# Patient Record
Sex: Male | Born: 1965
Health system: Southern US, Community
[De-identification: ages and names within clinical notes are randomized; demographics above are authoritative.]

## PROBLEM LIST (undated history)

## (undated) DIAGNOSIS — I270 Primary pulmonary hypertension: Secondary | ICD-10-CM

## (undated) DIAGNOSIS — I369 Nonrheumatic tricuspid valve disorder, unspecified: Secondary | ICD-10-CM

## (undated) DIAGNOSIS — N179 Acute kidney failure, unspecified: Secondary | ICD-10-CM

## (undated) DIAGNOSIS — F419 Anxiety disorder, unspecified: Secondary | ICD-10-CM

## (undated) DIAGNOSIS — I059 Rheumatic mitral valve disease, unspecified: Secondary | ICD-10-CM

## (undated) DIAGNOSIS — E119 Type 2 diabetes mellitus without complications: Secondary | ICD-10-CM

## (undated) DIAGNOSIS — K219 Gastro-esophageal reflux disease without esophagitis: Secondary | ICD-10-CM

## (undated) DIAGNOSIS — Z8679 Personal history of other diseases of the circulatory system: Secondary | ICD-10-CM

## (undated) DIAGNOSIS — E039 Hypothyroidism, unspecified: Secondary | ICD-10-CM

## (undated) DIAGNOSIS — I10 Essential (primary) hypertension: Secondary | ICD-10-CM

## (undated) DIAGNOSIS — Z8619 Personal history of other infectious and parasitic diseases: Secondary | ICD-10-CM

## (undated) DIAGNOSIS — L210 Seborrhea capitis: Secondary | ICD-10-CM

## (undated) HISTORY — DX: Acute kidney failure, unspecified: N17.9

## (undated) HISTORY — PX: MECKEL DIVERTICULUM EXCISION: SHX314

## (undated) HISTORY — DX: Personal history of other infectious and parasitic diseases: Z86.19

## (undated) HISTORY — PX: EYE SURGERY: SHX253

## (undated) HISTORY — DX: Personal history of other diseases of the circulatory system: Z86.79

## (undated) HISTORY — DX: Primary pulmonary hypertension: I27.0

## (undated) HISTORY — PX: REFRACTIVE SURGERY: SHX103

## (undated) HISTORY — DX: Rheumatic mitral valve disease, unspecified: I05.9

## (undated) HISTORY — DX: Nonrheumatic tricuspid valve disorder, unspecified: I36.9

## (undated) HISTORY — PX: HEMORROIDECTOMY: SUR656

## (undated) HISTORY — DX: Seborrhea capitis: L21.0

## (undated) HISTORY — PX: APPENDECTOMY: SHX54

---

## 1998-03-26 DIAGNOSIS — F410 Panic disorder [episodic paroxysmal anxiety] without agoraphobia: Secondary | ICD-10-CM | POA: Insufficient documentation

## 2006-01-13 DIAGNOSIS — H409 Unspecified glaucoma: Secondary | ICD-10-CM | POA: Insufficient documentation

## 2006-06-13 LAB — TSH: TSH: 1.72 u[IU]/mL (ref <=5.90)

## 2006-07-16 DIAGNOSIS — Z85828 Personal history of other malignant neoplasm of skin: Secondary | ICD-10-CM | POA: Insufficient documentation

## 2009-01-17 HISTORY — PX: OTHER SURGICAL HISTORY: SHX169

## 2009-06-14 HISTORY — PX: OTHER SURGICAL HISTORY: SHX169

## 2009-11-24 ENCOUNTER — Inpatient Hospital Stay: Payer: Self-pay | Admitting: Internal Medicine

## 2010-01-05 ENCOUNTER — Ambulatory Visit: Payer: Self-pay | Admitting: Family Medicine

## 2010-04-04 ENCOUNTER — Emergency Department: Payer: Self-pay | Admitting: Emergency Medicine

## 2010-04-09 ENCOUNTER — Emergency Department: Payer: Self-pay | Admitting: Emergency Medicine

## 2010-04-21 LAB — HEMOGLOBIN A1C: Hgb A1c MFr Bld: 10.4 % — AB (ref 4.0–6.0)

## 2010-04-21 LAB — BASIC METABOLIC PANEL
BUN: 33 mg/dL — AB (ref 4–21)
Creatinine: 4.2 mg/dL — AB (ref 0.6–1.3)
Glucose: 333 mg/dL
Potassium: 3.8 mmol/L (ref 3.4–5.3)
Sodium: 133 mmol/L — AB (ref 137–147)

## 2010-04-21 LAB — LIPID PANEL
Cholesterol: 205 mg/dL — AB (ref 0–200)
HDL: 43 mg/dL (ref 35–70)
LDL Cholesterol: 111 mg/dL
Triglycerides: 255 mg/dL — AB (ref 40–160)

## 2010-04-21 LAB — HEPATIC FUNCTION PANEL: ALT: 8 U/L — AB (ref 10–40)

## 2010-09-19 ENCOUNTER — Ambulatory Visit: Payer: Self-pay | Admitting: Vascular Surgery

## 2010-09-22 ENCOUNTER — Ambulatory Visit: Payer: Self-pay | Admitting: Vascular Surgery

## 2010-10-31 ENCOUNTER — Ambulatory Visit: Payer: Self-pay | Admitting: Nephrology

## 2010-11-06 ENCOUNTER — Ambulatory Visit: Payer: Self-pay | Admitting: Nephrology

## 2011-01-09 ENCOUNTER — Ambulatory Visit: Payer: Self-pay | Admitting: Family Medicine

## 2011-01-09 DIAGNOSIS — G4733 Obstructive sleep apnea (adult) (pediatric): Secondary | ICD-10-CM | POA: Insufficient documentation

## 2011-01-09 HISTORY — PX: OTHER SURGICAL HISTORY: SHX169

## 2011-05-09 ENCOUNTER — Ambulatory Visit: Payer: Self-pay | Admitting: Otolaryngology

## 2011-05-09 LAB — POTASSIUM: Potassium: 4.1 mmol/L (ref 3.5–5.1)

## 2011-05-14 LAB — PATHOLOGY REPORT

## 2011-10-30 DIAGNOSIS — Z201 Contact with and (suspected) exposure to tuberculosis: Secondary | ICD-10-CM | POA: Insufficient documentation

## 2011-12-21 ENCOUNTER — Ambulatory Visit (INDEPENDENT_AMBULATORY_CARE_PROVIDER_SITE_OTHER): Payer: Commercial Indemnity | Admitting: Cardiovascular Disease

## 2011-12-21 ENCOUNTER — Ambulatory Visit: Payer: Self-pay | Admitting: Specialist

## 2011-12-21 ENCOUNTER — Encounter: Payer: Self-pay | Admitting: Cardiovascular Disease

## 2011-12-21 VITALS — BP 138/78 | HR 100 | Ht 71.0 in | Wt 253.0 lb

## 2011-12-21 DIAGNOSIS — N186 End stage renal disease: Secondary | ICD-10-CM

## 2011-12-21 DIAGNOSIS — I6529 Occlusion and stenosis of unspecified carotid artery: Secondary | ICD-10-CM

## 2011-12-21 DIAGNOSIS — E785 Hyperlipidemia, unspecified: Secondary | ICD-10-CM

## 2011-12-21 DIAGNOSIS — E119 Type 2 diabetes mellitus without complications: Secondary | ICD-10-CM

## 2011-12-21 DIAGNOSIS — I10 Essential (primary) hypertension: Secondary | ICD-10-CM | POA: Insufficient documentation

## 2011-12-21 DIAGNOSIS — R079 Chest pain, unspecified: Secondary | ICD-10-CM

## 2011-12-21 DIAGNOSIS — R000 Tachycardia, unspecified: Secondary | ICD-10-CM | POA: Insufficient documentation

## 2011-12-21 MED ORDER — CARVEDILOL 6.25 MG PO TABS: 6.2500 mg | ORAL_TABLET | Freq: Two times a day (BID) | ORAL | Status: DC

## 2011-12-21 MED ORDER — NITROGLYCERIN 0.4 MG SL SUBL: 0.4000 mg | SUBLINGUAL_TABLET | SUBLINGUAL | Status: DC | PRN

## 2011-12-21 NOTE — Assessment & Plan Note (Signed)
Mild plaquing seen bilaterally. Will offer repeat ultrasound every several years

## 2011-12-21 NOTE — Assessment & Plan Note (Signed)
We have suggested he increase his carvedilol to 6.25 mg twice a day

## 2011-12-21 NOTE — Assessment & Plan Note (Signed)
Blood pressure is well controlled on today's visit. No changes made to the medications. 

## 2011-12-21 NOTE — Assessment & Plan Note (Signed)
Currently undergoing evaluation for renal transplant at [facility].

## 2011-12-21 NOTE — Assessment & Plan Note (Signed)
We have encouraged continued exercise, careful diet management in an effort to lose weight. 

## 2011-12-21 NOTE — Patient Instructions (Addendum)
You are doing well. Please increase the coreg to 6 mg twice a day Monitor your heart rate  Please call us if you have new issues that need to be addressed before your next appt.  Your physician wants you to follow-up in: 6 months.  You will receive a reminder letter in the mail two months in advance. If you don't receive a letter, please call our office to schedule the follow-up appointment.

## 2011-12-21 NOTE — Progress Notes (Signed)
Patient ID: James Moreno, male    DOB: October 13, 1965, 46 y.o.   MRN: [id number]  HPI Comments: James Moreno is a pleasant 46 year old gentleman with a history of diabetes, recurrent chest pain , no prior cardiac catheterization secondary to renal dysfunction hypertension , previous smoking history , hyperlipidemia,end-stage renal disease who is being evaluated for renal transplant at [facility] with recent stress test at [facility] earlier this year which was reportedly normal, who does peritoneal hemodialysis daily, who presents for evaluation of chest pain and to establish care.  He reports that several weeks ago, he was awoken from sleep with chest discomfort. Symptoms seemed to resolve without significant intervention. He has not had any further episodes since that time. He reports that he would like to go to the gym and start working out in an effort to lose weight.  He recently had an iron infusion He reports a decrease in his hemoglobin A1c from 8.8 to 7.3 He reports having chronic discomfort in his legs  Carotid studies done at an outside facility shows bilateral disease, mild, less than 39%  outside echocardiogram (done in [place])shows ejection fraction greater than 55%, severe concentric LVH, diastolic relaxation abnormality, mildly elevated right ventricular systolic pressures. No mention of LVH in the final summary    stress test in September 2000 was suboptimal as he was unable to achieve target heart rate   EKG shows normal sinus rhythm with rate 101 beats per minute, no significant ST-T wave changes   Outpatient Encounter Prescriptions as of 12/21/2011  Medication Sig Dispense Refill  . aspirin 81 MG tablet Take 81 mg by mouth daily.      . Calcium Acetate 667 MG TABS Take 667 mg by mouth 3 (three) times daily.      . cloNIDine (CATAPRES) 0.1 MG tablet Take 0.1 mg by mouth 2 (two) times daily as needed.       . insulin aspart protamine-insulin aspart (NOVOLOG 70/30) (70-30) 100 UNIT/ML  injection Inject 40 Units into the skin daily with supper.      . insulin glargine (LANTUS) 100 UNIT/ML injection Inject 50 Units into the skin at bedtime.      . insulin NPH (HUMULIN N,NOVOLIN N) 100 UNIT/ML injection Inject 60 Units into the skin at bedtime.      James Moreno Oil (SM MEGAKRILL PO) Take by mouth.      Marland Kitchen lisinopril (PRINIVIL,ZESTRIL) 5 MG tablet Take 5 mg by mouth 2 (two) times daily.      Marland Kitchen lovastatin (MEVACOR) 40 MG tablet Take 40 mg by mouth at bedtime.      Marland Kitchen omeprazole (PRILOSEC) 40 MG capsule Take 40 mg by mouth daily.      Marland Kitchen pyridOXINE (VITAMIN B-6) 100 MG tablet Take 100 mg by mouth daily.      . sevelamer (RENVELA) 800 MG tablet Take 800 mg by mouth 3 (three) times daily with meals.      . tadalafil (CIALIS) 5 MG tablet Take 5 mg by mouth daily as needed.      .  carvedilol (COREG) 3.125 MG tablet Take 3.125 mg by mouth 2 (two) times daily with a meal.      . nitroGLYCERIN (NITROSTAT) 0.4 MG SL tablet Place 1 tablet (0.4 mg total) under the tongue every 5 (five) minutes as needed for chest pain.  25 tablet  6     Review of Systems  Constitutional: Negative.   HENT: Negative.   Eyes: Negative.   Respiratory: Negative.  Cardiovascular: Positive for chest pain.  Gastrointestinal: Negative.   Musculoskeletal: Positive for myalgias and arthralgias.  Skin: Negative.   Neurological: Negative.   Hematological: Negative.   Psychiatric/Behavioral: Negative.   All other systems reviewed and are negative.    BP 138/78  Pulse 100  Ht 5\' 11"  (1.803 m)  Wt 253 lb (114.76 kg)  BMI 35.29 kg/m2  Physical Exam  Nursing note and vitals reviewed. Constitutional: He is oriented to person, place, and time. He appears well-developed and well-nourished.  HENT:  Head: Normocephalic.  Nose: Nose normal.  Mouth/Throat: Oropharynx is clear and moist.  Eyes: Conjunctivae normal are normal. Pupils are equal, round, and reactive to light.  Neck: Normal range of motion. Neck supple.  No JVD present.  Cardiovascular: Normal rate, regular rhythm, S1 normal, S2 normal, normal heart sounds and intact distal pulses.  Exam reveals no gallop and no friction rub.   No murmur heard. Pulmonary/Chest: Effort normal and breath sounds normal. No respiratory distress. He has no wheezes. He has no rales. He exhibits no tenderness.  Abdominal: Soft. Bowel sounds are normal. He exhibits no distension. There is no tenderness.       Peritoneal dialysis catheter in place  Musculoskeletal: Normal range of motion. He exhibits no edema and no tenderness.  Lymphadenopathy:    He has no cervical adenopathy.  Neurological: He is alert and oriented to person, place, and time. Coordination normal.  Skin: Skin is warm and dry. No rash noted. No erythema.  Psychiatric: He has a normal mood and affect. His behavior is normal. Judgment and thought content normal.           Assessment and Plan

## 2011-12-21 NOTE — Assessment & Plan Note (Signed)
Chest pain is somewhat atypical. No recent symptoms. Several prior stress test dating back to 2010, 2011 and 7 months ago at [facility]. No further testing at this time.

## 2011-12-21 NOTE — Assessment & Plan Note (Signed)
We have suggested he stay on his statin

## 2012-03-29 ENCOUNTER — Other Ambulatory Visit: Payer: Self-pay | Admitting: Cardiovascular Disease

## 2012-03-31 ENCOUNTER — Other Ambulatory Visit: Payer: Self-pay | Admitting: *Deleted

## 2012-03-31 MED ORDER — CARVEDILOL 6.25 MG PO TABS: 6.2500 mg | ORAL_TABLET | Freq: Two times a day (BID) | ORAL | Status: DC

## 2012-03-31 NOTE — Telephone Encounter (Signed)
Refilled Coreg sent to [facility].

## 2012-05-10 ENCOUNTER — Other Ambulatory Visit: Payer: Self-pay

## 2012-07-21 ENCOUNTER — Encounter: Payer: Self-pay | Admitting: Cardiovascular Disease

## 2012-07-21 ENCOUNTER — Ambulatory Visit (INDEPENDENT_AMBULATORY_CARE_PROVIDER_SITE_OTHER): Payer: Commercial Indemnity | Admitting: Cardiovascular Disease

## 2012-07-21 VITALS — BP 160/80 | HR 101 | Ht 71.0 in | Wt 261.0 lb

## 2012-07-21 DIAGNOSIS — N186 End stage renal disease: Secondary | ICD-10-CM

## 2012-07-21 DIAGNOSIS — E785 Hyperlipidemia, unspecified: Secondary | ICD-10-CM

## 2012-07-21 DIAGNOSIS — I129 Hypertensive chronic kidney disease with stage 1 through stage 4 chronic kidney disease, or unspecified chronic kidney disease: Secondary | ICD-10-CM

## 2012-07-21 DIAGNOSIS — E119 Type 2 diabetes mellitus without complications: Secondary | ICD-10-CM

## 2012-07-21 DIAGNOSIS — R000 Tachycardia, unspecified: Secondary | ICD-10-CM

## 2012-07-21 DIAGNOSIS — R079 Chest pain, unspecified: Secondary | ICD-10-CM

## 2012-07-21 DIAGNOSIS — I10 Essential (primary) hypertension: Secondary | ICD-10-CM

## 2012-07-21 MED ORDER — CARVEDILOL 25 MG PO TABS: 25.0000 mg | ORAL_TABLET | Freq: Two times a day (BID) | ORAL | Status: DC

## 2012-07-21 NOTE — Assessment & Plan Note (Signed)
He is indicated he is interested in gastric bypass surgery. Given his ability to exert himself without any symptoms of chest pain, shortness of breath, he would be acceptable risk. No further testing needed.

## 2012-07-21 NOTE — Assessment & Plan Note (Signed)
Currently on peritoneal dialysis daily

## 2012-07-21 NOTE — Assessment & Plan Note (Signed)
He denies any recent episodes of chest discomfort with exertion

## 2012-07-21 NOTE — Patient Instructions (Addendum)
You are doing well. Please increase the coreg to 25 mg twice a day  Please call us if you have new issues that need to be addressed before your next appt.  Your physician wants you to follow-up in: 6 months.  You will receive a reminder letter in the mail two months in advance. If you don't receive a letter, please call our office to schedule the follow-up appointment.

## 2012-07-21 NOTE — Assessment & Plan Note (Signed)
We have encouraged him to stay on his statin

## 2012-07-21 NOTE — Assessment & Plan Note (Signed)
We have encouraged continued exercise, careful diet management in an effort to lose weight. Gastric bypass surgery would certainly help his diabetes

## 2012-07-21 NOTE — Assessment & Plan Note (Signed)
Blood pressure slightly high today. We'll increase his carvedilol up to 25 mg twice a day. He'll continue to monitor his heart rate and blood pressure for [place].Marland Kitchen

## 2012-07-21 NOTE — Progress Notes (Signed)
Patient ID: James Moreno, male    DOB: 20-Jul-1965, 47 y.o.   MRN: [id number]  HPI Comments: James Moreno is a pleasant 47 year old gentleman with a history of diabetes, recurrent chest pain , no prior cardiac catheterization secondary to renal dysfunction hypertension , previous smoking history , hyperlipidemia,end-stage renal disease who is being evaluated for renal transplant at [facility] with recent stress test at [facility] earlier this year which was reportedly normal, who does peritoneal hemodialysis daily, previously evaluated for chest pain, presenting for routine followup.  Since his last clinic visit, he denies any recurrent chest pain. No chest pain with exertion and overall has been feeling well. Continues to do peritoneal dialysis daily. Dry weight 255 pounds. Smokes three quarters of a pack a day. Blood pressure at home is been well-controlled. He is interested in gastric bypass surgery to help improve his diabetes and sleep apnea.  reports that his heart rate continues to be elevated on a chronic basis    previously required iron infusions  Previous hemoglobin A1c was greater than 7 He reports having chronic discomfort in his legs  Carotid studies done at an outside facility shows bilateral disease, mild, less than 39%  outside echocardiogram (done in [place])shows ejection fraction greater than 55%, severe concentric LVH, diastolic relaxation abnormality, mildly elevated right ventricular systolic pressures. No mention of LVH in the final summary    stress test in September 2010 was suboptimal as he was unable to achieve target heart rate  EKG shows normal sinus rhythm with rate 101 beats per minute, no significant ST-T wave changes   Outpatient Encounter Prescriptions as of 07/21/2012  Medication Sig Dispense Refill  . aspirin 81 MG tablet Take 81 mg by mouth daily.      . Calcium Acetate 667 MG TABS Take 667 mg by mouth 3 (three) times daily.      . carvedilol (COREG) 25 MG tablet Take 1  tablet (25 mg total) by mouth 2 (two) times daily with a meal.  60 tablet  6  . insulin aspart protamine-insulin aspart (NOVOLOG 70/30) (70-30) 100 UNIT/ML injection Inject 60 Units into the skin daily with supper.       . insulin glargine (LANTUS) 100 UNIT/ML injection Inject 50 Units into the skin at bedtime.      . insulin NPH (HUMULIN N,NOVOLIN N) 100 UNIT/ML injection Inject 60 Units into the skin at bedtime.      [name] (SM MEGAKRILL PO) Take by mouth.      Marland Kitchen lisinopril (PRINIVIL,ZESTRIL) 5 MG tablet Take 5 mg by mouth 2 (two) times daily.      Marland Kitchen lovastatin (MEVACOR) 40 MG tablet Take 40 mg by mouth at bedtime.      Marland Kitchen omeprazole (PRILOSEC) 40 MG capsule Take 40 mg by mouth daily.      . sevelamer (RENVELA) 800 MG tablet Take 800 mg by mouth 3 (three) times daily with meals.      . tadalafil (CIALIS) 5 MG tablet Take 5 mg by mouth daily as needed.      . [DISCONTINUED] carvedilol (COREG) 6.25 MG tablet Take 1 tablet (6.25 mg total) by mouth 2 (two) times daily with a meal.  60 tablet  3  . [DISCONTINUED] cloNIDine (CATAPRES) 0.1 MG tablet Take 0.1 mg by mouth 2 (two) times daily as needed.       . [DISCONTINUED] nitroGLYCERIN (NITROSTAT) 0.4 MG SL tablet Place 1 tablet (0.4 mg total) under the tongue every 5 (five) minutes  as needed for chest pain.  25 tablet  6  . [DISCONTINUED] pyridOXINE (VITAMIN B-6) 100 MG tablet Take 100 mg by mouth daily.       No facility-administered encounter medications on file as of 07/21/2012.     Review of Systems  Constitutional: Negative.   HENT: Negative.   Eyes: Negative.   Respiratory: Negative.   Gastrointestinal: Negative.   Musculoskeletal: Positive for myalgias and arthralgias.  Skin: Negative.   Neurological: Negative.   Psychiatric/Behavioral: Negative.   All other systems reviewed and are negative.    BP 160/80  Pulse 101  Ht 5\' 11"  (1.803 m)  Wt 261 lb (118.389 kg)  BMI 36.42 kg/m2 Blood pressure improved on recheck down to  150/75  Physical Exam  Nursing note and vitals reviewed. Constitutional: He is oriented to person, place, and time. He appears well-developed and well-nourished.  HENT:  Head: Normocephalic.  Nose: Nose normal.  Mouth/Throat: Oropharynx is clear and moist.  Eyes: Conjunctivae are normal. Pupils are equal, round, and reactive to light.  Neck: Normal range of motion. Neck supple. No JVD present.  Cardiovascular: Normal rate, regular rhythm, S1 normal, S2 normal, normal heart sounds and intact distal pulses.  Exam reveals no gallop and no friction rub.   No murmur heard. Pulmonary/Chest: Effort normal and breath sounds normal. No respiratory distress. He has no wheezes. He has no rales. He exhibits no tenderness.  Abdominal: Soft. Bowel sounds are normal. He exhibits no distension. There is no tenderness.  Peritoneal dialysis catheter in place  Musculoskeletal: Normal range of motion. He exhibits no edema and no tenderness.  Lymphadenopathy:    He has no cervical adenopathy.  Neurological: He is alert and oriented to person, place, and time. Coordination normal.  Skin: Skin is warm and dry. No rash noted. No erythema.  Psychiatric: He has a normal mood and affect. His behavior is normal. Judgment and thought content normal.      Assessment and Plan

## 2012-11-17 DIAGNOSIS — N186 End stage renal disease: Secondary | ICD-10-CM | POA: Insufficient documentation

## 2012-11-28 ENCOUNTER — Other Ambulatory Visit: Payer: Self-pay

## 2012-11-28 ENCOUNTER — Ambulatory Visit (INDEPENDENT_AMBULATORY_CARE_PROVIDER_SITE_OTHER): Payer: Medicare Other | Admitting: Physician Assistant

## 2012-11-28 ENCOUNTER — Encounter: Payer: Self-pay | Admitting: Physician Assistant

## 2012-11-28 ENCOUNTER — Other Ambulatory Visit (INDEPENDENT_AMBULATORY_CARE_PROVIDER_SITE_OTHER): Payer: BC Managed Care – PPO

## 2012-11-28 VITALS — BP 138/82 | HR 97 | Ht 71.0 in | Wt 243.5 lb

## 2012-11-28 DIAGNOSIS — I658 Occlusion and stenosis of other precerebral arteries: Secondary | ICD-10-CM

## 2012-11-28 DIAGNOSIS — E1129 Type 2 diabetes mellitus with other diabetic kidney complication: Secondary | ICD-10-CM

## 2012-11-28 DIAGNOSIS — N529 Male erectile dysfunction, unspecified: Secondary | ICD-10-CM

## 2012-11-28 DIAGNOSIS — R079 Chest pain, unspecified: Secondary | ICD-10-CM

## 2012-11-28 DIAGNOSIS — N186 End stage renal disease: Secondary | ICD-10-CM

## 2012-11-28 DIAGNOSIS — E669 Obesity, unspecified: Secondary | ICD-10-CM

## 2012-11-28 DIAGNOSIS — E1121 Type 2 diabetes mellitus with diabetic nephropathy: Secondary | ICD-10-CM | POA: Insufficient documentation

## 2012-11-28 DIAGNOSIS — I10 Essential (primary) hypertension: Secondary | ICD-10-CM

## 2012-11-28 DIAGNOSIS — R0989 Other specified symptoms and signs involving the circulatory and respiratory systems: Secondary | ICD-10-CM

## 2012-11-28 DIAGNOSIS — I6529 Occlusion and stenosis of unspecified carotid artery: Secondary | ICD-10-CM

## 2012-11-28 DIAGNOSIS — N058 Unspecified nephritic syndrome with other morphologic changes: Secondary | ICD-10-CM

## 2012-11-28 DIAGNOSIS — I6523 Occlusion and stenosis of bilateral carotid arteries: Secondary | ICD-10-CM

## 2012-11-28 MED ORDER — CARVEDILOL 25 MG PO TABS: 12.5000 mg | ORAL_TABLET | Freq: Two times a day (BID) | ORAL | Status: DC

## 2012-11-28 MED ORDER — TADALAFIL 5 MG PO TABS: 5.0000 mg | ORAL_TABLET | Freq: Every day | ORAL | Status: DC | PRN

## 2012-11-28 NOTE — Progress Notes (Addendum)
Patient ID: James Moreno, male   DOB: 08/30/1965, 47 y.o.   MRN: [id number]            Date:  11/28/2012   ID:  James Moreno, DOB [birth year], MRN [id number]  PCP:  Carlyn Reichert, MD  Primary Cardiologist:  Johnny Bridge, MD   History of Present Illness:  [name] is a 47 y.o. male w/ PMHx s/f ESRD (PD daily, undergoing eval for renal transplant at [facility]), DM2 with diabetic nephropathy, HTN, HLD, tobacco abuse, nonobstructive carotid artery disease and OSA on CPAP who presents today for follow-up.  He last followed up in 06/2012. BP was mildly elevated. Carvedilol was increased to 25mg  BID. He mentioned an interest in pursing gastric bypass surgery. He was deemed to be at an acceptable risk by Dr. Rockey Situ w/o further testing at that time.   Carotid dopplers 05/2009: 1-39% bilateral ICA stenoses, antegrade vertebral artery flow  ETT Myoview 11/25/09: EF 74%, no evidence of ischemia, target HR not achieved d/t fatigue and leg pain  He reports significant progress in lifestyle modifications and overall health. He has decided to take control of his wellbeing. He has lost 20-30 lbs since he last followed up by eating better and exercising regularly. He performs aerobic and resistance exercises 2x/week with his wife. He eats fruit and protein shakes for breakfast, salads for lunch and steamed vegetables and grilled chicken for dinner. BP has come down significantly so much so that he has had to scale back on ACEi and BB therapy. Insulin regimen has been reduced. Hgb A1C ~ 2 weeks ago < 7.0% per patient. No longer smoking. Sexual dysfunction has improved. No longer needing CPAP. Energy is significantly improved. Denies chest pain or shortness of breath. No PND, orthopnea, LE edema, palpitations or syncope.   He has actively undergoing the process for renal transplant consideration. An updated echocardiogram is needed to complete this.   EKG: NSR, 87 bpm, isolated TWI, low voltage QRS in aVL,  otherwise no ST/T changes   Wt Readings from Last 3 Encounters:  11/28/12 243 lb 8 oz (110.451 kg)  07/21/12 261 lb (118.389 kg)  12/21/11 253 lb (114.76 kg)     Past Medical History  Diagnosis Date  . Unspecified essential hypertension   . Other and unspecified hyperlipidemia   . Type II or unspecified type diabetes mellitus without mention of complication, not stated as uncontrolled   . Primary pulmonary HTN   . Nonspecific abnormal electrocardiogram (ECG) (EKG)   . Proteinuria   . Unspecified disorder of kidney and ureter   . Mitral valve disorders   . Tricuspid valve disorders, specified as nonrheumatic   . Acute kidney failure, unspecified   . Carotid artery occlusion   . Peritoneal dialysis catheter in place     Current Outpatient Prescriptions  Medication Sig Dispense Refill  . aspirin 81 MG tablet Take 81 mg by mouth daily.      . Calcium Acetate 667 MG TABS Take 667 mg by mouth 3 (three) times daily.      . carvedilol (COREG) 25 MG tablet Take 1 tablet (25 mg total) by mouth 2 (two) times daily with a meal.  60 tablet  6  . insulin aspart protamine-insulin aspart (NOVOLOG 70/30) (70-30) 100 UNIT/ML injection Inject 60 Units into the skin daily with supper.       . insulin glargine (LANTUS) 100 UNIT/ML injection Inject 50 Units into the skin at bedtime.      . insulin NPH (  HUMULIN N,NOVOLIN N) 100 UNIT/ML injection Inject 60 Units into the skin at bedtime.      [name] (SM MEGAKRILL PO) Take by mouth.      Marland Kitchen lisinopril (PRINIVIL,ZESTRIL) 5 MG tablet Take 5 mg by mouth 2 (two) times daily.      Marland Kitchen lovastatin (MEVACOR) 40 MG tablet Take 40 mg by mouth at bedtime.      Marland Kitchen omeprazole (PRILOSEC) 40 MG capsule Take 40 mg by mouth daily.      . sevelamer (RENVELA) 800 MG tablet Take 800 mg by mouth 3 (three) times daily with meals.      . tadalafil (CIALIS) 5 MG tablet Take 5 mg by mouth daily as needed.       No current facility-administered medications for this visit.     Allergies:   No Known Allergies  Social History:  The patient  reports that he has quit smoking. His smoking use included Cigarettes. He has a 46.5 pack-year smoking history. He quit smokeless tobacco use about a year ago. He reports that he does not drink alcohol or use illicit drugs.   Family History:  Denies premature family h/o CAD.  Review of Systems: General: negative for chills, fever, night sweats or weight changes.  Cardiovascular: negative for chest pain, dyspnea on exertion, edema, orthopnea, palpitations, paroxysmal nocturnal dyspnea or shortness of breath Dermatological: negative for rash Respiratory: negative for cough or wheezing Urologic: negative for hematuria Abdominal: negative for nausea, vomiting, diarrhea, bright red blood per rectum, melena, or hematemesis Neurologic: negative for visual changes, syncope, or dizziness All other systems reviewed and are otherwise negative except as noted above.  PHYSICAL EXAM: VS:  BP 138/82  Pulse 97  Ht 5\' 11"  (1.803 m)  Wt 243 lb 8 oz (110.451 kg)  BMI 33.98 kg/m2 Well nourished, well developed, in no acute distress HEENT: normal, PERRL Neck: + bilateral carotid bruits, no JVD  Cardiac:  normal S1, S2; RRR; no murmur or gallops Lungs:  clear to auscultation bilaterally, no wheezing, rhonchi or rales Abd: + PD catheter noted, non-erythemous, non-tender, no evidence of discharge, soft, nontender, no hepatomegaly, normoactive BS x 4 quads Ext: no edema, cyanosis or clubbing Skin: warm and dry, cap refill < 2 sec Neuro:  CNs 2-12 intact, no focal abnormalities noted Musculoskeletal: strength and tone appropriate for age  Psych: pressured speech, anxious appearing

## 2012-11-28 NOTE — Assessment & Plan Note (Signed)
138/82 in the office today. Goal < 140/90 per new guidelines. Currently taking carvedilol 25mg  daily. Will adjust to 12.5mg  BID for more consistent effect.

## 2012-11-28 NOTE — Assessment & Plan Note (Signed)
Carotid bruits auscultated bilaterally on exam. Will update carotid doppler u/s.

## 2012-11-28 NOTE — Assessment & Plan Note (Deleted)
Auscultated bilaterally on exam. Will update carotid doppler u/s.

## 2012-11-28 NOTE — Patient Instructions (Addendum)
We will arrange an echocardiogram and doppler carotid ultrasound to be performed either in the office or as an outpatient at [facility].   These findings will be faxed to the renal service at [facility] before next Friday.   We will see you back in 3 months.

## 2012-11-28 NOTE — Assessment & Plan Note (Addendum)
Actively undergoing the process for renal transplantation at [facility]. Updated echocardiogram required. Will update. Echo scheduled for 4:00 PM in the office today. Fax results to Dr. Ailene Ravel at [facility]. The patient is asymptomatic from a cardiac perspective denying chest pain, SOB/DOE, LE edema, PND, orthopnea, abdominal distention, weight gain, palpitations or syncope. Clearly able to complete > 4 METs. Cardiac RFs well-controlled. Would be at acceptable pre-op risk w/o further testing.   ADDENDUM: EKG reveals NSR w/o ischemic changes. Unchanged from prior 06/2012 tracing.

## 2012-11-28 NOTE — Assessment & Plan Note (Signed)
BMI down to 33 today. He has lost ~ 20 lbs. Praised the patient on his lifestyle modifications. Comorbidities are better controlled. Recommended continuing this lifestyle and not reverting back to prior habits. He is willing and proactive in doing so.

## 2012-11-28 NOTE — Assessment & Plan Note (Signed)
Improved glycemic control. Continue insulin regimen, adjustments as needed and lifestyle modifications.

## 2012-11-29 DIAGNOSIS — N529 Male erectile dysfunction, unspecified: Secondary | ICD-10-CM | POA: Insufficient documentation

## 2012-11-29 NOTE — Assessment & Plan Note (Signed)
Will refill Cialis.

## 2012-12-03 ENCOUNTER — Encounter (INDEPENDENT_AMBULATORY_CARE_PROVIDER_SITE_OTHER): Payer: Medicare Other

## 2012-12-03 DIAGNOSIS — R0989 Other specified symptoms and signs involving the circulatory and respiratory systems: Secondary | ICD-10-CM

## 2012-12-03 DIAGNOSIS — I6529 Occlusion and stenosis of unspecified carotid artery: Secondary | ICD-10-CM

## 2012-12-10 ENCOUNTER — Encounter: Payer: Self-pay | Admitting: *Deleted

## 2012-12-19 ENCOUNTER — Telehealth: Payer: Self-pay

## 2012-12-19 NOTE — Telephone Encounter (Signed)
Spoke w/ pt.  He is aware of results.  States that he is still trying to quit smoking.

## 2012-12-19 NOTE — Telephone Encounter (Signed)
Message copied by Stana Bunting on Fri Dec 19, 2012  9:58 AM ------      Message from: Minna Merritts      Created: Thu Dec 18, 2012 10:13 PM       60 to 70% b/l carotid disease      Will need to repeat in 6 months ------

## 2012-12-19 NOTE — Telephone Encounter (Signed)
Message copied by Stana Bunting on Fri Dec 19, 2012  9:57 AM ------      Message from: Minna Merritts      Created: Thu Dec 18, 2012 10:13 PM       60 to 70% b/l carotid disease      Will need to repeat in 6 months ------

## 2013-01-29 ENCOUNTER — Other Ambulatory Visit: Payer: Self-pay

## 2013-02-17 ENCOUNTER — Telehealth: Payer: Self-pay

## 2013-02-17 ENCOUNTER — Other Ambulatory Visit: Payer: Self-pay

## 2013-02-17 ENCOUNTER — Other Ambulatory Visit: Payer: Self-pay | Admitting: *Deleted

## 2013-02-17 MED ORDER — TADALAFIL 5 MG PO TABS: 5.0000 mg | ORAL_TABLET | Freq: Every day | ORAL | Status: DC | PRN

## 2013-02-17 NOTE — Telephone Encounter (Signed)
Cialis sent to wrong pharmacy, needs to be sent [facility].

## 2013-02-17 NOTE — Telephone Encounter (Signed)
Requested Prescriptions   Signed Prescriptions Disp Refills  . tadalafil (CIALIS) 5 MG tablet 30 tablet 1    Sig: Take 1 tablet (5 mg total) by mouth daily as needed.    Authorizing Provider: Meriel Pica    Ordering User: Britt Bottom

## 2013-03-13 ENCOUNTER — Ambulatory Visit: Payer: BC Managed Care – PPO | Admitting: Cardiovascular Disease

## 2013-03-30 ENCOUNTER — Other Ambulatory Visit: Payer: Self-pay

## 2013-03-30 MED ORDER — CARVEDILOL 25 MG PO TABS: 12.5000 mg | ORAL_TABLET | Freq: Two times a day (BID) | ORAL | Status: DC

## 2013-03-30 NOTE — Telephone Encounter (Signed)
Refill sent for Coreg 25 mg 1/2 tablet twice a day to [facility].

## 2013-04-06 ENCOUNTER — Ambulatory Visit (INDEPENDENT_AMBULATORY_CARE_PROVIDER_SITE_OTHER): Payer: Medicare Other | Admitting: Cardiovascular Disease

## 2013-04-06 ENCOUNTER — Encounter: Payer: Self-pay | Admitting: Cardiovascular Disease

## 2013-04-06 VITALS — BP 142/86 | HR 92 | Ht 72.0 in | Wt 253.2 lb

## 2013-04-06 DIAGNOSIS — E669 Obesity, unspecified: Secondary | ICD-10-CM

## 2013-04-06 DIAGNOSIS — N186 End stage renal disease: Secondary | ICD-10-CM

## 2013-04-06 DIAGNOSIS — R000 Tachycardia, unspecified: Secondary | ICD-10-CM

## 2013-04-06 DIAGNOSIS — E785 Hyperlipidemia, unspecified: Secondary | ICD-10-CM

## 2013-04-06 DIAGNOSIS — R9431 Abnormal electrocardiogram [ECG] [EKG]: Secondary | ICD-10-CM

## 2013-04-06 DIAGNOSIS — I658 Occlusion and stenosis of other precerebral arteries: Secondary | ICD-10-CM

## 2013-04-06 DIAGNOSIS — I6523 Occlusion and stenosis of bilateral carotid arteries: Secondary | ICD-10-CM

## 2013-04-06 DIAGNOSIS — E119 Type 2 diabetes mellitus without complications: Secondary | ICD-10-CM

## 2013-04-06 DIAGNOSIS — I10 Essential (primary) hypertension: Secondary | ICD-10-CM

## 2013-04-06 DIAGNOSIS — I6529 Occlusion and stenosis of unspecified carotid artery: Secondary | ICD-10-CM

## 2013-04-06 NOTE — Progress Notes (Signed)
Patient ID: James Moreno, male    DOB: [birth year], 48 y.o.   MRN: [id number]  HPI Comments: James Moreno is a pleasant 48 year old gentleman with a history of diabetes, end-stage renal disease on the transplant list at [facility], hypertension , long smoking history , who does peritoneal hemodialysis daily in the evening, previously evaluated for chest pain, presenting for routine followup. PD monitored by [facility]   In followup today, he reports that he is doing well. Denies any shortness of breath or chest pain. He is exercising at the gym periodically . He is doing peritoneal dialysis in the evenings without complication although he is working with Dr.  Francoise Schaumann to monitor his sugars more closely. He is concerned that his sugars go high at nighttime while on peritoneal dialysis. He continues to smoke occasionally. Unclear how much she is smoking Weight has been relatively stable.   recent carotid ultrasound showed progression of his  carotid disease now up to 60-70% bilaterally   previously required iron infusions  Previous hemoglobin A1c was greater than 7  history of  chronic discomfort in his legs. Reports having ABIs with Dr. Francoise Schaumann  Echocardiogram over the past 6 months was essentially normal, normal ejection fraction estimated at greater than 55% . This was done in preparation for kidney transplant listing .    stress test in 2013 showed no ischemia, done at [facility]  EKG shows normal sinus rhythm with rate 92 beats per minute,  nonspecific ST and T wave abnormality in leads 3, aVF , minimal nonspecific ST changes in V5, V6    Outpatient Encounter Prescriptions as of 04/06/2013  Medication Sig  . aspirin 81 MG tablet Take 81 mg by mouth daily.  . Calcium Acetate 667 MG TABS Take 667 mg by mouth 3 (three) times daily.  . carvedilol (COREG) 25 MG tablet Take 0.5 tablets (12.5 mg total) by mouth 2 (two) times daily with a meal.  . insulin aspart (NOVOLOG) 100 UNIT/ML injection Inject 10  Units into the skin 2 (two) times daily.  . insulin glargine (LANTUS) 100 UNIT/ML injection Inject 50 Units into the skin at bedtime.  [name] (SM MEGAKRILL PO) Take by mouth.  Marland Kitchen lisinopril (PRINIVIL,ZESTRIL) 5 MG tablet Take 5 mg by mouth daily.   Marland Kitchen lovastatin (MEVACOR) 40 MG tablet Take 40 mg by mouth at bedtime.  Marland Kitchen omeprazole (PRILOSEC) 40 MG capsule Take 40 mg by mouth daily.  . sevelamer (RENVELA) 800 MG tablet Take 800 mg by mouth 3 (three) times daily with meals.  . tadalafil (CIALIS) 5 MG tablet Take 1 tablet (5 mg total) by mouth daily as needed.    Review of Systems  Constitutional: Negative.   HENT: Negative.   Eyes: Negative.   Respiratory: Negative.   Cardiovascular: Negative.   Gastrointestinal: Negative.   Endocrine: Negative.   Musculoskeletal: Positive for arthralgias and myalgias.  Skin: Negative.   Allergic/Immunologic: Negative.   Neurological: Negative.   Hematological: Negative.   Psychiatric/Behavioral: Negative.   All other systems reviewed and are negative.    BP 142/86  Pulse 92  Ht 6' (1.829 m)  Wt 253 lb 4 oz (114.873 kg)  BMI 34.34 kg/m2  Physical Exam  Nursing note and vitals reviewed. Constitutional: He is oriented to person, place, and time. He appears well-developed and well-nourished.  HENT:  Head: Normocephalic.  Nose: Nose normal.  Mouth/Throat: Oropharynx is clear and moist.  Eyes: Conjunctivae are normal. Pupils are equal, round, and reactive to light.  Neck: Normal range of motion. Neck supple. No JVD present.  Cardiovascular: Normal rate, regular rhythm, S1 normal, S2 normal, normal heart sounds and intact distal pulses.  Exam reveals no gallop and no friction rub.   No murmur heard. Pulmonary/Chest: Effort normal and breath sounds normal. No respiratory distress. He has no wheezes. He has no rales. He exhibits no tenderness.  Abdominal: Soft. Bowel sounds are normal. He exhibits no distension. There is no tenderness.   Peritoneal dialysis catheter in place  Musculoskeletal: Normal range of motion. He exhibits no edema and no tenderness.  Lymphadenopathy:    He has no cervical adenopathy.  Neurological: He is alert and oriented to person, place, and time. Coordination normal.  Skin: Skin is warm and dry. No rash noted. No erythema.  Psychiatric: He has a normal mood and affect. His behavior is normal. Judgment and thought content normal.      Assessment and Plan

## 2013-04-06 NOTE — Assessment & Plan Note (Signed)
Nonspecific ST and T wave abnormality  noted compared to prior EKGs. He is asymptomatic. Had a stress test within the past year. Exercising at the gym with no angina. we suggested he call office immediately if he has any symptoms concerning for angina including shortness of breath, fatigue, chest discomfort. If he has symptoms, may need a catheterization.

## 2013-04-06 NOTE — Assessment & Plan Note (Signed)
I am concerned about the progression of his bilateral carotid disease. We'll try to obtain his most recent lipid panel for our records. Strongly encouraged him to stop smoking. He is working with endocrinology for his diabetes.

## 2013-04-06 NOTE — Assessment & Plan Note (Addendum)
Suggested he closely monitor his blood pressure at home. No medication changes made

## 2013-04-06 NOTE — Patient Instructions (Signed)
You are doing well. No medication changes were made.  There is a change in your EKG Please call the office for any symptoms such as shortness of breath, chest pains, fatigue  Please call us if you have new issues that need to be addressed before your next appt.  Your physician wants you to follow-up in: 6 months.  You will receive a reminder letter in the mail two months in advance. If you don't receive a letter, please call our office to schedule the follow-up appointment.

## 2013-04-06 NOTE — Assessment & Plan Note (Signed)
We'll continue current dose of carvedilol for now

## 2013-04-06 NOTE — Assessment & Plan Note (Signed)
We have encouraged continued exercise, careful diet management in an effort to lose weight. 

## 2013-04-06 NOTE — Assessment & Plan Note (Signed)
On a transplant list with [facility]. Currently he does peritoneal dialysis

## 2013-04-06 NOTE — Assessment & Plan Note (Signed)
Followed by endocrinology. Encouraged him to start a weight loss program

## 2013-04-07 ENCOUNTER — Other Ambulatory Visit: Payer: Self-pay | Admitting: *Deleted

## 2013-04-07 MED ORDER — CARVEDILOL 25 MG PO TABS: 12.5000 mg | ORAL_TABLET | Freq: Two times a day (BID) | ORAL | Status: DC

## 2013-04-07 NOTE — Telephone Encounter (Signed)
[place] that he would like carvedilol sent to .

## 2013-04-07 NOTE — Telephone Encounter (Signed)
Patient is going out of town tomorrow and needs this stat!!! He apologizes for the late notice.

## 2013-04-07 NOTE — Telephone Encounter (Signed)
Requested Prescriptions   Signed Prescriptions Disp Refills  . carvedilol (COREG) 25 MG tablet 60 tablet 3    Sig: Take 0.5 tablets (12.5 mg total) by mouth 2 (two) times daily with a meal.    Authorizing Provider: Minna Merritts    Ordering User: Britt Bottom

## 2013-06-09 ENCOUNTER — Other Ambulatory Visit (HOSPITAL_COMMUNITY): Payer: Self-pay | Admitting: *Deleted

## 2013-06-09 DIAGNOSIS — I6529 Occlusion and stenosis of unspecified carotid artery: Secondary | ICD-10-CM

## 2013-06-19 ENCOUNTER — Encounter (INDEPENDENT_AMBULATORY_CARE_PROVIDER_SITE_OTHER): Payer: Medicare Other

## 2013-06-19 ENCOUNTER — Ambulatory Visit (INDEPENDENT_AMBULATORY_CARE_PROVIDER_SITE_OTHER): Payer: Medicare Other | Admitting: Physician Assistant

## 2013-06-19 VITALS — BP 138/80 | HR 93 | Ht 72.0 in | Wt 253.0 lb

## 2013-06-19 DIAGNOSIS — I6529 Occlusion and stenosis of unspecified carotid artery: Secondary | ICD-10-CM

## 2013-06-19 DIAGNOSIS — R079 Chest pain, unspecified: Secondary | ICD-10-CM

## 2013-06-19 MED ORDER — NITROGLYCERIN 0.4 MG SL SUBL: 0.4000 mg | SUBLINGUAL_TABLET | SUBLINGUAL | Status: DC | PRN

## 2013-06-19 NOTE — Patient Instructions (Addendum)
Please take nitroglycerin as prescribed- as needed for chest pain. If you develop chest pain despite 2-3 nitroglycerins, please call our office or go to the nearest emergency department.  Do not take within 24 hours of taking Cialis. This can cause a drop in blood pressure.  We will schedule a stress test for next Thursday or Friday.   I will go over your information further with Dr. Rockey Situ, and if he has a strong preference to change the plan, he will be in contact with you.  [place]  Your caregiver has ordered a Stress Test with nuclear imaging. The purpose of this test is to evaluate the blood supply to your heart muscle. This procedure is referred to as a "Non-Invasive Stress Test." This is because other than having an IV started in your vein, nothing is inserted or "invades" your body. Cardiac stress tests are done to find areas of poor blood flow to the heart by determining the extent of coronary artery disease (CAD). Some patients exercise on a treadmill, which naturally increases the blood flow to your heart, while others who are  unable to walk on a treadmill due to physical limitations have a pharmacologic/chemical stress agent called Lexiscan . This medicine will mimic walking on a treadmill by temporarily increasing your coronary blood flow.   Please note: these test may take anywhere between 2-4 hours to complete  PLEASE REPORT TO [place] AT THE FIRST DESK WILL DIRECT YOU WHERE TO GO  Date of Procedure:________Thursday, April 2______________  Arrival Time for Procedure:________7:15am________________  Instructions regarding medication:     __X__:  Hold betablocker(s) night before procedure and morning of procedure: CARVEDILOL   PLEASE NOTIFY THE OFFICE AT LEAST 24 HOURS IN ADVANCE IF YOU ARE UNABLE TO KEEP YOUR APPOINTMENT.  [phone] AND  PLEASE NOTIFY NUCLEAR MEDICINE AT [facility] AT LEAST 24 HOURS IN ADVANCE IF YOU ARE UNABLE TO KEEP  YOUR APPOINTMENT. [phone]  How to prepare for your Myoview test:  1. Do not eat or drink after midnight 2. No caffeine for 24 hours prior to test 3. No smoking 24 hours prior to test. 4. Your medication may be taken with water.  If your doctor stopped a medication because of this test, do not take that medication. 5. Ladies, please do not wear dresses.  Skirts or pants are appropriate. Please wear a short sleeve shirt. 6. No perfume, cologne or lotion. 7. Wear comfortable walking shoes. No heels!

## 2013-06-19 NOTE — Progress Notes (Signed)
Patient ID: James Moreno, male   DOB: Feb 11, 1966, 48 y.o.   MRN: [id number]   Date:  06/19/2013   ID:  James Moreno, DOB 1966/03/13, MRN [id number]  PCP:  Carlyn Reichert, MD  Primary Cardiologist:  Johnny Bridge, MD   History of Present Illness:  James Moreno is a 48 y.o. male w/ PMHx s/f ESRD (undergoes PD daily; on transplant list at [facility]), DM2 with diabetic nephropathy, HTN, HLD, ongoing tobacco abuse, carotid artery disease (70-80% bilaterally), claudication/PAD (diagnosed by Dr. Sabino Niemann by ABI) and OSA (on CPAP) who presents today for c/o chest pain.  He presented initially for an EKG after endorsing intermittent chest pain. The tracing revealed downsloping/flat ST depressions III, aVF- actually improved from prior tracing.   He describes experiencing intermittent, substernal chest tightness aggravated by exertion over the past week. No radiation or associated symptoms. He does report occasional chest "twinges" at rest which are similar in quality. No NTG at home. Per last office note, he had a stress test last year which was normal. He continues to smoke. He continues to make urine despite being dialysis dependent. He is very cautious about his remaining renal functionality. He has been enrolled in the transplant list at [facility] and is undergoing the process at [facility].  EKG: NSR, 93 bpm, downsloping/flat ST depressions (2mm) III, aVF, improved from prior tracing  Wt Readings from Last 3 Encounters:  06/19/13 253 lb (114.76 kg)  04/06/13 253 lb 4 oz (114.873 kg)  11/28/12 243 lb 8 oz (110.451 kg)     Past Medical History  Diagnosis Date  . Unspecified essential hypertension   . Other and unspecified hyperlipidemia   . Type II or unspecified type diabetes mellitus without mention of complication, not stated as uncontrolled   . Primary pulmonary HTN   . Nonspecific abnormal electrocardiogram (ECG) (EKG)   . Proteinuria   . Unspecified disorder of kidney and ureter   . Mitral valve  disorders   . Tricuspid valve disorders, specified as nonrheumatic   . Acute kidney failure, unspecified   . Carotid artery occlusion   . Peritoneal dialysis catheter in place   . PAD (peripheral artery disease)     left leg     Current Outpatient Prescriptions  Medication Sig Dispense Refill  . aspirin 81 MG tablet Take 81 mg by mouth daily.      . Calcium Acetate 667 MG TABS Take 667 mg by mouth 3 (three) times daily.      . carvedilol (COREG) 25 MG tablet Take 0.5 tablets (12.5 mg total) by mouth 2 (two) times daily with a meal.  60 tablet  3  . insulin aspart (NOVOLOG) 100 UNIT/ML injection Inject 10 Units into the skin 2 (two) times daily.      . insulin glargine (LANTUS) 100 UNIT/ML injection Inject 50 Units into the skin at bedtime.      James Moreno Oil (SM MEGAKRILL PO) Take by mouth.      Marland Kitchen lisinopril (PRINIVIL,ZESTRIL) 5 MG tablet Take 5 mg by mouth 2 (two) times daily.       Marland Kitchen lovastatin (MEVACOR) 40 MG tablet Take 40 mg by mouth at bedtime.      Marland Kitchen omeprazole (PRILOSEC) 40 MG capsule Take 40 mg by mouth daily.      . sevelamer (RENVELA) 800 MG tablet Take 800 mg by mouth 3 (three) times daily with meals.      . nitroGLYCERIN (NITROSTAT) 0.4 MG SL tablet Place 1 tablet (0.4 mg  total) under the tongue every 5 (five) minutes as needed for chest pain.  25 tablet  3   No current facility-administered medications for this visit.    Allergies:   No Known Allergies  Social History:  The patient  reports that he has quit smoking. His smoking use included Cigarettes. He has a 46.5 pack-year smoking history. He quit smokeless tobacco use about 18 months ago. He reports that he does not drink alcohol or use illicit drugs.   Family History:  Family History  Problem Relation Age of Onset  . Family history unknown: Yes    Review of Systems: General: negative for chills, fever, night sweats or weight changes.  Cardiovascular: positive for chest pain, negative for dyspnea on exertion,  edema, orthopnea, palpitations, paroxysmal nocturnal dyspnea or shortness of breath Dermatological: negative for rash Respiratory: negative for cough or wheezing Urologic: negative for hematuria Abdominal: negative for nausea, vomiting, diarrhea, bright red blood per rectum, melena, or hematemesis Neurologic: negative for visual changes, syncope, or dizziness All other systems reviewed and are otherwise negative except as noted above.  PHYSICAL EXAM: VS:  BP 138/80  Pulse 93  Ht 6' (1.829 m)  Wt 253 lb (114.76 kg)  BMI 34.31 kg/m2 Well nourished, well developed, in no acute distress HEENT: normal, PERRL Neck: no JVD or bruits Cardiac:  normal S1, S2; RRR; no murmur or gallops Lungs:  clear to auscultation bilaterally, no wheezing, rhonchi or rales Abd: soft, mildly distending, PD catheter noted, no erythema, nontender, no discharge, no hepatomegaly, normoactive BS x 4 quads Ext: no edema, cyanosis or clubbing Skin: warm and dry, cap refill < 2 sec Neuro:  CNs 2-12 intact, no focal abnormalities noted Musculoskeletal: strength and tone appropriate for age  Psych: normal affect

## 2013-06-20 DIAGNOSIS — R0789 Other chest pain: Secondary | ICD-10-CM | POA: Insufficient documentation

## 2013-06-20 NOTE — Assessment & Plan Note (Addendum)
The patient describes exertional substernal chest tightness occurring over the last month. He does endorse intermittent "twinges" of similar discomfort at rest. Last Myoview > 12 months ago revealed no evidence of ischemia. Cardiac RFs include DM2 w/ sequelae, HTN, HLD, obesity, ongoing tobacco abuse. He has multiple vasculopathies in PVD and carotid artery disease. Symptoms are concerning for angina. Advanced kidney disease precludes cardiac catheterization. He is adamant on maintaining renal function. He is dialysis dependent, but does still produce urine. Will plan to proceed with Lexiscan Myoview next week. This will satisfy as an updated ischemic eval for renal transplant candidacy at [facility] as well. Add NTG SL PRN in the meantime. Advised to avoid strenuous exertion until then and if symptoms become more frequent, prolonged, severe or occurring mostly at rest, present to the nearest ED.

## 2013-06-21 ENCOUNTER — Emergency Department: Payer: Self-pay | Admitting: Emergency Medicine

## 2013-06-21 LAB — COMPREHENSIVE METABOLIC PANEL
Albumin: 3.1 g/dL — ABNORMAL LOW (ref 3.4–5.0)
Alkaline Phosphatase: 138 U/L — ABNORMAL HIGH
Anion Gap: 11 (ref 7–16)
BUN: 55 mg/dL — ABNORMAL HIGH (ref 7–18)
Bilirubin,Total: 0.4 mg/dL (ref 0.2–1.0)
Calcium, Total: 7.3 mg/dL — ABNORMAL LOW (ref 8.5–10.1)
Chloride: 100 mmol/L (ref 98–107)
Co2: 21 mmol/L (ref 21–32)
Creatinine: 9.88 mg/dL — ABNORMAL HIGH (ref 0.60–1.30)
EGFR (African American): 6 — ABNORMAL LOW
EGFR (Non-African Amer.): 6 — ABNORMAL LOW
Glucose: 196 mg/dL — ABNORMAL HIGH (ref 65–99)
Osmolality: 285 (ref 275–301)
Potassium: 4.1 mmol/L (ref 3.5–5.1)
SGOT(AST): 13 U/L — ABNORMAL LOW (ref 15–37)
SGPT (ALT): 20 U/L (ref 12–78)
Sodium: 132 mmol/L — ABNORMAL LOW (ref 136–145)
Total Protein: 6.8 g/dL (ref 6.4–8.2)

## 2013-06-21 LAB — CBC
HCT: 28.9 % — ABNORMAL LOW (ref 40.0–52.0)
HGB: 10.1 g/dL — ABNORMAL LOW (ref 13.0–18.0)
MCH: 32.8 pg (ref 26.0–34.0)
MCHC: 34.9 g/dL (ref 32.0–36.0)
MCV: 94 fL (ref 80–100)
Platelet: 175 10*3/uL (ref 150–440)
RBC: 3.07 10*6/uL — ABNORMAL LOW (ref 4.40–5.90)
RDW: 13 % (ref 11.5–14.5)
WBC: 9.7 10*3/uL (ref 3.8–10.6)

## 2013-06-21 LAB — URINALYSIS, COMPLETE
Bilirubin,UR: NEGATIVE
Glucose,UR: >=500 mg/dL (ref 0–75)
Ketone: NEGATIVE
Leukocyte Esterase: NEGATIVE
Nitrite: NEGATIVE
Ph: 6 (ref 4.5–8.0)
Protein: >=500
RBC,UR: 1 /HPF (ref 0–5)
Specific Gravity: 1.008 (ref 1.003–1.030)
Squamous Epithelial: NONE SEEN
WBC UR: 1 /HPF (ref 0–5)

## 2013-06-21 LAB — LIPASE, BLOOD: Lipase: 217 U/L (ref 73–393)

## 2013-06-23 LAB — URINE CULTURE

## 2013-06-25 ENCOUNTER — Other Ambulatory Visit: Payer: Self-pay

## 2013-06-25 ENCOUNTER — Ambulatory Visit: Payer: Self-pay | Admitting: Cardiovascular Disease

## 2013-06-25 DIAGNOSIS — R079 Chest pain, unspecified: Secondary | ICD-10-CM

## 2013-06-25 LAB — BODY FLUID CULTURE

## 2013-06-26 LAB — CULTURE, BLOOD (SINGLE)

## 2013-07-01 ENCOUNTER — Telehealth: Payer: Self-pay | Admitting: *Deleted

## 2013-07-01 NOTE — Telephone Encounter (Signed)
Stress test was normal Good cardiac function, no ischemia

## 2013-07-01 NOTE — Telephone Encounter (Signed)
Patient called wanting stress test results. 

## 2013-07-02 ENCOUNTER — Telehealth: Payer: Self-pay

## 2013-07-02 NOTE — Telephone Encounter (Signed)
Faxed results of pt's lexiscan myoview to  Dr. Ailene Rud, [facility] [place], [facility] [phone]

## 2013-07-02 NOTE — Telephone Encounter (Signed)
Reviewed results w/ pt.   He asks that we send results to Dr. Ailene Rud at [facility] at fax # [phone]. He also asks that we send to [facility] transplant clinic, but he does not contact info for them.

## 2013-07-17 ENCOUNTER — Telehealth: Payer: Self-pay

## 2013-07-17 NOTE — Telephone Encounter (Signed)
Pt would like stress test results.  

## 2013-07-17 NOTE — Telephone Encounter (Signed)
Spoke w/ pt.  Reviewed results again w/ pt and advised him that I had sent these results to the transplant clinics that he requested.  Asked pt to call w/ further questions or concerns.

## 2013-09-29 NOTE — Telephone Encounter (Signed)
This encounter was created in error - please disregard.

## 2013-11-05 ENCOUNTER — Emergency Department: Payer: Self-pay | Admitting: Emergency Medicine

## 2013-11-27 ENCOUNTER — Other Ambulatory Visit (HOSPITAL_COMMUNITY): Payer: Self-pay | Admitting: *Deleted

## 2013-11-27 DIAGNOSIS — I6529 Occlusion and stenosis of unspecified carotid artery: Secondary | ICD-10-CM

## 2013-12-09 ENCOUNTER — Ambulatory Visit: Payer: Medicare Other | Admitting: Cardiovascular Disease

## 2013-12-22 ENCOUNTER — Ambulatory Visit (INDEPENDENT_AMBULATORY_CARE_PROVIDER_SITE_OTHER): Payer: Medicare Other | Admitting: Cardiovascular Disease

## 2013-12-22 ENCOUNTER — Encounter (INDEPENDENT_AMBULATORY_CARE_PROVIDER_SITE_OTHER): Payer: Medicare Other

## 2013-12-22 ENCOUNTER — Encounter: Payer: Self-pay | Admitting: Cardiovascular Disease

## 2013-12-22 VITALS — BP 132/80 | HR 85 | Ht 72.0 in | Wt 226.5 lb

## 2013-12-22 DIAGNOSIS — I151 Hypertension secondary to other renal disorders: Secondary | ICD-10-CM

## 2013-12-22 DIAGNOSIS — N2889 Other specified disorders of kidney and ureter: Secondary | ICD-10-CM

## 2013-12-22 DIAGNOSIS — I6529 Occlusion and stenosis of unspecified carotid artery: Secondary | ICD-10-CM

## 2013-12-22 DIAGNOSIS — I658 Occlusion and stenosis of other precerebral arteries: Secondary | ICD-10-CM

## 2013-12-22 DIAGNOSIS — E785 Hyperlipidemia, unspecified: Secondary | ICD-10-CM

## 2013-12-22 DIAGNOSIS — R079 Chest pain, unspecified: Secondary | ICD-10-CM

## 2013-12-22 DIAGNOSIS — I150 Renovascular hypertension: Secondary | ICD-10-CM

## 2013-12-22 DIAGNOSIS — I6523 Occlusion and stenosis of bilateral carotid arteries: Secondary | ICD-10-CM

## 2013-12-22 DIAGNOSIS — E1159 Type 2 diabetes mellitus with other circulatory complications: Secondary | ICD-10-CM

## 2013-12-22 DIAGNOSIS — N186 End stage renal disease: Secondary | ICD-10-CM

## 2013-12-22 NOTE — Progress Notes (Signed)
Patient ID: James Moreno, male    DOB: 1965-12-15, 48 y.o.   MRN: [id number]  HPI Comments: James Moreno is a pleasant 48 year old gentleman with a history of diabetes, end-stage renal disease on the transplant list at [facility], hypertension , long smoking history , who does peritoneal hemodialysis daily in the evening, previously evaluated for chest pain, presenting for routine followup. PD monitored by [facility]  He presents for routine followup Bilateral carotid disease,  60-70% bilaterally Repeat carotid ultrasound done today in the office, result not available  He reports that he is being listed for renal transplant through the [place] system. He is already listed for renal transplant through [facility]. To be listed through [place] system, he had to repeat a stress test. He has an echocardiogram scheduled. He had EKG done through their office. He reports that stress test showed no ischemia. Overall he feels well, has lost significant weight over the past year. His weight is down 35 pounds, he is off insulin, hemoglobin A1c has significantly improved. Overall he feels well. He continues to do paracardial dialysis. Denies any fluid retention.   He is exercising at the gym periodically . He does have a history of smoking   previously required iron infusions   history of  chronic discomfort in his legs. Reports having ABIs with Dr. Francoise Schaumann  Echocardiogram over the past 6 months was essentially normal, normal ejection fraction estimated at greater than 55% . This was done in preparation for kidney transplant listing .    stress test in 2013 showed no ischemia, done at [facility], repeat stress test in the past month or so at [place] system  Previous EKG showed normal sinus rhythm with rate 92 beats per minute,  nonspecific ST and T wave abnormality in leads 3, aVF , minimal nonspecific ST changes in V5, V6  No EKG done today as he reports having EKG done through [facility] system. EKG has been requested in addition to his other records including a stress test   Outpatient Encounter Prescriptions as of 12/22/2013  Medication Sig  . aspirin 81 MG tablet Take 81 mg by mouth daily.  . Calcium Acetate 667 MG TABS Take 667 mg by mouth 3 (three) times daily.  . carvedilol (COREG) 25 MG tablet Take 0.5 tablets (12.5 mg total) by mouth 2 (two) times daily with a meal.  . insulin glargine (LANTUS) 100 UNIT/ML injection Inject 50 Units into the skin at bedtime.  James Moreno Oil (SM MEGAKRILL PO) Take by mouth.  . lovastatin (MEVACOR) 40 MG tablet Take 40 mg by mouth at bedtime.  . sevelamer (RENVELA) 800 MG tablet Take 800 mg by mouth 3 (three) times daily with meals.  . [DISCONTINUED] insulin aspart (NOVOLOG) 100 UNIT/ML injection Inject 10 Units into the skin 2 (two) times daily.  . [DISCONTINUED] lisinopril (PRINIVIL,ZESTRIL) 5 MG tablet Take 5 mg by mouth 2 (two) times daily.   . [DISCONTINUED] nitroGLYCERIN (NITROSTAT) 0.4 MG SL tablet Place 1 tablet (0.4 mg total) under the tongue every 5 (five) minutes as needed for chest pain.  . [DISCONTINUED] omeprazole (PRILOSEC) 40 MG capsule Take 40 mg by mouth daily.    Review of Systems  Constitutional: Negative.   HENT: Negative.   Eyes: Negative.   Respiratory: Negative.   Cardiovascular: Negative.   Gastrointestinal: Negative.   Endocrine: Negative.   Musculoskeletal: Positive for arthralgias and myalgias.  Skin: Negative.   Allergic/Immunologic: Negative.   Neurological: Negative.  Hematological: Negative.   Psychiatric/Behavioral: Negative.   All other systems reviewed and are negative.   BP 132/80  Ht 6' (1.829 m)  Wt 226 lb 8 oz (102.74 kg)  BMI 30.71 kg/m2  Physical Exam  Nursing note and vitals reviewed. Constitutional: He is oriented to person, place, and time. He appears well-developed and well-nourished.  HENT:  Head: Normocephalic.  Nose: Nose normal.  Mouth/Throat: Oropharynx is clear  and moist.  Eyes: Conjunctivae are normal. Pupils are equal, round, and reactive to light.  Neck: Normal range of motion. Neck supple. No JVD present.  Cardiovascular: Normal rate, regular rhythm, S1 normal, S2 normal, normal heart sounds and intact distal pulses.  Exam reveals no gallop and no friction rub.   No murmur heard. Pulmonary/Chest: Effort normal and breath sounds normal. No respiratory distress. He has no wheezes. He has no rales. He exhibits no tenderness.  Abdominal: Soft. Bowel sounds are normal. He exhibits no distension. There is no tenderness.  Peritoneal dialysis catheter in place  Musculoskeletal: Normal range of motion. He exhibits no edema and no tenderness.  Lymphadenopathy:    He has no cervical adenopathy.  Neurological: He is alert and oriented to person, place, and time. Coordination normal.  Skin: Skin is warm and dry. No rash noted. No erythema.  Psychiatric: He has a normal mood and affect. His behavior is normal. Judgment and thought content normal.      Assessment and Plan

## 2013-12-22 NOTE — Assessment & Plan Note (Signed)
He denies any recent episodes of chest pain.

## 2013-12-22 NOTE — Patient Instructions (Signed)
You are doing well. No medication changes were made.  Please call us if you have new issues that need to be addressed before your next appt.  Your physician wants you to follow-up in: 6 months.  You will receive a reminder letter in the mail two months in advance. If you don't receive a letter, please call our office to schedule the follow-up appointment.   

## 2013-12-22 NOTE — Assessment & Plan Note (Signed)
Currently listed for renal transplant at [facility]. Undergoing evaluation at [place] system for listing. We have requested that her records including stress testing done recently

## 2013-12-22 NOTE — Assessment & Plan Note (Signed)
Cholesterol is at goal on the current lipid regimen. No changes to the medications were made.  

## 2013-12-22 NOTE — Assessment & Plan Note (Signed)
Blood pressure is well controlled on today's visit. No changes made to the medications. 

## 2013-12-22 NOTE — Assessment & Plan Note (Signed)
Weight and diabetes has significantly improved over the past year. He reports hemoglobin A1c in the 6 range

## 2013-12-22 NOTE — Assessment & Plan Note (Signed)
Moderate to severe bilateral carotid arterial disease. Repeat carotid completed today, results not available

## 2014-01-21 ENCOUNTER — Other Ambulatory Visit: Payer: Self-pay | Admitting: Physician Assistant

## 2014-02-09 ENCOUNTER — Other Ambulatory Visit (HOSPITAL_COMMUNITY): Payer: Self-pay | Admitting: *Deleted

## 2014-02-09 DIAGNOSIS — R079 Chest pain, unspecified: Secondary | ICD-10-CM

## 2014-02-09 DIAGNOSIS — Z01818 Encounter for other preprocedural examination: Secondary | ICD-10-CM

## 2014-02-12 ENCOUNTER — Other Ambulatory Visit (INDEPENDENT_AMBULATORY_CARE_PROVIDER_SITE_OTHER): Payer: Medicare Other

## 2014-02-12 ENCOUNTER — Other Ambulatory Visit: Payer: Self-pay

## 2014-02-12 DIAGNOSIS — Z01818 Encounter for other preprocedural examination: Secondary | ICD-10-CM

## 2014-02-12 DIAGNOSIS — R079 Chest pain, unspecified: Secondary | ICD-10-CM

## 2014-03-04 ENCOUNTER — Other Ambulatory Visit: Payer: Self-pay | Admitting: Cardiovascular Disease

## 2014-03-26 HISTORY — PX: KIDNEY TRANSPLANT: SHX239

## 2014-04-09 ENCOUNTER — Other Ambulatory Visit: Payer: Self-pay | Admitting: Cardiovascular Disease

## 2014-04-12 ENCOUNTER — Other Ambulatory Visit: Payer: Self-pay

## 2014-04-12 MED ORDER — CARVEDILOL 25 MG PO TABS: ORAL_TABLET | ORAL | Status: DC

## 2014-05-27 ENCOUNTER — Telehealth: Payer: Self-pay | Admitting: *Deleted

## 2014-05-27 NOTE — Telephone Encounter (Signed)
Pt c/o medication issue:  1. Name of Medication: carvedilol   2. How are you currently taking this medication (dosage and times per day)?   3. Are you having a reaction (difficulty breathing--STAT)? No   4. What is your medication issue? amalodapine and lisinopril used to take these, would like to take this.  Three to four days been high.  157/74 yesterday 200/90 this morning

## 2014-05-27 NOTE — Telephone Encounter (Signed)
Spoke w/ pt.  He reports that he is on dialysis, followed by Dr. Holley Raring & Dr. Caryn Section. Pt was previously on carvedilol BID and amlodipine, but after a significant wt loss, his BP was dropping to 110/50-60 w/ feeling lightheaded.  Pt gained 20 lbs, is now at 230, BP went up to 130/80-90, for the past week 200/100. Pt would like to go back on lisinopril 5 mg and amlodipine, though he does not know the dose.  Advised him that our records do not that has taken amlodipine, he reports that [facility] have been adjusting his meds.  Pt is calling us, as Dr. Holley Raring is out of the office. He states that he will call Dr. Caryn Section, but would like to know Dr. Donivan Scull opinion, as he does not feel that he can wait until the next available appt on 06/10/14.

## 2014-05-28 NOTE — Telephone Encounter (Signed)
Spoke w/ pt.  He reports that he saw his PCP and was started on carvedilol, which seems to be working for him.  He has a f/u w/ Dr. Caryn Section in 3 weeks and will call us back if we can be of further assistance.

## 2014-07-18 NOTE — Op Note (Signed)
PATIENT NAME:  James Moreno, James Moreno MR#:  [id number] DATE OF BIRTH:  [birth year]  DATE OF PROCEDURE:  05/09/2011  PREOPERATIVE DIAGNOSIS: Lesion of left tonsillar pillar.   POSTOPERATIVE DIAGNOSIS: Lesion of left tonsillar pillar.   PROCEDURE PERFORMED: Excision of lesion left posterior tonsillar pillar.   SURGEON: Malon Kindle, MD  ANESTHESIA: General endotracheal.   INDICATIONS: This is 49 year old male with a lesion on the left side of the throat. On limited physical examination, due to his extreme gag reflex, it appeared to be possibly on the left anterior tonsillar pillar, however, under anesthesia it was more involving the posterior tonsillar pillar. This was an exophytic papillary lesion about 8 mm in greatest dimension involving the left posterior tonsillar pillar with no deep invasion. No other lesions were seen involving the oropharynx, tongue, floor of mouth, or palate.   COMPLICATIONS: None.   DESCRIPTION OF PROCEDURE: After obtaining informed consent, the patient was taken to the Operating Room and placed in the supine position. After induction of general endotracheal anesthesia, the patient was turned 90 degrees and the head draped with the eyes protected. A McIvor retractor was used to open the mouth. The oral cavity was carefully inspected. Prior to putting the McIvor retractor in, I also inspected the tongue carefully using a tongue blade. The only lesion seen was that involving the left posterior tonsillar pillar. This was an exophytic papillary appearing lesion consistent with a papilloma. It was grasped with forceps and excised with the margin at its base using cut current on the Bovie. Minor bleeding was controlled with the Bovie. The base of the lesion was injected with 0.25% Marcaine with epinephrine 1:200,000. The oropharynx was again carefully inspected looking at the lips, buccal mucosa, palate, tongue, floor of mouth, both tonsils, and up under the uvula, and no other lesions  were seen. The patient had already had a laryngoscopy in the office to rule out laryngeal papilloma or any lesions of the    hypopharynx or tongue base. He was returned to the anesthesiologist for awakening. He was awakened and taken to the Recovery Room in good condition postoperatively. Blood loss was minimal.  ____________________________ Sammuel Hines. Richardson Landry, MD psb:slb D: 05/09/2011 10:37:19 ET T: 05/09/2011 11:25:20 ET JOB#: [id number]  cc: Sammuel Hines. Richardson Landry, MD, <Dictator> Riley Nearing MD ELECTRONICALLY SIGNED 05/18/2011 17:59

## 2014-07-28 ENCOUNTER — Telehealth: Payer: Self-pay | Admitting: *Deleted

## 2014-07-28 NOTE — Telephone Encounter (Signed)
Pt had kidney transplant, and he was discharged from there Sunday, and they took all his medication and then gave it back but forgot to give back his BP meds  1. Which medications need to be refilled? Carvedilol  and Amilopine   2. Which pharmacy is medication to be sent to? [facility]   3. Do they need a 30 day or 90 day supply? 90  4. Would they like a call back once the medication has been sent to the pharmacy? No

## 2014-07-28 NOTE — Telephone Encounter (Signed)
Spoke w/ pt.  He reports that Dr. Caryn Section restarted his amlodipine & carvedilol.  He will contact their office for refills and call us back w/ any further questions or concerns.

## 2014-07-28 NOTE — Telephone Encounter (Signed)
Pt requesting refill on amlodipine 10 mg he takes 1 tablet by mouth daily. Pt is aware that amlodipine is not on medication list. Pt would also like to refill carvedilol 25 mg tablet which he mentioned he takes 1 tablet by mouth daily. Pt has no complaints or issues concerning BP at this time. Please verify correct instructions & if ok to refill?

## 2014-08-30 ENCOUNTER — Other Ambulatory Visit: Payer: Self-pay | Admitting: Family Medicine

## 2014-08-30 ENCOUNTER — Ambulatory Visit (INDEPENDENT_AMBULATORY_CARE_PROVIDER_SITE_OTHER): Payer: 59 | Admitting: Family Medicine

## 2014-08-30 ENCOUNTER — Encounter: Payer: Self-pay | Admitting: Family Medicine

## 2014-08-30 ENCOUNTER — Telehealth: Payer: Self-pay | Admitting: *Deleted

## 2014-08-30 VITALS — BP 110/60 | HR 92 | Temp 98.6°F | Resp 16 | Wt 227.0 lb

## 2014-08-30 DIAGNOSIS — R109 Unspecified abdominal pain: Secondary | ICD-10-CM

## 2014-08-30 DIAGNOSIS — Z1389 Encounter for screening for other disorder: Secondary | ICD-10-CM

## 2014-08-30 MED ORDER — OXYCODONE HCL 7.5 MG PO TABS: 1.0000 | ORAL_TABLET | ORAL | Status: DC

## 2014-08-30 MED ORDER — OXYCODONE HCL 5 MG PO TABS: 5.0000 mg | ORAL_TABLET | ORAL | Status: DC | PRN

## 2014-08-30 MED ORDER — OXYCODONE HCL 5 MG PO TABA: 1.0000 | ORAL_TABLET | ORAL | Status: DC

## 2014-08-30 NOTE — Addendum Note (Signed)
Addended by: Birdie Sons on: 08/30/2014 05:41 PM   Modules accepted: Orders

## 2014-08-30 NOTE — Progress Notes (Signed)
   Patient: James Moreno Male    DOB: 12-Jul-1965   49 y.o.   MRN: [id number] Visit Date: 08/30/2014  Today's Provider: Lelon Huh, MD   Chief Complaint  Patient presents with  . Medication Refill   Subjective:    HPI    He states he started having much more severe pain at sight of seroma underneath surgical excision about 2 days ago. He has had this pain off and on since having kidney transplant and has gone to [place] a few times for evaluation. He has been prescribed dilauded for this previously, but is now out. He also states oxycodone works well. He has been taking high dose acetaminophen over the weekend,but states it upsets his stomach. He has follow up in [place] at the end of the week.   Previous Medications   ASPIRIN 81 MG TABLET    Take 81 mg by mouth daily.   CALCIUM ACETATE 667 MG TABS    Take 667 mg by mouth 3 (three) times daily.   CARVEDILOL (COREG) 25 MG TABLET    TAKE 1/2 BY MOUTH TWO TIMESDAILY WITH A MEAL   CARVEDILOL (COREG) 25 MG TABLET    TAKE ONE-HALF TABLET BY MOUTH TWICE DAILY WITH MEALS   INSULIN GLARGINE (LANTUS) 100 UNIT/ML INJECTION    Inject 50 Units into the skin at bedtime.   KRILL OIL (SM MEGAKRILL PO)    Take by mouth.   LOVASTATIN (MEVACOR) 40 MG TABLET    Take 40 mg by mouth at bedtime.   SEVELAMER (RENVELA) 800 MG TABLET    Take 800 mg by mouth 3 (three) times daily with meals.    Review of Systems  Cardiovascular: Negative for palpitations and leg swelling.  Neurological: Negative for dizziness and light-headedness.    History  Substance Use Topics  . Smoking status: Former Smoker -- 1.50 packs/day for 31 years    Types: Cigarettes  . Smokeless tobacco: Former Systems developer    Quit date: 11/26/2011     Comment: Quit 3 months ago  . Alcohol Use: No   Objective:   BP 110/60 mmHg  Pulse 92  Temp(Src) 98.6 F (37 C) (Oral)  Resp 16  Wt 227 lb (102.967 kg)  SpO2 98%  Physical Exam   General Appearance:    Alert, cooperative, no  distress  Eyes:    PERRL, conjunctiva/corneas clear, EOM's intact       Lungs:     Clear to auscultation bilaterally, respirations unlabored  Heart:    Regular rate and rhythm  Neurologic:   Awake, alert, oriented x 3. No apparent focal neurological           defect.   Abdomen   Obese, tender and indurated under incision of RLQ. No drainage, no erythema.            Assessment & Plan:     1. Abdominal pain, unspecified abdominal location Secondary to post-surgical seroma. No sign of infection. He is to follow up with nephrology as scheduled in [place] at the end of the week and may take occasional oxycodone 7.5mg  as needed until then.

## 2014-08-30 NOTE — Telephone Encounter (Signed)
Amy from Transplant medical facility in [place] called to let us know that pt has been calling their office all weekend complaining of pain and requesting oxycontin. Patient was advised that he needed to be seen by their office it he was in that much pain because their could be underlining causes associated to pt's recent kidney transplant. Patient refused to be seen at the transplant office.

## 2014-09-02 ENCOUNTER — Telehealth: Payer: Self-pay | Admitting: *Deleted

## 2014-09-02 NOTE — Telephone Encounter (Signed)
Screening colonoscopy is not recommended until age 50.

## 2014-09-02 NOTE — Telephone Encounter (Signed)
Pt called stating he needs to be referred to have a colonoscopy done. Please advise

## 2014-09-07 ENCOUNTER — Other Ambulatory Visit: Payer: Self-pay | Admitting: Family Medicine

## 2014-09-07 ENCOUNTER — Telehealth: Payer: Self-pay | Admitting: *Deleted

## 2014-09-07 NOTE — Telephone Encounter (Signed)
Lmom for patient to call our office due for a carotid u/s (1 yr f/u).

## 2014-09-07 NOTE — Telephone Encounter (Signed)
We usually refer to Dr. Allen Norris after the age of 44 for screening colonoscopies. Insurance will not cover this until after  the age of 27.

## 2014-09-07 NOTE — Telephone Encounter (Signed)
Advised pt that screening colonoscopy is not recommended until age 50. Patient stated that he wanted to know who (GI Phyician) you would refer him to. Patient also stated that he wants to get checked out thoroughly since he had his kidney transplant. Patient stated that he wants to make sure there is nothing else wrong with him.

## 2014-09-08 ENCOUNTER — Ambulatory Visit: Payer: Medicare Other | Admitting: Cardiovascular Disease

## 2014-09-09 NOTE — Telephone Encounter (Signed)
Patient notified. Pt will wait until he turns 49.

## 2014-09-20 ENCOUNTER — Telehealth: Payer: Self-pay

## 2014-09-20 MED ORDER — CARVEDILOL 3.125 MG PO TABS: 3.1250 mg | ORAL_TABLET | Freq: Two times a day (BID) | ORAL | Status: DC

## 2014-09-20 NOTE — Telephone Encounter (Signed)
Pt coming in for EKG.

## 2014-09-20 NOTE — Telephone Encounter (Signed)
Pt called states his BP has been 110/70, and he is afraid to take carvedilol, states his HR was 135 this morning, he has a question whether he should take this. Please advise

## 2014-09-20 NOTE — Telephone Encounter (Signed)
Spoke w/ pt.  He reports that he is s/p kidney transplant. He has lost wt and is down from 240 to 210-215. Pt reports that after his transplant, he contracted the CMV virus and has been bedridden for the past 3 weeks due to diarrhea & flu like sx. Reports that his amlodipine was d/c'd due to low BP and was advised to hold his carvedilol as well. Since this change, reports his HR in the 120-140 range, 135 this am.  Advised pt that Christell Faith, PA is standing next to me reviewing his chart as I am recording his sx. He advises that pt resume carvedilol, but at lower dose of 3.125 mg BID, hold if SBP < 100 and to increase his fluids. He is appreciative and will call back if his HR does not return to normal.

## 2014-09-28 ENCOUNTER — Other Ambulatory Visit: Payer: Self-pay | Admitting: Family Medicine

## 2014-09-28 NOTE — Telephone Encounter (Signed)
Pt contacted office for refill request on the following medications:  OMEPRAZOLE  CAP 68M.  90 day supply.  [place].  Phone [phone].  [id number]

## 2014-09-28 NOTE — Telephone Encounter (Signed)
Looks like Dr. Caryn Section has never prescribed this medication. I checked [employer] EMR an saw that patient was on Omeprazole 40mg  daily but this was entered as an historical medication that was not prescribed by Dr. Caryn Section. Patient was advised of this and he states he would look on the bottle when he got home and contact the prescriber.

## 2014-10-11 ENCOUNTER — Other Ambulatory Visit: Payer: Self-pay | Admitting: Family Medicine

## 2014-10-12 ENCOUNTER — Telehealth: Payer: Self-pay | Admitting: Family Medicine

## 2014-10-12 NOTE — Telephone Encounter (Signed)
Having a problem getting his sildenafil (REVATIO) 20 MG tablet [facility] told him they had faxed an order over.  Can we either call him at [phone] or fill his his RX.    Thanks Con Memos

## 2014-10-12 NOTE — Telephone Encounter (Signed)
Rx sent 10/11/2014, pharmacy confirmed that have received rx.

## 2014-10-13 LAB — HM HIV SCREENING LAB: HM HIV Screening: NEGATIVE

## 2014-11-16 DIAGNOSIS — E11319 Type 2 diabetes mellitus with unspecified diabetic retinopathy without macular edema: Secondary | ICD-10-CM | POA: Diagnosis not present

## 2014-11-16 DIAGNOSIS — Z5329 Procedure and treatment not carried out because of patient's decision for other reasons: Secondary | ICD-10-CM | POA: Diagnosis not present

## 2014-11-16 DIAGNOSIS — Z940 Kidney transplant status: Secondary | ICD-10-CM | POA: Diagnosis not present

## 2014-11-16 DIAGNOSIS — I10 Essential (primary) hypertension: Secondary | ICD-10-CM | POA: Diagnosis not present

## 2014-11-16 NOTE — OR Nursing (Signed)
Cleared by Dr Parke Poisson transplant nephrologist

## 2014-11-17 ENCOUNTER — Encounter
Admission: RE | Disposition: A | Discharge status: Home / Self Care | Payer: Self-pay | Source: Ambulatory Visit | Attending: Ophthalmology

## 2014-11-17 ENCOUNTER — Encounter: Payer: Self-pay | Admitting: Anesthesiology

## 2014-11-17 ENCOUNTER — Encounter: Payer: Self-pay | Admitting: *Deleted

## 2014-11-17 ENCOUNTER — Ambulatory Visit
Admission: RE | Admit: 2014-11-17 | Discharge: 2014-11-17 | Disposition: A | Discharge status: Home / Self Care | Payer: Medicare Other | Source: Ambulatory Visit | Attending: Ophthalmology | Admitting: Ophthalmology

## 2014-11-17 DIAGNOSIS — E11319 Type 2 diabetes mellitus with unspecified diabetic retinopathy without macular edema: Secondary | ICD-10-CM | POA: Diagnosis not present

## 2014-11-17 DIAGNOSIS — Z5329 Procedure and treatment not carried out because of patient's decision for other reasons: Secondary | ICD-10-CM | POA: Insufficient documentation

## 2014-11-17 DIAGNOSIS — I10 Essential (primary) hypertension: Secondary | ICD-10-CM | POA: Insufficient documentation

## 2014-11-17 DIAGNOSIS — Z940 Kidney transplant status: Secondary | ICD-10-CM | POA: Insufficient documentation

## 2014-11-17 LAB — GLUCOSE, CAPILLARY: Glucose-Capillary: 417 mg/dL — ABNORMAL HIGH (ref 65–99)

## 2014-11-17 SURGERY — PARS PLANA VITRECTOMY WITH 25 GAUGE
Anesthesia: Choice | Laterality: Left

## 2014-11-17 MED ORDER — HYALURONIDASE HUMAN 150 UNIT/ML IJ SOLN
INTRAMUSCULAR | Status: AC
  Filled 2014-11-17: qty 1

## 2014-11-17 MED ORDER — CYCLOPENTOLATE HCL 2 % OP SOLN: 1.0000 [drp] | OPHTHALMIC | Status: DC | PRN

## 2014-11-17 MED ORDER — HYPROMELLOSE 0.3 % OP GEL
OPHTHALMIC | Status: AC
  Filled 2014-11-17: qty 3.5

## 2014-11-17 MED ORDER — PHENYLEPHRINE HCL 10 % OP SOLN: 1.0000 [drp] | OPHTHALMIC | Status: DC | PRN

## 2014-11-17 MED ORDER — BUPIVACAINE HCL (PF) 0.75 % IJ SOLN
INTRAMUSCULAR | Status: AC
  Filled 2014-11-17: qty 10

## 2014-11-17 MED ORDER — TRIAMCINOLONE ACETONIDE 40 MG/ML IJ SUSP
INTRAMUSCULAR | Status: AC
  Filled 2014-11-17: qty 1

## 2014-11-17 MED ORDER — DEXAMETHASONE SODIUM PHOSPHATE 10 MG/ML IJ SOLN
INTRAMUSCULAR | Status: AC
  Filled 2014-11-17: qty 1

## 2014-11-17 MED ORDER — INDOCYANINE GREEN 25 MG IV SOLR
25.0000 mg | Freq: Once | INTRAVENOUS | Status: DC
  Filled 2014-11-17: qty 25

## 2014-11-17 MED ORDER — ATROPINE SULFATE 1 % OP SOLN
OPHTHALMIC | Status: AC
  Filled 2014-11-17: qty 5

## 2014-11-17 MED ORDER — CYCLOPENTOLATE HCL 2 % OP SOLN
OPHTHALMIC | Status: AC
  Filled 2014-11-17: qty 2

## 2014-11-17 MED ORDER — PHENYLEPHRINE HCL 10 % OP SOLN
OPHTHALMIC | Status: AC
  Filled 2014-11-17: qty 5

## 2014-11-17 MED ORDER — NEOMYCIN-POLYMYXIN-DEXAMETH 3.5-10000-0.1 OP OINT
TOPICAL_OINTMENT | OPHTHALMIC | Status: AC
  Filled 2014-11-17: qty 3.5

## 2014-11-17 MED ORDER — BSS PLUS IO SOLN
Freq: Once | INTRAOCULAR | Status: DC
  Filled 2014-11-17: qty 500

## 2014-11-17 MED ORDER — SODIUM CHLORIDE 0.9 % IV SOLN: INTRAVENOUS | Status: DC

## 2014-11-17 MED ORDER — LIDOCAINE HCL (PF) 4 % IJ SOLN
INTRAMUSCULAR | Status: AC
  Filled 2014-11-17: qty 5

## 2014-11-17 SURGICAL SUPPLY — 26 items
APPLICATOR COTTON TIP 6IN STRL (MISCELLANEOUS) IMPLANT
CANNULA SOFT TIP 25G (CANNULA) IMPLANT
CORD BIP STRL DISP 12FT (MISCELLANEOUS) IMPLANT
CUP MEDICINE 2OZ PLAST GRAD ST (MISCELLANEOUS) IMPLANT
ERASER HMR WETFIELD 25G (MISCELLANEOUS) IMPLANT
FORCEPS GRIESH GRASP 25G (INSTRUMENTS) IMPLANT
FORCEPS GRIESH ILM PLUS 25G (INSTRUMENTS) IMPLANT
GLOVE BIO SURGEON STRL SZ8 (GLOVE) IMPLANT
GLOVE SURG LX 6.5 MICRO (GLOVE)
GLOVE SURG LX STRL 6.5 MICRO (GLOVE) IMPLANT
GOWN STRL REUS W/ TWL LRG LVL3 (GOWN DISPOSABLE) IMPLANT
GOWN STRL REUS W/TWL LRG LVL3 (GOWN DISPOSABLE)
LENS BIOM OPTIC SET 200MM DISP (MISCELLANEOUS) IMPLANT
LENS VITRECTOMY FLAT DISP (MISCELLANEOUS) IMPLANT
NDL RETROBULBAR .5 NSTRL (NEEDLE) IMPLANT
NEEDLE FILTER BLUNT 18X 1/2SAF (NEEDLE)
NEEDLE FILTER BLUNT 18X1 1/2 (NEEDLE) IMPLANT
PACK EYE AFTER SURG (MISCELLANEOUS) IMPLANT
PACK VITRECTOMY (MISCELLANEOUS) IMPLANT
PACK VITRECTOMY CASSETTE 25GA (MISCELLANEOUS) IMPLANT
PROBE DIRECTIONAL LASER (MISCELLANEOUS) IMPLANT
PROBE LASER ILLUM FLEX CVD 25G (OPHTHALMIC) IMPLANT
SOL PREP PVP 2OZ (MISCELLANEOUS)
SOLUTION PREP PVP 2OZ (MISCELLANEOUS) IMPLANT
STRAP SAFETY BODY (MISCELLANEOUS) IMPLANT
SYRINGE 10CC LL (SYRINGE) IMPLANT

## 2014-11-17 NOTE — Anesthesia Preprocedure Evaluation (Deleted)
Anesthesia Evaluation    Airway        Dental   Pulmonary former smoker,           Cardiovascular hypertension,      Neuro/Psych    GI/Hepatic   Endo/Other  diabetes  Renal/GU      Musculoskeletal   Abdominal   Peds  Hematology   Anesthesia Other Findings   Reproductive/Obstetrics                             Anesthesia Physical Anesthesia Plan Anesthesia Quick Evaluation  

## 2014-11-17 NOTE — Brief Op Note (Signed)
Per anesthesia and preop nurses patient with very high blood sugar (495) and very anxious.  Recently started on prednisone for low WBC's.  Patient cancelled and left the building prior to my arrival at the hospital.  See anesthesia note by Dr. Benjamine Mola for more details.

## 2014-11-17 NOTE — Progress Notes (Signed)
preop fsbs 417 - Dr Rosey Bath (on call) called and notified of same 0615 - advises pt will be seen by anesthesia doc for that OR room when they get in.  Dr Oval Linsey paged re same.

## 2014-11-17 NOTE — Progress Notes (Signed)
Dr Benjamine Mola in to see pt 0705 - discussed blood sugar with pt - pt advises he is leaving the hospital.

## 2014-11-17 NOTE — Progress Notes (Signed)
Call returned by Dr Oval Linsey at [phone] - aware of events, notified her that patient anxious and left room to "go to truck to get my meds".  Per Dr Oval Linsey, start eye drops when patient gets back in room.

## 2014-11-22 ENCOUNTER — Encounter: Payer: Self-pay | Admitting: Cardiovascular Disease

## 2014-11-22 ENCOUNTER — Ambulatory Visit (INDEPENDENT_AMBULATORY_CARE_PROVIDER_SITE_OTHER): Payer: Medicare Other | Admitting: Cardiovascular Disease

## 2014-11-22 VITALS — BP 118/60 | HR 102 | Ht 71.5 in | Wt 206.8 lb

## 2014-11-22 DIAGNOSIS — E1159 Type 2 diabetes mellitus with other circulatory complications: Secondary | ICD-10-CM

## 2014-11-22 DIAGNOSIS — I6529 Occlusion and stenosis of unspecified carotid artery: Secondary | ICD-10-CM

## 2014-11-22 DIAGNOSIS — E785 Hyperlipidemia, unspecified: Secondary | ICD-10-CM

## 2014-11-22 DIAGNOSIS — N2889 Other specified disorders of kidney and ureter: Secondary | ICD-10-CM | POA: Diagnosis not present

## 2014-11-22 DIAGNOSIS — E1121 Type 2 diabetes mellitus with diabetic nephropathy: Secondary | ICD-10-CM

## 2014-11-22 DIAGNOSIS — I151 Hypertension secondary to other renal disorders: Secondary | ICD-10-CM | POA: Diagnosis not present

## 2014-11-22 DIAGNOSIS — R000 Tachycardia, unspecified: Secondary | ICD-10-CM

## 2014-11-22 NOTE — Patient Instructions (Signed)
You are doing well. No medication changes were made.  We will schedule a carotid ultrasound for carotid stenosis  Please call us if you have new issues that need to be addressed before your next appt.  Your physician wants you to follow-up in: 12 months.  You will receive a reminder letter in the mail two months in advance. If you don't receive a letter, please call our office to schedule the follow-up appointment.

## 2014-11-22 NOTE — Assessment & Plan Note (Signed)
Significantly improved hemoglobin A1c after kidney transplant, now down to less than 6 per the patient

## 2014-11-22 NOTE — Assessment & Plan Note (Signed)
Cholesterol is at goal on the current lipid regimen. No changes to the medications were made.  

## 2014-11-22 NOTE — Progress Notes (Signed)
Patient ID: James Moreno, male    DOB: [birth year], 49 y.o.   MRN: [id number]  HPI Comments: Mr. Biter is a pleasant 49 year old gentleman with a history of diabetes, kidney transplant April 2016 in [place], hypertension , long smoking history who continues to smoke, previously on peritoneal hemodialysis, p evaluated for chest pain in the past, presenting for routine followup of his PAD.  Bilateral carotid disease,  60-70% bilaterally in 2015  In follow-up, he reports having a difficult time after his kidney transplant.. Period of infection, leg cramps, unable to tolerate prednisone secondary to leg cramping. Currently doing well and feels great. Going back-to-school, works in Investment banker, operational Hemoglobin A1c 5.9, creatinine 1.7 He continues to smoke one half pack per day Heart rate elevated today, typically runs 80-90, currently 100 Kidney issues followed by Dr. Holley Raring Weight is down significantly from before the transplant  EKG on today's visit shows sinus tachycardia with rate 102 bpm, no significant ST or T-wave changes  Other past medical history  previously required iron infusions   history of  chronic discomfort in his legs. Reports having ABIs with Dr. Francoise Schaumann  Echocardiogram over the past 6 months was essentially normal, normal ejection fraction estimated at greater than 55% . This was done in preparation for kidney transplant listing .    stress test in 2013 showed no ischemia, done at [facility], repeat stress test in the past month or so at [place] [facility]  No Known Allergies  Current Outpatient Prescriptions on File Prior to Visit  Medication Sig Dispense Refill  . aspirin 81 MG tablet Take 81 mg by mouth daily.    . carvedilol (COREG) 3.125 MG tablet Take 1 tablet (3.125 mg total) by mouth 2 (two) times daily. 60 tablet 6  . insulin aspart (NOVOLOG) 100 UNIT/ML injection Inject 25 Units into the skin 3 (three) times daily before meals.    . lovastatin (MEVACOR) 40 MG  tablet Take 40 mg by mouth at bedtime.    . mycophenolate (MYFORTIC) 180 MG EC tablet Take 180 mg by mouth 2 (two) times daily.    Marland Kitchen omeprazole (PRILOSEC) 20 MG capsule Take 20 mg by mouth daily.    Marland Kitchen oxyCODONE (OXY IR/ROXICODONE) 5 MG immediate release tablet Take 1 tablet (5 mg total) by mouth every 4 (four) hours as needed for severe pain. 30 tablet 0  . sulfamethoxazole-trimethoprim (BACTRIM,SEPTRA) 400-80 MG per tablet Take 1 tablet by mouth 2 (two) times daily.    . tacrolimus (PROGRAF) 5 MG capsule Take 5 mg by mouth 2 (two) times daily.     No current facility-administered medications on file prior to visit.    Past Medical History  Diagnosis Date  . Unspecified essential hypertension   . Other and unspecified hyperlipidemia   . Type II or unspecified type diabetes mellitus without mention of complication, not stated as uncontrolled   . Primary pulmonary HTN   . Nonspecific abnormal electrocardiogram (ECG) (EKG)   . Proteinuria   . Unspecified disorder of kidney and ureter   . Mitral valve disorders   . Tricuspid valve disorders, specified as nonrheumatic   . Acute kidney failure, unspecified   . Carotid artery occlusion   . Peritoneal dialysis catheter in place   . PAD (peripheral artery disease)     left leg     Past Surgical History  Procedure Laterality Date  . Eye surgery      right  . Hemorroidectomy    . Appendectomy    .  Refractive surgery Left   . Kidney transplant      Social History  reports that he has been smoking Cigarettes.  He has a 46.5 pack-year smoking history. He quit smokeless tobacco use about 2 years ago. He reports that he does not drink alcohol or use illicit drugs.  Family History family history includes Hyperlipidemia in his mother; Hypertension in his mother.  Review of Systems  Constitutional: Negative.   Respiratory: Negative.   Cardiovascular: Negative.   Gastrointestinal: Negative.   Musculoskeletal: Positive for myalgias and  arthralgias.  Skin: Negative.   Neurological: Negative.   Hematological: Negative.   Psychiatric/Behavioral: Negative.   All other systems reviewed and are negative.   BP 118/60 mmHg  Pulse 102  Ht 5' 11.5" (1.816 m)  Wt 206 lb 12 oz (93.781 kg)  BMI 28.44 kg/m2  Physical Exam  Constitutional: He is oriented to person, place, and time. He appears well-developed and well-nourished.  Healing surgical sites from his transplant  HENT:  Head: Normocephalic.  Nose: Nose normal.  Mouth/Throat: Oropharynx is clear and moist.  Eyes: Conjunctivae are normal. Pupils are equal, round, and reactive to light.  Neck: Normal range of motion. Neck supple. No JVD present.  Cardiovascular: Normal rate, regular rhythm, S1 normal, S2 normal, normal heart sounds and intact distal pulses.  Exam reveals no gallop and no friction rub.   No murmur heard. Pulmonary/Chest: Effort normal and breath sounds normal. No respiratory distress. He has no wheezes. He has no rales. He exhibits no tenderness.  Abdominal: Soft. Bowel sounds are normal. He exhibits no distension. There is no tenderness.  Peritoneal dialysis catheter in place  Musculoskeletal: Normal range of motion. He exhibits no edema or tenderness.  Lymphadenopathy:    He has no cervical adenopathy.  Neurological: He is alert and oriented to person, place, and time. Coordination normal.  Skin: Skin is warm and dry. No rash noted. No erythema.  Psychiatric: He has a normal mood and affect. His behavior is normal. Judgment and thought content normal.      Assessment and Plan   Nursing note and vitals reviewed.

## 2014-11-22 NOTE — Assessment & Plan Note (Signed)
Blood pressure is well controlled on today's visit. No changes made to the medications. 

## 2014-11-22 NOTE — Assessment & Plan Note (Signed)
Heart rate slightly elevated today. Suggested he monitor his heart rate at home. If this continues to run high, could change to metoprolol, higher dose Suspect secondary to effects from recent transplant surgery

## 2014-11-22 NOTE — Assessment & Plan Note (Signed)
Moderate to severe bilateral carotid arterial disease. Repeat carotid u/s ordered

## 2014-11-25 ENCOUNTER — Telehealth: Payer: Self-pay | Admitting: Family Medicine

## 2014-11-25 NOTE — Telephone Encounter (Signed)
Patient called requesting a change of medication from Sildenafil Citrate to Viagra. I contacted the pharmacy and Viagra will be covered thru his insurance with a $45 copay. Advised patient and he request that Viagra be submitted to [facility]. If you have any questions you can contact him at [phone].  KB

## 2014-11-26 ENCOUNTER — Telehealth: Payer: Self-pay | Admitting: Family Medicine

## 2014-11-26 ENCOUNTER — Other Ambulatory Visit: Payer: Self-pay | Admitting: Family Medicine

## 2014-11-26 MED ORDER — SILDENAFIL CITRATE 100 MG PO TABS: 50.0000 mg | ORAL_TABLET | Freq: Every day | ORAL | Status: DC | PRN

## 2014-11-26 NOTE — Telephone Encounter (Signed)
Patient called back regarding his Viagra. He states that you had given him Viagra 100mg  1/2 to 1 per day as needed in the past. He checked with [employer] and they informed him that it would be covered at the same copay so he is requesting you change the 50mg  to 100mg . Please call patient if you have any questions. [place]

## 2014-12-06 ENCOUNTER — Ambulatory Visit (INDEPENDENT_AMBULATORY_CARE_PROVIDER_SITE_OTHER): Payer: 59

## 2014-12-06 DIAGNOSIS — N2889 Other specified disorders of kidney and ureter: Principal | ICD-10-CM

## 2014-12-06 DIAGNOSIS — I6529 Occlusion and stenosis of unspecified carotid artery: Secondary | ICD-10-CM | POA: Diagnosis not present

## 2014-12-06 DIAGNOSIS — R000 Tachycardia, unspecified: Secondary | ICD-10-CM

## 2014-12-06 DIAGNOSIS — I151 Hypertension secondary to other renal disorders: Secondary | ICD-10-CM

## 2014-12-13 ENCOUNTER — Other Ambulatory Visit: Payer: Self-pay

## 2014-12-13 DIAGNOSIS — E785 Hyperlipidemia, unspecified: Secondary | ICD-10-CM

## 2014-12-27 ENCOUNTER — Ambulatory Visit: Payer: Medicare Other | Admitting: Family Medicine

## 2014-12-27 DIAGNOSIS — F17200 Nicotine dependence, unspecified, uncomplicated: Secondary | ICD-10-CM | POA: Insufficient documentation

## 2014-12-27 DIAGNOSIS — L210 Seborrhea capitis: Secondary | ICD-10-CM

## 2014-12-27 DIAGNOSIS — I779 Disorder of arteries and arterioles, unspecified: Secondary | ICD-10-CM | POA: Insufficient documentation

## 2014-12-27 DIAGNOSIS — IMO0001 Reserved for inherently not codable concepts without codable children: Secondary | ICD-10-CM | POA: Insufficient documentation

## 2014-12-27 DIAGNOSIS — N185 Chronic kidney disease, stage 5: Secondary | ICD-10-CM | POA: Insufficient documentation

## 2014-12-27 DIAGNOSIS — I739 Peripheral vascular disease, unspecified: Secondary | ICD-10-CM

## 2014-12-27 HISTORY — DX: Seborrhea capitis: L21.0

## 2015-01-24 ENCOUNTER — Other Ambulatory Visit: Payer: Self-pay

## 2015-01-24 ENCOUNTER — Encounter: Payer: Self-pay | Admitting: Family Medicine

## 2015-01-24 ENCOUNTER — Ambulatory Visit
Admission: RE | Admit: 2015-01-24 | Discharge: 2015-01-24 | Disposition: A | Discharge status: Home / Self Care | Payer: Medicare Other | Source: Ambulatory Visit | Attending: Family Medicine | Admitting: Family Medicine

## 2015-01-24 ENCOUNTER — Ambulatory Visit (INDEPENDENT_AMBULATORY_CARE_PROVIDER_SITE_OTHER): Payer: 59 | Admitting: Family Medicine

## 2015-01-24 VITALS — BP 128/78 | HR 78 | Temp 97.9°F | Resp 16 | Wt 219.2 lb

## 2015-01-24 DIAGNOSIS — H0520 Unspecified exophthalmos: Secondary | ICD-10-CM | POA: Insufficient documentation

## 2015-01-24 DIAGNOSIS — Z940 Kidney transplant status: Secondary | ICD-10-CM | POA: Insufficient documentation

## 2015-01-24 DIAGNOSIS — M79671 Pain in right foot: Secondary | ICD-10-CM | POA: Diagnosis not present

## 2015-01-24 DIAGNOSIS — I6529 Occlusion and stenosis of unspecified carotid artery: Secondary | ICD-10-CM

## 2015-01-24 DIAGNOSIS — R002 Palpitations: Secondary | ICD-10-CM | POA: Insufficient documentation

## 2015-01-24 DIAGNOSIS — R2240 Localized swelling, mass and lump, unspecified lower limb: Secondary | ICD-10-CM | POA: Insufficient documentation

## 2015-01-24 DIAGNOSIS — M79604 Pain in right leg: Secondary | ICD-10-CM | POA: Diagnosis not present

## 2015-01-24 MED ORDER — OXYCODONE HCL 5 MG PO TABS: 5.0000 mg | ORAL_TABLET | ORAL | Status: DC | PRN

## 2015-01-24 NOTE — Progress Notes (Signed)
Patient ID: MCGARRETT SONNER, male   DOB: Dec 18, 1965, 49 y.o.   MRN: [id number] Name: James Moreno   MRN: [id number]    DOB: [birth year]   Date:01/24/2015       Progress Note  Subjective  Chief Complaint  Chief Complaint  Patient presents with  . Foot Pain    right foot     Foot Pain This is a new problem. The current episode started in the past 7 days. The problem occurs constantly. The problem has been gradually worsening.  Initial injury to the right foot occurred when he stepped on a rock at home 3 weeks ago. Improving until he stepped on a stair step Saturday (01-22-15) and immediately had sharp pain. Continues to worsen and presents on crutches. Past Surgical History  Procedure Laterality Date  . Eye surgery      right  . Hemorroidectomy    . Appendectomy    . Refractive surgery Left   . Kidney transplant Right 2016  . Repair of meckel's diverticuluim       as an infant  . Sleep study  01/09/2011    Severe sleep apnea. AHI 72.9/hr. RDI=83.0/hr. Desaturation to 69.0% Emergency CPAP titaration to 14.0cm  . Carotid doppler ultrasound  06/14/2009    39% stenosis of bilateral internal carotid artery, bilateral anterograde vertebral flow  . Myocardial perfusion scan  01/17/2009    [facility], non- ischemic. LVEF= 55%    Past Medical History  Diagnosis Date  . Unspecified essential hypertension   . Other and unspecified hyperlipidemia   . Type II or unspecified type diabetes mellitus without mention of complication, not stated as uncontrolled   . Primary pulmonary HTN ([place])   . Nonspecific abnormal electrocardiogram (ECG) (EKG)   . Proteinuria   . Unspecified disorder of kidney and ureter   . Mitral valve disorders   . Tricuspid valve disorders, specified as nonrheumatic   . Acute kidney failure, unspecified ([place])   . Carotid artery occlusion   . Peritoneal dialysis catheter in place [facility])   . PAD (peripheral artery disease) ([place])     left leg     Social  History  Substance Use Topics  . Smoking status: Current Some Day Smoker -- 1.50 packs/day for 31 years    Types: Cigarettes  . Smokeless tobacco: Former Systems developer    Quit date: 11/26/2011     Comment: Quit 3 months ago  . Alcohol Use: No     Comment: Excessive alcohol consumption in the past     Current outpatient prescriptions:  .  aspirin 81 MG tablet, Take 81 mg by mouth daily., Disp: , Rfl:  .  carvedilol (COREG) 3.125 MG tablet, Take 1 tablet (3.125 mg total) by mouth 2 (two) times daily., Disp: 60 tablet, Rfl: 6 .  insulin aspart (NOVOLOG) 100 UNIT/ML injection, Inject 25 Units into the skin 3 (three) times daily before meals., Disp: , Rfl:  .  Insulin Glargine (TOUJEO SOLOSTAR [place]), Inject 70 Units into the skin daily., Disp: , Rfl:  .  lovastatin (MEVACOR) 40 MG tablet, Take 40 mg by mouth at bedtime., Disp: , Rfl:  .  mycophenolate (MYFORTIC) 180 MG EC tablet, Take 180 mg by mouth 2 (two) times daily., Disp: , Rfl:  .  omeprazole (PRILOSEC) 20 MG capsule, Take 20 mg by mouth daily., Disp: , Rfl:  .  sildenafil (VIAGRA) 100 MG tablet, Take 0.5-1 tablets (50-100 mg total) by mouth daily as needed for erectile dysfunction., Disp:  30 tablet, Rfl: 1 .  tacrolimus (PROGRAF) 1 MG capsule, , Disp: , Rfl: 11 .  tacrolimus (PROGRAF) 5 MG capsule, Take 5 mg by mouth 2 (two) times daily., Disp: , Rfl:  .  oxyCODONE (OXY IR/ROXICODONE) 5 MG immediate release tablet, Take 1 tablet (5 mg total) by mouth every 4 (four) hours as needed for severe pain. (Patient not taking: Reported on 01/24/2015), Disp: 30 tablet, Rfl: 0 .  valGANciclovir (VALCYTE) 450 MG tablet, , Disp: , Rfl: 2  No Known Allergies  Review of Systems  Constitutional: Negative.   HENT: Negative.   Eyes: Negative.   Respiratory: Negative.   Cardiovascular: Negative.   Gastrointestinal: Negative.   Genitourinary: Negative.   Musculoskeletal: Positive for joint pain.  Skin: Negative.   Neurological: Negative.    Endo/Heme/Allergies: Negative.   Psychiatric/Behavioral: Negative.    Objective  Filed Vitals:   01/24/15 0848  BP: 128/78  Pulse: 78  Temp: 97.9 F (36.6 C)  TempSrc: Oral  Resp: 16  Weight: 219 lb 3.2 oz (99.428 kg)   Physical Exam  Constitutional: He is well-developed, well-nourished, and in no distress.  HENT:  Head: Normocephalic.  Eyes: Conjunctivae are normal.  Neck: Normal range of motion. Neck supple.  Musculoskeletal: He exhibits tenderness.  Exquisite pain in the right lateral foot over the 5th tarsal-metatarsal joint to palpate or bear weight. Good pulses and no numbness.    Recent Results (from the past 2160 hour(s))  Glucose, capillary     Status: Abnormal   Collection Time: 11/17/14  6:14 AM  Result Value Ref Range   Glucose-Capillary 417 (H) 65 - 99 mg/dL   Assessment & Plan  1. Pain of right lower extremity Recent onset with secondary injury on 01-22-15. Will get x-ray to check for fracture. Refilled Roxicodone for acute pain. Continue crutches and may need cast boot or orthopedic referral if any significant displaced fracture. - DG Foot Complete Right - oxyCODONE (OXY IR/ROXICODONE) 5 MG immediate release tablet; Take 1 tablet (5 mg total) by mouth every 4 (four) hours as needed for severe pain.  Dispense: 30 tablet; Refill: 0  2. Renal transplant, status post Had renal transplant this year and on rejection medications (Myfortic and Prograf). May need referral to orthopedist due to side effect potential for delay of healing and increase of bone weakening.

## 2015-01-25 ENCOUNTER — Telehealth: Payer: Self-pay

## 2015-01-25 ENCOUNTER — Telehealth: Payer: Self-pay | Admitting: Family Medicine

## 2015-01-25 DIAGNOSIS — M79671 Pain in right foot: Secondary | ICD-10-CM

## 2015-01-25 DIAGNOSIS — M79604 Pain in right leg: Secondary | ICD-10-CM

## 2015-01-25 NOTE — Telephone Encounter (Signed)
-----   Message from Margo Common, [place] sent at 01/24/2015  6:12 PM EDT ----- No bone fracture or dislocation. There are vascular calcification throughout the foot and ankle. Recommend using the boot cast and recheck progress in 7-10 days. May need orthopedic referral and possible vascular referral if no better.

## 2015-01-25 NOTE — Telephone Encounter (Signed)
Patient advised as directed below. Patient verbalized understanding.   Patient states he is in extreme pain and is requesting to take a increased dose of Oxycodone that was prescribed on 01/24/2015.   Please advise.

## 2015-01-25 NOTE — Telephone Encounter (Signed)
Pt states that his oxycodone was increased to 2 tablets every 6 hrs.He will run out of his medication sometime this week and his appointment with orthopaedic doctor is not until next week 02-03-15.He would like another refill

## 2015-01-25 NOTE — Telephone Encounter (Signed)
If pain worsening despite use of Oxycodone, need orthopedic referral for foot pain.

## 2015-01-25 NOTE — Telephone Encounter (Signed)
Patient advised as directed below. Patient states that he could not wait to see a orthopedist with the extreme pain. Patient requested an appointment to see Dr. Caryn Section today. Advised patient that Dr. Caryn Section does not have any available appointments for today. Discussed situation with Dr. Caryn Section. Per Dr. Caryn Section patient can increase Oxycodone to 2 tablets every 6 hours and proceed with Ortho referral. Referral ordered. Patient verbalized understanding.

## 2015-01-27 MED ORDER — OXYCODONE HCL 5 MG PO TABS: 10.0000 mg | ORAL_TABLET | Freq: Four times a day (QID) | ORAL | Status: DC | PRN

## 2015-01-27 NOTE — Telephone Encounter (Signed)
Patient advised RX is ready for pick up.  

## 2015-01-27 NOTE — Telephone Encounter (Signed)
Will write for increase to 2 tablets every 6 hours as instructed by Dr. Caryn Section. This cannot be electronically prescribed. Will have to be picked up at the office.

## 2015-02-01 ENCOUNTER — Telehealth: Payer: Self-pay | Admitting: Family Medicine

## 2015-02-01 ENCOUNTER — Other Ambulatory Visit: Payer: Self-pay | Admitting: Family Medicine

## 2015-02-01 DIAGNOSIS — M79604 Pain in right leg: Secondary | ICD-10-CM

## 2015-02-01 MED ORDER — OXYCODONE HCL 5 MG PO TABS: 10.0000 mg | ORAL_TABLET | Freq: Four times a day (QID) | ORAL | Status: DC | PRN

## 2015-02-01 NOTE — Telephone Encounter (Signed)
Pt called back saying he is going to be out of the pain medication by the time he goes to the orthopedic doctor.  Can you please refill.  oxyCODONE (OXY IR/ROXICODONE) 5 MG immediate release tablet 01/27/15 --  Call back is [phone]  Thanks,  Con Memos

## 2015-02-01 NOTE — Telephone Encounter (Signed)
Pt called to see if RX was ready and after pt was advised it wasn't ready pt stated that he is coming to the office to wait on the RX b/c he can not drive back to town today. Thanks TNP

## 2015-02-06 ENCOUNTER — Encounter: Payer: Self-pay | Admitting: Family Medicine

## 2015-02-07 ENCOUNTER — Encounter: Payer: Self-pay | Admitting: *Deleted

## 2015-02-07 ENCOUNTER — Telehealth: Payer: Self-pay | Admitting: Family Medicine

## 2015-02-07 DIAGNOSIS — M79604 Pain in right leg: Secondary | ICD-10-CM

## 2015-02-07 MED ORDER — OXYCODONE HCL 5 MG PO TABS: 10.0000 mg | ORAL_TABLET | Freq: Four times a day (QID) | ORAL | Status: DC | PRN

## 2015-02-07 NOTE — Telephone Encounter (Signed)
Pt stated that he spoke with someone from our office about an hour ago and they were supposed to call him back about his refill for oxyCODONE (OXY IR/ROXICODONE) 5 MG immediate release tablet. Pt stated that he was coming up here to get the RX since we are closing at 5. Pt is aware that RX isn't ready and he wasn't told it would be ready. Pt stated that he would come here and wait for it. Thanks TNP

## 2015-02-07 NOTE — Telephone Encounter (Signed)
Patient reports that he needs another Rx for Oxycodone for pain. He reports that he is getting low on the pills that was prescribed last week. Patient reports that ortho recommended that he ask his PCP for a refill on medication. Please call when ready. Thanks!

## 2015-02-07 NOTE — Telephone Encounter (Signed)
Pt went to orthopedic doctor last Thursday for a broken foot.  He is out of pain meds.  The ortho doctor told him to follow up with his primary for pain meds.  Please advise.  [phone]  Thanks Con Memos

## 2015-02-07 NOTE — OR Nursing (Signed)
Cleared by Dr B Hippen urology 01/20/15

## 2015-02-09 ENCOUNTER — Ambulatory Visit: Payer: Medicare Other | Admitting: Anesthesiology

## 2015-02-09 ENCOUNTER — Encounter
Admission: RE | Disposition: A | Discharge status: Home / Self Care | Payer: Self-pay | Source: Ambulatory Visit | Attending: Ophthalmology

## 2015-02-09 ENCOUNTER — Encounter: Payer: Self-pay | Admitting: Ophthalmology

## 2015-02-09 ENCOUNTER — Ambulatory Visit
Admission: RE | Admit: 2015-02-09 | Discharge: 2015-02-09 | Disposition: A | Discharge status: Home / Self Care | Payer: Medicare Other | Source: Ambulatory Visit | Attending: Ophthalmology | Admitting: Ophthalmology

## 2015-02-09 DIAGNOSIS — Z794 Long term (current) use of insulin: Secondary | ICD-10-CM | POA: Insufficient documentation

## 2015-02-09 DIAGNOSIS — Z79899 Other long term (current) drug therapy: Secondary | ICD-10-CM | POA: Insufficient documentation

## 2015-02-09 DIAGNOSIS — Z7982 Long term (current) use of aspirin: Secondary | ICD-10-CM | POA: Diagnosis not present

## 2015-02-09 DIAGNOSIS — N189 Chronic kidney disease, unspecified: Secondary | ICD-10-CM | POA: Insufficient documentation

## 2015-02-09 DIAGNOSIS — I129 Hypertensive chronic kidney disease with stage 1 through stage 4 chronic kidney disease, or unspecified chronic kidney disease: Secondary | ICD-10-CM | POA: Insufficient documentation

## 2015-02-09 DIAGNOSIS — E113592 Type 2 diabetes mellitus with proliferative diabetic retinopathy without macular edema, left eye: Secondary | ICD-10-CM | POA: Insufficient documentation

## 2015-02-09 DIAGNOSIS — Z940 Kidney transplant status: Secondary | ICD-10-CM | POA: Insufficient documentation

## 2015-02-09 DIAGNOSIS — E113599 Type 2 diabetes mellitus with proliferative diabetic retinopathy without macular edema, unspecified eye: Secondary | ICD-10-CM | POA: Diagnosis present

## 2015-02-09 DIAGNOSIS — E079 Disorder of thyroid, unspecified: Secondary | ICD-10-CM | POA: Insufficient documentation

## 2015-02-09 DIAGNOSIS — K219 Gastro-esophageal reflux disease without esophagitis: Secondary | ICD-10-CM | POA: Insufficient documentation

## 2015-02-09 DIAGNOSIS — F17200 Nicotine dependence, unspecified, uncomplicated: Secondary | ICD-10-CM | POA: Insufficient documentation

## 2015-02-09 DIAGNOSIS — E1122 Type 2 diabetes mellitus with diabetic chronic kidney disease: Secondary | ICD-10-CM | POA: Insufficient documentation

## 2015-02-09 HISTORY — DX: Essential (primary) hypertension: I10

## 2015-02-09 HISTORY — DX: Hypothyroidism, unspecified: E03.9

## 2015-02-09 HISTORY — DX: Gastro-esophageal reflux disease without esophagitis: K21.9

## 2015-02-09 HISTORY — PX: PARS PLANA VITRECTOMY: SHX2166

## 2015-02-09 LAB — GLUCOSE, CAPILLARY
Glucose-Capillary: 132 mg/dL — ABNORMAL HIGH (ref 65–99)
Glucose-Capillary: 101 mg/dL — ABNORMAL HIGH (ref 65–99)

## 2015-02-09 SURGERY — PARS PLANA VITRECTOMY WITH 25 GAUGE
Anesthesia: General | Site: Eye | Laterality: Left | Wound class: Clean

## 2015-02-09 MED ORDER — FENTANYL CITRATE (PF) 100 MCG/2ML IJ SOLN
INTRAMUSCULAR | Status: DC | PRN
  Administered 2015-02-09 (×4): 25 ug via INTRAVENOUS

## 2015-02-09 MED ORDER — PROPOFOL 10 MG/ML IV BOLUS
INTRAVENOUS | Status: DC | PRN
  Administered 2015-02-09 (×2): 20 mg via INTRAVENOUS

## 2015-02-09 MED ORDER — PHENYLEPHRINE HCL 10 % OP SOLN
OPHTHALMIC | Status: AC
  Administered 2015-02-09: 1 [drp] via OPHTHALMIC
  Filled 2015-02-09: qty 5

## 2015-02-09 MED ORDER — CYCLOPENTOLATE HCL 2 % OP SOLN
OPHTHALMIC | Status: AC
  Administered 2015-02-09: 1 [drp] via OPHTHALMIC
  Filled 2015-02-09: qty 2

## 2015-02-09 MED ORDER — BSS PLUS IO SOLN
Freq: Once | INTRAOCULAR | Status: DC
  Filled 2015-02-09: qty 500

## 2015-02-09 MED ORDER — ONDANSETRON HCL 4 MG/2ML IJ SOLN: 4.0000 mg | Freq: Once | INTRAMUSCULAR | Status: DC | PRN

## 2015-02-09 MED ORDER — PHENYLEPHRINE HCL 10 % OP SOLN
1.0000 [drp] | OPHTHALMIC | Status: AC | PRN
  Administered 2015-02-09 (×3): 1 [drp] via OPHTHALMIC

## 2015-02-09 MED ORDER — ALFENTANIL 500 MCG/ML IJ INJ
INJECTION | INTRAMUSCULAR | Status: DC | PRN
  Administered 2015-02-09 (×2): 250 ug via INTRAVENOUS

## 2015-02-09 MED ORDER — SODIUM CHLORIDE 0.9 % IV SOLN
INTRAVENOUS | Status: DC
  Administered 2015-02-09: 50 mL/h via INTRAVENOUS

## 2015-02-09 MED ORDER — MIDAZOLAM HCL 2 MG/2ML IJ SOLN
INTRAMUSCULAR | Status: DC | PRN
  Administered 2015-02-09: 2 mg via INTRAVENOUS

## 2015-02-09 MED ORDER — MIDAZOLAM HCL 2 MG/2ML IJ SOLN
2.0000 mg | Freq: Once | INTRAMUSCULAR | Status: AC
  Administered 2015-02-09: 2 mg via INTRAVENOUS

## 2015-02-09 MED ORDER — MIDAZOLAM HCL 2 MG/2ML IJ SOLN
INTRAMUSCULAR | Status: AC
  Administered 2015-02-09: 2 mg via INTRAVENOUS
  Filled 2015-02-09: qty 2

## 2015-02-09 MED ORDER — MIDAZOLAM HCL 2 MG/2ML IJ SOLN: 1.0000 mg | Freq: Once | INTRAMUSCULAR | Status: DC

## 2015-02-09 MED ORDER — CYCLOPENTOLATE HCL 2 % OP SOLN
1.0000 [drp] | OPHTHALMIC | Status: AC | PRN
  Administered 2015-02-09 (×3): 1 [drp] via OPHTHALMIC

## 2015-02-09 SURGICAL SUPPLY — 27 items
APPLICATOR COTTON TIP 6IN STRL (MISCELLANEOUS) IMPLANT
CANNULA SOFT TIP 25G (CANNULA) IMPLANT
CORD BIP STRL DISP 12FT (MISCELLANEOUS) IMPLANT
CUP MEDICINE 2OZ PLAST GRAD ST (MISCELLANEOUS) IMPLANT
ERASER HMR WETFIELD 25G (MISCELLANEOUS) IMPLANT
FORCEPS GRIESH GRASP 25G (INSTRUMENTS) IMPLANT
FORCEPS GRIESH ILM PLUS 25G (INSTRUMENTS) IMPLANT
GLOVE BIO SURGEON STRL SZ8 (GLOVE) IMPLANT
GLOVE SURG LX 6.5 MICRO (GLOVE)
GLOVE SURG LX STRL 6.5 MICRO (GLOVE) IMPLANT
GLOVE SURG PR MICRO ENCORE 7 (GLOVE) ×2 IMPLANT
GOWN STRL REUS W/ TWL LRG LVL3 (GOWN DISPOSABLE) IMPLANT
GOWN STRL REUS W/TWL LRG LVL3 (GOWN DISPOSABLE)
LENS BIOM OPTIC SET 200MM DISP (MISCELLANEOUS) IMPLANT
LENS VITRECTOMY FLAT DISP (MISCELLANEOUS) IMPLANT
NDL RETROBULBAR .5 NSTRL (NEEDLE) IMPLANT
NEEDLE FILTER BLUNT 18X 1/2SAF (NEEDLE)
NEEDLE FILTER BLUNT 18X1 1/2 (NEEDLE) IMPLANT
PACK EYE AFTER SURG (MISCELLANEOUS) IMPLANT
PACK VITRECTOMY (MISCELLANEOUS) IMPLANT
PACK VITRECTOMY CASSETTE 25GA (MISCELLANEOUS) IMPLANT
PROBE DIRECTIONAL LASER (MISCELLANEOUS) IMPLANT
PROBE LASER ILLUM FLEX CVD 25G (OPHTHALMIC) IMPLANT
SOL PREP PVP 2OZ (MISCELLANEOUS)
SOLUTION PREP PVP 2OZ (MISCELLANEOUS) IMPLANT
STRAP SAFETY BODY (MISCELLANEOUS) IMPLANT
SYRINGE 10CC LL (SYRINGE) IMPLANT

## 2015-02-09 NOTE — Anesthesia Postprocedure Evaluation (Signed)
  Anesthesia Post-op Note  Patient: James Moreno  Procedure(s) Performed: Procedure(s): Pan retinal photocoagulation [phone] (Left)  Anesthesia type:General  Patient location: PACU  Post pain: Pain level controlled  Post assessment: Post-op Vital signs reviewed, Patient's Cardiovascular Status Stable, Respiratory Function Stable, Patent Airway and No signs of Nausea or vomiting  Post vital signs: Reviewed and stable  Last Vitals:  Filed Vitals:   02/09/15 0949  BP: 184/94  Pulse: 74  Temp:   Resp:     Level of consciousness: awake, alert  and patient cooperative  Complications: No apparent anesthesia complications

## 2015-02-09 NOTE — Discharge Instructions (Signed)
AMBULATORY SURGERY  °DISCHARGE INSTRUCTIONS ° ° °1) The drugs that you were given will stay in your system until tomorrow so for the next 24 hours you should not: ° °A) Drive an automobile °B) Make any legal decisions °C) Drink any alcoholic beverage ° ° °2) You may resume regular meals tomorrow.  Today it is better to start with liquids and gradually work up to solid foods. ° °You may eat anything you prefer, but it is better to start with liquids, then soup and crackers, and gradually work up to solid foods. ° ° °3) Please notify your doctor immediately if you have any unusual bleeding, trouble breathing, redness and pain at the surgery site, drainage, fever, or pain not relieved by medication. ° ° ° °4) Additional Instructions: ° ° ° ° ° ° ° °Please contact your physician with any problems or Same Day Surgery at [phone], Monday through Friday 6 am to 4 pm, or [place] [facility] [place] Main number at [phone]. °

## 2015-02-09 NOTE — H&P (Signed)
.  Previous H&P scanned in reviewed, patient examined, and no interval changes.  Please see scanned record for complete information.   

## 2015-02-09 NOTE — OR Nursing (Signed)
Patient stated he was very anxious regarding surgery upon arrival.  Dr. Ronelle Nigh had told patient that he would review his chart prior to ordering any medication.  Patient became angry and insisted he would leave if he was not taken seriously.  Discussed plan with Dr. Ronelle Nigh and agreed to Versed 2mg  ivp along with o2 via nasal prong and pulse ox monitoring.  Patient sleepy immediately but easily arousable.  o2 sats 98-99%.

## 2015-02-09 NOTE — Transfer of Care (Signed)
Immediate Anesthesia Transfer of Care Note  Patient: James Moreno  Procedure(s) Performed: Procedure(s): Pan retinal photocoagulation [phone] (Left)  Patient Location: PACU  Anesthesia Type:General  Level of Consciousness: awake  Airway & Oxygen Therapy: Patient Spontanous Breathing and Patient connected to nasal cannula oxygen  Post-op Assessment: Report given to RN and Post -op Vital signs reviewed and stable  Post vital signs: stable  Last Vitals:  Filed Vitals:   02/09/15 0902  BP: 155/87  Pulse: 81  Temp: 36.7 C  Resp: 15    Complications: No apparent anesthesia complications

## 2015-02-09 NOTE — Op Note (Signed)
  PREOPERATIVE DIAGNOSIS: Proliferative diabetic retinopathy, left eye POST OPERATIVE DIAGNOSIS: Proliferative diabetic retinopathy, left eye                     OPERATION PERFORMED: Panretinal photocoagulation, left eye.                     ANESTHESIA: MAC    COMPLICATIONS: None.     BLOOD LOSS: none  SPECIMEN: None.   DESCRIPTION OF PROCEDURE: On the day of surgery, the patient was greeted in the preoperative holding area.  All questions were answered and the left eye was marked.  The patient was then taken into the operating room in the supine position.    Monitored anesthesia care was administered and a panretinal photocoagulation laser was performed on the right eye.  Shots 1289, Power 300-400, Duration 100-200. Neo-Poly-dex ointment was applied to the ocular surface and patient was taken to the recovery area in stable condition.

## 2015-02-09 NOTE — Anesthesia Preprocedure Evaluation (Signed)
Anesthesia Evaluation  Patient identified by MRN, date of birth, ID band Patient awake    Reviewed: Allergy & Precautions, NPO status , Patient's Chart, lab work & pertinent test results, reviewed documented beta blocker date and time   History of Anesthesia Complications Negative for: history of anesthetic complications  Airway Mallampati: II       Dental  (+) Teeth Intact   Pulmonary neg pulmonary ROS, sleep apnea (lost 100 lbs, no problems now) , Current Smoker,           Cardiovascular hypertension, Pt. on medications and Pt. on home beta blockers + Peripheral Vascular Disease       Neuro/Psych Anxiety negative neurological ROS     GI/Hepatic Neg liver ROS, GERD  Medicated and Controlled,  Endo/Other  diabetes, Type 2, Oral Hypoglycemic Agents  Renal/GU Renal disease (s/p renal transplant)     Musculoskeletal   Abdominal   Peds  Hematology negative hematology ROS (+)   Anesthesia Other Findings   Reproductive/Obstetrics                             Anesthesia Physical Anesthesia Plan  ASA: III  Anesthesia Plan: MAC   Post-op Pain Management:    Induction: Intravenous  Airway Management Planned: Nasal Cannula  Additional Equipment:   Intra-op Plan:   Post-operative Plan:   Informed Consent: I have reviewed the patients History and Physical, chart, labs and discussed the procedure including the risks, benefits and alternatives for the proposed anesthesia with the patient or authorized representative who has indicated his/her understanding and acceptance.     Plan Discussed with:   Anesthesia Plan Comments:         Anesthesia Quick Evaluation

## 2015-02-14 ENCOUNTER — Other Ambulatory Visit: Payer: Self-pay | Admitting: Family Medicine

## 2015-02-14 DIAGNOSIS — M79604 Pain in right leg: Secondary | ICD-10-CM

## 2015-02-14 MED ORDER — OXYCODONE HCL 5 MG PO TABS: 10.0000 mg | ORAL_TABLET | Freq: Four times a day (QID) | ORAL | Status: DC | PRN

## 2015-02-14 NOTE — Telephone Encounter (Signed)
Pt contacted office for refill request on the following medications: oxyCODONE (OXY IR/ROXICODONE) 5 MG immediate release tablet. Thanks TNP

## 2015-02-21 ENCOUNTER — Other Ambulatory Visit: Payer: Self-pay | Admitting: Family Medicine

## 2015-02-21 DIAGNOSIS — M79604 Pain in right leg: Secondary | ICD-10-CM

## 2015-02-21 MED ORDER — OXYCODONE HCL 5 MG PO TABS: 10.0000 mg | ORAL_TABLET | Freq: Four times a day (QID) | ORAL | Status: DC | PRN

## 2015-02-21 NOTE — Telephone Encounter (Signed)
Pt needs refill oxyCODONE (OXY IR/ROXICODONE) 5 MG immediate release tablet   Thank sTeri

## 2015-02-22 ENCOUNTER — Other Ambulatory Visit: Payer: Self-pay | Admitting: Family Medicine

## 2015-02-22 MED ORDER — ALPRAZOLAM 2 MG PO TABS: ORAL_TABLET | ORAL | Status: DC

## 2015-02-22 NOTE — Telephone Encounter (Signed)
please call in alprazolam 2mg  1/2-1 tablet three times daily, as needed, #90, rf x 5

## 2015-02-22 NOTE — Telephone Encounter (Signed)
[facility] [phone], [phone]. Rx called into pharmacy.

## 2015-02-22 NOTE — Telephone Encounter (Signed)
Please advise 

## 2015-02-22 NOTE — Telephone Encounter (Signed)
Pt called saying [facility] needs you to write a prescription for generic xanax.  It is usually faxed to them.  He said we should have the fax number.  Pt's call back is [phone]  Thanks teri

## 2015-02-28 ENCOUNTER — Other Ambulatory Visit: Payer: Self-pay | Admitting: Family Medicine

## 2015-02-28 DIAGNOSIS — M79604 Pain in right leg: Secondary | ICD-10-CM

## 2015-02-28 MED ORDER — OXYCODONE HCL 5 MG PO TABS: 10.0000 mg | ORAL_TABLET | Freq: Four times a day (QID) | ORAL | Status: DC | PRN

## 2015-02-28 NOTE — Telephone Encounter (Signed)
Pt contacted office for refill request on the following medications: oxyCODONE (OXY IR/ROXICODONE) 5 MG immediate release tablet. Pt stated he is out of the medication and would like to get it today. Thanks TNP

## 2015-03-04 ENCOUNTER — Other Ambulatory Visit: Payer: Self-pay | Admitting: Family Medicine

## 2015-03-04 DIAGNOSIS — M79604 Pain in right leg: Secondary | ICD-10-CM

## 2015-03-04 NOTE — Telephone Encounter (Signed)
Pt contacted office for refill request on the following medications:  oxyCODONE (OXY IR/ROXICODONE) 5 MG immediate release tablet.  CB#[phone]/MW °

## 2015-03-07 MED ORDER — OXYCODONE HCL 5 MG PO TABS
10.0000 mg | ORAL_TABLET | Freq: Four times a day (QID) | ORAL | Status: DC | PRN
Start: 2015-03-07 — End: 2015-03-14

## 2015-03-14 ENCOUNTER — Telehealth: Payer: Self-pay | Admitting: Family Medicine

## 2015-03-14 ENCOUNTER — Other Ambulatory Visit: Payer: Self-pay | Admitting: Family Medicine

## 2015-03-14 DIAGNOSIS — M79604 Pain in right leg: Secondary | ICD-10-CM

## 2015-03-14 MED ORDER — OXYCODONE HCL 5 MG PO TABS: 10.0000 mg | ORAL_TABLET | Freq: Four times a day (QID) | ORAL | Status: DC | PRN

## 2015-03-14 NOTE — Telephone Encounter (Signed)
Pt contacted office for refill request on the following medications: oxyCODONE (OXY IR/ROXICODONE) 5 MG immediate release tablet. Pt would like to pick this up today if possible. Thanks TNP

## 2015-03-14 NOTE — Progress Notes (Signed)
Patient was notified.

## 2015-03-14 NOTE — Progress Notes (Signed)
Patient needs to schedule office visit in the next 1-2 weeks for follow up pain management.

## 2015-03-14 NOTE — Telephone Encounter (Signed)
Pt called to request a 30 day supply so he does not have to get this every week.  Pt states he is only getting 40 pills a week and this is only giving him enough medication for 5 days.  Pt also states he is still under doctors care with [facility].  [id number]

## 2015-03-14 NOTE — Telephone Encounter (Signed)
Pain medications were increased just to get by until treated by orthopedist. Was not meant to be permanent. We need records from his orthopedist. .

## 2015-03-18 ENCOUNTER — Encounter: Payer: Self-pay | Admitting: Family Medicine

## 2015-03-18 ENCOUNTER — Ambulatory Visit (INDEPENDENT_AMBULATORY_CARE_PROVIDER_SITE_OTHER): Payer: Medicare Other | Admitting: Family Medicine

## 2015-03-18 VITALS — BP 140/74 | HR 106 | Temp 98.0°F | Resp 16 | Ht 71.0 in | Wt 212.0 lb

## 2015-03-18 DIAGNOSIS — S92911S Unspecified fracture of right toe(s), sequela: Secondary | ICD-10-CM

## 2015-03-18 DIAGNOSIS — I6529 Occlusion and stenosis of unspecified carotid artery: Secondary | ICD-10-CM

## 2015-03-18 DIAGNOSIS — M545 Low back pain: Secondary | ICD-10-CM | POA: Diagnosis not present

## 2015-03-18 MED ORDER — LIDOCAINE 5 % EX PTCH: 1.0000 | MEDICATED_PATCH | CUTANEOUS | Status: DC

## 2015-03-18 NOTE — Progress Notes (Signed)
Patient: James Moreno Male    DOB: Oct 01, 1965   49 y.o.   MRN: [id number] Visit Date: 03/18/2015  Today's Provider: Lelon Huh, MD   Chief Complaint  Patient presents with  . Pain    pain management follow up   Subjective:    HPI  Pain Management:  Patient is here today for follow up on pain resulting from right foot injury. Patient is currently under the care of Dr. Lisette Grinder andd has a follow up with them scheduled 03/2015. He was initially seen by Vernie Murders on 01/24/2016 about 2 days after injury and prescribed oxycodone 5mg  which was not effective. We advised him he could 2x5mg  every 4 hours. He was referred to Dr. Lisette Grinder and subsequently diagnosed with right 5th metatarsal  Low back pain He has history of chronic LBP as well as chronic post surgical abdominal pain following renal transplant. He had been off of all opioids prior to his foot injury, but has tried some of his wife's Lidoderm patches which seem to help his back quit a bit.     No Known Allergies Previous Medications   ALPRAZOLAM (XANAX) 2 MG TABLET    Take 1/2 to 1 Tablet 3 Times Daily As Needed   ASPIRIN 81 MG TABLET    Take 81 mg by mouth daily.   CARVEDILOL (COREG) 6.25 MG TABLET    Take 6.25 mg by mouth 2 (two) times daily with a meal.   INSULIN ASPART (NOVOLOG) 100 UNIT/ML INJECTION    Inject 10 Units into the skin 3 (three) times daily before meals. Sliding scale   INSULIN GLARGINE (TOUJEO SOLOSTAR )    Inject 50 Units into the skin daily.    LOVASTATIN (MEVACOR) 40 MG TABLET    Take 40 mg by mouth at bedtime.   MYCOPHENOLATE (MYFORTIC) 180 MG EC TABLET    Take 180 mg by mouth 2 (two) times daily.   OMEPRAZOLE (PRILOSEC) 20 MG CAPSULE    Take 20 mg by mouth daily.   OXYCODONE (OXY IR/ROXICODONE) 5 MG IMMEDIATE RELEASE TABLET    Take 2 tablets (10 mg total) by mouth every 6 (six) hours as needed for severe pain.   SILDENAFIL (VIAGRA) 100 MG TABLET    Take 0.5-1 tablets (50-100 mg  total) by mouth daily as needed for erectile dysfunction.   TACROLIMUS (PROGRAF) 1 MG CAPSULE       TACROLIMUS (PROGRAF) 5 MG CAPSULE    Take 5 mg by mouth 2 (two) times daily.    Review of Systems  Constitutional: Negative for fever, chills and appetite change.  Respiratory: Negative for chest tightness, shortness of breath and wheezing.   Cardiovascular: Negative for chest pain and palpitations.  Gastrointestinal: Negative for nausea, vomiting and abdominal pain.  Musculoskeletal: Positive for arthralgias (right foot).    Social History  Substance Use Topics  . Smoking status: Current Some Day Smoker -- 0.50 packs/day for 31 years    Types: Cigarettes  . Smokeless tobacco: Former Systems developer    Quit date: 11/26/2011     Comment: Quit 3 months ago  . Alcohol Use: No     Comment: Excessive alcohol consumption in the past    Objective:   BP 140/74 mmHg  Pulse 106  Temp(Src) 98 F (36.7 C) (Oral)  Resp 16  Ht 5\' 11"  (1.803 m)  Wt 212 lb (96.163 kg)  BMI 29.58 kg/m2  SpO2 97%  Physical Exam  General appearance: alert, well  developed, well nourished, cooperative and in no distress Head: Normocephalic, without obvious abnormality, atraumatic Lungs: Respirations even and unlabored Extremities: Walking boot on right foot.  Skin: Skin color, texture, turgor normal. No rashes seen  Psych: Appropriate mood and affect. Neurologic: Mental status: Alert, oriented to person, place, and time, thought content appropriate. MS: Mild tenderness lumbar spine.     Assessment & Plan:     1. Low back pain, unspecified back pain laterality, with sciatica presence unspecified He would like to try lidocaine patch. Advised that insurance coverage tends to be very restrictive, but there is no an OTC lidocaine patch.  - lidocaine (LIDODERM) 5 %; Place 1 patch onto the skin daily. Remove & Discard patch within 12 hours or as directed by MD  Dispense: 30 patch; Refill: 1  2. Open toe fracture, right,  sequela Persistent pain with very slow healing by patient report. He is to follow up with Mack Guise in January as scheduled. He to work on weaning down oxycodone. If he is not able to control pain with 5mg  oxycodone consider change to 7.6mg  with next refill.         Lelon Huh, MD  [facility]

## 2015-03-23 ENCOUNTER — Encounter: Payer: Self-pay | Admitting: Family Medicine

## 2015-03-23 ENCOUNTER — Ambulatory Visit (INDEPENDENT_AMBULATORY_CARE_PROVIDER_SITE_OTHER): Payer: Medicare Other | Admitting: Family Medicine

## 2015-03-23 VITALS — BP 128/60 | HR 116 | Temp 98.4°F | Resp 18 | Wt 213.0 lb

## 2015-03-23 DIAGNOSIS — I6529 Occlusion and stenosis of unspecified carotid artery: Secondary | ICD-10-CM

## 2015-03-23 DIAGNOSIS — J029 Acute pharyngitis, unspecified: Secondary | ICD-10-CM | POA: Diagnosis not present

## 2015-03-23 MED ORDER — AMOXICILLIN 875 MG PO TABS
875.0000 mg | ORAL_TABLET | Freq: Two times a day (BID) | ORAL | Status: DC
Start: 2015-03-23 — End: 2015-05-30

## 2015-03-23 NOTE — Progress Notes (Signed)
Subjective:     Patient ID: James Moreno, male   DOB: 1965-05-08, 49 y.o.   MRN: [id number]  HPI  Chief Complaint  Patient presents with  . Cough    and congestion for a couple of weeks, now having sore throat and swollen neck glands  States cough has improved but he has been getting recurrent sore throat and anterior cervical tenderness. Reports exposure to children over Christmas. States his new kidney is functioning well and was told he could take otc medication for his symptoms by his renal M.D.'s. Remains on immune suppressive medication.   Review of Systems  Constitutional: Negative for fever and chills.       Objective:   Physical Exam  Constitutional: He appears well-developed and well-nourished. No distress.  Ears: T.M's intact without inflammation Throat: no tonsillar enlargement or exudate, mild posterior pharyngeal erythema. Neck: no cervical adenopathy Lungs: clear     Assessment:    1. Pharyngitis - amoxicillin (AMOXIL) 875 MG tablet; Take 1 tablet (875 mg total) by mouth 2 (two) times daily.  Dispense: 20 tablet; Refill: 0    Plan:    Discussed use of saline gargles and Tylenol.

## 2015-03-23 NOTE — Patient Instructions (Signed)
Discussed use of salt water gargles and tylenol for pain.

## 2015-03-29 ENCOUNTER — Other Ambulatory Visit: Payer: Self-pay | Admitting: Family Medicine

## 2015-03-29 DIAGNOSIS — M79604 Pain in right leg: Secondary | ICD-10-CM

## 2015-03-29 NOTE — Telephone Encounter (Signed)
Patient is requesting refill. Please call when ready. Thanks!

## 2015-03-29 NOTE — Telephone Encounter (Signed)
Pt contacted office for refill request on the following medications: oxyCODONE (OXY IR/ROXICODONE) 5 MG immediate release tablet Thanks TNP

## 2015-03-30 MED ORDER — OXYCODONE HCL 5 MG PO TABS
10.0000 mg | ORAL_TABLET | Freq: Four times a day (QID) | ORAL | Status: DC | PRN
Start: 2015-03-30 — End: 2015-04-18

## 2015-04-18 ENCOUNTER — Other Ambulatory Visit: Payer: Self-pay | Admitting: Family Medicine

## 2015-04-18 DIAGNOSIS — M79604 Pain in right leg: Secondary | ICD-10-CM

## 2015-04-18 NOTE — Telephone Encounter (Signed)
Pt contacted office for refill request on the following medications:  oxyCODONE (OXY IR/ROXICODONE) 5 MG immediate release tablet.  CB#[phone]/MW °

## 2015-04-19 MED ORDER — OXYCODONE HCL 5 MG PO TABS: 10.0000 mg | ORAL_TABLET | Freq: Four times a day (QID) | ORAL | Status: DC | PRN

## 2015-04-26 ENCOUNTER — Telehealth: Payer: Self-pay | Admitting: *Deleted

## 2015-04-26 NOTE — Telephone Encounter (Signed)
Patient was notified. Patient stated hat he will comply with new changes in quantity.

## 2015-04-26 NOTE — Telephone Encounter (Signed)
-----   Message from Birdie Sons, MD sent at 04/26/2015  1:36 PM EST ----- Regarding: oxycodone Please advise patient that his insurance will not cover more than 6 oxycodone per day, so he needs to be sure that he is not taking any more that that before he is due for another refill. Thanks.

## 2015-05-09 ENCOUNTER — Other Ambulatory Visit: Payer: Self-pay | Admitting: Family Medicine

## 2015-05-09 DIAGNOSIS — M79604 Pain in right leg: Secondary | ICD-10-CM

## 2015-05-09 MED ORDER — OXYCODONE HCL 5 MG PO TABS: 10.0000 mg | ORAL_TABLET | Freq: Four times a day (QID) | ORAL | Status: DC | PRN

## 2015-05-09 NOTE — Telephone Encounter (Signed)
Pt contacted office for refill request on the following medications: oxyCODONE (OXY IR/ROXICODONE) 5 MG immediate release tablet  Pt stated he is out of the medication b/c he wasn't advised that he needed to cut his dose down until he was almost finished with the last RX. Pt would like to pick up the RX today if possible. I advised that refill request can take 24 to 48 hours. Thanks TNP

## 2015-05-24 ENCOUNTER — Other Ambulatory Visit: Payer: Self-pay | Admitting: Family Medicine

## 2015-05-24 DIAGNOSIS — M79604 Pain in right leg: Secondary | ICD-10-CM

## 2015-05-24 MED ORDER — OXYCODONE HCL 5 MG PO TABS: 10.0000 mg | ORAL_TABLET | Freq: Four times a day (QID) | ORAL | Status: DC | PRN

## 2015-05-24 NOTE — Telephone Encounter (Signed)
Pt contacted office for refill request on the following medications:  oxyCODONE (OXY IR/ROXICODONE) 5 MG immediate release tablet.  CB#[phone]/MW °

## 2015-05-30 ENCOUNTER — Ambulatory Visit (INDEPENDENT_AMBULATORY_CARE_PROVIDER_SITE_OTHER): Payer: Medicare Other | Admitting: Family Medicine

## 2015-05-30 ENCOUNTER — Encounter: Payer: Self-pay | Admitting: Family Medicine

## 2015-05-30 VITALS — BP 166/86 | HR 87 | Temp 97.5°F | Resp 18 | Ht 71.0 in | Wt 224.0 lb

## 2015-05-30 DIAGNOSIS — H0011 Chalazion right upper eyelid: Secondary | ICD-10-CM | POA: Diagnosis not present

## 2015-05-30 MED ORDER — CEPHALEXIN 500 MG PO CAPS: 500.0000 mg | ORAL_CAPSULE | Freq: Four times a day (QID) | ORAL | Status: AC

## 2015-05-30 NOTE — Progress Notes (Signed)
Patient: James Moreno Male    DOB: [birth year]   50 y.o.   MRN: [id number] Visit Date: 05/30/2015  Today's Provider: Lelon Huh, MD   Chief Complaint  Patient presents with  . Stye    x 3 weeks   Subjective:    HPI  Stye of right upper eyelid:  Patient comes in today stating he has had a style on his right upper eye lid for 3 weeks. Patient reports the size is unchanges and is not painful. Patient has tried applying a warm compress which helped to bring it to a head. He has had some clear drainage from his right eye.      No Known Allergies Previous Medications   ALPRAZOLAM (XANAX) 2 MG TABLET    Take 1/2 to 1 Tablet 3 Times Daily As Needed   ASPIRIN 81 MG TABLET    Take 81 mg by mouth daily.   CARVEDILOL (COREG) 6.25 MG TABLET    Take 6.25 mg by mouth 2 (two) times daily with a meal.   INSULIN ASPART (NOVOLOG) 100 UNIT/ML INJECTION    Inject 10 Units into the skin 3 (three) times daily before meals. Sliding scale   INSULIN GLARGINE (TOUJEO SOLOSTAR [place])    Inject 50 Units into the skin daily.    LIDOCAINE (LIDODERM) 5 %    Place 1 patch onto the skin daily. Remove & Discard patch within 12 hours or as directed by MD   LOVASTATIN (MEVACOR) 40 MG TABLET    Take 40 mg by mouth at bedtime.   MYCOPHENOLATE (MYFORTIC) 180 MG EC TABLET    Take 180 mg by mouth 2 (two) times daily.   OMEPRAZOLE (PRILOSEC) 20 MG CAPSULE    Take 20 mg by mouth daily.   OXYCODONE (OXY IR/ROXICODONE) 5 MG IMMEDIATE RELEASE TABLET    Take 2 tablets (10 mg total) by mouth every 6 (six) hours as needed for severe pain.   SILDENAFIL (VIAGRA) 100 MG TABLET    Take 0.5-1 tablets (50-100 mg total) by mouth daily as needed for erectile dysfunction.   TACROLIMUS (PROGRAF) 1 MG CAPSULE    Takes 5 tablets every morning and 4 tablets every evening    Review of Systems  Constitutional: Negative for fever, chills, diaphoresis, appetite change and fatigue.  Eyes: Positive for discharge. Negative for pain,  redness, itching and visual disturbance.       Stye on right eyelid  Respiratory: Negative for chest tightness, shortness of breath and wheezing.   Cardiovascular: Negative for chest pain and palpitations.  Gastrointestinal: Negative for nausea, vomiting and abdominal pain.    Social History  Substance Use Topics  . Smoking status: Current Some Day Smoker -- 0.50 packs/day for 31 years    Types: Cigarettes  . Smokeless tobacco: Former Systems developer    Quit date: 11/26/2011     Comment: Quit 3 months ago  . Alcohol Use: No     Comment: Excessive alcohol consumption in the past    Objective:   BP 166/86 mmHg  Pulse 87  Temp(Src) 97.5 F (36.4 C) (Oral)  Resp 18  Ht 5\' 11"  (1.803 m)  Wt 224 lb (101.606 kg)  BMI 31.26 kg/m2  SpO2 98%  Physical Exam  Eye Exam: BB size chalazion right upper eyelid with moderate erythema of surrounding eyelid, but no conjunctival or periorbital erythema or swelling, and minimal tenderness.     Assessment & Plan:     1. Chalazion  of right upper eyelid  - cephALEXin (KEFLEX) 500 MG capsule; Take 1 capsule (500 mg total) by mouth 4 (four) times daily.  Dispense: 40 capsule; Refill: 0   Call if symptoms change or if not rapidly improving.          Lelon Huh, MD  [facility]

## 2015-06-10 ENCOUNTER — Other Ambulatory Visit: Payer: Self-pay | Admitting: Family Medicine

## 2015-06-10 DIAGNOSIS — M79604 Pain in right leg: Secondary | ICD-10-CM

## 2015-06-10 NOTE — Telephone Encounter (Signed)
Pt contacted office for refill request on the following medications: oxyCODONE (OXY IR/ROXICODONE) 5 MG immediate release tablet. Pt stated he would like to pick up Monday 06/13/15. Last written on 05/24/15 and last OV 05/30/15. Please advised. Thanks TNP

## 2015-06-13 MED ORDER — OXYCODONE HCL 5 MG PO TABS: 10.0000 mg | ORAL_TABLET | Freq: Four times a day (QID) | ORAL | Status: DC | PRN

## 2015-06-14 ENCOUNTER — Telehealth: Payer: Self-pay | Admitting: Cardiovascular Disease

## 2015-06-14 NOTE — Telephone Encounter (Signed)
3 attempts to schedule fu from recall Patient states he is on vacation and will call another day next week to discuss.  Deleting recall.

## 2015-06-21 ENCOUNTER — Telehealth: Payer: Self-pay | Admitting: Cardiovascular Disease

## 2015-06-21 ENCOUNTER — Other Ambulatory Visit: Payer: Self-pay | Admitting: *Deleted

## 2015-06-21 MED ORDER — CARVEDILOL 6.25 MG PO TABS: 6.2500 mg | ORAL_TABLET | Freq: Two times a day (BID) | ORAL | Status: DC

## 2015-06-21 NOTE — Telephone Encounter (Signed)
°*  STAT* If patient is at the pharmacy, call can be transferred to refill team.   1. Which medications need to be refilled? (please list name of each medication and dose if known) COREG 6.25 mg po twice daily   2. Which pharmacy/location (including street and city if local pharmacy) is medication to be sent to?   [facility] rx   [facility] [place]   Fax rx : [phone]   3. Do they need a 30 day or 90 day supply? James Moreno THIS IS A DOSE CHANGE FROM THE PREVIOUS RX AND WANT A NEW RX FAXED   Please send today as patient rx needs to be mailed

## 2015-06-22 ENCOUNTER — Telehealth: Payer: Self-pay | Admitting: Family Medicine

## 2015-06-22 NOTE — Telephone Encounter (Signed)
Not currently accepting new patients.  

## 2015-06-22 NOTE — Telephone Encounter (Signed)
Pt would like to see if Dr. Caryn Section could [facility]. Pt and James Moreno are not happy with the care she has been receiving at [facility]. DOB: [birth year]  Insurance : Medicare & Medicaid  Current Medication: Oxycodone 10 mg, Provastatin 40 mg, Otezla 30 mg, & Xanax 0.5 mg  Current Medical conditions: nerve damage & Degenerative Disc Disease.  Current PCP Dr. Brigitte Pulse  Can we Gate as a new pt? Please advise. Thanks TNP

## 2015-06-24 NOTE — Telephone Encounter (Signed)
Called pt and advised pt of below. Thanks TNP

## 2015-06-27 ENCOUNTER — Other Ambulatory Visit: Payer: Self-pay | Admitting: Family Medicine

## 2015-06-27 DIAGNOSIS — M79604 Pain in right leg: Secondary | ICD-10-CM

## 2015-06-27 MED ORDER — OXYCODONE HCL 5 MG PO TABS: 10.0000 mg | ORAL_TABLET | Freq: Four times a day (QID) | ORAL | Status: DC | PRN

## 2015-06-27 NOTE — Telephone Encounter (Signed)
Pt contacted office for refill request on the following medications: oxyCODONE (OXY IR/ROXICODONE) 5 MG immediate release tablet. Thanks TNP

## 2015-07-13 ENCOUNTER — Other Ambulatory Visit: Payer: Self-pay

## 2015-07-13 DIAGNOSIS — M79604 Pain in right leg: Secondary | ICD-10-CM

## 2015-07-13 MED ORDER — OXYCODONE HCL 5 MG PO TABS: 10.0000 mg | ORAL_TABLET | Freq: Four times a day (QID) | ORAL | Status: DC | PRN

## 2015-07-13 NOTE — Telephone Encounter (Signed)
Patient is requesting refill on medication. Please call when ready. Thanks!

## 2015-07-21 ENCOUNTER — Other Ambulatory Visit: Payer: Self-pay | Admitting: Cardiovascular Disease

## 2015-07-27 ENCOUNTER — Other Ambulatory Visit: Payer: Self-pay | Admitting: Family Medicine

## 2015-07-27 DIAGNOSIS — M79604 Pain in right leg: Secondary | ICD-10-CM

## 2015-07-27 NOTE — Telephone Encounter (Signed)
Pt needs refill oxyCODONE (OXY IR/ROXICODONE) 5 MG immediate release tablet 07/13/15 -- Birdie Sons, MD Take 2 tablets (10 mg total) by mouth every 6 (six) hours as needed for severe   Please call when ready to pick up.  Thanks, [employer]

## 2015-07-28 MED ORDER — OXYCODONE HCL 5 MG PO TABS: 10.0000 mg | ORAL_TABLET | Freq: Four times a day (QID) | ORAL | Status: DC | PRN

## 2015-08-12 ENCOUNTER — Telehealth: Payer: Self-pay | Admitting: Family Medicine

## 2015-08-12 DIAGNOSIS — M79604 Pain in right leg: Secondary | ICD-10-CM

## 2015-08-12 NOTE — Telephone Encounter (Signed)
Last fill 07/13/15 and LOV 05/31/15. Please review-aa

## 2015-08-12 NOTE — Telephone Encounter (Signed)
Pt contacted office for refill request on the following medications:  oxyCODONE (OXY IR/ROXICODONE) 5 MG immediate release tablet.  CB#[phone]/MW °

## 2015-08-15 MED ORDER — OXYCODONE HCL 5 MG PO TABS: 10.0000 mg | ORAL_TABLET | Freq: Four times a day (QID) | ORAL | Status: DC | PRN

## 2015-08-25 ENCOUNTER — Other Ambulatory Visit: Payer: Self-pay | Admitting: Family Medicine

## 2015-08-25 NOTE — Telephone Encounter (Signed)
Pt contacted office for refill request on the following medications: alprazolam (XANAX) 2 MG tablet.Marland Kitchen  [facility]/fax [id number]

## 2015-08-26 MED ORDER — ALPRAZOLAM 2 MG PO TABS: ORAL_TABLET | ORAL | Status: DC

## 2015-08-26 NOTE — Telephone Encounter (Signed)
Please call in alprazolam.  

## 2015-08-26 NOTE — Telephone Encounter (Signed)
Rx called into pharmacy. Patient was notified.   

## 2015-08-26 NOTE — Telephone Encounter (Signed)
Pt called to see if RX for alprazolam Duanne Moron) 2 MG tablet had been called into [facility]. Pt stated he was advised by pharmacy that they hadn't received an RX. Please advise. Thanks TNP

## 2015-08-29 ENCOUNTER — Other Ambulatory Visit: Payer: Self-pay | Admitting: Family Medicine

## 2015-08-29 DIAGNOSIS — M79604 Pain in right leg: Secondary | ICD-10-CM

## 2015-08-29 MED ORDER — OXYCODONE HCL 5 MG PO TABS: 10.0000 mg | ORAL_TABLET | Freq: Four times a day (QID) | ORAL | Status: DC | PRN

## 2015-08-29 NOTE — Telephone Encounter (Signed)
Pt contacted office for refill request on the following medications:  oxyCODONE (OXY IR/ROXICODONE) 5 MG immediate release tablet.  CB#[phone]/MW °

## 2015-09-12 ENCOUNTER — Other Ambulatory Visit: Payer: Self-pay | Admitting: Family Medicine

## 2015-09-12 DIAGNOSIS — M79604 Pain in right leg: Secondary | ICD-10-CM

## 2015-09-12 MED ORDER — OXYCODONE HCL 5 MG PO TABS: 10.0000 mg | ORAL_TABLET | Freq: Four times a day (QID) | ORAL | Status: DC | PRN

## 2015-09-12 NOTE — Telephone Encounter (Signed)
Pt needs refill oxyCODONE (OXY IR/ROXICODONE) 5 MG immediate release tablet  Please call when RX is ready [phone]  Thanks, Con Memos

## 2015-09-28 ENCOUNTER — Other Ambulatory Visit: Payer: Self-pay | Admitting: Family Medicine

## 2015-09-28 DIAGNOSIS — M79604 Pain in right leg: Secondary | ICD-10-CM

## 2015-09-28 NOTE — Telephone Encounter (Signed)
Pt contacted office for refill request on the following medications:  oxyCODONE (OXY IR/ROXICODONE) 5 MG immediate release tablet.  CB#[phone]/MW °

## 2015-09-29 MED ORDER — OXYCODONE HCL 5 MG PO TABS: 10.0000 mg | ORAL_TABLET | Freq: Four times a day (QID) | ORAL | Status: DC | PRN

## 2015-10-13 ENCOUNTER — Other Ambulatory Visit: Payer: Self-pay | Admitting: Family Medicine

## 2015-10-13 DIAGNOSIS — M79604 Pain in right leg: Secondary | ICD-10-CM

## 2015-10-13 MED ORDER — OXYCODONE HCL 5 MG PO TABS: 10.0000 mg | ORAL_TABLET | Freq: Four times a day (QID) | ORAL | Status: DC | PRN

## 2015-10-13 NOTE — Telephone Encounter (Signed)
Pt contacted office for refill request on the following medications:  oxyCODONE (OXY IR/ROXICODONE) 5 MG immediate release tablet.  CB#[phone]/MW °

## 2015-10-20 ENCOUNTER — Telehealth: Payer: Self-pay | Admitting: Cardiovascular Disease

## 2015-10-20 NOTE — Telephone Encounter (Signed)
See if he wants to come over for a nurse visit this afternoon.

## 2015-10-20 NOTE — Telephone Encounter (Signed)
Pt states he has felt "twitching" for the last couple days. Denies any CP. Would like to see if he can come in for and EKG. Please call and advise

## 2015-10-21 ENCOUNTER — Emergency Department
Admission: EM | Admit: 2015-10-21 | Discharge: 2015-10-21 | Disposition: A | Discharge status: Home / Self Care | Payer: Medicare Other | Attending: Emergency Medicine | Admitting: Emergency Medicine

## 2015-10-21 ENCOUNTER — Ambulatory Visit: Admission: RE | Admit: 2015-10-21 | Payer: Medicare Other | Source: Ambulatory Visit

## 2015-10-21 DIAGNOSIS — Z7982 Long term (current) use of aspirin: Secondary | ICD-10-CM | POA: Diagnosis not present

## 2015-10-21 DIAGNOSIS — Z5321 Procedure and treatment not carried out due to patient leaving prior to being seen by health care provider: Secondary | ICD-10-CM | POA: Diagnosis not present

## 2015-10-21 DIAGNOSIS — I10 Essential (primary) hypertension: Secondary | ICD-10-CM | POA: Insufficient documentation

## 2015-10-21 DIAGNOSIS — E119 Type 2 diabetes mellitus without complications: Secondary | ICD-10-CM | POA: Diagnosis not present

## 2015-10-21 DIAGNOSIS — E039 Hypothyroidism, unspecified: Secondary | ICD-10-CM | POA: Diagnosis not present

## 2015-10-21 DIAGNOSIS — Z794 Long term (current) use of insulin: Secondary | ICD-10-CM | POA: Insufficient documentation

## 2015-10-21 DIAGNOSIS — F17210 Nicotine dependence, cigarettes, uncomplicated: Secondary | ICD-10-CM | POA: Insufficient documentation

## 2015-10-21 DIAGNOSIS — R079 Chest pain, unspecified: Secondary | ICD-10-CM | POA: Diagnosis present

## 2015-10-21 LAB — COMPREHENSIVE METABOLIC PANEL
ALT: 32 U/L (ref 17–63)
AST: 21 U/L (ref 15–41)
Albumin: 4.7 g/dL (ref 3.5–5.0)
Alkaline Phosphatase: 188 U/L — ABNORMAL HIGH (ref 38–126)
Anion gap: 6 (ref 5–15)
BUN: 34 mg/dL — ABNORMAL HIGH (ref 6–20)
CO2: 24 mmol/L (ref 22–32)
Calcium: 10.6 mg/dL — ABNORMAL HIGH (ref 8.9–10.3)
Chloride: 106 mmol/L (ref 101–111)
Creatinine, Ser: 2.18 mg/dL — ABNORMAL HIGH (ref 0.61–1.24)
GFR calc Af Amer: 39 mL/min — ABNORMAL LOW (ref >60)
GFR calc non Af Amer: 33 mL/min — ABNORMAL LOW (ref >60)
Glucose, Bld: 205 mg/dL — ABNORMAL HIGH (ref 65–99)
Potassium: 5.3 mmol/L — ABNORMAL HIGH (ref 3.5–5.1)
Sodium: 136 mmol/L (ref 135–145)
Total Bilirubin: 0.8 mg/dL (ref 0.3–1.2)
Total Protein: 7.6 g/dL (ref 6.5–8.1)

## 2015-10-21 LAB — CBC
HCT: 46.7 % (ref 40.0–52.0)
Hemoglobin: 16.3 g/dL (ref 13.0–18.0)
MCH: 32.2 pg (ref 26.0–34.0)
MCHC: 34.8 g/dL (ref 32.0–36.0)
MCV: 92.6 fL (ref 80.0–100.0)
Platelets: 140 10*3/uL — ABNORMAL LOW (ref 150–440)
RBC: 5.04 MIL/uL (ref 4.40–5.90)
RDW: 14.1 % (ref 11.5–14.5)
WBC: 8.9 10*3/uL (ref 3.8–10.6)

## 2015-10-21 LAB — TROPONIN I: Troponin I: <0.03 ng/mL (ref <0.03)

## 2015-10-21 NOTE — Telephone Encounter (Signed)
Patient says he went to [facility] and his ekg and labs were fine.  He stated it could have been he had indigestion.    Patient says he will see [place] at the 8/15 fu with [facility]

## 2015-10-21 NOTE — ED Triage Notes (Signed)
Pt reports under stress yesterday and developed some "twinging in his chest" pt states that he is a kidney recipient and is diabetic and has htn.. Pt states that he had another twinge today and states that he decided to get checked

## 2015-10-26 ENCOUNTER — Telehealth: Payer: Self-pay | Admitting: Emergency Medicine

## 2015-10-26 ENCOUNTER — Other Ambulatory Visit: Payer: Self-pay | Admitting: Family Medicine

## 2015-10-26 DIAGNOSIS — M79604 Pain in right leg: Secondary | ICD-10-CM

## 2015-10-26 MED ORDER — OXYCODONE HCL 5 MG PO TABS: 10.0000 mg | ORAL_TABLET | Freq: Four times a day (QID) | ORAL | 0 refills | Status: DC | PRN

## 2015-10-26 NOTE — Telephone Encounter (Signed)
Pt needs refill on his oxycodone.  Please call when ready [phone].  Thanks Con Memos

## 2015-10-26 NOTE — Telephone Encounter (Signed)
Called patient due to lwot to inquire about condition and follow up plans. Pt says he has already planned folow up with dr Rockey Situ.  He will let dr Rockey Situ know we did labs and ekg here.

## 2015-11-02 ENCOUNTER — Other Ambulatory Visit: Payer: Self-pay | Admitting: Family Medicine

## 2015-11-02 NOTE — Telephone Encounter (Signed)
Pt called for refill on generic viagra 100mg .   [facility]. Pharmacy  Thanks Con Memos

## 2015-11-07 ENCOUNTER — Encounter: Payer: Self-pay | Admitting: Family Medicine

## 2015-11-07 ENCOUNTER — Ambulatory Visit (INDEPENDENT_AMBULATORY_CARE_PROVIDER_SITE_OTHER): Payer: Medicare Other | Admitting: Family Medicine

## 2015-11-07 VITALS — BP 140/78 | HR 87 | Temp 98.3°F | Resp 16 | Wt 217.0 lb

## 2015-11-07 DIAGNOSIS — I151 Hypertension secondary to other renal disorders: Secondary | ICD-10-CM | POA: Diagnosis not present

## 2015-11-07 DIAGNOSIS — N2889 Other specified disorders of kidney and ureter: Secondary | ICD-10-CM | POA: Diagnosis not present

## 2015-11-07 DIAGNOSIS — M79604 Pain in right leg: Secondary | ICD-10-CM | POA: Diagnosis not present

## 2015-11-07 DIAGNOSIS — Z7189 Other specified counseling: Secondary | ICD-10-CM

## 2015-11-07 DIAGNOSIS — E785 Hyperlipidemia, unspecified: Secondary | ICD-10-CM

## 2015-11-07 DIAGNOSIS — G8929 Other chronic pain: Secondary | ICD-10-CM

## 2015-11-07 DIAGNOSIS — L905 Scar conditions and fibrosis of skin: Secondary | ICD-10-CM

## 2015-11-07 DIAGNOSIS — R52 Pain, unspecified: Secondary | ICD-10-CM

## 2015-11-07 MED ORDER — OXYCODONE HCL 5 MG PO TABS: 10.0000 mg | ORAL_TABLET | Freq: Four times a day (QID) | ORAL | 0 refills | Status: DC | PRN

## 2015-11-07 NOTE — Progress Notes (Signed)
Patient: James Moreno Male    DOB: 06-09-1965   50 y.o.   MRN: [id number] Visit Date: 11/07/2015  Today's Provider: Lelon Huh, MD   Chief Complaint  Patient presents with  . Pain    follow up   . Follow-up   Subjective:    HPI Follow up of Chronic pain: Patient was last seen on 03/18/2015. Changes made during that visit includes prescribing Lidoderm patches. Patient was also advised to work on weaning down Oxycodone.  Patient reports fair compliance with treatment, good tolerance and fair symptom control. Patient states the Lidoderm patches did not help with pain management, so he stopped using them. Patient states he has been taking Oxycodone to help with pain. States his back has not been bothering him lately, but having constant right inguinal and RLQ around surgical sites.    Follow up ER visit  Patient was seen in ER for Chest pain on 10/21/2015. Patient was advised to follow up with Dr. Rockey Situ. Patient has an appointment to see Dr. Rockey Situ tomorrow at 11:20am.  He reports good compliance with treatment. He reports this condition is Resolved.  ------------------------------------------------------------------------------------   Follow up hyperlidemia  He continues lovastatin which he is tolerating well without adverse effect. Is due for lipid panel.     No Known Allergies Current Meds  Medication Sig  . alprazolam (XANAX) 2 MG tablet Take 1/2 to 1 Tablet 3 Times Daily As Needed  . aspirin 81 MG tablet Take 81 mg by mouth daily.  . carvedilol (COREG) 6.25 MG tablet TAKE ONE TABLET BY MOUTH 2 TIMES A DAY WITH A MEAL  . insulin aspart (NOVOLOG) 100 UNIT/ML injection Inject 10 Units into the skin 3 (three) times daily before meals. Sliding scale  . Insulin Glargine (TOUJEO SOLOSTAR [place]) Inject 50 Units into the skin daily.   Marland Kitchen lidocaine (LIDODERM) 5 % Place 1 patch onto the skin daily. Remove & Discard patch within 12 hours or as directed by MD  . lovastatin  (MEVACOR) 40 MG tablet Take 40 mg by mouth at bedtime.  . mycophenolate (MYFORTIC) 180 MG EC tablet Take 180 mg by mouth 2 (two) times daily.  Marland Kitchen omeprazole (PRILOSEC) 20 MG capsule Take 20 mg by mouth daily.  Marland Kitchen oxyCODONE (OXY IR/ROXICODONE) 5 MG immediate release tablet Take 2 tablets (10 mg total) by mouth every 6 (six) hours as needed for severe pain. PATIENT NEEDS OFFICE VISIT FOR FOLLOW UP  . sildenafil (REVATIO) 20 MG tablet TAKE TWO TABLETS AS NEEDED FOR E.D.  . sildenafil (VIAGRA) 100 MG tablet Take 0.5-1 tablets (50-100 mg total) by mouth daily as needed for erectile dysfunction.  . tacrolimus (PROGRAF) 1 MG capsule Takes 5 tablets every morning and 4 tablets every evening    Review of Systems  Constitutional: Negative for appetite change, chills, fatigue and fever.  HENT: Negative for congestion, ear pain, hearing loss, nosebleeds and trouble swallowing.   Eyes: Negative for pain and visual disturbance.  Respiratory: Negative for cough, chest tightness, shortness of breath and wheezing.   Cardiovascular: Negative for chest pain, palpitations and leg swelling.  Gastrointestinal: Negative for abdominal pain, blood in stool, constipation, diarrhea, nausea and vomiting.  Endocrine: Negative for polydipsia, polyphagia and polyuria.  Genitourinary: Negative for dysuria and flank pain.  Musculoskeletal: Positive for back pain. Negative for joint swelling, myalgias and neck stiffness.  Skin: Negative for color change, rash and wound.  Neurological: Negative for dizziness, tremors, seizures, speech difficulty, weakness,  light-headedness and headaches.  Psychiatric/Behavioral: Negative for behavioral problems, confusion, decreased concentration, dysphoric mood and sleep disturbance. The patient is not nervous/anxious.   All other systems reviewed and are negative.   Social History  Substance Use Topics  . Smoking status: Current Some Day Smoker    Packs/day: 0.50    Years: 31.00     Types: Cigarettes  . Smokeless tobacco: Former Systems developer    Quit date: 11/26/2011     Comment: Quit 3 months ago  . Alcohol use No     Comment: Excessive alcohol consumption in the past    Objective:   BP 140/78 (BP Location: Left Arm, Patient Position: Sitting, Cuff Size: Large)   Pulse 87   Temp 98.3 F (36.8 C) (Oral)   Resp 16   Wt 217 lb (98.4 kg)   SpO2 96% Comment: room air  BMI 30.27 kg/m   Physical Exam   General Appearance:    Alert, cooperative, no distress, obese  Eyes:    PERRL, conjunctiva/corneas clear, EOM's intact       Lungs:     Clear to auscultation bilaterally, respirations unlabored  Heart:    Regular rate and rhythm  Neurologic:   Awake, alert, oriented x 3. No apparent focal neurological           defect.           Assessment & Plan:     1. Hyperlipidemia He is tolerating lovastatin well with no adverse effects.   - Lipid panel  2. Hypertension secondary to other renal disorders Stable. Continue current medications.    3. Pain of right lower extremity Counseled on minimizing dose of opioids with goal of making pain tolerable.  - oxyCODONE (OXY IR/ROXICODONE) 5 MG immediate release tablet; Take 2 tablets (10 mg total) by mouth every 6 (six) hours as needed for severe pain. Dispense: 120 tablet; Refill: 0  4. Encounter for chronic pain management  - Pain [facility]gt [facility] ([facility]), Ur  5. Pain in surgical scar  - oxyCODONE (OXY IR/ROXICODONE) 5 MG immediate release tablet; Take 2 tablets (10 mg total) by mouth every 6 (six) hours as needed for severe pain. Dispense: 120 tablet; Refill: 0        Lelon Huh, MD  [facility]

## 2015-11-08 ENCOUNTER — Ambulatory Visit (INDEPENDENT_AMBULATORY_CARE_PROVIDER_SITE_OTHER): Payer: Medicare Other | Admitting: Cardiovascular Disease

## 2015-11-08 ENCOUNTER — Encounter: Payer: Self-pay | Admitting: Cardiovascular Disease

## 2015-11-08 VITALS — BP 130/72 | HR 95 | Ht 71.0 in | Wt 216.0 lb

## 2015-11-08 DIAGNOSIS — R079 Chest pain, unspecified: Secondary | ICD-10-CM

## 2015-11-08 DIAGNOSIS — R000 Tachycardia, unspecified: Secondary | ICD-10-CM

## 2015-11-08 DIAGNOSIS — I6529 Occlusion and stenosis of unspecified carotid artery: Secondary | ICD-10-CM

## 2015-11-08 DIAGNOSIS — E1121 Type 2 diabetes mellitus with diabetic nephropathy: Secondary | ICD-10-CM | POA: Diagnosis not present

## 2015-11-08 DIAGNOSIS — E785 Hyperlipidemia, unspecified: Secondary | ICD-10-CM

## 2015-11-08 DIAGNOSIS — Z720 Tobacco use: Secondary | ICD-10-CM

## 2015-11-08 DIAGNOSIS — F17200 Nicotine dependence, unspecified, uncomplicated: Secondary | ICD-10-CM

## 2015-11-08 MED ORDER — VARENICLINE TARTRATE 1 MG PO TABS: 1.0000 mg | ORAL_TABLET | Freq: Two times a day (BID) | ORAL | 3 refills | Status: DC

## 2015-11-08 NOTE — Patient Instructions (Addendum)
Medication Instructions:   Please start chantix Stay on it for 3 months  Labwork:  No new labs  Testing/Procedures:  Carotid ultrasound for known carotid stenosis in sept 2017  If you have more chest pains, Call the office    Follow-Up: It was a pleasure seeing you in the office today. Please call us if you have new issues that need to be addressed before your next appt.  [phone]  Your physician wants you to follow-up in: 12 months.  You will receive a reminder letter in the mail two months in advance. If you don't receive a letter, please call our office to schedule the follow-up appointment.  If you need a refill on your cardiac medications before your next appointment, please call your pharmacy.     Steps to Quit Smoking  Smoking tobacco can be harmful to your health and can affect almost every organ in your body. Smoking puts you, and those around you, at risk for developing many serious chronic diseases. Quitting smoking is difficult, but it is one of the best things that you can do for your health. It is never too late to quit. WHAT ARE THE BENEFITS OF QUITTING SMOKING? When you quit smoking, you lower your risk of developing serious diseases and conditions, such as:  Lung cancer or lung disease, such as COPD.  Heart disease.  Stroke.  Heart attack.  Infertility.  Osteoporosis and bone fractures. Additionally, symptoms such as coughing, wheezing, and shortness of breath may get better when you quit. You may also find that you get sick less often because your body is stronger at fighting off colds and infections. If you are pregnant, quitting smoking can help to reduce your chances of having a baby of low birth weight. HOW DO I GET READY TO QUIT? When you decide to quit smoking, create a plan to make sure that you are successful. Before you quit:  Pick a date to quit. Set a date within the next two weeks to give you time to prepare.  Write down the  reasons why you are quitting. Keep this list in places where you will see it often, such as on your bathroom mirror or in your car or wallet.  Identify the people, places, things, and activities that make you want to smoke (triggers) and avoid them. Make sure to take these actions:  Throw away all cigarettes at home, at work, and in your car.  Throw away smoking accessories, such as Scientist, research (medical).  Clean your car and make sure to empty the ashtray.  Clean your home, including curtains and carpets.  Tell your family, friends, and coworkers that you are quitting. Support from your loved ones can make quitting easier.  Talk with your health care provider about your options for quitting smoking.  Find out what treatment options are covered by your health insurance. WHAT STRATEGIES CAN I USE TO QUIT SMOKING?  Talk with your healthcare provider about different strategies to quit smoking. Some strategies include:  Quitting smoking altogether instead of gradually lessening how much you smoke over a period of time. Research shows that quitting "cold [place]" is more successful than gradually quitting.  Attending in-person counseling to help you build problem-solving skills. You are more likely to have success in quitting if you attend several counseling sessions. Even short sessions of 10 minutes can be effective.  Finding resources and support systems that can help you to quit smoking and remain smoke-free after you quit. These resources are  most helpful when you use them often. They can include:  Online chats with a Social worker.  Telephone quitlines.  Printed Furniture conservator/restorer.  Support groups or group counseling.  Text messaging programs.  Mobile phone applications.  Taking medicines to help you quit smoking. (If you are pregnant or breastfeeding, talk with your health care provider first.) Some medicines contain nicotine and some do not. Both types of medicines help with  cravings, but the medicines that include nicotine help to relieve withdrawal symptoms. Your health care provider may recommend:  Nicotine patches, gum, or lozenges.  Nicotine inhalers or sprays.  Non-nicotine medicine that is taken by mouth. Talk with your health care provider about combining strategies, such as taking medicines while you are also receiving in-person counseling. Using these two strategies together makes you more likely to succeed in quitting than if you used either strategy on its own. If you are pregnant or breastfeeding, talk with your health care provider about finding counseling or other support strategies to quit smoking. Do not take medicine to help you quit smoking unless told to do so by your health care provider. WHAT THINGS CAN I DO TO MAKE IT EASIER TO QUIT? Quitting smoking might feel overwhelming at first, but there is a lot that you can do to make it easier. Take these important actions:  Reach out to your family and friends and ask that they support and encourage you during this time. Call telephone quitlines, reach out to support groups, or work with a counselor for support.  Ask people who smoke to avoid smoking around you.  Avoid places that trigger you to smoke, such as bars, parties, or smoke-break areas at work.  Spend time around people who do not smoke.  Lessen stress in your life, because stress can be a smoking trigger for some people. To lessen stress, try:  Exercising regularly.  Deep-breathing exercises.  Yoga.  Meditating.  Performing a body scan. This involves closing your eyes, scanning your body from head to toe, and noticing which parts of your body are particularly tense. Purposefully relax the muscles in those areas.  Download or purchase mobile phone or tablet apps (applications) that can help you stick to your quit plan by providing reminders, tips, and encouragement. There are many free apps, such as QuitGuide from the [employer]  Office manager for Disease Control and Prevention). You can find other support for quitting smoking (smoking cessation) through smokefree.gov and other websites. HOW WILL I FEEL WHEN I QUIT SMOKING? Within the first 24 hours of quitting smoking, you may start to feel some withdrawal symptoms. These symptoms are usually most noticeable 2-3 days after quitting, but they usually do not last beyond 2-3 weeks. Changes or symptoms that you might experience include:  Mood swings.  Restlessness, anxiety, or irritation.  Difficulty concentrating.  Dizziness.  Strong cravings for sugary foods in addition to nicotine.  Mild weight gain.  Constipation.  Nausea.  Coughing or a sore throat.  Changes in how your medicines work in your body.  A depressed mood.  Difficulty sleeping (insomnia). After the first 2-3 weeks of quitting, you may start to notice more positive results, such as:  Improved sense of smell and taste.  Decreased coughing and sore throat.  Slower heart rate.  Lower blood pressure.  Clearer skin.  The ability to breathe more easily.  Fewer sick days. Quitting smoking is very challenging for most people. Do not get discouraged if you are not successful the first time. Some people  need to make many attempts to quit before they achieve long-term success. Do your best to stick to your quit plan, and talk with your health care provider if you have any questions or concerns.   This information is not intended to replace advice given to you by your health care provider. Make sure you discuss any questions you have with your health care provider.   Document Released: 03/06/2001 Document Revised: 07/27/2014 Document Reviewed: 07/27/2014 Elsevier Interactive Patient Education 2016 [employer]. Smoking Hazards Smoking cigarettes is extremely bad for your health. Tobacco smoke has over 200 known poisons in it. It contains the poisonous gases nitrogen oxide and carbon monoxide.  There are over 60 chemicals in tobacco smoke that cause cancer. Some of the chemicals found in cigarette smoke include:   Cyanide.   Benzene.   Formaldehyde.   Methanol (wood alcohol).   Acetylene (fuel used in welding torches).   Ammonia.  Even smoking lightly shortens your life expectancy by several years. You can greatly reduce the risk of medical problems for you and your family by stopping now. Smoking is the most preventable cause of death and disease in our society. Within days of quitting smoking, your circulation improves, you decrease the risk of having a heart attack, and your lung capacity improves. There may be some increased phlegm in the first few days after quitting, and it may take months for your lungs to clear up completely. Quitting for 10 years reduces your risk of developing lung cancer to almost that of a nonsmoker.  WHAT ARE THE RISKS OF SMOKING? Cigarette smokers have an increased risk of many serious medical problems, including:  Lung cancer.   Lung disease (such as pneumonia, bronchitis, and emphysema).   Heart attack and chest pain due to the heart not getting enough oxygen (angina).   Heart disease and peripheral blood vessel disease.   Hypertension.   Stroke.   Oral cancer (cancer of the lip, mouth, or voice box).   Bladder cancer.   Pancreatic cancer.   Cervical cancer.   Pregnancy complications, including premature birth.   Stillbirths and smaller newborn babies, birth defects, and genetic damage to sperm.   Early menopause.   Lower estrogen level for women.   Infertility.   Facial wrinkles.   Blindness.   Increased risk of broken bones (fractures).   Senile dementia.   Stomach ulcers and internal bleeding.   Delayed wound healing and increased risk of complications during surgery. Because of secondhand smoke exposure, children of smokers have an increased risk of the following:   Sudden infant death  syndrome (SIDS).   Respiratory infections.   Lung cancer.   Heart disease.   Ear infections.  WHY IS SMOKING ADDICTIVE? Nicotine is the chemical agent in tobacco that is capable of causing addiction or dependence. When you smoke and inhale, nicotine is absorbed rapidly into the bloodstream through your lungs. Both inhaled and noninhaled nicotine may be addictive.  WHAT ARE THE BENEFITS OF QUITTING?  There are many health benefits to quitting smoking. Some are:   The likelihood of developing cancer and heart disease decreases. Health improvements are seen almost immediately.   Blood pressure, pulse rate, and breathing patterns start returning to normal soon after quitting.   People who quit may see an improvement in their overall quality of life.  HOW DO YOU QUIT SMOKING? Smoking is an addiction with both physical and psychological effects, and longtime habits can be hard to change. Your health care provider  can recommend:  Programs and community resources, which may include group support, education, or therapy.  Replacement products, such as patches, gum, and nasal sprays. Use these products only as directed. Do not replace cigarette smoking with electronic cigarettes (commonly called e-cigarettes). The safety of e-cigarettes is unknown, and some may contain harmful chemicals. FOR MORE INFORMATION  American Lung Association: www.lung.org  American Cancer Society: www.cancer.org   This information is not intended to replace advice given to you by your health care provider. Make sure you discuss any questions you have with your health care provider.   Document Released: 04/19/2004 Document Revised: 12/31/2012 Document Reviewed: 09/01/2012 Elsevier Interactive Patient Education 2016 [employer] WHAT IS SECONDHAND SMOKE? Secondhand smoke is smoke that comes from burning tobacco. It could be the smoke from a cigarette, a pipe, or a cigar. Even if you are  not the one smoking, secondhand smoke exposes you to the dangers of smoking. This is called involuntary, or passive, smoking. There are two types of secondhand smoke:  Sidestream smoke is the smoke that comes off the lighted end of a cigarette, pipe, or cigar.  This type of smoke has the highest amount of cancer-causing agents (carcinogens).  The particles in sidestream smoke are smaller. They get into your lungs more easily.  Mainstream smoke is the smoke that is exhaled by a person who is smoking.  This type of smoke is also dangerous to your health. HOW CAN SECONDHAND SMOKE AFFECT MY HEALTH? Studies show that there is no safe level of secondhand smoke. This smoke contains thousands of chemicals. At least 7 of them are known to cause cancer. Secondhand smoke can also cause many other health problems. It has been linked to:  Lung cancer.  Cancer of the voice box (larynx) or throat.  Cancer of the sinuses.  Brain cancer.  Bladder cancer.  Stomach cancer.  Breast cancer.  White blood cell cancers (lymphoma and leukemia).  Brain and liver tumors in children.  Heart disease and stroke in adults.  Pregnancy loss (miscarriage).  Diseases in children, such as:  Asthma.  Lung infections.  Ear infections.  Sudden infant death syndrome (SIDS).  Slow growth. WHERE CAN I BE AT RISK FOR EXPOSURE TO SECONDHAND SMOKE?   For adults, the workplace is the main source of exposure to secondhand smoke.  Your workplace should have a policy separating smoking areas from nonsmoking areas.  Smoking areas should have a system for ventilating and cleaning the air.  For children, the home may be the most dangerous place for exposure to secondhand smoke.  Children who live in apartment buildings may be at risk from smoke drifting from hallways or other people's homes.  For everyone, many public places are possible sources of exposure to secondhand smoke.  These places include  restaurants, shopping centers, and parks. HOW CAN I REDUCE MY RISK FOR EXPOSURE TO SECONDHAND SMOKE? The most important thing you can do is not smoke. Discourage family members from smoking. Other ways to reduce exposure for you and your family include the following:  Keep your home smoke free.  Make sure your child care providers do not smoke.  Warn your child about the dangers of smoking and secondhand smoke.  Do not allow smoking in your car. When someone smokes in a car, all the damaging chemicals from the smoke are confined in a small area.  Avoid public places where smoking is allowed.   This information is not intended to replace advice given to  you by your health care provider. Make sure you discuss any questions you have with your health care provider.   Document Released: 04/19/2004 Document Revised: 04/02/2014 Document Reviewed: 06/26/2013 Elsevier Interactive Patient Education [employer].

## 2015-11-08 NOTE — Progress Notes (Signed)
Cardiology Office Note  Date:  11/08/2015   ID:  RENDON ATONDO, DOB [birth year], MRN [id number]  PCP:  Lelon Huh, MD   Chief Complaint  Patient presents with  . Other    12 month follow up. Meds reviewed by the patient verbally. Pt. went to  [facility] ER about two weeks ago with chest pain, denies any chest pain since the ER visit.     HPI:  Mr. Bobier is a pleasant 50 year old gentleman with a history of diabetes, kidney transplant April 2016 in [place], hypertension , long smoking history who continues to smoke, previously on peritoneal hemodialysis,  evaluated for chest pain in the past, presenting for routine followup of his PAD.  Bilateral carotid disease,  60-70% bilaterally in 2015  twinges in chest 2 weeks ago, Went to the ER, EKG no change, did not stay Was under stress, kids/family stuff If he eats a late meal, lays down, had chest tightness, Better in a recliner Next day was working in the garden, sharp pokes No sx since then  Did stress test in the past on treadmill, had trouble Also did lexiscan 2015, had severe SOB  Eating better, active  EKG showing normal sinus rhythm with rate 96 bpm, poor R-wave progression through the anterior precordial leads, no change from prior EKG in 2016  Other past medical history  previously required iron infusions   history of  chronic discomfort in his legs. Reports having ABIs with Dr. Francoise Schaumann  Echocardiogram over the past 6 months was essentially normal, normal ejection fraction estimated at greater than 55% . This was done in preparation for kidney transplant listing .    stress test in 2013 showed no ischemia, done at [facility], repeat stress test in the past month or so at [facility]  PMH:   has a past medical history of Acute kidney failure, unspecified ([place]); Carotid artery occlusion; Diabetes mellitus without complication (HCC); GERD (gastroesophageal reflux disease); History of hepatitis B; Hypertension;  Hypothyroidism; Mitral valve disorders; Primary pulmonary HTN ([place]); and Tricuspid valve disorders, specified as nonrheumatic.  PSH:    Past Surgical History:  Procedure Laterality Date  . APPENDECTOMY    . Carotid Doppler Ultrasound  06/14/2009   39% stenosis of bilateral internal carotid artery, bilateral anterograde vertebral flow  . EYE SURGERY     right  . HEMORROIDECTOMY    . KIDNEY TRANSPLANT Right 2016  . MECKEL DIVERTICULUM EXCISION     infancy  . Myocardial Perfusion scan  01/17/2009   [facility], non- ischemic. LVEF= 55%  . PARS PLANA VITRECTOMY Left 02/09/2015   Procedure: Pan retinal photocoagulation [id number];  Surgeon: Milus Height, MD;  Location: [facility] ORS;  Service: Ophthalmology;  Laterality: Left;  . REFRACTIVE SURGERY Left   . sleep study  01/09/2011   Severe sleep apnea. AHI 72.9/hr. RDI=83.0/hr. Desaturation to 69.0% Emergency CPAP titaration to 14.0cm    Current Outpatient Prescriptions  Medication Sig Dispense Refill  . alprazolam (XANAX) 2 MG tablet Take 1/2 to 1 Tablet 3 Times Daily As Needed 90 tablet 5  . carvedilol (COREG) 6.25 MG tablet TAKE ONE TABLET BY MOUTH 2 TIMES A DAY WITH A MEAL 180 tablet 3  . cinacalcet (SENSIPAR) 30 MG tablet Take 30 mg by mouth daily.    . insulin aspart (NOVOLOG) 100 UNIT/ML injection Inject 10 Units into the skin 3 (three) times daily before meals. Sliding scale    . Insulin Glargine (TOUJEO SOLOSTAR [place]) Inject 50 Units into the skin  daily.     . losartan (COZAAR) 25 MG tablet Take 25 mg by mouth daily.    Marland Kitchen lovastatin (MEVACOR) 40 MG tablet Take 40 mg by mouth at bedtime.    . mycophenolate (MYFORTIC) 180 MG EC tablet Take 180 mg by mouth 2 (two) times daily.    Marland Kitchen omeprazole (PRILOSEC) 20 MG capsule Take 20 mg by mouth daily.    Marland Kitchen oxyCODONE (OXY IR/ROXICODONE) 5 MG immediate release tablet Take 2 tablets (10 mg total) by mouth every 6 (six) hours as needed for severe pain. PATIENT NEEDS OFFICE VISIT FOR FOLLOW UP 120  tablet 0  . sildenafil (REVATIO) 20 MG tablet TAKE TWO TABLETS AS NEEDED FOR E.D. 50 tablet 3  . sildenafil (VIAGRA) 100 MG tablet Take 0.5-1 tablets (50-100 mg total) by mouth daily as needed for erectile dysfunction. 30 tablet 1  . tacrolimus (PROGRAF) 1 MG capsule Takes 5 tablets every morning and 4 tablets every evening  11  . varenicline (CHANTIX CONTINUING MONTH PAK) 1 MG tablet Take 1 tablet (1 mg total) by mouth 2 (two) times daily. 60 tablet 3   No current facility-administered medications for this visit.      Allergies:   Review of patient's allergies indicates no known allergies.   Social History:  The patient  reports that he has been smoking Cigarettes.  He has a 15.50 pack-year smoking history. He quit smokeless tobacco use about 3 years ago. He reports that he does not drink alcohol or use drugs.   Family History:   family history includes Hyperlipidemia in his mother; Hypertension in his mother; Melanoma in his father.    Review of Systems: Review of Systems  Constitutional: Negative.   Respiratory: Negative.   Cardiovascular: Positive for chest pain.  Gastrointestinal: Negative.   Musculoskeletal: Negative.   Neurological: Negative.   Psychiatric/Behavioral: Negative.   All other systems reviewed and are negative.    PHYSICAL EXAM: VS:  BP 130/72 (BP Location: Left Arm, Patient Position: Sitting, Cuff Size: Normal)   Pulse 95   Ht 5\' 11"  (1.803 m)   Wt 216 lb (98 kg)   BMI 30.13 kg/m  , BMI Body mass index is 30.13 kg/m. GEN: Well nourished, well developed, in no acute distress  HEENT: normal  Neck: no JVD, carotid bruits, or masses Cardiac: RRR; no murmurs, rubs, or gallops,no edema  Respiratory:  clear to auscultation bilaterally, normal work of breathing GI: soft, nontender, nondistended, + BS MS: no deformity or atrophy  Skin: warm and dry, no rash Neuro:  Strength and sensation are intact Psych: euthymic mood, full affect -   Recent  Labs: 10/21/2015: ALT 32; BUN 34; Creatinine, Ser 2.18; Hemoglobin 16.3; Platelets 140; Potassium 5.3; Sodium 136    Lipid Panel Lab Results  Component Value Date   CHOL 205 (A) 04/21/2010   HDL 43 04/21/2010   LDLCALC 111 04/21/2010   TRIG 255 (A) 04/21/2010      Wt Readings from Last 3 Encounters:  11/08/15 216 lb (98 kg)  11/07/15 217 lb (98.4 kg)  10/21/15 215 lb (97.5 kg)       ASSESSMENT AND PLAN:  Carotid stenosis, unspecified laterality - Plan: VAS US CAROTID Stable over the past several years, repeat ultrasound next month  Hyperlipidemia Cholesterol is pending Ate breakfast this morning, reports that this will be checked with primary care  Tachycardia Heart rate relatively well-controlled, tolerating carvedilol  Type 2 diabetes mellitus with diabetic nephropathy, without long-term current use of  insulin ([place]) Reports he has had dramatic weight loss, watching his diet No regular exercise program  Chest pain with high risk for cardiac etiology Long discussion concerning recent episode of chest pain Hospital records reviewed Atypical in nature Last episode one month ago, none since then even with exertion Long discussion concerning repeat stress testing He will prefer not to do a stress tests. If symptoms recur, he has indicated he prefers cardiac catheterization  Smoker Prescription provided for Chantix after long discussion   Total encounter time more than 25 minutes  Greater than 50% was spent in counseling and coordination of care with the patient   Disposition:   F/U  6 months  No orders of the defined types were placed in this encounter.    Signed, Esmond Plants, M.D., Ph.D. 11/08/2015  [place], [place]

## 2015-11-09 LAB — PAIN MGT SCRN (14 DRUGS), UR
Amphetamine Screen, Ur: NEGATIVE ng/mL
Barbiturate Screen, Ur: NEGATIVE ng/mL
Benzodiazepine Screen, Urine: NEGATIVE ng/mL
Buprenorphine, Urine: NEGATIVE ng/mL
Cannabinoids Ur Ql Scn: NEGATIVE ng/mL
Cocaine(Metab.)Screen, Urine: NEGATIVE ng/mL
Creatinine(Crt), U: 168.9 mg/dL (ref 20.0–300.0)
Fentanyl, Urine: NEGATIVE pg/mL
Meperidine Screen, Urine: NEGATIVE ng/mL
Methadone Scn, Ur: NEGATIVE ng/mL
Opiate Scrn, Ur: POSITIVE ng/mL
Oxycodone+Oxymorphone Ur Ql Scn: POSITIVE ng/mL
PCP Scrn, Ur: NEGATIVE ng/mL
Ph of Urine: 5.2 (ref 4.5–8.9)
Propoxyphene, Screen: NEGATIVE ng/mL
Tramadol Ur Ql Scn: NEGATIVE ng/mL

## 2015-11-21 ENCOUNTER — Other Ambulatory Visit: Payer: Self-pay | Admitting: Family Medicine

## 2015-11-21 DIAGNOSIS — L905 Scar conditions and fibrosis of skin: Secondary | ICD-10-CM

## 2015-11-21 DIAGNOSIS — M79604 Pain in right leg: Secondary | ICD-10-CM

## 2015-11-21 DIAGNOSIS — R52 Pain, unspecified: Secondary | ICD-10-CM

## 2015-11-21 MED ORDER — OXYCODONE HCL 5 MG PO TABS: 10.0000 mg | ORAL_TABLET | Freq: Four times a day (QID) | ORAL | 0 refills | Status: DC | PRN

## 2015-11-21 NOTE — Telephone Encounter (Signed)
Pt contacted office for refill request on the following medications: oxyCODONE (OXY IR/ROXICODONE) 5 MG immediate release tablet  Last written: 11/07/15 Last OV: 11/07/15 Please advise. Thanks TNP

## 2015-11-30 ENCOUNTER — Other Ambulatory Visit: Payer: Self-pay | Admitting: Family Medicine

## 2015-11-30 DIAGNOSIS — R52 Pain, unspecified: Secondary | ICD-10-CM

## 2015-11-30 DIAGNOSIS — M79604 Pain in right leg: Secondary | ICD-10-CM

## 2015-11-30 DIAGNOSIS — L905 Scar conditions and fibrosis of skin: Secondary | ICD-10-CM

## 2015-11-30 NOTE — Telephone Encounter (Signed)
Pt needs refill on his pain medication.    Oxycodone   Thanks Con Memos

## 2015-12-01 NOTE — Telephone Encounter (Signed)
Pt called to see if RX was ready for pick up. Please advise. Thanks TNP

## 2015-12-02 MED ORDER — OXYCODONE HCL 5 MG PO TABS: 10.0000 mg | ORAL_TABLET | Freq: Four times a day (QID) | ORAL | 0 refills | Status: DC | PRN

## 2015-12-12 ENCOUNTER — Other Ambulatory Visit: Payer: Self-pay | Admitting: Nephrology

## 2015-12-12 DIAGNOSIS — E0590 Thyrotoxicosis, unspecified without thyrotoxic crisis or storm: Secondary | ICD-10-CM

## 2015-12-13 ENCOUNTER — Other Ambulatory Visit: Payer: Self-pay | Admitting: Family Medicine

## 2015-12-13 DIAGNOSIS — R52 Pain, unspecified: Secondary | ICD-10-CM

## 2015-12-13 DIAGNOSIS — L905 Scar conditions and fibrosis of skin: Secondary | ICD-10-CM

## 2015-12-13 DIAGNOSIS — M79604 Pain in right leg: Secondary | ICD-10-CM

## 2015-12-13 NOTE — Telephone Encounter (Signed)
Pt contacted office for refill request on the following medications:  oxyCODONE (OXY IR/ROXICODONE) 5 MG immediate release tablet.  ES#[phone]/QJ

## 2015-12-14 MED ORDER — OXYCODONE HCL 5 MG PO TABS: 10.0000 mg | ORAL_TABLET | Freq: Four times a day (QID) | ORAL | 0 refills | Status: DC | PRN

## 2015-12-22 ENCOUNTER — Encounter
Admission: RE | Admit: 2015-12-22 | Discharge: 2015-12-22 | Disposition: A | Discharge status: Home / Self Care | Payer: Medicare Other | Source: Ambulatory Visit | Attending: Nephrology | Admitting: Nephrology

## 2015-12-22 DIAGNOSIS — E0590 Thyrotoxicosis, unspecified without thyrotoxic crisis or storm: Secondary | ICD-10-CM

## 2015-12-22 MED ORDER — TECHNETIUM TC 99M SESTAMIBI - CARDIOLITE
26.2700 | Freq: Once | INTRAVENOUS | Status: AC | PRN
  Administered 2015-12-22: 26.27 via INTRAVENOUS

## 2015-12-26 ENCOUNTER — Other Ambulatory Visit: Payer: Self-pay | Admitting: Family Medicine

## 2015-12-26 DIAGNOSIS — L905 Scar conditions and fibrosis of skin: Secondary | ICD-10-CM

## 2015-12-26 DIAGNOSIS — M79604 Pain in right leg: Secondary | ICD-10-CM

## 2015-12-26 DIAGNOSIS — R52 Pain, unspecified: Secondary | ICD-10-CM

## 2015-12-26 NOTE — Telephone Encounter (Signed)
Pt contacted office for refill request on the following medications: oxyCODONE (OXY IR/ROXICODONE) 5 MG immediate release tablet Last written: 12/14/15 Last OV: 11/07/15 No Follow ups scheduled. Please advise. Thanks TNP

## 2015-12-28 ENCOUNTER — Telehealth: Payer: Self-pay | Admitting: Family Medicine

## 2015-12-28 MED ORDER — OXYCODONE HCL 5 MG PO TABS: 10.0000 mg | ORAL_TABLET | Freq: Four times a day (QID) | ORAL | 0 refills | Status: DC | PRN

## 2015-12-29 ENCOUNTER — Ambulatory Visit: Payer: Medicare Other

## 2015-12-29 DIAGNOSIS — I6523 Occlusion and stenosis of bilateral carotid arteries: Secondary | ICD-10-CM | POA: Diagnosis not present

## 2015-12-29 DIAGNOSIS — I6529 Occlusion and stenosis of unspecified carotid artery: Secondary | ICD-10-CM

## 2016-01-09 ENCOUNTER — Other Ambulatory Visit: Payer: Self-pay | Admitting: Family Medicine

## 2016-01-09 DIAGNOSIS — L905 Scar conditions and fibrosis of skin: Secondary | ICD-10-CM

## 2016-01-09 DIAGNOSIS — R52 Pain, unspecified: Secondary | ICD-10-CM

## 2016-01-09 DIAGNOSIS — M79604 Pain in right leg: Secondary | ICD-10-CM

## 2016-01-09 NOTE — Telephone Encounter (Signed)
.  Pt contacted office for refill request on the following medications:  oxyCODONE (OXY IR/ROXICODONE) 5 MG immediate release tablet.  TG#[phone]/GS

## 2016-01-11 MED ORDER — OXYCODONE HCL 5 MG PO TABS: 10.0000 mg | ORAL_TABLET | Freq: Four times a day (QID) | ORAL | 0 refills | Status: DC | PRN

## 2016-01-11 NOTE — Telephone Encounter (Signed)
Pt states he needs to pick this Rx up today.  BP#[phone]/FQ

## 2016-01-20 ENCOUNTER — Other Ambulatory Visit: Payer: Self-pay | Admitting: Family Medicine

## 2016-01-20 DIAGNOSIS — R52 Pain, unspecified: Secondary | ICD-10-CM

## 2016-01-20 DIAGNOSIS — L905 Scar conditions and fibrosis of skin: Secondary | ICD-10-CM

## 2016-01-20 DIAGNOSIS — M79604 Pain in right leg: Secondary | ICD-10-CM

## 2016-01-20 NOTE — Telephone Encounter (Signed)
Pt contacted office for refill request on the following medications:  oxyCODONE (OXY IR/ROXICODONE) 5 MG immediate release tablet.  PJ#[phone]/QH

## 2016-01-20 NOTE — Telephone Encounter (Signed)
Please review. Thanks!  

## 2016-01-24 MED ORDER — OXYCODONE HCL 5 MG PO TABS: 10.0000 mg | ORAL_TABLET | Freq: Four times a day (QID) | ORAL | 0 refills | Status: DC | PRN

## 2016-02-02 ENCOUNTER — Other Ambulatory Visit: Payer: Self-pay | Admitting: Family Medicine

## 2016-02-02 DIAGNOSIS — L905 Scar conditions and fibrosis of skin: Secondary | ICD-10-CM

## 2016-02-02 DIAGNOSIS — M79604 Pain in right leg: Secondary | ICD-10-CM

## 2016-02-02 DIAGNOSIS — R52 Pain, unspecified: Secondary | ICD-10-CM

## 2016-02-02 NOTE — Telephone Encounter (Signed)
Pt contacted office for refill request on the following medications:  oxyCODONE (OXY IR/ROXICODONE) 5 MG immediate release tablet.  CZ#[id number]/IY

## 2016-02-02 NOTE — Telephone Encounter (Deleted)
Pt contacted office for refill request on the following medications:   oxyCODONE (OXY IR/ROXICODONE) 5 MG immediate release tablet   01/24/16 -- Birdie Sons, MD    Take 2 tablets (10 mg total) by mouth every 6 (six) hours as needed for

## 2016-02-03 MED ORDER — OXYCODONE HCL 5 MG PO TABS: 10.0000 mg | ORAL_TABLET | Freq: Four times a day (QID) | ORAL | 0 refills | Status: DC | PRN

## 2016-02-13 ENCOUNTER — Telehealth: Payer: Self-pay | Admitting: Family Medicine

## 2016-02-13 DIAGNOSIS — L905 Scar conditions and fibrosis of skin: Secondary | ICD-10-CM

## 2016-02-13 DIAGNOSIS — M79604 Pain in right leg: Secondary | ICD-10-CM

## 2016-02-13 DIAGNOSIS — R52 Pain, unspecified: Secondary | ICD-10-CM

## 2016-02-13 NOTE — Telephone Encounter (Signed)
Pt contacted office for refill request on the following medications: oxyCODONE (OXY IR/ROXICODONE) 5 MG immediate release tablet Last Written: 02/03/16 Pt stated that he would be out near the end of the week but because of the holiday and our office  closing early on Friday he would like to pick up the Rx Wednesday 02/15/16. Please advise. Thanks TNP

## 2016-02-13 NOTE — Telephone Encounter (Signed)
Please review. KW 

## 2016-02-15 ENCOUNTER — Telehealth: Payer: Self-pay

## 2016-02-15 ENCOUNTER — Other Ambulatory Visit: Payer: Self-pay | Admitting: Family Medicine

## 2016-02-15 ENCOUNTER — Other Ambulatory Visit: Payer: Self-pay

## 2016-02-15 MED ORDER — ALPRAZOLAM 2 MG PO TABS: ORAL_TABLET | ORAL | 5 refills | Status: DC

## 2016-02-15 MED ORDER — OXYCODONE HCL 5 MG PO TABS: 10.0000 mg | ORAL_TABLET | Freq: Four times a day (QID) | ORAL | 0 refills | Status: DC | PRN

## 2016-02-15 NOTE — Telephone Encounter (Signed)
Please review-aa 

## 2016-02-15 NOTE — Telephone Encounter (Signed)
James Moreno with pharmacy stated that she was calling to check the status of the refill request they had sent for alprazolam James Moreno) 2 MG tablet. I advised I didn't see that we had received the request but would send back a refill request.  Last Rx: 08/26/15 with 5 refills Please advise. Thanks TNP

## 2016-02-15 NOTE — Telephone Encounter (Signed)
Please call in alprazolam.  

## 2016-02-15 NOTE — Telephone Encounter (Signed)
Prescription has been called in.KW

## 2016-02-15 NOTE — Telephone Encounter (Signed)
Gastroenterology Pre-Procedure Review  Request Date: 03/08/2016 Requesting Physician: Dr. Caryn Section  PATIENT REVIEW QUESTIONS: The patient responded to the following health history questions as indicated:    1. Are you having any GI issues? no 2. Do you have a personal history of Polyps? no 3. Do you have a family history of Colon Cancer or Polyps? no 4. Diabetes Mellitus? yes (Type 2) 5. Joint replacements in the past 12 months?no 6. Major health problems in the past 3 months?no 7. Any artificial heart valves, MVP, or defibrillator?no    MEDICATIONS & ALLERGIES:    Patient reports the following regarding taking any anticoagulation/antiplatelet therapy:   Plavix, Coumadin, Eliquis, Xarelto, Lovenox, Pradaxa, Brilinta, or Effient? no Aspirin? yes (Heart Health )  Patient confirms/reports the following medications:  Current Outpatient Prescriptions  Medication Sig Dispense Refill  . alprazolam (XANAX) 2 MG tablet Take 1/2 to 1 Tablet 3 Times Daily As Needed 90 tablet 5  . Blood Glucose Monitoring Suppl (GLUCOCOM BLOOD GLUCOSE MONITOR) DEVI Frequency:ONCE   Dosage:0.0     Instructions:  Note:Dose: N/A    . carvedilol (COREG) 6.25 MG tablet TAKE ONE TABLET BY MOUTH 2 TIMES A DAY WITH A MEAL 180 tablet 3  . cinacalcet (SENSIPAR) 30 MG tablet Take 30 mg by mouth daily.    . insulin aspart (NOVOLOG) 100 UNIT/ML injection Inject 10 Units into the skin 3 (three) times daily before meals. Sliding scale    . Insulin Glargine (TOUJEO SOLOSTAR [place]) Inject 50 Units into the skin daily.     Marland Kitchen lovastatin (MEVACOR) 40 MG tablet Take 40 mg by mouth at bedtime.    . mycophenolate (MYFORTIC) 180 MG EC tablet Take 180 mg by mouth 2 (two) times daily.    Marland Kitchen omeprazole (PRILOSEC) 20 MG capsule Take 20 mg by mouth daily.    . sildenafil (VIAGRA) 100 MG tablet Take 0.5-1 tablets (50-100 mg total) by mouth daily as needed for erectile dysfunction. 30 tablet 1  . tacrolimus (PROGRAF) 1 MG capsule Takes 5 tablets  every morning and 4 tablets every evening  11  . losartan (COZAAR) 50 MG tablet   11  . oxyCODONE (OXY IR/ROXICODONE) 5 MG immediate release tablet Take 2 tablets (10 mg total) by mouth every 6 (six) hours as needed for severe pain. (Patient not taking: Reported on 02/15/2016) 120 tablet 0   No current facility-administered medications for this visit.     Patient confirms/reports the following allergies:  No Known Allergies  No orders of the defined types were placed in this encounter.   AUTHORIZATION INFORMATION Primary Insurance: 1D#: Group #:  Secondary Insurance: 1D#: Group #:  SCHEDULE INFORMATION: Date: 03/08/2016 Time: Location: [facility]

## 2016-02-15 NOTE — Telephone Encounter (Signed)
Screening Colonoscopy Z12.11 [facility] 03/08/2016 Dr. Vicente Males Medicare/U[employer] Pre cert

## 2016-02-21 ENCOUNTER — Telehealth: Payer: Self-pay | Admitting: Family Medicine

## 2016-02-21 NOTE — Telephone Encounter (Signed)
Needs insurance referral for screening colonoscopy.

## 2016-02-21 NOTE — Telephone Encounter (Signed)
Pt has colonoscopy schd. In Dec with Dr. Vicente Males at [place].    He will need a referrral.  He has Medicare and U[employer].  Call back is [phone]  [facility]

## 2016-02-21 NOTE — Telephone Encounter (Signed)
No referral needed to specialist per Daleen Bo at [facility].Referance # [id number].Pt advised that colonoscopy itself may need prior approval and Dr Georgeann Oppenheim office would need to get approval since they are preforming procedure

## 2016-02-21 NOTE — Telephone Encounter (Signed)
Please advise 

## 2016-02-27 ENCOUNTER — Other Ambulatory Visit: Payer: Self-pay | Admitting: Family Medicine

## 2016-02-27 DIAGNOSIS — R52 Pain, unspecified: Secondary | ICD-10-CM

## 2016-02-27 DIAGNOSIS — M79604 Pain in right leg: Secondary | ICD-10-CM

## 2016-02-27 DIAGNOSIS — L905 Scar conditions and fibrosis of skin: Secondary | ICD-10-CM

## 2016-02-27 NOTE — Telephone Encounter (Signed)
Pt needs refill on his pain med  oxyCODONE (OXY IR/ROXICODONE) 5 MG immediate release tablet  Not Taking 02/15/16 -- Birdie Sons, MD    Take 2 tablets (10 mg total) by mouth every 6 (six) hours as needed for      Thank sTeri

## 2016-02-28 NOTE — Telephone Encounter (Signed)
rf due 03-01-16

## 2016-02-28 NOTE — Telephone Encounter (Signed)
The notification/prior authorization case information was transmitted on 02/28/2016 at 2:28 PM CST. The notification/prior authorization reference number is [id number]. Please print this page for your records.

## 2016-03-01 MED ORDER — OXYCODONE HCL 5 MG PO TABS: 10.0000 mg | ORAL_TABLET | Freq: Four times a day (QID) | ORAL | 0 refills | Status: DC | PRN

## 2016-03-06 ENCOUNTER — Telehealth: Payer: Self-pay | Admitting: Family Medicine

## 2016-03-06 DIAGNOSIS — L905 Scar conditions and fibrosis of skin: Secondary | ICD-10-CM

## 2016-03-06 DIAGNOSIS — R52 Pain, unspecified: Secondary | ICD-10-CM

## 2016-03-06 DIAGNOSIS — M79604 Pain in right leg: Secondary | ICD-10-CM

## 2016-03-06 NOTE — Telephone Encounter (Signed)
Pt contacted office for refill request on the following medications:  oxyCODONE (OXY IR/ROXICODONE) 5 MG immediate release tablet.  VC#[phone]/BW  Pt is requesting to pick this up by this Friday due to going out of town on Saturday for 7 days/MW

## 2016-03-08 ENCOUNTER — Ambulatory Visit: Admission: RE | Admit: 2016-03-08 | Payer: Medicare Other | Source: Ambulatory Visit | Admitting: Gastroenterology

## 2016-03-08 ENCOUNTER — Encounter: Admission: RE | Payer: Self-pay | Source: Ambulatory Visit

## 2016-03-08 SURGERY — COLONOSCOPY WITH PROPOFOL
Anesthesia: General

## 2016-03-09 MED ORDER — OXYCODONE HCL 5 MG PO TABS: 5.0000 mg | ORAL_TABLET | Freq: Four times a day (QID) | ORAL | 0 refills | Status: DC | PRN

## 2016-03-09 NOTE — Telephone Encounter (Signed)
Patient called again requesting a refill on medication listed below. Patient reports that he will need this today before he leaves out of town. Please call when ready. Contact number is correct. Thanks!

## 2016-03-09 NOTE — Telephone Encounter (Signed)
done

## 2016-03-27 ENCOUNTER — Telehealth: Payer: Self-pay | Admitting: Family Medicine

## 2016-03-27 DIAGNOSIS — M79604 Pain in right leg: Secondary | ICD-10-CM

## 2016-03-27 DIAGNOSIS — L905 Scar conditions and fibrosis of skin: Secondary | ICD-10-CM

## 2016-03-27 DIAGNOSIS — R52 Pain, unspecified: Secondary | ICD-10-CM

## 2016-03-27 MED ORDER — OXYCODONE HCL 5 MG PO TABS: 5.0000 mg | ORAL_TABLET | Freq: Four times a day (QID) | ORAL | 0 refills | Status: DC | PRN

## 2016-03-27 NOTE — Telephone Encounter (Signed)
Pt contacted office for refill request on the following medications: oxyCODONE (OXY IR/ROXICODONE) 5 MG immediate release tablet  Last Rx: 03/09/16 Last OV: 11/07/15 Pt was advised that Dr. Caryn Section is out of the office today. Pt stated he has enough medication to last through tomorrow. Please advise. Thanks TNP

## 2016-03-27 NOTE — Telephone Encounter (Signed)
Patient advised as below. Patient verbalizes understanding and is in agreement with treatment plan.  

## 2016-03-27 NOTE — Telephone Encounter (Signed)
I will give him a week and let Dr. Caryn Section re-evaluate. Not sure if he needs to do other things for refills such as urine screen, etc.

## 2016-03-27 NOTE — Telephone Encounter (Signed)
Last ov 11/07/15 last filled 03/09/16. Please review. Thank you.

## 2016-03-28 ENCOUNTER — Ambulatory Visit (INDEPENDENT_AMBULATORY_CARE_PROVIDER_SITE_OTHER): Payer: Medicare Other | Admitting: Family Medicine

## 2016-03-28 ENCOUNTER — Encounter: Payer: Self-pay | Admitting: Family Medicine

## 2016-03-28 VITALS — BP 130/70 | HR 106 | Temp 98.3°F | Resp 16 | Wt 229.0 lb

## 2016-03-28 DIAGNOSIS — R52 Pain, unspecified: Secondary | ICD-10-CM

## 2016-03-28 DIAGNOSIS — F17200 Nicotine dependence, unspecified, uncomplicated: Secondary | ICD-10-CM

## 2016-03-28 DIAGNOSIS — I151 Hypertension secondary to other renal disorders: Secondary | ICD-10-CM

## 2016-03-28 DIAGNOSIS — Z940 Kidney transplant status: Secondary | ICD-10-CM | POA: Diagnosis not present

## 2016-03-28 DIAGNOSIS — N2889 Other specified disorders of kidney and ureter: Secondary | ICD-10-CM

## 2016-03-28 DIAGNOSIS — E785 Hyperlipidemia, unspecified: Secondary | ICD-10-CM

## 2016-03-28 DIAGNOSIS — I779 Disorder of arteries and arterioles, unspecified: Secondary | ICD-10-CM | POA: Diagnosis not present

## 2016-03-28 DIAGNOSIS — L905 Scar conditions and fibrosis of skin: Secondary | ICD-10-CM

## 2016-03-28 DIAGNOSIS — M79604 Pain in right leg: Secondary | ICD-10-CM | POA: Diagnosis not present

## 2016-03-28 DIAGNOSIS — Z0283 Encounter for blood-alcohol and blood-drug test: Secondary | ICD-10-CM | POA: Diagnosis not present

## 2016-03-28 DIAGNOSIS — F419 Anxiety disorder, unspecified: Secondary | ICD-10-CM

## 2016-03-28 DIAGNOSIS — F1190 Opioid use, unspecified, uncomplicated: Secondary | ICD-10-CM | POA: Diagnosis not present

## 2016-03-28 DIAGNOSIS — I739 Peripheral vascular disease, unspecified: Secondary | ICD-10-CM

## 2016-03-28 MED ORDER — OXYCODONE HCL 5 MG PO TABS: 5.0000 mg | ORAL_TABLET | Freq: Four times a day (QID) | ORAL | 0 refills | Status: DC | PRN

## 2016-03-28 NOTE — Patient Instructions (Signed)
Please stop smoking 

## 2016-03-28 NOTE — Progress Notes (Signed)
Patient: James Moreno Male    DOB: 05-Jun-1965   51 y.o.   MRN: [id number] Visit Date: 03/28/2016  Today's Provider: Lelon Huh, MD   Chief Complaint  Patient presents with  . Follow-up  . Hypertension  . Hyperlipidemia  . Pain   Subjective:    HPI   Chronic pain  Continues to have pain every day usually ingrown area since renal transplant surgery He takes 2 x 5mg  oxycodone every morning, then one take 1-2 about every four hours during the day. Stays he tries to only take one when he can, and usually averages eight a day. States he still has some manageable pain on this regiment, but maintaining good quality of life. Denies any adverse effects from medication.    Anxiety He states he takes a 2mg  alprazolam every morning which he finds effective. Then takes eight 1/2 tablet or a full tablet mid day and in the afternoon.     Hypertension, follow-up:  BP Readings from Last 3 Encounters:  03/28/16 130/70  11/08/15 130/72  11/07/15 140/78    He was last seen for hypertension 5 months ago.  BP at that visit was 140/78. Management since that visit includes; no changes.He reports good compliance with treatment. He is not having side effects. none He is exercising. He is adherent to low salt diet.   Outside blood pressures are 130-140/80/90. He is experiencing none.  Patient denies none.   Cardiovascular risk factors include diabetes mellitus.  Use of agents associated with hypertension: none.   ----------------------------------------------------------------    Lipid/Cholesterol, Follow-up:   Last seen for this 5 months ago.  Management since that visit includes; labs were ordered, but were not done.  Last Lipid Panel:    Component Value Date/Time   CHOL 205 (A) 04/21/2010   TRIG 255 (A) 04/21/2010   HDL 43 04/21/2010   LDLCALC 111 04/21/2010    He reports good compliance with treatment. He is not having side effects. none  Wt Readings from  Last 3 Encounters:  03/28/16 229 lb (103.9 kg)  11/08/15 216 lb (98 kg)  11/07/15 217 lb (98.4 kg)   Continue to see Dr.Gollan who is keeping check on carotid arteries.  ----------------------------------------------------------------    No Known Allergies   Current Outpatient Prescriptions:  .  alprazolam (XANAX) 2 MG tablet, Take 1/2 to 1 Tablet 3 Times Daily As Needed, Disp: 90 tablet, Rfl: 5 .  Blood Glucose Monitoring Suppl (GLUCOCOM BLOOD GLUCOSE MONITOR) DEVI, Frequency:ONCE   Dosage:0.0     Instructions:  Note:Dose: N/A, Disp: , Rfl:  .  carvedilol (COREG) 6.25 MG tablet, TAKE ONE TABLET BY MOUTH 2 TIMES A DAY WITH A MEAL, Disp: 180 tablet, Rfl: 3 .  cinacalcet (SENSIPAR) 30 MG tablet, Take 30 mg by mouth daily., Disp: , Rfl:  .  insulin aspart (NOVOLOG) 100 UNIT/ML injection, Inject 10 Units into the skin 3 (three) times daily before meals. Sliding scale, Disp: , Rfl:  .  Insulin Glargine (TOUJEO SOLOSTAR [place]), Inject 50 Units into the skin daily. , Disp: , Rfl:  .  losartan (COZAAR) 50 MG tablet, , Disp: , Rfl: 11 .  lovastatin (MEVACOR) 40 MG tablet, Take 40 mg by mouth at bedtime., Disp: , Rfl:  .  mycophenolate (MYFORTIC) 180 MG EC tablet, Take 180 mg by mouth 2 (two) times daily., Disp: , Rfl:  .  omeprazole (PRILOSEC) 20 MG capsule, Take 20 mg by mouth daily., Disp: , Rfl:  .  oxyCODONE (OXY IR/ROXICODONE) 5 MG immediate release tablet, Take 1-2 tablets (5-10 mg total) by mouth every 6 (six) hours as needed for severe pain., Disp: 56 tablet, Rfl: 0 .  sildenafil (VIAGRA) 100 MG tablet, Take 0.5-1 tablets (50-100 mg total) by mouth daily as needed for erectile dysfunction., Disp: 30 tablet, Rfl: 1 .  tacrolimus (PROGRAF) 1 MG capsule, Takes 5 tablets every morning and 4 tablets every evening, Disp: , Rfl: 11  Review of Systems  Constitutional: Negative for appetite change, chills and fever.  Respiratory: Negative for chest tightness, shortness of breath and wheezing.     Cardiovascular: Negative for chest pain and palpitations.  Gastrointestinal: Negative for abdominal pain, nausea and vomiting.    Social History  Substance Use Topics  . Smoking status: Current Some Day Smoker    Packs/day: 0.50    Years: 31.00    Types: Cigarettes  . Smokeless tobacco: Former Systems developer    Quit date: 11/26/2011     Comment: Quit 3 months ago  . Alcohol use No     Comment: Excessive alcohol consumption in the past    Objective:   BP 130/70 (BP Location: Right Arm, Patient Position: Sitting, Cuff Size: Normal)   Pulse (!) 106   Temp 98.3 F (36.8 C) (Oral)   Resp 16   Wt 229 lb (103.9 kg)   SpO2 97%   BMI 31.94 kg/m   Physical Exam   General Appearance:    Alert, cooperative, no distress  Eyes:    PERRL, conjunctiva/corneas clear, EOM's intact       Lungs:     Clear to auscultation bilaterally, respirations unlabored  Heart:    Regular rate and rhythm  Neurologic:   Awake, alert, oriented x 3. No apparent focal neurological           defect.           Assessment & Plan:     1. Pain in surgical scar Pain adequately controlled on current medication regiment.  - oxyCODONE (OXY IR/ROXICODONE) 5 MG immediate release tablet; Take 1-2 tablets (5-10 mg total) by mouth every 6 (six) hours as needed for severe pain.  Dispense: 240 tablet; Refill: 0  2. Hypertension secondary to other renal disorders Well controlled.  Continue current medications.    3. Bilateral carotid artery disease (HCC) Aggressive cholesterol management  4. Hyperlipidemia, unspecified hyperlipidemia type He is tolerating lovastatin well with no adverse effects.   - Lipid panel  5. Pain of right lower extremity  - oxyCODONE (OXY IR/ROXICODONE) 5 MG immediate release tablet; Take 1-2 tablets (5-10 mg total) by mouth every 6 (six) hours as needed for severe pain.  Dispense: 240 tablet; Refill: 0  6. Encounter for drug screening  - Pain [facility] (14 Drugs), Ur  7. Chronic, continuous use  of opioids   8. Smoker Still smoking 5-7 cigs a day and vaping. Advised to stop smoking completely.   9. Anxiety He feels this is improving, but he anticipates completing second associates degree at [employer] later this year and anticipates more stress if he is able to start looking for job. Advised to work on reducing dose of alprazolam for the time being.   Follow up 6 months.      The entirety of the information documented in the History of Present Illness, Review of Systems and Physical Exam were personally obtained by me. Portions of this information were initially documented by Theressa Millard, CMA and reviewed by me for thoroughness  and accuracy.    Lelon Huh, MD  [facility]

## 2016-03-29 LAB — PAIN MGT SCRN (14 DRUGS), UR
Amphetamine Screen, Ur: NEGATIVE ng/mL
Barbiturate Screen, Ur: NEGATIVE ng/mL
Benzodiazepine Screen, Urine: NEGATIVE ng/mL
Buprenorphine, Urine: NEGATIVE ng/mL
Cannabinoids Ur Ql Scn: NEGATIVE ng/mL
Cocaine(Metab.)Screen, Urine: NEGATIVE ng/mL
Creatinine(Crt), U: 230.1 mg/dL (ref 20.0–300.0)
Fentanyl, Urine: NEGATIVE pg/mL
Meperidine Screen, Urine: NEGATIVE ng/mL
Methadone Scn, Ur: NEGATIVE ng/mL
Opiate Scrn, Ur: POSITIVE ng/mL
Oxycodone+Oxymorphone Ur Ql Scn: POSITIVE ng/mL
PCP Scrn, Ur: NEGATIVE ng/mL
Ph of Urine: 4.9 (ref 4.5–8.9)
Propoxyphene, Screen: NEGATIVE ng/mL
Tramadol Ur Ql Scn: NEGATIVE ng/mL

## 2016-03-29 LAB — LIPID PANEL
Chol/HDL Ratio: 4.6 ratio units (ref 0.0–5.0)
Cholesterol, Total: 172 mg/dL (ref 100–199)
HDL: 37 mg/dL — ABNORMAL LOW (ref >39)
LDL Calculated: 64 mg/dL (ref 0–99)
Triglycerides: 357 mg/dL — ABNORMAL HIGH (ref 0–149)
VLDL Cholesterol Cal: 71 mg/dL — ABNORMAL HIGH (ref 5–40)

## 2016-04-17 DIAGNOSIS — E113592 Type 2 diabetes mellitus with proliferative diabetic retinopathy without macular edema, left eye: Secondary | ICD-10-CM | POA: Diagnosis not present

## 2016-04-19 ENCOUNTER — Ambulatory Visit: Payer: Medicare Other | Admitting: Family Medicine

## 2016-04-19 ENCOUNTER — Other Ambulatory Visit: Payer: Self-pay | Admitting: Family Medicine

## 2016-04-19 DIAGNOSIS — R52 Pain, unspecified: Secondary | ICD-10-CM

## 2016-04-19 DIAGNOSIS — L905 Scar conditions and fibrosis of skin: Secondary | ICD-10-CM

## 2016-04-19 DIAGNOSIS — M79604 Pain in right leg: Secondary | ICD-10-CM

## 2016-04-19 NOTE — Telephone Encounter (Signed)
Pt contacted office for refill request on the following medications: oxyCODONE (OXY IR/ROXICODONE) 5 MG immediate release tablet  Last Rx: 03/28/16 Please advise. Thanks TNP

## 2016-04-26 MED ORDER — OXYCODONE HCL 5 MG PO TABS: 5.0000 mg | ORAL_TABLET | Freq: Four times a day (QID) | ORAL | 0 refills | Status: DC | PRN

## 2016-05-07 DIAGNOSIS — E0859 Diabetes mellitus due to underlying condition with other circulatory complications: Secondary | ICD-10-CM | POA: Diagnosis not present

## 2016-05-07 DIAGNOSIS — E08311 Diabetes mellitus due to underlying condition with unspecified diabetic retinopathy with macular edema: Secondary | ICD-10-CM | POA: Diagnosis not present

## 2016-05-07 DIAGNOSIS — N189 Chronic kidney disease, unspecified: Secondary | ICD-10-CM | POA: Diagnosis not present

## 2016-05-07 DIAGNOSIS — I10 Essential (primary) hypertension: Secondary | ICD-10-CM | POA: Diagnosis not present

## 2016-05-07 DIAGNOSIS — E785 Hyperlipidemia, unspecified: Secondary | ICD-10-CM | POA: Diagnosis not present

## 2016-05-07 DIAGNOSIS — E118 Type 2 diabetes mellitus with unspecified complications: Secondary | ICD-10-CM | POA: Diagnosis not present

## 2016-05-07 DIAGNOSIS — E1121 Type 2 diabetes mellitus with diabetic nephropathy: Secondary | ICD-10-CM | POA: Diagnosis not present

## 2016-05-07 DIAGNOSIS — E1165 Type 2 diabetes mellitus with hyperglycemia: Secondary | ICD-10-CM | POA: Diagnosis not present

## 2016-05-07 DIAGNOSIS — N2581 Secondary hyperparathyroidism of renal origin: Secondary | ICD-10-CM | POA: Diagnosis not present

## 2016-05-07 DIAGNOSIS — I7389 Other specified peripheral vascular diseases: Secondary | ICD-10-CM | POA: Diagnosis not present

## 2016-05-22 ENCOUNTER — Other Ambulatory Visit: Payer: Self-pay | Admitting: Family Medicine

## 2016-05-22 DIAGNOSIS — M79604 Pain in right leg: Secondary | ICD-10-CM

## 2016-05-22 DIAGNOSIS — R52 Pain, unspecified: Secondary | ICD-10-CM

## 2016-05-22 DIAGNOSIS — L905 Scar conditions and fibrosis of skin: Secondary | ICD-10-CM

## 2016-05-22 NOTE — Telephone Encounter (Signed)
Needs refill on his   oxyCODONE (OXY IR/ROXICODONE) 5 MG immediate release tablet  Thank sTeri

## 2016-05-23 ENCOUNTER — Telehealth: Payer: Self-pay | Admitting: Family Medicine

## 2016-05-23 NOTE — Telephone Encounter (Signed)
Not taking new patients at this time.

## 2016-05-23 NOTE — Telephone Encounter (Signed)
Called patient back and before I was able to tell him the message below he advised me of the reasons he and his girlfriend want to switch. Discussed with Dr Caryn Section and he wanted to get name and DOB and will answer after that. Girlfriend's name is Burnett Harry DOB: 12/02/62 and [employer] and medicaid. Please review. Thank you-aa

## 2016-05-23 NOTE — Telephone Encounter (Signed)
Not accepting new patients at this time.

## 2016-05-23 NOTE — Telephone Encounter (Signed)
Johsua wants to know if you will take his girlfriend as a patient.  They are unhappy with the service Dr. Manuella Ghazi at [facility] gives them.

## 2016-05-24 MED ORDER — OXYCODONE HCL 5 MG PO TABS: 5.0000 mg | ORAL_TABLET | Freq: Four times a day (QID) | ORAL | 0 refills | Status: DC | PRN

## 2016-05-24 NOTE — Telephone Encounter (Signed)
Patient advised  ED 

## 2016-06-15 ENCOUNTER — Other Ambulatory Visit: Payer: Self-pay | Admitting: Family Medicine

## 2016-06-15 DIAGNOSIS — M79604 Pain in right leg: Secondary | ICD-10-CM

## 2016-06-15 DIAGNOSIS — R52 Pain, unspecified: Secondary | ICD-10-CM

## 2016-06-15 DIAGNOSIS — L905 Scar conditions and fibrosis of skin: Secondary | ICD-10-CM

## 2016-06-15 NOTE — Telephone Encounter (Signed)
Pt contacted office for refill request on the following medications:  oxyCODONE (OXY IR/ROXICODONE) 5 MG immediate release tablet.  [phone]/OH

## 2016-06-20 MED ORDER — OXYCODONE HCL 5 MG PO TABS: 5.0000 mg | ORAL_TABLET | Freq: Four times a day (QID) | ORAL | 0 refills | Status: DC | PRN

## 2016-07-13 ENCOUNTER — Other Ambulatory Visit: Payer: Self-pay | Admitting: Family Medicine

## 2016-07-13 DIAGNOSIS — M79604 Pain in right leg: Secondary | ICD-10-CM

## 2016-07-13 DIAGNOSIS — L905 Scar conditions and fibrosis of skin: Secondary | ICD-10-CM

## 2016-07-13 DIAGNOSIS — R52 Pain, unspecified: Secondary | ICD-10-CM

## 2016-07-13 NOTE — Telephone Encounter (Signed)
Pt contacted office for refill request on the following medications:  oxyCODONE (OXY IR/ROXICODONE) 5 MG immediate release tablet.  OY#[phone]/EH

## 2016-07-13 NOTE — Telephone Encounter (Signed)
LAST FILL WAS 06/20/16-aa

## 2016-07-19 NOTE — Telephone Encounter (Signed)
Pt called back for status on his refill.  He would like to pick up rx Friday.  He will run out Sunday,  Thanks [employer]

## 2016-07-19 NOTE — Telephone Encounter (Signed)
please review

## 2016-07-20 MED ORDER — OXYCODONE HCL 5 MG PO TABS: 5.0000 mg | ORAL_TABLET | Freq: Four times a day (QID) | ORAL | 0 refills | Status: DC | PRN

## 2016-08-01 ENCOUNTER — Other Ambulatory Visit: Payer: Self-pay | Admitting: Cardiovascular Disease

## 2016-08-06 DIAGNOSIS — N189 Chronic kidney disease, unspecified: Secondary | ICD-10-CM | POA: Diagnosis not present

## 2016-08-06 DIAGNOSIS — E1165 Type 2 diabetes mellitus with hyperglycemia: Secondary | ICD-10-CM | POA: Diagnosis not present

## 2016-08-06 DIAGNOSIS — I10 Essential (primary) hypertension: Secondary | ICD-10-CM | POA: Diagnosis not present

## 2016-08-06 DIAGNOSIS — E785 Hyperlipidemia, unspecified: Secondary | ICD-10-CM | POA: Diagnosis not present

## 2016-08-07 ENCOUNTER — Other Ambulatory Visit: Payer: Self-pay | Admitting: Cardiovascular Disease

## 2016-08-07 ENCOUNTER — Other Ambulatory Visit: Payer: Self-pay | Admitting: Family Medicine

## 2016-08-07 MED ORDER — CARVEDILOL 6.25 MG PO TABS: ORAL_TABLET | ORAL | 0 refills | Status: DC

## 2016-08-07 NOTE — Telephone Encounter (Signed)
[facility] faxed a request for a 90-days supply for the following medication.  Thanks CC  alprazolam (XANAX) 2 MG tablet

## 2016-08-07 NOTE — Telephone Encounter (Signed)
Last ov 03/28/16 Last filled 02/15/16

## 2016-08-08 NOTE — Telephone Encounter (Signed)
I rec'd a call from [place] at [place] requesting a Rx for alprazolam Duanne Moron) 2 MG tablet.  UX#[phone]/VF  You can ask for [facility] or [place].

## 2016-08-08 NOTE — Telephone Encounter (Signed)
Please call in alprazolam.  

## 2016-08-09 MED ORDER — ALPRAZOLAM 2 MG PO TABS: ORAL_TABLET | ORAL | 5 refills | Status: DC

## 2016-08-09 NOTE — Telephone Encounter (Signed)
Please call in alprazolam.  

## 2016-08-09 NOTE — Telephone Encounter (Signed)
Called in alprazolam via leaving message at [facility].

## 2016-08-13 ENCOUNTER — Ambulatory Visit: Payer: Medicare Other | Admitting: Cardiovascular Disease

## 2016-08-14 ENCOUNTER — Other Ambulatory Visit: Payer: Self-pay

## 2016-08-14 DIAGNOSIS — F419 Anxiety disorder, unspecified: Secondary | ICD-10-CM

## 2016-08-14 MED ORDER — ALPRAZOLAM 2 MG PO TABS: ORAL_TABLET | ORAL | 5 refills | Status: DC

## 2016-08-14 NOTE — Telephone Encounter (Signed)
[facility]ull [facility] called requesting refill for a 90 day supply on this medication. I advised her that this medication was approved  And phoned in on 08/09/2016. Sharyn Lull advised me that they cannot accept the prescription if it is called in. She request that it be faxed in or e scribed. I printed the prescription and faxed to the pharmacy at [phone].

## 2016-08-15 ENCOUNTER — Other Ambulatory Visit: Payer: Self-pay | Admitting: Family Medicine

## 2016-08-15 DIAGNOSIS — R52 Pain, unspecified: Secondary | ICD-10-CM

## 2016-08-15 DIAGNOSIS — M79604 Pain in right leg: Secondary | ICD-10-CM

## 2016-08-15 DIAGNOSIS — L905 Scar conditions and fibrosis of skin: Secondary | ICD-10-CM

## 2016-08-15 MED ORDER — OXYCODONE HCL 5 MG PO TABS: 5.0000 mg | ORAL_TABLET | Freq: Four times a day (QID) | ORAL | 0 refills | Status: DC | PRN

## 2016-08-15 NOTE — Telephone Encounter (Signed)
Pt contacted office for refill request on the following medications:  oxyCODONE (OXY IR/ROXICODONE) 5 MG immediate release tablet.  SB#[phone]/CO

## 2016-08-21 ENCOUNTER — Telehealth: Payer: Self-pay | Admitting: Cardiovascular Disease

## 2016-08-21 NOTE — Telephone Encounter (Signed)
Returned call to patient. Reviewed results and recommendations from past carotid dopplers. Last carotid doppler was 12/29/15: "Notes Recorded by Minna Merritts, MD on 01/01/2016 at 12:01 PM EDT Carotid disease <39% blockage b/l Recheck PRN"  Patient very thankful for the decrease in the blockages over the years since his kidney transplant. He is aware of upcoming appt with Dr Rockey Situ.

## 2016-08-21 NOTE — Telephone Encounter (Signed)
Pt asks if he needs to have a follow up carotid. Please call and advise.

## 2016-08-23 ENCOUNTER — Other Ambulatory Visit: Payer: Self-pay | Admitting: Family Medicine

## 2016-08-23 DIAGNOSIS — E1121 Type 2 diabetes mellitus with diabetic nephropathy: Secondary | ICD-10-CM | POA: Diagnosis not present

## 2016-08-23 DIAGNOSIS — N189 Chronic kidney disease, unspecified: Secondary | ICD-10-CM | POA: Diagnosis not present

## 2016-08-23 DIAGNOSIS — N2581 Secondary hyperparathyroidism of renal origin: Secondary | ICD-10-CM | POA: Diagnosis not present

## 2016-08-23 DIAGNOSIS — I10 Essential (primary) hypertension: Secondary | ICD-10-CM | POA: Diagnosis not present

## 2016-08-23 DIAGNOSIS — I7389 Other specified peripheral vascular diseases: Secondary | ICD-10-CM | POA: Diagnosis not present

## 2016-08-23 DIAGNOSIS — E785 Hyperlipidemia, unspecified: Secondary | ICD-10-CM | POA: Diagnosis not present

## 2016-08-23 DIAGNOSIS — E08311 Diabetes mellitus due to underlying condition with unspecified diabetic retinopathy with macular edema: Secondary | ICD-10-CM | POA: Diagnosis not present

## 2016-08-23 DIAGNOSIS — E1165 Type 2 diabetes mellitus with hyperglycemia: Secondary | ICD-10-CM | POA: Diagnosis not present

## 2016-08-23 DIAGNOSIS — E118 Type 2 diabetes mellitus with unspecified complications: Secondary | ICD-10-CM | POA: Diagnosis not present

## 2016-08-23 DIAGNOSIS — E0859 Diabetes mellitus due to underlying condition with other circulatory complications: Secondary | ICD-10-CM | POA: Diagnosis not present

## 2016-08-23 MED ORDER — SILDENAFIL CITRATE 100 MG PO TABS: 50.0000 mg | ORAL_TABLET | Freq: Every day | ORAL | 5 refills | Status: DC | PRN

## 2016-08-23 NOTE — Telephone Encounter (Addendum)
Pt contacted office for refill request on the following medications:  sildenafil (VIAGRA) 100 MG tablet.  [place] order.  XU#[phone]/EJ

## 2016-08-28 ENCOUNTER — Telehealth: Payer: Self-pay

## 2016-08-28 ENCOUNTER — Ambulatory Visit: Payer: Medicare Other | Admitting: Cardiovascular Disease

## 2016-08-28 DIAGNOSIS — X32XXXA Exposure to sunlight, initial encounter: Secondary | ICD-10-CM | POA: Diagnosis not present

## 2016-08-28 DIAGNOSIS — L570 Actinic keratosis: Secondary | ICD-10-CM | POA: Diagnosis not present

## 2016-08-28 NOTE — Telephone Encounter (Signed)
lmtcb-aa received a [facility] from [facility] after submitting PA for Sildenafil coverage and they state patient does not have active eligibility at [facility]. Need to check with patient if he is aware, double check his insurance and if he is aware of what mail order pharmacy his insurance is associated with-aa

## 2016-09-04 DIAGNOSIS — I10 Essential (primary) hypertension: Secondary | ICD-10-CM | POA: Diagnosis not present

## 2016-09-04 DIAGNOSIS — Z940 Kidney transplant status: Secondary | ICD-10-CM | POA: Diagnosis not present

## 2016-09-04 DIAGNOSIS — N2581 Secondary hyperparathyroidism of renal origin: Secondary | ICD-10-CM | POA: Diagnosis not present

## 2016-09-04 DIAGNOSIS — R809 Proteinuria, unspecified: Secondary | ICD-10-CM | POA: Diagnosis not present

## 2016-09-06 NOTE — Telephone Encounter (Signed)
Patient advised. Patient states that he uses [facility] and already has this medication.

## 2016-09-11 ENCOUNTER — Other Ambulatory Visit: Payer: Self-pay | Admitting: Family Medicine

## 2016-09-11 DIAGNOSIS — M79604 Pain in right leg: Secondary | ICD-10-CM

## 2016-09-11 DIAGNOSIS — L905 Scar conditions and fibrosis of skin: Secondary | ICD-10-CM

## 2016-09-11 DIAGNOSIS — R52 Pain, unspecified: Secondary | ICD-10-CM

## 2016-09-11 NOTE — Telephone Encounter (Signed)
Pt contacted office for refill request on the following medications:  oxyCODONE (OXY IR/ROXICODONE) 5 MG immediate release tablet.  [phone]/YQ

## 2016-09-12 ENCOUNTER — Other Ambulatory Visit: Payer: Self-pay | Admitting: Family Medicine

## 2016-09-12 DIAGNOSIS — L905 Scar conditions and fibrosis of skin: Secondary | ICD-10-CM

## 2016-09-12 DIAGNOSIS — M79604 Pain in right leg: Secondary | ICD-10-CM

## 2016-09-12 DIAGNOSIS — R52 Pain, unspecified: Secondary | ICD-10-CM

## 2016-09-12 MED ORDER — OXYCODONE HCL 5 MG PO TABS: 5.0000 mg | ORAL_TABLET | Freq: Four times a day (QID) | ORAL | 0 refills | Status: DC | PRN

## 2016-09-28 ENCOUNTER — Ambulatory Visit: Payer: Medicare Other | Admitting: Cardiovascular Disease

## 2016-10-08 ENCOUNTER — Other Ambulatory Visit: Payer: Self-pay | Admitting: Family Medicine

## 2016-10-08 DIAGNOSIS — L905 Scar conditions and fibrosis of skin: Secondary | ICD-10-CM

## 2016-10-08 DIAGNOSIS — M79604 Pain in right leg: Secondary | ICD-10-CM

## 2016-10-08 DIAGNOSIS — R52 Pain, unspecified: Secondary | ICD-10-CM

## 2016-10-08 NOTE — Telephone Encounter (Signed)
Pt contacted office for refill request on the following medications:   oxyCODONE (OXY IR/ROXICODONE) 5 MG immediate release tablet.  [phone]/CQ

## 2016-10-11 ENCOUNTER — Telehealth: Payer: Self-pay | Admitting: Family Medicine

## 2016-10-11 DIAGNOSIS — E785 Hyperlipidemia, unspecified: Secondary | ICD-10-CM

## 2016-10-11 MED ORDER — OXYCODONE HCL 5 MG PO TABS: 5.0000 mg | ORAL_TABLET | Freq: Four times a day (QID) | ORAL | 0 refills | Status: DC | PRN

## 2016-10-11 NOTE — Telephone Encounter (Signed)
Ok, please print and fax order for lipid panel for hyperlipidemia.

## 2016-10-11 NOTE — Telephone Encounter (Signed)
Patient states that he is having blood work done at his nephrologist tomorrow and would like to know if he could get a lab order for cholesterol to have done at the same time.

## 2016-10-11 NOTE — Telephone Encounter (Signed)
Please review. Thanks!  

## 2016-10-11 NOTE — Telephone Encounter (Signed)
Done. Lab slip printed and faxed to Dr. Elwyn Lade office.

## 2016-10-11 NOTE — Telephone Encounter (Signed)
Pt. Would like the order for his cholesterol testing to be faxed to Dr. Elwyn Lade office so when he is there tomorrow morning he can have this checked also.  Patient doesn't want to get stuck twice.

## 2016-10-12 DIAGNOSIS — E785 Hyperlipidemia, unspecified: Secondary | ICD-10-CM | POA: Diagnosis not present

## 2016-10-12 DIAGNOSIS — Z940 Kidney transplant status: Secondary | ICD-10-CM | POA: Diagnosis not present

## 2016-10-12 DIAGNOSIS — N186 End stage renal disease: Secondary | ICD-10-CM | POA: Diagnosis not present

## 2016-11-05 ENCOUNTER — Other Ambulatory Visit: Payer: Self-pay | Admitting: Family Medicine

## 2016-11-05 DIAGNOSIS — M79604 Pain in right leg: Secondary | ICD-10-CM

## 2016-11-05 DIAGNOSIS — L905 Scar conditions and fibrosis of skin: Secondary | ICD-10-CM

## 2016-11-05 DIAGNOSIS — R52 Pain, unspecified: Secondary | ICD-10-CM

## 2016-11-05 MED ORDER — OXYCODONE HCL 5 MG PO TABS: 5.0000 mg | ORAL_TABLET | Freq: Four times a day (QID) | ORAL | 0 refills | Status: DC | PRN

## 2016-11-05 NOTE — Telephone Encounter (Signed)
Pt contacted office for refill request on the following medications:  oxyCODONE (OXY IR/ROXICODONE) 5 MG immediate release tablet   CB#[phone]/MW

## 2016-11-18 ENCOUNTER — Other Ambulatory Visit: Payer: Self-pay | Admitting: Cardiovascular Disease

## 2016-11-27 ENCOUNTER — Other Ambulatory Visit: Payer: Self-pay | Admitting: Family Medicine

## 2016-11-27 DIAGNOSIS — R52 Pain, unspecified: Secondary | ICD-10-CM

## 2016-11-27 DIAGNOSIS — M79604 Pain in right leg: Secondary | ICD-10-CM

## 2016-11-27 DIAGNOSIS — L905 Scar conditions and fibrosis of skin: Secondary | ICD-10-CM

## 2016-11-27 NOTE — Telephone Encounter (Signed)
Pt contacted office for refill request on the following medications:  oxyCODONE (OXY IR/ROXICODONE) 5 MG immediate release tablet  Last Rx: 11/05/16 LOV: 03/28/16 No F/U scheduled at this time.   Pt stated that he needs to pick this Rx up on Friday 11/30/16 because he is working out of town in [place] and is only coming home on the weekends. Pt stated that he would run out of the medication the following week before he comes home that following weekend. Pt stated that he isn't trying to get his medication early just the Rx. Please advise. Thanks TNP

## 2016-11-30 MED ORDER — OXYCODONE HCL 5 MG PO TABS: 5.0000 mg | ORAL_TABLET | Freq: Four times a day (QID) | ORAL | 0 refills | Status: DC | PRN

## 2016-11-30 NOTE — Telephone Encounter (Signed)
appt scheduled

## 2016-11-30 NOTE — Telephone Encounter (Signed)
prescription was filled 2 days early in August, 2 days early in July, and 3 days early in June. He can pick up prescription today, but next rx will not be written before 01-07-2017. Also he is overdue for o.v. and needs to schedule visit in October.

## 2016-11-30 NOTE — Telephone Encounter (Signed)
LMTCB

## 2016-12-13 DIAGNOSIS — Z794 Long term (current) use of insulin: Secondary | ICD-10-CM | POA: Diagnosis not present

## 2016-12-13 DIAGNOSIS — I739 Peripheral vascular disease, unspecified: Secondary | ICD-10-CM | POA: Diagnosis not present

## 2016-12-13 DIAGNOSIS — I6529 Occlusion and stenosis of unspecified carotid artery: Secondary | ICD-10-CM | POA: Diagnosis not present

## 2016-12-13 DIAGNOSIS — N183 Chronic kidney disease, stage 3 (moderate): Secondary | ICD-10-CM | POA: Diagnosis not present

## 2016-12-13 DIAGNOSIS — E1121 Type 2 diabetes mellitus with diabetic nephropathy: Secondary | ICD-10-CM | POA: Diagnosis not present

## 2016-12-13 DIAGNOSIS — I129 Hypertensive chronic kidney disease with stage 1 through stage 4 chronic kidney disease, or unspecified chronic kidney disease: Secondary | ICD-10-CM | POA: Diagnosis not present

## 2016-12-13 DIAGNOSIS — Z9225 Personal history of immunosupression therapy: Secondary | ICD-10-CM | POA: Diagnosis not present

## 2016-12-13 DIAGNOSIS — Z940 Kidney transplant status: Secondary | ICD-10-CM | POA: Diagnosis not present

## 2016-12-13 DIAGNOSIS — N2581 Secondary hyperparathyroidism of renal origin: Secondary | ICD-10-CM | POA: Diagnosis not present

## 2016-12-13 DIAGNOSIS — F17210 Nicotine dependence, cigarettes, uncomplicated: Secondary | ICD-10-CM | POA: Diagnosis not present

## 2016-12-18 DIAGNOSIS — I6523 Occlusion and stenosis of bilateral carotid arteries: Secondary | ICD-10-CM | POA: Diagnosis not present

## 2016-12-18 DIAGNOSIS — I6529 Occlusion and stenosis of unspecified carotid artery: Secondary | ICD-10-CM | POA: Diagnosis not present

## 2016-12-25 DIAGNOSIS — I70213 Atherosclerosis of native arteries of extremities with intermittent claudication, bilateral legs: Secondary | ICD-10-CM | POA: Diagnosis not present

## 2016-12-25 DIAGNOSIS — N182 Chronic kidney disease, stage 2 (mild): Secondary | ICD-10-CM | POA: Diagnosis not present

## 2016-12-25 DIAGNOSIS — I70208 Unspecified atherosclerosis of native arteries of extremities, other extremity: Secondary | ICD-10-CM | POA: Diagnosis not present

## 2016-12-25 DIAGNOSIS — Z940 Kidney transplant status: Secondary | ICD-10-CM | POA: Diagnosis not present

## 2016-12-25 DIAGNOSIS — E1322 Other specified diabetes mellitus with diabetic chronic kidney disease: Secondary | ICD-10-CM | POA: Diagnosis not present

## 2016-12-31 ENCOUNTER — Ambulatory Visit (INDEPENDENT_AMBULATORY_CARE_PROVIDER_SITE_OTHER): Payer: Medicare Other | Admitting: Family Medicine

## 2016-12-31 ENCOUNTER — Encounter: Payer: Self-pay | Admitting: Family Medicine

## 2016-12-31 VITALS — BP 110/58 | HR 100 | Temp 97.9°F | Resp 16 | Ht 71.0 in | Wt 233.0 lb

## 2016-12-31 DIAGNOSIS — E1121 Type 2 diabetes mellitus with diabetic nephropathy: Secondary | ICD-10-CM | POA: Diagnosis not present

## 2016-12-31 DIAGNOSIS — F1190 Opioid use, unspecified, uncomplicated: Secondary | ICD-10-CM | POA: Diagnosis not present

## 2016-12-31 DIAGNOSIS — L905 Scar conditions and fibrosis of skin: Secondary | ICD-10-CM | POA: Diagnosis not present

## 2016-12-31 DIAGNOSIS — M79604 Pain in right leg: Secondary | ICD-10-CM

## 2016-12-31 DIAGNOSIS — Z23 Encounter for immunization: Secondary | ICD-10-CM

## 2016-12-31 DIAGNOSIS — R52 Pain, unspecified: Secondary | ICD-10-CM

## 2016-12-31 DIAGNOSIS — F419 Anxiety disorder, unspecified: Secondary | ICD-10-CM

## 2016-12-31 LAB — POCT UA - MICROALBUMIN: Microalbumin Ur, POC: 100 mg/L

## 2016-12-31 MED ORDER — OXYCODONE HCL 5 MG PO TABS: 5.0000 mg | ORAL_TABLET | Freq: Four times a day (QID) | ORAL | 0 refills | Status: DC | PRN

## 2016-12-31 NOTE — Progress Notes (Signed)
Patient: James Moreno Male    DOB: 02-19-1966   51 y.o.   MRN: [id number] Visit Date: 12/31/2016  Today's Provider: Lelon Huh, MD   Chief Complaint  Patient presents with  . Follow-up  . Hypertension  . Hyperlipidemia   Subjective:    HPI   Hypertension, follow-up:  BP Readings from Last 3 Encounters:  12/31/16 (!) 110/58  03/28/16 130/70  11/08/15 130/72    He was last seen for hypertension 8 months ago.  BP at that visit was 130/70. Management since that visit includes; no changes.He reports good compliance with treatment. none He is having side effects. none He is exercising. He is adherent to low salt diet.   Outside blood pressures are not checking. He is experiencing none.  Patient denies none.   Cardiovascular risk factors include diabetes mellitus.  Use of agents associated with hypertension: none.   ------------------------------------------------------------------------    Lipid/Cholesterol, Follow-up:   Last seen for this 8 months ago.  Management since that visit includes; labs checked, no changes.  Last Lipid Panel:    Component Value Date/Time   CHOL 172 03/28/2016 1048   TRIG 357 (H) 03/28/2016 1048   HDL 37 (L) 03/28/2016 1048   CHOLHDL 4.6 03/28/2016 1048   LDLCALC 64 03/28/2016 1048    He reports good compliance with treatment. He is not having side effects. none  Wt Readings from Last 3 Encounters:  12/31/16 233 lb (105.7 kg)  03/28/16 229 lb (103.9 kg)  11/08/15 216 lb (98 kg)    ------------------------------------------------------------------------  Anxiety From 03/28/2016- States alprazolam is still working well, is taking three times every day, usually a full tablets.   Chronic Pain of right lower extremity From 03/28/2016-refilled oxyCODONE (OXY IR/ROXICODONE) 5 MG immediate release tablet. UDS done.   He reports he has gone back to working, working as a Freight forwarder for [employer]. Has been working a lot in  [place] and states he is being by nephrologist Era Skeen when he is up there.    No Known Allergies   Current Outpatient Prescriptions:  .  alprazolam (XANAX) 2 MG tablet, Take 1/2 to 1 Tablet 3 Times Daily As Needed, Disp: 90 tablet, Rfl: 5 .  Blood Glucose Monitoring Suppl (GLUCOCOM BLOOD GLUCOSE MONITOR) DEVI, Frequency:ONCE   Dosage:0.0     Instructions:  Note:Dose: N/A, Disp: , Rfl:  .  carvedilol (COREG) 6.25 MG tablet, TAKE 1 TABLET TWICE A DAY WITH MEALS, Disp: 180 tablet, Rfl: 3 .  cinacalcet (SENSIPAR) 30 MG tablet, Take 30 mg by mouth daily., Disp: , Rfl:  .  insulin aspart (NOVOLOG) 100 UNIT/ML injection, Inject 10 Units into the skin 3 (three) times daily before meals. Sliding scale, Disp: , Rfl:  .  Insulin Degludec (TRESIBA FLEXTOUCH ), Inject 50 Units into the skin daily., Disp: , Rfl:  .  losartan (COZAAR) 50 MG tablet, , Disp: , Rfl: 11 .  lovastatin (MEVACOR) 40 MG tablet, Take 40 mg by mouth at bedtime., Disp: , Rfl:  .  mycophenolate (MYFORTIC) 180 MG EC tablet, Take 180 mg by mouth 2 (two) times daily., Disp: , Rfl:  .  omeprazole (PRILOSEC) 20 MG capsule, Take 20 mg by mouth daily., Disp: , Rfl:  .  oxyCODONE (OXY IR/ROXICODONE) 5 MG immediate release tablet, Take 1-2 tablets (5-10 mg total) by mouth every 6 (six) hours as needed for severe pain., Disp: 240 tablet, Rfl: 0 .  sildenafil (VIAGRA) 100 MG tablet, Take  0.5-1 tablets (50-100 mg total) by mouth daily as needed for erectile dysfunction., Disp: 30 tablet, Rfl: 5 .  tacrolimus (PROGRAF) 1 MG capsule, Takes 5 tablets every morning and 4 tablets every evening, Disp: , Rfl: 11  Review of Systems  Constitutional: Negative for appetite change, chills and fever.  Respiratory: Negative for chest tightness, shortness of breath and wheezing.   Cardiovascular: Negative for chest pain and palpitations.  Gastrointestinal: Negative for abdominal pain, nausea and vomiting.    Social History  Substance Use Topics    . Smoking status: Current Some Day Smoker    Packs/day: 0.50    Years: 31.00    Types: Cigarettes  . Smokeless tobacco: Former Systems developer    Quit date: 11/26/2011     Comment: Quit 3 months ago  . Alcohol use No     Comment: Excessive alcohol consumption in the past. Quit around 2016   Objective:   BP (!) 110/58 (BP Location: Right Arm, Patient Position: Sitting, Cuff Size: Large)   Pulse 100   Temp 97.9 F (36.6 C) (Oral)   Resp 16   Ht 5\' 11"  (1.803 m)   Wt 233 lb (105.7 kg)   SpO2 96%   BMI 32.50 kg/m  Vitals:   12/31/16 0903  BP: (!) 110/58  Pulse: 100  Resp: 16  Temp: 97.9 F (36.6 C)  TempSrc: Oral  SpO2: 96%  Weight: 233 lb (105.7 kg)  Height: 5\' 11"  (1.803 m)   Depression screen [facility] 2/9 12/31/2016  Decreased Interest 0  Down, Depressed, Hopeless 0  PHQ - 2 Score 0  Altered sleeping 0  Tired, decreased energy 0  Change in appetite 0  Feeling bad or failure about yourself  0  Trouble concentrating 0  Moving slowly or fidgety/restless 0  Suicidal thoughts 0  PHQ-9 Score 0  Difficult doing work/chores Not difficult at all     Physical Exam   General Appearance:    Alert, cooperative, no distress  Eyes:    PERRL, conjunctiva/corneas clear, EOM's intact       Lungs:     Clear to auscultation bilaterally, respirations unlabored  Heart:    Regular rate and rhythm  Neurologic:   Awake, alert, oriented x 3. No apparent focal neurological           defect.           Assessment & Plan:     1. Chronic, continuous use of opioids  - Drug Screen 10 W/Conf, Se  2. Pain of right lower extremity  - oxyCODONE (OXY IR/ROXICODONE) 5 MG immediate release tablet; Take 1-2 tablets (5-10 mg total) by mouth every 6 (six) hours as needed for severe pain.  Dispense: 240 tablet; Refill: 0 - Drug Screen 10 W/Conf, Se  3. Type 2 diabetes mellitus with diabetic nephropathy, without long-term current use of insulin (HCC) Continue regular follow up Dr. Carmela Rima.  - POCT UA -  Microalbumin  4. Pain in surgical scar  - oxyCODONE (OXY IR/ROXICODONE) 5 MG immediate release tablet; Take 1-2 tablets (5-10 mg total) by mouth every 6 (six) hours as needed for severe pain.  Dispense: 240 tablet; Refill: 0 - Drug Screen 10 W/Conf, Se  5. Need for influenza vaccination  - Flu Vaccine QUAD 36+ mos IM  6. Anxiety Stable on current dose of alprazolam.   Return in about 6 months (around 07/01/2017).       Lelon Huh, MD  [facility]

## 2017-01-02 ENCOUNTER — Telehealth: Payer: Self-pay | Admitting: *Deleted

## 2017-01-02 NOTE — Telephone Encounter (Signed)
Ok given

## 2017-01-02 NOTE — Telephone Encounter (Signed)
[place] called office with drug interaction with oxycodone and alprazolam. [facility] wanted to make sure provider is aware.  [phone]

## 2017-01-02 NOTE — Telephone Encounter (Signed)
Yes, I am aware. We are monitoring. Ok to fill.

## 2017-01-05 LAB — PAIN MGMT, PROFILE 8 W/CONF, U
6 Acetylmorphine: NEGATIVE ng/mL (ref <10)
Alcohol Metabolites: NEGATIVE ng/mL (ref <500)
Amphetamines: NEGATIVE ng/mL (ref <500)
Benzodiazepines: NEGATIVE ng/mL (ref <100)
Buprenorphine, Urine: NEGATIVE ng/mL (ref <5)
Cocaine Metabolite: NEGATIVE ng/mL (ref <150)
Codeine: NEGATIVE ng/mL (ref <50)
Creatinine: 133.4 mg/dL
Hydrocodone: NEGATIVE ng/mL (ref <50)
Hydromorphone: NEGATIVE ng/mL (ref <50)
MDMA: NEGATIVE ng/mL (ref <500)
Marijuana Metabolite: NEGATIVE ng/mL (ref <20)
Morphine: NEGATIVE ng/mL (ref <50)
Norhydrocodone: NEGATIVE ng/mL (ref <50)
Noroxycodone: 5063 ng/mL — ABNORMAL HIGH (ref <50)
Opiates: NEGATIVE ng/mL (ref <100)
Oxidant: NEGATIVE ug/mL (ref <200)
Oxycodone: 2543 ng/mL — ABNORMAL HIGH (ref <50)
Oxycodone: POSITIVE ng/mL — AB (ref <100)
Oxymorphone: 1660 ng/mL — ABNORMAL HIGH (ref <50)
pH: 5.83 (ref 4.5–9.0)

## 2017-01-30 ENCOUNTER — Other Ambulatory Visit: Payer: Self-pay

## 2017-01-30 DIAGNOSIS — M79604 Pain in right leg: Secondary | ICD-10-CM

## 2017-01-30 DIAGNOSIS — R52 Pain, unspecified: Secondary | ICD-10-CM

## 2017-01-30 DIAGNOSIS — L905 Scar conditions and fibrosis of skin: Secondary | ICD-10-CM

## 2017-01-30 NOTE — Telephone Encounter (Signed)
Patient is requesting a paper RX for all medications that Dr. Caryn Section prescribes to be picked up on Friday. Patient states his insurance has changed due to his wife being active  in the [employer]. He did have [employer] only and now he has Nurse, mental health and Medicare. He states he needs new RX for medications so he can get refills approved. CB# [phone]

## 2017-01-31 MED ORDER — SILDENAFIL CITRATE 100 MG PO TABS: 50.0000 mg | ORAL_TABLET | Freq: Every day | ORAL | 5 refills | Status: DC | PRN

## 2017-01-31 MED ORDER — OXYCODONE HCL 5 MG PO TABS: 5.0000 mg | ORAL_TABLET | Freq: Four times a day (QID) | ORAL | 0 refills | Status: DC | PRN

## 2017-01-31 MED ORDER — ALPRAZOLAM 2 MG PO TABS: ORAL_TABLET | ORAL | 5 refills | Status: DC

## 2017-02-11 DIAGNOSIS — I70218 Atherosclerosis of native arteries of extremities with intermittent claudication, other extremity: Secondary | ICD-10-CM | POA: Diagnosis not present

## 2017-02-11 DIAGNOSIS — I6529 Occlusion and stenosis of unspecified carotid artery: Secondary | ICD-10-CM | POA: Diagnosis not present

## 2017-02-11 DIAGNOSIS — I6502 Occlusion and stenosis of left vertebral artery: Secondary | ICD-10-CM | POA: Diagnosis not present

## 2017-02-11 DIAGNOSIS — I6523 Occlusion and stenosis of bilateral carotid arteries: Secondary | ICD-10-CM | POA: Diagnosis not present

## 2017-02-22 ENCOUNTER — Other Ambulatory Visit: Payer: Self-pay | Admitting: Family Medicine

## 2017-02-22 DIAGNOSIS — R52 Pain, unspecified: Secondary | ICD-10-CM

## 2017-02-22 DIAGNOSIS — L905 Scar conditions and fibrosis of skin: Secondary | ICD-10-CM

## 2017-02-22 DIAGNOSIS — M79604 Pain in right leg: Secondary | ICD-10-CM

## 2017-02-22 NOTE — Telephone Encounter (Signed)
Pt contacted office for refill request on the following medications:  oxyCODONE (OXY IR/ROXICODONE) 5 MG immediate release tablet   alprazolam (XANAX) 2 MG tablet   Pt states she is working out of town and is asking for these to be sent to [name] at [address], [place]  [id number]  [phone]

## 2017-02-25 NOTE — Telephone Encounter (Signed)
Last filled 01-31-2017 on both medications

## 2017-02-27 NOTE — Telephone Encounter (Signed)
Pt is asking if this will be sent in by Friday morning/MW

## 2017-02-27 NOTE — Telephone Encounter (Signed)
Yes, will send electronically on Friday

## 2017-02-28 NOTE — Telephone Encounter (Signed)
Pt stated that he had to come home unexpectedly due to pending death in the family. Pt is requesting that the Rx for oxyCODONE (OXY IR/ROXICODONE) 5 MG immediate release tablet   alprazolam (XANAX) 2 MG tablet  Be sent to [place] instead of the out of town pharmacy that he had previously requested. Please advise. Thanks TNP

## 2017-03-01 MED ORDER — OXYCODONE HCL 5 MG PO TABS: 5.0000 mg | ORAL_TABLET | Freq: Four times a day (QID) | ORAL | 0 refills | Status: DC | PRN

## 2017-03-01 MED ORDER — ALPRAZOLAM 2 MG PO TABS: ORAL_TABLET | ORAL | 5 refills | Status: DC

## 2017-03-01 NOTE — Telephone Encounter (Signed)
Patient advised.

## 2017-03-13 DIAGNOSIS — E1165 Type 2 diabetes mellitus with hyperglycemia: Secondary | ICD-10-CM | POA: Diagnosis not present

## 2017-03-13 DIAGNOSIS — E785 Hyperlipidemia, unspecified: Secondary | ICD-10-CM | POA: Diagnosis not present

## 2017-03-13 DIAGNOSIS — I10 Essential (primary) hypertension: Secondary | ICD-10-CM | POA: Diagnosis not present

## 2017-03-27 ENCOUNTER — Other Ambulatory Visit: Payer: Self-pay

## 2017-03-27 DIAGNOSIS — L905 Scar conditions and fibrosis of skin: Secondary | ICD-10-CM

## 2017-03-27 DIAGNOSIS — M79604 Pain in right leg: Secondary | ICD-10-CM

## 2017-03-27 DIAGNOSIS — R52 Pain, unspecified: Secondary | ICD-10-CM

## 2017-03-27 NOTE — Telephone Encounter (Signed)
Patient is requesting a refill on Alprazolam 2 mg and Oxycodone 5 mg. Patient states he is working out of town and requesting refills be sent to [facility] in [place], [place]  #[phone]

## 2017-03-27 NOTE — Telephone Encounter (Signed)
Please review. Thanks!  

## 2017-03-29 NOTE — Telephone Encounter (Signed)
Patient has called back to get refills on the Alprazolam and Oxycodone.  He needs this by tomorrow please [facility] in [place]

## 2017-04-01 MED ORDER — OXYCODONE HCL 5 MG PO TABS: 5.0000 mg | ORAL_TABLET | Freq: Four times a day (QID) | ORAL | 0 refills | Status: DC | PRN

## 2017-04-01 MED ORDER — ALPRAZOLAM 2 MG PO TABS: ORAL_TABLET | ORAL | 5 refills | Status: DC

## 2017-04-01 NOTE — Telephone Encounter (Addendum)
i'll resend to prescription in [place], please call [facility] and cancel prescriptions. Thanks.

## 2017-04-01 NOTE — Telephone Encounter (Signed)
Canceled Rx as below.

## 2017-04-01 NOTE — Addendum Note (Signed)
Addended by: Birdie Sons on: 04/01/2017 10:22 AM   Modules accepted: Orders

## 2017-04-01 NOTE — Telephone Encounter (Addendum)
Dr. Caryn Section, the patient's meds were sent into the wrong pharmacy. Do I cancel the Rx's that were sent into [address] before the new Rx can be sent? Or can it be transferred? Please advise. I have updated the pharmacy that he wants [place] to send them to.

## 2017-04-03 DIAGNOSIS — N183 Chronic kidney disease, stage 3 (moderate): Secondary | ICD-10-CM | POA: Diagnosis not present

## 2017-04-03 DIAGNOSIS — E1122 Type 2 diabetes mellitus with diabetic chronic kidney disease: Secondary | ICD-10-CM | POA: Diagnosis not present

## 2017-04-03 DIAGNOSIS — E1121 Type 2 diabetes mellitus with diabetic nephropathy: Secondary | ICD-10-CM | POA: Diagnosis not present

## 2017-04-03 DIAGNOSIS — Z940 Kidney transplant status: Secondary | ICD-10-CM | POA: Diagnosis not present

## 2017-04-29 ENCOUNTER — Other Ambulatory Visit: Payer: Self-pay | Admitting: Family Medicine

## 2017-04-29 DIAGNOSIS — R52 Pain, unspecified: Secondary | ICD-10-CM

## 2017-04-29 DIAGNOSIS — L905 Scar conditions and fibrosis of skin: Secondary | ICD-10-CM

## 2017-04-29 DIAGNOSIS — M79604 Pain in right leg: Secondary | ICD-10-CM

## 2017-04-29 NOTE — Telephone Encounter (Signed)
Pt contacted office for refill request on the following medications:  oxyCODONE (OXY IR/ROXICODONE) 5 MG immediate release tablet   [facility] [address] Pt stated not working out of town and requested Management consultant.  Last Rx: 04/01/17 Please advise. Thanks TNP

## 2017-04-29 NOTE — Telephone Encounter (Signed)
Please review. Thanks!  

## 2017-05-01 MED ORDER — OXYCODONE HCL 5 MG PO TABS: 5.0000 mg | ORAL_TABLET | Freq: Four times a day (QID) | ORAL | 0 refills | Status: DC | PRN

## 2017-05-16 DIAGNOSIS — N186 End stage renal disease: Secondary | ICD-10-CM | POA: Diagnosis not present

## 2017-05-16 DIAGNOSIS — I10 Essential (primary) hypertension: Secondary | ICD-10-CM | POA: Diagnosis not present

## 2017-05-16 DIAGNOSIS — N2581 Secondary hyperparathyroidism of renal origin: Secondary | ICD-10-CM | POA: Diagnosis not present

## 2017-05-16 DIAGNOSIS — R809 Proteinuria, unspecified: Secondary | ICD-10-CM | POA: Diagnosis not present

## 2017-05-16 DIAGNOSIS — Z940 Kidney transplant status: Secondary | ICD-10-CM | POA: Diagnosis not present

## 2017-05-21 DIAGNOSIS — N2581 Secondary hyperparathyroidism of renal origin: Secondary | ICD-10-CM | POA: Diagnosis not present

## 2017-05-21 DIAGNOSIS — E1165 Type 2 diabetes mellitus with hyperglycemia: Secondary | ICD-10-CM | POA: Diagnosis not present

## 2017-05-21 DIAGNOSIS — E785 Hyperlipidemia, unspecified: Secondary | ICD-10-CM | POA: Diagnosis not present

## 2017-05-21 DIAGNOSIS — I10 Essential (primary) hypertension: Secondary | ICD-10-CM | POA: Diagnosis not present

## 2017-05-27 ENCOUNTER — Other Ambulatory Visit: Payer: Self-pay | Admitting: Family Medicine

## 2017-05-27 DIAGNOSIS — R52 Pain, unspecified: Secondary | ICD-10-CM

## 2017-05-27 DIAGNOSIS — L905 Scar conditions and fibrosis of skin: Secondary | ICD-10-CM

## 2017-05-27 DIAGNOSIS — M79604 Pain in right leg: Secondary | ICD-10-CM

## 2017-05-27 MED ORDER — OXYCODONE HCL 5 MG PO TABS: 5.0000 mg | ORAL_TABLET | Freq: Four times a day (QID) | ORAL | 0 refills | Status: DC | PRN

## 2017-05-27 NOTE — Telephone Encounter (Signed)
Pt contacted office for refill request on the following medications:  oxyCODONE (OXY IR/ROXICODONE) 5 MG immediate release tablet   [facility] [address]  Last Rx: 05/01/17 LOV: 12/31/16 Please advise. Thanks TNP

## 2017-05-30 DIAGNOSIS — E1121 Type 2 diabetes mellitus with diabetic nephropathy: Secondary | ICD-10-CM | POA: Diagnosis not present

## 2017-05-30 DIAGNOSIS — N189 Chronic kidney disease, unspecified: Secondary | ICD-10-CM | POA: Diagnosis not present

## 2017-05-30 DIAGNOSIS — N2581 Secondary hyperparathyroidism of renal origin: Secondary | ICD-10-CM | POA: Diagnosis not present

## 2017-05-30 DIAGNOSIS — E785 Hyperlipidemia, unspecified: Secondary | ICD-10-CM | POA: Diagnosis not present

## 2017-05-30 DIAGNOSIS — I10 Essential (primary) hypertension: Secondary | ICD-10-CM | POA: Diagnosis not present

## 2017-05-30 DIAGNOSIS — E1165 Type 2 diabetes mellitus with hyperglycemia: Secondary | ICD-10-CM | POA: Diagnosis not present

## 2017-05-30 DIAGNOSIS — I7389 Other specified peripheral vascular diseases: Secondary | ICD-10-CM | POA: Diagnosis not present

## 2017-05-30 DIAGNOSIS — E08311 Diabetes mellitus due to underlying condition with unspecified diabetic retinopathy with macular edema: Secondary | ICD-10-CM | POA: Diagnosis not present

## 2017-05-30 DIAGNOSIS — E0859 Diabetes mellitus due to underlying condition with other circulatory complications: Secondary | ICD-10-CM | POA: Diagnosis not present

## 2017-05-30 DIAGNOSIS — E118 Type 2 diabetes mellitus with unspecified complications: Secondary | ICD-10-CM | POA: Diagnosis not present

## 2017-06-13 DIAGNOSIS — E1165 Type 2 diabetes mellitus with hyperglycemia: Secondary | ICD-10-CM | POA: Diagnosis not present

## 2017-06-13 DIAGNOSIS — N189 Chronic kidney disease, unspecified: Secondary | ICD-10-CM | POA: Diagnosis not present

## 2017-06-13 DIAGNOSIS — I7389 Other specified peripheral vascular diseases: Secondary | ICD-10-CM | POA: Diagnosis not present

## 2017-06-13 DIAGNOSIS — N2581 Secondary hyperparathyroidism of renal origin: Secondary | ICD-10-CM | POA: Diagnosis not present

## 2017-06-13 DIAGNOSIS — E0859 Diabetes mellitus due to underlying condition with other circulatory complications: Secondary | ICD-10-CM | POA: Diagnosis not present

## 2017-06-13 DIAGNOSIS — E1121 Type 2 diabetes mellitus with diabetic nephropathy: Secondary | ICD-10-CM | POA: Diagnosis not present

## 2017-06-13 DIAGNOSIS — E785 Hyperlipidemia, unspecified: Secondary | ICD-10-CM | POA: Diagnosis not present

## 2017-06-13 DIAGNOSIS — E118 Type 2 diabetes mellitus with unspecified complications: Secondary | ICD-10-CM | POA: Diagnosis not present

## 2017-06-13 DIAGNOSIS — I10 Essential (primary) hypertension: Secondary | ICD-10-CM | POA: Diagnosis not present

## 2017-06-13 DIAGNOSIS — E08311 Diabetes mellitus due to underlying condition with unspecified diabetic retinopathy with macular edema: Secondary | ICD-10-CM | POA: Diagnosis not present

## 2017-06-24 ENCOUNTER — Other Ambulatory Visit: Payer: Self-pay | Admitting: Family Medicine

## 2017-06-24 DIAGNOSIS — R52 Pain, unspecified: Secondary | ICD-10-CM

## 2017-06-24 DIAGNOSIS — L905 Scar conditions and fibrosis of skin: Secondary | ICD-10-CM

## 2017-06-24 DIAGNOSIS — M79604 Pain in right leg: Secondary | ICD-10-CM

## 2017-06-24 NOTE — Telephone Encounter (Signed)
Pt requesting refill of Oxycodone 5 mg sent to [place]

## 2017-06-25 MED ORDER — OXYCODONE HCL 5 MG PO TABS
5.0000 mg | ORAL_TABLET | Freq: Four times a day (QID) | ORAL | 0 refills | Status: DC | PRN
Start: 2017-06-25 — End: 2017-07-24

## 2017-07-01 ENCOUNTER — Telehealth: Payer: Self-pay | Admitting: Family Medicine

## 2017-07-01 NOTE — Telephone Encounter (Signed)
Patient advised and agrees to discuss at next office visit.

## 2017-07-01 NOTE — Telephone Encounter (Signed)
Patient was due today for 6 month follow-up. Called pt back and scheduled appt. Please advise message below?

## 2017-07-01 NOTE — Telephone Encounter (Signed)
Patient needs to know when he is due back for a visit here and also he is having issues sleeping at night and wants to get an order for a "hospital bed" so he can elevate it to help him sleep.

## 2017-07-01 NOTE — Telephone Encounter (Signed)
Need to discuss at office visit.

## 2017-07-03 ENCOUNTER — Ambulatory Visit (INDEPENDENT_AMBULATORY_CARE_PROVIDER_SITE_OTHER): Payer: Medicare Other | Admitting: Family Medicine

## 2017-07-03 ENCOUNTER — Telehealth: Payer: Self-pay

## 2017-07-03 ENCOUNTER — Encounter: Payer: Self-pay | Admitting: Family Medicine

## 2017-07-03 VITALS — BP 140/70 | HR 100 | Temp 98.0°F | Resp 16 | Wt 236.0 lb

## 2017-07-03 DIAGNOSIS — L905 Scar conditions and fibrosis of skin: Secondary | ICD-10-CM

## 2017-07-03 DIAGNOSIS — N2889 Other specified disorders of kidney and ureter: Secondary | ICD-10-CM

## 2017-07-03 DIAGNOSIS — R52 Pain, unspecified: Secondary | ICD-10-CM

## 2017-07-03 DIAGNOSIS — Z0283 Encounter for blood-alcohol and blood-drug test: Secondary | ICD-10-CM | POA: Diagnosis not present

## 2017-07-03 DIAGNOSIS — E669 Obesity, unspecified: Secondary | ICD-10-CM

## 2017-07-03 DIAGNOSIS — Z940 Kidney transplant status: Secondary | ICD-10-CM | POA: Diagnosis not present

## 2017-07-03 DIAGNOSIS — Z6832 Body mass index (BMI) 32.0-32.9, adult: Secondary | ICD-10-CM

## 2017-07-03 DIAGNOSIS — I151 Hypertension secondary to other renal disorders: Secondary | ICD-10-CM | POA: Diagnosis not present

## 2017-07-03 DIAGNOSIS — F1190 Opioid use, unspecified, uncomplicated: Secondary | ICD-10-CM

## 2017-07-03 DIAGNOSIS — F419 Anxiety disorder, unspecified: Secondary | ICD-10-CM

## 2017-07-03 DIAGNOSIS — Z1211 Encounter for screening for malignant neoplasm of colon: Secondary | ICD-10-CM | POA: Diagnosis not present

## 2017-07-03 DIAGNOSIS — E781 Pure hyperglyceridemia: Secondary | ICD-10-CM | POA: Insufficient documentation

## 2017-07-03 DIAGNOSIS — R768 Other specified abnormal immunological findings in serum: Secondary | ICD-10-CM | POA: Insufficient documentation

## 2017-07-03 NOTE — Progress Notes (Signed)
Patient: James Moreno Male    DOB: 01-Nov-1965   52 y.o.   MRN: [id number] Visit Date: 07/03/2017  Today's Provider: Lelon Huh, MD   No chief complaint on file.  Subjective:    HPI    Hypertension, follow-up:  BP Readings from Last 3 Encounters:  07/03/17 140/70  12/31/16 (!) 110/58  03/28/16 130/70    He was last seen for hypertension 1 year ago.  BP at that visit was 130/70. Management since that visit includes; no changes.He reports good compliance with treatment. He is not having side effects. none He is not exercising. He is adherent to low salt diet.   Outside blood pressures are normal. He is experiencing none.  Patient denies none.   Cardiovascular risk factors include diabetes mellitus.  Use of agents associated with hypertension: none.   ------------------------------------------------------------------------    Lipid/Cholesterol, Follow-up:   Last seen for this 1 years ago.  Management since that visit includes; labs checked, no changes.  Last Lipid Panel:    Component Value Date/Time   CHOL 172 03/28/2016 1048   TRIG 357 (H) 03/28/2016 1048   HDL 37 (L) 03/28/2016 1048   CHOLHDL 4.6 03/28/2016 1048   LDLCALC 64 03/28/2016 1048   He stats that since then Dr. Lenis Noon has added fenofibrate to his miedcations.  He reports good compliance with treatment. He is not having side effects. none  Wt Readings from Last 3 Encounters:  07/03/17 236 lb (107 kg)  12/31/16 233 lb (105.7 kg)  03/28/16 229 lb (103.9 kg)    ----------------------------------------------------------------  Diabetes From 12/31/2016-Continue regular follow up with Dr. Carmela Rima .  Chronic pain Has chronic abdominal pain since kidney transplant. He states that current regiment of oxycodone remains effective. He often only takes one at a time, but then has to take a little more frequently so he usually ends up taking 40mg  throughout the course of a day.    Anxiety From 12/31/2016. States he takes one tablet every morning, sometimes take 1/2 tablet once or twice through the day, and usually one in the evening to help sleep. States he feels it continues to work well and is not sleepy or tired when he takes it.     No Known Allergies   Current Outpatient Medications:  .  alprazolam (XANAX) 2 MG tablet, Take 1/2 to 1 Tablet 3 Times Daily As Needed, Disp: 90 tablet, Rfl: 5 .  Blood Glucose Monitoring Suppl (GLUCOCOM BLOOD GLUCOSE MONITOR) DEVI, Frequency:ONCE   Dosage:0.0     Instructions:  Note:Dose: N/A, Disp: , Rfl:  .  carvedilol (COREG) 6.25 MG tablet, TAKE 1 TABLET TWICE A DAY WITH MEALS, Disp: 180 tablet, Rfl: 3 .  cinacalcet (SENSIPAR) 30 MG tablet, Take 30 mg by mouth daily., Disp: , Rfl:  .  fenofibrate (TRICOR) 145 MG tablet, Take 145 mg by mouth daily., Disp: , Rfl:  .  insulin aspart (NOVOLOG) 100 UNIT/ML injection, Inject 10 Units into the skin 3 (three) times daily before meals. Sliding scale, Disp: , Rfl:  .  Insulin Degludec (TRESIBA FLEXTOUCH [place]), Inject 30 Units into the skin daily. , Disp: , Rfl:  .  losartan (COZAAR) 50 MG tablet, , Disp: , Rfl: 11 .  lovastatin (MEVACOR) 40 MG tablet, Take 40 mg by mouth at bedtime., Disp: , Rfl:  .  mycophenolate (MYFORTIC) 180 MG EC tablet, Take 180 mg by mouth 2 (two) times daily., Disp: , Rfl:  .  omeprazole (PRILOSEC)  20 MG capsule, Take 20 mg by mouth daily., Disp: , Rfl:  .  oxyCODONE (OXY IR/ROXICODONE) 5 MG immediate release tablet, Take 1-2 tablets (5-10 mg total) by mouth every 6 (six) hours as needed for severe pain., Disp: 240 tablet, Rfl: 0 .  sildenafil (VIAGRA) 100 MG tablet, Take 0.5-1 tablets (50-100 mg total) daily as needed by mouth for erectile dysfunction., Disp: 30 tablet, Rfl: 5 .  tacrolimus (PROGRAF) 1 MG capsule, Takes 5 tablets every morning and 4 tablets every evening, Disp: , Rfl: 11  Review of Systems  Constitutional: Negative for appetite change, chills and  fever.  Respiratory: Negative for chest tightness, shortness of breath and wheezing.   Cardiovascular: Negative for chest pain and palpitations.  Gastrointestinal: Negative for abdominal pain, nausea and vomiting.    Social History   Tobacco Use  . Smoking status: Current Some Day Smoker    Packs/day: 0.50    Years: 31.00    Pack years: 15.50    Types: Cigarettes  . Smokeless tobacco: Former Systems developer    Quit date: 11/26/2011  . Tobacco comment: Quit 3 months ago  Substance Use Topics  . Alcohol use: No    Alcohol/week: 0.0 oz    Comment: Excessive alcohol consumption in the past. Quit around 2016   Objective:   BP 140/70 (BP Location: Left Arm, Patient Position: Sitting, Cuff Size: Large)   Pulse 100   Temp 98 F (36.7 C) (Oral)   Resp 16   Wt 236 lb (107 kg)   SpO2 95%   BMI 32.92 kg/m     Physical Exam  General Appearance:    Alert, cooperative, no distress, obese  Eyes:    PERRL, conjunctiva/corneas clear, EOM's intact       Lungs:     Clear to auscultation bilaterally, respirations unlabored  Heart:    Regular rate and rhythm  Neurologic:   Awake, alert, oriented x 3. No apparent focal neurological           defect.           Assessment & Plan:     1. Anxiety Doing well with current regiment of alprazolam.   2. Hypertension secondary to other renal disorders Well controlled.  Continue current medications.    3. Renal transplant, status post Stable. Continue regular follow up Dr. Holley Raring.   4. Hypertriglyceridemia On fenofibrate managed by Dr. Carmela Rima.  Reminded to get yearly diabetic eye exam.  5. Pain in surgical scar Pain well controlled, continue current medication regiment.   6. Encounter for drug screening  - Pain Mgt Scrn (14 Drugs), Ur  7. Chronic, continuous use of opioids   8. Colon cancer screening  - Cologuard  9. Class 1 obesity with serious comorbidity and body mass index (BMI) of 32.0 to 32.9 in adult, unspecified obesity  type Counseled regarding prudent diet and regular exercise.   Return in about 6 months (around 01/02/2018).        Lelon Huh, MD  [facility]

## 2017-07-03 NOTE — Telephone Encounter (Signed)
Patient states he spoke with Dr. Caryn Section this morning at his OV about the tendons in his hand. He wanted to know if Dr. Caryn Section was going to prescribe a medication for that. CB# [phone]

## 2017-07-03 NOTE — Telephone Encounter (Signed)
Please advise 

## 2017-07-03 NOTE — Telephone Encounter (Signed)
There is not any medication for this, but orthopedist can injection cortisone which usually takes care of it.

## 2017-07-04 LAB — PAIN MGT SCRN (14 DRUGS), UR
Amphetamine Scrn, Ur: NEGATIVE ng/mL
BARBITURATE SCREEN URINE: NEGATIVE ng/mL
BENZODIAZEPINE SCREEN, URINE: NEGATIVE ng/mL
Buprenorphine, Urine: NEGATIVE ng/mL
CANNABINOIDS UR QL SCN: NEGATIVE ng/mL
Cocaine (Metab) Scrn, Ur: NEGATIVE ng/mL
Creatinine(Crt), U: 83 mg/dL (ref 20.0–300.0)
Fentanyl, Urine: NEGATIVE pg/mL
Meperidine Screen, Urine: NEGATIVE ng/mL
Methadone Screen, Urine: NEGATIVE ng/mL
OXYCODONE+OXYMORPHONE UR QL SCN: POSITIVE ng/mL — AB
Opiate Scrn, Ur: POSITIVE ng/mL — AB
Ph of Urine: 6.4 (ref 4.5–8.9)
Phencyclidine Qn, Ur: NEGATIVE ng/mL
Propoxyphene Scrn, Ur: NEGATIVE ng/mL
Tramadol Screen, Urine: NEGATIVE ng/mL

## 2017-07-04 NOTE — Telephone Encounter (Signed)
Patient was advised.  

## 2017-07-08 DIAGNOSIS — S92351D Displaced fracture of fifth metatarsal bone, right foot, subsequent encounter for fracture with routine healing: Secondary | ICD-10-CM | POA: Diagnosis not present

## 2017-07-08 DIAGNOSIS — M79642 Pain in left hand: Secondary | ICD-10-CM | POA: Diagnosis not present

## 2017-07-08 DIAGNOSIS — M79641 Pain in right hand: Secondary | ICD-10-CM | POA: Diagnosis not present

## 2017-07-11 DIAGNOSIS — E1165 Type 2 diabetes mellitus with hyperglycemia: Secondary | ICD-10-CM | POA: Diagnosis not present

## 2017-07-11 DIAGNOSIS — N189 Chronic kidney disease, unspecified: Secondary | ICD-10-CM | POA: Diagnosis not present

## 2017-07-11 DIAGNOSIS — E785 Hyperlipidemia, unspecified: Secondary | ICD-10-CM | POA: Diagnosis not present

## 2017-07-11 DIAGNOSIS — N2581 Secondary hyperparathyroidism of renal origin: Secondary | ICD-10-CM | POA: Diagnosis not present

## 2017-07-11 DIAGNOSIS — I10 Essential (primary) hypertension: Secondary | ICD-10-CM | POA: Diagnosis not present

## 2017-07-23 DIAGNOSIS — E669 Obesity, unspecified: Secondary | ICD-10-CM | POA: Diagnosis not present

## 2017-07-23 DIAGNOSIS — E08311 Diabetes mellitus due to underlying condition with unspecified diabetic retinopathy with macular edema: Secondary | ICD-10-CM | POA: Diagnosis not present

## 2017-07-23 DIAGNOSIS — N2581 Secondary hyperparathyroidism of renal origin: Secondary | ICD-10-CM | POA: Diagnosis not present

## 2017-07-23 DIAGNOSIS — Z6831 Body mass index (BMI) 31.0-31.9, adult: Secondary | ICD-10-CM | POA: Diagnosis not present

## 2017-07-23 DIAGNOSIS — I10 Essential (primary) hypertension: Secondary | ICD-10-CM | POA: Diagnosis not present

## 2017-07-23 DIAGNOSIS — N189 Chronic kidney disease, unspecified: Secondary | ICD-10-CM | POA: Diagnosis not present

## 2017-07-23 DIAGNOSIS — E785 Hyperlipidemia, unspecified: Secondary | ICD-10-CM | POA: Diagnosis not present

## 2017-07-23 DIAGNOSIS — I7389 Other specified peripheral vascular diseases: Secondary | ICD-10-CM | POA: Diagnosis not present

## 2017-07-23 DIAGNOSIS — E1165 Type 2 diabetes mellitus with hyperglycemia: Secondary | ICD-10-CM | POA: Diagnosis not present

## 2017-07-23 DIAGNOSIS — E118 Type 2 diabetes mellitus with unspecified complications: Secondary | ICD-10-CM | POA: Diagnosis not present

## 2017-07-23 DIAGNOSIS — E0859 Diabetes mellitus due to underlying condition with other circulatory complications: Secondary | ICD-10-CM | POA: Diagnosis not present

## 2017-07-23 DIAGNOSIS — E1121 Type 2 diabetes mellitus with diabetic nephropathy: Secondary | ICD-10-CM | POA: Diagnosis not present

## 2017-07-24 ENCOUNTER — Other Ambulatory Visit: Payer: Self-pay | Admitting: Family Medicine

## 2017-07-24 DIAGNOSIS — L905 Scar conditions and fibrosis of skin: Secondary | ICD-10-CM

## 2017-07-24 DIAGNOSIS — M79604 Pain in right leg: Secondary | ICD-10-CM

## 2017-07-24 DIAGNOSIS — R52 Pain, unspecified: Secondary | ICD-10-CM

## 2017-07-24 MED ORDER — OXYCODONE HCL 5 MG PO TABS: 5.0000 mg | ORAL_TABLET | Freq: Four times a day (QID) | ORAL | 0 refills | Status: DC | PRN

## 2017-07-24 NOTE — Telephone Encounter (Signed)
Pt need refill on his oxycodone 5mg   He uses [facility] [address]  Thanks, [employer]

## 2017-07-26 ENCOUNTER — Telehealth: Payer: Self-pay | Admitting: Family Medicine

## 2017-07-26 NOTE — Telephone Encounter (Signed)
Order for cologuard faxed to [facility] °

## 2017-08-06 DIAGNOSIS — Z1211 Encounter for screening for malignant neoplasm of colon: Secondary | ICD-10-CM | POA: Diagnosis not present

## 2017-08-18 LAB — COLOGUARD: Cologuard: NEGATIVE

## 2017-08-21 ENCOUNTER — Other Ambulatory Visit: Payer: Self-pay | Admitting: Family Medicine

## 2017-08-21 DIAGNOSIS — R52 Pain, unspecified: Secondary | ICD-10-CM

## 2017-08-21 DIAGNOSIS — L905 Scar conditions and fibrosis of skin: Secondary | ICD-10-CM

## 2017-08-21 DIAGNOSIS — M79604 Pain in right leg: Secondary | ICD-10-CM

## 2017-08-21 NOTE — Telephone Encounter (Signed)
Pt is requesting refill on oxyCODONE (OXY IR/ROXICODONE) 5 MG immediate release tablet.He would like this sent to [facility]

## 2017-08-22 MED ORDER — OXYCODONE HCL 5 MG PO TABS: 5.0000 mg | ORAL_TABLET | Freq: Four times a day (QID) | ORAL | 0 refills | Status: DC | PRN

## 2017-08-30 DIAGNOSIS — L03115 Cellulitis of right lower limb: Secondary | ICD-10-CM | POA: Diagnosis not present

## 2017-09-02 ENCOUNTER — Encounter: Payer: Self-pay | Admitting: Podiatry

## 2017-09-02 ENCOUNTER — Ambulatory Visit (INDEPENDENT_AMBULATORY_CARE_PROVIDER_SITE_OTHER): Payer: Medicare Other | Admitting: Podiatry

## 2017-09-02 ENCOUNTER — Ambulatory Visit: Payer: Medicare Other

## 2017-09-02 DIAGNOSIS — R52 Pain, unspecified: Secondary | ICD-10-CM

## 2017-09-02 DIAGNOSIS — R58 Hemorrhage, not elsewhere classified: Secondary | ICD-10-CM | POA: Diagnosis not present

## 2017-09-02 DIAGNOSIS — L97411 Non-pressure chronic ulcer of right heel and midfoot limited to breakdown of skin: Secondary | ICD-10-CM | POA: Diagnosis not present

## 2017-09-02 NOTE — Progress Notes (Signed)
This patient presents to this office for a second opinion of his painful right heel.James Moreno  He says he was treated at the [facility] and diagnosed with an infection right heel.  He says this right heel pain has been present for 2 weeks.  He says the heel started out with significant swelling on the outside of his right heel.  He says this has improved on the antibiotics.  He also says x-rays were taken which he did not bring to this office.   He says he has a localized area on the back of the right heel which causes him to walk in slippers.  He also has history of kidney transplant.  He presents for a second opinion and treatment of his painful right heel.  General Appearance  Alert, conversant and in no acute stress.  Vascular  Dorsalis pedis and posterior tibial  pulses are palpable  Right.  DP and PT are absent  B/L with absent digital hair noted.  .  Capillary return is within normal limits  bilaterally. Temperature is within normal limits  bilaterally.  Neurologic  Senn-Weinstein monofilament wire test within normal limits  Right.  LOPS diminished left foot.. Muscle power within normal limits bilaterally.  Nails Normal nails noted with no evidence of bacterial or fungal infection.  Orthopedic  No limitations of motion of motion feet .  No crepitus or effusions noted.  No bony pathology or digital deformities noted.  Skin  There is a localized black skin lesion on the planta aspect  Right heel.  THere is no evidence of redness or fluctuance or swelling right heel  There is evidence of ecchymosis right heel surrounding this blackened skin lesion.      Diabetic ulcer right heel   F/U infection right heel.     IE  X-rays were not taken at this visit.  He says that he will go to the [facility]  to pick up the x-rays and bring them back this afternoon.  There is a local area with a blackened skin lesion which is the focal point of his heel pain.  Debridement of the skin lesion reveals no evidence of  drainage or pus or fluctuance.  I am also concerned about the purplish discoloration noted on the lateral aspect of the right heel.  There is a similar skin lesion noted on the dorsal lateral aspect fifth MPJ right foot.  I am considering a possible foreign body anteriorly which lead to a cellulitis which was treated previously.  I also believe he may have had an insect bite  which has also led to a cellulitis.  Patient states that he was unable to acquire the x-rays.  Patient is to continue his antibiotics and soak his skin lesion and bandage the skin lesion.  He should return to the office in 3 days for further evaluation and treatment of this painful right heel   Gardiner Barefoot DPM

## 2017-09-07 DIAGNOSIS — N186 End stage renal disease: Secondary | ICD-10-CM | POA: Diagnosis not present

## 2017-09-07 DIAGNOSIS — E11621 Type 2 diabetes mellitus with foot ulcer: Secondary | ICD-10-CM | POA: Diagnosis not present

## 2017-09-07 DIAGNOSIS — M129 Arthropathy, unspecified: Secondary | ICD-10-CM | POA: Diagnosis not present

## 2017-09-07 DIAGNOSIS — Z79899 Other long term (current) drug therapy: Secondary | ICD-10-CM | POA: Diagnosis not present

## 2017-09-07 DIAGNOSIS — Z794 Long term (current) use of insulin: Secondary | ICD-10-CM | POA: Diagnosis not present

## 2017-09-07 DIAGNOSIS — E08621 Diabetes mellitus due to underlying condition with foot ulcer: Secondary | ICD-10-CM | POA: Diagnosis not present

## 2017-09-07 DIAGNOSIS — E1122 Type 2 diabetes mellitus with diabetic chronic kidney disease: Secondary | ICD-10-CM | POA: Diagnosis not present

## 2017-09-07 DIAGNOSIS — I120 Hypertensive chronic kidney disease with stage 5 chronic kidney disease or end stage renal disease: Secondary | ICD-10-CM | POA: Diagnosis not present

## 2017-09-07 DIAGNOSIS — L089 Local infection of the skin and subcutaneous tissue, unspecified: Secondary | ICD-10-CM | POA: Diagnosis not present

## 2017-09-07 DIAGNOSIS — F17210 Nicotine dependence, cigarettes, uncomplicated: Secondary | ICD-10-CM | POA: Diagnosis not present

## 2017-09-07 DIAGNOSIS — Z5181 Encounter for therapeutic drug level monitoring: Secondary | ICD-10-CM | POA: Diagnosis not present

## 2017-09-07 DIAGNOSIS — L97411 Non-pressure chronic ulcer of right heel and midfoot limited to breakdown of skin: Secondary | ICD-10-CM | POA: Diagnosis not present

## 2017-09-07 DIAGNOSIS — Z940 Kidney transplant status: Secondary | ICD-10-CM | POA: Diagnosis not present

## 2017-09-07 DIAGNOSIS — L97519 Non-pressure chronic ulcer of other part of right foot with unspecified severity: Secondary | ICD-10-CM | POA: Diagnosis not present

## 2017-09-07 DIAGNOSIS — L03115 Cellulitis of right lower limb: Secondary | ICD-10-CM | POA: Diagnosis not present

## 2017-09-07 DIAGNOSIS — E1165 Type 2 diabetes mellitus with hyperglycemia: Secondary | ICD-10-CM | POA: Diagnosis not present

## 2017-09-07 DIAGNOSIS — L97401 Non-pressure chronic ulcer of unspecified heel and midfoot limited to breakdown of skin: Secondary | ICD-10-CM | POA: Diagnosis not present

## 2017-09-09 ENCOUNTER — Telehealth: Payer: Self-pay

## 2017-09-09 NOTE — Telephone Encounter (Signed)
-----   Message from Roney Jaffe, RN sent at 09/06/2017  3:38 PM EDT ----- Patient called wanting to know if his xrays from [facility] have been received

## 2017-09-09 NOTE — Telephone Encounter (Signed)
I spoke with patient, he reported that he went to [facility] over the weekend and he was treated there.  He stated not to worry about getting xrays from [facility] now

## 2017-09-17 ENCOUNTER — Other Ambulatory Visit: Payer: Self-pay | Admitting: Family Medicine

## 2017-09-17 DIAGNOSIS — L905 Scar conditions and fibrosis of skin: Secondary | ICD-10-CM

## 2017-09-17 DIAGNOSIS — R52 Pain, unspecified: Secondary | ICD-10-CM

## 2017-09-17 DIAGNOSIS — M79604 Pain in right leg: Secondary | ICD-10-CM

## 2017-09-17 NOTE — Telephone Encounter (Signed)
Pt contacted office for refill request on the following medications:  oxyCODONE (OXY IR/ROXICODONE) 5 MG immediate release tablet  [facility] [address]  Last Rx: 08/22/17 LOV: 07/03/17 NOV: 01/02/18 Pt stated he isn't out of the medication but he knows he is supposed to call in the request a couple days before he is out of the medication. Please advise. Thanks TNP

## 2017-09-18 MED ORDER — OXYCODONE HCL 5 MG PO TABS: 5.0000 mg | ORAL_TABLET | Freq: Four times a day (QID) | ORAL | 0 refills | Status: DC | PRN

## 2017-09-19 DIAGNOSIS — N2581 Secondary hyperparathyroidism of renal origin: Secondary | ICD-10-CM | POA: Diagnosis not present

## 2017-09-19 DIAGNOSIS — Z940 Kidney transplant status: Secondary | ICD-10-CM | POA: Diagnosis not present

## 2017-09-19 DIAGNOSIS — N186 End stage renal disease: Secondary | ICD-10-CM | POA: Diagnosis not present

## 2017-09-19 DIAGNOSIS — R809 Proteinuria, unspecified: Secondary | ICD-10-CM | POA: Diagnosis not present

## 2017-09-19 DIAGNOSIS — I10 Essential (primary) hypertension: Secondary | ICD-10-CM | POA: Diagnosis not present

## 2017-10-09 ENCOUNTER — Other Ambulatory Visit: Payer: Self-pay | Admitting: Family Medicine

## 2017-10-14 ENCOUNTER — Other Ambulatory Visit: Payer: Self-pay | Admitting: Family Medicine

## 2017-10-14 MED ORDER — ALPRAZOLAM 2 MG PO TABS: ORAL_TABLET | ORAL | 3 refills | Status: DC

## 2017-10-14 NOTE — Telephone Encounter (Signed)
Pt contacted office for refill request on the following medications:  alprazolam (XANAX) 2 MG tablet   [facility] [address]  Last Rx: 04/01/17 with 5 refills LOV: 07/03/17 NOV: 01/02/18 Please change pharmacy to [facility]. Please advise. Thanks TNP

## 2017-10-16 ENCOUNTER — Ambulatory Visit: Payer: Medicare Other | Admitting: Infectious Disease

## 2017-10-16 ENCOUNTER — Ambulatory Visit (INDEPENDENT_AMBULATORY_CARE_PROVIDER_SITE_OTHER): Payer: Medicare Other | Admitting: Infectious Diseases

## 2017-10-16 ENCOUNTER — Encounter: Payer: Self-pay | Admitting: Infectious Diseases

## 2017-10-16 ENCOUNTER — Other Ambulatory Visit: Payer: Self-pay | Admitting: Family Medicine

## 2017-10-16 VITALS — BP 144/84 | HR 98 | Temp 97.7°F | Ht 71.0 in | Wt 236.0 lb

## 2017-10-16 DIAGNOSIS — I10 Essential (primary) hypertension: Secondary | ICD-10-CM | POA: Diagnosis not present

## 2017-10-16 DIAGNOSIS — L905 Scar conditions and fibrosis of skin: Secondary | ICD-10-CM

## 2017-10-16 DIAGNOSIS — L97419 Non-pressure chronic ulcer of right heel and midfoot with unspecified severity: Secondary | ICD-10-CM | POA: Diagnosis not present

## 2017-10-16 DIAGNOSIS — R52 Pain, unspecified: Secondary | ICD-10-CM

## 2017-10-16 DIAGNOSIS — M79604 Pain in right leg: Secondary | ICD-10-CM

## 2017-10-16 DIAGNOSIS — E1165 Type 2 diabetes mellitus with hyperglycemia: Secondary | ICD-10-CM | POA: Diagnosis not present

## 2017-10-16 DIAGNOSIS — E785 Hyperlipidemia, unspecified: Secondary | ICD-10-CM | POA: Diagnosis not present

## 2017-10-16 DIAGNOSIS — N2581 Secondary hyperparathyroidism of renal origin: Secondary | ICD-10-CM | POA: Diagnosis not present

## 2017-10-16 MED ORDER — OXYCODONE HCL 5 MG PO TABS: 5.0000 mg | ORAL_TABLET | Freq: Four times a day (QID) | ORAL | 0 refills | Status: DC | PRN

## 2017-10-16 NOTE — Progress Notes (Signed)
Patient: James Moreno  DOB: Mar 29, 1965 MRN: [id number] PCP: James Sons, MD  Referring Provider: Dr. Holley Moreno, [facility]    Patient Active Problem List   Diagnosis Date Noted  . Chronic ulcer of heel, right, with unspecified severity ([place]) 10/16/2017  . Hepatitis B core antibody positive 07/03/2017  . Hypertriglyceridemia 07/03/2017  . Chronic, continuous use of opioids 03/28/2016  . Anxiety 03/28/2016  . Pain in surgical scar 11/07/2015  . Bulging eyes 01/24/2015  . Leg mass 01/24/2015  . Renal transplant, status post 01/24/2015  . Carotid arterial disease ([place]) 12/27/2014  . Compulsive tobacco user syndrome 12/27/2014  . Abnormal EKG 04/06/2013  . Erectile dysfunction 11/29/2012  . Obesity 11/28/2012  . Type 2 diabetes mellitus with diabetic nephropathy ([place]) 11/28/2012  . Hypertension 12/21/2011  . Hyperlipidemia 12/21/2011  . Obstructive apnea 01/09/2011  . History of other malignant neoplasm of skin 07/16/2006  . Glaucoma 01/13/2006  . Episodic paroxysmal anxiety disorder 03/26/1998     Subjective:  James Moreno is a 52 y.o. male with past medical history of renal transplantation 07/24/14, HTN, DM, prior Hep B infection, hypertriglyceridemia, secondary hyperparathyroidism. He has a history of (+) PPD s/p tx with INH/RIF.   He was referred today for assistance with a small right foot ulcer by his nephrologist Dr. Holley Moreno. James Moreno tells me today that his has been ongoing for nearly 2 months now. Initially injury happened in the shower at the end of May when he was using a pedicure tool to get some dry skin off his heels. This started with a crack on the right outer aspect of the heel and progressed to a "black dot, patchy redness/purple, tenderness and eventually an ulceration with open skin on the heel." He was initially given Bactrim and Keflex (to which he tells me keflex does not do well with him). Was seen by podiatrist and apparently there was a  blood blister that he lanced which progressed to the ulcer from what he states. There was drainage after that and some streaking that concerned he and ortho team at 1 week evaluation; instructed to go to [facility] ED for evaluation and apparently ID was consulted and saw him there. They recommended clindamycin and ciprofloxacin x 14d. X-rays w/o concern for osteomyelitis. He has had his clindamycin refilled for nearly a month now and decided to stop taking it recently as he felt is was not doing anything and it should have by now. He felt he had the most improvement on the ciprofloxacin but also stopped using antibiotic ointment to the ulcer at that time and "let it get some air." He is very worried about flesh eating bacteria. He has had no fevers, chills or night sweats. No worsened heel pain however there is more tenderness that is extending up his lateral malleolus and now a small black dot is present there.   He is a smoker and has tried to quit several times. Recent A1C 7.4% or so. He has had 30# of weight gain since transplant and is concerned about this. Current immunosuppression includes mycophenolate and tacrolimus.   Review of Systems  All other systems reviewed and are negative.   Past Medical History:  Diagnosis Date  . Acute kidney failure, unspecified ([place])   . GERD (gastroesophageal reflux disease)   . History of hepatitis B   . Hypothyroidism   . Mitral valve disorders(424.0)   . Pityriasis 12/27/2014  . Primary pulmonary HTN ([place])   . Tricuspid  valve disorders, specified as nonrheumatic     Outpatient Medications Prior to Visit  Medication Sig Dispense Refill  . alprazolam (XANAX) 2 MG tablet Take 1/2 to 1 Tablet 3 Times Daily As Needed 90 tablet 3  . amLODipine (NORVASC) 10 MG tablet Take 5 mg by mouth daily.    . Blood Glucose Monitoring Suppl (GLUCOCOM BLOOD GLUCOSE MONITOR) DEVI Frequency:ONCE   Dosage:0.0     Instructions:  Note:Dose: N/A    . carvedilol (COREG) 6.25 MG  tablet TAKE 1 TABLET TWICE A DAY WITH MEALS 180 tablet 3  . cinacalcet (SENSIPAR) 30 MG tablet Take 30 mg by mouth daily.    . ciprofloxacin (CIPRO) 500 MG tablet Take 500 mg by mouth 2 (two) times daily.    . clindamycin (CLEOCIN) 300 MG capsule Take 300 mg by mouth 3 (three) times daily.  0  . fenofibrate (TRICOR) 145 MG tablet Take 145 mg by mouth daily.    . fenofibrate (TRICOR) 145 MG tablet Take 145 mg by mouth daily.    . insulin aspart (NOVOLOG) 100 UNIT/ML injection Inject 10 Units into the skin 3 (three) times daily before meals. Sliding scale    . Insulin Degludec (TRESIBA FLEXTOUCH [place]) Inject 30 Units into the skin daily.     Marland Kitchen losartan (COZAAR) 50 MG tablet   11  . lovastatin (MEVACOR) 40 MG tablet Take 40 mg by mouth at bedtime.    . mycophenolate (MYFORTIC) 180 MG EC tablet Take 180 mg by mouth 2 (two) times daily.    Marland Kitchen omeprazole (PRILOSEC) 20 MG capsule Take 20 mg by mouth daily.    . sildenafil (VIAGRA) 100 MG tablet Take 0.5-1 tablets (50-100 mg total) daily as needed by mouth for erectile dysfunction. 30 tablet 5  . tacrolimus (PROGRAF) 1 MG capsule Takes 5 tablets every morning and 4 tablets every evening  11  . VIAGRA 100 MG tablet TAKE ONE-HALF (1/2) TO ONE TABLET DAILY AS NEEDED FOR ERECTILE DYSFUNCTION 30 tablet 5  . oxyCODONE (OXY IR/ROXICODONE) 5 MG immediate release tablet Take 1-2 tablets (5-10 mg total) by mouth every 6 (six) hours as needed for severe pain. 240 tablet 0  . cephALEXin (KEFLEX) 500 MG capsule TAKE 1 CAPSULE BY MOUTH EVERY 6 HOURS FOR 10 DAYS  0   No facility-administered medications prior to visit.      No Known Allergies  Social History   Tobacco Use  . Smoking status: Current Some Day Smoker    Packs/day: 0.50    Years: 31.00    Pack years: 15.50    Types: Cigarettes  . Smokeless tobacco: Former Systems developer    Quit date: 11/26/2011  . Tobacco comment: Quit 3 months ago  Substance Use Topics  . Alcohol use: No    Alcohol/week: 0.0 oz     Comment: Excessive alcohol consumption in the past. Quit around 2016  . Drug use: No    Family History  Problem Relation Age of Onset  . Hypertension Mother   . Hyperlipidemia Mother   . Melanoma Father     Objective:   Vitals:   10/16/17 1019  BP: (!) 144/84  Pulse: 98  Temp: 97.7 F (36.5 C)  TempSrc: Oral  Weight: 236 lb (107 kg)  Height: 5\' 11"  (1.803 m)   Body mass index is 32.92 kg/m.  Physical Exam  Constitutional: He is oriented to person, place, and time. He appears well-developed and well-nourished.  Seated comfortably in chair during visit. Concerned and  anxious. Well-groomed and appropriately dressed.   HENT:  Mouth/Throat: Oropharynx is clear and moist and mucous membranes are normal. Normal dentition. No dental abscesses.  Cardiovascular: Normal rate, regular rhythm and normal heart sounds.  Pulmonary/Chest: Effort normal and breath sounds normal.  Abdominal: Soft. He exhibits no distension. There is no tenderness.  Musculoskeletal:  Right heel pictured below - small 0.5 x 1 x 0.1 cm ulcer present. Periwound with some small purple discoloration at 10 o'clock position. No drainage or pain over ulcer or to calcaneus.  Right lateral malleolus - irregular patches of purple/red areas that are tender to the touch. Small < 0.2 cm black dot noted. No crepitus appreciated. Non-blanching. Mottled-appearing.   Lymphadenopathy:    He has no cervical adenopathy.  Neurological: He is alert and oriented to person, place, and time.  Skin: Skin is warm and dry. No rash noted.  Psychiatric: He has a normal mood and affect. Judgment normal.  In good spirits today and engaged in care discussion.    Right Foot - 09/09/17 per his phone          Lab Results: Lab Results  Component Value Date   WBC 8.9 10/21/2015   HGB 16.3 10/21/2015   HCT 46.7 10/21/2015   MCV 92.6 10/21/2015   PLT 140 (L) 10/21/2015    Lab Results  Component Value Date   CREATININE 2.18 (H)  10/21/2015   BUN 34 (H) 10/21/2015   NA 136 10/21/2015   K 5.3 (H) 10/21/2015   CL 106 10/21/2015   CO2 24 10/21/2015    Lab Results  Component Value Date   ALT 32 10/21/2015   AST 21 10/21/2015   ALKPHOS 188 (H) 10/21/2015   BILITOT 0.8 10/21/2015     Assessment & Plan:   Problem List Items Addressed This Visit      Musculoskeletal and Integument   Chronic ulcer of heel, right, with unspecified severity ([place]) - Primary    Mr. Kilgore is a very nice gentleman whom has had trouble with small persistent ulceration to the right heel following injury in the shower from pedicure tool at home. He has had prolonged antibiotic therapy with clindamycin, cipro and keflex/bactrim and has had a fairly broad span of coverage. He does have some atypical features with pain overlying non-blanchable areas and under-whelming response to what I would consider appropriate therapy that would include most suspect organisms.   Seems that [facility] was trying to arrange for him to see transplant ID team however that visit did not happen and it has now been 1 month (not sure if something is scheduled or not). He is very panicked that it is flesh eating bacteria which I feel is less likely considering this has been ongoing for 2 months now and he is not systemically ill. Previous x-rays reviewed and no evidence of osteomyelitis or gas. I would feel more comfortably he be seen by ID transplant to ensure nothing is missed considering atypical organism suspicion; his kidney function has been unchanged from his report so hopefully not a vasculitis-picture as I am concerned about the non-blanchable red/purple skin discoloration with pain. Black spot foreign body?   We discussed getting further imaging of the heel considering pain and chronicity over bony prominence. He would like to proceed with MRI of the right heel/ankle but ultimately I feel that tissue biopsy with culture may be what is the next step.I asked him to please  stop all antibiotics for now and closely monitor for any  changes/progressions and asked to upload pictures to MyChart for review should something change while we get our plan together. I will also place referral to Wound Care per his request to see if they can help with recommendations. I asked him to please stop picking/digging at his skin and to please defer putting anything topically on this until seen by wound care team on Aug 1st.   Will follow up on imaging/referrals to determine how I can assist with next steps for him.       Relevant Orders   AMB referral to wound care center   MR Colleton, MSN, NP-C [facility] Pager: [phone] Office: [phone]  10/16/17  10:55 PM

## 2017-10-16 NOTE — Assessment & Plan Note (Addendum)
James Moreno is a very nice gentleman whom has had trouble with small persistent ulceration to the right heel following injury in the shower from pedicure tool at home. He has had prolonged antibiotic therapy with clindamycin, cipro and keflex/bactrim and has had a fairly broad span of coverage. He does have some atypical features with pain overlying non-blanchable areas and under-whelming response to what I would consider appropriate therapy that would include most suspect organisms.   Seems that [facility] was trying to arrange for him to see transplant ID team however that visit did not happen and it has now been 1 month (not sure if something is scheduled or not). He is very panicked that it is flesh eating bacteria which I feel is less likely considering this has been ongoing for 2 months now and he is not systemically ill. Previous x-rays reviewed and no evidence of osteomyelitis or gas. I would feel more comfortably he be seen by ID transplant to ensure nothing is missed considering atypical organism suspicion; his kidney function has been unchanged from his report so hopefully not a vasculitis-picture as I am concerned about the non-blanchable red/purple skin discoloration with pain. Black spot foreign body?   We discussed getting further imaging of the heel considering pain and chronicity over bony prominence. He would like to proceed with MRI of the right heel/ankle but ultimately I feel that tissue biopsy with culture may be what is the next step.I asked him to please stop all antibiotics for now and closely monitor for any changes/progressions and asked to upload pictures to MyChart for review should something change while we get our plan together. I will also place referral to Wound Care per his request to see if they can help with recommendations. I asked him to please stop picking/digging at his skin and to please defer putting anything topically on this until seen by wound care team on Aug 1st.   Will  follow up on imaging/referrals to determine how I can assist with next steps for him.

## 2017-10-16 NOTE — Patient Instructions (Addendum)
Stop all antibiotics. From an infection stand point we need more information so we can see if antibiotics are needed to help clear this up for you versus something else.   Will get you in to see wound care team first available preferably at [facility]   May need to consider a tissue biopsy of the redness that has spread to get an idea as to what is going on.   Please call back after wound care team sees you so we can discuss again and see what we need to do for you.

## 2017-10-16 NOTE — Telephone Encounter (Signed)
Pt contacted office for refill request on the following medications:  oxyCODONE (OXY IR/ROXICODONE) 5 MG immediate release tablet  [facility] [address]  Last Rx: 09/18/17 LOV: 07/03/17 Please advise. Thanks TNP

## 2017-10-17 DIAGNOSIS — B351 Tinea unguium: Secondary | ICD-10-CM | POA: Diagnosis not present

## 2017-10-17 DIAGNOSIS — L600 Ingrowing nail: Secondary | ICD-10-CM | POA: Diagnosis not present

## 2017-10-17 DIAGNOSIS — E1159 Type 2 diabetes mellitus with other circulatory complications: Secondary | ICD-10-CM | POA: Diagnosis not present

## 2017-10-17 DIAGNOSIS — L97411 Non-pressure chronic ulcer of right heel and midfoot limited to breakdown of skin: Secondary | ICD-10-CM | POA: Diagnosis not present

## 2017-10-18 ENCOUNTER — Telehealth: Payer: Self-pay | Admitting: *Deleted

## 2017-10-18 NOTE — Telephone Encounter (Signed)
-----   Message from Yarnell Callas, NP sent at 10/17/2017  9:03 AM EDT ----- Going to do MRI instead of CT scan to get a better view at the soft tissue specifically. I see where [facility] was trying to schedule with him. Can you please call him tomorrow to ask if he followed up on this appointment? I do think it would be good to get a transplant ID perspective while we continue to gather more data through MRI and possibly tissue biopsy. Thank you!

## 2017-10-18 NOTE — Telephone Encounter (Signed)
Per message from [place] called the patient to advise of change from CT to MRI and to ask if he had contacted [facility] about appointment and was not able to get on phone. Line just rang and no voicemail set up.

## 2017-10-22 ENCOUNTER — Telehealth: Payer: Self-pay | Admitting: Family Medicine

## 2017-10-22 DIAGNOSIS — E118 Type 2 diabetes mellitus with unspecified complications: Secondary | ICD-10-CM | POA: Diagnosis not present

## 2017-10-22 DIAGNOSIS — E1121 Type 2 diabetes mellitus with diabetic nephropathy: Secondary | ICD-10-CM | POA: Diagnosis not present

## 2017-10-22 DIAGNOSIS — E785 Hyperlipidemia, unspecified: Secondary | ICD-10-CM | POA: Diagnosis not present

## 2017-10-22 DIAGNOSIS — E1165 Type 2 diabetes mellitus with hyperglycemia: Secondary | ICD-10-CM | POA: Diagnosis not present

## 2017-10-22 DIAGNOSIS — N2581 Secondary hyperparathyroidism of renal origin: Secondary | ICD-10-CM | POA: Diagnosis not present

## 2017-10-22 DIAGNOSIS — E08311 Diabetes mellitus due to underlying condition with unspecified diabetic retinopathy with macular edema: Secondary | ICD-10-CM | POA: Diagnosis not present

## 2017-10-22 DIAGNOSIS — E0859 Diabetes mellitus due to underlying condition with other circulatory complications: Secondary | ICD-10-CM | POA: Diagnosis not present

## 2017-10-22 DIAGNOSIS — I10 Essential (primary) hypertension: Secondary | ICD-10-CM | POA: Diagnosis not present

## 2017-10-22 DIAGNOSIS — I7389 Other specified peripheral vascular diseases: Secondary | ICD-10-CM | POA: Diagnosis not present

## 2017-10-22 DIAGNOSIS — N189 Chronic kidney disease, unspecified: Secondary | ICD-10-CM | POA: Diagnosis not present

## 2017-10-22 NOTE — Telephone Encounter (Signed)
Please advise 

## 2017-10-22 NOTE — Telephone Encounter (Signed)
Pt stated that he was referred to [facility] years ago for low testosterone but hasn't been in their office in a couple years. Pt stated they are requesting a referral from PCP before they will schedule pt. Pt is requesting Dr. Caryn Section send them a referral. Please advise. Thanks TNP

## 2017-10-23 NOTE — Telephone Encounter (Signed)
LMTCB. Patient needs to schedule a appointment for documentation and labs before referral can be ordered.

## 2017-10-23 NOTE — Telephone Encounter (Signed)
Referral requires current documentation of medical necessity. Need to schedule o.v. And have labs done.

## 2017-10-24 ENCOUNTER — Encounter: Payer: Medicare Other | Attending: Nurse Practitioner | Admitting: Nurse Practitioner

## 2017-10-24 DIAGNOSIS — Z794 Long term (current) use of insulin: Secondary | ICD-10-CM | POA: Insufficient documentation

## 2017-10-24 DIAGNOSIS — F17210 Nicotine dependence, cigarettes, uncomplicated: Secondary | ICD-10-CM | POA: Insufficient documentation

## 2017-10-24 DIAGNOSIS — Z940 Kidney transplant status: Secondary | ICD-10-CM | POA: Diagnosis not present

## 2017-10-24 DIAGNOSIS — E11621 Type 2 diabetes mellitus with foot ulcer: Secondary | ICD-10-CM | POA: Diagnosis not present

## 2017-10-24 DIAGNOSIS — I10 Essential (primary) hypertension: Secondary | ICD-10-CM | POA: Diagnosis not present

## 2017-10-24 DIAGNOSIS — L97411 Non-pressure chronic ulcer of right heel and midfoot limited to breakdown of skin: Secondary | ICD-10-CM | POA: Insufficient documentation

## 2017-10-24 NOTE — Telephone Encounter (Signed)
LMOVM for pt to return call 

## 2017-10-25 ENCOUNTER — Telehealth: Payer: Self-pay | Admitting: Behavioral Health

## 2017-10-25 NOTE — Telephone Encounter (Signed)
Patient called and states he has gone to [facility] wound care and he has gone to see his Podiatrist Dr. Cleda Mccreedy.  He wanted to make Janene Madeira aware.  Per patient he states both thought it was a circulation problem and not and infection.  Patient states he will follow up with wound care weekly.  [facility] to fax over office visit notes.  Also patient was given his MRI appointment date and time.  Patient to arrive at [facility] 11/01/2017 at 7:45am for a 8:00am appointment.  Patient verbalized understanding. Pricilla Riffle RN

## 2017-10-29 ENCOUNTER — Other Ambulatory Visit (INDEPENDENT_AMBULATORY_CARE_PROVIDER_SITE_OTHER): Payer: Self-pay | Admitting: Vascular Surgery

## 2017-10-29 ENCOUNTER — Other Ambulatory Visit (INDEPENDENT_AMBULATORY_CARE_PROVIDER_SITE_OTHER): Payer: Self-pay | Admitting: Podiatry

## 2017-10-29 DIAGNOSIS — I739 Peripheral vascular disease, unspecified: Secondary | ICD-10-CM

## 2017-10-30 ENCOUNTER — Encounter (INDEPENDENT_AMBULATORY_CARE_PROVIDER_SITE_OTHER): Payer: Self-pay

## 2017-10-30 ENCOUNTER — Ambulatory Visit (INDEPENDENT_AMBULATORY_CARE_PROVIDER_SITE_OTHER): Payer: Medicare Other | Admitting: Nurse Practitioner

## 2017-10-30 ENCOUNTER — Ambulatory Visit (INDEPENDENT_AMBULATORY_CARE_PROVIDER_SITE_OTHER): Payer: Medicare Other

## 2017-10-30 ENCOUNTER — Encounter (INDEPENDENT_AMBULATORY_CARE_PROVIDER_SITE_OTHER): Payer: Self-pay | Admitting: Vascular Surgery

## 2017-10-30 VITALS — BP 132/78 | HR 86 | Resp 16 | Ht 71.0 in | Wt 237.8 lb

## 2017-10-30 DIAGNOSIS — I70299 Other atherosclerosis of native arteries of extremities, unspecified extremity: Secondary | ICD-10-CM

## 2017-10-30 DIAGNOSIS — L97909 Non-pressure chronic ulcer of unspecified part of unspecified lower leg with unspecified severity: Secondary | ICD-10-CM | POA: Diagnosis not present

## 2017-10-30 DIAGNOSIS — I739 Peripheral vascular disease, unspecified: Secondary | ICD-10-CM

## 2017-10-30 DIAGNOSIS — I70234 Atherosclerosis of native arteries of right leg with ulceration of heel and midfoot: Secondary | ICD-10-CM

## 2017-10-30 DIAGNOSIS — Z940 Kidney transplant status: Secondary | ICD-10-CM

## 2017-10-30 DIAGNOSIS — E1121 Type 2 diabetes mellitus with diabetic nephropathy: Secondary | ICD-10-CM | POA: Diagnosis not present

## 2017-10-30 DIAGNOSIS — L97419 Non-pressure chronic ulcer of right heel and midfoot with unspecified severity: Secondary | ICD-10-CM | POA: Diagnosis not present

## 2017-10-30 NOTE — Patient Instructions (Signed)
Endovascular Therapy for Peripheral Arterial Disease Peripheral arterial disease (PAD) is a condition in which the arteries that supply blood to the arms and legs (limbs) are too narrow. The narrowing is due to plaque buildup (atherosclerosis). Plaque is made up of fat, cholesterol, calcium, or other substances that can build up inside blood vessels. PAD can cause pain and numbness due to lack of blood flow, particularly in the legs. Endovascular therapy is a procedure to widen a narrowed blood vessel and improve blood flow. Endovascular means the procedure is done inside your artery, using a long, thin tube (catheter). The catheter is inserted into an incision in your leg and moved up your artery until it reaches the narrow part. A balloon or a small metal tube (stent) may be used to help widen the narrow artery and keep it open. Your health care provider may recommend endovascular therapy if lifestyle changes and medicines are not enough to improve your PAD. In some cases-such as when more than one artery is affected-you may need more than one procedure. Tell a health care provider about:  Any allergies you have.  All medicines you are taking, including vitamins, herbs, eye drops, creams, and over-the-counter medicines.  Any problems you or family members have had with anesthetic medicines.  Any blood disorders you have.  Any surgeries you have had.  Any medical conditions you have.  Whether you are pregnant or may be pregnant. What are the risks? Generally, this is a safe procedure. However, problems may occur, including:  Infection.  Bleeding.  Allergic reactions to medicines, materials, or dyes.  Damage to other structures or organs.  Heart attack.  Stroke.  Blood clots.  Kidney problems.  Nerve damage.  The stent moving out of place, becoming blocked, or not working.  Loss of your affected arm or leg.  What happens before the procedure? Medicines  Ask your health  care provider about: ? Changing or stopping your regular medicines. This is especially important if you are taking diabetes medicines or blood thinners. ? Taking medicines such as aspirin and ibuprofen. These medicines can thin your blood. Do not take these medicines unless your health care provider tells you to take them. ? Taking over-the-counter medicines, vitamins, herbs, and supplements.  You may be given antibiotic medicine to help prevent infection. General instructions  Do not use any products that contain nicotine or tobacco, such as cigarettes and e-cigarettes. If you need help quitting, ask your health care provider.  Follow instructions from your health care provider about eating or drinking restrictions.  You will have blood tests and a physical exam. You may have other tests, such as: ? Ankle-brachial index (ABI). This test compares blood pressure in your ankle and arm. This can indicate narrowing or blockage in your leg arteries. ? Doppler ultrasound. This test uses sound waves to check blood flow. ? CT scan. This test uses dye to check blood flow and blockages in your leg arteries. ? MRI. ? Electrocardiogram (ECG) to check the electrical patterns and rhythms of the heart.  You may be asked to shower with a germ-killing soap.  Ask your health care provider how your surgical site will be marked or identified.  Plan to have someone take you home from the hospital. What happens during the procedure?  To lower your risk of infection: ? Your health care team will wash or sanitize their hands. ? Hair may be removed from the surgical area. ? Your skin will be washed with soap.  An IV will be inserted into one of your veins.  You will be given one or more of the following: ? A medicine to help you relax (sedative). ? A medicine to numb the area for the procedure (local anesthetic).  A puncture will be made in your upper thigh area, in the femoral artery or the iliac  artery. Rarely, a puncture may be made in the ankle area. A small incision may be made instead of a puncture.  A small wire will be inserted into the artery and moved through the artery.  Dye will be injected into your artery, and X-rays will be used to help identify where the blockage is. The dye helps to make the blood flow visible on X-rays.  A catheter will be inserted into the same spot as the small wire. It will be moved up the artery to reach the blocked or narrow part.  A small, deflated balloon will be inserted over the wire and into the catheter. It will be moved up the artery to reach the blocked or narrow part.  The small balloon will be filled with air (inflated) to widen the narrow part of the artery.  The balloon will be deflated.  A stent may be placed in the widened part of the artery to keep the artery open.  The wire and catheter will be removed.  A small catheter (urinary catheter) may be placed to drain urine from your bladder. This catheter may be used temporarily to drain urine during or after the procedure.  Your puncture or incision may be closed with a stitch (suture) or skin glue.  Your puncture or incision may be covered with a bandage (dressing). The procedure may vary among health care providers and hospitals. What happens after the procedure?  Your blood pressure, heart rate, breathing rate, and blood oxygen level will be monitored until the medicines you were given have worn off.  You will need to stay in bed as directed.  You may continue to have a catheter draining your urine.  You will be encouraged to drink fluids to wash (flush) the dye out of your body.  You will be given pain medicine as needed.  If you were given a sedative, do not drive for 24 hours or until your health care provider says that driving is safe for you. Summary  Endovascular therapy is a procedure to widen a narrowed blood vessel and improve blood flow.  This procedure  may be recommended if lifestyle changes and medicines are not enough to improve your peripheral arterial disease (PAD).  After the procedure, you will need to stay in bed and you will be encouraged to drink fluids to wash (flush) dye out of your body. This information is not intended to replace advice given to you by your health care provider. Make sure you discuss any questions you have with your health care provider. Document Released: 07/05/2016 Document Revised: 07/05/2016 Document Reviewed: 07/05/2016 Elsevier Interactive Patient Education  [employer].

## 2017-10-30 NOTE — Telephone Encounter (Signed)
Patient stated he has already "taken care of this" and no longer needs [facility] to do a referral.

## 2017-10-30 NOTE — Progress Notes (Signed)
Subjective:    Patient ID: James Moreno, male    DOB: 1965/09/11, 52 y.o.   MRN: [id number] Chief Complaint  Patient presents with  . New Patient (Initial Visit)    ref cline for ABI    HPI  James Moreno, referred by Dr. Cleda Mccreedy, is seen for evaluation of painful lower extremities and diminished pulses associated with ulceration of the foot.  The patient notes the ulcer has been present for multiple weeks and has just started to heel somewhat in the past week per the patient .  It is very painful and has had some drainage.  No specific history of trauma noted by the patient.  The patient denies fever or chills.  the patient does have diabetes which has been difficult to control.  The patient also has a history of prior renal transplant.    Patient notes prior to the ulcer developing the extremities were painful particularly with ambulation or activity and the discomfort is very consistent. Typically, the pain occurs at less than one block, progress is as activity continues to the point that the patient must stop walking. Resting including standing still for several minutes allowed resumption of the activity and the ability to walk a similar distance before stopping again. Uneven terrain and inclined shorten the distance. The pain has been progressive over the past several years.   The patient denies rest pain or dangling of an extremity off the side of the bed during the night for relief. No prior interventions or surgeries for PAD.  No history of back problems or DJD of the lumbar sacral spine.   Patient had ABIs done in the office today the right ABI measures 1.37 with a TBI of 0.48 the left ABI is 1.06 with a TBI of 0.303 right has biphasic waveforms wheras the left displays monophasic waveforms.  However, the final interpretation notes that the ABIs may be unreliable due to medial calcification.  The patient denies amaurosis fugax or recent TIA symptoms. There are no recent neurological  changes noted. The patient denies history of DVT, PE or superficial thrombophlebitis. The patient denies recent episodes of angina or shortness of breath.   Constitutional: [] Weight loss  [] Fever  [] Chills Weight Gain  Cardiac: [] Chest pain   [] Chest pressure   [] Palpitations   [] Shortness of breath when laying flat   [] Shortness of breath with exertion. Vascular:  [x] Pain in legs with walking   [] Pain in legs with standing  [] History of DVT   [] Phlebitis   [] Swelling in legs   [] Varicose veins   [x] Non-healing ulcers Pulmonary:   [] Uses home oxygen   [] Productive cough   [] Hemoptysis   [] Wheeze  [] COPD   [] Asthma Neurologic:  [] Dizziness   [] Seizures   [] History of stroke   [] History of TIA  [] Aphasia   [] Vissual changes   [] Weakness or numbness in arm   [] Weakness or numbness in leg Musculoskeletal:   [] Joint swelling   [] Joint pain   [] Low back pain Hematologic:  [] Easy bruising  [] Easy bleeding   [] Hypercoagulable state   [] Anemic Gastrointestinal:  [] Diarrhea   [] Vomiting  [] Gastroesophageal reflux/heartburn   [] Difficulty swallowing. Genitourinary:  [x] Chronic kidney disease   [] Difficult urination  [] Frequent urination   [] Blood in urine Skin:  [] Rashes   [] Ulcers  Psychological:  [] History of anxiety   []  History of major depression.     Objective:   Physical Exam   BP 132/78 (BP Location: Right Arm)   Pulse 86  Resp 16   Ht 5\' 11"  (1.803 m)   Wt 237 lb 12.8 oz (107.9 kg)   BMI 33.17 kg/m      Past Medical History:  Diagnosis Date  . Acute kidney failure, unspecified ([place])   . GERD (gastroesophageal reflux disease)   . History of hepatitis B   . Hypothyroidism   . Mitral valve disorders(424.0)   . Pityriasis 12/27/2014  . Primary pulmonary HTN ([place])   . Tricuspid valve disorders, specified as nonrheumatic      Gen: WD/WN, NAD Head: [place]/AT, No temporalis wasting.  Ear/Nose/Throat: Hearing grossly intact, nares w/o erythema or drainage, poor dentition,  Eyes: PER,  EOMI, erythematous sclera Neck: Supple, no masses.  No bruit or JVD.  Pulmonary:  Good air movement, clear to auscultation bilaterally, no use of accessory muscles.  Cardiac: RRR, normal S1, S2, no Murmurs. Vascular: Vessel Right Left  PT absent absent  DP diminished Diminished   Carotid    Gastrointestinal: soft, non-distended. No guarding/no peritoneal signs.  Musculoskeletal: M/S 5/5 throughout.  No deformity or atrophy.  Neurologic: CN 2-12 intact. Pain and light touch intact in extremities.  Symmetrical.  Speech is fluent. Motor exam as listed above. Psychiatric: Judgment intact, Mood & affect appropriate for pt's clinical situation. Dermatologic: Venous rashes no ulcers noted.  No changes consistent with cellulitis. Lymph : No Cervical lymphadenopathy, no lichenification or skin changes of chronic lymphedema.   Social History   Socioeconomic History  . Marital status: Married    Spouse name: Not on file  . Number of children: 3  . Years of education: Not on file  . Highest education level: Not on file  Occupational History  . Occupation: Insurance account manager  Social Needs  . Financial resource strain: Not on file  . Food insecurity:    Worry: Not on file    Inability: Not on file  . Transportation needs:    Medical: Not on file    Non-medical: Not on file  Tobacco Use  . Smoking status: Current Some Day Smoker    Packs/day: 0.50    Years: 31.00    Pack years: 15.50    Types: Cigarettes  . Smokeless tobacco: Former Systems developer    Quit date: 11/26/2011  . Tobacco comment: Quit 3 months ago  Substance and Sexual Activity  . Alcohol use: No    Alcohol/week: 0.0 oz    Comment: Excessive alcohol consumption in the past. Quit around 2016  . Drug use: No  . Sexual activity: Not on file  Lifestyle  . Physical activity:    Days per week: Not on file    Minutes per session: Not on file  . Stress: Not on file  Relationships  . Social connections:    Talks on phone: Not on file     Gets together: Not on file    Attends religious service: Not on file    Active member of club or organization: Not on file    Attends meetings of clubs or organizations: Not on file    Relationship status: Not on file  . Intimate partner violence:    Fear of current or ex partner: Not on file    Emotionally abused: Not on file    Physically abused: Not on file    Forced sexual activity: Not on file  Other Topics Concern  . Not on file  Social History Narrative  . Not on file    Past Surgical History:  Procedure Laterality Date  .  APPENDECTOMY    . Carotid Doppler Ultrasound  06/14/2009   39% stenosis of bilateral internal carotid artery, bilateral anterograde vertebral flow  . EYE SURGERY     right  . HEMORROIDECTOMY    . KIDNEY TRANSPLANT Right 2016  . MECKEL DIVERTICULUM EXCISION     infancy  . Myocardial Perfusion scan  01/17/2009   [facility], non- ischemic. LVEF= 55%  . PARS PLANA VITRECTOMY Left 02/09/2015   Procedure: Pan retinal photocoagulation 34196;  Surgeon: Milus Height, MD;  Location: [facility] ORS;  Service: Ophthalmology;  Laterality: Left;  . REFRACTIVE SURGERY Left   . sleep study  01/09/2011   Severe sleep apnea. AHI 72.9/hr. RDI=83.0/hr. Desaturation to 69.0% Emergency CPAP titaration to 14.0cm    Family History  Problem Relation Age of Onset  . Hypertension Mother   . Hyperlipidemia Mother   . Melanoma Father     No Known Allergies     Assessment & Plan:   1. Atherosclerosis of native artery of right lower extremity with ulceration of heel (HCC)-New  Recommend:  The patient has evidence of severe atherosclerotic changes of both lower extremities associated with ulceration and tissue loss of the foot.   Patient should undergo angiography of the right lower extremity first in order to promote healing of the chronic ulcer on his heel.  Due to increasing claudication pain that the patient describes the, we will also plan to subsequently  intervene on the left.  The risks and benefits as well as the alternative therapies was discussed in detail with the patient.  All questions were answered.  Patient agrees to proceed with angiography.  The patient has also had a renal transplant.  The patient expressed concerns due to this, the patient was assured that this would be taken into consideration during the procedure.   The patient will follow up with me in the office after the procedure.    2. Chronic ulcer of heel, right, with unspecified severity ([place]) See above  3. Type 2 diabetes mellitus with diabetic nephropathy, without long-term current use of insulin (HCC) Continue hypoglycemic medications as already ordered, these medications have been reviewed and there are no changes at this time.  Hgb A1C to be monitored as already arranged by primary service Continue hypoglycemic medications as already ordered, these medications have been reviewed and there are no changes at this time.  Hgb A1C to be monitored as already arranged by primary service   4. Renal transplant, status post See above   Current Outpatient Medications on File Prior to Visit  Medication Sig Dispense Refill  . alprazolam (XANAX) 2 MG tablet Take 1/2 to 1 Tablet 3 Times Daily As Needed 90 tablet 3  . amLODipine (NORVASC) 10 MG tablet Take 5 mg by mouth daily.    . Blood Glucose Monitoring Suppl (GLUCOCOM BLOOD GLUCOSE MONITOR) DEVI Frequency:ONCE   Dosage:0.0     Instructions:  Note:Dose: N/A    . carvedilol (COREG) 6.25 MG tablet TAKE 1 TABLET TWICE A DAY WITH MEALS 180 tablet 3  . cinacalcet (SENSIPAR) 30 MG tablet Take 30 mg by mouth daily.    . fenofibrate (TRICOR) 145 MG tablet Take 145 mg by mouth daily.    . fenofibrate (TRICOR) 145 MG tablet Take 145 mg by mouth daily.    . insulin aspart (NOVOLOG) 100 UNIT/ML injection Inject 10 Units into the skin 3 (three) times daily before meals. Sliding scale    . Insulin Degludec (TRESIBA FLEXTOUCH [place])  Inject 30  Units into the skin daily.     Marland Kitchen losartan (COZAAR) 50 MG tablet   11  . lovastatin (MEVACOR) 40 MG tablet Take 40 mg by mouth at bedtime.    . mycophenolate (MYFORTIC) 180 MG EC tablet Take 180 mg by mouth 2 (two) times daily.    Marland Kitchen omeprazole (PRILOSEC) 20 MG capsule Take 20 mg by mouth daily.    Marland Kitchen oxyCODONE (OXY IR/ROXICODONE) 5 MG immediate release tablet Take 1-2 tablets (5-10 mg total) by mouth every 6 (six) hours as needed for severe pain. 240 tablet 0  . sildenafil (VIAGRA) 100 MG tablet Take 0.5-1 tablets (50-100 mg total) daily as needed by mouth for erectile dysfunction. 30 tablet 5  . tacrolimus (PROGRAF) 1 MG capsule Takes 5 tablets every morning and 4 tablets every evening  11  . VIAGRA 100 MG tablet TAKE ONE-HALF (1/2) TO ONE TABLET DAILY AS NEEDED FOR ERECTILE DYSFUNCTION 30 tablet 5  . cephALEXin (KEFLEX) 500 MG capsule TAKE 1 CAPSULE BY MOUTH EVERY 6 HOURS FOR 10 DAYS  0  . ciprofloxacin (CIPRO) 500 MG tablet Take 500 mg by mouth 2 (two) times daily.    . clindamycin (CLEOCIN) 300 MG capsule Take 300 mg by mouth 3 (three) times daily.  0   No current facility-administered medications on file prior to visit.     Patient Instructions  Endovascular Therapy for Peripheral Arterial Disease Peripheral arterial disease (PAD) is a condition in which the arteries that supply blood to the arms and legs (limbs) are too narrow. The narrowing is due to plaque buildup (atherosclerosis). Plaque is made up of fat, cholesterol, calcium, or other substances that can build up inside blood vessels. PAD can cause pain and numbness due to lack of blood flow, particularly in the legs. Endovascular therapy is a procedure to widen a narrowed blood vessel and improve blood flow. Endovascular means the procedure is done inside your artery, using a long, thin tube (catheter). The catheter is inserted into an incision in your leg and moved up your artery until it reaches the narrow part. A balloon or  a small metal tube (stent) may be used to help widen the narrow artery and keep it open. Your health care provider may recommend endovascular therapy if lifestyle changes and medicines are not enough to improve your PAD. In some cases-such as when more than one artery is affected-you may need more than one procedure. Tell a health care provider about:  Any allergies you have.  All medicines you are taking, including vitamins, herbs, eye drops, creams, and over-the-counter medicines.  Any problems you or family members have had with anesthetic medicines.  Any blood disorders you have.  Any surgeries you have had.  Any medical conditions you have.  Whether you are pregnant or may be pregnant. What are the risks? Generally, this is a safe procedure. However, problems may occur, including:  Infection.  Bleeding.  Allergic reactions to medicines, materials, or dyes.  Damage to other structures or organs.  Heart attack.  Stroke.  Blood clots.  Kidney problems.  Nerve damage.  The stent moving out of place, becoming blocked, or not working.  Loss of your affected arm or leg.  What happens before the procedure? Medicines  Ask your health care provider about: ? Changing or stopping your regular medicines. This is especially important if you are taking diabetes medicines or blood thinners. ? Taking medicines such as aspirin and ibuprofen. These medicines can thin your blood. Do not  take these medicines unless your health care provider tells you to take them. ? Taking over-the-counter medicines, vitamins, herbs, and supplements.  You may be given antibiotic medicine to help prevent infection. General instructions  Do not use any products that contain nicotine or tobacco, such as cigarettes and e-cigarettes. If you need help quitting, ask your health care provider.  Follow instructions from your health care provider about eating or drinking restrictions.  You will have  blood tests and a physical exam. You may have other tests, such as: ? Ankle-brachial index (ABI). This test compares blood pressure in your ankle and arm. This can indicate narrowing or blockage in your leg arteries. ? Doppler ultrasound. This test uses sound waves to check blood flow. ? CT scan. This test uses dye to check blood flow and blockages in your leg arteries. ? MRI. ? Electrocardiogram (ECG) to check the electrical patterns and rhythms of the heart.  You may be asked to shower with a germ-killing soap.  Ask your health care provider how your surgical site will be marked or identified.  Plan to have someone take you home from the hospital. What happens during the procedure?  To lower your risk of infection: ? Your health care team will wash or sanitize their hands. ? Hair may be removed from the surgical area. ? Your skin will be washed with soap.  An IV will be inserted into one of your veins.  You will be given one or more of the following: ? A medicine to help you relax (sedative). ? A medicine to numb the area for the procedure (local anesthetic).  A puncture will be made in your upper thigh area, in the femoral artery or the iliac artery. Rarely, a puncture may be made in the ankle area. A small incision may be made instead of a puncture.  A small wire will be inserted into the artery and moved through the artery.  Dye will be injected into your artery, and X-rays will be used to help identify where the blockage is. The dye helps to make the blood flow visible on X-rays.  A catheter will be inserted into the same spot as the small wire. It will be moved up the artery to reach the blocked or narrow part.  A small, deflated balloon will be inserted over the wire and into the catheter. It will be moved up the artery to reach the blocked or narrow part.  The small balloon will be filled with air (inflated) to widen the narrow part of the artery.  The balloon will be  deflated.  A stent may be placed in the widened part of the artery to keep the artery open.  The wire and catheter will be removed.  A small catheter (urinary catheter) may be placed to drain urine from your bladder. This catheter may be used temporarily to drain urine during or after the procedure.  Your puncture or incision may be closed with a stitch (suture) or skin glue.  Your puncture or incision may be covered with a bandage (dressing). The procedure may vary among health care providers and hospitals. What happens after the procedure?  Your blood pressure, heart rate, breathing rate, and blood oxygen level will be monitored until the medicines you were given have worn off.  You will need to stay in bed as directed.  You may continue to have a catheter draining your urine.  You will be encouraged to drink fluids to wash (flush) the dye  out of your body.  You will be given pain medicine as needed.  If you were given a sedative, do not drive for 24 hours or until your health care provider says that driving is safe for you. Summary  Endovascular therapy is a procedure to widen a narrowed blood vessel and improve blood flow.  This procedure may be recommended if lifestyle changes and medicines are not enough to improve your peripheral arterial disease (PAD).  After the procedure, you will need to stay in bed and you will be encouraged to drink fluids to wash (flush) dye out of your body. This information is not intended to replace advice given to you by your health care provider. Make sure you discuss any questions you have with your health care provider. Document Released: 07/05/2016 Document Revised: 07/05/2016 Document Reviewed: 07/05/2016 Elsevier Interactive Patient Education  2018 [employer].   No follow-ups on file.   Kris Hartmann, NP

## 2017-10-31 ENCOUNTER — Encounter: Payer: Medicare Other | Admitting: Nurse Practitioner

## 2017-10-31 DIAGNOSIS — Z794 Long term (current) use of insulin: Secondary | ICD-10-CM | POA: Diagnosis not present

## 2017-10-31 DIAGNOSIS — F17210 Nicotine dependence, cigarettes, uncomplicated: Secondary | ICD-10-CM | POA: Diagnosis not present

## 2017-10-31 DIAGNOSIS — E11621 Type 2 diabetes mellitus with foot ulcer: Secondary | ICD-10-CM | POA: Diagnosis not present

## 2017-10-31 DIAGNOSIS — I10 Essential (primary) hypertension: Secondary | ICD-10-CM | POA: Diagnosis not present

## 2017-10-31 DIAGNOSIS — L97411 Non-pressure chronic ulcer of right heel and midfoot limited to breakdown of skin: Secondary | ICD-10-CM | POA: Diagnosis not present

## 2017-10-31 DIAGNOSIS — Z940 Kidney transplant status: Secondary | ICD-10-CM | POA: Diagnosis not present

## 2017-11-01 ENCOUNTER — Ambulatory Visit: Admission: RE | Admit: 2017-11-01 | Payer: Medicare Other | Source: Ambulatory Visit

## 2017-11-03 NOTE — Progress Notes (Signed)
James Moreno, James Moreno ([id number]) Visit Report for 10/24/2017 Abuse/Suicide Risk Screen Details Patient Name: James Moreno, James Moreno. Date of Service: 10/24/2017 8:00 AM Medical Record Number: [id number] Patient Account Number: [id number] Date of Birth/Sex: 1965-08-06 (52 y.o. Male) Treating RN: Cornell Barman Primary Care Ryshawn Sanzone: Lelon Huh Other Clinician: Referring Ambry Dix: Janene Madeira Treating Tennelle Taflinger/Extender: Cathie Olden in Treatment: 0 Abuse/Suicide Risk Screen Items Answer ABUSE/SUICIDE RISK SCREEN: Has anyone close to you tried to hurt or harm you recentlyo No Do you feel uncomfortable with anyone in your familyo No Has anyone forced you do things that you didnot want to doo No Do you have any thoughts of harming yourselfo No Patient displays signs or symptoms of abuse and/or neglect. No Electronic Signature(s) Signed: 10/25/2017 6:17:48 PM By: Gretta Cool, BSN, RN, CWS, Kim RN, BSN Entered By: Gretta Cool, BSN, RN, CWS, Kim on 10/24/2017 08:27:43 James Moreno ([id number]) -------------------------------------------------------------------------------- Activities of Daily Living Details Patient Name: James Moreno, James Moreno. Date of Service: 10/24/2017 8:00 AM Medical Record Number: [id number] Patient Account Number: [id number] Date of Birth/Sex: [birth year] (52 y.o. Male) Treating RN: Cornell Barman Primary Care Jousha Schwandt: Lelon Huh Other Clinician: Referring Sharlynn Seckinger: Janene Madeira Treating Shiann Kam/Extender: Cathie Olden in Treatment: 0 Activities of Daily Living Items Answer Activities of Daily Living (Please select one for each item) Drive Automobile Completely Able Take Medications Completely Able Use Telephone Completely Able Care for Appearance Completely Able Use Toilet Completely Able Bath / Shower Completely Able Dress Self Completely Able Feed Self Completely Able Walk Completely Able Get In / Out Bed Completely Able Housework Completely Able Prepare Meals  Completely Able Handle Money Completely Able Shop for Self Completely Able Electronic Signature(s) Signed: 10/25/2017 6:17:48 PM By: Gretta Cool, BSN, RN, CWS, Kim RN, BSN Entered By: Gretta Cool, BSN, RN, CWS, Kim on 10/24/2017 08:27:52 James Moreno ([id number]) -------------------------------------------------------------------------------- Education Assessment Details Patient Name: James Moreno. Date of Service: 10/24/2017 8:00 AM Medical Record Number: [id number] Patient Account Number: [id number] Date of Birth/Sex: 06-22-1965 (52 y.o. Male) Treating RN: Cornell Barman Primary Care Philis Doke: Lelon Huh Other Clinician: Referring Maxi Carreras: Janene Madeira Treating Lockie Bothun/Extender: Cathie Olden in Treatment: 0 Learning Preferences/Education Level/Primary Language Highest Education Level: College or Above Preferred Language: English Cognitive Barrier Assessment/Beliefs Language Barrier: No Translator Needed: No Memory Deficit: No Emotional Barrier: No Cultural/Religious Beliefs Affecting Medical Care: No Physical Barrier Assessment Impaired Vision: No Impaired Hearing: No Decreased Hand dexterity: No Knowledge/Comprehension Assessment Knowledge Level: High Comprehension Level: High Ability to understand written High instructions: Ability to understand verbal High instructions: Motivation Assessment Anxiety Level: Calm Cooperation: Cooperative Education Importance: Acknowledges Need Interest in Health Problems: Asks Questions Perception: Coherent Willingness to Engage in Self- High Management Activities: Readiness to Engage in Self- High Management Activities: Electronic Signature(s) Signed: 10/25/2017 6:17:48 PM By: Gretta Cool, BSN, RN, CWS, Kim RN, BSN Entered By: Gretta Cool, BSN, RN, CWS, Kim on 10/24/2017 08:28:15 James Moreno, James Moreno ([id number]) -------------------------------------------------------------------------------- Fall Risk Assessment Details Patient Name:  James Moreno. Date of Service: 10/24/2017 8:00 AM Medical Record Number: [id number] Patient Account Number: [id number] Date of Birth/Sex: Oct 25, 1965 (52 y.o. Male) Treating RN: Cornell Barman Primary Care Kanani Mowbray: Lelon Huh Other Clinician: Referring Khaniya Tenaglia: Janene Madeira Treating Katie Faraone/Extender: Cathie Olden in Treatment: 0 Fall Risk Assessment Items Have you had 2 or more falls in the last 12 monthso 0 No Have you had any fall that resulted in injury in the last 12 monthso 0 No FALL RISK ASSESSMENT: History of falling - immediate or within 3 months 0 No Secondary diagnosis  0 No Ambulatory aid None/bed rest/wheelchair/nurse 0 Yes Crutches/cane/walker 0 No Furniture 0 No IV Access/Saline Lock 0 No Gait/Training Normal/bed rest/immobile 0 Yes Weak 0 No Impaired 0 No Mental Status Oriented to own ability 0 No Electronic Signature(s) Signed: 10/25/2017 6:17:48 PM By: Gretta Cool, BSN, RN, CWS, Kim RN, BSN Entered By: Gretta Cool, BSN, RN, CWS, Kim on 10/24/2017 73:41:93 James Moreno, James Moreno ([id number]) -------------------------------------------------------------------------------- Foot Assessment Details Patient Name: James Moreno, James Moreno. Date of Service: 10/24/2017 8:00 AM Medical Record Number: [id number] Patient Account Number: [id number] Date of Birth/Sex: [birth year] (52 y.o. Male) Treating RN: Cornell Barman Primary Care Kailly Richoux: Lelon Huh Other Clinician: Referring Kambree Krauss: Janene Madeira Treating Ysmael Hires/Extender: Cathie Olden in Treatment: 0 Foot Assessment Items Site Locations + = Sensation present, - = Sensation absent, C = Callus, U = Ulcer R = Redness, W = Warmth, M = Maceration, PU = Pre-ulcerative lesion F = Fissure, S = Swelling, D = Dryness Assessment Right: Left: Other Deformity: No No Prior Foot Ulcer: No No Prior Amputation: No No Charcot Joint: No No Ambulatory Status: Ambulatory Without Help Gait: Steady Electronic Signature(s) Signed:  10/25/2017 6:17:48 PM By: Gretta Cool, BSN, RN, CWS, Kim RN, BSN Entered By: Gretta Cool, BSN, RN, CWS, Kim on 10/24/2017 08:33:51 James Moreno ([id number]) -------------------------------------------------------------------------------- Nutrition Risk Assessment Details Patient Name: James Moreno. Date of Service: 10/24/2017 8:00 AM Medical Record Number: [id number] Patient Account Number: [id number] Date of Birth/Sex: 12/23/1965 (52 y.o. Male) Treating RN: Cornell Barman Primary Care Heba Ige: Lelon Huh Other Clinician: Referring Dariush Mcnellis: Janene Madeira Treating Niyla Marone/Extender: Cathie Olden in Treatment: 0 Height (in): 71 Weight (lbs): 235 Body Mass Index (BMI): 32.8 Nutrition Risk Assessment Items NUTRITION RISK SCREEN: I have an illness or condition that made me change the kind and/or amount of 0 No food I eat I eat fewer than two meals per day 0 No I eat few fruits and vegetables, or milk products 0 No I have three or more drinks of beer, liquor or wine almost every day 0 No I have tooth or mouth problems that make it hard for me to eat 0 No I don't always have enough money to buy the food I need 0 No I eat alone most of the time 0 No I take three or more different prescribed or over-the-counter drugs a day 0 No Without wanting to, I have lost or gained 10 pounds in the last six months 0 No I am not always physically able to shop, cook and/or feed myself 0 No Nutrition Protocols Good Risk Protocol 0 No interventions needed Moderate Risk Protocol Electronic Signature(s) Signed: 10/25/2017 6:17:48 PM By: Gretta Cool, BSN, RN, CWS, Kim RN, BSN Entered By: Gretta Cool, BSN, RN, CWS, Kim on 10/24/2017 41:74:08

## 2017-11-04 ENCOUNTER — Other Ambulatory Visit (INDEPENDENT_AMBULATORY_CARE_PROVIDER_SITE_OTHER): Payer: Self-pay | Admitting: Vascular Surgery

## 2017-11-04 ENCOUNTER — Telehealth (INDEPENDENT_AMBULATORY_CARE_PROVIDER_SITE_OTHER): Payer: Self-pay

## 2017-11-04 NOTE — Telephone Encounter (Signed)
Patient called wanting his leg angio with [facility] canceled because he wants his procedure done through [facility]. I had to explain that I cannot schedule him to have a leg angio through [facility] and that he would have to talk with his doctor to have this scheduled with [facility].

## 2017-11-05 ENCOUNTER — Inpatient Hospital Stay: Admission: RE | Admit: 2017-11-05 | Payer: Medicare Other | Source: Ambulatory Visit

## 2017-11-06 NOTE — Progress Notes (Signed)
James Moreno ([id number]) Visit Report for 10/24/2017 Allergy List Details Patient Name: James Moreno, James Moreno. Date of Service: 10/24/2017 8:00 AM Medical Record Number: [id number] Patient Account Number: [id number] Date of Birth/Sex: 09-12-1965 (51 y.o. M) Treating RN: Cornell Barman Primary Care Elnoria Livingston: Lelon Huh Other Clinician: Referring Sondi Desch: Lelon Huh Treating Verdia Bolt/Extender: Lawanda Cousins Weeks in Treatment: 0 Allergies Active Allergies No Known Allergies Allergy Notes Electronic Signature(s) Signed: 10/25/2017 6:17:48 PM By: Gretta Cool, BSN, RN, CWS, Kim RN, BSN Entered By: Gretta Cool, BSN, RN, CWS, Kim on 10/24/2017 08:20:57 James Moreno ([id number]) -------------------------------------------------------------------------------- Arrival Information Details Patient Name: James Moreno. Date of Service: 10/24/2017 8:00 AM Medical Record Number: [id number] Patient Account Number: [id number] Date of Birth/Sex: 1966-03-25 (51 y.o. M) Treating RN: Cornell Barman Primary Care Kie Calvin: Lelon Huh Other Clinician: Referring Kyian Obst: Lelon Huh Treating Charnette Younkin/Extender: Cathie Olden in Treatment: 0 Visit Information Patient Arrived: Ambulatory Arrival Time: 08:18 Accompanied By: self Transfer Assistance: None Patient Identification Verified: Yes Secondary Verification Process Completed: Yes Patient Has Alerts: Yes Patient Alerts: Type II Diabetic Hep B + Electronic Signature(s) Signed: 10/24/2017 5:12:19 PM By: Alric Quan Entered By: Alric Quan on 10/24/2017 17:12:18 James Moreno ([id number]) -------------------------------------------------------------------------------- Clinic Level of Care Assessment Details Patient Name: James Moreno. Date of Service: 10/24/2017 8:00 AM Medical Record Number: [id number] Patient Account Number: [id number] Date of Birth/Sex: [birth year] (51 y.o. M) Treating RN: Ahmed Prima Primary Care Brinlyn Cena:  Lelon Huh Other Clinician: Referring Shermika Balthaser: Lelon Huh Treating Braileigh Landenberger/Extender: Cathie Olden in Treatment: 0 Clinic Level of Care Assessment Items TOOL 1 Quantity Score X - Use when EandM and Procedure is performed on INITIAL visit 1 0 ASSESSMENTS - Nursing Assessment / Reassessment X - General Physical Exam (combine w/ comprehensive assessment (listed just below) when 1 20 performed on new pt. evals) X- 1 25 Comprehensive Assessment (HX, ROS, Risk Assessments, Wounds Hx, etc.) ASSESSMENTS - Wound and Skin Assessment / Reassessment []  - Dermatologic / Skin Assessment (not related to wound area) 0 ASSESSMENTS - Ostomy and/or Continence Assessment and Care []  - Incontinence Assessment and Management 0 []  - 0 Ostomy Care Assessment and Management (repouching, etc.) PROCESS - Coordination of Care X - Simple Patient / Family Education for ongoing care 1 15 []  - 0 Complex (extensive) Patient / Family Education for ongoing care []  - 0 Staff obtains Programmer, systems, Records, Test Results / Process Orders []  - 0 Staff telephones HHA, Nursing Homes / Clarify orders / etc []  - 0 Routine Transfer to another Facility (non-emergent condition) []  - 0 Routine Hospital Admission (non-emergent condition) X- 1 15 New Admissions / Biomedical engineer / Ordering NPWT, Apligraf, etc. []  - 0 Emergency Hospital Admission (emergent condition) PROCESS - Special Needs []  - Pediatric / Minor Patient Management 0 []  - 0 Isolation Patient Management []  - 0 Hearing / Language / Visual special needs []  - 0 Assessment of Community assistance (transportation, D/C planning, etc.) []  - 0 Additional assistance / Altered mentation []  - 0 Support Surface(s) Assessment (bed, cushion, seat, etc.) [name], [name] ([id number]) INTERVENTIONS - Miscellaneous []  - External ear exam 0 []  - 0 Patient Transfer (multiple staff / Civil Service fast streamer / Similar devices) []  - 0 Simple Staple / Suture  removal (25 or less) []  - 0 Complex Staple / Suture removal (26 or more) []  - 0 Hypo/Hyperglycemic Management (do not check if billed separately) X- 1 15 Ankle / Brachial Index (ABI) - do not check if billed separately Has the patient been seen at the  hospital within the last three years: Yes Total Score: 90 Level Of Care: New/Established - Level 3 Electronic Signature(s) Signed: 10/28/2017 4:59:33 PM By: Alric Quan Entered By: Alric Quan on 10/24/2017 09:17:29 James Moreno ([id number]) -------------------------------------------------------------------------------- Encounter Discharge Information Details Patient Name: James Moreno. Date of Service: 10/24/2017 8:00 AM Medical Record Number: [id number] Patient Account Number: [id number] Date of Birth/Sex: February 21, 1966 (51 y.o. M) Treating RN: Roger Shelter Primary Care Finbar Nippert: Lelon Huh Other Clinician: Referring Trayquan Kolakowski: Lelon Huh Treating Darrielle Pflieger/Extender: Cathie Olden in Treatment: 0 Encounter Discharge Information Items Discharge Condition: Stable Ambulatory Status: Ambulatory Discharge Destination: Home Transportation: Private Auto Schedule Follow-up Appointment: Yes Clinical Summary of Care: Electronic Signature(s) Signed: 10/25/2017 4:54:12 PM By: Roger Shelter Entered By: Roger Shelter on 10/24/2017 09:24:26 James Moreno ([id number]) -------------------------------------------------------------------------------- Lower Extremity Assessment Details Patient Name: James Moreno. Date of Service: 10/24/2017 8:00 AM Medical Record Number: [id number] Patient Account Number: [id number] Date of Birth/Sex: 03/25/1966 (51 y.o. M) Treating RN: Cornell Barman Primary Care Kashton Mcartor: Lelon Huh Other Clinician: Referring Zaelyn Noack: Lelon Huh Treating Calisha Tindel/Extender: Lawanda Cousins Weeks in Treatment: 0 Edema Assessment Assessed: [Left: No] [Right: No] Edema: [Left: No]  [Right: No] Vascular Assessment Claudication: Claudication Assessment [Left:Intermittent] [Right:Intermittent] Pulses: Dorsalis Pedis Palpable: [Left:No] [Right:Yes] Doppler Audible: [Left:Yes] [Right:Yes] Posterior Tibial Palpable: [Left:Yes] [Right:Yes] Doppler Audible: [Left:Yes] [Right:Yes] Extremity colors, hair growth, and conditions: Extremity Color: [Left:Normal] [Right:Normal] Hair Growth on Extremity: [Left:Yes] [Right:Yes] Temperature of Extremity: [Left:Warm] [Right:Warm] Capillary Refill: [Left:< 3 seconds] [Right:< 3 seconds] Dependent Rubor: [Left:No] [Right:No] Blanched when Elevated: [Left:No] [Right:No] Lipodermatosclerosis: [Left:No] [Right:No] Blood Pressure: Brachial: [Left:180] [Right:156] Dorsalis Pedis: 110 [Left:Dorsalis Pedis: 140] Ankle: Posterior Tibial: 110 [Left:Posterior Tibial: 160 0.61] [Right:0.89] Toe Nail Assessment Left: Right: Thick: Yes Yes Discolored: Yes Yes Deformed: Yes Yes Improper Length and Hygiene: Yes Yes Electronic Signature(s) Signed: 10/25/2017 6:17:48 PM By: Gretta Cool, BSN, RN, CWS, Kim RN, BSN Entered By: Gretta Cool, BSN, RN, CWS, Kim on 10/24/2017 08:46:31 James Moreno ([id number]) -------------------------------------------------------------------------------- Multi Wound Chart Details Patient Name: James Moreno. Date of Service: 10/24/2017 8:00 AM Medical Record Number: [id number] Patient Account Number: [id number] Date of Birth/Sex: 17-Apr-1965 (51 y.o. M) Treating RN: Ahmed Prima Primary Care Damaris Abeln: Lelon Huh Other Clinician: Referring Donell Sliwinski: Lelon Huh Treating Aeris Hersman/Extender: Cathie Olden in Treatment: 0 Vital Signs Height(in): 71 Pulse(bpm): 36 Weight(lbs): 235 Blood Pressure(mmHg): 159/79 Body Mass Index(BMI): 33 Temperature(F): 98.14 Respiratory Rate 16 (breaths/min): Photos: [1:No Photos] [N/A:N/A] Wound Location: [1:Right Calcaneus - Medial] [N/A:N/A] Wounding Event:  [1:Trauma] [N/A:N/A] Primary Etiology: [1:Diabetic Wound/Ulcer of the Lower Extremity] [N/A:N/A] Comorbid History: [1:Hypertension, Hepatitis B, Type II Diabetes] [N/A:N/A] Date Acquired: [1:08/26/2017] [N/A:N/A] Weeks of Treatment: [1:0] [N/A:N/A] Wound Status: [1:Open] [N/A:N/A] Measurements L x W x D [1:1x1.2x0.1] [N/A:N/A] (cm) Area (cm) : [1:0.942] [N/A:N/A] Volume (cm) : [1:0.094] [N/A:N/A] Classification: [1:Unable to visualize wound bed N/A] Exudate Amount: [1:None Present] [N/A:N/A] Wound Margin: [1:Indistinct, nonvisible] [N/A:N/A] Granulation Amount: [1:None Present (0%)] [N/A:N/A] Necrotic Amount: [1:Large (67-100%)] [N/A:N/A] Necrotic Tissue: [1:Eschar] [N/A:N/A] Exposed Structures: [1:Fascia: No Fat Layer (Subcutaneous Tissue) Exposed: No Tendon: No Muscle: No Joint: No Bone: No] [N/A:N/A] Epithelialization: [1:None] [N/A:N/A] Debridement: [1:Debridement - Selective/Open N/A Wound] Pre-procedure [1:09:01] [N/A:N/A] Verification/Time Out Taken: Pain Control: [1:Lidocaine 4% Topical Solution] [N/A:N/A] Tissue Debrided: [1:Callus] [N/A:N/A] Level: [1:Non-Viable Tissue] [N/A:N/A] Debridement Area (sq cm): [1:1.2] [N/A:N/A] Instrument: [1:Blade] [N/A:N/A] Bleeding: Minimum N/A N/A Hemostasis Achieved: Pressure N/A N/A Procedural Pain: 0 N/A N/A Post Procedural Pain: 0 N/A N/A Debridement Treatment Procedure was tolerated well N/A N/A Response: Post Debridement 0.5x0.4x0.1  N/A N/A Measurements L x W x D (cm) Post Debridement Volume: 0.016 N/A N/A (cm) Periwound Skin Texture: Excoriation: No N/A N/A Induration: No Callus: No Crepitus: No Rash: No Scarring: No Periwound Skin Moisture: Maceration: No N/A N/A Dry/Scaly: No Periwound Skin Color: Atrophie Blanche: No N/A N/A Cyanosis: No Ecchymosis: No Erythema: No Hemosiderin Staining: No Mottled: No Pallor: No Rubor: No Tenderness on Palpation: No N/A N/A Wound Preparation: Ulcer Cleansing: N/A  N/A Rinsed/Irrigated with Saline Topical Anesthetic Applied: Other: lidocaiine 4% Procedures Performed: Debridement N/A N/A Treatment Notes Wound #1 (Right, Medial Calcaneus) 1. Cleansed with: Clean wound with Normal Saline 2. Anesthetic Topical Lidocaine 4% cream to wound bed prior to debridement 4. Dressing Applied: Hydrafera Blue 5. Secondary Dressing Applied Dry Gauze Kerlix/Conform Electronic Signature(s) Signed: 10/24/2017 12:24:59 PM By: Lawanda Cousins Entered By: Lawanda Cousins on 10/24/2017 12:24:59 EMON, LANCE ([id number]) -------------------------------------------------------------------------------- [place] Details Patient Name: James Moreno, James Moreno. Date of Service: 10/24/2017 8:00 AM Medical Record Number: [id number] Patient Account Number: [id number] Date of Birth/Sex: Apr 22, 1965 (51 y.o. M) Treating RN: Ahmed Prima Primary Care Shi Blankenship: Lelon Huh Other Clinician: Referring Pilar Westergaard: Lelon Huh Treating Kelty Szafran/Extender: Cathie Olden in Treatment: 0 Active Inactive ` Nutrition Nursing Diagnoses: Imbalanced nutrition Impaired glucose control: actual or potential Potential for alteratiion in Nutrition/Potential for imbalanced nutrition Goals: Patient/caregiver agrees to and verbalizes understanding of need to use nutritional supplements and/or vitamins as prescribed Date Initiated: 10/24/2017 Target Resolution Date: 03/01/2018 Goal Status: Active Patient/caregiver will maintain therapeutic glucose control Date Initiated: 10/24/2017 Target Resolution Date: 03/01/2018 Goal Status: Active Interventions: Assess patient nutrition upon admission and as needed per policy Provide education on elevated blood sugars and impact on wound healing Provide education on nutrition Notes: ` Orientation to the Wound Care Program Nursing Diagnoses: Knowledge deficit related to the wound healing center program Goals: Patient/caregiver  will verbalize understanding of the [facility] [facility] Date Initiated: 10/24/2017 Target Resolution Date: 11/30/2017 Goal Status: Active Interventions: Provide education on orientation to the wound center Notes: ` Wound/Skin Impairment Nursing Diagnoses: HILLARY, SCHWEGLER ([id number]) Impaired tissue integrity Knowledge deficit related to smoking impact on wound healing Knowledge deficit related to ulceration/compromised skin integrity Goals: Ulcer/skin breakdown will have a volume reduction of 80% by week 12 Date Initiated: 10/24/2017 Target Resolution Date: 02/22/2018 Goal Status: Active Interventions: Assess patient/caregiver ability to perform ulcer/skin care regimen upon admission and as needed Assess ulceration(s) every visit Notes: Electronic Signature(s) Signed: 10/28/2017 4:59:33 PM By: Alric Quan Entered By: Alric Quan on 10/24/2017 08:58:11 James Moreno ([id number]) -------------------------------------------------------------------------------- Pain Assessment Details Patient Name: James Moreno. Date of Service: 10/24/2017 8:00 AM Medical Record Number: [id number] Patient Account Number: [id number] Date of Birth/Sex: 01-02-1966 (51 y.o. M) Treating RN: Cornell Barman Primary Care Jessalyn Hinojosa: Lelon Huh Other Clinician: Referring Burhan Barham: Lelon Huh Treating Marieme Mcmackin/Extender: Lawanda Cousins Weeks in Treatment: 0 Active Problems Location of Pain Severity and Description of Pain Patient Has Paino No Site Locations Rate the pain. Current Pain Level: 2 Pain Management and Medication Current Pain Management: Electronic Signature(s) Signed: 10/25/2017 6:17:48 PM By: Gretta Cool, BSN, RN, CWS, Kim RN, BSN Entered By: Gretta Cool, BSN, RN, CWS, Kim on 10/24/2017 08:19:11 James Moreno ([id number]) -------------------------------------------------------------------------------- Patient/Caregiver Education Details Patient Name: James Moreno. Date of  Service: 10/24/2017 8:00 AM Medical Record Number: [id number] Patient Account Number: [id number] Date of Birth/Gender: 1966/03/02 (51 y.o. M) Treating RN: Roger Shelter Primary Care Physician: Lelon Huh Other Clinician: Referring Physician: Lelon Huh Treating Physician/Extender: Cathie Olden in  Treatment: 0 Education Assessment Education Provided To: Patient Education Topics Provided Welcome To [facility]: Handouts: [facility] Methods: Explain/Verbal Responses: State content correctly Wound Debridement: Handouts: Wound Debridement Methods: Explain/Verbal Responses: State content correctly Wound/Skin Impairment: Handouts: Caring for Your Ulcer Methods: Explain/Verbal Responses: State content correctly Electronic Signature(s) Signed: 10/25/2017 4:54:12 PM By: Roger Shelter Entered By: Roger Shelter on 10/24/2017 09:25:00 James Moreno ([id number]) -------------------------------------------------------------------------------- Wound Assessment Details Patient Name: James Moreno. Date of Service: 10/24/2017 8:00 AM Medical Record Number: [id number] Patient Account Number: [id number] Date of Birth/Sex: 11/20/1965 (51 y.o. M) Treating RN: Cornell Barman Primary Care Harlin Mazzoni: Lelon Huh Other Clinician: Referring Araseli Sherry: Lelon Huh Treating Christin Moline/Extender: Lawanda Cousins Weeks in Treatment: 0 Wound Status Wound Number: 1 Primary Etiology: Diabetic Wound/Ulcer of the Lower Extremity Wound Location: Right Calcaneus - Medial Wound Status: Open Wounding Event: Trauma Comorbid Hypertension, Hepatitis B, Type II Diabetes Date Acquired: 08/26/2017 History: Weeks Of Treatment: 0 Clustered Wound: No Photos Photo Uploaded By: Gretta Cool, BSN, RN, CWS, Kim on 10/25/2017 18:05:21 Wound Measurements Length: (cm) 1 Width: (cm) 1.2 Depth: (cm) 0.1 Area: (cm) 0.942 Volume: (cm) 0.094 % Reduction in Area: % Reduction  in Volume: Epithelialization: None Tunneling: No Wound Description Classification: Unable to visualize wound bed Wound Margin: Indistinct, nonvisible Exudate Amount: None Present Foul Odor After Cleansing: No Slough/Fibrino Yes Wound Bed Granulation Amount: None Present (0%) Exposed Structure Necrotic Amount: Large (67-100%) Fascia Exposed: No Necrotic Quality: Eschar Fat Layer (Subcutaneous Tissue) Exposed: No Tendon Exposed: No Muscle Exposed: No Joint Exposed: No Bone Exposed: No Periwound Skin Texture Texture Color No Abnormalities Noted: Yes No Abnormalities Noted: Yes CARLY, APPLEGATE R. ([id number]) Moisture No Abnormalities Noted: No Dry / Scaly: No Maceration: No Wound Preparation Ulcer Cleansing: Rinsed/Irrigated with Saline Topical Anesthetic Applied: Other: lidocaiine 4%, Electronic Signature(s) Signed: 10/25/2017 6:17:48 PM By: Gretta Cool, BSN, RN, CWS, Kim RN, BSN Entered By: Gretta Cool, BSN, RN, CWS, Kim on 10/24/2017 08:35:40 James Moreno ([id number]) -------------------------------------------------------------------------------- [place] Details Patient Name: James Moreno. Date of Service: 10/24/2017 8:00 AM Medical Record Number: [id number] Patient Account Number: [id number] Date of Birth/Sex: 04-01-1965 (51 y.o. M) Treating RN: Cornell Barman Primary Care Jameel Quant: Lelon Huh Other Clinician: Referring Paisleigh Maroney: Lelon Huh Treating Alyric Parkin/Extender: Cathie Olden in Treatment: 0 Vital Signs Time Taken: 08:19 Temperature (F): 98.14 Height (in): 71 Pulse (bpm): 91 Weight (lbs): 235 Respiratory Rate (breaths/min): 16 Body Mass Index (BMI): 32.8 Blood Pressure (mmHg): 159/79 Reference Range: 80 - 120 mg / dl Electronic Signature(s) Signed: 10/25/2017 6:17:48 PM By: Gretta Cool, BSN, RN, CWS, Kim RN, BSN Entered By: Gretta Cool, BSN, RN, CWS, Kim on 10/24/2017 08:19:42

## 2017-11-06 NOTE — Progress Notes (Signed)
LAWTON, DOLLINGER ([id number]) Visit Report for 10/24/2017 Chief Complaint Document Details Patient Name: James Moreno, James Moreno. Date of Service: 10/24/2017 8:00 AM Medical Record Number: [id number] Patient Account Number: [id number] Date of Birth/Sex: Feb 19, 1966 (52 y.o. M) Treating RN: Ahmed Prima Primary Care Provider: Lelon Huh Other Clinician: Referring Provider: Lelon Huh Treating Provider/Extender: Lawanda Cousins Weeks in Treatment: 0 Information Obtained from: Patient Chief Complaint right heel wound Electronic Signature(s) Signed: 10/24/2017 12:25:37 PM By: Lawanda Cousins Entered By: Lawanda Cousins on 10/24/2017 12:25:37 James Moreno ([id number]) -------------------------------------------------------------------------------- Debridement Details Patient Name: James Moreno. Date of Service: 10/24/2017 8:00 AM Medical Record Number: [id number] Patient Account Number: [id number] Date of Birth/Sex: 01/26/1966 (52 y.o. M) Treating RN: Ahmed Prima Primary Care Provider: Lelon Huh Other Clinician: Referring Provider: Lelon Huh Treating Provider/Extender: Cathie Olden in Treatment: 0 Debridement Performed for Wound #1 Right,Medial Calcaneus Assessment: Performed By: Physician Lawanda Cousins, NP Debridement Type: Debridement Severity of Tissue Pre Limited to breakdown of skin Debridement: Pre-procedure Verification/Time Yes - 09:01 Out Taken: Start Time: 09:01 Pain Control: Lidocaine 4% Topical Solution Total Area Debrided (L x W): 1 (cm) x 1.2 (cm) = 1.2 (cm) Tissue and other material Non-Viable, Callus, Skin: Dermis , Skin: Epidermis, Fibrin/Exudate debrided: Level: Skin/Epidermis Debridement Description: Selective/Open Wound Instrument: Blade Bleeding: Minimum Hemostasis Achieved: Pressure End Time: 09:12 Procedural Pain: 0 Post Procedural Pain: 0 Response to Treatment: Procedure was tolerated well Level of Consciousness: Awake and  Alert Post Debridement Measurements of Total Wound Length: (cm) 0.5 Width: (cm) 0.4 Depth: (cm) 0.1 Volume: (cm) 0.016 Character of Wound/Ulcer Post Debridement: Requires Further Debridement Severity of Tissue Post Debridement: Limited to breakdown of skin Post Procedure Diagnosis Same as Pre-procedure Electronic Signature(s) Signed: 10/24/2017 12:25:24 PM By: Lawanda Cousins Signed: 10/28/2017 4:59:33 PM By: Alric Quan Entered By: Lawanda Cousins on 10/24/2017 12:25:24 James Moreno ([id number]) -------------------------------------------------------------------------------- HPI Details Patient Name: James Moreno. Date of Service: 10/24/2017 8:00 AM Medical Record Number: [id number] Patient Account Number: [id number] Date of Birth/Sex: May 05, 1965 (52 y.o. M) Treating RN: Ahmed Prima Primary Care Provider: Lelon Huh Other Clinician: Referring Provider: Lelon Huh Treating Provider/Extender: Cathie Olden in Treatment: 0 History of Present Illness HPI Description: 10/24/17-He is seen in initial evaluation for a right posterior heel wound. He states approximately 2 months ago he sustained an injury while using a pumice/callus grater on his heels. In the time since that injury he has seen [facility], [facility], Dr. Cleda Mccreedy at [facility], [facility], infectious disease, and urgent care. He is currently on no antibiotc therapy but has a history of being on keflex and bactrim (at time of injury) which was discontinued at presentation to [facility] ER (within a week after injury) and initiated on cipro and clindamycin. He did see infectious disease in [place] who recommended a CAT scan, he has not heard back about the CAT scan. He saw Dr. Cleda Mccreedy last week who referred him to [facility] for evaluation. he admits to not offloading, was never instructed to. He is on chronic immunosuppression secondary to a renal transplant 2016. His  diabetes is controlled with an A1c of 7.4 last week. He does admit to pain, relieved without need for medication. He is smoking, with a plan to quit smoking cigarettes by next Monday with individual goal to decrease the amount of nicotine and his vape to 0%. He has been encouraged to expedite the smoking cessation. Electronic Signature(s) Signed: 10/24/2017 12:30:42 PM By: Lawanda Cousins Entered By:  Chamille Werntz on 10/24/2017 12:30:42 DAVIEL, ALLEGRETTO ([id number]) -------------------------------------------------------------------------------- Physical Exam Details Patient Name: James Moreno. Date of Service: 10/24/2017 8:00 AM Medical Record Number: [id number] Patient Account Number: [id number] Date of Birth/Sex: [birth year] (52 y.o. M) Treating RN: Ahmed Prima Primary Care Provider: Lelon Huh Other Clinician: Referring Provider: Lelon Huh Treating Provider/Extender: Lawanda Cousins Weeks in Treatment: 0 Constitutional . Respiratory respirations are even and unlabored. clear throughout. Cardiovascular s1 s2 regular rate and rhythm. RLE_ palpable DP, non-palpable PT. RLE- warm to touch, hairlessness to distal half of leg, scattered pale purple discoloration to the lateral heel. Musculoskeletal ambulates with no assistive devices. Psychiatric appears to have appropriate insight and judgement to medical care. oriented x4. calm, cooperative. Electronic Signature(s) Signed: 10/24/2017 12:32:27 PM By: Lawanda Cousins Entered By: Lawanda Cousins on 10/24/2017 12:32:27 James Moreno ([id number]) -------------------------------------------------------------------------------- Physician Orders Details Patient Name: James Moreno. Date of Service: 10/24/2017 8:00 AM Medical Record Number: [id number] Patient Account Number: [id number] Date of Birth/Sex: [birth year] (52 y.o. M) Treating RN: Ahmed Prima Primary Care Provider: Lelon Huh Other Clinician: Referring Provider:  Lelon Huh Treating Provider/Extender: Cathie Olden in Treatment: 0 Verbal / Phone Orders: Yes Clinician: Carolyne Fiscal, Debi Read Back and Verified: Yes Diagnosis Coding ICD-10 Coding Code Description E11.621 Type 2 diabetes mellitus with foot ulcer L97.411 Non-pressure chronic ulcer of right heel and midfoot limited to breakdown of skin F17.219 Nicotine dependence, cigarettes, with unspecified nicotine-induced disorders Wound Cleansing Wound #1 Right,Medial Calcaneus o Clean wound with Normal Saline. Anesthetic (add to Medication List) Wound #1 Right,Medial Calcaneus o Topical Lidocaine 4% cream applied to wound bed prior to debridement (In Clinic Only). Primary Wound Dressing Wound #1 Right,Medial Calcaneus o Hydrafera Blue Ready Transfer Secondary Dressing Wound #1 Right,Medial Calcaneus o Dry Gauze o Conform/Kerlix Dressing Change Frequency Wound #1 Right,Medial Calcaneus o Change dressing every other day. Follow-up Appointments Wound #1 Right,Medial Calcaneus o Return Appointment in 1 week. Off-Loading Wound #1 Right,Medial Calcaneus o Other: - little or no pressure to affected area Patient Medications Allergies: No Known Allergies Notifications Medication Indication Start End lidocaine LEIAM, HOPWOOD ([id number]) Notifications Medication Indication Start End DOSE 1 - topical 4 % cream - 1 cream topical Electronic Signature(s) Signed: 10/24/2017 9:54:05 PM By: Lawanda Cousins Entered By: Lawanda Cousins on 10/24/2017 12:32:43 ZAVION, SLEIGHT ([id number]) -------------------------------------------------------------------------------- Prescription 10/24/2017 Patient Name: [name] R. Provider: Lawanda Cousins NP Date of Birth: 1965/09/01 NPI#: [id number] Sex: Jerilynn Mages DEA#: [id number] Phone #: [phone] License #: Patient Address: [facility] [place], [place] [address] [address] [place], [place] [address] [phone] Allergies No Known Allergies Medication Medication: Route: Strength: Form: lidocaine 4 % topical cream topical 4% cream Class: TOPICAL LOCAL ANESTHETICS Dose: Frequency / Time: Indication: 1 1 cream topical Number of Refills: Number of Units: 0 Generic Substitution: Start Date: End Date: One Time Use: Substitution Permitted No Note to Pharmacy: Signature(s): Date(s): Electronic Signature(s) Signed: 10/24/2017 9:54:05 PM By: Lawanda Cousins Entered By: Lawanda Cousins on 10/24/2017 12:32:44 James Moreno ([id number]) --------------------------------------------------------------------------------  Problem List Details Patient Name: James Moreno. Date of Service: 10/24/2017 8:00 AM Medical Record Number: [id number] Patient Account Number: [id number] Date of Birth/Sex: [birth year] (52 y.o. M) Treating RN: Ahmed Prima Primary Care Provider: Lelon Huh Other Clinician: Referring Provider: Lelon Huh Treating Provider/Extender: Cathie Olden in Treatment: 0 Active Problems ICD-10 Evaluated Encounter Code Description Active Date Today Diagnosis E11.621 Type 2 diabetes mellitus with  foot ulcer 10/24/2017 No Yes L97.411 Non-pressure chronic ulcer of right heel and midfoot limited 10/24/2017 No Yes to breakdown of skin F17.219 Nicotine dependence, cigarettes, with unspecified nicotine- 10/24/2017 No Yes induced disorders Inactive Problems Resolved Problems Electronic Signature(s) Signed: 10/24/2017 12:24:53 PM By: Lawanda Cousins Entered By: Lawanda Cousins on 10/24/2017 12:24:52 James Moreno ([id number]) -------------------------------------------------------------------------------- Progress Note Details Patient Name: James Moreno. Date of Service: 10/24/2017 8:00 AM Medical Record Number: [id number] Patient Account Number: [id number] Date of Birth/Sex:  02-24-1966 (52 y.o. M) Treating RN: Ahmed Prima Primary Care Provider: Lelon Huh Other Clinician: Referring Provider: Lelon Huh Treating Provider/Extender: Cathie Olden in Treatment: 0 Subjective Chief Complaint Information obtained from Patient right heel wound History of Present Illness (HPI) 10/24/17-He is seen in initial evaluation for a right posterior heel wound. He states approximately 2 months ago he sustained an injury while using a pumice/callus grater on his heels. In the time since that injury he has seen [facility], [facility], Dr. Cleda Mccreedy at [facility], [facility], infectious disease, and urgent care. He is currently on no antibiotc therapy but has a history of being on keflex and bactrim (at time of injury) which was discontinued at presentation to [facility] ER (within a week after injury) and initiated on cipro and clindamycin. He did see infectious disease in [place] who recommended a CAT scan, he has not heard back about the CAT scan. He saw Dr. Cleda Mccreedy last week who referred him to [facility] for evaluation. he admits to not offloading, was never instructed to. He is on chronic immunosuppression secondary to a renal transplant 2016. His diabetes is controlled with an A1c of 7.4 last week. He does admit to pain, relieved without need for medication. He is smoking, with a plan to quit smoking cigarettes by next Monday with individual goal to decrease the amount of nicotine and his vape to 0%. He has been encouraged to expedite the smoking cessation. Wound History Patient presents with 1 open wound that has been present for approximately 2 months. Patient has been treating wound in the following manner: no dressing. Laboratory tests have been performed in the last month. Patient reportedly has not tested positive for an antibiotic resistant organism. Patient reportedly has not tested positive for osteomyelitis. Patient  reportedly has not had testing performed to evaluate circulation in the legs. Patient History Information obtained from Patient. Allergies No Known Allergies Social History Current every day smoker - 1/2 pack daily, Marital Status - Married, Alcohol Use - Rarely, Drug Use - No History, Caffeine Use - Daily. Medical History Eyes Denies history of Cataracts, Glaucoma, Optic Neuritis Ear/Nose/Mouth/Throat Denies history of Chronic sinus problems/congestion, Middle ear problems Hematologic/Lymphatic Denies history of Anemia, Hemophilia, Human Immunodeficiency Virus, Lymphedema, Sickle Cell Disease Respiratory Denies history of Aspiration, Asthma, Chronic Obstructive Pulmonary Disease (COPD), Pneumothorax, Sleep Apnea, Tuberculosis Cardiovascular XENG, KUCHER ([id number]) Patient has history of Hypertension Denies history of Angina, Arrhythmia, Congestive Heart Failure, Coronary Artery Disease, Deep Vein Thrombosis, Hypotension, Myocardial Infarction, Peripheral Arterial Disease, Peripheral Venous Disease, Phlebitis, Vasculitis Gastrointestinal Patient has history of Hepatitis B Denies history of Cirrhosis , Colitis, Crohn s, Hepatitis A, Hepatitis C Endocrine Patient has history of Type II Diabetes - 7.4% Denies history of Type I Diabetes Genitourinary Denies history of End Stage Renal Disease Immunological Denies history of Lupus Erythematosus, Raynaud s, Scleroderma Integumentary (Skin) Denies history of History of Burn, History of pressure wounds Musculoskeletal Denies history of Gout, Rheumatoid Arthritis, Osteoarthritis, Osteomyelitis  Neurologic Denies history of Dementia, Neuropathy, Quadriplegia, Paraplegia, Seizure Disorder Oncologic Denies history of Received Chemotherapy, Received Radiation Psychiatric Denies history of Anorexia/bulimia, Confinement Anxiety Patient is treated with Insulin. Blood sugar is not tested. Blood sugar results noted at the following  times: Breakfast - 120. Hospitalization/Surgery History - 06/24/2016, [facility], Kidney transplant. Review of Systems (ROS) Constitutional Symptoms (General Health) The patient has no complaints or symptoms. Eyes The patient has no complaints or symptoms. Ear/Nose/Mouth/Throat The patient has no complaints or symptoms. Hematologic/Lymphatic The patient has no complaints or symptoms. Respiratory The patient has no complaints or symptoms. Cardiovascular The patient has no complaints or symptoms. Gastrointestinal The patient has no complaints or symptoms. Endocrine The patient has no complaints or symptoms. Genitourinary Complains or has symptoms of Kidney failure/ Dialysis - Transplant April 2016. Immunological The patient has no complaints or symptoms. Integumentary (Skin) Complains or has symptoms of Wounds. Denies complaints or symptoms of Bleeding or bruising tendency, Breakdown, Swelling. Musculoskeletal The patient has no complaints or symptoms. Neurologic The patient has no complaints or symptoms. Oncologic The patient has no complaints or symptoms. Psychiatric The patient has no complaints or symptoms. HONDO, NANDA ([id number]) Objective Constitutional Vitals Time Taken: 8:19 AM, Height: 71 in, Weight: 235 lbs, BMI: 32.8, Temperature: 98.14 F, Pulse: 91 bpm, Respiratory Rate: 16 breaths/min, Blood Pressure: 159/79 mmHg. Respiratory respirations are even and unlabored. clear throughout. Cardiovascular s1 s2 regular rate and rhythm. RLE_ palpable DP, non-palpable PT. RLE- warm to touch, hairlessness to distal half of leg, scattered pale purple discoloration to the lateral heel. Musculoskeletal ambulates with no assistive devices. Psychiatric appears to have appropriate insight and judgement to medical care. oriented x4. calm, cooperative. Integumentary (Hair, Skin) Wound #1 status is Open. Original cause of wound was Trauma. The wound is located on the  Right,Medial Calcaneus. The wound measures 1cm length x 1.2cm width x 0.1cm depth; 0.942cm^2 area and 0.094cm^3 volume. There is no tunneling noted. There is a none present amount of drainage noted. The wound margin is indistinct and nonvisible. There is no granulation within the wound bed. There is a large (67-100%) amount of necrotic tissue within the wound bed including Eschar. The periwound skin appearance had no abnormalities noted for texture. The periwound skin appearance had no abnormalities noted for color. The periwound skin appearance did not exhibit: Dry/Scaly, Maceration. Assessment Active Problems ICD-10 Type 2 diabetes mellitus with foot ulcer Non-pressure chronic ulcer of right heel and midfoot limited to breakdown of skin Nicotine dependence, cigarettes, with unspecified nicotine-induced disorders Procedures JAMARQUES, PINEDO ([id number]) Wound #1 Pre-procedure diagnosis of Wound #1 is a Diabetic Wound/Ulcer of the Lower Extremity located on the Right,Medial Calcaneus .Severity of Tissue Pre Debridement is: Limited to breakdown of skin. There was a Selective/Open Wound Skin/Epidermis Debridement with a total area of 1.2 sq cm performed by Lawanda Cousins, NP. With the following instrument(s): Blade to remove Non-Viable tissue/material. Material removed includes Callus, Skin: Dermis, Skin: Epidermis, and Fibrin/Exudate after achieving pain control using Lidocaine 4% Topical Solution. No specimens were taken. A time out was conducted at 09:01, prior to the start of the procedure. A Minimum amount of bleeding was controlled with Pressure. The procedure was tolerated well with a pain level of 0 throughout and a pain level of 0 following the procedure. Patient s Level of Consciousness post procedure was recorded as Awake and Alert. Post Debridement Measurements: 0.5cm length x 0.4cm width x 0.1cm depth; 0.016cm^3 volume. Character of Wound/Ulcer Post Debridement requires further  debridement.  Severity of Tissue Post Debridement is: Limited to breakdown of skin. Post procedure Diagnosis Wound #1: Same as Pre-Procedure Plan Wound Cleansing: Wound #1 Right,Medial Calcaneus: Clean wound with Normal Saline. Anesthetic (add to Medication List): Wound #1 Right,Medial Calcaneus: Topical Lidocaine 4% cream applied to wound bed prior to debridement (In Clinic Only). Primary Wound Dressing: Wound #1 Right,Medial Calcaneus: Hydrafera Blue Ready Transfer Secondary Dressing: Wound #1 Right,Medial Calcaneus: Dry Gauze Conform/Kerlix Dressing Change Frequency: Wound #1 Right,Medial Calcaneus: Change dressing every other day. Follow-up Appointments: Wound #1 Right,Medial Calcaneus: Return Appointment in 1 week. Off-Loading: Wound #1 Right,Medial Calcaneus: Other: - little or no pressure to affected area The following medication(s) was prescribed: lidocaine topical 4 % cream 1 1 cream topical was prescribed at facility Electronic Signature(s) Signed: 10/24/2017 12:32:51 PM By: Lawanda Cousins Entered By: Lawanda Cousins on 10/24/2017 12:32:51 James Moreno ([id number]) -------------------------------------------------------------------------------- ROS/PFSH Details Patient Name: James Moreno. Date of Service: 10/24/2017 8:00 AM Medical Record Number: [id number] Patient Account Number: [id number] Date of Birth/Sex: Jan 12, 1966 (52 y.o. M) Treating RN: Cornell Barman Primary Care Provider: Lelon Huh Other Clinician: Referring Provider: Lelon Huh Treating Provider/Extender: Cathie Olden in Treatment: 0 Information Obtained From Patient Wound History Do you currently have one or more open woundso Yes How many open wounds do you currently haveo 1 Approximately how long have you had your woundso 2 months How have you been treating your wound(s) until nowo no dressing Has your wound(s) ever healed and then re-openedo No Have you tested positive for an  antibiotic resistant organism (MRSA, VRE)o No Have you tested positive for osteomyelitis (bone infection)o No Have you had any tests for circulation on your legso No Genitourinary Complaints and Symptoms: Positive for: Kidney failure/ Dialysis - Transplant April 2016 Medical History: Negative for: End Stage Renal Disease Integumentary (Skin) Complaints and Symptoms: Positive for: Wounds Negative for: Bleeding or bruising tendency; Breakdown; Swelling Medical History: Negative for: History of Burn; History of pressure wounds Musculoskeletal Complaints and Symptoms: No Complaints or Symptoms Complaints and Symptoms: Negative for: Muscle Pain; Muscle Weakness Medical History: Negative for: Gout; Rheumatoid Arthritis; Osteoarthritis; Osteomyelitis Constitutional Symptoms (General Health) Complaints and Symptoms: No Complaints or Symptoms Eyes Complaints and Symptoms: No Complaints or Symptoms KIRBY, CORTESE R. ([id number]) Medical History: Negative for: Cataracts; Glaucoma; Optic Neuritis Ear/Nose/Mouth/Throat Complaints and Symptoms: No Complaints or Symptoms Medical History: Negative for: Chronic sinus problems/congestion; Middle ear problems Hematologic/Lymphatic Complaints and Symptoms: No Complaints or Symptoms Medical History: Negative for: Anemia; Hemophilia; Human Immunodeficiency Virus; Lymphedema; Sickle Cell Disease Respiratory Complaints and Symptoms: No Complaints or Symptoms Medical History: Negative for: Aspiration; Asthma; Chronic Obstructive Pulmonary Disease (COPD); Pneumothorax; Sleep Apnea; Tuberculosis Cardiovascular Complaints and Symptoms: No Complaints or Symptoms Medical History: Positive for: Hypertension Negative for: Angina; Arrhythmia; Congestive Heart Failure; Coronary Artery Disease; Deep Vein Thrombosis; Hypotension; Myocardial Infarction; Peripheral Arterial Disease; Peripheral Venous Disease; Phlebitis;  Vasculitis Gastrointestinal Complaints and Symptoms: No Complaints or Symptoms Medical History: Positive for: Hepatitis B Negative for: Cirrhosis ; Colitis; Crohnos; Hepatitis A; Hepatitis C Endocrine Complaints and Symptoms: No Complaints or Symptoms Medical History: Positive for: Type II Diabetes - 7.4% Negative for: Type I Diabetes Time with diabetes: 30 years Treated with: Insulin Blood sugar tested every day: No Blood sugar testing results: PETROS, AHART ([id number]) Breakfast: 120 Immunological Complaints and Symptoms: No Complaints or Symptoms Medical History: Negative for: Lupus Erythematosus; Raynaudos; Scleroderma Neurologic Complaints and Symptoms: No Complaints or Symptoms Medical History: Negative for: Dementia; Neuropathy; Quadriplegia; Paraplegia; Seizure Disorder Oncologic Complaints  and Symptoms: No Complaints or Symptoms Medical History: Negative for: Received Chemotherapy; Received Radiation Psychiatric Complaints and Symptoms: No Complaints or Symptoms Medical History: Negative for: Anorexia/bulimia; Confinement Anxiety Immunizations Pneumococcal Vaccine: Received Pneumococcal Vaccination: Yes Tetanus Vaccine: Last tetanus shot: 10/24/2016 Implantable Devices Hospitalization / Surgery History Name of Hospital Purpose of Hospitalization/Surgery Date [facility] Kidney transplant 06/24/2016 Family and Social History Current every day smoker - 1/2 pack daily; Marital Status - Married; Alcohol Use: Rarely; Drug Use: No History; Caffeine Use: Daily; Financial Concerns: No; Food, Clothing or Shelter Needs: No; Support System Lacking: No; Transportation Concerns: No; Advanced Directives: Yes (Not Provided); Do not resuscitate: No; Living Will: Yes (Not Provided); Medical Power of Attorney: Yes (Not Provided) Electronic Signature(s) Signed: 10/24/2017 9:54:05 PM By: Lawanda Cousins Signed: 10/25/2017 6:17:48 PM By: Gretta Cool, BSN, RN, CWS, Kim RN,  BSN Entered By: Gretta Cool, BSN, RN, CWS, Kim on 10/24/2017 08:27:30 CASEY, FYE ([id number]) KELLIN, BARTLING ([id number]) -------------------------------------------------------------------------------- [facility] Details Patient Name: CLEMONS, SALVUCCI. Date of Service: 10/24/2017 Medical Record Number: [id number] Patient Account Number: [id number] Date of Birth/Sex: [birth year] (52 y.o. M) Treating RN: Ahmed Prima Primary Care Provider: Lelon Huh Other Clinician: Referring Provider: Lelon Huh Treating Provider/Extender: Lawanda Cousins Weeks in Treatment: 0 Diagnosis Coding ICD-10 Codes Code Description E11.621 Type 2 diabetes mellitus with foot ulcer L97.411 Non-pressure chronic ulcer of right heel and midfoot limited to breakdown of skin F17.219 Nicotine dependence, cigarettes, with unspecified nicotine-induced disorders Facility Procedures CPT4 Code Description: [id number] - WOUND CARE VISIT-LEV 3 EST PT Modifier: Quantity: 1 CPT4 Code Description: [id number] - DEBRIDE WOUND 1ST 20 SQ CM OR < ICD-10 Diagnosis Description L97.411 Non-pressure chronic ulcer of right heel and midfoot limited to Modifier: breakdown of Quantity: 1 skin Physician Procedures CPT4 Code Description: [id number] WC PHYS LEVEL 3 o NEW PT ICD-10 Diagnosis Description L97.411 Non-pressure chronic ulcer of right heel and midfoot limited to E11.621 Type 2 diabetes mellitus with foot ulcer F17.219 Nicotine dependence, cigarettes, with  unspecified nicotine-indu Modifier: breakdown of ced disorders Quantity: 1 skin CPT4 Code Description: [id number] - WC PHYS DEBR WO ANESTH 20 SQ CM ICD-10 Diagnosis Description L97.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

## 2017-11-07 ENCOUNTER — Encounter: Admission: RE | Payer: Self-pay | Source: Ambulatory Visit

## 2017-11-07 ENCOUNTER — Ambulatory Visit: Admission: RE | Admit: 2017-11-07 | Payer: Medicare Other | Source: Ambulatory Visit | Admitting: Vascular Surgery

## 2017-11-07 SURGERY — LOWER EXTREMITY ANGIOGRAPHY
Anesthesia: Moderate Sedation | Laterality: Right

## 2017-11-11 NOTE — Progress Notes (Signed)
RANGEL, ECHEVERRI ([id number]) Visit Report for 10/31/2017 Chief Complaint Document Details Patient Name: James Moreno, James Moreno. Date of Service: 10/31/2017 9:30 AM Medical Record Number: [id number] Patient Account Number: [id number] Date of Birth/Sex: 1965-05-12 (52 y.o. M) Treating RN: Ahmed Prima Primary Care Provider: Lelon Huh Other Clinician: Referring Provider: Lelon Huh Treating Provider/Extender: Cathie Olden in Treatment: 1 Information Obtained from: Patient Chief Complaint right heel wound Electronic Signature(s) Signed: 10/31/2017 10:14:54 AM By: Lawanda Cousins Entered By: Lawanda Cousins on 10/31/2017 10:14:54 James Moreno ([id number]) -------------------------------------------------------------------------------- Debridement Details Patient Name: James Moreno. Date of Service: 10/31/2017 9:30 AM Medical Record Number: [id number] Patient Account Number: [id number] Date of Birth/Sex: 1965-12-17 (52 y.o. M) Treating RN: Ahmed Prima Primary Care Provider: Lelon Huh Other Clinician: Referring Provider: Lelon Huh Treating Provider/Extender: Cathie Olden in Treatment: 1 Debridement Performed for Wound #1 Right,Medial Calcaneus Assessment: Performed By: Physician Lawanda Cousins, NP Debridement Type: Debridement Severity of Tissue Pre Limited to breakdown of skin Debridement: Pre-procedure Verification/Time Yes - 09:52 Out Taken: Start Time: 09:52 Pain Control: Other : lidocaine 4% Total Area Debrided (L x W): 0.3 (cm) x 0.3 (cm) = 0.09 (cm) Tissue and other material Viable, Skin: Dermis , Skin: Epidermis, Fibrin/Exudate debrided: Level: Skin/Epidermis Debridement Description: Selective/Open Wound Instrument: Curette Bleeding: Minimum Hemostasis Achieved: Pressure End Time: 09:55 Procedural Pain: 0 Post Procedural Pain: 0 Response to Treatment: Procedure was tolerated well Level of Consciousness: Awake and Alert Post  Debridement Measurements of Total Wound Length: (cm) 0.3 Width: (cm) 0.3 Depth: (cm) 0.1 Volume: (cm) 0.007 Character of Wound/Ulcer Post Debridement: Requires Further Debridement Severity of Tissue Post Debridement: Limited to breakdown of skin Post Procedure Diagnosis Same as Pre-procedure Electronic Signature(s) Signed: 10/31/2017 10:14:40 AM By: Lawanda Cousins Signed: 10/31/2017 4:46:33 PM By: Alric Quan Entered By: Lawanda Cousins on 10/31/2017 10:14:40 James Moreno ([id number]) -------------------------------------------------------------------------------- HPI Details Patient Name: James Moreno. Date of Service: 10/31/2017 9:30 AM Medical Record Number: [id number] Patient Account Number: [id number] Date of Birth/Sex: Jun 02, 1965 (52 y.o. M) Treating RN: Ahmed Prima Primary Care Provider: Lelon Huh Other Clinician: Referring Provider: Lelon Huh Treating Provider/Extender: Cathie Olden in Treatment: 1 History of Present Illness HPI Description: 10/24/17-He is seen in initial evaluation for a right posterior heel wound. He states approximately 2 months ago he sustained an injury while using a pumice/callus grater on his heels. In the time since that injury he has seen [facility], [facility], Dr. Cleda Mccreedy at [place] [facility], emerge or so, infectious disease, and urgent care. He is currently on no antibiotc therapy but has a history of being on keflex and bactrim (at time of injury) which was discontinued at presentation to [facility] ER (within a week after injury) and initiated on cipro and clindamycin. He did see infectious disease in [place] who recommended a CAT scan, he has not heard back about the CAT scan. He saw Dr. Cleda Mccreedy last week who referred him to [facility] for evaluation. he admits to not offloading, was never instructed to. He is on chronic immunosuppression secondary to a renal transplant 2016. His diabetes is  controlled with an A1c of 7.4 last week. He does admit to pain, relieved without need for medication. He is smoking, with a plan to quit smoking cigarettes by next Monday with individual goal to decrease the amount of nicotine and his vape to 0%. He has been encouraged to expedite the smoking cessation. 10/31/17-He is seen in follow-up evaluation for right posterior heel wound. There  is no deterioration, he remains pinpoint with scant amount of serous drainage. He was evaluated by [facility] yesterday (RIGHT: ABI 1.37, TBI 0.48; LEFT: ABI 1.06, TBI 0.3) with plans for angioplasty next week. We will continue with same treatment plan and he will follow up in two weeks. He was strongly encouraged to have complete smoking cessation prior to or immediately after angioplasty to increase success. Electronic Signature(s) Signed: 10/31/2017 10:17:01 AM By: Lawanda Cousins Entered By: Lawanda Cousins on 10/31/2017 10:17:00 James Moreno ([id number]) -------------------------------------------------------------------------------- Physician Orders Details Patient Name: James Moreno, James Moreno. Date of Service: 10/31/2017 9:30 AM Medical Record Number: [id number] Patient Account Number: [id number] Date of Birth/Sex: 28-Jan-1966 (52 y.o. M) Treating RN: Ahmed Prima Primary Care Provider: Lelon Huh Other Clinician: Referring Provider: Lelon Huh Treating Provider/Extender: Cathie Olden in Treatment: 1 Verbal / Phone Orders: Yes Clinician: Pinkerton, Debi Read Back and Verified: Yes Diagnosis Coding Wound Cleansing Wound #1 Right,Medial Calcaneus o Clean wound with Normal Saline. o Cleanse wound with mild soap and water Anesthetic (add to Medication List) Wound #1 Right,Medial Calcaneus o Topical Lidocaine 4% cream applied to wound bed prior to debridement (In Clinic Only). Primary Wound Dressing Wound #1 Right,Medial Calcaneus o Hydrafera Blue Ready  Transfer Secondary Dressing Wound #1 Right,Medial Calcaneus o [place] Dressing Change Frequency Wound #1 Right,Medial Calcaneus o Change dressing every day. Follow-up Appointments Wound #1 Right,Medial Calcaneus o Return Appointment in 1 week. Off-Loading Wound #1 Right,Medial Calcaneus o Other: - little or no pressure to affected area Additional Orders / Instructions Wound #1 Right,Medial Calcaneus o Increase protein intake. Patient Medications Allergies: No Known Allergies Notifications Medication Indication Start End lidocaine DOSE 1 - topical 4 % cream - 1 cream topical James Moreno, James Moreno ([id number]) Electronic Signature(s) Signed: 10/31/2017 4:46:33 PM By: Alric Quan Signed: 10/31/2017 5:08:35 PM By: Lawanda Cousins Entered By: Alric Quan on 10/31/2017 10:00:01 James Moreno, James Moreno ([id number]) -------------------------------------------------------------------------------- Prescription 10/31/2017 Patient Name: James Moreno. Provider: Lawanda Cousins NP Date of Birth: 1965-10-25 NPI#: [id number] Sex: Jerilynn Mages DEA#: [id number] Phone #: [phone] License #: Patient Address: [facility] [place], [place] [address] [address] [place], [place] [address] [phone] Allergies No Known Allergies Medication Medication: Route: Strength: Form: lidocaine 4 % topical cream topical 4% cream Class: TOPICAL LOCAL ANESTHETICS Dose: Frequency / Time: Indication: 1 1 cream topical Number of Refills: Number of Units: 0 Generic Substitution: Start Date: End Date: One Time Use: Substitution Permitted No Note to Pharmacy: Signature(s): Date(s): Electronic Signature(s) Signed: 10/31/2017 4:46:33 PM By: Alric Quan Signed: 10/31/2017 5:08:35 PM By: Lawanda Cousins Entered By: Alric Quan on 10/31/2017 10:00:02 James Moreno  ([id number]) --------------------------------------------------------------------------------  Problem List Details Patient Name: James Moreno, James Moreno. Date of Service: 10/31/2017 9:30 AM Medical Record Number: [id number] Patient Account Number: [id number] Date of Birth/Sex: 1965-03-29 (52 y.o. M) Treating RN: Ahmed Prima Primary Care Provider: Lelon Huh Other Clinician: Referring Provider: Lelon Huh Treating Provider/Extender: Cathie Olden in Treatment: 1 Active Problems ICD-10 Evaluated Encounter Code Description Active Date Today Diagnosis L97.411 Non-pressure chronic ulcer of right heel and midfoot limited 10/24/2017 No Yes to breakdown of skin E11.621 Type 2 diabetes mellitus with foot ulcer 10/24/2017 No Yes F17.219 Nicotine dependence, cigarettes, with unspecified nicotine- 10/24/2017 No Yes induced disorders I70.234 Atherosclerosis of native arteries of right leg with ulceration of 10/31/2017 No Yes heel and midfoot Inactive Problems Resolved Problems Electronic Signature(s) Signed: 10/31/2017 10:13:05 AM  By: Lawanda Cousins Entered By: Lawanda Cousins on 10/31/2017 10:13:04 James Moreno ([id number]) -------------------------------------------------------------------------------- Progress Note Details Patient Name: James Moreno, James Moreno. Date of Service: 10/31/2017 9:30 AM Medical Record Number: [id number] Patient Account Number: [id number] Date of Birth/Sex: [birth year] (52 y.o. M) Treating RN: Ahmed Prima Primary Care Provider: Lelon Huh Other Clinician: Referring Provider: Lelon Huh Treating Provider/Extender: Cathie Olden in Treatment: 1 Subjective Chief Complaint Information obtained from Patient right heel wound History of Present Illness (HPI) 10/24/17-He is seen in initial evaluation for a right posterior heel wound. He states approximately 2 months ago he sustained an injury while using a pumice/callus grater on his heels. In the time  since that injury he has seen [facility], [facility], Dr. Cleda Mccreedy at [facility], [facility] or so, infectious disease, and urgent care. He is currently on no antibiotc therapy but has a history of being on keflex and bactrim (at time of injury) which was discontinued at presentation to [facility] ER (within a week after injury) and initiated on cipro and clindamycin. He did see infectious disease in [place] who recommended a CAT scan, he has not heard back about the CAT scan. He saw Dr. Cleda Mccreedy last week who referred him to [facility] for evaluation. he admits to not offloading, was never instructed to. He is on chronic immunosuppression secondary to a renal transplant 2016. His diabetes is controlled with an A1c of 7.4 last week. He does admit to pain, relieved without need for medication. He is smoking, with a plan to quit smoking cigarettes by next Monday with individual goal to decrease the amount of nicotine and his vape to 0%. He has been encouraged to expedite the smoking cessation. 10/31/17-He is seen in follow-up evaluation for right posterior heel wound. There is no deterioration, he remains pinpoint with scant amount of serous drainage. He was evaluated by [facility] yesterday (RIGHT: ABI 1.37, TBI 0.48; LEFT: ABI 1.06, TBI 0.3) with plans for angioplasty next week. We will continue with same treatment plan and he will follow up in two weeks. He was strongly encouraged to have complete smoking cessation prior to or immediately after angioplasty to increase success. Patient History Information obtained from Patient. Social History Current every day smoker - 1/2 pack daily, Marital Status - Married, Alcohol Use - Rarely, Drug Use - No History, Caffeine Use - Daily. Medical History Hospitalization/Surgery History - 06/24/2016, [place], Kidney transplant. Review of Systems (ROS) Constitutional Symptoms (General Health) Denies complaints or  symptoms of Fatigue, Fever, Chills. Respiratory Denies complaints or symptoms of Shortness of Breath. Cardiovascular Denies complaints or symptoms of Chest pain, LE edema. James Moreno, James Moreno ([id number]) Objective Constitutional Vitals Time Taken: 9:40 AM, Height: 71 in, Weight: 235 lbs, BMI: 32.8, Temperature: 98.0 F, Pulse: 96 bpm, Respiratory Rate: 16 breaths/min, Blood Pressure: 142/70 mmHg. Integumentary (Hair, Skin) Wound #1 status is Open. Original cause of wound was Trauma. The wound is located on the Right,Medial Calcaneus. The wound measures 0.3cm length x 0.3cm width x 0.1cm depth; 0.071cm^2 area and 0.007cm^3 volume. There is no tunneling or undermining noted. There is a none present amount of drainage noted. The wound margin is indistinct and nonvisible. There is no granulation within the wound bed. There is a large (67-100%) amount of necrotic tissue within the wound bed including Eschar. The periwound skin appearance did not exhibit: Callus, Crepitus, Excoriation, Induration, Rash, Scarring, Dry/Scaly, Maceration, Atrophie Blanche, Cyanosis, Ecchymosis, Hemosiderin Staining, Mottled, Pallor, Rubor, Erythema. Periwound temperature was  noted as No Abnormality. The periwound has tenderness on palpation. Assessment Active Problems ICD-10 Non-pressure chronic ulcer of right heel and midfoot limited to breakdown of skin Type 2 diabetes mellitus with foot ulcer Nicotine dependence, cigarettes, with unspecified nicotine-induced disorders Atherosclerosis of native arteries of right leg with ulceration of heel and midfoot Procedures Wound #1 Pre-procedure diagnosis of Wound #1 is a Diabetic Wound/Ulcer of the Lower Extremity located on the Right,Medial Calcaneus .Severity of Tissue Pre Debridement is: Limited to breakdown of skin. There was a Selective/Open Wound Skin/Epidermis Debridement with a total area of 0.09 sq cm performed by Lawanda Cousins, NP. With the following  instrument(s): Curette to remove Viable tissue/material. Material removed includes Skin: Dermis, Skin: Epidermis, and Fibrin/Exudate after achieving pain control using Other (lidocaine 4%). No specimens were taken. A time out was conducted at 09:52, prior to the start of the procedure. A Minimum amount of bleeding was controlled with Pressure. The procedure was tolerated well with a pain level of 0 throughout and a pain level of 0 following the procedure. Patient s Level of Consciousness post procedure was recorded as Awake and Alert. Post Debridement Measurements: 0.3cm length x 0.3cm width x 0.1cm depth; 0.007cm^3 volume. Character of Wound/Ulcer Post Debridement requires further debridement. Severity of Tissue Post Debridement is: Limited to breakdown of skin. Post procedure Diagnosis Wound #1: Same as Pre-Procedure James Moreno, James Moreno. ([id number]) Plan Wound Cleansing: Wound #1 Right,Medial Calcaneus: Clean wound with Normal Saline. Cleanse wound with mild soap and water Anesthetic (add to Medication List): Wound #1 Right,Medial Calcaneus: Topical Lidocaine 4% cream applied to wound bed prior to debridement (In Clinic Only). Primary Wound Dressing: Wound #1 Right,Medial Calcaneus: Hydrafera Blue Ready Transfer Secondary Dressing: Wound #1 Right,Medial Calcaneus: [place] Dressing Change Frequency: Wound #1 Right,Medial Calcaneus: Change dressing every day. Follow-up Appointments: Wound #1 Right,Medial Calcaneus: Return Appointment in 1 week. Off-Loading: Wound #1 Right,Medial Calcaneus: Other: - little or no pressure to affected area Additional Orders / Instructions: Wound #1 Right,Medial Calcaneus: Increase protein intake. The following medication(s) was prescribed: lidocaine topical 4 % cream 1 1 cream topical was prescribed at facility Electronic Signature(s) Signed: 10/31/2017 10:17:48 AM By: Lawanda Cousins Entered By: Lawanda Cousins on 10/31/2017 10:17:48 James Moreno ([id number]) -------------------------------------------------------------------------------- ROS/PFSH Details Patient Name: James Moreno. Date of Service: 10/31/2017 9:30 AM Medical Record Number: [id number] Patient Account Number: [id number] Date of Birth/Sex: 09/19/1965 (52 y.o. M) Treating RN: Ahmed Prima Primary Care Provider: Lelon Huh Other Clinician: Referring Provider: Lelon Huh Treating Provider/Extender: Cathie Olden in Treatment: 1 Information Obtained From Patient Wound History Do you currently have one or more open woundso Yes How many open wounds do you currently haveo 1 Approximately how long have you had your woundso 2 months How have you been treating your wound(s) until nowo no dressing Has your wound(s) ever healed and then re-openedo No Have you tested positive for an antibiotic resistant organism (MRSA, VRE)o No Have you tested positive for osteomyelitis (bone infection)o No Have you had any tests for circulation on your legso No Constitutional Symptoms (General Health) Complaints and Symptoms: Negative for: Fatigue; Fever; Chills Respiratory Complaints and Symptoms: Negative for: Shortness of Breath Medical History: Negative for: Aspiration; Asthma; Chronic Obstructive Pulmonary Disease (COPD); Pneumothorax; Sleep Apnea; Tuberculosis Cardiovascular Complaints and Symptoms: Negative for: Chest pain; LE edema Medical History: Positive for: Hypertension Negative for: Angina; Arrhythmia; Congestive Heart Failure; Coronary Artery Disease; Deep Vein Thrombosis; Hypotension; Myocardial Infarction; Peripheral Arterial Disease; Peripheral Venous Disease; Phlebitis; Vasculitis Eyes  Medical History: Negative for: Cataracts; Glaucoma; Optic Neuritis Ear/Nose/Mouth/Throat Medical History: Negative for: Chronic sinus problems/congestion; Middle ear problems Hematologic/Lymphatic James Moreno, James Moreno ([id number]) Medical History: Negative  for: Anemia; Hemophilia; Human Immunodeficiency Virus; Lymphedema; Sickle Cell Disease Gastrointestinal Medical History: Positive for: Hepatitis B Negative for: Cirrhosis ; Colitis; Crohnos; Hepatitis A; Hepatitis C Endocrine Medical History: Positive for: Type II Diabetes - 7.4% Negative for: Type I Diabetes Time with diabetes: 30 years Treated with: Insulin Blood sugar tested every day: No Blood sugar testing results: Breakfast: 120 Genitourinary Medical History: Negative for: End Stage Renal Disease Immunological Medical History: Negative for: Lupus Erythematosus; Raynaudos; Scleroderma Integumentary (Skin) Medical History: Negative for: History of Burn; History of pressure wounds Musculoskeletal Medical History: Negative for: Gout; Rheumatoid Arthritis; Osteoarthritis; Osteomyelitis Neurologic Medical History: Negative for: Dementia; Neuropathy; Quadriplegia; Paraplegia; Seizure Disorder Oncologic Medical History: Negative for: Received Chemotherapy; Received Radiation Psychiatric Medical History: Negative for: Anorexia/bulimia; Confinement Anxiety Immunizations Pneumococcal Vaccine: Received Pneumococcal Vaccination: Yes James Moreno, James Moreno ([id number]) Tetanus Vaccine: Last tetanus shot: 10/24/2016 Implantable Devices Hospitalization / Surgery History Name of Hospital Purpose of Hospitalization/Surgery Date [facility] Kidney transplant 06/24/2016 Family and Social History Current every day smoker - 1/2 pack daily; Marital Status - Married; Alcohol Use: Rarely; Drug Use: No History; Caffeine Use: Daily; Financial Concerns: No; Food, Clothing or Shelter Needs: No; Support System Lacking: No; Transportation Concerns: No; Advanced Directives: Yes (Not Provided); Patient does not want information on Advanced Directives; Do not resuscitate: No; Living Will: Yes (Not Provided); Medical Power of Attorney: Yes (Not Provided) Physician Affirmation I have reviewed and agree  with the above information. Electronic Signature(s) Signed: 10/31/2017 4:46:33 PM By: Alric Quan Signed: 10/31/2017 5:08:35 PM By: Lawanda Cousins Entered By: Lawanda Cousins on 10/31/2017 10:17:36 James Moreno ([id number]) -------------------------------------------------------------------------------- [place] Details Patient Name: James Moreno, James Moreno. Date of Service: 10/31/2017 Medical Record Number: [id number] Patient Account Number: [id number] Date of Birth/Sex: February 22, 1966 (52 y.o. M) Treating RN: Ahmed Prima Primary Care Provider: Lelon Huh Other Clinician: Referring Provider: Lelon Huh Treating Provider/Extender: Lawanda Cousins Weeks in Treatment: 1 Diagnosis Coding ICD-10 Codes Code Description L97.411 Non-pressure chronic ulcer of right heel and midfoot limited to breakdown of skin E11.621 Type 2 diabetes mellitus with foot ulcer F17.219 Nicotine dependence, cigarettes, with unspecified nicotine-induced disorders I70.234 Atherosclerosis of native arteries of right leg with ulceration of heel and midfoot Facility Procedures CPT4 Code Description: [id number] - DEBRIDE WOUND 1ST 20 SQ CM OR < ICD-10 Diagnosis Description L97.411 Non-pressure chronic ulcer of right heel and midfoot limited to br I70.234 Atherosclerosis of native arteries of right leg with ulceration of Modifier: eakdown of sk heel and mid Quantity: 1 in foot Physician Procedures CPT4 Code Description: [id number] - WC PHYS DEBR WO ANESTH 20 SQ CM ICD-10 Diagnosis Description L97.411 Non-pressure chronic ulcer of right heel and midfoot limited to br I70.234 Atherosclerosis of native arteries of right leg with ulceration of Modifier: eakdown of sk heel and mid Quantity: 1 in foot Electronic Signature(s) Signed: 10/31/2017 10:18:02 AM By: Lawanda Cousins Entered By: Lawanda Cousins on 10/31/2017 10:18:02

## 2017-11-11 NOTE — Progress Notes (Signed)
James, Moreno ([id number]) Visit Report for 10/31/2017 Arrival Information Details Patient Name: James Moreno, James Moreno. Date of Service: 10/31/2017 9:30 AM Medical Record Number: [id number] Patient Account Number: [id number] Date of Birth/Sex: 1966-03-19 (51 y.o. M) Treating RN: Montey Hora Primary Care Hoorain Kozakiewicz: Lelon Huh Other Clinician: Referring Mahima Hottle: Lelon Huh Treating Huan Pollok/Extender: Cathie Olden in Treatment: 1 Visit Information History Since Last Visit Added or deleted any medications: No Patient Arrived: Ambulatory Any new allergies or adverse reactions: No Arrival Time: 09:38 Had a fall or experienced change in No Accompanied By: self activities of daily living that may affect Transfer Assistance: None risk of falls: Patient Identification Verified: Yes Signs or symptoms of abuse/neglect since last visito No Secondary Verification Process Yes Hospitalized since last visit: No Completed: Implantable device outside of the clinic excluding No Patient Has Alerts: Yes cellular tissue based products placed in the center Patient Alerts: Type II Diabetic since last visit: Hep B + Has Dressing in Place as Prescribed: Yes 10/30/17 ABI R 1.37 L Pain Present Now: No 1.06 TBI R 0.48 L 0.30 Electronic Signature(s) Signed: 10/31/2017 9:50:56 AM By: Montey Hora Entered By: Montey Hora on 10/31/2017 09:50:56 James Moreno ([id number]) -------------------------------------------------------------------------------- Encounter Discharge Information Details Patient Name: James Moreno. Date of Service: 10/31/2017 9:30 AM Medical Record Number: [id number] Patient Account Number: [id number] Date of Birth/Sex: [birth year] (51 y.o. M) Treating RN: Roger Shelter Primary Care Catelyn Friel: Lelon Huh Other Clinician: Referring Dailon Sheeran: Lelon Huh Treating Albion Weatherholtz/Extender: Cathie Olden in Treatment: 1 Encounter Discharge Information  Items Discharge Condition: Stable Ambulatory Status: Ambulatory Discharge Destination: Home Transportation: Private Auto Schedule Follow-up Appointment: No Clinical Summary of Care: Electronic Signature(s) Signed: 10/31/2017 11:54:11 AM By: Roger Shelter Entered By: Roger Shelter on 10/31/2017 10:10:41 James Moreno ([id number]) -------------------------------------------------------------------------------- Lower Extremity Assessment Details Patient Name: James, Moreno. Date of Service: 10/31/2017 9:30 AM Medical Record Number: [id number] Patient Account Number: [id number] Date of Birth/Sex: 1965/11/24 (51 y.o. M) Treating RN: Montey Hora Primary Care Brance Dartt: Lelon Huh Other Clinician: Referring Shakaria Raphael: Lelon Huh Treating Shellye Zandi/Extender: Cathie Olden in Treatment: 1 Vascular Assessment Pulses: Dorsalis Pedis Palpable: [Right:Yes] Posterior Tibial Extremity colors, hair growth, and conditions: Extremity Color: [Right:Normal] Hair Growth on Extremity: [Right:Yes] Temperature of Extremity: [Right:Warm] Capillary Refill: [Right:< 3 seconds] Toe Nail Assessment Left: Right: Thick: Yes Discolored: Yes Deformed: No Improper Length and Hygiene: Yes Electronic Signature(s) Signed: 10/31/2017 4:27:39 PM By: Montey Hora Entered By: Montey Hora on 10/31/2017 09:43:37 James Moreno ([id number]) -------------------------------------------------------------------------------- Multi Wound Chart Details Patient Name: James Moreno. Date of Service: 10/31/2017 9:30 AM Medical Record Number: [id number] Patient Account Number: [id number] Date of Birth/Sex: 07/23/1965 (51 y.o. M) Treating RN: Ahmed Prima Primary Care Ivaan Liddy: Lelon Huh Other Clinician: Referring Carston Riedl: Lelon Huh Treating Katsumi Wisler/Extender: Cathie Olden in Treatment: 1 Vital Signs Height(in): 71 Pulse(bpm): 96 Weight(lbs): 235 Blood Pressure(mmHg):  142/70 Body Mass Index(BMI): 33 Temperature(F): 98.0 Respiratory Rate 16 (breaths/min): Photos: [1:No Photos] [N/A:N/A] Wound Location: [1:Right Calcaneus - Medial] [N/A:N/A] Wounding Event: [1:Trauma] [N/A:N/A] Primary Etiology: [1:Diabetic Wound/Ulcer of the Lower Extremity] [N/A:N/A] Comorbid History: [1:Hypertension, Hepatitis B, Type II Diabetes] [N/A:N/A] Date Acquired: [1:08/26/2017] [N/A:N/A] Weeks of Treatment: [1:1] [N/A:N/A] Wound Status: [1:Open] [N/A:N/A] Measurements L x W x D [1:0.3x0.3x0.1] [N/A:N/A] (cm) Area (cm) : [1:0.071] [N/A:N/A] Volume (cm) : [1:0.007] [N/A:N/A] % Reduction in Area: [1:92.50%] [N/A:N/A] % Reduction in Volume: [1:92.60%] [N/A:N/A] Classification: [1:Unable to visualize wound bed N/A] Exudate Amount: [1:None Present] [N/A:N/A] Wound Margin: [1:Indistinct, nonvisible] [N/A:N/A] Granulation Amount: [1:None Present (  0%)] [N/A:N/A] Necrotic Amount: [1:Large (67-100%)] [N/A:N/A] Necrotic Tissue: [1:Eschar] [N/A:N/A] Exposed Structures: [1:Fascia: No Fat Layer (Subcutaneous Tissue) Exposed: No Tendon: No Muscle: No Joint: No Bone: No] [N/A:N/A] Epithelialization: [1:None] [N/A:N/A] Debridement: [1:Debridement - Excisional] [N/A:N/A] Pre-procedure [1:09:52] [N/A:N/A] Verification/Time Out Taken: Pain Control: [1:Other] [N/A:N/A] Tissue Debrided: [1:Subcutaneous] [N/A:N/A] Level: [1:Skin/Subcutaneous Tissue] [N/A:N/A] Debridement Area (sq cm): [1:0.09] [N/A:N/A] Instrument: Curette N/A N/A Bleeding: Minimum N/A N/A Hemostasis Achieved: Pressure N/A N/A Procedural Pain: 0 N/A N/A Post Procedural Pain: 0 N/A N/A Debridement Treatment Procedure was tolerated well N/A N/A Response: Post Debridement 0.6x0.4x0.2 N/A N/A Measurements L x W x D (cm) Post Debridement Volume: 0.038 N/A N/A (cm) Periwound Skin Texture: Excoriation: No N/A N/A Induration: No Callus: No Crepitus: No Rash: No Scarring: No Periwound Skin  Moisture: Maceration: No N/A N/A Dry/Scaly: No Periwound Skin Color: Atrophie Blanche: No N/A N/A Cyanosis: No Ecchymosis: No Erythema: No Hemosiderin Staining: No Mottled: No Pallor: No Rubor: No Temperature: No Abnormality N/A N/A Tenderness on Palpation: Yes N/A N/A Wound Preparation: Ulcer Cleansing: N/A N/A Rinsed/Irrigated with Saline Topical Anesthetic Applied: Other: lidocaiine 4% Procedures Performed: Debridement N/A N/A Treatment Notes Wound #1 (Right, Medial Calcaneus) 1. Cleansed with: Clean wound with Normal Saline 2. Anesthetic Topical Lidocaine 4% cream to wound bed prior to debridement 3. Peri-wound Care: Skin Prep 4. Dressing Applied: Hydrafera Blue 5. Secondary [place] Signature(s) Signed: 10/31/2017 10:13:16 AM By: Lawanda Cousins Entered By: Lawanda Cousins on 10/31/2017 10:13:16 DEMARIS, LEAVELL ([id number]) MALEKE, FERIA ([id number]) -------------------------------------------------------------------------------- [place] Details Patient Name: BRAD, LIEURANCE. Date of Service: 10/31/2017 9:30 AM Medical Record Number: [id number] Patient Account Number: [id number] Date of Birth/Sex: [birth year] (51 y.o. M) Treating RN: Ahmed Prima Primary Care Kiandre Spagnolo: Lelon Huh Other Clinician: Referring Shantaya Bluestone: Lelon Huh Treating Jaran Sainz/Extender: Cathie Olden in Treatment: 1 Active Inactive ` Nutrition Nursing Diagnoses: Imbalanced nutrition Impaired glucose control: actual or potential Potential for alteratiion in Nutrition/Potential for imbalanced nutrition Goals: Patient/caregiver agrees to and verbalizes understanding of need to use nutritional supplements and/or vitamins as prescribed Date Initiated: 10/24/2017 Target Resolution Date: 03/01/2018 Goal Status: Active Patient/caregiver will maintain therapeutic glucose control Date Initiated: 10/24/2017 Target Resolution Date:  03/01/2018 Goal Status: Active Interventions: Assess patient nutrition upon admission and as needed per policy Provide education on elevated blood sugars and impact on wound healing Provide education on nutrition Notes: ` Orientation to the Wound Care Program Nursing Diagnoses: Knowledge deficit related to the wound healing center program Goals: Patient/caregiver will verbalize understanding of the [place] Program Date Initiated: 10/24/2017 Target Resolution Date: 11/30/2017 Goal Status: Active Interventions: Provide education on orientation to the wound center Notes: ` Wound/Skin Impairment Nursing Diagnoses: YAIDEN, YANG ([id number]) Impaired tissue integrity Knowledge deficit related to smoking impact on wound healing Knowledge deficit related to ulceration/compromised skin integrity Goals: Ulcer/skin breakdown will have a volume reduction of 80% by week 12 Date Initiated: 10/24/2017 Target Resolution Date: 02/22/2018 Goal Status: Active Interventions: Assess patient/caregiver ability to perform ulcer/skin care regimen upon admission and as needed Assess ulceration(s) every visit Notes: Electronic Signature(s) Signed: 10/31/2017 4:46:33 PM By: Alric Quan Entered By: Alric Quan on 10/31/2017 09:51:59 James Moreno ([id number]) -------------------------------------------------------------------------------- Pain Assessment Details Patient Name: James Moreno. Date of Service: 10/31/2017 9:30 AM Medical Record Number: [id number] Patient Account Number: [id number] Date of Birth/Sex: 08-07-1965 (51 y.o. M) Treating RN: Montey Hora Primary Care Sora Olivo: Lelon Huh Other Clinician: Referring Gunnard Dorrance: Lelon Huh Treating Josselyn Harkins/Extender: Lawanda Cousins Weeks in Treatment:  1 Active Problems Location of Pain Severity and Description of Pain Patient Has Paino Yes Site Locations Pain Location: Pain in Ulcers With Dressing Change:  Yes Duration of the Pain. Constant / Intermittento Intermittent Character of Pain Describe the Pain: Other: tender Pain Management and Medication Current Pain Management: Electronic Signature(s) Signed: 10/31/2017 4:27:39 PM By: Montey Hora Entered By: Montey Hora on 10/31/2017 09:40:08 James Moreno ([id number]) -------------------------------------------------------------------------------- Patient/Caregiver Education Details Patient Name: James Moreno. Date of Service: 10/31/2017 9:30 AM Medical Record Number: [id number] Patient Account Number: [id number] Date of Birth/Gender: November 28, 1965 (52 y.o. M) Treating RN: Roger Shelter Primary Care Physician: Lelon Huh Other Clinician: Referring Physician: Lelon Huh Treating Physician/Extender: Cathie Olden in Treatment: 1 Education Assessment Education Provided To: Patient Education Topics Provided Wound Debridement: Handouts: Wound Debridement Methods: Explain/Verbal Responses: State content correctly Wound/Skin Impairment: Handouts: Caring for Your Ulcer Methods: Explain/Verbal Responses: State content correctly Electronic Signature(s) Signed: 10/31/2017 11:54:11 AM By: Roger Shelter Entered By: Roger Shelter on 10/31/2017 10:11:01 James Moreno ([id number]) -------------------------------------------------------------------------------- Wound Assessment Details Patient Name: James Moreno. Date of Service: 10/31/2017 9:30 AM Medical Record Number: [id number] Patient Account Number: [id number] Date of Birth/Sex: 1965/10/30 (51 y.o. M) Treating RN: Montey Hora Primary Care Nkosi Cortright: Lelon Huh Other Clinician: Referring Ray Gervasi: Lelon Huh Treating Ashantee Deupree/Extender: Lawanda Cousins Weeks in Treatment: 1 Wound Status Wound Number: 1 Primary Etiology: Diabetic Wound/Ulcer of the Lower Extremity Wound Location: Right Calcaneus - Medial Wound Status: Open Wounding Event:  Trauma Comorbid Hypertension, Hepatitis B, Type II Diabetes Date Acquired: 08/26/2017 History: Weeks Of Treatment: 1 Clustered Wound: No Photos Photo Uploaded By: Montey Hora on 10/31/2017 10:49:03 Wound Measurements Length: (cm) 0.3 Width: (cm) 0.3 Depth: (cm) 0.1 Area: (cm) 0.071 Volume: (cm) 0.007 % Reduction in Area: 92.5% % Reduction in Volume: 92.6% Epithelialization: None Tunneling: No Undermining: No Wound Description Classification: Unable to visualize wound bed Wound Margin: Indistinct, nonvisible Exudate Amount: None Present Foul Odor After Cleansing: No Slough/Fibrino Yes Wound Bed Granulation Amount: None Present (0%) Exposed Structure Necrotic Amount: Large (67-100%) Fascia Exposed: No Necrotic Quality: Eschar Fat Layer (Subcutaneous Tissue) Exposed: No Tendon Exposed: No Muscle Exposed: No Joint Exposed: No Bone Exposed: No Periwound Skin Texture Texture Color No Abnormalities Noted: No No Abnormalities Noted: No KROSBY, RITCHIE. ([id number]) Callus: No Atrophie Blanche: No Crepitus: No Cyanosis: No Excoriation: No Ecchymosis: No Induration: No Erythema: No Rash: No Hemosiderin Staining: No Scarring: No Mottled: No Pallor: No Moisture Rubor: No No Abnormalities Noted: No Dry / Scaly: No Temperature / Pain Maceration: No Temperature: No Abnormality Tenderness on Palpation: Yes Wound Preparation Ulcer Cleansing: Rinsed/Irrigated with Saline Topical Anesthetic Applied: Other: lidocaiine 4%, Treatment Notes Wound #1 (Right, Medial Calcaneus) 1. Cleansed with: Clean wound with Normal Saline 2. Anesthetic Topical Lidocaine 4% cream to wound bed prior to debridement 3. Peri-wound Care: Skin Prep 4. Dressing Applied: Hydrafera Blue 5. Secondary [place] Signature(s) Signed: 10/31/2017 4:27:39 PM By: Montey Hora Entered By: Montey Hora on 10/31/2017 09:41:59 James Moreno  ([id number]) -------------------------------------------------------------------------------- [place] Details Patient Name: DAVIDMICHAEL, ZARAZUA. Date of Service: 10/31/2017 9:30 AM Medical Record Number: [id number] Patient Account Number: [id number] Date of Birth/Sex: July 09, 1965 (51 y.o. M) Treating RN: Montey Hora Primary Care Fortune Brannigan: Lelon Huh Other Clinician: Referring Eyob Godlewski: Lelon Huh Treating Markea Ruzich/Extender: Cathie Olden in Treatment: 1 Vital Signs Time Taken: 09:40 Temperature (F): 98.0 Height (in): 71 Pulse (bpm): 96 Weight (lbs): 235 Respiratory Rate (breaths/min): 16 Body Mass Index (BMI): 32.8 Blood Pressure (  mmHg): 142/70 Reference Range: 80 - 120 mg / dl Electronic Signature(s) Signed: 10/31/2017 4:27:39 PM By: Montey Hora Entered By: Montey Hora on 10/31/2017 09:40:41

## 2017-11-13 ENCOUNTER — Other Ambulatory Visit: Payer: Self-pay | Admitting: Family Medicine

## 2017-11-13 DIAGNOSIS — L905 Scar conditions and fibrosis of skin: Secondary | ICD-10-CM

## 2017-11-13 DIAGNOSIS — M79604 Pain in right leg: Secondary | ICD-10-CM

## 2017-11-13 DIAGNOSIS — R52 Pain, unspecified: Secondary | ICD-10-CM

## 2017-11-13 MED ORDER — OXYCODONE HCL 5 MG PO TABS: 5.0000 mg | ORAL_TABLET | Freq: Four times a day (QID) | ORAL | 0 refills | Status: DC | PRN

## 2017-11-13 NOTE — Telephone Encounter (Signed)
Pt contacted office for refill request on the following medications:  oxyCODONE (OXY IR/ROXICODONE) 5 MG immediate release tablet  [facility] [address]  Last Rx: 10/16/17 LOV: 07/03/17 Please advise. Thanks TNP

## 2017-11-14 ENCOUNTER — Ambulatory Visit: Payer: Medicare Other | Admitting: Nurse Practitioner

## 2017-11-14 DIAGNOSIS — E1159 Type 2 diabetes mellitus with other circulatory complications: Secondary | ICD-10-CM | POA: Diagnosis not present

## 2017-11-14 DIAGNOSIS — L97411 Non-pressure chronic ulcer of right heel and midfoot limited to breakdown of skin: Secondary | ICD-10-CM | POA: Diagnosis not present

## 2017-12-09 DIAGNOSIS — I739 Peripheral vascular disease, unspecified: Secondary | ICD-10-CM | POA: Diagnosis not present

## 2017-12-09 DIAGNOSIS — Z720 Tobacco use: Secondary | ICD-10-CM | POA: Diagnosis not present

## 2017-12-09 DIAGNOSIS — I6523 Occlusion and stenosis of bilateral carotid arteries: Secondary | ICD-10-CM | POA: Diagnosis not present

## 2017-12-10 ENCOUNTER — Other Ambulatory Visit: Payer: Self-pay | Admitting: Family Medicine

## 2017-12-10 DIAGNOSIS — M79604 Pain in right leg: Secondary | ICD-10-CM

## 2017-12-10 DIAGNOSIS — R52 Pain, unspecified: Secondary | ICD-10-CM

## 2017-12-10 DIAGNOSIS — L905 Scar conditions and fibrosis of skin: Secondary | ICD-10-CM

## 2017-12-10 NOTE — Telephone Encounter (Signed)
Pt needs a refill on his pain medication   Oxycodone  5 mg  [place]  [facility]

## 2017-12-11 DIAGNOSIS — A630 Anogenital (venereal) warts: Secondary | ICD-10-CM | POA: Diagnosis not present

## 2017-12-11 DIAGNOSIS — R208 Other disturbances of skin sensation: Secondary | ICD-10-CM | POA: Diagnosis not present

## 2017-12-11 DIAGNOSIS — R202 Paresthesia of skin: Secondary | ICD-10-CM | POA: Diagnosis not present

## 2017-12-11 DIAGNOSIS — D2261 Melanocytic nevi of right upper limb, including shoulder: Secondary | ICD-10-CM | POA: Diagnosis not present

## 2017-12-11 DIAGNOSIS — D2262 Melanocytic nevi of left upper limb, including shoulder: Secondary | ICD-10-CM | POA: Diagnosis not present

## 2017-12-11 DIAGNOSIS — D2272 Melanocytic nevi of left lower limb, including hip: Secondary | ICD-10-CM | POA: Diagnosis not present

## 2017-12-11 DIAGNOSIS — L0889 Other specified local infections of the skin and subcutaneous tissue: Secondary | ICD-10-CM | POA: Diagnosis not present

## 2017-12-11 DIAGNOSIS — Z85828 Personal history of other malignant neoplasm of skin: Secondary | ICD-10-CM | POA: Diagnosis not present

## 2017-12-11 DIAGNOSIS — L538 Other specified erythematous conditions: Secondary | ICD-10-CM | POA: Diagnosis not present

## 2017-12-11 DIAGNOSIS — L820 Inflamed seborrheic keratosis: Secondary | ICD-10-CM | POA: Diagnosis not present

## 2017-12-11 MED ORDER — OXYCODONE HCL 5 MG PO TABS: 5.0000 mg | ORAL_TABLET | Freq: Four times a day (QID) | ORAL | 0 refills | Status: DC | PRN

## 2017-12-13 ENCOUNTER — Telehealth: Payer: Self-pay | Admitting: Family Medicine

## 2017-12-13 NOTE — Telephone Encounter (Signed)
I left a message asking the pt to call me at [phone] to schedule AWV-S w/ [facility]. Last AWV 08/24/14. VDM (DD)

## 2017-12-17 DIAGNOSIS — E113592 Type 2 diabetes mellitus with proliferative diabetic retinopathy without macular edema, left eye: Secondary | ICD-10-CM | POA: Diagnosis not present

## 2017-12-31 ENCOUNTER — Other Ambulatory Visit: Payer: Self-pay | Admitting: Cardiovascular Disease

## 2018-01-02 ENCOUNTER — Ambulatory Visit (INDEPENDENT_AMBULATORY_CARE_PROVIDER_SITE_OTHER): Payer: Medicare Other

## 2018-01-02 ENCOUNTER — Ambulatory Visit (INDEPENDENT_AMBULATORY_CARE_PROVIDER_SITE_OTHER): Payer: Medicare Other | Admitting: Family Medicine

## 2018-01-02 VITALS — BP 130/70

## 2018-01-02 VITALS — BP 148/74 | HR 95 | Temp 97.9°F | Ht 71.0 in | Wt 238.6 lb

## 2018-01-02 DIAGNOSIS — I151 Hypertension secondary to other renal disorders: Secondary | ICD-10-CM

## 2018-01-02 DIAGNOSIS — I779 Disorder of arteries and arterioles, unspecified: Secondary | ICD-10-CM

## 2018-01-02 DIAGNOSIS — Z125 Encounter for screening for malignant neoplasm of prostate: Secondary | ICD-10-CM | POA: Diagnosis not present

## 2018-01-02 DIAGNOSIS — F1190 Opioid use, unspecified, uncomplicated: Secondary | ICD-10-CM

## 2018-01-02 DIAGNOSIS — I739 Peripheral vascular disease, unspecified: Secondary | ICD-10-CM

## 2018-01-02 DIAGNOSIS — Z940 Kidney transplant status: Secondary | ICD-10-CM

## 2018-01-02 DIAGNOSIS — N2889 Other specified disorders of kidney and ureter: Secondary | ICD-10-CM | POA: Diagnosis not present

## 2018-01-02 DIAGNOSIS — E781 Pure hyperglyceridemia: Secondary | ICD-10-CM | POA: Diagnosis not present

## 2018-01-02 DIAGNOSIS — I70234 Atherosclerosis of native arteries of right leg with ulceration of heel and midfoot: Secondary | ICD-10-CM

## 2018-01-02 DIAGNOSIS — Z0000 Encounter for general adult medical examination without abnormal findings: Secondary | ICD-10-CM | POA: Diagnosis not present

## 2018-01-02 DIAGNOSIS — L905 Scar conditions and fibrosis of skin: Secondary | ICD-10-CM

## 2018-01-02 DIAGNOSIS — E1121 Type 2 diabetes mellitus with diabetic nephropathy: Secondary | ICD-10-CM

## 2018-01-02 DIAGNOSIS — L97909 Non-pressure chronic ulcer of unspecified part of unspecified lower leg with unspecified severity: Secondary | ICD-10-CM

## 2018-01-02 DIAGNOSIS — I70299 Other atherosclerosis of native arteries of extremities, unspecified extremity: Secondary | ICD-10-CM

## 2018-01-02 DIAGNOSIS — R52 Pain, unspecified: Secondary | ICD-10-CM

## 2018-01-02 NOTE — Patient Instructions (Addendum)
James Moreno , Thank you for taking time to come for your Medicare Wellness Visit. I appreciate your ongoing commitment to your health goals. Please review the following plan we discussed and let me know if I can assist you in the future.   Screening recommendations/referrals: Colonoscopy: Up to date Recommended yearly ophthalmology/optometry visit for glaucoma screening and checkup Recommended yearly dental visit for hygiene and checkup  Vaccinations: Influenza vaccine: Pt declines today.  Pneumococcal vaccine: N/A Tdap vaccine: Up to date Shingles vaccine: N/A    Advanced directives: Please bring a copy of your POA (Power of Attorney) and/or Living Will to your next appointment.   Conditions/risks identified: Recommend to continue efforts to reduce smoking habits until no longer smoking (Smoking Cessation literature attached to AVS).  Next appointment: 11:00 AM with Dr Caryn Section.   Preventive Care 40-64 Years, Male Preventive care refers to lifestyle choices and visits with your health care provider that can promote health and wellness. What does preventive care include?  A yearly physical exam. This is also called an annual well check.  Dental exams once or twice a year.  Routine eye exams. Ask your health care provider how often you should have your eyes checked.  Personal lifestyle choices, including:  Daily care of your teeth and gums.  Regular physical activity.  Eating a healthy diet.  Avoiding tobacco and drug use.  Limiting alcohol use.  Practicing safe sex.  Taking low-dose aspirin every day starting at age 45. What happens during an annual well check? The services and screenings done by your health care provider during your annual well check will depend on your age, overall health, lifestyle risk factors, and family history of disease. Counseling  Your health care provider may ask you questions about your:  Alcohol use.  Tobacco use.  Drug  use.  Emotional well-being.  Home and relationship well-being.  Sexual activity.  Eating habits.  Work and work Statistician. Screening  You may have the following tests or measurements:  Height, weight, and BMI.  Blood pressure.  Lipid and cholesterol levels. These may be checked every 5 years, or more frequently if you are over 65 years old.  Skin check.  Lung cancer screening. You may have this screening every year starting at age 42 if you have a 30-pack-year history of smoking and currently smoke or have quit within the past 15 years.  Fecal occult blood test (FOBT) of the stool. You may have this test every year starting at age 70.  Flexible sigmoidoscopy or colonoscopy. You may have a sigmoidoscopy every 5 years or a colonoscopy every 10 years starting at age 70.  Prostate cancer screening. Recommendations will vary depending on your family history and other risks.  Hepatitis C blood test.  Hepatitis B blood test.  Sexually transmitted disease (STD) testing.  Diabetes screening. This is done by checking your blood sugar (glucose) after you have not eaten for a while (fasting). You may have this done every 1-3 years. Discuss your test results, treatment options, and if necessary, the need for more tests with your health care provider. Vaccines  Your health care provider may recommend certain vaccines, such as:  Influenza vaccine. This is recommended every year.  Tetanus, diphtheria, and acellular pertussis (Tdap, Td) vaccine. You may need a Td booster every 10 years.  Zoster vaccine. You may need this after age 83.  Pneumococcal 13-valent conjugate (PCV13) vaccine. You may need this if you have certain conditions and have not been vaccinated.  Pneumococcal polysaccharide (PPSV23) vaccine. You may need one or two doses if you smoke cigarettes or if you have certain conditions. Talk to your health care provider about which screenings and vaccines you need and how  often you need them. This information is not intended to replace advice given to you by your health care provider. Make sure you discuss any questions you have with your health care provider. Document Released: 04/08/2015 Document Revised: 11/30/2015 Document Reviewed: 01/11/2015 Elsevier Interactive Patient Education  2017 [place] Prevention in the Home Falls can cause injuries. They can happen to people of all ages. There are many things you can do to make your home safe and to help prevent falls. What can I do on the outside of my home?  Regularly fix the edges of walkways and driveways and fix any cracks.  Remove anything that might make you trip as you walk through a door, such as a raised step or threshold.  Trim any bushes or trees on the path to your home.  Use bright outdoor lighting.  Clear any walking paths of anything that might make someone trip, such as rocks or tools.  Regularly check to see if handrails are loose or broken. Make sure that both sides of any steps have handrails.  Any raised decks and porches should have guardrails on the edges.  Have any leaves, snow, or ice cleared regularly.  Use sand or salt on walking paths during winter.  Clean up any spills in your garage right away. This includes oil or grease spills. What can I do in the bathroom?  Use night lights.  Install grab bars by the toilet and in the tub and shower. Do not use towel bars as grab bars.  Use non-skid mats or decals in the tub or shower.  If you need to sit down in the shower, use a plastic, non-slip stool.  Keep the floor dry. Clean up any water that spills on the floor as soon as it happens.  Remove soap buildup in the tub or shower regularly.  Attach bath mats securely with double-sided non-slip rug tape.  Do not have throw rugs and other things on the floor that can make you trip. What can I do in the bedroom?  Use night lights.  Make sure that you have a  light by your bed that is easy to reach.  Do not use any sheets or blankets that are too big for your bed. They should not hang down onto the floor.  Have a firm chair that has side arms. You can use this for support while you get dressed.  Do not have throw rugs and other things on the floor that can make you trip. What can I do in the kitchen?  Clean up any spills right away.  Avoid walking on wet floors.  Keep items that you use a lot in easy-to-reach places.  If you need to reach something above you, use a strong step stool that has a grab bar.  Keep electrical cords out of the way.  Do not use floor polish or wax that makes floors slippery. If you must use wax, use non-skid floor wax.  Do not have throw rugs and other things on the floor that can make you trip. What can I do with my stairs?  Do not leave any items on the stairs.  Make sure that there are handrails on both sides of the stairs and use them. Fix handrails that  are broken or loose. Make sure that handrails are as long as the stairways.  Check any carpeting to make sure that it is firmly attached to the stairs. Fix any carpet that is loose or worn.  Avoid having throw rugs at the top or bottom of the stairs. If you do have throw rugs, attach them to the floor with carpet tape.  Make sure that you have a light switch at the top of the stairs and the bottom of the stairs. If you do not have them, ask someone to add them for you. What else can I do to help prevent falls?  Wear shoes that:  Do not have high heels.  Have rubber bottoms.  Are comfortable and fit you well.  Are closed at the toe. Do not wear sandals.  If you use a stepladder:  Make sure that it is fully opened. Do not climb a closed stepladder.  Make sure that both sides of the stepladder are locked into place.  Ask someone to hold it for you, if possible.  Clearly mark and make sure that you can see:  Any grab bars or  handrails.  First and last steps.  Where the edge of each step is.  Use tools that help you move around (mobility aids) if they are needed. These include:  Canes.  Walkers.  Scooters.  Crutches.  Turn on the lights when you go into a dark area. Replace any light bulbs as soon as they burn out.  Set up your furniture so you have a clear path. Avoid moving your furniture around.  If any of your floors are uneven, fix them.  If there are any pets around you, be aware of where they are.  Review your medicines with your doctor. Some medicines can make you feel dizzy. This can increase your chance of falling. Ask your doctor what other things that you can do to help prevent falls. This information is not intended to replace advice given to you by your health care provider. Make sure you discuss any questions you have with your health care provider. Document Released: 01/06/2009 Document Revised: 08/18/2015 Document Reviewed: 04/16/2014 Elsevier Interactive Patient Education  2017 [employer].

## 2018-01-02 NOTE — Progress Notes (Addendum)
Subjective:   James Moreno is a 52 y.o. male who presents for an Initial Medicare Annual Wellness Visit.  Review of Systems  N/A Cardiac Risk Factors include: advanced age (>103men, >30 women);diabetes mellitus;dyslipidemia;hypertension;male gender;smoking/ tobacco exposure    Objective:    Today's Vitals   01/02/18 0912 01/02/18 0942  BP: (!) 156/78 (!) 148/74  Pulse: 95   Temp: 97.9 F (36.6 C)   TempSrc: Oral   Weight: 238 lb 9.6 oz (108.2 kg)   Height: 5\' 11"  (1.803 m)   PainSc: 0-No pain    Body mass index is 33.28 kg/m.  Advanced Directives 01/02/2018 10/21/2015 02/09/2015 11/17/2014  Does Patient Have a Medical Advance Directive? Yes Yes No Yes  Type of Paramedic of [place];Living will - - Living will;Healthcare Power of Attorney  Does patient want to make changes to medical advance directive? - - - No - Patient declined  Copy of [place] in Chart? No - copy requested - - No - copy requested    Current Medications (verified) Outpatient Encounter Medications as of 01/02/2018  Medication Sig  . acetaminophen (TYLENOL) 500 MG tablet Take 500 mg by mouth every 6 (six) hours as needed (headaches.).  Marland Kitchen alprazolam (XANAX) 2 MG tablet Take 1/2 to 1 Tablet 3 Times Daily As Needed (Patient taking differently: Take 1-2 mg by mouth 3 (three) times daily as needed (panic attacks). )  . amLODipine (NORVASC) 5 MG tablet Take 5 mg by mouth daily.  . Blood Glucose Monitoring Suppl (GLUCOCOM BLOOD GLUCOSE MONITOR) DEVI Frequency:ONCE   Dosage:0.0     Instructions:  Note:Dose: N/A  . carvedilol (COREG) 6.25 MG tablet TAKE 1 TABLET TWICE A DAY WITH MEALS  . cinacalcet (SENSIPAR) 30 MG tablet Take 30 mg by mouth daily.  . fenofibrate (TRICOR) 145 MG tablet Take 145 mg by mouth daily.  Marland Kitchen ibuprofen (ADVIL,MOTRIN) 200 MG tablet Take 200-400 mg by mouth every 8 (eight) hours as needed (for headaches.).  Marland Kitchen insulin aspart (NOVOLOG) 100 UNIT/ML  injection Inject 10-16 Units into the skin 3 (three) times daily before meals. Sliding scale  . Insulin Degludec (TRESIBA FLEXTOUCH [place]) Inject 30 Units into the skin daily with lunch.   . losartan (COZAAR) 50 MG tablet Take 50 mg by mouth at bedtime.   . lovastatin (MEVACOR) 40 MG tablet Take 40 mg by mouth at bedtime.  . mycophenolate (MYFORTIC) 360 MG TBEC EC tablet Take 360 mg by mouth 2 (two) times daily.  Marland Kitchen omeprazole (PRILOSEC) 20 MG capsule Take 20 mg by mouth daily.  Marland Kitchen oxyCODONE (OXY IR/ROXICODONE) 5 MG immediate release tablet Take 1-2 tablets (5-10 mg total) by mouth every 6 (six) hours as needed for severe pain.  Marland Kitchen tacrolimus (PROGRAF) 1 MG capsule Take 3 mg by mouth 2 (two) times daily.   Marland Kitchen VIAGRA 100 MG tablet TAKE ONE-HALF (1/2) TO ONE TABLET DAILY AS NEEDED FOR ERECTILE DYSFUNCTION  . sildenafil (VIAGRA) 100 MG tablet Take 0.5-1 tablets (50-100 mg total) daily as needed by mouth for erectile dysfunction. (Patient not taking: Reported on 01/02/2018)   No facility-administered encounter medications on file as of 01/02/2018.     Allergies (verified) Patient has no known allergies.   History: Past Medical History:  Diagnosis Date  . Acute kidney failure, unspecified ([place])   . GERD (gastroesophageal reflux disease)   . History of hepatitis B   . Hypothyroidism   . Mitral valve disorders(424.0)   . Pityriasis 12/27/2014  .  Primary pulmonary HTN ([place])   . Tricuspid valve disorders, specified as nonrheumatic    Past Surgical History:  Procedure Laterality Date  . APPENDECTOMY    . Carotid Doppler Ultrasound  06/14/2009   39% stenosis of bilateral internal carotid artery, bilateral anterograde vertebral flow  . EYE SURGERY     right  . HEMORROIDECTOMY    . KIDNEY TRANSPLANT Right 2016  . MECKEL DIVERTICULUM EXCISION     infancy  . Myocardial Perfusion scan  01/17/2009   [facility], non- ischemic. LVEF= 55%  . PARS PLANA VITRECTOMY Left 02/09/2015   Procedure: Pan  retinal photocoagulation 81448;  Surgeon: Milus Height, MD;  Location: [facility] ORS;  Service: Ophthalmology;  Laterality: Left;  . REFRACTIVE SURGERY Left   . sleep study  01/09/2011   Severe sleep apnea. AHI 72.9/hr. RDI=83.0/hr. Desaturation to 69.0% Emergency CPAP titaration to 14.0cm   Family History  Problem Relation Age of Onset  . Hypertension Mother   . Hyperlipidemia Mother   . Melanoma Father    Social History   Socioeconomic History  . Marital status: Legally Separated    Spouse name: Not on file  . Number of children: 1  . Years of education: Not on file  . Highest education level: Associate degree: occupational, Hotel manager, or vocational program  Occupational History  . Occupation: Insurance account manager    Comment: on disability  Social Needs  . Financial resource strain: Not hard at all  . Food insecurity:    Worry: Never true    Inability: Never true  . Transportation needs:    Medical: No    Non-medical: No  Tobacco Use  . Smoking status: Current Some Day Smoker    Packs/day: 0.25    Years: 31.00    Pack years: 7.75    Types: Cigarettes  . Smokeless tobacco: Never Used  . Tobacco comment: 1 pack every 3-4 days  Substance and Sexual Activity  . Alcohol use: No    Alcohol/week: 0.0 standard drinks    Comment: Excessive alcohol consumption in the past. Quit around 2016  . Drug use: No  . Sexual activity: Not on file  Lifestyle  . Physical activity:    Days per week: Not on file    Minutes per session: Not on file  . Stress: Not at all  Relationships  . Social connections:    Talks on phone: Patient refused    Gets together: Patient refused    Attends religious service: Patient refused    Active member of club or organization: Patient refused    Attends meetings of clubs or organizations: Patient refused    Relationship status: Patient refused  Other Topics Concern  . Not on file  Social History Narrative  . Not on file   Tobacco Counseling Ready to  quit: Yes Counseling given: Yes Comment: 1 pack every 3-4 days   Clinical Intake:  Pre-visit preparation completed: Yes  Pain : No/denies pain(Has chronic right heel pain. Not bothersome at the moment. ) Pain Score: 0-No pain     Nutrition Risk Assessment:  Has the patient had any N/V/D within the last 2 months?  No  Does the patient have any non-healing wounds?  No  Has the patient had any unintentional weight loss or weight gain?  No   Diabetes:  Is the patient diabetic?  Yes , type 2 If diabetic, was a CBG obtained today?  No  Did the patient bring in their glucometer from home?  No  How often do you monitor your CBG's? 3 times a day.   Financial Strains and Diabetes Management:  Are you having any financial strains with the device, your supplies or your medication? No .  Does the patient want to be seen by Chronic Care Management for management of their diabetes?  No  Would the patient like to be referred to a Nutritionist or for Diabetic Management?  No   Diabetic Exams:  Diabetic Eye Exam: Completed 12/17/17. Diabetic Foot Exam: Completed 11/14/17.   How often do you need to have someone help you when you read instructions, pamphlets, or other written materials from your doctor or pharmacy?: 1 - Never  Interpreter Needed?: No  Information entered by :: [facility], LPN  Activities of Daily Living In your present state of health, do you have any difficulty performing the following activities: 01/02/2018  Hearing? N  Vision? N  Difficulty concentrating or making decisions? N  Walking or climbing stairs? N  Dressing or bathing? N  Doing errands, shopping? N  Preparing Food and eating ? N  Using the Toilet? N  In the past six months, have you accidently leaked urine? N  Do you have problems with loss of bowel control? N  Managing your Medications? N  Managing your Finances? N  Housekeeping or managing your Housekeeping? N  Some recent data might be hidden      Immunizations and Health Maintenance Immunization History  Administered Date(s) Administered  . Influenza Split 12/05/2010  . Influenza,inj,Quad PF,6+ Mos 12/31/2016  . Pneumococcal Polysaccharide-23 10/24/2010  . Tdap 12/05/2010   Health Maintenance Due  Topic Date Due  . HIV Screening  03/10/1980  . INFLUENZA VACCINE  10/24/2017    Patient Care Team: Birdie Sons, MD as PCP - General (Family Medicine) Ronnald Collum, Lourdes Sledge, MD as Attending Physician (Endocrinology) Pa, [place]) Rockey Situ, Kathlene November, MD as Consulting Physician (Cardiology) Anthonette Legato, MD (Nephrology) Sharlotte Alamo, DPM (Podiatry) Long, Thomes Cake, MD as Referring Physician (Vascular Surgery)  Indicate any recent Medical Services you may have received from other than [facility] providers in the past year (date may be approximate).    Assessment:   This is a routine wellness examination for Jonah.  Hearing/Vision screen No exam data present  Dietary issues and exercise activities discussed: Current Exercise Habits: Structured exercise class, Type of exercise: strength training/weights;stretching;walking;treadmill, Time (Minutes): 60, Frequency (Times/Week): 3, Weekly Exercise (Minutes/Week): 180, Intensity: Mild, Exercise limited by: neurologic condition(s)  Goals    . Quit Smoking     Recommend to continue efforts to reduce smoking habits until no longer smoking (Smoking Cessation literature attached to AVS).          Depression Screen PHQ 2/9 Scores 01/02/2018 12/31/2016  PHQ - 2 Score 0 0  PHQ- 9 Score - 0    Fall Risk Fall Risk  01/02/2018  Falls in the past year? No    FALL RISK PREVENTION PERTAINING TO THE HOME:  Any stairs in or around the home WITH handrails? No  Home free of loose throw rugs in walkways, pet beds, electrical cords, etc? Yes  Adequate lighting in your home to reduce risk of falls? Yes   ASSISTIVE DEVICES UTILIZED TO PREVENT FALLS:  Life  alert? No  Use of a cane, walker or w/c? No  Grab bars in the bathroom? No  Shower chair or bench in shower? No Elevated toilet seat or a handicapped toilet? No   DME ORDERS:  DME order needed?  No   TIMED UP AND GO:  Was the test performed? No .   Cognitive Function: Pt declined screening today.         Screening Tests Health Maintenance  Topic Date Due  . HIV Screening  03/10/1980  . INFLUENZA VACCINE  10/24/2017  . HEMOGLOBIN A1C  03/18/2018  . FOOT EXAM  11/15/2018  . OPHTHALMOLOGY EXAM  12/18/2018  . Fecal DNA (Cologuard)  08/06/2020  . TETANUS/TDAP  12/04/2020  . PNEUMOCOCCAL POLYSACCHARIDE VACCINE AGE 4-64 HIGH RISK  Completed    Qualifies for Shingles Vaccine? No    Tdap: Up to date  Flu Vaccine: Due for Flu vaccine. Does the patient want to receive this vaccine today?  No . Education has been provided regarding the importance of this vaccine but still declined. Advised may receive this vaccine at local pharmacy or Health Dept. Aware to provide a copy of the vaccination record if obtained from local pharmacy or Health Dept. Verbalized acceptance and understanding.  Pneumococcal Vaccine: N/A  Cancer Screenings:  Colorectal Screening: Cologuard completed 08/06/17. Repeat every 3 years.   Lung Cancer Screening: (Low Dose CT Chest recommended if Age 70-80 years, 30 pack-year currently smoking OR have quit w/in 15years.) does not qualify.    Additional Screening:  Hepatitis C Screening: does not qualify.  Vision Screening: Recommended annual ophthalmology exams for early detection of glaucoma and other disorders of the eye.  Dental Screening: Recommended annual dental exams for proper oral hygiene  Community Resource Referral:  CRR required this visit?  No      Plan:  I have personally reviewed and addressed the Medicare Annual Wellness questionnaire and have noted the following in the patient's chart:  A. Medical and social history B. Use of alcohol,  tobacco or illicit drugs  C. Current medications and supplements D. Functional ability and status E.  Nutritional status F.  Physical activity G. Advance directives H. List of other physicians I.  Hospitalizations, surgeries, and ER visits in previous 12 months J.  [place] such as hearing and vision if needed, cognitive and depression L. Referrals and appointments - none  In addition, I have reviewed and discussed with patient certain preventive protocols, quality metrics, and best practice recommendations. A written personalized care plan for preventive services as well as general preventive health recommendations were provided to patient.  See attached scanned questionnaire for additional information.   Signed,  Fabio Neighbors, LPN Nurse Health Advisor   Nurse Recommendations: Pt states he has had the HIV lab years ago but is unable to retrieve records. Ok to add order to BW today. Pt declined the influenza vaccine at this time and would like to discuss this further with PCP at 11 AM apt.

## 2018-01-02 NOTE — Progress Notes (Signed)
Patient: James Moreno Male    DOB: 08-23-1965   52 y.o.   MRN: [id number] Visit Date: 01/02/2018  Today's Provider: Lelon Huh, MD   Chief Complaint  Patient presents with  . Anxiety    follow up  . Hypertension  . Pain  . Hyperlipidemia   Subjective:    HPI  Hypertension, follow-up:  BP Readings from Last 3 Encounters:  01/02/18 130/70  01/02/18 (!) 148/74  10/30/17 132/78    He was last seen for hypertension 6 months ago.  BP at that visit was 140/70. Management since that visit includes no changes. He reports good compliance with treatment. He is not having side effects.  He is exercising. He is adherent to low salt diet.   Outside blood pressures are 130/78. He is experiencing none.  Patient denies chest pain, chest pressure/discomfort, claudication, dyspnea, exertional chest pressure/discomfort, fatigue, irregular heart beat, lower extremity edema, near-syncope, orthopnea, palpitations, paroxysmal nocturnal dyspnea, syncope and tachypnea.   Cardiovascular risk factors include dyslipidemia, hypertension and male gender.  Use of agents associated with hypertension: none.     Weight trend: stable Wt Readings from Last 3 Encounters:  01/02/18 238 lb 9.6 oz (108.2 kg)  10/30/17 237 lb 12.8 oz (107.9 kg)  10/16/17 236 lb (107 kg)    Current diet: well balanced  ------------------------------------------------------------------------  Lipid/Cholesterol, Follow-up:   Last seen for this 6 months ago.  Management changes since that visit include none (managed by Dr. Carmela Rima). . Last Lipid Panel:    Component Value Date/Time   CHOL 172 03/28/2016 1048   TRIG 357 (H) 03/28/2016 1048   HDL 37 (L) 03/28/2016 1048   CHOLHDL 4.6 03/28/2016 1048   [place] 64 03/28/2016 1048    Risk factors for vascular disease include hypercholesterolemia and hypertension  He reports good compliance with treatment. He is not having side effects.  Current  symptoms include none and have been stable. Weight trend: stable Prior visit with dietician: no Current diet: well balanced Current exercise: cardiovascular workout on exercise equipment  Wt Readings from Last 3 Encounters:  01/02/18 238 lb 9.6 oz (108.2 kg)  10/30/17 237 lb 12.8 oz (107.9 kg)  10/16/17 236 lb (107 kg)    ------------------------------------------------------------------- Follow up of Anxiety: Patient was last seen for this problem 6 months ago and no changes were made. Patient reports this problem is stable.    Follow up of Chronic pain:  Patient was last seen for this problem 6 months ago and no changes were made. Patient reports this problem is stable with current medication. He states he takes oxycodone/apap consistently   He continues to follow up with Dr. Carmela Rima for diabetes and reports that last a1c a few months ago was 7.1%.   He states he is working on quitting smoking and has seen a counselor who has given him some strategies to quit.     No Known Allergies   Current Outpatient Medications:  .  acetaminophen (TYLENOL) 500 MG tablet, Take 500 mg by mouth every 6 (six) hours as needed (headaches.)., Disp: , Rfl:  .  alprazolam (XANAX) 2 MG tablet, Take 1/2 to 1 Tablet 3 Times Daily As Needed (Patient taking differently: Take 1-2 mg by mouth 3 (three) times daily as needed (panic attacks). ), Disp: 90 tablet, Rfl: 3 .  amLODipine (NORVASC) 5 MG tablet, Take 5 mg by mouth daily., Disp: , Rfl:  .  Blood Glucose Monitoring Suppl (GLUCOCOM BLOOD GLUCOSE MONITOR)  DEVI, Frequency:ONCE   Dosage:0.0     Instructions:  Note:Dose: N/A, Disp: , Rfl:  .  carvedilol (COREG) 6.25 MG tablet, TAKE 1 TABLET TWICE A DAY WITH MEALS, Disp: 180 tablet, Rfl: 0 .  cinacalcet (SENSIPAR) 30 MG tablet, Take 30 mg by mouth daily., Disp: , Rfl:  .  fenofibrate (TRICOR) 145 MG tablet, Take 145 mg by mouth daily., Disp: , Rfl:  .  ibuprofen (ADVIL,MOTRIN) 200 MG tablet, Take 200-400 mg  by mouth every 8 (eight) hours as needed (for headaches.)., Disp: , Rfl:  .  insulin aspart (NOVOLOG) 100 UNIT/ML injection, Inject 10-16 Units into the skin 3 (three) times daily before meals. Sliding scale, Disp: , Rfl:  .  Insulin Degludec (TRESIBA FLEXTOUCH [place]), Inject 30 Units into the skin daily with lunch. , Disp: , Rfl:  .  losartan (COZAAR) 50 MG tablet, Take 50 mg by mouth at bedtime. , Disp: , Rfl: 11 .  lovastatin (MEVACOR) 40 MG tablet, Take 40 mg by mouth at bedtime., Disp: , Rfl:  .  mycophenolate (MYFORTIC) 360 MG TBEC EC tablet, Take 360 mg by mouth 2 (two) times daily., Disp: , Rfl:  .  omeprazole (PRILOSEC) 20 MG capsule, Take 20 mg by mouth daily., Disp: , Rfl:  .  oxyCODONE (OXY IR/ROXICODONE) 5 MG immediate release tablet, Take 1-2 tablets (5-10 mg total) by mouth every 6 (six) hours as needed for severe pain., Disp: 240 tablet, Rfl: 0 .  sildenafil (VIAGRA) 100 MG tablet, Take 0.5-1 tablets (50-100 mg total) daily as needed by mouth for erectile dysfunction. (Patient not taking: Reported on 01/02/2018), Disp: 30 tablet, Rfl: 5 .  tacrolimus (PROGRAF) 1 MG capsule, Take 3 mg by mouth 2 (two) times daily. , Disp: , Rfl: 11 .  VIAGRA 100 MG tablet, TAKE ONE-HALF (1/2) TO ONE TABLET DAILY AS NEEDED FOR ERECTILE DYSFUNCTION, Disp: 30 tablet, Rfl: 5  Review of Systems  Constitutional: Negative for appetite change, chills and fever.  Respiratory: Negative for chest tightness, shortness of breath and wheezing.   Cardiovascular: Negative for chest pain and palpitations.  Gastrointestinal: Negative for abdominal pain, nausea and vomiting.    Social History   Tobacco Use  . Smoking status: Current Some Day Smoker    Packs/day: 0.25    Years: 31.00    Pack years: 7.75    Types: Cigarettes  . Smokeless tobacco: Never Used  . Tobacco comment: 1 pack every 3-4 days  Substance Use Topics  . Alcohol use: No    Alcohol/week: 0.0 standard drinks    Comment: Excessive alcohol  consumption in the past. Quit around 2016   Objective:   BP 130/70 (BP Location: Left Arm, Patient Position: Sitting, Cuff Size: Large)  Vitals:   01/02/18 1117  BP: 130/70     BP 148/74 (BP Location: Right Arm)   Pulse 95   Temp 97.9 F (36.6 C) (Oral)   Ht 5\' 11"  (1.803 m)   Wt 238 lb 9.6 oz (108.2 kg)   BMI 33.28 kg/m   BSA 2.33 m   Pain [place] 0-No pain     Physical Exam   General Appearance:    Alert, cooperative, no distress, obese  Eyes:    PERRL, conjunctiva/corneas clear, EOM's intact       Lungs:     Clear to auscultation bilaterally, respirations unlabored  Heart:    Regular rate and rhythm  Neurologic:   Awake, alert, oriented x 3. No apparent focal neurological  defect.           Assessment & Plan:     1. Hypertension secondary to other renal disorders Fairly stable. Continue current medications.  Continue follow up nephrology.   2. Hypertriglyceridemia Doing well on lovastatin and fenofibrate.  - Lipid panel  3. Prostate cancer screening  - PSA  4. Bilateral carotid artery disease, unspecified type ([place]) Asymptomatic. Compliant with medication.  Continue aggressive risk factor modification.    5. Atherosclerosis of native artery of right lower extremity with ulceration of heel (HCC) Asymptomatic. Compliant with medication.  Continue aggressive risk factor modification.    6. Type 2 diabetes mellitus with diabetic nephropathy, without long-term current use of insulin (HCC) Well controlled per patient report, managed by Dr. Carmela Rima  7. Renal transplant, status post Doing well post transplant. Continue regular nephrology follow up.   8. Chronic post-surgical pain Well controlled on current regiment. He states he has narcan at home supplied by his pharmacist.       Lelon Huh, MD  [place]

## 2018-01-03 ENCOUNTER — Telehealth: Payer: Self-pay | Admitting: Family Medicine

## 2018-01-03 NOTE — Telephone Encounter (Signed)
Pt needing to speak with [place].  Please call pt back asap.  Thanks, [employer]

## 2018-01-03 NOTE — Telephone Encounter (Signed)
Pt wanted to verify that he is NOT legally separated and that he is still considered married. At [facility], pt said he was seperated. Updated info in chart.  -MM

## 2018-01-07 ENCOUNTER — Other Ambulatory Visit: Payer: Self-pay | Admitting: Family Medicine

## 2018-01-07 DIAGNOSIS — R52 Pain, unspecified: Secondary | ICD-10-CM

## 2018-01-07 DIAGNOSIS — L905 Scar conditions and fibrosis of skin: Secondary | ICD-10-CM

## 2018-01-07 DIAGNOSIS — M79604 Pain in right leg: Secondary | ICD-10-CM

## 2018-01-07 NOTE — Telephone Encounter (Signed)
Needs refill on   Oxycodone 5mg   [place]  [facility]

## 2018-01-08 MED ORDER — OXYCODONE HCL 5 MG PO TABS: 5.0000 mg | ORAL_TABLET | Freq: Four times a day (QID) | ORAL | 0 refills | Status: DC | PRN

## 2018-01-10 DIAGNOSIS — Z125 Encounter for screening for malignant neoplasm of prostate: Secondary | ICD-10-CM | POA: Diagnosis not present

## 2018-01-10 DIAGNOSIS — E781 Pure hyperglyceridemia: Secondary | ICD-10-CM | POA: Diagnosis not present

## 2018-01-11 LAB — LIPID PANEL
Chol/HDL Ratio: 5.5 ratio — ABNORMAL HIGH (ref 0.0–5.0)
Cholesterol, Total: 166 mg/dL (ref 100–199)
HDL: 30 mg/dL — ABNORMAL LOW (ref >39)
LDL Calculated: 77 mg/dL (ref 0–99)
Triglycerides: 296 mg/dL — ABNORMAL HIGH (ref 0–149)
VLDL Cholesterol Cal: 59 mg/dL — ABNORMAL HIGH (ref 5–40)

## 2018-01-11 LAB — PSA: Prostate Specific Ag, Serum: 0.1 ng/mL (ref 0.0–4.0)

## 2018-01-16 DIAGNOSIS — E1165 Type 2 diabetes mellitus with hyperglycemia: Secondary | ICD-10-CM | POA: Diagnosis not present

## 2018-01-20 DIAGNOSIS — I10 Essential (primary) hypertension: Secondary | ICD-10-CM | POA: Diagnosis not present

## 2018-01-20 DIAGNOSIS — N2581 Secondary hyperparathyroidism of renal origin: Secondary | ICD-10-CM | POA: Diagnosis not present

## 2018-01-20 DIAGNOSIS — N186 End stage renal disease: Secondary | ICD-10-CM | POA: Diagnosis not present

## 2018-01-20 DIAGNOSIS — Z940 Kidney transplant status: Secondary | ICD-10-CM | POA: Diagnosis not present

## 2018-01-28 DIAGNOSIS — E6609 Other obesity due to excess calories: Secondary | ICD-10-CM | POA: Diagnosis not present

## 2018-01-28 DIAGNOSIS — N189 Chronic kidney disease, unspecified: Secondary | ICD-10-CM | POA: Diagnosis not present

## 2018-01-28 DIAGNOSIS — E118 Type 2 diabetes mellitus with unspecified complications: Secondary | ICD-10-CM | POA: Diagnosis not present

## 2018-01-28 DIAGNOSIS — E08311 Diabetes mellitus due to underlying condition with unspecified diabetic retinopathy with macular edema: Secondary | ICD-10-CM | POA: Diagnosis not present

## 2018-01-28 DIAGNOSIS — E1165 Type 2 diabetes mellitus with hyperglycemia: Secondary | ICD-10-CM | POA: Diagnosis not present

## 2018-01-28 DIAGNOSIS — E0859 Diabetes mellitus due to underlying condition with other circulatory complications: Secondary | ICD-10-CM | POA: Diagnosis not present

## 2018-01-28 DIAGNOSIS — I10 Essential (primary) hypertension: Secondary | ICD-10-CM | POA: Diagnosis not present

## 2018-01-28 DIAGNOSIS — Z6832 Body mass index (BMI) 32.0-32.9, adult: Secondary | ICD-10-CM | POA: Diagnosis not present

## 2018-01-28 DIAGNOSIS — E1121 Type 2 diabetes mellitus with diabetic nephropathy: Secondary | ICD-10-CM | POA: Diagnosis not present

## 2018-01-28 DIAGNOSIS — N2581 Secondary hyperparathyroidism of renal origin: Secondary | ICD-10-CM | POA: Diagnosis not present

## 2018-01-28 DIAGNOSIS — E785 Hyperlipidemia, unspecified: Secondary | ICD-10-CM | POA: Diagnosis not present

## 2018-01-28 DIAGNOSIS — I7389 Other specified peripheral vascular diseases: Secondary | ICD-10-CM | POA: Diagnosis not present

## 2018-02-04 ENCOUNTER — Other Ambulatory Visit: Payer: Self-pay | Admitting: Family Medicine

## 2018-02-04 NOTE — Telephone Encounter (Signed)
Pt contacted office for refill request on the following medications:  alprazolam (XANAX) 2 MG tablet  [facility] [address]  Please advise. Thanks TNP

## 2018-02-05 ENCOUNTER — Other Ambulatory Visit: Payer: Self-pay | Admitting: Family Medicine

## 2018-02-05 DIAGNOSIS — M79604 Pain in right leg: Secondary | ICD-10-CM

## 2018-02-05 DIAGNOSIS — L905 Scar conditions and fibrosis of skin: Secondary | ICD-10-CM

## 2018-02-05 DIAGNOSIS — R52 Pain, unspecified: Secondary | ICD-10-CM

## 2018-02-05 MED ORDER — OXYCODONE HCL 5 MG PO TABS: 5.0000 mg | ORAL_TABLET | Freq: Four times a day (QID) | ORAL | 0 refills | Status: DC | PRN

## 2018-02-05 NOTE — Telephone Encounter (Signed)
Pt needing refill on: oxyCODONE (OXY IR/ROXICODONE) 5 MG immediate release tablet  Please fill at:  [facility] [address], [place] [phone] (Phone) [phone] (Fax)    Thanks, [facility]

## 2018-02-12 ENCOUNTER — Encounter: Payer: Medicare Other | Attending: Internal Medicine | Admitting: Internal Medicine

## 2018-02-12 ENCOUNTER — Other Ambulatory Visit
Admission: RE | Admit: 2018-02-12 | Discharge: 2018-02-12 | Disposition: A | Discharge status: Home / Self Care | Payer: Medicare Other | Source: Ambulatory Visit | Attending: Internal Medicine | Admitting: Internal Medicine

## 2018-02-12 DIAGNOSIS — N183 Chronic kidney disease, stage 3 (moderate): Secondary | ICD-10-CM | POA: Insufficient documentation

## 2018-02-12 DIAGNOSIS — E1122 Type 2 diabetes mellitus with diabetic chronic kidney disease: Secondary | ICD-10-CM | POA: Diagnosis not present

## 2018-02-12 DIAGNOSIS — B999 Unspecified infectious disease: Secondary | ICD-10-CM | POA: Diagnosis not present

## 2018-02-12 DIAGNOSIS — E11621 Type 2 diabetes mellitus with foot ulcer: Secondary | ICD-10-CM | POA: Diagnosis not present

## 2018-02-12 DIAGNOSIS — I130 Hypertensive heart and chronic kidney disease with heart failure and stage 1 through stage 4 chronic kidney disease, or unspecified chronic kidney disease: Secondary | ICD-10-CM | POA: Diagnosis not present

## 2018-02-12 DIAGNOSIS — L97411 Non-pressure chronic ulcer of right heel and midfoot limited to breakdown of skin: Secondary | ICD-10-CM | POA: Insufficient documentation

## 2018-02-12 DIAGNOSIS — F17210 Nicotine dependence, cigarettes, uncomplicated: Secondary | ICD-10-CM | POA: Diagnosis not present

## 2018-02-12 DIAGNOSIS — Z940 Kidney transplant status: Secondary | ICD-10-CM | POA: Diagnosis not present

## 2018-02-12 DIAGNOSIS — L97412 Non-pressure chronic ulcer of right heel and midfoot with fat layer exposed: Secondary | ICD-10-CM | POA: Diagnosis not present

## 2018-02-13 ENCOUNTER — Other Ambulatory Visit (HOSPITAL_BASED_OUTPATIENT_CLINIC_OR_DEPARTMENT_OTHER): Payer: Self-pay | Admitting: Internal Medicine

## 2018-02-13 ENCOUNTER — Ambulatory Visit
Admission: RE | Admit: 2018-02-13 | Discharge: 2018-02-13 | Disposition: A | Discharge status: Home / Self Care | Payer: Medicare Other | Source: Ambulatory Visit | Attending: Internal Medicine | Admitting: Internal Medicine

## 2018-02-13 DIAGNOSIS — S81801A Unspecified open wound, right lower leg, initial encounter: Secondary | ICD-10-CM | POA: Insufficient documentation

## 2018-02-13 DIAGNOSIS — B999 Unspecified infectious disease: Secondary | ICD-10-CM

## 2018-02-13 DIAGNOSIS — L97519 Non-pressure chronic ulcer of other part of right foot with unspecified severity: Secondary | ICD-10-CM | POA: Diagnosis not present

## 2018-02-14 NOTE — Progress Notes (Addendum)
James Moreno, James Moreno ([id number]) Visit Report for 02/12/2018 Chief Complaint Document Details Patient Name: James Moreno, James Moreno. Date of Service: 02/12/2018 1:00 PM Medical Record Number: [id number] Patient Account Number: [id number] Date of Birth/Sex: 09/18/1965 (52 y.o. M) Treating RN: Cornell Barman Primary Care Provider: Lelon Huh Other Clinician: Referring Provider: Lelon Huh Treating Provider/Extender: Tito Dine in Treatment: 0 Information Obtained from: Patient Chief Complaint right heel wound 02/12/18; patient returns with a new wound over the right lateral heel Electronic Signature(s) Signed: 02/12/2018 5:31:58 PM By: Linton Ham MD Entered By: Linton Ham on 02/12/2018 14:13:50 James Moreno ([id number]) -------------------------------------------------------------------------------- Debridement Details Patient Name: James Moreno. Date of Service: 02/12/2018 1:00 PM Medical Record Number: [id number] Patient Account Number: [id number] Date of Birth/Sex: [birth year] (52 y.o. M) Treating RN: Cornell Barman Primary Care Provider: Lelon Huh Other Clinician: Referring Provider: Lelon Huh Treating Provider/Extender: Tito Dine in Treatment: 0 Debridement Performed for Wound #2 Right Calcaneus Assessment: Performed By: Physician Ricard Dillon, MD Debridement Type: Debridement Severity of Tissue Pre Fat layer exposed Debridement: Level of Consciousness (Pre- Awake and Alert procedure): Pre-procedure Verification/Time Yes - 13:39 Out Taken: Start Time: 13:39 Pain Control: Lidocaine 4% Topical Solution Total Area Debrided (L x W): 0.5 (cm) x 0.7 (cm) = 0.35 (cm) Tissue and other material Subcutaneous debrided: Level: Skin/Subcutaneous Tissue Debridement Description: Excisional Instrument: Curette Specimen: Swab, Number of Specimens Taken: 1 Bleeding: None End Time: 13:42 Procedural Pain: 5 Post Procedural  Pain: 1 Response to Treatment: Procedure was tolerated well Level of Consciousness Awake and Alert (Post-procedure): Post Debridement Measurements of Total Wound Length: (cm) 0.5 Width: (cm) 0.7 Depth: (cm) 0.2 Volume: (cm) 0.055 Character of Wound/Ulcer Post Debridement: Improved Severity of Tissue Post Debridement: Fat layer exposed Post Procedure Diagnosis Same as Pre-procedure Electronic Signature(s) Signed: 02/12/2018 5:21:06 PM By: Gretta Cool, BSN, RN, CWS, Kim RN, BSN Signed: 02/12/2018 5:31:58 PM By: Linton Ham MD Entered By: Linton Ham on 02/12/2018 14:05:20 James Moreno ([id number]) -------------------------------------------------------------------------------- HPI Details Patient Name: James Moreno, James Moreno. Date of Service: 02/12/2018 1:00 PM Medical Record Number: [id number] Patient Account Number: [id number] Date of Birth/Sex: 15-Apr-1965 (52 y.o. M) Treating RN: Cornell Barman Primary Care Provider: Lelon Huh Other Clinician: Referring Provider: Lelon Huh Treating Provider/Extender: Tito Dine in Treatment: 0 History of Present Illness HPI Description: 10/24/17-He is seen in initial evaluation for a right posterior heel wound. He states approximately 2 months ago he sustained an injury while using a pumice/callus grater on his heels. In the time since that injury he has seen [facility], [facility], Dr. Cleda Mccreedy at [place] [facility], [facility] or so, infectious disease, and urgent care. He is currently on no antibiotc therapy but has a history of being on keflex and bactrim (at time of injury) which was discontinued at presentation to [facility] ER (within a week after injury) and initiated on cipro and clindamycin. He did see infectious disease in [place] who recommended a CAT scan, he has not heard back about the CAT scan. He saw Dr. Cleda Mccreedy last week who referred him to [facility] for evaluation. he admits to not  offloading, was never instructed to. He is on chronic immunosuppression secondary to a renal transplant 2016. His diabetes is controlled with an A1c of 7.4 last week. He does admit to pain, relieved without need for medication. He is smoking, with a plan to quit smoking cigarettes by next Monday with individual goal to decrease the amount of nicotine and his  vape to 0%. He has been encouraged to expedite the smoking cessation. 10/31/17-He is seen in follow-up evaluation for right posterior heel wound. There is no deterioration, he remains pinpoint with scant amount of serous drainage. He was evaluated by [facility] yesterday (RIGHT: ABI 1.37, TBI 0.48; LEFT: ABI 1.06, TBI 0.3) with plans for angioplasty next week. We will continue with same treatment plan and he will follow up in two weeks. He was strongly encouraged to have complete smoking cessation prior to or immediately after angioplasty to increase success. READMISSION 02/12/18 This is a 52 year old man who is a type II diabetic. He was here for 2 visits in the summer at which time he had an area on the right posterior tip of his heel. He states he was removing callus with some form of instrument caused this wound. He was also followed by Dr. Sharlotte Alamo podiatry at [facility]. He states this eventually healed over with Hydrofera Blue. The patient saw vascular surgery at [facility] on 9/16 at which time the wound was healed. He states 2 or 3 weeks ago he was removing callus from the side of his heel using topical antibiotics and he opened a new wound in this area. He states that this is very painful in fact he states the other heel wound was also painful and the pain was not completely relieved when this closed over. He has known PAD apparently saw [facility] in August and they were planning for an angioplasty although the patient went for a second opinion at [facility] by Dr. Tamera Punt long who did not feel he needed  an intervention at that time. Plans were being made to follow this with follow-up arterial studies. He has not had any more recent arterial studies then were quoted in his note of 10/31/17. At that point he had noncompressible vessels on the right with an ABI of 1.37 and a TBI of 0.48. He has been washing this wound with soap and water and covering with a Band-Aid Past medical history includes hypertension, type 2 diabetes with PAD, stage III chronic renal failure status post kidney transplant. Electronic Signature(s) Signed: 02/12/2018 5:31:58 PM By: Linton Ham MD Entered By: Linton Ham on 02/12/2018 14:21:54 James Moreno ([id number]) -------------------------------------------------------------------------------- Physical Exam Details Patient Name: James Moreno, James Moreno. Date of Service: 02/12/2018 1:00 PM Medical Record Number: [id number] Patient Account Number: [id number] Date of Birth/Sex: [birth year] (52 y.o. M) Treating RN: Cornell Barman Primary Care Provider: Lelon Huh Other Clinician: Referring Provider: Lelon Huh Treating Provider/Extender: Tito Dine in Treatment: 0 Constitutional Patient is hypertensive.. Pulse regular and within target range for patient.Marland Kitchen Respirations regular, non-labored and within target range.. Temperature is normal and within the target range for the patient.Marland Kitchen appears in no distress. Eyes Conjunctivae clear. No discharge. Respiratory Respiratory effort is easy and symmetric bilaterally. Rate is normal at rest and on room air.. Bilateral breath sounds are clear and equal in all lobes with no wheezes, rales or rhonchi.. Cardiovascular Heart rhythm and rate regular, without murmur or gallop.. Pedal pulses are palpable. Lymphatic None palpable in the popliteal area. Integumentary (Hair, Skin) No primary skin issue is seen. Psychiatric No evidence of depression, anxiety, or agitation. Calm, cooperative, and communicative.  Appropriate interactions and affect.. Notes Wound exam; the patient has a small punched out area on the right lateral heel not a weightbearing surface. This was covered in adherent eschar and surrounding this there is erythema with very significant tenderness. I am not  sure this represents cellulitis but it was enough to get me to culture the wound bed post debridement. Using a #3 curet I remove the eschar and some nonviable subcutaneous tissue and the wound bed looks fairly healthy. Post debridement a culture was done Electronic Signature(s) Signed: 02/12/2018 5:31:58 PM By: Linton Ham MD Entered By: Linton Ham on 02/12/2018 14:34:20 James Moreno ([id number]) -------------------------------------------------------------------------------- Physician Orders Details Patient Name: James Moreno, James Moreno. Date of Service: 02/12/2018 1:00 PM Medical Record Number: [id number] Patient Account Number: [id number] Date of Birth/Sex: [birth year] (52 y.o. M) Treating RN: Cornell Barman Primary Care Provider: Lelon Huh Other Clinician: Referring Provider: Lelon Huh Treating Provider/Extender: Tito Dine in Treatment: 0 Verbal / Phone Orders: No Diagnosis Coding Wound Cleansing Wound #2 Right Calcaneus o Clean wound with Normal Saline. Anesthetic (add to Medication List) Wound #2 Right Calcaneus o Topical Lidocaine 4% cream applied to wound bed prior to debridement (In Clinic Only). o Benzocaine Topical Anesthetic Spray applied to wound bed prior to debridement (In Clinic Only). Primary Wound Dressing Wound #2 Right Calcaneus o Santyl Ointment Secondary Dressing Wound #2 Right Calcaneus o Dry Gauze o Boardered Foam Dressing o Other - Heel cup Dressing Change Frequency Wound #2 Right Calcaneus o Change dressing every day. Follow-up Appointments Wound #2 Right Calcaneus o Return Appointment in 1 week. Off-Loading Wound #2 Right Calcaneus o  Open toe surgical shoe to: Laboratory o Bacteria identified in Wound by Culture (MICRO) - Right lateral foot oooo LOINC Code: 2876-8 oooo Convenience Name: Wound culture routine Radiology o X-ray, foot - Right lateral foot wound- foot 3 view James Moreno, James Moreno ([id number]) Patient Medications Allergies: No Known Allergies Notifications Medication Indication Start End doxycycline hyclate 02/18/2018 DOSE 1 - oral 100 mg capsule - 1 capsule oral taken 2 times a day for 10 days Electronic Signature(s) Signed: 02/18/2018 10:54:39 AM By: Worthy Keeler PA-C Signed: 02/26/2018 4:50:38 PM By: Linton Ham MD Previous Signature: 02/12/2018 5:21:06 PM Version By: Gretta Cool BSN, RN, CWS, Kim RN, BSN Previous Signature: 02/12/2018 5:31:58 PM Version By: Linton Ham MD Entered By: Worthy Keeler on 02/18/2018 10:54:38 James Moreno, James Moreno ([id number]) -------------------------------------------------------------------------------- Problem List Details Patient Name: James Moreno, James Moreno. Date of Service: 02/12/2018 1:00 PM Medical Record Number: [id number] Patient Account Number: [id number] Date of Birth/Sex: 06/07/1965 (52 y.o. M) Treating RN: Cornell Barman Primary Care Provider: Lelon Huh Other Clinician: Referring Provider: Lelon Huh Treating Provider/Extender: Ricard Dillon Weeks in Treatment: 0 Active Problems ICD-10 Evaluated Encounter Code Description Active Date Today Diagnosis E11.621 Type 2 diabetes mellitus with foot ulcer 02/12/2018 No Yes L97.411 Non-pressure chronic ulcer of right heel and midfoot limited 02/12/2018 No Yes to breakdown of skin Inactive Problems Resolved Problems Electronic Signature(s) Signed: 02/12/2018 5:31:58 PM By: Linton Ham MD Entered By: Linton Ham on 02/12/2018 14:03:41 James Moreno ([id number]) -------------------------------------------------------------------------------- Progress Note Details Patient Name: James Moreno. Date of Service: 02/12/2018 1:00 PM Medical Record Number: [id number] Patient Account Number: [id number] Date of Birth/Sex: January 11, 1966 (52 y.o. M) Treating RN: Cornell Barman Primary Care Provider: Lelon Huh Other Clinician: Referring Provider: Lelon Huh Treating Provider/Extender: Tito Dine in Treatment: 0 Subjective Chief Complaint Information obtained from Patient right heel wound 02/12/18; patient returns with a new wound over the right lateral heel History of Present Illness (HPI) 10/24/17-He is seen in initial evaluation for a right posterior heel wound. He states approximately 2 months ago he sustained an injury while using a pumice/callus grater on his heels. In the  time since that injury he has seen [facility], [facility], Dr. Cleda Mccreedy at [facility], [facility], infectious disease, and urgent care. He is currently on no antibiotc therapy but has a history of being on keflex and bactrim (at time of injury) which was discontinued at presentation to [facility] ER (within a week after injury) and initiated on cipro and clindamycin. He did see infectious disease in [place] who recommended a CAT scan, he has not heard back about the CAT scan. He saw Dr. Cleda Mccreedy last week who referred him to [facility] for evaluation. he admits to not offloading, was never instructed to. He is on chronic immunosuppression secondary to a renal transplant 2016. His diabetes is controlled with an A1c of 7.4 last week. He does admit to pain, relieved without need for medication. He is smoking, with a plan to quit smoking cigarettes by next Monday with individual goal to decrease the amount of nicotine and his vape to 0%. He has been encouraged to expedite the smoking cessation. 10/31/17-He is seen in follow-up evaluation for right posterior heel wound. There is no deterioration, he remains pinpoint with scant amount of serous drainage. He was evaluated by  [facility] yesterday (RIGHT: ABI 1.37, TBI 0.48; LEFT: ABI 1.06, TBI 0.3) with plans for angioplasty next week. We will continue with same treatment plan and he will follow up in two weeks. He was strongly encouraged to have complete smoking cessation prior to or immediately after angioplasty to increase success. READMISSION 02/12/18 This is a 52 year old man who is a type II diabetic. He was here for 2 visits in the summer at which time he had an area on the right posterior tip of his heel. He states he was removing callus with some form of instrument caused this wound. He was also followed by Dr. Sharlotte Alamo podiatry at [facility]. He states this eventually healed over with Hydrofera Blue. The patient saw vascular surgery at [facility] on 9/16 at which time the wound was healed. He states 2 or 3 weeks ago he was removing callus from the side of his heel using topical antibiotics and he opened a new wound in this area. He states that this is very painful in fact he states the other heel wound was also painful and the pain was not completely relieved when this closed over. He has known PAD apparently saw [facility] in August and they were planning for an angioplasty although the patient went for a second opinion at [facility] by Dr. Tamera Punt long who did not feel he needed an intervention at that time. Plans were being made to follow this with follow-up arterial studies. He has not had any more recent arterial studies then were quoted in his note of 10/31/17. At that point he had noncompressible vessels on the right with an ABI of 1.37 and a TBI of 0.48. He has been washing this wound with soap and water and covering with a Band-Aid Past medical history includes hypertension, type 2 diabetes with PAD, stage III chronic renal failure status post kidney transplant. Wound History Patient reportedly has not tested positive for osteomyelitis. Patient reportedly has not had  testing performed to evaluate circulation in the legs. James Moreno, James Moreno ([id number]) Patient History Information obtained from Patient. Allergies No Known Allergies Family History Cancer - Father, Diabetes - Father,Paternal Grandparents, Kidney Disease, Lung Disease, No family history of Heart Disease, Hereditary Spherocytosis, Hypertension, Seizures, Stroke, Thyroid Problems, Tuberculosis. Social  History Current every day smoker - 1/2 pack daily, Marital Status - Married, Alcohol Use - Rarely, Drug Use - No History, Caffeine Use - Daily. Medical History Hospitalization/Surgery History - 06/24/2016, [place], Kidney transplant. Medical And Surgical History Notes Genitourinary kidney transplant 07/24/2014 Review of Systems (ROS) Eyes Denies complaints or symptoms of Dry Eyes, Vision Changes, Glasses / Contacts. Ear/Nose/Mouth/Throat Denies complaints or symptoms of Difficult clearing ears, Sinusitis. Hematologic/Lymphatic Denies complaints or symptoms of Bleeding / Clotting Disorders, Human Immunodeficiency Virus. Respiratory Denies complaints or symptoms of Chronic or frequent coughs, Shortness of Breath. Cardiovascular Denies complaints or symptoms of Chest pain, LE edema. Gastrointestinal Denies complaints or symptoms of Frequent diarrhea, Nausea, Vomiting. Endocrine Complains or has symptoms of Hepatitis - 30 years ago hep B. Denies complaints or symptoms of Thyroid disease, Polydypsia (Excessive Thirst). Immunological Denies complaints or symptoms of Hives, Itching. Integumentary (Skin) Denies complaints or symptoms of Wounds, Bleeding or bruising tendency, Breakdown, Swelling. Musculoskeletal Denies complaints or symptoms of Muscle Pain, Muscle Weakness. Neurologic Denies complaints or symptoms of Numbness/parasthesias, Focal/Weakness. Objective BARY, LIMBACH ([id number]) Constitutional Patient is hypertensive.. Pulse regular and within target range for  patient.Marland Kitchen Respirations regular, non-labored and within target range.. Temperature is normal and within the target range for the patient.Marland Kitchen appears in no distress. Vitals Time Taken: 1:17 PM, Height: 71 in, Weight: 238 lbs, BMI: 33.2, Temperature: 98.2 F, Pulse: 88 bpm, Respiratory Rate: 16 breaths/min, Blood Pressure: 160/87 mmHg. Eyes Conjunctivae clear. No discharge. Respiratory Respiratory effort is easy and symmetric bilaterally. Rate is normal at rest and on room air.. Bilateral breath sounds are clear and equal in all lobes with no wheezes, rales or rhonchi.. Cardiovascular Heart rhythm and rate regular, without murmur or gallop.. Pedal pulses are palpable. Lymphatic None palpable in the popliteal area. Psychiatric No evidence of depression, anxiety, or agitation. Calm, cooperative, and communicative. Appropriate interactions and affect.. General Notes: Wound exam; the patient has a small punched out area on the right lateral heel not a weightbearing surface. This was covered in adherent eschar and surrounding this there is erythema with very significant tenderness. I am not sure this represents cellulitis but it was enough to get me to culture the wound bed post debridement. Using a #3 curet I remove the eschar and some nonviable subcutaneous tissue and the wound bed looks fairly healthy. Post debridement a culture was done Integumentary (Hair, Skin) No primary skin issue is seen. Wound #2 status is Open. Original cause of wound was Gradually Appeared. The wound is located on the Right Calcaneus. The wound measures 0.5cm length x 0.7cm width x 0.1cm depth; 0.275cm^2 area and 0.027cm^3 volume. There is Fat Layer (Subcutaneous Tissue) Exposed exposed. There is no tunneling or undermining noted. There is a medium amount of serosanguineous drainage noted. The wound margin is flat and intact. There is no granulation within the wound bed. There is a large (67-100%) amount of necrotic  tissue within the wound bed including Adherent Slough. The periwound skin appearance exhibited: Scarring, Erythema. The periwound skin appearance did not exhibit: Callus, Crepitus, Excoriation, Induration, Rash, Dry/Scaly, Maceration, Atrophie Blanche, Cyanosis, Ecchymosis, Hemosiderin Staining, Mottled, Pallor, Rubor. The surrounding wound skin color is noted with erythema which is circumferential. Periwound temperature was noted as No Abnormality. The periwound has tenderness on palpation. 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 ([id number]) Procedures Wound #2 Pre-procedure diagnosis of Wound #2 is  a Diabetic Wound/Ulcer of the Lower Extremity located on the Right Calcaneus .Severity of Tissue Pre Debridement is: Fat layer exposed. There was a Excisional Skin/Subcutaneous Tissue Debridement with a total area of 0.35 sq cm performed by Ricard Dillon, MD. With the following instrument(s): Curette Material removed includes Subcutaneous Tissue after achieving pain control using Lidocaine 4% Topical Solution. 1 specimen was taken by a Swab and sent to the lab per facility protocol. A time out was conducted at 13:39, prior to the start of the procedure. There was no bleeding. The procedure was tolerated well with a pain level of 5 throughout and a pain level of 1 following the procedure. Post Debridement Measurements: 0.5cm length x 0.7cm width x 0.2cm depth; 0.055cm^3 volume. Character of Wound/Ulcer Post Debridement is improved. Severity of Tissue Post Debridement is: Fat layer exposed. Post procedure Diagnosis Wound #2: Same as Pre-Procedure Plan Wound Cleansing: Wound #2 Right Calcaneus: Clean wound with Normal Saline. Anesthetic (add to Medication List): Wound #2 Right Calcaneus: Topical Lidocaine 4% cream applied to wound bed prior to debridement  (In Clinic Only). Benzocaine Topical Anesthetic Spray applied to wound bed prior to debridement (In Clinic Only). Primary Wound Dressing: Wound #2 Right Calcaneus: Santyl Ointment Secondary Dressing: Wound #2 Right Calcaneus: Dry Gauze Boardered Foam Dressing Other - Heel cup Dressing Change Frequency: Wound #2 Right Calcaneus: Change dressing every day. Follow-up Appointments: Wound #2 Right Calcaneus: Return Appointment in 1 week. Off-Loading: Wound #2 Right Calcaneus: Open toe surgical shoe to: Radiology ordered were: X-ray, foot - Right lateral foot wound- foot 3 view Laboratory ordered were: Wound culture routine - Right lateral foot #1 exceptionally painful small wound on the right lateral calcaneus which according the patient is very similar to what he had previously. More interestingly even this is the area of erythema around the wound that is very significantly tender to the James Moreno, James R. ([id number]) patient. I had some concerns about a periwound infection and was going to consider putting him on them. Antibiotics but I couldn't find anything oral that didn't list this having a significant interaction with either CellCept or Prograf. I know quinolones are contraindicated here but I'm not certain about how significant the rest of these interactions are #2 ordered an x-ray of the heel area. In addition to bone irregularities would wonder about soft tissue calcifications in a patient with class IV chronic renal failure #3. A culture but did not give him empiric antibiotics. If this comes back growing significant amounts of organisms I will need help with even oral antibiotics perhaps from his nephrologist who is apparently in [place] #4 the patient was reviewed by a Dr. long vascular surgery at [facility] and according to the patient he did not think he needed invasive angiography although at that point the wounds on his heels were closed. The patient describes  moderate claudication in his legs. I think if this continues he may need reconsideration of an angiogram Electronic Signature(s) Signed: 02/12/2018 5:31:58 PM By: Linton Ham MD Entered By: Linton Ham on 02/12/2018 14:49:39 James Moreno ([id number]) -------------------------------------------------------------------------------- ROS/PFSH Details Patient Name: James Moreno. Date of Service: 02/12/2018 1:00 PM Medical Record Number: [id number] Patient Account Number: [id number] Date of Birth/Sex: 1965-11-12 (52 y.o. M) Treating RN: Secundino Ginger Primary Care Provider: Lelon Huh Other Clinician: Referring Provider: Lelon Huh Treating Provider/Extender: Tito Dine in Treatment: 0 Information Obtained From Patient Wound History Do you currently have one or more open woundso No Have you tested positive for  osteomyelitis (bone infection)o No Have you had any tests for circulation on your legso No Eyes Complaints and Symptoms: Negative for: Dry Eyes; Vision Changes; Glasses / Contacts Medical History: Negative for: Cataracts; Glaucoma; Optic Neuritis Ear/Nose/Mouth/Throat Complaints and Symptoms: Negative for: Difficult clearing ears; Sinusitis Medical History: Negative for: Chronic sinus problems/congestion; Middle ear problems Hematologic/Lymphatic Complaints and Symptoms: Negative for: Bleeding / Clotting Disorders; Human Immunodeficiency Virus Medical History: Negative for: Anemia; Hemophilia; Human Immunodeficiency Virus; Lymphedema; Sickle Cell Disease Respiratory Complaints and Symptoms: Negative for: Chronic or frequent coughs; Shortness of Breath Medical History: Negative for: Aspiration; Asthma; Chronic Obstructive Pulmonary Disease (COPD); Pneumothorax; Sleep Apnea; Tuberculosis Cardiovascular Complaints and Symptoms: Negative for: Chest pain; LE edema Medical History: Positive for: Hypertension Negative for: Angina; Arrhythmia;  Congestive Heart Failure; Coronary Artery Disease; Deep Vein Thrombosis; Hypotension; Myocardial Infarction; Peripheral Arterial Disease; Peripheral Venous Disease; Phlebitis; Vasculitis James Moreno, PROSPERI. ([id number]) Gastrointestinal Complaints and Symptoms: Negative for: Frequent diarrhea; Nausea; Vomiting Medical History: Positive for: Hepatitis B Negative for: Cirrhosis ; Colitis; Crohnos; Hepatitis A; Hepatitis C Endocrine Complaints and Symptoms: Positive for: Hepatitis - 30 years ago hep B Negative for: Thyroid disease; Polydypsia (Excessive Thirst) Medical History: Positive for: Type II Diabetes - 7.4% Negative for: Type I Diabetes Time with diabetes: 30 years Treated with: Insulin Blood sugar tested every day: No Blood sugar testing results: Breakfast: 120 Immunological Complaints and Symptoms: Negative for: Hives; Itching Medical History: Negative for: Lupus Erythematosus; Raynaudos; Scleroderma Integumentary (Skin) Complaints and Symptoms: Negative for: Wounds; Bleeding or bruising tendency; Breakdown; Swelling Medical History: Negative for: History of Burn; History of pressure wounds Musculoskeletal Complaints and Symptoms: Negative for: Muscle Pain; Muscle Weakness Medical History: Negative for: Gout; Rheumatoid Arthritis; Osteoarthritis; Osteomyelitis Neurologic Complaints and Symptoms: Negative for: Numbness/parasthesias; Focal/Weakness Medical History: Negative for: Dementia; Neuropathy; Quadriplegia; Paraplegia; Seizure Disorder Genitourinary Medical History: Negative for: End Stage Renal Disease AALIJAH, LANPHERE ([id number]) Past Medical History Notes: kidney transplant 07/24/2014 Oncologic Medical History: Negative for: Received Chemotherapy; Received Radiation Psychiatric Medical History: Negative for: Anorexia/bulimia; Confinement Anxiety Immunizations Pneumococcal Vaccine: Received Pneumococcal Vaccination: Yes Tetanus Vaccine: Last  tetanus shot: 10/24/2016 Implantable Devices Hospitalization / Surgery History Name of Hospital Purpose of Hospitalization/Surgery Date [facility] transplant 06/24/2016 Family and Social History Cancer: Yes - Father; Diabetes: Yes - Father,Paternal Grandparents; Heart Disease: No; Hereditary Spherocytosis: No; Hypertension: No; Kidney Disease: Yes; Lung Disease: Yes; Seizures: No; Stroke: No; Thyroid Problems: No; Tuberculosis: No; Current every day smoker - 1/2 pack daily; Marital Status - Married; Alcohol Use: Rarely; Drug Use: No History; Caffeine Use: Daily; Financial Concerns: No; Food, Clothing or Shelter Needs: No; Support System Lacking: No; Transportation Concerns: No; Advanced Directives: Yes (Not Provided); Patient does not want information on Advanced Directives; Do not resuscitate: No; Living Will: Yes (Not Provided); Medical Power of Attorney: Yes (Not Provided) Electronic Signature(s) Signed: 02/12/2018 4:20:57 PM By: Secundino Ginger Signed: 02/12/2018 5:31:58 PM By: Linton Ham MD Entered By: Secundino Ginger on 02/12/2018 13:24:26 James Moreno ([id number]) -------------------------------------------------------------------------------- [place] Details Patient Name: PAMELA, MADDY. Date of Service: 02/12/2018 Medical Record Number: [id number] Patient Account Number: [id number] Date of Birth/Sex: March 01, 1966 (52 y.o. 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 limited Modifier: to breakdown of Quantity: 1 skin Physician Procedures CPT4 Code Description: 3014996 99213 - WC PHYS LEVEL 3 - EST PT ICD-10 Diagnosis Description E11.621 Type 2 diabetes mellitus with foot ulcer 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

## 2018-02-14 NOTE — Progress Notes (Signed)
KASHON, KRAYNAK ([id number]) Visit Report for 02/12/2018 Allergy List Details Patient Name: DURANTE, VIOLETT. Date of Service: 02/12/2018 1:00 PM Medical Record Number: [id number] Patient Account Number: [id number] Date of Birth/Sex: [birth year] (52 y.o. M) Treating RN: Secundino Ginger Primary Care Rankin Coolman: Lelon Huh Other Clinician: Referring Jamani Bearce: Referral, Self Treating Mysha Peeler/Extender: Ricard Dillon Weeks in Treatment: 0 Allergies Active Allergies No Known Allergies Allergy Notes Electronic Signature(s) Signed: 02/12/2018 4:20:57 PM By: Secundino Ginger Entered By: Secundino Ginger on 02/12/2018 13:18:41 [name] ([id number]) -------------------------------------------------------------------------------- Arrival Information Details Patient Name: HIROYUKI, OZANICH. Date of Service: 02/12/2018 1:00 PM Medical Record Number: [id number] Patient Account Number: [id number] Date of Birth/Sex: [birth year] (52 y.o. M) Treating RN: Secundino Ginger Primary Care Neilah Fulwider: Lelon Huh Other Clinician: Referring Sincere Liuzzi: Referral, Self Treating Byrant Valent/Extender: Tito Dine in Treatment: 0 Visit Information Patient Arrived: Ambulatory Arrival Time: 13:13 Accompanied By: self Transfer Assistance: None Patient Identification Verified: Yes Secondary Verification Process Yes Completed: Patient Has Alerts: Yes Patient Alerts: ABI 10/31/17 L 1.06 R 1.37 TBI L .3 R .48 History Since Last Visit Added or deleted any medications: No Any new allergies or adverse reactions: No Had a fall or experienced change in activities of daily living that may affect risk of falls: No Signs or symptoms of abuse/neglect since last visito No Hospitalized since last visit: No Implantable device outside of the clinic excluding cellular tissue based products placed in the center since last visit: No Has Dressing in Place as Prescribed: Yes Notes bandaid Electronic Signature(s) Signed:  02/12/2018 1:29:02 PM By: Montey Hora Entered By: Montey Hora on 02/12/2018 13:29:01 [name] ([id number]) -------------------------------------------------------------------------------- Clinic Level of Care Assessment Details Patient Name: [name]. Date of Service: 02/12/2018 1:00 PM Medical Record Number: [id number] Patient Account Number: [id number] Date of Birth/Sex: 1965/12/18 (52 y.o. M) Treating RN: Cornell Barman Primary Care Dessie Tatem: Lelon Huh Other Clinician: Referring Kaelon Weekes: Referral, Self Treating Keelon Zurn/Extender: Tito Dine in Treatment: 0 Clinic Level of Care Assessment Items TOOL 1 Quantity Score []  - Use when EandM and Procedure is performed on INITIAL visit 0 ASSESSMENTS - Nursing Assessment / Reassessment X - General Physical Exam (combine w/ comprehensive assessment (listed just below) when 1 20 performed on new pt. evals) X- 1 25 Comprehensive Assessment (HX, ROS, Risk Assessments, Wounds Hx, etc.) ASSESSMENTS - Wound and Skin Assessment / Reassessment []  - Dermatologic / Skin Assessment (not related to wound area) 0 ASSESSMENTS - Ostomy and/or Continence Assessment and Care []  - Incontinence Assessment and Management 0 []  - 0 Ostomy Care Assessment and Management (repouching, etc.) PROCESS - Coordination of Care X - Simple Patient / Family Education for ongoing care 1 15 []  - 0 Complex (extensive) Patient / Family Education for ongoing care X- 1 10 Staff obtains Programmer, systems, Records, Test Results / Process Orders []  - 0 Staff telephones HHA, Nursing Homes / Clarify orders / etc []  - 0 Routine Transfer to another Facility (non-emergent condition) []  - 0 Routine Hospital Admission (non-emergent condition) X- 1 15 New Admissions / Biomedical engineer / Ordering NPWT, Apligraf, etc. []  - 0 Emergency Hospital Admission (emergent condition) PROCESS - Special Needs []  - Pediatric / Minor Patient Management 0 []   - 0 Isolation Patient Management []  - 0 Hearing / Language / Visual special needs []  - 0 Assessment of Community assistance (transportation, D/C planning, etc.) []  - 0 Additional assistance / Altered mentation []  - 0 Support Surface(s) Assessment (bed, cushion, seat, etc.) CALLUM, WOLF R. ([id number]) INTERVENTIONS -  Miscellaneous []  - External ear exam 0 []  - 0 Patient Transfer (multiple staff / Civil Service fast streamer / Similar devices) []  - 0 Simple Staple / Suture removal (25 or less) []  - 0 Complex Staple / Suture removal (26 or more) []  - 0 Hypo/Hyperglycemic Management (do not check if billed separately) X- 1 15 Ankle / Brachial Index (ABI) - do not check if billed separately Has the patient been seen at the hospital within the last three years: Yes Total Score: 100 Level Of Care: New/Established - Level 3 Electronic Signature(s) Signed: 02/12/2018 5:21:06 PM By: Gretta Cool, BSN, RN, CWS, Kim RN, BSN Entered By: Gretta Cool, BSN, RN, CWS, Kim on 02/12/2018 13:48:09 [name] ([id number]) -------------------------------------------------------------------------------- Lower Extremity Assessment Details Patient Name: LEEVI, CULLARS. Date of Service: 02/12/2018 1:00 PM Medical Record Number: [id number] Patient Account Number: [id number] Date of Birth/Sex: 05-10-1965 (52 y.o. M) Treating RN: Montey Hora Primary Care Sybilla Malhotra: Lelon Huh Other Clinician: Referring Hudson Majkowski: Referral, Self Treating Betsabe Iglesia/Extender: Tito Dine in Treatment: 0 Edema Assessment Assessed: [Left: No] [Right: No] [Left: Edema] [Right: :] Calf Left: Right: Point of Measurement: 34 cm From Medial Instep cm 38.6 cm Ankle Left: Right: Point of Measurement: 12 cm From Medial Instep cm 22.6 cm Vascular Assessment Pulses: Dorsalis Pedis Palpable: [Right:Yes] Posterior Tibial Palpable: [Right:Yes] Extremity colors, hair growth, and conditions: Extremity Color: [Right:Normal] Hair  Growth on Extremity: [Right:Yes] Temperature of Extremity: [Right:Warm] Capillary Refill: [Right:< 3 seconds] Toe Nail Assessment Left: Right: Thick: Yes Discolored: Yes Deformed: No Improper Length and Hygiene: No Notes ABI 10/31/17 R 1.37 L 1.06 with TBI R .48 and L .3 Electronic Signature(s) Signed: 02/12/2018 1:28:15 PM By: Montey Hora Entered By: Montey Hora on 02/12/2018 13:28:15 [name] ([id number]) -------------------------------------------------------------------------------- Multi Wound Chart Details Patient Name: [name]. Date of Service: 02/12/2018 1:00 PM Medical Record Number: [id number] Patient Account Number: [id number] Date of Birth/Sex: 1966/01/05 (52 y.o. M) Treating RN: Secundino Ginger Primary Care Sontee Desena: Lelon Huh Other Clinician: Referring Harless Molinari: Referral, Self Treating Yusuke Beza/Extender: Tito Dine in Treatment: 0 Vital Signs Height(in): 71 Pulse(bpm): 88 Weight(lbs): 238 Blood Pressure(mmHg): 160/87 Body Mass Index(BMI): 33 Temperature(F): 98.2 Respiratory Rate 16 (breaths/min): Photos: [N/A:N/A] Wound Location: Right Calcaneus N/A N/A Wounding Event: Gradually Appeared N/A N/A Primary Etiology: Diabetic Wound/Ulcer of the N/A N/A Lower Extremity Comorbid History: Hypertension, Hepatitis B, N/A N/A Type II Diabetes Date Acquired: 01/22/2018 N/A N/A Weeks of Treatment: 0 N/A N/A Wound Status: Open N/A N/A Measurements L x W x D 0.5x0.7x0.1 N/A N/A (cm) Area (cm) : 0.275 N/A N/A Volume (cm) : 0.027 N/A N/A Classification: Grade 1 N/A N/A Exudate Amount: Medium N/A N/A Exudate Type: Serosanguineous N/A N/A Exudate Color: red, brown N/A N/A Wound Margin: Flat and Intact N/A N/A Granulation Amount: None Present (0%) N/A N/A Necrotic Amount: Large (67-100%) N/A N/A Exposed Structures: Fat Layer (Subcutaneous N/A N/A Tissue) Exposed: Yes Fascia: No Tendon: No Muscle: No Joint: No Bone:  No Epithelialization: None N/A N/A Debridement: Debridement - Excisional N/A N/A WALLICE, GRANVILLE ([id number]) Pre-procedure 13:39 N/A N/A Verification/Time Out Taken: Pain Control: Lidocaine 4% Topical Solution N/A N/A Tissue Debrided: Subcutaneous N/A N/A Level: Skin/Subcutaneous Tissue N/A N/A Debridement Area (sq cm): 0.35 N/A N/A Instrument: Curette N/A N/A Specimen: Swab N/A N/A Number of Specimens 1 N/A N/A Taken: Bleeding: None N/A N/A Procedural Pain: 5 N/A N/A Post Procedural Pain: 1 N/A N/A Debridement Treatment Procedure was tolerated well N/A N/A Response: Post Debridement 0.5x0.7x0.2 N/A N/A Measurements  L x W x D (cm) Post Debridement Volume: 0.055 N/A N/A (cm) Periwound Skin Texture: Scarring: Yes N/A N/A Excoriation: No Induration: No Callus: No Crepitus: No Rash: No Periwound Skin Moisture: Maceration: No N/A N/A Dry/Scaly: No Periwound Skin Color: Erythema: Yes N/A N/A Atrophie Blanche: No Cyanosis: No Ecchymosis: No Hemosiderin Staining: No Mottled: No Pallor: No Rubor: No Erythema Location: Circumferential N/A N/A Temperature: No Abnormality N/A N/A Tenderness on Palpation: Yes N/A N/A Wound Preparation: Ulcer Cleansing: N/A N/A Rinsed/Irrigated with Saline Topical Anesthetic Applied: Other: lidocaine 4% Procedures Performed: Debridement N/A N/A Treatment Notes Electronic Signature(s) Signed: 02/12/2018 5:31:58 PM By: Linton Ham MD Entered By: Linton Ham on 02/12/2018 14:04:06 [name] ([id number]) -------------------------------------------------------------------------------- [place] Details Patient Name: JOSEGUADALUPE, STAN. Date of Service: 02/12/2018 1:00 PM Medical Record Number: [id number] Patient Account Number: [id number] Date of Birth/Sex: 06-11-1965 (52 y.o. M) Treating RN: Secundino Ginger Primary Care Abdulwahab Demelo: Lelon Huh Other Clinician: Referring Sultana Tierney: Referral, Self Treating  Sendy Pluta/Extender: Tito Dine in Treatment: 0 Active Inactive ` Wound/Skin Impairment Nursing Diagnoses: Impaired tissue integrity Knowledge deficit related to smoking impact on wound healing Knowledge deficit related to ulceration/compromised skin integrity Goals: Patient/caregiver will verbalize understanding of skin care regimen Date Initiated: 02/12/2018 Target Resolution Date: 03/14/2018 Goal Status: Active Ulcer/skin breakdown will have a volume reduction of 30% by week 4 Date Initiated: 02/12/2018 Target Resolution Date: 03/14/2018 Goal Status: Active Interventions: Assess patient/caregiver ability to obtain necessary supplies Assess patient/caregiver ability to perform ulcer/skin care regimen upon admission and as needed Notes: Electronic Signature(s) Signed: 02/12/2018 4:20:57 PM By: Secundino Ginger Signed: 02/12/2018 5:21:06 PM By: Gretta Cool, BSN, RN, CWS, Kim RN, BSN Entered By: Gretta Cool, BSN, RN, CWS, Kim on 02/12/2018 13:37:08 [name] ([id number]) -------------------------------------------------------------------------------- Pain Assessment Details Patient Name: ANKITH, EDMONSTON. Date of Service: 02/12/2018 1:00 PM Medical Record Number: [id number] Patient Account Number: [id number] Date of Birth/Sex: [birth year] (52 y.o. M) Treating RN: Secundino Ginger Primary Care Alrick Cubbage: Lelon Huh Other Clinician: Referring Zorion Nims: Referral, Self Treating Kacey Dysert/Extender: Tito Dine in Treatment: 0 Active Problems Location of Pain Severity and Description of Pain Patient Has Paino Yes Site Locations Duration of the Pain. Constant / Intermittento Constant Rate the pain. Current Pain Level: 10 Worst Pain Level: 10 Least Pain Level: 10 Tolerable Pain Level: 0 Character of Pain Describe the Pain: Dull Pain Management and Medication Current Pain Management: Goals for Pain Management pt stated has generalized pain. enc to see primary  PRN. Electronic Signature(s) Signed: 02/12/2018 4:20:57 PM By: Secundino Ginger Entered By: Secundino Ginger on 02/12/2018 13:16:53 [name] ([id number]) -------------------------------------------------------------------------------- Patient/Caregiver Education Details Patient Name: HIMMAT, ENBERG. Date of Service: 02/12/2018 1:00 PM Medical Record Number: [id number] Patient Account Number: [id number] Date of Birth/Gender: 09/03/1965 (52 y.o. M) Treating RN: Cornell Barman Primary Care Physician: Lelon Huh Other Clinician: Referring Physician: Referral, Self Treating Physician/Extender: Tito Dine in Treatment: 0 Education Assessment Education Provided To: Patient Education Topics Provided Welcome To [facility]: Handouts: Welcome To [facility] Methods: Demonstration, Explain/Verbal Responses: State content correctly Wound/Skin Impairment: Handouts: Caring for Your Ulcer Methods: Demonstration, Explain/Verbal Responses: State content correctly Electronic Signature(s) Signed: 02/12/2018 5:21:06 PM By: Gretta Cool, BSN, RN, CWS, Kim RN, BSN Entered By: Gretta Cool, BSN, RN, CWS, Kim on 02/12/2018 13:48:45 [name] ([id number]) -------------------------------------------------------------------------------- Wound Assessment Details Patient Name: HURSHEL, BOUILLON. Date of Service: 02/12/2018 1:00 PM Medical Record Number: [id number] Patient Account Number: [id number] Date of Birth/Sex: 1965/12/13 (51  y.o. M) Treating RN: Montey Hora Primary Care Dawsyn Zurn: Lelon Huh Other Clinician: Referring Dorma Altman: Referral, Self Treating Ryder Chesmore/Extender: Tito Dine in Treatment: 0 Wound Status Wound Number: 2 Primary Etiology: Diabetic Wound/Ulcer of the Lower Extremity Wound Location: Right Calcaneus Wound Status: Open Wounding Event: Gradually Appeared Comorbid Hypertension, Hepatitis B, Type II Diabetes Date Acquired:  01/22/2018 History: Weeks Of Treatment: 0 Clustered Wound: No Photos Photo Uploaded By: Secundino Ginger on 02/12/2018 13:35:52 Wound Measurements Length: (cm) 0.5 % Reductio Width: (cm) 0.7 % Reductio Depth: (cm) 0.1 Epithelial Area: (cm) 0.275 Tunneling Volume: (cm) 0.027 Undermini n in Area: n in Volume: ization: None : No ng: No Wound Description Classification: Grade 1 Foul Odor Wound Margin: Flat and Intact Slough/Fi Exudate Amount: Medium Exudate Type: Serosanguineous Exudate Color: red, brown After Cleansing: No brino Yes Wound Bed Granulation Amount: None Present (0%) Exposed Structure Necrotic Amount: Large (67-100%) Fascia Exposed: No Necrotic Quality: Adherent Slough Fat Layer (Subcutaneous Tissue) Exposed: Yes Tendon Exposed: No Muscle Exposed: No Joint Exposed: No Bone Exposed: No Periwound Skin Texture KROSBY, RITCHIE R. ([id number]) Texture Color No Abnormalities Noted: No No Abnormalities Noted: No Callus: No Atrophie Blanche: No Crepitus: No Cyanosis: No Excoriation: No Ecchymosis: No Induration: No Erythema: Yes Rash: No Erythema Location: Circumferential Scarring: Yes Hemosiderin Staining: No Mottled: No Moisture Pallor: No No Abnormalities Noted: No Rubor: No Dry / Scaly: No Maceration: No Temperature / Pain Temperature: No Abnormality Tenderness on Palpation: Yes Wound Preparation Ulcer Cleansing: Rinsed/Irrigated with Saline Topical Anesthetic Applied: Other: lidocaine 4%, Electronic Signature(s) Signed: 02/12/2018 1:26:50 PM By: Montey Hora Entered By: Montey Hora on 02/12/2018 13:26:50 [name] ([id number]) -------------------------------------------------------------------------------- Vitals Details Patient Name: [name]. Date of Service: 02/12/2018 1:00 PM Medical Record Number: [id number] Patient Account Number: [id number] Date of Birth/Sex: 1966-01-31 (52 y.o. M) Treating RN: Secundino Ginger Primary  Care Katriona Schmierer: Lelon Huh Other Clinician: Referring Marcelus Dubberly: Referral, Self Treating Abhay Godbolt/Extender: Tito Dine in Treatment: 0 Vital Signs Time Taken: 13:17 Temperature (F): 98.2 Height (in): 71 Pulse (bpm): 88 Weight (lbs): 238 Respiratory Rate (breaths/min): 16 Body Mass Index (BMI): 33.2 Blood Pressure (mmHg): 160/87 Reference Range: 80 - 120 mg / dl Electronic Signature(s) Signed: 02/12/2018 4:20:57 PM By: Secundino Ginger Entered BySecundino Ginger on 02/12/2018 13:18:23

## 2018-02-14 NOTE — Progress Notes (Signed)
SEVERUS, BRODZINSKI ([id number]) Visit Report for 02/12/2018 Abuse/Suicide Risk Screen Details Patient Name: James Moreno, James Moreno. Date of Service: 02/12/2018 1:00 PM Medical Record Number: [id number] Patient Account Number: [id number] Date of Birth/Sex: [birth year] (52 y.o. M) Treating RN: Secundino Ginger Primary Care Earnstine Meinders: Lelon Huh Other Clinician: Referring Laraina Sulton: Referral, Self Treating Dowell Hoon/Extender: Tito Dine in Treatment: 0 Abuse/Suicide Risk Screen Items Answer ABUSE/SUICIDE RISK SCREEN: Has anyone close to you tried to hurt or harm you recentlyo No Do you feel uncomfortable with anyone in your familyo No Has anyone forced you do things that you didnot want to doo No Do you have any thoughts of harming yourselfo No Patient displays signs or symptoms of abuse and/or neglect. No Electronic Signature(s) Signed: 02/12/2018 4:20:57 PM By: Secundino Ginger Entered By: Secundino Ginger on 02/12/2018 13:25:09 [name] ([id number]) -------------------------------------------------------------------------------- Activities of Daily Living Details Patient Name: James Moreno. Date of Service: 02/12/2018 1:00 PM Medical Record Number: [id number] Patient Account Number: [id number] Date of Birth/Sex: [birth year] (52 y.o. M) Treating RN: Secundino Ginger Primary Care Linder Prajapati: Lelon Huh Other Clinician: Referring Dennice Tindol: Referral, Self Treating Aleasha Fregeau/Extender: Tito Dine in Treatment: 0 Activities of Daily Living Items Answer Activities of Daily Living (Please select one for each item) Drive Automobile Completely Able Take Medications Completely Able Use Telephone Completely Able Care for Appearance Completely Able Use Toilet Completely Able Bath / Shower Completely Able Dress Self Completely Able Feed Self Completely Able Walk Completely Able Get In / Out Bed Completely Able Housework Completely Able Prepare Meals Completely [place] for Self Completely Able Electronic Signature(s) Signed: 02/12/2018 4:20:57 PM By: Secundino Ginger Entered By: Secundino Ginger on 02/12/2018 13:25:47 [name] ([id number]) -------------------------------------------------------------------------------- Education Assessment Details Patient Name: [name]. Date of Service: 02/12/2018 1:00 PM Medical Record Number: [id number] Patient Account Number: [id number] Date of Birth/Sex: Aug 14, 1965 (52 y.o. M) Treating RN: Secundino Ginger Primary Care Mailey Landstrom: Lelon Huh Other Clinician: Referring Ichiro Chesnut: Referral, Self Treating Chapel Silverthorn/Extender: Tito Dine in Treatment: 0 Learning Preferences/Education Level/Primary Language Learning Preference: Explanation, Demonstration Highest Education Level: College or Above Preferred Language: English Cognitive Barrier Assessment/Beliefs Language Barrier: Yes Physical Barrier Assessment Impaired Vision: No Impaired Hearing: No Decreased Hand dexterity: No Knowledge/Comprehension Assessment Knowledge Level: High Comprehension Level: High Ability to understand written High instructions: Ability to understand verbal High instructions: Motivation Assessment Anxiety Level: Calm Cooperation: Cooperative Education Importance: Acknowledges Need Interest in Health Problems: Asks Questions Perception: Coherent Willingness to Engage in Self- High Management Activities: Readiness to Engage in Self- High Management Activities: Electronic Signature(s) Signed: 02/12/2018 4:20:57 PM By: Secundino Ginger Entered By: Secundino Ginger on 02/12/2018 13:27:09 [name] ([id number]) -------------------------------------------------------------------------------- Fall Risk Assessment Details Patient Name: [name]. Date of Service: 02/12/2018 1:00 PM Medical Record Number: [id number] Patient Account Number: [id number] Date of Birth/Sex: July 10, 1965 (51 y.o.  M) Treating RN: Secundino Ginger Primary Care Swara Donze: Lelon Huh Other Clinician: Referring Rosely Fernandez: Referral, Self Treating Insiya Oshea/Extender: Tito Dine in Treatment: 0 Fall Risk Assessment Items Have you had 2 or more falls in the last 12 monthso 0 No Have you had any fall that resulted in injury in the last 12 monthso 0 No FALL RISK ASSESSMENT: History of falling - immediate or within 3 months 0 No Secondary diagnosis 0 No Ambulatory aid None/bed rest/wheelchair/nurse 0 No Crutches/cane/walker 0 No Furniture 0 No IV Access/Saline Lock 0 No Gait/Training Normal/bed rest/immobile 0 No Weak 0 No Impaired 0 No Mental  Status Oriented to own ability 0 No Electronic Signature(s) Signed: 02/12/2018 4:20:57 PM By: Secundino Ginger Entered By: Secundino Ginger on 02/12/2018 13:27:24 [name] ([id number]) -------------------------------------------------------------------------------- Foot Assessment Details Patient Name: James, Moreno. Date of Service: 02/12/2018 1:00 PM Medical Record Number: [id number] Patient Account Number: [id number] Date of Birth/Sex: December 29, 1965 (52 y.o. M) Treating RN: Secundino Ginger Primary Care Tyrianna Lightle: Lelon Huh Other Clinician: Referring Bralin Garry: Referral, Self Treating Kylia Grajales/Extender: Tito Dine in Treatment: 0 Foot Assessment Items Site Locations + = Sensation present, - = Sensation absent, C = Callus, U = Ulcer R = Redness, W = Warmth, M = Maceration, PU = Pre-ulcerative lesion F = Fissure, S = Swelling, D = Dryness Assessment Right: Left: Other Deformity: No No Prior Foot Ulcer: No No Prior Amputation: No No Charcot Joint: No No Ambulatory Status: Gait: Electronic Signature(s) Signed: 02/12/2018 4:20:57 PM By: Secundino Ginger Entered By: Secundino Ginger on 02/12/2018 13:28:30 [name] ([id number]) -------------------------------------------------------------------------------- Nutrition Risk Assessment  Details Patient Name: [name]. Date of Service: 02/12/2018 1:00 PM Medical Record Number: [id number] Patient Account Number: [id number] Date of Birth/Sex: 06/12/1965 (52 y.o. M) Treating RN: Secundino Ginger Primary Care Marquet Faircloth: Lelon Huh Other Clinician: Referring Kasheena Sambrano: Referral, Self Treating Khizar Fiorella/Extender: Tito Dine in Treatment: 0 Height (in): 71 Weight (lbs): 238 Body Mass Index (BMI): 33.2 Nutrition Risk Assessment Items NUTRITION RISK SCREEN: I have an illness or condition that made me change the kind and/or amount of 0 No food I eat I eat fewer than two meals per day 0 No I eat few fruits and vegetables, or milk products 0 No I have three or more drinks of beer, liquor or wine almost every day 0 No I have tooth or mouth problems that make it hard for me to eat 0 No I don't always have enough money to buy the food I need 0 No I eat alone most of the time 0 No I take three or more different prescribed or over-the-counter drugs a day 0 No Without wanting to, I have lost or gained 10 pounds in the last six months 0 No I am not always physically able to shop, cook and/or feed myself 0 No Nutrition Protocols Good Risk Protocol Moderate Risk Protocol Electronic Signature(s) Signed: 02/12/2018 4:20:57 PM By: Secundino Ginger Entered By: Secundino Ginger on 02/12/2018 13:27:31

## 2018-02-18 LAB — AEROBIC/ANAEROBIC CULTURE W GRAM STAIN (SURGICAL/DEEP WOUND)

## 2018-02-18 LAB — AEROBIC/ANAEROBIC CULTURE (SURGICAL/DEEP WOUND)

## 2018-02-19 ENCOUNTER — Encounter: Payer: Medicare Other | Admitting: Family Medicine

## 2018-02-19 DIAGNOSIS — I130 Hypertensive heart and chronic kidney disease with heart failure and stage 1 through stage 4 chronic kidney disease, or unspecified chronic kidney disease: Secondary | ICD-10-CM | POA: Diagnosis not present

## 2018-02-19 DIAGNOSIS — E11621 Type 2 diabetes mellitus with foot ulcer: Secondary | ICD-10-CM | POA: Diagnosis not present

## 2018-02-19 DIAGNOSIS — N183 Chronic kidney disease, stage 3 (moderate): Secondary | ICD-10-CM | POA: Diagnosis not present

## 2018-02-19 DIAGNOSIS — L97411 Non-pressure chronic ulcer of right heel and midfoot limited to breakdown of skin: Secondary | ICD-10-CM | POA: Diagnosis not present

## 2018-02-19 DIAGNOSIS — F17210 Nicotine dependence, cigarettes, uncomplicated: Secondary | ICD-10-CM | POA: Diagnosis not present

## 2018-02-19 DIAGNOSIS — Z940 Kidney transplant status: Secondary | ICD-10-CM | POA: Diagnosis not present

## 2018-02-19 DIAGNOSIS — L97412 Non-pressure chronic ulcer of right heel and midfoot with fat layer exposed: Secondary | ICD-10-CM | POA: Diagnosis not present

## 2018-02-24 NOTE — Progress Notes (Signed)
James Moreno ([id number]) Visit Report for 02/19/2018 Arrival Information Details Patient Name: James Moreno, James Moreno. Date of Service: 02/19/2018 1:00 PM Medical Record Number: [id number] Patient Account Number: [id number] Date of Birth/Sex: Feb 11, 1966 (51 y.o. M) Treating RN: Montey Hora Primary Care Reyn Faivre: Lelon Huh Other Clinician: Referring Rinoa Garramone: Lelon Huh Treating Marckus Hanover/Extender: Oneida Arenas in Treatment: 1 Visit Information History Since Last Visit Added or deleted any medications: No Patient Arrived: Ambulatory Any new allergies or adverse reactions: No Arrival Time: 13:08 Had a fall or experienced change in No Accompanied By: self activities of daily living that may affect Transfer Assistance: None risk of falls: Patient Identification Verified: Yes Signs or symptoms of abuse/neglect since last visito No Secondary Verification Process Yes Hospitalized since last visit: No Completed: Implantable device outside of the clinic excluding No Patient Has Alerts: Yes cellular tissue based products placed in the center Patient Alerts: ABI 10/31/17 L 1.06 R since last visit: 1.37 Has Dressing in Place as Prescribed: Yes TBI L .3 R .48 Pain Present Now: Yes Electronic Signature(s) Signed: 02/19/2018 3:52:47 PM By: Montey Hora Entered By: Montey Hora on 02/19/2018 13:08:45 James Moreno ([id number]) -------------------------------------------------------------------------------- Encounter Discharge Information Details Patient Name: James Moreno. Date of Service: 02/19/2018 1:00 PM Medical Record Number: [id number] Patient Account Number: [id number] Date of Birth/Sex: 12-14-1965 (51 y.o. M) Treating RN: Cornell Barman Primary Care Garrie Woodin: Lelon Huh Other Clinician: Referring Jachin Coury: Lelon Huh Treating Akaash Vandewater/Extender: Oneida Arenas in Treatment: 1 Encounter Discharge Information Items Post Procedure  Vitals Discharge Condition: Stable Temperature (F): 98.0 Ambulatory Status: Ambulatory Pulse (bpm): 100 Discharge Destination: Home Respiratory Rate (breaths/min): 16 Transportation: Private Auto Blood Pressure (mmHg): 135/76 Accompanied By: self Schedule Follow-up Appointment: No Clinical Summary of Care: Electronic Signature(s) Signed: 02/19/2018 1:54:19 PM By: Gretta Cool, BSN, RN, CWS, Kim RN, BSN Entered By: Gretta Cool, BSN, RN, CWS, Kim on 02/19/2018 13:54:18 James Moreno ([id number]) -------------------------------------------------------------------------------- Lower Extremity Assessment Details Patient Name: James Moreno. Date of Service: 02/19/2018 1:00 PM Medical Record Number: [id number] Patient Account Number: [id number] Date of Birth/Sex: December 22, 1965 (51 y.o. M) Treating RN: Montey Hora Primary Care Leialoha Hanna: Lelon Huh Other Clinician: Referring Danecia Underdown: Lelon Huh Treating Kadeidra Coryell/Extender: Beather Arbour Weeks in Treatment: 1 Vascular Assessment Pulses: Dorsalis Pedis Palpable: [Right:Yes] Posterior Tibial Extremity colors, hair growth, and conditions: Extremity Color: [Right:Hyperpigmented] Hair Growth on Extremity: [Right:No] Temperature of Extremity: [Right:Warm] Capillary Refill: [Right:< 3 seconds] Toe Nail Assessment Left: Right: Thick: Yes Discolored: Yes Deformed: Yes Improper Length and Hygiene: No Electronic Signature(s) Signed: 02/19/2018 3:52:47 PM By: Montey Hora Entered By: Montey Hora on 02/19/2018 13:15:34 James Moreno ([id number]) -------------------------------------------------------------------------------- Multi Wound Chart Details Patient Name: James Moreno. Date of Service: 02/19/2018 1:00 PM Medical Record Number: [id number] Patient Account Number: [id number] Date of Birth/Sex: 1965-05-12 (51 y.o. M) Treating RN: Cornell Barman Primary Care Uvaldo Rybacki: Lelon Huh Other Clinician: Referring Xoie Kreuser:  Lelon Huh Treating Nabilah Davoli/Extender: Beather Arbour Weeks in Treatment: 1 Vital Signs Height(in): 71 Pulse(bpm): 100 Weight(lbs): 238 Blood Pressure(mmHg): 135/73 Body Mass Index(BMI): 33 Temperature(F): 98.0 Respiratory Rate 16 (breaths/min): Photos: [2:No Photos] [N/A:N/A] Wound Location: [2:Right Calcaneus] [N/A:N/A] Wounding Event: [2:Gradually Appeared] [N/A:N/A] Primary Etiology: [2:Diabetic Wound/Ulcer of the Lower Extremity] [N/A:N/A] Comorbid History: [2:Hypertension, Hepatitis B, Type II Diabetes] [N/A:N/A] Date Acquired: [2:01/22/2018] [N/A:N/A] Weeks of Treatment: [2:1] [N/A:N/A] Wound Status: [2:Open] [N/A:N/A] Measurements L x W x D [2:0.8x0.7x0.1] [N/A:N/A] (cm) Area (cm) : [2:0.44] [N/A:N/A] Volume (cm) : [2:0.044] [N/A:N/A] % Reduction in Area: [2:-60.00%] [N/A:N/A] % Reduction in Volume: [2:-63.00%] [N/A:N/A]  Classification: [2:Grade 1] [N/A:N/A] Exudate Amount: [2:Medium] [N/A:N/A] Exudate Type: [2:Serosanguineous] [N/A:N/A] Exudate Color: [2:red, brown] [N/A:N/A] Wound Margin: [2:Flat and Intact] [N/A:N/A] Granulation Amount: [2:None Present (0%)] [N/A:N/A] Necrotic Amount: [2:Large (67-100%)] [N/A:N/A] Exposed Structures: [2:Fat Layer (Subcutaneous Tissue) Exposed: Yes Fascia: No Tendon: No Muscle: No Joint: No Bone: No] [N/A:N/A] Epithelialization: [2:None] [N/A:N/A] Debridement: [2:Debridement - Excisional] [N/A:N/A] Pre-procedure [2:13:39] [N/A:N/A] Verification/Time Out Taken: Pain Control: [2:Lidocaine] [N/A:N/A] Tissue Debrided: [2:Subcutaneous, Slough] [N/A:N/A] Level: [2:Skin/Subcutaneous Tissue] [N/A:N/A] Debridement Area (sq cm): 0.56 N/A N/A Instrument: Curette N/A N/A Bleeding: Minimum N/A N/A Hemostasis Achieved: Pressure N/A N/A Debridement Treatment Procedure was tolerated well N/A N/A Response: Post Debridement 0.8x0.7x0.2 N/A N/A Measurements L x W x D (cm) Post Debridement Volume: 0.088 N/A N/A (cm) Periwound  Skin Texture: Scarring: Yes N/A N/A Excoriation: No Induration: No Callus: No Crepitus: No Rash: No Periwound Skin Moisture: Maceration: No N/A N/A Dry/Scaly: No Periwound Skin Color: Erythema: Yes N/A N/A Atrophie Blanche: No Cyanosis: No Ecchymosis: No Hemosiderin Staining: No Mottled: No Pallor: No Rubor: No Erythema Location: Circumferential N/A N/A Temperature: No Abnormality N/A N/A Tenderness on Palpation: Yes N/A N/A Wound Preparation: Ulcer Cleansing: N/A N/A Rinsed/Irrigated with Saline Topical Anesthetic Applied: Other: lidocaine 4% Procedures Performed: Debridement N/A N/A Treatment Notes Electronic Signature(s) Signed: 02/23/2018 5:29:42 PM By: Beather Arbour FNP-C Entered By: Beather Arbour on 02/19/2018 13:46:53 Zambrana, Elyn Aquas ([id number]) -------------------------------------------------------------------------------- [place] Details Patient Name: CAMERIN, LADOUCEUR. Date of Service: 02/19/2018 1:00 PM Medical Record Number: [id number] Patient Account Number: [id number] Date of Birth/Sex: 12/04/1965 (51 y.o. M) Treating RN: Cornell Barman Primary Care Victorya Hillman: Lelon Huh Other Clinician: Referring Aiyla Baucom: Lelon Huh Treating Raliegh Scobie/Extender: Beather Arbour Weeks in Treatment: 1 Active Inactive Soft Tissue Infection Nursing Diagnoses: Potential for infection: soft tissue Goals: Signs and symptoms of infection will be recognized early to allow for prompt treatment Date Initiated: 02/19/2018 Target Resolution Date: 03/26/2018 Goal Status: Active Interventions: Assess signs and symptoms of infection every visit Treatment Activities: Culture and sensitivity : 02/19/2018 Systemic antibiotics : 02/19/2018 Notes: Wound/Skin Impairment Nursing Diagnoses: Impaired tissue integrity Knowledge deficit related to smoking impact on wound healing Knowledge deficit related to ulceration/compromised skin  integrity Goals: Patient/caregiver will verbalize understanding of skin care regimen Date Initiated: 02/12/2018 Target Resolution Date: 03/14/2018 Goal Status: Active Ulcer/skin breakdown will have a volume reduction of 30% by week 4 Date Initiated: 02/12/2018 Target Resolution Date: 03/14/2018 Goal Status: Active Interventions: Assess patient/caregiver ability to obtain necessary supplies Assess patient/caregiver ability to perform ulcer/skin care regimen upon admission and as needed Notes: Electronic Signature(s) ALANN, AVEY ([id number]) Signed: 02/19/2018 4:25:08 PM By: Gretta Cool, BSN, RN, CWS, Kim RN, BSN Entered By: Gretta Cool, BSN, RN, CWS, Kim on 02/19/2018 13:39:15 James Moreno ([id number]) -------------------------------------------------------------------------------- Pain Assessment Details Patient Name: GEARY, RUFO. Date of Service: 02/19/2018 1:00 PM Medical Record Number: [id number] Patient Account Number: [id number] Date of Birth/Sex: 06-01-1965 (51 y.o. M) Treating RN: Montey Hora Primary Care Aryel Edelen: Lelon Huh Other Clinician: Referring Christiaan Strebeck: Lelon Huh Treating Justis Closser/Extender: Beather Arbour Weeks in Treatment: 1 Active Problems Location of Pain Severity and Description of Pain Patient Has Paino Yes Site Locations Pain Location: Pain in Ulcers With Dressing Change: Yes Duration of the Pain. Constant / Intermittento Constant Pain Management and Medication Current Pain Management: Electronic Signature(s) Signed: 02/19/2018 3:52:47 PM By: Montey Hora Entered By: Montey Hora on 02/19/2018 13:08:56 James Moreno ([id number]) -------------------------------------------------------------------------------- Patient/Caregiver Education Details Patient Name: James Moreno. Date of Service: 02/19/2018 1:00 PM Medical Record Number: [id number]  Patient Account Number: [id number] Date of Birth/Gender: June 09, 1965 (51 y.o.  M) Treating RN: Cornell Barman Primary Care Physician: Lelon Huh Other Clinician: Referring Physician: Lelon Huh Treating Physician/Extender: Oneida Arenas in Treatment: 1 Education Assessment Education Provided To: Patient Education Topics Provided Wound Debridement: Handouts: Wound Debridement Methods: Demonstration, Explain/Verbal Responses: State content correctly Wound/Skin Impairment: Handouts: Caring for Your Ulcer Methods: Demonstration Responses: State content correctly Electronic Signature(s) Signed: 02/19/2018 4:25:08 PM By: Gretta Cool, BSN, RN, CWS, Kim RN, BSN Entered By: Gretta Cool, BSN, RN, CWS, Kim on 02/19/2018 13:42:28 ROMUALDO, PROSISE ([id number]) -------------------------------------------------------------------------------- Wound Assessment Details Patient Name: RISHARD, DELANGE. Date of Service: 02/19/2018 1:00 PM Medical Record Number: [id number] Patient Account Number: [id number] Date of Birth/Sex: December 18, 1965 (51 y.o. M) Treating RN: Montey Hora Primary Care Darrelle Barrell: Lelon Huh Other Clinician: Referring Shaindy Reader: Lelon Huh Treating Schneider Warchol/Extender: Beather Arbour Weeks in Treatment: 1 Wound Status Wound Number: 2 Primary Etiology: Diabetic Wound/Ulcer of the Lower Extremity Wound Location: Right Calcaneus Wound Status: Open Wounding Event: Gradually Appeared Comorbid Hypertension, Hepatitis B, Type II Diabetes Date Acquired: 01/22/2018 History: Weeks Of Treatment: 1 Clustered Wound: No Photos Photo Uploaded By: Montey Hora on 02/19/2018 14:59:34 Wound Measurements Length: (cm) 0.8 Width: (cm) 0.7 Depth: (cm) 0.1 Area: (cm) 0.44 Volume: (cm) 0.044 % Reduction in Area: -60% % Reduction in Volume: -63% Epithelialization: None Tunneling: No Undermining: No Wound Description Classification: Grade 1 Wound Margin: Flat and Intact Exudate Amount: Medium Exudate Type: Serosanguineous Exudate Color: red,  brown Foul Odor After Cleansing: No Slough/Fibrino Yes Wound Bed Granulation Amount: None Present (0%) Exposed Structure Necrotic Amount: Large (67-100%) Fascia Exposed: No Necrotic Quality: Adherent Slough Fat Layer (Subcutaneous Tissue) Exposed: Yes Tendon Exposed: No Muscle Exposed: No Joint Exposed: No Bone Exposed: No Periwound Skin Texture YOUSSOUF, SHIPLEY R. ([id number]) Texture Color No Abnormalities Noted: No No Abnormalities Noted: No Callus: No Atrophie Blanche: No Crepitus: No Cyanosis: No Excoriation: No Ecchymosis: No Induration: No Erythema: Yes Rash: No Erythema Location: Circumferential Scarring: Yes Hemosiderin Staining: No Mottled: No Moisture Pallor: No No Abnormalities Noted: No Rubor: No Dry / Scaly: No Maceration: No Temperature / Pain Temperature: No Abnormality Tenderness on Palpation: Yes Wound Preparation Ulcer Cleansing: Rinsed/Irrigated with Saline Topical Anesthetic Applied: Other: lidocaine 4%, Treatment Notes Wound #2 (Right Calcaneus) Notes Santyl, coverlet Electronic Signature(s) Signed: 02/19/2018 3:52:47 PM By: Montey Hora Entered By: Montey Hora on 02/19/2018 13:14:51 James Moreno ([id number]) -------------------------------------------------------------------------------- Vitals Details Patient Name: James Moreno. Date of Service: 02/19/2018 1:00 PM Medical Record Number: [id number] Patient Account Number: [id number] Date of Birth/Sex: 10/03/1965 (51 y.o. M) Treating RN: Montey Hora Primary Care Jadiel Schmieder: Lelon Huh Other Clinician: Referring Cashe Gatt: Lelon Huh Treating Longino Trefz/Extender: Beather Arbour Weeks in Treatment: 1 Vital Signs Time Taken: 13:10 Temperature (F): 98.0 Height (in): 71 Pulse (bpm): 100 Weight (lbs): 238 Respiratory Rate (breaths/min): 16 Body Mass Index (BMI): 33.2 Blood Pressure (mmHg): 135/73 Reference Range: 80 - 120 mg / dl Electronic Signature(s) Signed:  02/19/2018 3:52:47 PM By: Montey Hora Entered By: Montey Hora on 02/19/2018 13:12:22

## 2018-02-24 NOTE — Progress Notes (Signed)
CICERO, NOY ([id number]) Visit Report for 02/19/2018 Chief Complaint Document Details Patient Name: James Moreno, James Moreno. Date of Service: 02/19/2018 1:00 PM Medical Record Number: [id number] Patient Account Number: [id number] Date of Birth/Sex: 01/26/1966 (52 y.o. M) Treating RN: Cornell Barman Primary Care Provider: Lelon Huh Other Clinician: Referring Provider: Lelon Huh Treating Provider/Extender: Beather Arbour Weeks in Treatment: 1 Information Obtained from: Patient Chief Complaint right heel wound 02/12/18; patient returns with a new wound over the right lateral heel Electronic Signature(s) Signed: 02/23/2018 5:29:42 PM By: Beather Arbour FNP-C Entered By: Beather Arbour on 02/19/2018 13:47:45 James Moreno ([id number]) -------------------------------------------------------------------------------- Debridement Details Patient Name: James Moreno. Date of Service: 02/19/2018 1:00 PM Medical Record Number: [id number] Patient Account Number: [id number] Date of Birth/Sex: 1965/04/18 (52 y.o. M) Treating RN: Cornell Barman Primary Care Provider: Lelon Huh Other Clinician: Referring Provider: Lelon Huh Treating Provider/Extender: Beather Arbour Weeks in Treatment: 1 Debridement Performed for Wound #2 Right Calcaneus Assessment: Performed By: Physician Beather Arbour, FNP Debridement Type: Debridement Severity of Tissue Pre Fat layer exposed Debridement: Level of Consciousness (Pre- Awake and Alert procedure): Pre-procedure Verification/Time Yes - 13:39 Out Taken: Start Time: 13:39 Pain Control: Lidocaine Total Area Debrided (L x W): 0.8 (cm) x 0.7 (cm) = 0.56 (cm) Tissue and other material Viable, Non-Viable, Slough, Subcutaneous, Slough debrided: Level: Skin/Subcutaneous Tissue Debridement Description: Excisional Instrument: Curette Bleeding: Minimum Hemostasis Achieved: Pressure End Time: 13:40 Response to Treatment: Procedure was  tolerated well Level of Consciousness Awake and Alert (Post-procedure): Post Debridement Measurements of Total Wound Length: (cm) 0.8 Width: (cm) 0.7 Depth: (cm) 0.2 Volume: (cm) 0.088 Character of Wound/Ulcer Post Debridement: Stable Severity of Tissue Post Debridement: Limited to breakdown of skin Post Procedure Diagnosis Same as Pre-procedure Electronic Signature(s) Signed: 02/19/2018 4:25:08 PM By: Gretta Cool, BSN, RN, CWS, Kim RN, BSN Signed: 02/23/2018 5:29:42 PM By: Beather Arbour FNP-C Entered By: Gretta Cool BSN, RN, CWS, Kim on 02/19/2018 13:41:14 James Moreno ([id number]) -------------------------------------------------------------------------------- HPI Details Patient Name: James Moreno, James Moreno. Date of Service: 02/19/2018 1:00 PM Medical Record Number: [id number] Patient Account Number: [id number] Date of Birth/Sex: [birth year] (52 y.o. M) Treating RN: Cornell Barman Primary Care Provider: Lelon Huh Other Clinician: Referring Provider: Lelon Huh Treating Provider/Extender: Beather Arbour Weeks in Treatment: 1 History of Present Illness HPI Description: 10/24/17-He is seen in initial evaluation for a right posterior heel wound. He states approximately 2 months ago he sustained an injury while using a pumice/callus grater on his heels. In the time since that injury he has seen [facility], [facility], Dr. Cleda Mccreedy at [facility], emerge or so, infectious disease, and urgent care. He is currently on no antibiotc therapy but has a history of being on keflex and bactrim (at time of injury) which was discontinued at presentation to [facility] ER (within a week after injury) and initiated on cipro and clindamycin. He did see infectious disease in [place] who recommended a CAT scan, he has not heard back about the CAT scan. He saw Dr. Cleda Mccreedy last week who referred him to [facility] for evaluation. he admits to not offloading, was never instructed to.  He is on chronic immunosuppression secondary to a renal transplant 2016. His diabetes is controlled with an A1c of 7.4 last week. He does admit to pain, relieved without need for medication. He is smoking, with a plan to quit smoking cigarettes by next Monday with individual goal to decrease the amount of nicotine and his vape to 0%. He has been encouraged to expedite  the smoking cessation. 10/31/17-He is seen in follow-up evaluation for right posterior heel wound. There is no deterioration, he remains pinpoint with scant amount of serous drainage. He was evaluated by [facility] yesterday (RIGHT: ABI 1.37, TBI 0.48; LEFT: ABI 1.06, TBI 0.3) with plans for angioplasty next week. We will continue with same treatment plan and he will follow up in two weeks. He was strongly encouraged to have complete smoking cessation prior to or immediately after angioplasty to increase success. READMISSION 02/12/18 This is a 52 year old man who is a type II diabetic. He was here for 2 visits in the summer at which time he had an area on the right posterior tip of his heel. He states he was removing callus with some form of instrument caused this wound. He was also followed by Dr. Sharlotte Alamo podiatry at [facility]. He states this eventually healed over with Hydrofera Blue. The patient saw vascular surgery at [facility] on 9/16 at which time the wound was healed. He states 2 or 3 weeks ago he was removing callus from the side of his heel using topical antibiotics and he opened a new wound in this area. He states that this is very painful in fact he states the other heel wound was also painful and the pain was not completely relieved when this closed over. He has known PAD apparently saw [facility] in August and they were planning for an angioplasty although the patient went for a second opinion at [facility] by Dr. Tamera Punt long who did not feel he needed an intervention at that time. Plans  were being made to follow this with follow-up arterial studies. He has not had any more recent arterial studies then were quoted in his note of 10/31/17. At that point he had noncompressible vessels on the right with an ABI of 1.37 and a TBI of 0.48. He has been washing this wound with soap and water and covering with a Band-Aid Past medical history includes hypertension, type 2 diabetes with PAD, stage III chronic renal failure status post kidney transplant. 02/19/2018 patient seen today for follow-up of right ankle wound. He is a type II diabetic. He is followed by the vascular Center at [facility]. Recent up x-ray obtained which was negative for any abnormalities. He is currently being treated with doxycycline status post a wound culture and support of sensitivity. This antibiotic was okay by his nephrology doctor as he is a kidney transplant in 2016. The wound does have surrounding erythema with mild edema and adherent slough at the wound bed of the site. He reports a lot of sensitivity to touch of the wound. No recent fever or chills. Electronic Signature(s) Signed: 02/23/2018 5:29:42 PM By: Beather Arbour FNP-C James Moreno, James Moreno ([id number]) Entered By: Beather Arbour on 02/19/2018 13:53:57 James Moreno ([id number]) -------------------------------------------------------------------------------- Physical Exam Details Patient Name: James Moreno, James Moreno. Date of Service: 02/19/2018 1:00 PM Medical Record Number: [id number] Patient Account Number: [id number] Date of Birth/Sex: 1965/10/31 (52 y.o. M) Treating RN: Cornell Barman Primary Care Provider: Lelon Huh Other Clinician: Referring Provider: Lelon Huh Treating Provider/Extender: Beather Arbour Weeks in Treatment: 1 Notes /27/2019: Wound exam: Mr. Souder has a small open wound to his right lateral heel. Noted to have some adherent slough of the wound.. He does have a significant amount of tenderness of the wound and the  surrounding areas. Does not appeared to have cellulitis. Was able to debride the wound with a #3 curette. He tolerated the procedure  well with minimal pain and bleeding. Homeostasis was obtained by applying pressure to wound Electronic Signature(s) Signed: 02/23/2018 5:29:42 PM By: Beather Arbour FNP-C Entered By: Beather Arbour on 02/19/2018 13:56:41 James Moreno ([id number]) -------------------------------------------------------------------------------- Physician Orders Details Patient Name: James Moreno. Date of Service: 02/19/2018 1:00 PM Medical Record Number: [id number] Patient Account Number: [id number] Date of Birth/Sex: 12/11/1965 (52 y.o. M) Treating RN: Cornell Barman Primary Care Provider: Lelon Huh Other Clinician: Referring Provider: Lelon Huh Treating Provider/Extender: Beather Arbour Weeks in Treatment: 1 Verbal / Phone Orders: No Diagnosis Coding Wound Cleansing Wound #2 Right Calcaneus o Clean wound with Normal Saline. Anesthetic (add to Medication List) Wound #2 Right Calcaneus o Topical Lidocaine 4% cream applied to wound bed prior to debridement (In Clinic Only). o Benzocaine Topical Anesthetic Spray applied to wound bed prior to debridement (In Clinic Only). Primary Wound Dressing Wound #2 Right Calcaneus o Santyl Ointment Secondary Dressing Wound #2 Right Calcaneus o Dry Gauze o Boardered Foam Dressing o Other - Heel cup Dressing Change Frequency Wound #2 Right Calcaneus o Change dressing every day. Follow-up Appointments Wound #2 Right Calcaneus o Return Appointment in 1 week. Off-Loading Wound #2 Right Calcaneus o Open toe surgical shoe to: Electronic Signature(s) Signed: 02/23/2018 5:29:42 PM By: Beather Arbour FNP-C Entered By: Beather Arbour on 02/19/2018 13:57:09 James Moreno ([id number]) -------------------------------------------------------------------------------- Problem List Details Patient  Name: LAVONTE, PALOS. Date of Service: 02/19/2018 1:00 PM Medical Record Number: [id number] Patient Account Number: [id number] Date of Birth/Sex: May 04, 1965 (52 y.o. M) Treating RN: Cornell Barman Primary Care Provider: Lelon Huh Other Clinician: Referring Provider: Lelon Huh Treating Provider/Extender: Beather Arbour Weeks in Treatment: 1 Active Problems ICD-10 Evaluated Encounter Code Description Active Date Today Diagnosis E11.621 Type 2 diabetes mellitus with foot ulcer 02/12/2018 No Yes L97.411 Non-pressure chronic ulcer of right heel and midfoot limited 02/12/2018 No Yes to breakdown of skin Inactive Problems Resolved Problems Electronic Signature(s) Signed: 02/23/2018 5:29:42 PM By: Beather Arbour FNP-C Entered By: Beather Arbour on 02/19/2018 13:46:36 James Moreno, James Moreno ([id number]) -------------------------------------------------------------------------------- Progress Note Details Patient Name: James Moreno. Date of Service: 02/19/2018 1:00 PM Medical Record Number: [id number] Patient Account Number: [id number] Date of Birth/Sex: [birth year] (52 y.o. M) Treating RN: Cornell Barman Primary Care Provider: Lelon Huh Other Clinician: Referring Provider: Lelon Huh Treating Provider/Extender: Beather Arbour Weeks in Treatment: 1 Subjective Chief Complaint Information obtained from Patient right heel wound 02/12/18; patient returns with a new wound over the right lateral heel History of Present Illness (HPI) 10/24/17-He is seen in initial evaluation for a right posterior heel wound. He states approximately 2 months ago he sustained an injury while using a pumice/callus grater on his heels. In the time since that injury he has seen [facility], [facility], Dr. Cleda Mccreedy at [facility], [facility] or so, infectious disease, and urgent care. He is currently on no antibiotc therapy but has a history of being on keflex and bactrim (at time of injury)  which was discontinued at presentation to [facility] (within a week after injury) and initiated on cipro and clindamycin. He did see infectious disease in [place] who recommended a CAT scan, he has not heard back about the CAT scan. He saw Dr. Cleda Mccreedy last week who referred him to [facility] for evaluation. he admits to not offloading, was never instructed to. He is on chronic immunosuppression secondary to a renal transplant 2016. His diabetes is controlled with an A1c of 7.4 last week. He does admit to pain, relieved  without need for medication. He is smoking, with a plan to quit smoking cigarettes by next Monday with individual goal to decrease the amount of nicotine and his vape to 0%. He has been encouraged to expedite the smoking cessation. 10/31/17-He is seen in follow-up evaluation for right posterior heel wound. There is no deterioration, he remains pinpoint with scant amount of serous drainage. He was evaluated by [facility] yesterday (RIGHT: ABI 1.37, TBI 0.48; LEFT: ABI 1.06, TBI 0.3) with plans for angioplasty next week. We will continue with same treatment plan and he will follow up in two weeks. He was strongly encouraged to have complete smoking cessation prior to or immediately after angioplasty to increase success. READMISSION 02/12/18 This is a 52 year old man who is a type II diabetic. He was here for 2 visits in the summer at which time he had an area on the right posterior tip of his heel. He states he was removing callus with some form of instrument caused this wound. He was also followed by Dr. Sharlotte Alamo podiatry at [facility]. He states this eventually healed over with Hydrofera Blue. The patient saw vascular surgery at [facility] on 9/16 at which time the wound was healed. He states 2 or 3 weeks ago he was removing callus from the side of his heel using topical antibiotics and he opened a new wound in this area. He states that this is very  painful in fact he states the other heel wound was also painful and the pain was not completely relieved when this closed over. He has known PAD apparently saw [facility] in August and they were planning for an angioplasty although the patient went for a second opinion at [facility] by Dr. Tamera Punt long who did not feel he needed an intervention at that time. Plans were being made to follow this with follow-up arterial studies. He has not had any more recent arterial studies then were quoted in his note of 10/31/17. At that point he had noncompressible vessels on the right with an ABI of 1.37 and a TBI of 0.48. He has been washing this wound with soap and water and covering with a Band-Aid Past medical history includes hypertension, type 2 diabetes with PAD, stage III chronic renal failure status post kidney transplant. 02/19/2018 patient seen today for follow-up of right ankle wound. He is a type II diabetic. He is followed by the vascular Center at [facility]. Recent up x-ray obtained which was negative for any abnormalities. He is currently being treated with doxycycline status post a wound culture and support of sensitivity. This antibiotic was okay by his nephrology doctor as he is a kidney transplant in 2016. The wound does have surrounding erythema with mild edema and adherent slough at the [place] ([id number]) wound bed of the site. He reports a lot of sensitivity to touch of the wound. No recent fever or chills. Patient History Information obtained from Patient. Family History Cancer - Father, Diabetes - Father,Paternal Grandparents, Kidney Disease, Lung Disease, No family history of Heart Disease, Hereditary Spherocytosis, Hypertension, Seizures, Stroke, Thyroid Problems, Tuberculosis. Social History Current every day smoker - 1/2 pack daily, Marital Status - Married, Alcohol Use - Rarely, Drug Use - No History, Caffeine Use - Daily. Medical  History Hospitalization/Surgery History - 06/24/2016, [place], Kidney transplant. Medical And Surgical History Notes Genitourinary kidney transplant 07/24/2014 Review of Systems (ROS) Constitutional Symptoms (General Health) The patient has no complaints or symptoms. Respiratory The patient  has no complaints or symptoms. Cardiovascular The patient has no complaints or symptoms. Integumentary (Skin) Complains or has symptoms of Wounds - right ankle. Objective Constitutional Vitals Time Taken: 1:10 PM, Height: 71 in, Weight: 238 lbs, BMI: 33.2, Temperature: 98.0 F, Pulse: 100 bpm, Respiratory Rate: 16 breaths/min, Blood Pressure: 135/73 mmHg. Integumentary (Hair, Skin) Wound #2 status is Open. Original cause of wound was Gradually Appeared. The wound is located on the Right Calcaneus. The wound measures 0.8cm length x 0.7cm width x 0.1cm depth; 0.44cm^2 area and 0.044cm^3 volume. There is Fat Layer (Subcutaneous Tissue) Exposed exposed. There is no tunneling or undermining noted. There is a medium amount of serosanguineous drainage noted. The wound margin is flat and intact. There is no granulation within the wound bed. There is a large (67-100%) amount of necrotic tissue within the wound bed including Adherent Slough. The periwound skin appearance exhibited: Scarring, Erythema. The periwound skin appearance did not exhibit: Callus, Crepitus, Excoriation, Induration, Rash, Dry/Scaly, Maceration, Atrophie Blanche, Cyanosis, Ecchymosis, Hemosiderin Staining, Mottled, Pallor, Rubor. The surrounding wound skin color is noted with erythema which is circumferential. Periwound temperature was noted as No Abnormality. The periwound has tenderness on palpation. James Moreno, James Moreno ([id number]) Assessment Active Problems ICD-10 Type 2 diabetes mellitus with foot ulcer Non-pressure chronic ulcer of right heel and midfoot limited to breakdown of skin Procedures Wound #2 Pre-procedure  diagnosis of Wound #2 is a Diabetic Wound/Ulcer of the Lower Extremity located on the Right Calcaneus .Severity of Tissue Pre Debridement is: Fat layer exposed. There was a Excisional Skin/Subcutaneous Tissue Debridement with a total area of 0.56 sq cm performed by Beather Arbour, FNP. With the following instrument(s): Curette to remove Viable and Non-Viable tissue/material. Material removed includes Subcutaneous Tissue and Slough and after achieving pain control using Lidocaine. No specimens were taken. A time out was conducted at 13:39, prior to the start of the procedure. A Minimum amount of bleeding was controlled with Pressure. The procedure was tolerated well. Post Debridement Measurements: 0.8cm length x 0.7cm width x 0.2cm depth; 0.088cm^3 volume. Character of Wound/Ulcer Post Debridement is stable. Severity of Tissue Post Debridement is: Limited to breakdown of skin. Post procedure Diagnosis Wound #2: Same as Pre-Procedure Plan Wound Cleansing: Wound #2 Right Calcaneus: Clean wound with Normal Saline. Anesthetic (add to Medication List): Wound #2 Right Calcaneus: Topical Lidocaine 4% cream applied to wound bed prior to debridement (In Clinic Only). Benzocaine Topical Anesthetic Spray applied to wound bed prior to debridement (In Clinic Only). Primary Wound Dressing: Wound #2 Right Calcaneus: Santyl Ointment Secondary Dressing: Wound #2 Right Calcaneus: Dry Gauze Boardered Foam Dressing Other - Heel cup Dressing Change Frequency: Wound #2 Right Calcaneus: Change dressing every day. Follow-up Appointments: Wound #2 Right Calcaneus: Return Appointment in 1 week. Off-Loading: James Moreno, James Moreno ([id number]) Wound #2 Right Calcaneus: Open toe surgical shoe to: Electronic Signature(s) Signed: 02/23/2018 5:29:42 PM By: Beather Arbour FNP-C Entered By: Beather Arbour on 02/19/2018 13:57:18 James Moreno  ([id number]) -------------------------------------------------------------------------------- ROS/PFSH Details Patient Name: James Moreno. Date of Service: 02/19/2018 1:00 PM Medical Record Number: [id number] Patient Account Number: [id number] Date of Birth/Sex: 1965-06-30 (52 y.o. M) Treating RN: Cornell Barman Primary Care Provider: Lelon Huh Other Clinician: Referring Provider: Lelon Huh Treating Provider/Extender: Beather Arbour Weeks in Treatment: 1 Information Obtained From Patient Wound History Do you currently have one or more open woundso No Have you tested positive for osteomyelitis (bone infection)o No Have you had any tests for circulation on your legso No Integumentary (Skin) Complaints and  Symptoms: Positive for: Wounds - right ankle Medical History: Negative for: History of Burn; History of pressure wounds Constitutional Symptoms (General Health) Complaints and Symptoms: No Complaints or Symptoms Eyes Medical History: Negative for: Cataracts; Glaucoma; Optic Neuritis Ear/Nose/Mouth/Throat Medical History: Negative for: Chronic sinus problems/congestion; Middle ear problems Hematologic/Lymphatic Medical History: Negative for: Anemia; Hemophilia; Human Immunodeficiency Virus; Lymphedema; Sickle Cell Disease Respiratory Complaints and Symptoms: No Complaints or Symptoms Medical History: Negative for: Aspiration; Asthma; Chronic Obstructive Pulmonary Disease (COPD); Pneumothorax; Sleep Apnea; Tuberculosis Cardiovascular Complaints and Symptoms: No Complaints or Symptoms Medical History: James Moreno, James Moreno ([id number]) Positive for: Hypertension Negative for: Angina; Arrhythmia; Congestive Heart Failure; Coronary Artery Disease; Deep Vein Thrombosis; Hypotension; Myocardial Infarction; Peripheral Arterial Disease; Peripheral Venous Disease; Phlebitis; Vasculitis Gastrointestinal Medical History: Positive for: Hepatitis B Negative for: Cirrhosis ;  Colitis; Crohnos; Hepatitis A; Hepatitis C Endocrine Medical History: Positive for: Type II Diabetes - 7.4% Negative for: Type I Diabetes Time with diabetes: 30 years Treated with: Insulin Blood sugar tested every day: No Blood sugar testing results: Breakfast: 120 Genitourinary Medical History: Negative for: End Stage Renal Disease Past Medical History Notes: kidney transplant 07/24/2014 Immunological Medical History: Negative for: Lupus Erythematosus; Raynaudos; Scleroderma Musculoskeletal Medical History: Negative for: Gout; Rheumatoid Arthritis; Osteoarthritis; Osteomyelitis Neurologic Medical History: Negative for: Dementia; Neuropathy; Quadriplegia; Paraplegia; Seizure Disorder Oncologic Medical History: Negative for: Received Chemotherapy; Received Radiation Psychiatric Medical History: Negative for: Anorexia/bulimia; Confinement Anxiety Immunizations Pneumococcal Vaccine: Received Pneumococcal Vaccination: Yes Tetanus Vaccine: Last tetanus shot: 10/24/2016 James Moreno, James Moreno ([id number]) Implantable Devices Hospitalization / Surgery History Name of Hospital Purpose of Hospitalization/Surgery Date [facility] Kidney transplant 06/24/2016 Family and Social History Cancer: Yes - Father; Diabetes: Yes - Father,Paternal Grandparents; Heart Disease: No; Hereditary Spherocytosis: No; Hypertension: No; Kidney Disease: Yes; Lung Disease: Yes; Seizures: No; Stroke: No; Thyroid Problems: No; Tuberculosis: No; Current every day smoker - 1/2 pack daily; Marital Status - Married; Alcohol Use: Rarely; Drug Use: No History; Caffeine Use: Daily; Financial Concerns: No; Food, Clothing or Shelter Needs: No; Support System Lacking: No; Transportation Concerns: No; Advanced Directives: Yes (Not Provided); Patient does not want information on Advanced Directives; Do not resuscitate: No; Living Will: Yes (Not Provided); Medical Power of Attorney: Yes (Not Provided) Physician  Affirmation I have reviewed and agree with the above information. Electronic Signature(s) Signed: 02/19/2018 4:25:08 PM By: Gretta Cool, BSN, RN, CWS, Kim RN, BSN Signed: 02/23/2018 5:29:42 PM By: Beather Arbour FNP-C Entered By: Beather Arbour on 02/19/2018 13:54:22 James Moreno, MIKLES ([id number]) -------------------------------------------------------------------------------- SuperBill Details Patient Name: JERETT, ODONOHUE. Date of Service: 02/19/2018 Medical Record Number: [id number] Patient Account Number: [id number] Date of Birth/Sex: Jan 22, 1966 (52 y.o. M) Treating RN: Cornell Barman Primary Care Provider: Lelon Huh Other Clinician: Referring Provider: Lelon Huh Treating Provider/Extender: Beather Arbour Weeks in Treatment: 1 Diagnosis Coding ICD-10 Codes Code Description E11.621 Type 2 diabetes mellitus with foot ulcer L97.411 Non-pressure chronic ulcer of right heel and midfoot limited to breakdown of skin Facility Procedures CPT4 Code Description: 35573220 11042 - DEB SUBQ TISSUE 20 SQ CM/< ICD-10 Diagnosis Description L97.411 Non-pressure chronic ulcer of right heel and midfoot limited Modifier: to breakdown of Quantity: 1 skin Physician Procedures CPT4 Code Description: 2542706 23762 - WC PHYS SUBQ TISS 20 SQ CM ICD-10 Diagnosis Description L97.411 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

## 2018-02-25 ENCOUNTER — Ambulatory Visit: Payer: Medicare Other | Admitting: Physician Assistant

## 2018-02-25 DIAGNOSIS — D0439 Carcinoma in situ of skin of other parts of face: Secondary | ICD-10-CM | POA: Diagnosis not present

## 2018-02-25 DIAGNOSIS — D485 Neoplasm of uncertain behavior of skin: Secondary | ICD-10-CM | POA: Diagnosis not present

## 2018-02-26 ENCOUNTER — Ambulatory Visit: Payer: Medicare Other | Admitting: Internal Medicine

## 2018-03-04 ENCOUNTER — Other Ambulatory Visit: Payer: Self-pay

## 2018-03-04 DIAGNOSIS — R52 Pain, unspecified: Secondary | ICD-10-CM

## 2018-03-04 DIAGNOSIS — L905 Scar conditions and fibrosis of skin: Secondary | ICD-10-CM

## 2018-03-04 DIAGNOSIS — M79604 Pain in right leg: Secondary | ICD-10-CM

## 2018-03-05 ENCOUNTER — Ambulatory Visit: Payer: Medicare Other | Admitting: Internal Medicine

## 2018-03-05 ENCOUNTER — Encounter: Payer: Medicare Other | Attending: Internal Medicine | Admitting: Internal Medicine

## 2018-03-05 DIAGNOSIS — Z841 Family history of disorders of kidney and ureter: Secondary | ICD-10-CM | POA: Insufficient documentation

## 2018-03-05 DIAGNOSIS — Z940 Kidney transplant status: Secondary | ICD-10-CM | POA: Diagnosis not present

## 2018-03-05 DIAGNOSIS — N183 Chronic kidney disease, stage 3 (moderate): Secondary | ICD-10-CM | POA: Insufficient documentation

## 2018-03-05 DIAGNOSIS — L97411 Non-pressure chronic ulcer of right heel and midfoot limited to breakdown of skin: Secondary | ICD-10-CM | POA: Diagnosis not present

## 2018-03-05 DIAGNOSIS — E1122 Type 2 diabetes mellitus with diabetic chronic kidney disease: Secondary | ICD-10-CM | POA: Diagnosis not present

## 2018-03-05 DIAGNOSIS — F17210 Nicotine dependence, cigarettes, uncomplicated: Secondary | ICD-10-CM | POA: Insufficient documentation

## 2018-03-05 DIAGNOSIS — I129 Hypertensive chronic kidney disease with stage 1 through stage 4 chronic kidney disease, or unspecified chronic kidney disease: Secondary | ICD-10-CM | POA: Insufficient documentation

## 2018-03-05 DIAGNOSIS — S91301A Unspecified open wound, right foot, initial encounter: Secondary | ICD-10-CM | POA: Diagnosis not present

## 2018-03-05 DIAGNOSIS — Z836 Family history of other diseases of the respiratory system: Secondary | ICD-10-CM | POA: Diagnosis not present

## 2018-03-05 DIAGNOSIS — E11621 Type 2 diabetes mellitus with foot ulcer: Secondary | ICD-10-CM | POA: Diagnosis not present

## 2018-03-05 MED ORDER — OXYCODONE HCL 5 MG PO TABS
5.0000 mg | ORAL_TABLET | Freq: Four times a day (QID) | ORAL | 0 refills | Status: DC | PRN
Start: 2018-03-05 — End: 2018-04-01

## 2018-03-06 DIAGNOSIS — I10 Essential (primary) hypertension: Secondary | ICD-10-CM | POA: Diagnosis not present

## 2018-03-06 DIAGNOSIS — Z940 Kidney transplant status: Secondary | ICD-10-CM | POA: Diagnosis not present

## 2018-03-06 DIAGNOSIS — N186 End stage renal disease: Secondary | ICD-10-CM | POA: Diagnosis not present

## 2018-03-06 DIAGNOSIS — N2581 Secondary hyperparathyroidism of renal origin: Secondary | ICD-10-CM | POA: Diagnosis not present

## 2018-03-07 NOTE — Progress Notes (Signed)
James Moreno, James Moreno ([id number]) Visit Report for 03/05/2018 HPI Details Patient Name: James Moreno, James Moreno. Date of Service: 03/05/2018 1:45 PM Medical Record Number: [id number] Patient Account Number: [id number] Date of Birth/Sex: 1965/04/02 (51 y.o. M) Treating RN: Cornell Barman Primary Care Provider: Lelon Huh Other Clinician: Referring Provider: Lelon Huh Treating Provider/Extender: Tito Dine in Treatment: 3 History of Present Illness HPI Description: 10/24/17-He is seen in initial evaluation for a right posterior heel wound. He states approximately 2 months ago he sustained an injury while using a pumice/callus grater on his heels. In the time since that injury he has seen [facility], [facility], Dr. Cleda Mccreedy at [place] [facility], emerge or so, infectious disease, and urgent care. He is currently on no antibiotc therapy but has a history of being on keflex and bactrim (at time of injury) which was discontinued at presentation to [facility] ER (within a week after injury) and initiated on cipro and clindamycin. He did see infectious disease in [place] who recommended a CAT scan, he has not heard back about the CAT scan. He saw Dr. Cleda Mccreedy last week who referred him to [facility] for evaluation. he admits to not offloading, was never instructed to. He is on chronic immunosuppression secondary to a renal transplant 2016. His diabetes is controlled with an A1c of 7.4 last week. He does admit to pain, relieved without need for medication. He is smoking, with a plan to quit smoking cigarettes by next Monday with individual goal to decrease the amount of nicotine and his vape to 0%. He has been encouraged to expedite the smoking cessation. 10/31/17-He is seen in follow-up evaluation for right posterior heel wound. There is no deterioration, he remains pinpoint with scant amount of serous drainage. He was evaluated by [facility] yesterday (RIGHT:  ABI 1.37, TBI 0.48; LEFT: ABI 1.06, TBI 0.3) with plans for angioplasty next week. We will continue with same treatment plan and he will follow up in two weeks. He was strongly encouraged to have complete smoking cessation prior to or immediately after angioplasty to increase success. READMISSION 02/12/18 This is a 52 year old man who is a type II diabetic. He was here for 2 visits in the summer at which time he had an area on the right posterior tip of his heel. He states he was removing callus with some form of instrument caused this wound. He was also followed by Dr. Sharlotte Alamo podiatry at [facility]. He states this eventually healed over with Hydrofera Blue. The patient saw vascular surgery at [facility] on 9/16 at which time the wound was healed. He states 2 or 3 weeks ago he was removing callus from the side of his heel using topical antibiotics and he opened a new wound in this area. He states that this is very painful in fact he states the other heel wound was also painful and the pain was not completely relieved when this closed over. He has known PAD apparently saw [facility] in August and they were planning for an angioplasty although the patient went for a second opinion at [facility] by Dr. Tamera Punt long who did not feel he needed an intervention at that time. Plans were being made to follow this with follow-up arterial studies. He has not had any more recent arterial studies then were quoted in his note of 10/31/17. At that point he had noncompressible vessels on the right with an ABI of 1.37 and a TBI of 0.48. He  has been washing this wound with soap and water and covering with a Band-Aid Past medical history includes hypertension, type 2 diabetes with PAD, stage III chronic renal failure status post kidney transplant. 02/19/2018 patient seen today for follow-up of right ankle wound. He is a type II diabetic. He is followed by the vascular Center at [facility]. Recent  up x-ray obtained which was negative for any abnormalities. He is currently being treated with doxycycline status post a wound culture and support of sensitivity. This antibiotic was okay by his nephrology doctor as he is a kidney transplant in 2016. The wound does have surrounding erythema with mild edema and adherent slough at the wound bed of the site. He reports a lot of sensitivity to touch of the wound. No recent fever or chills. 03/05/2018 patient original wound culture revealed coag negative staph nevertheless this was treated with doxycycline with James Moreno, James R. ([id number]) improvement. The patient states the pain is better he has been using Santyl. He came in today without his heel offloading sandal states he was never given a sandal. Electronic Signature(s) Signed: 03/05/2018 4:51:45 PM By: Linton Ham MD Entered By: Linton Ham on 03/05/2018 15:53:15 James Moreno ([id number]) -------------------------------------------------------------------------------- Physical Exam Details Patient Name: James Moreno, James Moreno. Date of Service: 03/05/2018 1:45 PM Medical Record Number: [id number] Patient Account Number: [id number] Date of Birth/Sex: 09-20-1965 (51 y.o. M) Treating RN: Cornell Barman Primary Care Provider: Lelon Huh Other Clinician: Referring Provider: Lelon Huh Treating Provider/Extender: Ricard Dillon Weeks in Treatment: 3 Constitutional Sitting or standing Blood Pressure is within target range for patient.. Pulse regular and within target range for patient.Marland Kitchen Respirations regular, non-labored and within target range.. Temperature is normal and within the target range for the patient.Marland Kitchen appears in no distress. Eyes Conjunctivae clear. No discharge. Respiratory Respiratory effort is easy and symmetric bilaterally. Rate is normal at rest and on room air.. Cardiovascular Pedal pulses absent on the right. Integumentary (Hair, Skin) No erythema surrounding  the wound which is quite a bit different from last time also no tenderness. Psychiatric No evidence of depression, anxiety, or agitation. Calm, cooperative, and communicative. Appropriate interactions and affect.. Notes Wound exam; small open wound on the right lateral heel. This is not a weightbearing surface. Minasian I could not be certain about the viability of the surface he had. This may be epithelializing it almost look like he had a small fissure in the middle of it however. No debridement was done. The surrounding erythema that he had when I saw this the last time a few weeks ago has resolved. Electronic Signature(s) Signed: 03/05/2018 4:51:45 PM By: Linton Ham MD Entered By: Linton Ham on 03/05/2018 15:55:19 James Moreno ([id number]) -------------------------------------------------------------------------------- Physician Orders Details Patient Name: James Moreno, James Moreno. Date of Service: 03/05/2018 1:45 PM Medical Record Number: [id number] Patient Account Number: [id number] Date of Birth/Sex: [birth year] (51 y.o. M) Treating RN: Cornell Barman Primary Care Provider: Lelon Huh Other Clinician: Referring Provider: Lelon Huh Treating Provider/Extender: Tito Dine in Treatment: 3 Verbal / Phone Orders: No Diagnosis Coding Wound Cleansing Wound #2 Right Calcaneus o Clean wound with Normal Saline. Anesthetic (add to Medication List) Wound #2 Right Calcaneus o Topical Lidocaine 4% cream applied to wound bed prior to debridement (In Clinic Only). Primary Wound Dressing Wound #2 Right Calcaneus o Silver Alginate Secondary Dressing Wound #2 Right Calcaneus o Boardered Foam Dressing Dressing Change Frequency Wound #2 Right Calcaneus o Change dressing every day. Follow-up Appointments Wound #2 Right Calcaneus o Return  Appointment in 1 week. Off-Loading Wound #2 Right Calcaneus o Open toe surgical shoe to: - right Electronic  Signature(s) Signed: 03/05/2018 4:51:45 PM By: Linton Ham MD Signed: 03/05/2018 5:09:03 PM By: Gretta Cool, BSN, RN, CWS, Kim RN, BSN Entered By: Gretta Cool, BSN, RN, CWS, Kim on 03/05/2018 14:28:05 James Moreno, James Moreno ([id number]) -------------------------------------------------------------------------------- Problem List Details Patient Name: James Moreno, James Moreno. Date of Service: 03/05/2018 1:45 PM Medical Record Number: [id number] Patient Account Number: [id number] Date of Birth/Sex: Feb 27, 1966 (51 y.o. 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 Date of Service: 03/05/2018 1:45 PM Medical Record Number: [id number] Patient Account Number: [id number] Date of Birth/Sex: [birth year] (51 y.o. M) Treating RN: Cornell Barman Primary Care Provider: Lelon Huh Other Clinician: Referring Provider: Lelon Huh Treating Provider/Extender: Tito Dine in Treatment: 3 Subjective History of Present Illness (HPI) 10/24/17-He is seen in initial evaluation for a right posterior heel wound. He states approximately 2 months ago he sustained an injury while using a pumice/callus grater on his heels. In the time since that injury he has seen [facility],  [facility] emergency room, Dr. Cleda Mccreedy at [place] [facility], [facility], infectious disease, and urgent care. He is currently on no antibiotc therapy but has a history of being on keflex and bactrim (at time of injury) which was discontinued at presentation to [facility] ER (within a week after injury) and initiated on cipro and clindamycin. He did see infectious disease in [place] who recommended a CAT scan, he has not heard back about the CAT scan. He saw Dr. Cleda Mccreedy last week who referred him to [facility] for evaluation. he admits to not offloading, was never instructed to. He is on chronic immunosuppression secondary to a renal transplant 2016. His diabetes is controlled with an A1c of 7.4 last week. He does admit to pain, relieved without need for medication. He is smoking, with a plan to quit smoking cigarettes by next Monday with individual goal to decrease the amount of nicotine and his vape to 0%. He has been encouraged to expedite the smoking cessation. 10/31/17-He is seen in follow-up evaluation for right posterior heel wound. There is no deterioration, he remains pinpoint with scant amount of serous drainage. He was evaluated by [facility] yesterday (RIGHT: ABI 1.37, TBI 0.48; LEFT: ABI 1.06, TBI 0.3) with plans for angioplasty next week. We will continue with same treatment plan and he will follow up in two weeks. He was strongly encouraged to have complete smoking cessation prior to or immediately after angioplasty to increase success. READMISSION 02/12/18 This is a 52 year old man who is a type II diabetic. He was here for 2 visits in the summer at which time he had an area on the right posterior tip of his heel. He states he was removing callus with some form of instrument caused this wound. He was also followed by Dr. Sharlotte Alamo podiatry at [facility]. He states this eventually healed over with Hydrofera Blue. The patient  saw vascular surgery at [facility] on  9/16 at which time the wound was healed. He states 2 or 3 weeks ago he was removing callus from the side of his heel using topical antibiotics and he opened a new wound in this area. He states that this is very painful in fact he states the other heel wound was also painful and the pain was not completely relieved when this closed over. He has known PAD apparently saw [facility] in August and they were planning for an angioplasty although the patient went for a second opinion at [facility] by Dr. Tamera Punt long who did not feel he needed an intervention at that time. Plans were being made to follow this with follow-up arterial studies. He has not had any more recent arterial studies then were quoted in his note of 10/31/17. At that point he had noncompressible vessels on the right with an ABI of 1.37 and a TBI of 0.48. He has been washing this wound with soap and water and covering with a Band-Aid Past medical history includes hypertension, type 2 diabetes with PAD, stage III chronic renal failure status post kidney transplant. 02/19/2018 patient seen today for follow-up of right ankle wound. He is a type II diabetic. He is followed by the vascular Center at [facility]. Recent up x-ray obtained which was negative for any abnormalities. He is currently being treated with doxycycline status post a wound culture and support of sensitivity. This antibiotic was okay by his nephrology doctor as he is a kidney transplant in 2016. The wound does have surrounding erythema with mild edema and adherent slough at the wound bed of the site. He reports a lot of sensitivity to touch of the wound. No recent fever or chills. 03/05/2018 patient original wound culture revealed coag negative staph nevertheless this was treated with doxycycline with improvement. The patient states the pain is better he has been using Santyl. He came in today without his heel offloading sandal states he was never given a  sandal. Fedora, James R. ([id number]) Objective Constitutional Sitting or standing Blood Pressure is within target range for patient.. Pulse regular and within target range for patient.Marland Kitchen Respirations regular, non-labored and within target range.. Temperature is normal and within the target range for the patient.Marland Kitchen appears in no distress. Vitals Time Taken: 2:00 PM, Height: 71 in, Weight: 238 lbs, BMI: 33.2, Temperature: 97.9 F, Pulse: 89 bpm, Respiratory Rate: 18 breaths/min, Blood Pressure: 140/75 mmHg. Eyes Conjunctivae clear. No discharge. Respiratory Respiratory effort is easy and symmetric bilaterally. Rate is normal at rest and on room air.. Cardiovascular Pedal pulses absent on the right. Psychiatric No evidence of depression, anxiety, or agitation. Calm, cooperative, and communicative. Appropriate interactions and affect.. General Notes: Wound exam; small open wound on the right lateral heel. This is not a weightbearing surface. Minasian I could not be certain about the viability of the surface he had. This may be epithelializing it almost look like he had a small fissure in the middle of it however. No debridement was done. The surrounding erythema that he had when I saw this the last time a few weeks ago has resolved. Integumentary (Hair, Skin) No erythema surrounding the wound which is quite a bit different from last time also no tenderness. Wound #2 status is Open. Original cause of wound was Gradually Appeared. The wound is located on the Right Calcaneus. The wound measures 0.6cm length x 0.6cm width x 0.1cm depth; 0.283cm^2 area and 0.028cm^3 volume. There is Fat  Layer (Subcutaneous Tissue) Exposed exposed. There is no tunneling or undermining noted. There is a medium amount of serosanguineous drainage noted. The wound margin is flat and intact. There is small (1-33%) pale granulation within the wound bed. There is a large (67-100%) amount of necrotic tissue within the wound  bed including Adherent Slough. The periwound skin appearance exhibited: Scarring. The periwound skin appearance did not exhibit: Callus, Crepitus, Excoriation, Induration, Rash, Dry/Scaly, Maceration, Atrophie Blanche, Cyanosis, Ecchymosis, Hemosiderin Staining, Mottled, Pallor, Rubor, Erythema. Periwound temperature was noted as No Abnormality. The periwound has tenderness on palpation. Assessment Active Problems ICD-10 Type 2 diabetes mellitus with foot ulcer MURLIN, SCHRIEBER R. ([id number]) Non-pressure chronic ulcer of right heel and midfoot limited to breakdown of skin Plan Wound Cleansing: Wound #2 Right Calcaneus: Clean wound with Normal Saline. Anesthetic (add to Medication List): Wound #2 Right Calcaneus: Topical Lidocaine 4% cream applied to wound bed prior to debridement (In Clinic Only). Primary Wound Dressing: Wound #2 Right Calcaneus: Silver Alginate Secondary Dressing: Wound #2 Right Calcaneus: Boardered Foam Dressing Dressing Change Frequency: Wound #2 Right Calcaneus: Change dressing every day. Follow-up Appointments: Wound #2 Right Calcaneus: Return Appointment in 1 week. Off-Loading: Wound #2 Right Calcaneus: Open toe surgical shoe to: - right 1. X-ray I did of the heel was negative. Culture on his initial presentation grew coag negative staph he was treated with doxycycline and things seem to have cleared up. He is asking me for more antibiotics today but I see no evidence of infection the erythema that he had in the tenderness around the wound appear to be resolved. 2. At this point I think Santyl is done is much to this wound as possible. I put silver alginate on it 3. This is 1 of those occasions that I was not certain about the viability of the wound bed. Even with a #3 curette that did not sort out the issue. This may be epithelialized with some fissuring in the middle. I am hopeful that is the case and I am hopeful the alginate will help close this. 4.  The patient does have PAD. At the time he saw the vascular surgeon at [facility] he did not have an open wound, his wound on the tip of the heel had closed over. I suspect at some point this man is going to need an angiogram. 5. We gave him another healing sandal. I asked him to wear this to offload the wound area Electronic Signature(s) Signed: 03/05/2018 4:51:45 PM By: Linton Ham MD Entered By: Linton Ham on 03/05/2018 15:57:34 James Moreno ([id number]) -------------------------------------------------------------------------------- [place] Details Patient Name: James Moreno. Date of Service: 03/05/2018 Medical Record Number: [id number] Patient Account Number: [id number] Date of Birth/Sex: [birth year] (52 y.o. M) Treating RN: Cornell Barman Primary Care Provider: Lelon Huh Other Clinician: Referring Provider: Lelon Huh Treating Provider/Extender: Tito Dine in Treatment: 3 Diagnosis Coding ICD-10 Codes Code Description E11.621 Type 2 diabetes mellitus with foot ulcer L97.411 Non-pressure chronic ulcer of right heel and midfoot limited to breakdown of skin Facility Procedures CPT4 Code: [id number] Description: 99213 - WOUND CARE VISIT-LEV 3 EST PT Modifier: Quantity: 1 Physician Procedures CPT4 Code Description: [id number] 25852 - WC PHYS LEVEL 3 - EST PT ICD-10 Diagnosis Description E11.621 Type 2 diabetes mellitus with foot ulcer L97.411 Non-pressure chronic ulcer of right heel and midfoot limited Modifier: to breakdown of Quantity: 1 skin Electronic Signature(s) Signed: 03/05/2018 4:51:45 PM By: Linton Ham MD Entered By: Linton Ham on 03/05/2018 15:57:53

## 2018-03-07 NOTE — Progress Notes (Signed)
ANASTASIO, WOGAN ([id number]) Visit Report for 03/05/2018 Arrival Information Details Patient Name: James Moreno, James Moreno. Date of Service: 03/05/2018 1:45 PM Medical Record Number: [id number] Patient Account Number: [id number] Date of Birth/Sex: 01-Sep-1965 (52 y.o. M) Treating RN: Montey Hora Primary Care Khylin Gutridge: Lelon Huh Other Clinician: Referring Generoso Cropper: Lelon Huh Treating Jc Veron/Extender: Tito Dine in Treatment: 3 Visit Information History Since Last Visit Added or deleted any medications: No Patient Arrived: Ambulatory Any new allergies or adverse reactions: No Arrival Time: 13:58 Had a fall or experienced change in No Accompanied By: self activities of daily living that may affect Transfer Assistance: None risk of falls: Patient Identification Verified: Yes Signs or symptoms of abuse/neglect since last visito No Secondary Verification Process Yes Hospitalized since last visit: No Completed: Implantable device outside of the clinic excluding No Patient Has Alerts: Yes cellular tissue based products placed in the center Patient Alerts: ABI 10/31/17 L 1.06 R since last visit: 1.37 Has Dressing in Place as Prescribed: Yes TBI L .3 R .48 Pain Present Now: Yes Electronic Signature(s) Signed: 03/05/2018 4:14:25 PM By: Montey Hora Entered By: Montey Hora on 03/05/2018 13:58:44 James Moreno ([id number]) -------------------------------------------------------------------------------- Clinic Level of Care Assessment Details Patient Name: James Moreno. Date of Service: 03/05/2018 1:45 PM Medical Record Number: [id number] Patient Account Number: [id number] Date of Birth/Sex: 1966-02-12 (52 y.o. M) Treating RN: Cornell Barman Primary Care Deacon Gadbois: Lelon Huh Other Clinician: Referring Echo Propp: Lelon Huh Treating Wilbur [place]/Extender: Tito Dine in Treatment: 3 Clinic Level of Care Assessment Items TOOL 4 Quantity  Score []  - Use when only an EandM is performed on FOLLOW-UP visit 0 ASSESSMENTS - Nursing Assessment / Reassessment []  - Reassessment of Co-morbidities (includes updates in patient status) 0 X- 1 5 Reassessment of Adherence to Treatment Plan ASSESSMENTS - Wound and Skin Assessment / Reassessment X - Simple Wound Assessment / Reassessment - one wound 1 5 []  - 0 Complex Wound Assessment / Reassessment - multiple wounds []  - 0 Dermatologic / Skin Assessment (not related to wound area) ASSESSMENTS - Focused Assessment []  - Circumferential Edema Measurements - multi extremities 0 []  - 0 Nutritional Assessment / Counseling / Intervention []  - 0 Lower Extremity Assessment (monofilament, tuning fork, pulses) []  - 0 Peripheral Arterial Disease Assessment (using hand held doppler) ASSESSMENTS - Ostomy and/or Continence Assessment and Care []  - Incontinence Assessment and Management 0 []  - 0 Ostomy Care Assessment and Management (repouching, etc.) PROCESS - Coordination of Care X - Simple Patient / Family Education for ongoing care 1 15 []  - 0 Complex (extensive) Patient / Family Education for ongoing care X- 1 10 Staff obtains Programmer, systems, Records, Test Results / Process Orders []  - 0 Staff telephones HHA, Nursing Homes / Clarify orders / etc []  - 0 Routine Transfer to another Facility (non-emergent condition) []  - 0 Routine Hospital Admission (non-emergent condition) []  - 0 New Admissions / Biomedical engineer / Ordering NPWT, Apligraf, etc. []  - 0 Emergency Hospital Admission (emergent condition) X- 1 10 Simple Discharge Coordination VIVEK, GREALISH. ([id number]) []  - 0 Complex (extensive) Discharge Coordination PROCESS - Special Needs []  - Pediatric / Minor Patient Management 0 []  - 0 Isolation Patient Management []  - 0 Hearing / Language / Visual special needs []  - 0 Assessment of Community assistance (transportation, D/C planning, etc.) []  - 0 Additional assistance  / Altered mentation []  - 0 Support Surface(s) Assessment (bed, cushion, seat, etc.) INTERVENTIONS - Wound Cleansing / Measurement X - Simple Wound Cleansing - one wound  1 5 []  - 0 Complex Wound Cleansing - multiple wounds X- 1 5 Wound Imaging (photographs - any number of wounds) []  - 0 Wound Tracing (instead of photographs) X- 1 5 Simple Wound Measurement - one wound []  - 0 Complex Wound Measurement - multiple wounds INTERVENTIONS - Wound Dressings []  - Small Wound Dressing one or multiple wounds 0 X- 1 15 Medium Wound Dressing one or multiple wounds []  - 0 Large Wound Dressing one or multiple wounds []  - 0 Application of Medications - topical []  - 0 Application of Medications - injection INTERVENTIONS - Miscellaneous []  - External ear exam 0 []  - 0 Specimen Collection (cultures, biopsies, blood, body fluids, etc.) []  - 0 Specimen(s) / Culture(s) sent or taken to Lab for analysis []  - 0 Patient Transfer (multiple staff / Civil Service fast streamer / Similar devices) []  - 0 Simple Staple / Suture removal (25 or less) []  - 0 Complex Staple / Suture removal (26 or more) []  - 0 Hypo / Hyperglycemic Management (close monitor of Blood Glucose) []  - 0 Ankle / Brachial Index (ABI) - do not check if billed separately X- 1 5 Vital Signs JIE, STICKELS. ([id number]) Has the patient been seen at the hospital within the last three years: Yes Total Score: 80 Level Of Care: New/Established - Level 3 Electronic Signature(s) Signed: 03/05/2018 5:09:03 PM By: Gretta Cool, BSN, RN, CWS, Kim RN, BSN Entered By: Gretta Cool, BSN, RN, CWS, Kim on 03/05/2018 14:28:33 James Moreno ([id number]) -------------------------------------------------------------------------------- Encounter Discharge Information Details Patient Name: James Moreno. Date of Service: 03/05/2018 1:45 PM Medical Record Number: [id number] Patient Account Number: [id number] Date of Birth/Sex: 12/31/1965 (52 y.o. M) Treating RN: Cornell Barman Primary Care Michiah Mudry: Lelon Huh Other Clinician: Referring Janequa Kipnis: Lelon Huh Treating Soren Lazarz/Extender: Tito Dine in Treatment: 3 Encounter Discharge Information Items Discharge Condition: Stable Ambulatory Status: Ambulatory Discharge Destination: Home Transportation: Private Auto Accompanied By: self Schedule Follow-up Appointment: Yes Clinical Summary of Care: Electronic Signature(s) Signed: 03/05/2018 3:46:13 PM By: Gretta Cool, BSN, RN, CWS, Kim RN, BSN Entered By: Gretta Cool, BSN, RN, CWS, Kim on 03/05/2018 15:46:13 James Moreno ([id number]) -------------------------------------------------------------------------------- Lower Extremity Assessment Details Patient Name: KANNAN, PROIA. Date of Service: 03/05/2018 1:45 PM Medical Record Number: [id number] Patient Account Number: [id number] Date of Birth/Sex: 01-06-1966 (52 y.o. M) Treating RN: Montey Hora Primary Care Madissen Wyse: Lelon Huh Other Clinician: Referring Hoang Pettingill: Lelon Huh Treating Odis Wickey/Extender: Tito Dine in Treatment: 3 Vascular Assessment Pulses: Dorsalis Pedis Palpable: [Right:Yes] Posterior Tibial Extremity colors, hair growth, and conditions: Extremity Color: [Right:Normal] Hair Growth on Extremity: [Right:No] Temperature of Extremity: [Right:Warm] Capillary Refill: [Right:< 3 seconds] Toe Nail Assessment Left: Right: Thick: Yes Discolored: Yes Deformed: No Improper Length and Hygiene: Yes Electronic Signature(s) Signed: 03/05/2018 4:14:25 PM By: Montey Hora Entered By: Montey Hora on 03/05/2018 14:05:19 James Moreno ([id number]) -------------------------------------------------------------------------------- Multi Wound Chart Details Patient Name: James Moreno. Date of Service: 03/05/2018 1:45 PM Medical Record Number: [id number] Patient Account Number: [id number] Date of Birth/Sex: April 15, 1965 (52 y.o. M) Treating RN:  Cornell Barman Primary Care Daeshon Grammatico: Lelon Huh Other Clinician: Referring Thinh Cuccaro: Lelon Huh Treating Dellis Voght/Extender: Tito Dine in Treatment: 3 Vital Signs Height(in): 71 Pulse(bpm): 76 Weight(lbs): 238 Blood Pressure(mmHg): 140/75 Body Mass Index(BMI): 33 Temperature(F): 97.9 Respiratory Rate 18 (breaths/min): Photos: [2:No Photos] [N/A:N/A] Wound Location: [2:Right Calcaneus] [N/A:N/A] Wounding Event: [2:Gradually Appeared] [N/A:N/A] Primary Etiology: [2:Diabetic Wound/Ulcer of the Lower Extremity] [N/A:N/A] Comorbid History: [2:Hypertension, Hepatitis B, Type II Diabetes] [N/A:N/A] Date Acquired: [2:01/22/2018] [  N/A:N/A] Weeks of Treatment: [2:3] [N/A:N/A] Wound Status: [2:Open] [N/A:N/A] Measurements L x W x D [2:0.6x0.6x0.1] [N/A:N/A] (cm) Area (cm) : [2:0.283] [N/A:N/A] Volume (cm) : [2:0.028] [N/A:N/A] % Reduction in Area: [2:-2.90%] [N/A:N/A] % Reduction in Volume: [2:-3.70%] [N/A:N/A] Classification: [2:Grade 1] [N/A:N/A] Exudate Amount: [2:Medium] [N/A:N/A] Exudate Type: [2:Serosanguineous] [N/A:N/A] Exudate Color: [2:red, brown] [N/A:N/A] Wound Margin: [2:Flat and Intact] [N/A:N/A] Granulation Amount: [2:Small (1-33%)] [N/A:N/A] Granulation Quality: [2:Pale] [N/A:N/A] Necrotic Amount: [2:Large (67-100%)] [N/A:N/A] Exposed Structures: [2:Fat Layer (Subcutaneous Tissue) Exposed: Yes Fascia: No Tendon: No Muscle: No Joint: No Bone: No] [N/A:N/A] Epithelialization: [2:None] [N/A:N/A] Periwound Skin Texture: [2:Scarring: Yes Excoriation: No Induration: No Callus: No] [N/A:N/A] Crepitus: No Rash: No Periwound Skin Moisture: Maceration: No N/A N/A Dry/Scaly: No Periwound Skin Color: Atrophie Blanche: No N/A N/A Cyanosis: No Ecchymosis: No Erythema: No Hemosiderin Staining: No Mottled: No Pallor: No Rubor: No Temperature: No Abnormality N/A N/A Tenderness on Palpation: Yes N/A N/A Wound Preparation: Ulcer Cleansing: N/A  N/A Rinsed/Irrigated with Saline Topical Anesthetic Applied: Other: lidocaine 4% Treatment Notes Wound #2 (Right Calcaneus) Notes Prisma AG coverlet Electronic Signature(s) Signed: 03/05/2018 4:51:45 PM By: Linton Ham MD Entered By: Linton Ham on 03/05/2018 15:51:13 James Moreno ([id number]) -------------------------------------------------------------------------------- [place] Details Patient Name: NERI, SAMEK. Date of Service: 03/05/2018 1:45 PM Medical Record Number: [id number] Patient Account Number: [id number] Date of Birth/Sex: Feb 04, 1966 (52 y.o. M) Treating RN: Cornell Barman Primary Care Rosha Cocker: Lelon Huh Other Clinician: Referring Caileigh Canche: Lelon Huh Treating Sharlon Pfohl/Extender: Tito Dine in Treatment: 3 Active Inactive Soft Tissue Infection Nursing Diagnoses: Potential for infection: soft tissue Goals: Signs and symptoms of infection will be recognized early to allow for prompt treatment Date Initiated: 02/19/2018 Target Resolution Date: 03/26/2018 Goal Status: Active Interventions: Assess signs and symptoms of infection every visit Treatment Activities: Culture and sensitivity : 02/19/2018 Systemic antibiotics : 02/19/2018 Notes: Wound/Skin Impairment Nursing Diagnoses: Impaired tissue integrity Knowledge deficit related to smoking impact on wound healing Knowledge deficit related to ulceration/compromised skin integrity Goals: Patient/caregiver will verbalize understanding of skin care regimen Date Initiated: 02/12/2018 Target Resolution Date: 03/14/2018 Goal Status: Active Ulcer/skin breakdown will have a volume reduction of 30% by week 4 Date Initiated: 02/12/2018 Target Resolution Date: 03/14/2018 Goal Status: Active Interventions: Assess patient/caregiver ability to obtain necessary supplies Assess patient/caregiver ability to perform ulcer/skin care regimen upon admission and as  needed Notes: Electronic Signature(s) JOHNAVON, MCCLAFFERTY ([id number]) Signed: 03/05/2018 5:09:03 PM By: Gretta Cool, BSN, RN, CWS, Kim RN, BSN Entered By: Gretta Cool, BSN, RN, CWS, Kim on 03/05/2018 14:21:38 James Moreno ([id number]) -------------------------------------------------------------------------------- Pain Assessment Details Patient Name: JOSHAUA, EPPLE. Date of Service: 03/05/2018 1:45 PM Medical Record Number: [id number] Patient Account Number: [id number] Date of Birth/Sex: [birth year] (52 y.o. M) Treating RN: Montey Hora Primary Care Amaka Gluth: Lelon Huh Other Clinician: Referring Bridgette Wolden: Lelon Huh Treating Docie Abramovich/Extender: Tito Dine in Treatment: 3 Active Problems Location of Pain Severity and Description of Pain Patient Has Paino Yes Site Locations Pain Location: Pain in Ulcers With Dressing Change: Yes Duration of the Pain. Constant / Intermittento Constant Pain Management and Medication Current Pain Management: Electronic Signature(s) Signed: 03/05/2018 4:14:25 PM By: Montey Hora Entered By: Montey Hora on 03/05/2018 13:58:54 James Moreno ([id number]) -------------------------------------------------------------------------------- Patient/Caregiver Education Details Patient Name: James Moreno. Date of Service: 03/05/2018 1:45 PM Medical Record Number: [id number] Patient Account Number: [id number] Date of Birth/Gender: 12-Feb-1966 (52 y.o. M) Treating RN: Cornell Barman Primary Care Physician: Lelon Huh Other Clinician: Referring Physician: Lelon Huh Treating Physician/Extender: Dellia Nims  MICHAEL G Weeks in Treatment: 3 Education Assessment Education Provided To: Patient Education Topics Provided Wound/Skin Impairment: Handouts: Caring for Your Ulcer Methods: Demonstration, Explain/Verbal Responses: State content correctly Electronic Signature(s) Signed: 03/05/2018 5:09:03 PM By: Gretta Cool, BSN, RN, CWS, Kim RN,  BSN Entered By: Gretta Cool, BSN, RN, CWS, Kim on 03/05/2018 14:28:46 James Moreno ([id number]) -------------------------------------------------------------------------------- Wound Assessment Details Patient Name: KARDELL, VIRGIL. Date of Service: 03/05/2018 1:45 PM Medical Record Number: [id number] Patient Account Number: [id number] Date of Birth/Sex: [birth year] (52 y.o. M) Treating RN: Montey Hora Primary Care [name]: Lelon Huh Other Clinician: Referring Yaxiel Minnie: Lelon Huh Treating Shaquaya Wuellner/Extender: Tito Dine in Treatment: 3 Wound Status Wound Number: 2 Primary Etiology: Diabetic Wound/Ulcer of the Lower Extremity Wound Location: Right Calcaneus Wound Status: Open Wounding Event: Gradually Appeared Comorbid Hypertension, Hepatitis B, Type II Diabetes Date Acquired: 01/22/2018 History: Weeks Of Treatment: 3 Clustered Wound: No Photos Photo Uploaded By: Montey Hora on 03/05/2018 16:05:27 Wound Measurements Length: (cm) 0.6 Width: (cm) 0.6 Depth: (cm) 0.1 Area: (cm) 0.283 Volume: (cm) 0.028 % Reduction in Area: -2.9% % Reduction in Volume: -3.7% Epithelialization: None Tunneling: No Undermining: No Wound Description Classification: Grade 1 Wound Margin: Flat and Intact Exudate Amount: Medium Exudate Type: Serosanguineous Exudate Color: red, brown Foul Odor After Cleansing: No Slough/Fibrino Yes Wound Bed Granulation Amount: Small (1-33%) Exposed Structure Granulation Quality: Pale Fascia Exposed: No Necrotic Amount: Large (67-100%) Fat Layer (Subcutaneous Tissue) Exposed: Yes Necrotic Quality: Adherent Slough Tendon Exposed: No Muscle Exposed: No Joint Exposed: No Bone Exposed: No Periwound Skin Texture EILAM, [place] R. ([id number]) Texture Color No Abnormalities Noted: No No Abnormalities Noted: No Callus: No Atrophie Blanche: No Crepitus: No Cyanosis: No Excoriation: No Ecchymosis: No Induration: No Erythema:  No Rash: No Hemosiderin Staining: No Scarring: Yes Mottled: No Pallor: No Moisture Rubor: No No Abnormalities Noted: No Dry / Scaly: No Temperature / Pain Maceration: No Temperature: No Abnormality Tenderness on Palpation: Yes Wound Preparation Ulcer Cleansing: Rinsed/Irrigated with Saline Topical Anesthetic Applied: Other: lidocaine 4%, Treatment Notes Wound #2 (Right Calcaneus) Notes Prisma AG coverlet Electronic Signature(s) Signed: 03/05/2018 4:14:25 PM By: Montey Hora Entered By: Montey Hora on 03/05/2018 14:04:59 James Moreno ([id number]) -------------------------------------------------------------------------------- [place] Details Patient Name: James Moreno. Date of Service: 03/05/2018 1:45 PM Medical Record Number: [id number] Patient Account Number: [id number] Date of Birth/Sex: 1966/03/12 (52 y.o. M) Treating RN: Montey Hora Primary Care Kelechi Orgeron: Lelon Huh Other Clinician: Referring Augusta Hilbert: Lelon Huh Treating Brynn Reznik/Extender: Tito Dine in Treatment: 3 Vital Signs Time Taken: 14:00 Temperature (F): 97.9 Height (in): 71 Pulse (bpm): 89 Weight (lbs): 238 Respiratory Rate (breaths/min): 18 Body Mass Index (BMI): 33.2 Blood Pressure (mmHg): 140/75 Reference Range: 80 - 120 mg / dl Electronic Signature(s) Signed: 03/05/2018 4:14:25 PM By: Montey Hora Entered By: Montey Hora on 03/05/2018 14:04:31

## 2018-03-12 ENCOUNTER — Encounter: Payer: Medicare Other | Admitting: Internal Medicine

## 2018-03-12 DIAGNOSIS — E11621 Type 2 diabetes mellitus with foot ulcer: Secondary | ICD-10-CM | POA: Diagnosis not present

## 2018-03-12 DIAGNOSIS — L97412 Non-pressure chronic ulcer of right heel and midfoot with fat layer exposed: Secondary | ICD-10-CM | POA: Diagnosis not present

## 2018-03-12 DIAGNOSIS — E1122 Type 2 diabetes mellitus with diabetic chronic kidney disease: Secondary | ICD-10-CM | POA: Diagnosis not present

## 2018-03-12 DIAGNOSIS — L089 Local infection of the skin and subcutaneous tissue, unspecified: Secondary | ICD-10-CM | POA: Diagnosis not present

## 2018-03-12 DIAGNOSIS — Z940 Kidney transplant status: Secondary | ICD-10-CM | POA: Diagnosis not present

## 2018-03-12 DIAGNOSIS — L97411 Non-pressure chronic ulcer of right heel and midfoot limited to breakdown of skin: Secondary | ICD-10-CM | POA: Diagnosis not present

## 2018-03-12 DIAGNOSIS — N183 Chronic kidney disease, stage 3 (moderate): Secondary | ICD-10-CM | POA: Diagnosis not present

## 2018-03-12 DIAGNOSIS — I129 Hypertensive chronic kidney disease with stage 1 through stage 4 chronic kidney disease, or unspecified chronic kidney disease: Secondary | ICD-10-CM | POA: Diagnosis not present

## 2018-03-13 NOTE — Progress Notes (Signed)
JADIAN, KARMAN ([id number]) Visit Report for 03/12/2018 Debridement Details Patient Name: James Moreno, James Moreno. Date of Service: 03/12/2018 10:00 AM Medical Record Number: [id number] Patient Account Number: [id number] Date of Birth/Sex: 1965/05/09 (52 y.o. M) Treating RN: Cornell Barman Primary Care Provider: Lelon Huh Other Clinician: Referring Provider: Lelon Huh Treating Provider/Extender: Tito Dine in Treatment: 4 Debridement Performed for Wound #2 Right Calcaneus Assessment: Performed By: Physician Ricard Dillon, MD Debridement Type: Debridement Severity of Tissue Pre Fat layer exposed Debridement: Level of Consciousness (Pre- Awake and Alert procedure): Pre-procedure Verification/Time Yes - 10:15 Out Taken: Start Time: 10:15 Pain Control: Lidocaine Total Area Debrided (L x W): 0.5 (cm) x 0.4 (cm) = 0.2 (cm) Tissue and other material Viable, Non-Viable, Subcutaneous, Skin: Dermis , Skin: Epidermis debrided: Level: Skin/Subcutaneous Tissue Debridement Description: Excisional Instrument: Curette Bleeding: Minimum Hemostasis Achieved: Pressure End Time: 10:19 Response to Treatment: Procedure was tolerated well Level of Consciousness Awake and Alert (Post-procedure): Post Debridement Measurements of Total Wound Length: (cm) 0.5 Width: (cm) 0.4 Depth: (cm) 0.2 Volume: (cm) 0.031 Character of Wound/Ulcer Post Debridement: Stable Severity of Tissue Post Debridement: Fat layer exposed Post Procedure Diagnosis Same as Pre-procedure Electronic Signature(s) Signed: 03/12/2018 5:32:05 PM By: Gretta Cool, BSN, RN, CWS, Kim RN, BSN Signed: 03/12/2018 5:55:34 PM By: Linton Ham MD Entered By: Linton Ham on 03/12/2018 10:26:31 [name] ([id number]) -------------------------------------------------------------------------------- HPI Details Patient Name: James Moreno. Date of Service: 03/12/2018 10:00 AM Medical Record Number:  [id number] Patient Account Number: [id number] Date of Birth/Sex: 16-Jul-1965 (52 y.o. M) Treating RN: Cornell Barman Primary Care Provider: Lelon Huh Other Clinician: Referring Provider: Lelon Huh Treating Provider/Extender: Tito Dine in Treatment: 4 History of Present Illness HPI Description: 10/24/17-He is seen in initial evaluation for a right posterior heel wound. He states approximately 2 months ago he sustained an injury while using a pumice/callus grater on his heels. In the time since that injury he has seen [facility], [facility], Dr. Cleda Mccreedy at [facility], [facility], infectious disease, and urgent care. He is currently on no antibiotc therapy but has a history of being on keflex and bactrim (at time of injury) which was discontinued at presentation to [facility] ER (within a week after injury) and initiated on cipro and clindamycin. He did see infectious disease in [place] who recommended a CAT scan, he has not heard back about the CAT scan. He saw Dr. Cleda Mccreedy last week who referred him to [facility] for evaluation. he admits to not offloading, was never instructed to. He is on chronic immunosuppression secondary to a renal transplant 2016. His diabetes is controlled with an A1c of 7.4 last week. He does admit to pain, relieved without need for medication. He is smoking, with a plan to quit smoking cigarettes by next Monday with individual goal to decrease the amount of nicotine and his vape to 0%. He has been encouraged to expedite the smoking cessation. 10/31/17-He is seen in follow-up evaluation for right posterior heel wound. There is no deterioration, he remains pinpoint with scant amount of serous drainage. He was evaluated by [facility] yesterday (RIGHT: ABI 1.37, TBI 0.48; LEFT: ABI 1.06, TBI 0.3) with plans for angioplasty next week. We will continue with same treatment plan and he will follow up in two weeks. He  was strongly encouraged to have complete smoking cessation prior to or immediately after angioplasty to increase success. READMISSION 02/12/18 This is a 52 year old man who is a  type II diabetic. He was here for 2 visits in the summer at which time he had an area on the right posterior tip of his heel. He states he was removing callus with some form of instrument caused this wound. He was also followed by Dr. Sharlotte Alamo podiatry at [facility]. He states this eventually healed over with Hydrofera Blue. The patient saw vascular surgery at [facility] on 9/16 at which time the wound was healed. He states 2 or 3 weeks ago he was removing callus from the side of his heel using topical antibiotics and he opened a new wound in this area. He states that this is very painful in fact he states the other heel wound was also painful and the pain was not completely relieved when this closed over. He has known PAD apparently saw [facility] in August and they were planning for an angioplasty although the patient went for a second opinion at [facility] by Dr. Tamera Punt long who did not feel he needed an intervention at that time. Plans were being made to follow this with follow-up arterial studies. He has not had any more recent arterial studies then were quoted in his note of 10/31/17. At that point he had noncompressible vessels on the right with an ABI of 1.37 and a TBI of 0.48. He has been washing this wound with soap and water and covering with a Band-Aid Past medical history includes hypertension, type 2 diabetes with PAD, stage III chronic renal failure status post kidney transplant. 02/19/2018 patient seen today for follow-up of right ankle wound. He is a type II diabetic. He is followed by the vascular Center at [facility]. Recent up x-ray obtained which was negative for any abnormalities. He is currently being treated with doxycycline status post a wound culture and support of sensitivity.  This antibiotic was okay by his nephrology doctor as he is a kidney transplant in 2016. The wound does have surrounding erythema with mild edema and adherent slough at the wound bed of the site. He reports a lot of sensitivity to touch of the wound. No recent fever or chills. 03/05/2018 patient original wound culture revealed coag negative staph nevertheless this was treated with doxycycline with improvement. The patient states the pain is better he has been using Santyl. He came in today without his heel offloading sandal states he was never given a sandal. 03/12/2018; the patient arrives with more pain around the area on the right ankle. There is again surrounding erythema with some tenderness. As opposed to last week I was more convinced that the surface on this wound was not viable and therefore JAKEOB, TULLIS R. ([id number]) debridement. I do not see anything to culture here. In talking to the patient he probably has 40 yard claudication in both legs. He emphasizes to me today that this wound that we are treating currently occurred spontaneously i.e. not due to trauma like the last time he was here Electronic Signature(s) Signed: 03/12/2018 5:55:34 PM By: Linton Ham MD Entered By: Linton Ham on 03/12/2018 10:33:06 [name] ([id number]) -------------------------------------------------------------------------------- Physical Exam Details Patient Name: JOHNE, BUCKLE. Date of Service: 03/12/2018 10:00 AM Medical Record Number: [id number] Patient Account Number: [id number] Date of Birth/Sex: 1965/12/15 (52 y.o. M) Treating RN: Cornell Barman Primary Care Provider: Lelon Huh Other Clinician: Referring Provider: Lelon Huh Treating Provider/Extender: Tito Dine in Treatment: 4 Constitutional Patient is hypertensive.. Pulse regular and within target range for patient.Marland Kitchen Respirations regular, non-labored and within target  range.. Temperature is normal and  within the target range for the patient.. Cardiovascular Popliteal pulses not palpable. Pedal pulses absent bilaterally.Marland Kitchen Psychiatric No evidence of depression, anxiety, or agitation. Calm, cooperative, and communicative. Appropriate interactions and affect.. Notes Wound exam; small opening on the right lateral heel. This is actually smaller by her measurements. This is not a weightbearing surface. Once again there is a dusky erythema that is tender here. I am not certain this represents cellulitis but may be ongoing ischemia in this area. Using a #3 curette I remove the surface that I was increasingly convinced this week was not viable. There was minimal bleeding hemostasis with direct pressure Electronic Signature(s) Signed: 03/12/2018 5:55:34 PM By: Linton Ham MD Entered By: Linton Ham on 03/12/2018 10:35:46 [name] ([id number]) -------------------------------------------------------------------------------- Physician Orders Details Patient Name: ALEXANDRA, POSADAS. Date of Service: 03/12/2018 10:00 AM Medical Record Number: [id number] Patient Account Number: [id number] Date of Birth/Sex: 1965-06-10 (52 y.o. M) Treating RN: Cornell Barman Primary Care Provider: Lelon Huh Other Clinician: Referring Provider: Lelon Huh Treating Provider/Extender: Tito Dine in Treatment: 4 Verbal / Phone Orders: No Diagnosis Coding Wound Cleansing Wound #2 Right Calcaneus o Clean wound with Normal Saline. Anesthetic (add to Medication List) Wound #2 Right Calcaneus o Topical Lidocaine 4% cream applied to wound bed prior to debridement (In Clinic Only). Primary Wound Dressing Wound #2 Right Calcaneus o Silver Alginate Secondary Dressing Wound #2 Right Calcaneus o Boardered Foam Dressing Dressing Change Frequency Wound #2 Right Calcaneus o Change dressing every other day. Follow-up Appointments Wound #2 Right Calcaneus o Return Appointment in 1  week. Edema Control o Elevate legs to the level of the heart and pump ankles as often as possible Off-Loading Wound #2 Right Calcaneus o Open toe surgical shoe to: - right Medications-please add to medication list. Wound #2 Right Calcaneus o P.O. Antibiotics - Continue Antibiotics Consults o Vascular - Follow-up with [facility] Patient Medications Allergies: No Known Allergies FREDRICO, BEEDLE. ([id number]) Notifications Medication Indication Start End doxycycline monohydrate wound infection 03/12/2018 DOSE oral 100 mg capsule - 1 capsule oral bid for 7 days Electronic Signature(s) Signed: 03/12/2018 10:42:03 AM By: Linton Ham MD Entered By: Linton Ham on 03/12/2018 10:42:02 [name] ([id number]) -------------------------------------------------------------------------------- Problem List Details Patient Name: ASIM, GERSTEN. Date of Service: 03/12/2018 10:00 AM Medical Record Number: [id number] Patient Account Number: [id number] Date of Birth/Sex: [birth year] (52 y.o. M) Treating RN: Cornell Barman Primary Care Provider: Lelon Huh Other Clinician: Referring Provider: Lelon Huh Treating Provider/Extender: Tito Dine in Treatment: 4 Active Problems ICD-10 Evaluated Encounter Code Description Active Date Today Diagnosis E11.621 Type 2 diabetes mellitus with foot ulcer 02/12/2018 No Yes L97.411 Non-pressure chronic ulcer of right heel and midfoot limited 02/12/2018 No Yes to breakdown of skin Inactive Problems Resolved Problems Electronic Signature(s) Signed: 03/12/2018 5:55:34 PM By: Linton Ham MD Entered By: Linton Ham on 03/12/2018 10:25:14 [name] ([id number]) -------------------------------------------------------------------------------- Progress Note Details Patient Name: [name]. Date of Service: 03/12/2018 10:00 AM Medical Record Number: [id number] Patient Account Number: [id number] Date  of Birth/Sex: 1966/01/23 (52 y.o. M) Treating RN: Cornell Barman Primary Care Provider: Lelon Huh Other Clinician: Referring Provider: Lelon Huh Treating Provider/Extender: Tito Dine in Treatment: 4 Subjective History of Present Illness (HPI) 10/24/17-He is seen in initial evaluation for a right posterior heel wound. He states approximately 2 months ago he sustained an injury while using a pumice/callus grater on his heels. In the time since that injury  he has seen [facility], [facility], Dr. Cleda Mccreedy at [facility], [facility], infectious disease, and urgent care. He is currently on no antibiotc therapy but has a history of being on keflex and bactrim (at time of injury) which was discontinued at presentation to [facility] ER (within a week after injury) and initiated on cipro and clindamycin. He did see infectious disease in [place] who recommended a CAT scan, he has not heard back about the CAT scan. He saw Dr. Cleda Mccreedy last week who referred him to [facility] for evaluation. he admits to not offloading, was never instructed to. He is on chronic immunosuppression secondary to a renal transplant 2016. His diabetes is controlled with an A1c of 7.4 last week. He does admit to pain, relieved without need for medication. He is smoking, with a plan to quit smoking cigarettes by next Monday with individual goal to decrease the amount of nicotine and his vape to 0%. He has been encouraged to expedite the smoking cessation. 10/31/17-He is seen in follow-up evaluation for right posterior heel wound. There is no deterioration, he remains pinpoint with scant amount of serous drainage. He was evaluated by [facility] yesterday (RIGHT: ABI 1.37, TBI 0.48; LEFT: ABI 1.06, TBI 0.3) with plans for angioplasty next week. We will continue with same treatment plan and he will follow up in two weeks. He was strongly encouraged to have complete smoking  cessation prior to or immediately after angioplasty to increase success. READMISSION 02/12/18 This is a 52 year old man who is a type II diabetic. He was here for 2 visits in the summer at which time he had an area on the right posterior tip of his heel. He states he was removing callus with some form of instrument caused this wound. He was also followed by Dr. Sharlotte Alamo podiatry at [facility]. He states this eventually healed over with Hydrofera Blue. The patient saw vascular surgery at [facility] on 9/16 at which time the wound was healed. He states 2 or 3 weeks ago he was removing callus from the side of his heel using topical antibiotics and he opened a new wound in this area. He states that this is very painful in fact he states the other heel wound was also painful and the pain was not completely relieved when this closed over. He has known PAD apparently saw  [facility] in August and they were planning for an angioplasty although the patient went for a second opinion at [facility] by Dr. Tamera Punt long who did not feel he needed an intervention at that time. Plans were being made to follow this with follow-up arterial studies. He has not had any more recent arterial studies then were quoted in his note of 10/31/17. At that point he had noncompressible vessels on the right with an ABI of 1.37 and a TBI of 0.48. He has been washing this wound with soap and water and covering with a Band-Aid Past medical history includes hypertension, type 2 diabetes with PAD, stage III chronic renal failure status post kidney transplant. 02/19/2018 patient seen today for follow-up of right ankle wound. He is a type II diabetic. He is followed by the vascular Center at [facility]. Recent up x-ray obtained which was negative for any abnormalities. He is currently being treated with doxycycline status post a wound culture and support of sensitivity. This antibiotic was okay by his nephrology doctor  as he is a kidney transplant in 2016. The  wound does have surrounding erythema with mild edema and adherent slough at the wound bed of the site. He reports a lot of sensitivity to touch of the wound. No recent fever or chills. 03/05/2018 patient original wound culture revealed coag negative staph nevertheless this was treated with doxycycline with improvement. The patient states the pain is better he has been using Santyl. He came in today without his heel offloading sandal states he was never given a sandal. 03/12/2018; the patient arrives with more pain around the area on the right ankle. There is again surrounding erythema with MISAEL, MCGAHA R. ([id number]) some tenderness. As opposed to last week I was more convinced that the surface on this wound was not viable and therefore debridement. I do not see anything to culture here. In talking to the patient he probably has 40 yard claudication in both legs. He emphasizes to me today that this wound that we are treating currently occurred spontaneously i.e. not due to trauma like the last time he was here Objective Constitutional Patient is hypertensive.. Pulse regular and within target range for patient.Marland Kitchen Respirations regular, non-labored and within target range.. Temperature is normal and within the target range for the patient.. Vitals Time Taken: 10:02 AM, Height: 71 in, Weight: 238 lbs, BMI: 33.2, Temperature: 98.0 F, Pulse: 93 bpm, Respiratory Rate: 16 breaths/min, Blood Pressure: 170/84 mmHg. Cardiovascular Popliteal pulses not palpable. Pedal pulses absent bilaterally.Marland Kitchen Psychiatric No evidence of depression, anxiety, or agitation. Calm, cooperative, and communicative. Appropriate interactions and affect.. General Notes: Wound exam; small opening on the right lateral heel. This is actually smaller by her measurements. This is not a weightbearing surface. Once again there is a dusky erythema that is tender here. I am not certain this  represents cellulitis but may be ongoing ischemia in this area. Using a #3 curette I remove the surface that I was increasingly convinced this week was not viable. There was minimal bleeding hemostasis with direct pressure Integumentary (Hair, Skin) Wound #2 status is Open. Original cause of wound was Gradually Appeared. The wound is located on the Right Calcaneus. The wound measures 0.5cm length x 0.4cm width x 0.1cm depth; 0.157cm^2 area and 0.016cm^3 volume. There is Fat Layer (Subcutaneous Tissue) Exposed exposed. There is no tunneling or undermining noted. There is a medium amount of serosanguineous drainage noted. The wound margin is flat and intact. There is small (1-33%) pale granulation within the wound bed. There is a large (67-100%) amount of necrotic tissue within the wound bed including Adherent Slough. The periwound skin appearance exhibited: Scarring, Dry/Scaly. The periwound skin appearance did not exhibit: Callus, Crepitus, Excoriation, Induration, Rash, Maceration, Atrophie Blanche, Cyanosis, Ecchymosis, Hemosiderin Staining, Mottled, Pallor, Rubor, Erythema. Periwound temperature was noted as No Abnormality. The periwound has tenderness on palpation. Assessment Active Problems ICD-10 Type 2 diabetes mellitus with foot ulcer Non-pressure chronic ulcer of right heel and midfoot limited to breakdown of skin JONHATAN, HEARTY. ([id number]) Procedures Wound #2 Pre-procedure diagnosis of Wound #2 is a Diabetic Wound/Ulcer of the Lower Extremity located on the Right Calcaneus .Severity of Tissue Pre Debridement is: Fat layer exposed. There was a Excisional Skin/Subcutaneous Tissue Debridement with a total area of 0.2 sq cm performed by Ricard Dillon, MD. With the following instrument(s): Curette to remove Viable and Non-Viable tissue/material. Material removed includes Subcutaneous Tissue, Skin: Dermis, and Skin: Epidermis after achieving pain control using Lidocaine. No  specimens were taken. A time out was conducted at 10:15, prior to the start of the procedure. A  Minimum amount of bleeding was controlled with Pressure. The procedure was tolerated well. Post Debridement Measurements: 0.5cm length x 0.4cm width x 0.2cm depth; 0.031cm^3 volume. Character of Wound/Ulcer Post Debridement is stable. Severity of Tissue Post Debridement is: Fat layer exposed. Post procedure Diagnosis Wound #2: Same as Pre-Procedure Plan Wound Cleansing: Wound #2 Right Calcaneus: Clean wound with Normal Saline. Anesthetic (add to Medication List): Wound #2 Right Calcaneus: Topical Lidocaine 4% cream applied to wound bed prior to debridement (In Clinic Only). Primary Wound Dressing: Wound #2 Right Calcaneus: Silver Alginate Secondary Dressing: Wound #2 Right Calcaneus: Boardered Foam Dressing Dressing Change Frequency: Wound #2 Right Calcaneus: Change dressing every other day. Follow-up Appointments: Wound #2 Right Calcaneus: Return Appointment in 1 week. Edema Control: Elevate legs to the level of the heart and pump ankles as often as possible Off-Loading: Wound #2 Right Calcaneus: Open toe surgical shoe to: - right Medications-please add to medication list.: Wound #2 Right Calcaneus: P.O. Antibiotics - Continue Antibiotics Consults ordered were: Vascular - Follow-up with [facility] The following medication(s) was prescribed: doxycycline monohydrate oral 100 mg capsule 1 capsule oral bid for 7 days for wound infection starting 03/12/2018 CARLYN, MULLENBACH R. ([id number]) 1. I am continuing with silver alginate based dressings 2. He asked me for an empiric course of doxycycline which appeared to help with the pain in the area. Given the dusky erythema here I agreed to do this although I am not at all convinced that this is an infection. We may need to x-ray the heel. 3 I have asked him to make a follow-up appointment with the vascular surgery people at [facility]. This was  supposed to be 6 months which would be sometime in February but have asked him to do this earlier. As mentioned he has 40 to 45 yard claudication bilaterally and I wonder if it is time for an angiogram 4. Doxycycline 100 twice daily for 7 days Electronic Signature(s) Signed: 03/12/2018 10:42:32 AM By: Linton Ham MD Entered By: Linton Ham on 03/12/2018 10:42:32 [name] ([id number]) -------------------------------------------------------------------------------- SuperBill Details Patient Name: [name]. Date of Service: 03/12/2018 Medical Record Number: [id number] Patient Account Number: [id number] Date of Birth/Sex: Mar 22, 1966 (52 y.o. M) Treating RN: Cornell Barman Primary Care Provider: Lelon Huh Other Clinician: Referring Provider: Lelon Huh Treating Provider/Extender: Tito Dine in Treatment: 4 Diagnosis Coding ICD-10 Codes Code Description E11.621 Type 2 diabetes mellitus with foot ulcer L97.411 Non-pressure chronic ulcer of right heel and midfoot limited to breakdown of skin Facility Procedures CPT4 Code Description: [id number] - DEB SUBQ TISSUE 20 SQ CM/< ICD-10 Diagnosis Description E11.621 Type 2 diabetes mellitus with foot ulcer L97.411 Non-pressure chronic ulcer of right heel and midfoot limited Modifier: to breakdown of Quantity: 1 skin Physician Procedures CPT4 Code Description: [id number] - WC PHYS SUBQ TISS 20 SQ CM ICD-10 Diagnosis Description E11.621 Type 2 diabetes mellitus with foot ulcer L97.411 Non-pressure chronic ulcer of right heel and midfoot limited Modifier: to breakdown of Quantity: 1 skin Electronic Signature(s) Signed: 03/12/2018 5:55:34 PM By: Linton Ham MD Entered By: Linton Ham on 03/12/2018 10:43:39

## 2018-03-14 DIAGNOSIS — S91301A Unspecified open wound, right foot, initial encounter: Secondary | ICD-10-CM | POA: Diagnosis not present

## 2018-03-14 DIAGNOSIS — E1151 Type 2 diabetes mellitus with diabetic peripheral angiopathy without gangrene: Secondary | ICD-10-CM | POA: Diagnosis not present

## 2018-03-19 NOTE — Progress Notes (Signed)
JARIS, KOHLES ([id number]) Visit Report for 03/12/2018 Arrival Information Details Patient Name: James Moreno, James Moreno. Date of Service: 03/12/2018 10:00 AM Medical Record Number: [id number] Patient Account Number: [id number] Date of Birth/Sex: 1966-01-21 (52 y.o. M) Treating RN: Harold Barban Primary Care Arsenio Schnorr: Lelon Huh Other Clinician: Referring Taquan Bralley: Lelon Huh Treating Delano Frate/Extender: Tito Dine in Treatment: 4 Visit Information History Since Last Visit Added or deleted any medications: No Patient Arrived: Ambulatory Any new allergies or adverse reactions: No Arrival Time: 10:00 Had a fall or experienced change in No Accompanied By: self activities of daily living that may affect Transfer Assistance: None risk of falls: Patient Identification Verified: Yes Signs or symptoms of abuse/neglect since last visito No Secondary Verification Process Yes Hospitalized since last visit: No Completed: Has Dressing in Place as Prescribed: Yes Patient Has Alerts: Yes Pain Present Now: Yes Patient Alerts: ABI 10/31/17 L 1.06 R 1.37 TBI L .3 R .48 Electronic Signature(s) Signed: 03/18/2018 1:37:43 PM By: Harold Barban Entered By: Harold Barban on 03/12/2018 10:01:27 James Moreno ([id number]) -------------------------------------------------------------------------------- Encounter Discharge Information Details Patient Name: James Moreno. Date of Service: 03/12/2018 10:00 AM Medical Record Number: [id number] Patient Account Number: [id number] Date of Birth/Sex: [birth year] (51 y.o. M) Treating RN: Montey Hora Primary Care Kaja Jackowski: Lelon Huh Other Clinician: Referring Chris Cripps: Lelon Huh Treating Ariannah Arenson/Extender: Tito Dine in Treatment: 4 Encounter Discharge Information Items Post Procedure Vitals Discharge Condition: Stable Temperature (F): 98.0 Ambulatory Status: Ambulatory Pulse (bpm): 93 Discharge  Destination: Home Respiratory Rate (breaths/min): 18 Transportation: Private Auto Blood Pressure (mmHg): 170/84 Accompanied By: self Schedule Follow-up Appointment: Yes Clinical Summary of Care: Electronic Signature(s) Signed: 03/12/2018 1:57:54 PM By: Montey Hora Entered By: Montey Hora on 03/12/2018 13:57:53 James Moreno ([id number]) -------------------------------------------------------------------------------- Lower Extremity Assessment Details Patient Name: James Moreno. Date of Service: 03/12/2018 10:00 AM Medical Record Number: [id number] Patient Account Number: [id number] Date of Birth/Sex: 04/28/1965 (52 y.o. M) Treating RN: Harold Barban Primary Care Delron Comer: Lelon Huh Other Clinician: Referring Lachlyn Vanderstelt: Lelon Huh Treating Warrick Llera/Extender: Tito Dine in Treatment: 4 Vascular Assessment Pulses: Dorsalis Pedis Palpable: [Right:Yes] Posterior Tibial Palpable: [Right:Yes] Extremity colors, hair growth, and conditions: Hair Growth on Extremity: [Right:Yes] Temperature of Extremity: [Right:Warm] Capillary Refill: [Right:< 3 seconds] Toe Nail Assessment Left: Right: Thick: No Discolored: No Deformed: No Improper Length and Hygiene: No Electronic Signature(s) Signed: 03/18/2018 1:37:43 PM By: Harold Barban Entered By: Harold Barban on 03/12/2018 10:09:13 James Moreno ([id number]) -------------------------------------------------------------------------------- Multi Wound Chart Details Patient Name: James Moreno. Date of Service: 03/12/2018 10:00 AM Medical Record Number: [id number] Patient Account Number: [id number] Date of Birth/Sex: Aug 25, 1965 (52 y.o. M) Treating RN: Cornell Barman Primary Care Duglas Heier: Lelon Huh Other Clinician: Referring Laker Thompson: Lelon Huh Treating Tameka Hoiland/Extender: Tito Dine in Treatment: 4 Vital Signs Height(in): 71 Pulse(bpm): 93 Weight(lbs): 238 Blood  Pressure(mmHg): 170/84 Body Mass Index(BMI): 33 Temperature(F): 98.0 Respiratory Rate 16 (breaths/min): Photos: [2:No Photos] [N/A:N/A] Wound Location: [2:Right Calcaneus] [N/A:N/A] Wounding Event: [2:Gradually Appeared] [N/A:N/A] Primary Etiology: [2:Diabetic Wound/Ulcer of the Lower Extremity] [N/A:N/A] Comorbid History: [2:Hypertension, Hepatitis B, Type II Diabetes] [N/A:N/A] Date Acquired: [2:01/22/2018] [N/A:N/A] Weeks of Treatment: [2:4] [N/A:N/A] Wound Status: [2:Open] [N/A:N/A] Measurements L x W x D [2:0.5x0.4x0.1] [N/A:N/A] (cm) Area (cm) : [2:0.157] [N/A:N/A] Volume (cm) : [2:0.016] [N/A:N/A] % Reduction in Area: [2:42.90%] [N/A:N/A] % Reduction in Volume: [2:40.70%] [N/A:N/A] Classification: [2:Grade 1] [N/A:N/A] Exudate Amount: [2:Medium] [N/A:N/A] Exudate Type: [2:Serosanguineous] [N/A:N/A] Exudate Color: [2:red, brown] [N/A:N/A] Wound Margin: [2:Flat and Intact] [N/A:N/A] Granulation  Amount: [2:Small (1-33%)] [N/A:N/A] Granulation Quality: [2:Pale] [N/A:N/A] Necrotic Amount: [2:Large (67-100%)] [N/A:N/A] Exposed Structures: [2:Fat Layer (Subcutaneous Tissue) Exposed: Yes Fascia: No Tendon: No Muscle: No Joint: No Bone: No] [N/A:N/A] Epithelialization: [2:None] [N/A:N/A] Debridement: [2:Debridement - Excisional] [N/A:N/A] Pre-procedure [2:10:15] [N/A:N/A] Verification/Time Out Taken: Pain Control: [2:Lidocaine] [N/A:N/A] Tissue Debrided: [2:Subcutaneous] [N/A:N/A] Level: Skin/Subcutaneous Tissue N/A N/A Debridement Area (sq cm): 0.2 N/A N/A Instrument: Curette N/A N/A Bleeding: Minimum N/A N/A Hemostasis Achieved: Pressure N/A N/A Debridement Treatment Procedure was tolerated well N/A N/A Response: Post Debridement 0.5x0.4x0.2 N/A N/A Measurements L x W x D (cm) Post Debridement Volume: 0.031 N/A N/A (cm) Periwound Skin Texture: Scarring: Yes N/A N/A Excoriation: No Induration: No Callus: No Crepitus: No Rash: No Periwound Skin  Moisture: Dry/Scaly: Yes N/A N/A Maceration: No Periwound Skin Color: Atrophie Blanche: No N/A N/A Cyanosis: No Ecchymosis: No Erythema: No Hemosiderin Staining: No Mottled: No Pallor: No Rubor: No Temperature: No Abnormality N/A N/A Tenderness on Palpation: Yes N/A N/A Wound Preparation: Ulcer Cleansing: N/A N/A Rinsed/Irrigated with Saline Topical Anesthetic Applied: Other: lidocaine 4% Procedures Performed: Debridement N/A N/A Treatment Notes Electronic Signature(s) Signed: 03/12/2018 5:55:34 PM By: Linton Ham MD Entered By: Linton Ham on 03/12/2018 10:25:25 James Moreno ([id number]) -------------------------------------------------------------------------------- [place] Details Patient Name: James Moreno, James Moreno. Date of Service: 03/12/2018 10:00 AM Medical Record Number: [id number] Patient Account Number: [id number] Date of Birth/Sex: April 11, 1965 (52 y.o. M) Treating RN: Cornell Barman Primary Care Annia Gomm: Lelon Huh Other Clinician: Referring Carlia Bomkamp: Lelon Huh Treating Barnett Elzey/Extender: Tito Dine in Treatment: 4 Active Inactive Soft Tissue Infection Nursing Diagnoses: Potential for infection: soft tissue Goals: Signs and symptoms of infection will be recognized early to allow for prompt treatment Date Initiated: 02/19/2018 Target Resolution Date: 03/26/2018 Goal Status: Active Interventions: Assess signs and symptoms of infection every visit Treatment Activities: Culture and sensitivity : 02/19/2018 Systemic antibiotics : 02/19/2018 Notes: Wound/Skin Impairment Nursing Diagnoses: Impaired tissue integrity Knowledge deficit related to smoking impact on wound healing Knowledge deficit related to ulceration/compromised skin integrity Goals: Patient/caregiver will verbalize understanding of skin care regimen Date Initiated: 02/12/2018 Target Resolution Date: 03/14/2018 Goal Status: Active Ulcer/skin  breakdown will have a volume reduction of 30% by week 4 Date Initiated: 02/12/2018 Target Resolution Date: 03/14/2018 Goal Status: Active Interventions: Assess patient/caregiver ability to obtain necessary supplies Assess patient/caregiver ability to perform ulcer/skin care regimen upon admission and as needed Notes: Electronic Signature(s) James Moreno, James Moreno ([id number]) Signed: 03/12/2018 5:32:05 PM By: Gretta Cool, BSN, RN, CWS, Kim RN, BSN Entered By: Gretta Cool, BSN, RN, CWS, Kim on 03/12/2018 10:12:15 James Moreno ([id number]) -------------------------------------------------------------------------------- Pain Assessment Details Patient Name: James Moreno, James Moreno. Date of Service: 03/12/2018 10:00 AM Medical Record Number: [id number] Patient Account Number: [id number] Date of Birth/Sex: Mar 14, 1966 (52 y.o. M) Treating RN: Harold Barban Primary Care Mela Perham: Lelon Huh Other Clinician: Referring Jeramyah Goodpasture: Lelon Huh Treating Echo Propp/Extender: Tito Dine in Treatment: 4 Active Problems Location of Pain Severity and Description of Pain Patient Has Paino Yes Site Locations Rate the pain. Current Pain Level: 3 Pain Management and Medication Current Pain Management: Electronic Signature(s) Signed: 03/18/2018 1:37:43 PM By: Harold Barban Entered By: Harold Barban on 03/12/2018 10:02:22 James Moreno ([id number]) -------------------------------------------------------------------------------- Patient/Caregiver Education Details Patient Name: James Moreno. Date of Service: 03/12/2018 10:00 AM Medical Record Number: [id number] Patient Account Number: [id number] Date of Birth/Gender: 1965/08/31 (52 y.o. M) Treating RN: Cornell Barman Primary Care Physician: Lelon Huh Other Clinician: Referring Physician: Lelon Huh Treating Physician/Extender: Ricard Dillon Weeks in Treatment:  4 Education Assessment Education Provided To: Patient Education  Topics Provided Wound Debridement: Handouts: Wound Debridement Methods: Demonstration, Explain/Verbal Responses: State content correctly Wound/Skin Impairment: Handouts: Caring for Your Ulcer Methods: Demonstration, Explain/Verbal Responses: State content correctly Electronic Signature(s) Signed: 03/12/2018 5:32:05 PM By: Gretta Cool, BSN, RN, CWS, Kim RN, BSN Entered By: Gretta Cool, BSN, RN, CWS, Kim on 03/12/2018 10:19:14 James Moreno ([id number]) -------------------------------------------------------------------------------- Wound Assessment Details Patient Name: James Moreno, James Moreno. Date of Service: 03/12/2018 10:00 AM Medical Record Number: [id number] Patient Account Number: [id number] Date of Birth/Sex: 06-May-1965 (52 y.o. M) Treating RN: Harold Barban Primary Care Vernell Townley: Lelon Huh Other Clinician: Referring Tunis Gentle: Lelon Huh Treating Prospero Mahnke/Extender: Tito Dine in Treatment: 4 Wound Status Wound Number: 2 Primary Etiology: Diabetic Wound/Ulcer of the Lower Extremity Wound Location: Right Calcaneus Wound Status: Open Wounding Event: Gradually Appeared Comorbid Hypertension, Hepatitis B, Type II Diabetes Date Acquired: 01/22/2018 History: Weeks Of Treatment: 4 Clustered Wound: No Photos Photo Uploaded By: Harold Barban on 03/12/2018 11:56:52 Wound Measurements Length: (cm) 0.5 Width: (cm) 0.4 Depth: (cm) 0.1 Area: (cm) 0.157 Volume: (cm) 0.016 % Reduction in Area: 42.9% % Reduction in Volume: 40.7% Epithelialization: None Tunneling: No Undermining: No Wound Description Classification: Grade 1 Wound Margin: Flat and Intact Exudate Amount: Medium Exudate Type: Serosanguineous Exudate Color: red, brown Foul Odor After Cleansing: No Slough/Fibrino Yes Wound Bed Granulation Amount: Small (1-33%) Exposed Structure Granulation Quality: Pale Fascia Exposed: No Necrotic Amount: Large (67-100%) Fat Layer (Subcutaneous Tissue)  Exposed: Yes Necrotic Quality: Adherent Slough Tendon Exposed: No Muscle Exposed: No Joint Exposed: No Bone Exposed: No Periwound Skin Texture James Moreno, James R. ([id number]) Texture Color No Abnormalities Noted: No No Abnormalities Noted: No Callus: No Atrophie Blanche: No Crepitus: No Cyanosis: No Excoriation: No Ecchymosis: No Induration: No Erythema: No Rash: No Hemosiderin Staining: No Scarring: Yes Mottled: No Pallor: No Moisture Rubor: No No Abnormalities Noted: No Dry / Scaly: Yes Temperature / Pain Maceration: No Temperature: No Abnormality Tenderness on Palpation: Yes Wound Preparation Ulcer Cleansing: Rinsed/Irrigated with Saline Topical Anesthetic Applied: Other: lidocaine 4%, Treatment Notes Wound #2 (Right Calcaneus) Notes silvercel and bordered foam dressing Electronic Signature(s) Signed: 03/18/2018 1:37:43 PM By: Harold Barban Entered By: Harold Barban on 03/12/2018 10:08:31 James Moreno ([id number]) -------------------------------------------------------------------------------- [place] Details Patient Name: James Moreno. Date of Service: 03/12/2018 10:00 AM Medical Record Number: [id number] Patient Account Number: [id number] Date of Birth/Sex: [birth year] (52 y.o. M) Treating RN: Harold Barban Primary Care Ismar Yabut: Lelon Huh Other Clinician: Referring Telesa Jeancharles: Lelon Huh Treating Jonluke Cobbins/Extender: Tito Dine in Treatment: 4 Vital Signs Time Taken: 10:02 Temperature (F): 98.0 Height (in): 71 Pulse (bpm): 93 Weight (lbs): 238 Respiratory Rate (breaths/min): 16 Body Mass Index (BMI): 33.2 Blood Pressure (mmHg): 170/84 Reference Range: 80 - 120 mg / dl Electronic Signature(s) Signed: 03/18/2018 1:37:43 PM By: Harold Barban Entered By: Harold Barban on 03/12/2018 10:04:44

## 2018-03-25 ENCOUNTER — Encounter: Payer: Medicare Other | Admitting: Family Medicine

## 2018-03-25 DIAGNOSIS — Z940 Kidney transplant status: Secondary | ICD-10-CM | POA: Diagnosis not present

## 2018-03-25 DIAGNOSIS — N183 Chronic kidney disease, stage 3 (moderate): Secondary | ICD-10-CM | POA: Diagnosis not present

## 2018-03-25 DIAGNOSIS — L97411 Non-pressure chronic ulcer of right heel and midfoot limited to breakdown of skin: Secondary | ICD-10-CM | POA: Diagnosis not present

## 2018-03-25 DIAGNOSIS — I129 Hypertensive chronic kidney disease with stage 1 through stage 4 chronic kidney disease, or unspecified chronic kidney disease: Secondary | ICD-10-CM | POA: Diagnosis not present

## 2018-03-25 DIAGNOSIS — E11621 Type 2 diabetes mellitus with foot ulcer: Secondary | ICD-10-CM | POA: Diagnosis not present

## 2018-03-25 DIAGNOSIS — E1122 Type 2 diabetes mellitus with diabetic chronic kidney disease: Secondary | ICD-10-CM | POA: Diagnosis not present

## 2018-03-27 NOTE — Progress Notes (Signed)
PRINSTON, KYNARD ([id number]) Visit Report for 03/25/2018 Arrival Information Details Patient Name: James Moreno, James Moreno. Date of Service: 03/25/2018 11:30 AM Medical Record Number: [id number] Patient Account Number: [id number] Date of Birth/Sex: 1965-06-25 (53 y.o. M) Treating RN: Cornell Barman Primary Care Captain Blucher: Lelon Huh Other Clinician: Referring Latoia Eyster: Lelon Huh Treating Delois Silvester/Extender: Beather Arbour Weeks in Treatment: 5 Visit Information History Since Last Visit Added or deleted any medications: No Patient Arrived: Ambulatory Any new allergies or adverse reactions: No Arrival Time: 11:36 Had a fall or experienced change in No Accompanied By: self activities of daily living that may affect Transfer Assistance: None risk of falls: Patient Identification Verified: Yes Signs or symptoms of abuse/neglect since last visito No Secondary Verification Process Yes Hospitalized since last visit: No Completed: Implantable device outside of the clinic excluding No Patient Has Alerts: Yes cellular tissue based products placed in the center Patient Alerts: ABI 10/31/17 L 1.06 R since last visit: 1.37 Has Dressing in Place as Prescribed: Yes TBI L .3 R .48 Pain Present Now: No Electronic Signature(s) Signed: 03/25/2018 4:45:10 PM By: Gretta Cool, BSN, RN, CWS, Kim RN, BSN Entered By: Gretta Cool, BSN, RN, CWS, Kim on 03/25/2018 11:36:30 James Moreno ([id number]) -------------------------------------------------------------------------------- Encounter Discharge Information Details Patient Name: James Moreno, James Moreno. Date of Service: 03/25/2018 11:30 AM Medical Record Number: [id number] Patient Account Number: [id number] Date of Birth/Sex: 24-Jul-1965 (53 y.o. M) Treating RN: Cornell Barman Primary Care Asaph Serena: Lelon Huh Other Clinician: Referring Katye Valek: Lelon Huh Treating Salwa Bai/Extender: Oneida Arenas in Treatment: 5 Encounter Discharge Information Items  Post Procedure Vitals Discharge Condition: Stable Unable to obtain vitals Reason: Patient refused Ambulatory Status: Ambulatory Discharge Destination: Home Transportation: Private Auto Accompanied By: self Schedule Follow-up Appointment: Yes Clinical Summary of Care: Electronic Signature(s) Signed: 03/25/2018 4:45:10 PM By: Gretta Cool, BSN, RN, CWS, Kim RN, BSN Entered By: Gretta Cool, BSN, RN, CWS, Kim on 03/25/2018 12:07:19 James Moreno ([id number]) -------------------------------------------------------------------------------- Lower Extremity Assessment Details Patient Name: James Moreno, James Moreno. Date of Service: 03/25/2018 11:30 AM Medical Record Number: [id number] Patient Account Number: [id number] Date of Birth/Sex: 12-24-1965 (53 y.o. M) Treating RN: Cornell Barman Primary Care Crystalann Korf: Lelon Huh Other Clinician: Referring Ondrea Dow: Lelon Huh Treating Osten Janek/Extender: Beather Arbour Weeks in Treatment: 5 Vascular Assessment Pulses: Dorsalis Pedis Palpable: [Right:Yes] Posterior Tibial Extremity colors, hair growth, and conditions: Extremity Color: [Right:Normal] Hair Growth on Extremity: [Right:Yes] Temperature of Extremity: [Right:Warm] Capillary Refill: [Right:< 3 seconds] Toe Nail Assessment Left: Right: Thick: No Discolored: No Deformed: No Improper Length and Hygiene: No Electronic Signature(s) Signed: 03/25/2018 4:45:10 PM By: Gretta Cool, BSN, RN, CWS, Kim RN, BSN Entered By: Gretta Cool, BSN, RN, CWS, Kim on 03/25/2018 11:40:56 James Moreno ([id number]) -------------------------------------------------------------------------------- Multi Wound Chart Details Patient Name: James Moreno. Date of Service: 03/25/2018 11:30 AM Medical Record Number: [id number] Patient Account Number: [id number] Date of Birth/Sex: 02-05-1966 (53 y.o. M) Treating RN: Cornell Barman Primary Care Lorilee Cafarella: Lelon Huh Other Clinician: Referring Dafney Farler: Lelon Huh Treating  Tobias Avitabile/Extender: Beather Arbour Weeks in Treatment: 5 Photos: [N/A:N/A] Wound Location: Right Calcaneus N/A N/A Wounding Event: Gradually Appeared N/A N/A Primary Etiology: Diabetic Wound/Ulcer of the N/A N/A Lower Extremity Comorbid History: Hypertension, Hepatitis B, N/A N/A Type II Diabetes Date Acquired: 01/22/2018 N/A N/A Weeks of Treatment: 5 N/A N/A Wound Status: Open N/A N/A Measurements L x W x D 0.5x0.5x0.2 N/A N/A (cm) Area (cm) : 0.196 N/A N/A Volume (cm) : 0.039 N/A N/A % Reduction in Area: 28.70% N/A N/A % Reduction in Volume: -44.40% N/A N/A Classification: Grade  1 N/A N/A Exudate Amount: Medium N/A N/A Exudate Type: Serosanguineous N/A N/A Exudate Color: red, brown N/A N/A Wound Margin: Flat and Intact N/A N/A Granulation Amount: Small (1-33%) N/A N/A Granulation Quality: Pale N/A N/A Necrotic Amount: Large (67-100%) N/A N/A Exposed Structures: Fat Layer (Subcutaneous N/A N/A Tissue) Exposed: Yes Fascia: No Tendon: No Muscle: No Joint: No Bone: No Epithelialization: None N/A N/A Debridement: Debridement - Selective/Open N/A N/A Wound Pre-procedure 12:02 N/A N/A Verification/Time Out Taken: Pain Control: Lidocaine N/A N/A Tissue Debrided: Slough N/A N/A TERION, HEDMAN ([id number]) Level: Non-Viable Tissue N/A N/A Debridement Area (sq cm): 0.25 N/A N/A Instrument: Curette N/A N/A Bleeding: Minimum N/A N/A Hemostasis Achieved: Pressure N/A N/A Debridement Treatment Procedure was tolerated well N/A N/A Response: Post Debridement 0.5x0.5x0.2 N/A N/A Measurements L x W x D (cm) Post Debridement Volume: 0.039 N/A N/A (cm) Periwound Skin Texture: Scarring: Yes N/A N/A Excoriation: No Induration: No Callus: No Crepitus: No Rash: No Periwound Skin Moisture: Dry/Scaly: Yes N/A N/A Maceration: No Periwound Skin Color: Atrophie Blanche: No N/A N/A Cyanosis: No Ecchymosis: No Erythema: No Hemosiderin Staining: No Mottled: No Pallor:  No Rubor: No Temperature: No Abnormality N/A N/A Tenderness on Palpation: Yes N/A N/A Wound Preparation: Ulcer Cleansing: N/A N/A Rinsed/Irrigated with Saline Topical Anesthetic Applied: Other: lidocaine 4% Procedures Performed: Debridement N/A N/A Treatment Notes Wound #2 (Right Calcaneus) Notes silvercel and bordered foam dressing Electronic Signature(s) Signed: 03/27/2018 12:47:52 AM By: Beather Arbour FNP-C Entered By: Beather Arbour on 03/25/2018 13:03:07 James Moreno ([id number]) -------------------------------------------------------------------------------- [place] Details Patient Name: JOE, James Moreno. Date of Service: 03/25/2018 11:30 AM Medical Record Number: [id number] Patient Account Number: [id number] Date of Birth/Sex: 07/25/1965 (53 y.o. M) Treating RN: Cornell Barman Primary Care Calisa Luckenbaugh: Lelon Huh Other Clinician: Referring Lekita Kerekes: Lelon Huh Treating Keiara Sneeringer/Extender: Beather Arbour Weeks in Treatment: 5 Active Inactive Soft Tissue Infection Nursing Diagnoses: Potential for infection: soft tissue Goals: Signs and symptoms of infection will be recognized early to allow for prompt treatment Date Initiated: 02/19/2018 Target Resolution Date: 03/26/2018 Goal Status: Active Interventions: Assess signs and symptoms of infection every visit Treatment Activities: Culture and sensitivity : 02/19/2018 Systemic antibiotics : 02/19/2018 Notes: Wound/Skin Impairment Nursing Diagnoses: Impaired tissue integrity Knowledge deficit related to smoking impact on wound healing Knowledge deficit related to ulceration/compromised skin integrity Goals: Patient/caregiver will verbalize understanding of skin care regimen Date Initiated: 02/12/2018 Target Resolution Date: 03/14/2018 Goal Status: Active Ulcer/skin breakdown will have a volume reduction of 30% by week 4 Date Initiated: 02/12/2018 Target Resolution Date: 03/14/2018 Goal  Status: Active Interventions: Assess patient/caregiver ability to obtain necessary supplies Assess patient/caregiver ability to perform ulcer/skin care regimen upon admission and as needed Notes: Electronic Signature(s) THEODOR, MUSTIN ([id number]) Signed: 03/25/2018 4:45:10 PM By: Gretta Cool, BSN, RN, CWS, Kim RN, BSN Entered By: Gretta Cool, BSN, RN, CWS, Kim on 03/25/2018 12:00:39 James Moreno ([id number]) -------------------------------------------------------------------------------- Pain Assessment Details Patient Name: MAL, ASHER. Date of Service: 03/25/2018 11:30 AM Medical Record Number: [id number] Patient Account Number: [id number] Date of Birth/Sex: 02-May-1965 (53 y.o. M) Treating RN: Cornell Barman Primary Care Amrit Cress: Lelon Huh Other Clinician: Referring Raffaella Edison: Lelon Huh Treating Marayah Higdon/Extender: Beather Arbour Weeks in Treatment: 5 Active Problems Location of Pain Severity and Description of Pain Patient Has Paino No Site Locations With Dressing Change: No Pain Management and Medication Current Pain Management: Electronic Signature(s) Signed: 03/25/2018 4:45:10 PM By: Gretta Cool, BSN, RN, CWS, Kim RN, BSN Entered By: Gretta Cool, BSN, RN, CWS, Kim on 03/25/2018 11:36:44 Hautala, Elyn Aquas. (  [id number]) -------------------------------------------------------------------------------- Patient/Caregiver Education Details Patient Name: SAAMIR, ARMSTRONG. Date of Service: 03/25/2018 11:30 AM Medical Record Number: [id number] Patient Account Number: [id number] Date of Birth/Gender: [birth year] (53 y.o. M) Treating RN: Cornell Barman Primary Care Physician: Lelon Huh Other Clinician: Referring Physician: Lelon Huh Treating Physician/Extender: Oneida Arenas in Treatment: 5 Education Assessment Education Provided To: Patient Education Topics Provided Wound/Skin Impairment: Handouts: Caring for Your Ulcer Methods: Demonstration, Explain/Verbal Responses:  State content correctly Electronic Signature(s) Signed: 03/25/2018 4:45:10 PM By: Gretta Cool, BSN, RN, CWS, Kim RN, BSN Entered By: Gretta Cool, BSN, RN, CWS, Kim on 03/25/2018 12:06:36 James Moreno ([id number]) -------------------------------------------------------------------------------- Wound Assessment Details Patient Name: James Moreno, James Moreno. Date of Service: 03/25/2018 11:30 AM Medical Record Number: [id number] Patient Account Number: [id number] Date of Birth/Sex: 02/01/1966 (53 y.o. M) Treating RN: Cornell Barman Primary Care Breckin Zafar: Lelon Huh Other Clinician: Referring Yilia Sacca: Lelon Huh Treating Aubery Douthat/Extender: Beather Arbour Weeks in Treatment: 5 Wound Status Wound Number: 2 Primary Etiology: Diabetic Wound/Ulcer of the Lower Extremity Wound Location: Right Calcaneus Wound Status: Open Wounding Event: Gradually Appeared Comorbid Hypertension, Hepatitis B, Type II Diabetes Date Acquired: 01/22/2018 History: Weeks Of Treatment: 5 Clustered Wound: No Photos Photo Uploaded By: Gretta Cool, BSN, RN, CWS, Kim on 03/25/2018 11:55:48 Wound Measurements Length: (cm) 0.5 Width: (cm) 0.5 Depth: (cm) 0.2 Area: (cm) 0.196 Volume: (cm) 0.039 % Reduction in Area: 28.7% % Reduction in Volume: -44.4% Epithelialization: None Tunneling: No Undermining: No Wound Description Classification: Grade 1 Foul Odor Wound Margin: Flat and Intact Slough/Fi Exudate Amount: Medium Exudate Type: Serosanguineous Exudate Color: red, brown After Cleansing: No brino Yes Wound Bed Granulation Amount: Small (1-33%) Exposed Structure Granulation Quality: Pale Fascia Exposed: No Necrotic Amount: Large (67-100%) Fat Layer (Subcutaneous Tissue) Exposed: Yes Necrotic Quality: Adherent Slough Tendon Exposed: No Muscle Exposed: No Joint Exposed: No Bone Exposed: No Periwound Skin Texture James Moreno, James R. ([id number]) Texture Color No Abnormalities Noted: No No Abnormalities Noted:  No Callus: No Atrophie Blanche: No Crepitus: No Cyanosis: No Excoriation: No Ecchymosis: No Induration: No Erythema: No Rash: No Hemosiderin Staining: No Scarring: Yes Mottled: No Pallor: No Moisture Rubor: No No Abnormalities Noted: No Dry / Scaly: Yes Temperature / Pain Maceration: No Temperature: No Abnormality Tenderness on Palpation: Yes Wound Preparation Ulcer Cleansing: Rinsed/Irrigated with Saline Topical Anesthetic Applied: Other: lidocaine 4%, Treatment Notes Wound #2 (Right Calcaneus) Notes silvercel and bordered foam dressing Electronic Signature(s) Signed: 03/25/2018 4:45:10 PM By: Gretta Cool, BSN, RN, CWS, Kim RN, BSN Entered By: Gretta Cool, BSN, RN, CWS, Kim on 03/25/2018 11:40:15 James Moreno ([id number]) -------------------------------------------------------------------------------- James Moreno Details Patient Name: James Moreno. Date of Service: 03/25/2018 11:30 AM Medical Record Number: [id number] Patient Account Number: [id number] Date of Birth/Sex: 07-May-1965 (53 y.o. M) Treating RN: Cornell Barman Primary Care Bayley Hurn: Lelon Huh Other Clinician: Referring Sabrina Keough: Lelon Huh Treating Treysen Sudbeck/Extender: Beather Arbour Weeks in Treatment: 5 Vital Signs Time Taken: 11:36 Reference Range: 80 - 120 mg / dl Height (in): 71 Weight (lbs): 238 Body Mass Index (BMI): 33.2 Notes Patient refused vital being taken. States he has not had his medication today and does NOT want to know what his BP is. Electronic Signature(s) Signed: 03/25/2018 4:45:10 PM By: Gretta Cool, BSN, RN, CWS, Kim RN, BSN Entered By: Gretta Cool, BSN, RN, CWS, Kim on 03/25/2018 11:37:44

## 2018-03-27 NOTE — Progress Notes (Signed)
THIERNO, HUN ([id number]) Visit Report for 03/25/2018 Chief Complaint Document Details Patient Name: James Moreno, James Moreno. Date of Service: 03/25/2018 11:30 AM Medical Record Number: [id number] Patient Account Number: [id number] Date of Birth/Sex: [birth year] (53 y.o. M) Treating RN: Cornell Barman Primary Care Provider: Lelon Huh Other Clinician: Referring Provider: Lelon Huh Treating Provider/Extender: Beather Arbour Weeks in Treatment: 5 Information Obtained from: Patient Chief Complaint right heel wound 02/12/18; patient returns with a new wound over the right lateral heel Electronic Signature(s) Signed: 03/27/2018 12:47:52 AM By: Beather Arbour FNP-C Entered By: Beather Arbour on 03/25/2018 13:05:37 James Moreno ([id number]) -------------------------------------------------------------------------------- Debridement Details Patient Name: James Moreno. Date of Service: 03/25/2018 11:30 AM Medical Record Number: [id number] Patient Account Number: [id number] Date of Birth/Sex: 30-Sep-1965 (53 y.o. M) Treating RN: Cornell Barman Primary Care Provider: Lelon Huh Other Clinician: Referring Provider: Lelon Huh Treating Provider/Extender: Beather Arbour Weeks in Treatment: 5 Debridement Performed for Wound #2 Right Calcaneus Assessment: Performed By: Physician Beather Arbour, FNP Debridement Type: Debridement Severity of Tissue Pre Fat layer exposed Debridement: Level of Consciousness (Pre- Awake and Alert procedure): Pre-procedure Verification/Time Yes - 12:02 Out Taken: Start Time: 12:02 Pain Control: Lidocaine Total Area Debrided (L x W): 0.5 (cm) x 0.5 (cm) = 0.25 (cm) Tissue and other material Viable, Slough, Slough debrided: Level: Non-Viable Tissue Debridement Description: Selective/Open Wound Instrument: Curette Bleeding: Minimum Hemostasis Achieved: Pressure End Time: 12:04 Response to Treatment: Procedure was tolerated well Level  of Consciousness Awake and Alert (Post-procedure): Post Debridement Measurements of Total Wound Length: (cm) 0.5 Width: (cm) 0.5 Depth: (cm) 0.2 Volume: (cm) 0.039 Character of Wound/Ulcer Post Debridement: Stable Severity of Tissue Post Debridement: Limited to breakdown of skin Post Procedure Diagnosis Same as Pre-procedure Electronic Signature(s) Signed: 03/25/2018 4:45:10 PM By: Gretta Cool, BSN, RN, CWS, Kim RN, BSN Signed: 03/27/2018 12:47:52 AM By: Beather Arbour FNP-C Entered By: Gretta Cool BSN, RN, CWS, Kim on 03/25/2018 12:03:19 James Moreno, James Moreno ([id number]) -------------------------------------------------------------------------------- HPI Details Patient Name: James Moreno, James Moreno. Date of Service: 03/25/2018 11:30 AM Medical Record Number: [id number] Patient Account Number: [id number] Date of Birth/Sex: [birth year] (53 y.o. M) Treating RN: Cornell Barman Primary Care Provider: Lelon Huh Other Clinician: Referring Provider: Lelon Huh Treating Provider/Extender: Beather Arbour Weeks in Treatment: 5 History of Present Illness HPI Description: 10/24/17-He is seen in initial evaluation for a right posterior heel wound. He states approximately 2 months ago he sustained James Moreno injury while using a pumice/callus grater on his heels. In the time since that injury he has seen [facility], [facility], Dr. Cleda Mccreedy at [facility], [facility] or so, infectious disease, and urgent care. He is currently on no antibiotc therapy but has a history of being on keflex and bactrim (at time of injury) which was discontinued at presentation to [facility] ER (within a week after injury) and initiated on cipro and clindamycin. He did see infectious disease in [place] who recommended a CAT scan, he has not heard back about the CAT scan. He saw Dr. Cleda Mccreedy last week who referred him to [facility] for evaluation. he admits to not offloading, was never instructed to. He is on chronic  immunosuppression secondary to a renal transplant 2016. His diabetes is controlled with James Moreno A1c of 7.4 last week. He does admit to pain, relieved without need for medication. He is smoking, with a plan to quit smoking cigarettes by next Monday with individual goal to decrease the amount of nicotine and his vape to 0%. He has been encouraged to expedite the  smoking cessation. 10/31/17-He is seen in follow-up evaluation for right posterior heel wound. There is no deterioration, he remains pinpoint with scant amount of serous drainage. He was evaluated by [facility] yesterday (RIGHT: ABI 1.37, TBI 0.48; LEFT: ABI 1.06, TBI 0.3) with plans for angioplasty next week. We will continue with same treatment plan and he will follow up in two weeks. He was strongly encouraged to have complete smoking cessation prior to or immediately after angioplasty to increase success. READMISSION 02/12/18 This is a 53 year old man who is a type II diabetic. He was here for 2 visits in the summer at which time he had James Moreno area on the right posterior tip of his heel. He states he was removing callus with some form of instrument caused this wound. He was also followed by Dr. Sharlotte Alamo podiatry at [facility]. He states this eventually healed over with Hydrofera Blue. The patient saw vascular surgery at [facility] on 9/16 at which time the wound was healed. He states 2 or 3 weeks ago he was removing callus from the side of his heel using topical antibiotics and he opened a new wound in this area. He states that this is very painful in fact he states the other heel wound was also painful and the pain was not completely relieved when this closed over. He has known PAD apparently saw [facility] in August and they were planning for James Moreno angioplasty although the patient went for a second opinion at [facility] by Dr. Tamera Punt long who did not feel he needed James Moreno intervention at that time. Plans were being made to  follow this with follow-up arterial studies. He has not had any more recent arterial studies then were quoted in his note of 10/31/17. At that point he had noncompressible vessels on the right with James Moreno ABI of 1.37 and a TBI of 0.48. He has been washing this wound with soap and water and covering with a Band-Aid Past medical history includes hypertension, type 2 diabetes with PAD, stage III chronic renal failure status post kidney transplant. 02/19/2018 patient seen today for follow-up of right ankle wound. He is a type II diabetic. He is followed by the vascular Center at [facility]. Recent up x-ray obtained which was negative for any abnormalities. He is currently being treated with doxycycline status post a wound culture and support of sensitivity. This antibiotic was okay by his nephrology doctor as he is a kidney transplant in 2016. The wound does have surrounding erythema with mild edema and adherent slough at the wound bed of the site. He reports a lot of sensitivity to touch of the wound. No recent fever or chills. 03/05/2018 patient original wound culture revealed coag negative staph nevertheless this was treated with doxycycline with improvement. The patient states the pain is better he has been using Santyl. He came in today without his heel offloading sandal states he was never given a sandal. 03/12/2018; the patient arrives with more pain around the area on the right ankle. There is again surrounding erythema with some tenderness. As opposed to last week I was more convinced that the surface on this wound was not viable and therefore James Moreno, James R. ([id number]) debridement. I do not see anything to culture here. In talking to the patient he probably has 40 yard claudication in both legs. He emphasizes to me today that this wound that we are treating currently occurred spontaneously i.e. not due to trauma like the last time he was  here 03/25/18 Seen today for follow up and  management of right heel wound. He reports ongoing tenderness of wound with touch. Smal layer of slough present. He reports compliance with drsg changes. He has not been wearing offloading shoe as recommended. No s/s of wound infection today. Electronic Signature(s) Signed: 03/27/2018 12:47:52 AM By: Beather Arbour FNP-C Entered By: Beather Arbour on 03/25/2018 13:10:27 James Moreno ([id number]) -------------------------------------------------------------------------------- Physical Exam Details Patient Name: James Moreno, James Moreno. Date of Service: 03/25/2018 11:30 AM Medical Record Number: [id number] Patient Account Number: [id number] Date of Birth/Sex: 29-Oct-1965 (53 y.o. M) Treating RN: Cornell Barman Primary Care Provider: Lelon Huh Other Clinician: Referring Provider: Lelon Huh Treating Provider/Extender: Beather Arbour Weeks in Treatment: 5 Constitutional appears in no distress. Respiratory Bilateral breath sounds are clear and equal in all lobes with no wheezes, rales or rhonchi.. Cardiovascular Pedal pulses palpable and strong bilaterally.. Integumentary (Hair, Skin) open right heel wound. Notes Wound exam: small open wound right lateral heel. Small surrounding erythema. Area is painful to touch. Debrided surounding callous and slough from wound surface. Post debridement, wound is clean without bleeding. No bleeding during procedure. Electronic Signature(s) Signed: 03/27/2018 12:47:52 AM By: Beather Arbour FNP-C Entered By: Beather Arbour on 03/25/2018 13:35:54 James Moreno ([id number]) -------------------------------------------------------------------------------- Physician Orders Details Patient Name: ZYAD, BOOMER. Date of Service: 03/25/2018 11:30 AM Medical Record Number: [id number] Patient Account Number: [id number] Date of Birth/Sex: 06-26-1965 (53 y.o. M) Treating RN: Cornell Barman Primary Care Provider: Lelon Huh Other Clinician: Referring  Provider: Lelon Huh Treating Provider/Extender: Beather Arbour Weeks in Treatment: 5 Verbal / Phone Orders: No Diagnosis Coding Wound Cleansing Wound #2 Right Calcaneus o Clean wound with Normal Saline. Anesthetic (add to Medication List) Wound #2 Right Calcaneus o Topical Lidocaine 4% cream applied to wound bed prior to debridement (In Clinic Only). Primary Wound Dressing Wound #2 Right Calcaneus o Silver Alginate Secondary Dressing Wound #2 Right Calcaneus o Boardered Foam Dressing Dressing Change Frequency Wound #2 Right Calcaneus o Change dressing every other day. Follow-up Appointments Wound #2 Right Calcaneus o Return Appointment in 1 week. Edema Control o Elevate legs to the level of the heart and pump ankles as often as possible Off-Loading Wound #2 Right Calcaneus o Open toe surgical shoe to: - right Electronic Signature(s) Signed: 03/25/2018 4:45:10 PM By: Gretta Cool, BSN, RN, CWS, Kim RN, BSN Signed: 03/27/2018 12:47:52 AM By: Beather Arbour FNP-C Entered By: Gretta Cool BSN, RN, CWS, Kim on 03/25/2018 12:03:42 James Moreno, James Moreno ([id number]) -------------------------------------------------------------------------------- Problem List Details Patient Name: James Moreno, James Moreno. Date of Service: 03/25/2018 11:30 AM Medical Record Number: [id number] Patient Account Number: [id number] Date of Birth/Sex: Sep 05, 1965 (53 y.o. M) Treating RN: Cornell Barman Primary Care Provider: Lelon Huh Other Clinician: Referring Provider: Lelon Huh Treating Provider/Extender: Beather Arbour Weeks in Treatment: 5 Active Problems ICD-10 Evaluated Encounter Code Description Active Date Today Diagnosis E11.621 Type 2 diabetes mellitus with foot ulcer 02/12/2018 No Yes L97.411 Non-pressure chronic ulcer of right heel and midfoot limited 02/12/2018 No Yes to breakdown of skin Inactive Problems Resolved Problems Electronic Signature(s) Signed: 03/27/2018 12:47:52 AM  By: Beather Arbour FNP-C Entered By: Beather Arbour on 03/25/2018 13:02:47 James Moreno ([id number]) -------------------------------------------------------------------------------- Progress Note Details Patient Name: James Moreno. Date of Service: 03/25/2018 11:30 AM Medical Record Number: [id number] Patient Account Number: [id number] Date of Birth/Sex: September 08, 1965 (53 y.o. M) Treating RN: Cornell Barman Primary Care Provider: Lelon Huh Other Clinician: Referring Provider: Lelon Huh Treating Provider/Extender: Beather Arbour Weeks in Treatment: 5 Subjective Chief Complaint Information obtained  from Patient right heel wound 02/12/18; patient returns with a new wound over the right lateral heel History of Present Illness (HPI) 10/24/17-He is seen in initial evaluation for a right posterior heel wound. He states approximately 2 months ago he sustained James Moreno injury while using a pumice/callus grater on his heels. In the time since that injury he has seen [facility], [facility], Dr. Cleda Mccreedy at [facility], [facility], infectious disease, and urgent care. He is currently on no antibiotc therapy but has a history of being on keflex and bactrim (at time of injury) which was discontinued at presentation to [facility] ER (within a week after injury) and initiated on cipro and clindamycin. He did see infectious disease in [place] who recommended a CAT scan, he has not heard back about the CAT scan. He saw Dr. Cleda Mccreedy last week who referred him to [facility] for evaluation. he admits to not offloading, was never instructed to. He is on chronic immunosuppression secondary to a renal transplant 2016. His diabetes is controlled with James Moreno A1c of 7.4 last week. He does admit to pain, relieved without need for medication. He is smoking, with a plan to quit smoking cigarettes by next Monday with individual goal to decrease the amount of nicotine and his vape to 0%. He  has been encouraged to expedite the smoking cessation. 10/31/17-He is seen in follow-up evaluation for right posterior heel wound. There is no deterioration, he remains pinpoint with scant amount of serous drainage. He was evaluated by [facility] yesterday (RIGHT: ABI 1.37, TBI 0.48; LEFT: ABI 1.06, TBI 0.3) with plans for angioplasty next week. We will continue with same treatment plan and he will follow up in two weeks. He was strongly encouraged to have complete smoking cessation prior to or immediately after angioplasty to increase success. READMISSION 02/12/18 This is a 53 year old man who is a type II diabetic. He was here for 2 visits in the summer at which time he had James Moreno area on the right posterior tip of his heel. He states he was removing callus with some form of instrument caused this wound. He was also followed by Dr. Sharlotte Alamo podiatry at [facility]. He states this eventually healed over with Hydrofera Blue. The patient saw vascular surgery at [facility] on 9/16 at which time the wound was healed. He states 2 or 3 weeks ago he was removing callus from the side of his heel using topical antibiotics and he opened a new wound in this area. He states that this is very painful in fact he states the other heel wound was also painful and the pain was not completely relieved when this closed over. He has known PAD apparently saw [facility] in August and they were planning for James Moreno angioplasty although the patient went for a second opinion at [facility] by Dr. Tamera Punt long who did not feel he needed James Moreno intervention at that time. Plans were being made to follow this with follow-up arterial studies. He has not had any more recent arterial studies then were quoted in his note of 10/31/17. At that point he had noncompressible vessels on the right with James Moreno ABI of 1.37 and a TBI of 0.48. He has been washing this wound with soap and water and covering with a Band-Aid Past  medical history includes hypertension, type 2 diabetes with PAD, stage III chronic renal failure status post kidney transplant. 02/19/2018 patient seen today for follow-up of right ankle wound. He  is a type II diabetic. He is followed by the vascular Center at [facility]. Recent up x-ray obtained which was negative for any abnormalities. He is currently being treated with doxycycline status post a wound culture and support of sensitivity. This antibiotic was okay by his nephrology doctor as he is a kidney transplant in 2016. The wound does have surrounding erythema with mild edema and adherent slough at the [place] ([id number]) wound bed of the site. He reports a lot of sensitivity to touch of the wound. No recent fever or chills. 03/05/2018 patient original wound culture revealed coag negative staph nevertheless this was treated with doxycycline with improvement. The patient states the pain is better he has been using Santyl. He came in today without his heel offloading sandal states he was never given a sandal. 03/12/2018; the patient arrives with more pain around the area on the right ankle. There is again surrounding erythema with some tenderness. As opposed to last week I was more convinced that the surface on this wound was not viable and therefore debridement. I do not see anything to culture here. In talking to the patient he probably has 40 yard claudication in both legs. He emphasizes to me today that this wound that we are treating currently occurred spontaneously i.e. not due to trauma like the last time he was here 03/25/18 Seen today for follow up and management of right heel wound. He reports ongoing tenderness of wound with touch. Smal layer of slough present. He reports compliance with drsg changes. He has not been wearing offloading shoe as recommended. No s/s of wound infection today. Patient History Information obtained from Patient. Family History Cancer -  Father, Diabetes - Father,Paternal Grandparents, Kidney Disease, Lung Disease, No family history of Heart Disease, Hereditary Spherocytosis, Hypertension, Seizures, Stroke, Thyroid Problems, Tuberculosis. Social History Current every day smoker - 1/2 pack daily, Marital Status - Married, Alcohol Use - Rarely, Drug Use - No History, Caffeine Use - Daily. Medical History Hospitalization/Surgery History - 06/24/2016, [place], Kidney transplant. Medical And Surgical History Notes Genitourinary kidney transplant 07/24/2014 Review of Systems (ROS) Constitutional Symptoms (General Health) The patient has no complaints or symptoms. Respiratory The patient has no complaints or symptoms. Cardiovascular The patient has no complaints or symptoms. Integumentary (Skin) Complains or has symptoms of Wounds - right heel wound. Objective Constitutional appears in no distress. James Moreno, James Moreno ([id number]) Vitals Time Taken: 11:36 AM, Height: 71 in, Weight: 238 lbs, BMI: 33.2. General Notes: Patient refused vital being taken. States he has not had his medication today and does NOT want to know what his BP is. Respiratory Bilateral breath sounds are clear and equal in all lobes with no wheezes, rales or rhonchi.. Cardiovascular Pedal pulses palpable and strong bilaterally.. General Notes: Wound exam: small open wound right lateral heel. Small surrounding erythema. Area is painful to touch. Debrided surounding callous and slough from wound surface. Post debridement, wound is clean without bleeding. No bleeding during procedure. Integumentary (Hair, Skin) open right heel wound. Wound #2 status is Open. Original cause of wound was Gradually Appeared. The wound is located on the Right Calcaneus. The wound measures 0.5cm length x 0.5cm width x 0.2cm depth; 0.196cm^2 area and 0.039cm^3 volume. There is Fat Layer (Subcutaneous Tissue) Exposed exposed. There is no tunneling or undermining noted. There  is a medium amount of serosanguineous drainage noted. The wound margin is flat and intact. There is small (1-33%) pale granulation within the wound bed. There is a large (  67-100%) amount of necrotic tissue within the wound bed including Adherent Slough. The periwound skin appearance exhibited: Scarring, Dry/Scaly. The periwound skin appearance did not exhibit: Callus, Crepitus, Excoriation, Induration, Rash, Maceration, Atrophie Blanche, Cyanosis, Ecchymosis, Hemosiderin Staining, Mottled, Pallor, Rubor, Erythema. Periwound temperature was noted as No Abnormality. The periwound has tenderness on palpation. Assessment Active Problems ICD-10 Type 2 diabetes mellitus with foot ulcer Non-pressure chronic ulcer of right heel and midfoot limited to breakdown of skin Procedures Wound #2 Pre-procedure diagnosis of Wound #2 is a Diabetic Wound/Ulcer of the Lower Extremity located on the Right Calcaneus .Severity of Tissue Pre Debridement is: Fat layer exposed. There was a Selective/Open Wound Non-Viable Tissue Debridement with a total area of 0.25 sq cm performed by Beather Arbour, FNP. With the following instrument(s): Curette to remove Viable tissue/material. Material removed includes Morton after achieving pain control using Lidocaine. No specimens were taken. A time out was conducted at 12:02, prior to the start of the procedure. A Minimum amount of bleeding was controlled with Pressure. The procedure was tolerated well. Post Debridement Measurements: 0.5cm length x 0.5cm width x 0.2cm depth; 0.039cm^3 volume. Character of Wound/Ulcer Post Debridement is stable. Severity of Tissue Post Debridement is: Limited to breakdown of skin. Post procedure Diagnosis Wound #2: Same as Pre-Procedure James Moreno, James Moreno. ([id number]) Plan Wound Cleansing: Wound #2 Right Calcaneus: Clean wound with Normal Saline. Anesthetic (add to Medication List): Wound #2 Right Calcaneus: Topical Lidocaine 4% cream applied  to wound bed prior to debridement (In Clinic Only). Primary Wound Dressing: Wound #2 Right Calcaneus: Silver Alginate Secondary Dressing: Wound #2 Right Calcaneus: Boardered Foam Dressing Dressing Change Frequency: Wound #2 Right Calcaneus: Change dressing every other day. Follow-up Appointments: Wound #2 Right Calcaneus: Return Appointment in 1 week. Edema Control: Elevate legs to the level of the heart and pump ankles as often as possible Off-Loading: Wound #2 Right Calcaneus: Open toe surgical shoe to: - right Electronic Signature(s) Signed: 03/27/2018 12:47:52 AM By: Beather Arbour FNP-C Entered By: Beather Arbour on 03/25/2018 14:09:02 James Moreno ([id number]) -------------------------------------------------------------------------------- ROS/PFSH Details Patient Name: James Moreno. Date of Service: 03/25/2018 11:30 AM Medical Record Number: [id number] Patient Account Number: [id number] Date of Birth/Sex: [birth year] (53 y.o. M) Treating RN: Cornell Barman Primary Care Provider: Lelon Huh Other Clinician: Referring Provider: Lelon Huh Treating Provider/Extender: Beather Arbour Weeks in Treatment: 5 Information Obtained From Patient Wound History Do you currently have one or more open woundso No Have you tested positive for osteomyelitis (bone infection)o No Have you had any tests for circulation on your legso No Integumentary (Skin) Complaints and Symptoms: Positive for: Wounds - right heel wound Medical History: Negative for: History of Burn; History of pressure wounds Constitutional Symptoms (General Health) Complaints and Symptoms: No Complaints or Symptoms Eyes Medical History: Negative for: Cataracts; Glaucoma; Optic Neuritis Ear/Nose/Mouth/Throat Medical History: Negative for: Chronic sinus problems/congestion; Middle ear problems Hematologic/Lymphatic Medical History: Negative for: Anemia; Hemophilia; Human Immunodeficiency Virus;  Lymphedema; Sickle Cell Disease Respiratory Complaints and Symptoms: No Complaints or Symptoms Medical History: Negative for: Aspiration; Asthma; Chronic Obstructive Pulmonary Disease (COPD); Pneumothorax; Sleep Apnea; Tuberculosis Cardiovascular Complaints and Symptoms: No Complaints or Symptoms Medical History: James Moreno, James Moreno ([id number]) Positive for: Hypertension Negative for: Angina; Arrhythmia; Congestive Heart Failure; Coronary Artery Disease; Deep Vein Thrombosis; Hypotension; Myocardial Infarction; Peripheral Arterial Disease; Peripheral Venous Disease; Phlebitis; Vasculitis Gastrointestinal Medical History: Positive for: Hepatitis B Negative for: Cirrhosis ; Colitis; Crohnos; Hepatitis A; Hepatitis C Endocrine Medical History: Positive for: Type II Diabetes - 7.4% Negative  for: Type I Diabetes Time with diabetes: 30 years Treated with: Insulin Blood sugar tested every day: No Blood sugar testing results: Breakfast: 120 Genitourinary Medical History: Negative for: End Stage Renal Disease Past Medical History Notes: kidney transplant 07/24/2014 Immunological Medical History: Negative for: Lupus Erythematosus; Raynaudos; Scleroderma Musculoskeletal Medical History: Negative for: Gout; Rheumatoid Arthritis; Osteoarthritis; Osteomyelitis Neurologic Medical History: Negative for: Dementia; Neuropathy; Quadriplegia; Paraplegia; Seizure Disorder Oncologic Medical History: Negative for: Received Chemotherapy; Received Radiation Psychiatric Medical History: Negative for: Anorexia/bulimia; Confinement Anxiety Immunizations Pneumococcal Vaccine: Received Pneumococcal Vaccination: Yes Tetanus Vaccine: Last tetanus shot: 10/24/2016 James Moreno, James Moreno ([id number]) Implantable Devices Hospitalization / Surgery History Name of Hospital Purpose of Hospitalization/Surgery Date [facility] Kidney transplant 06/24/2016 Family and Social History Cancer: Yes - Father;  Diabetes: Yes - Father,Paternal Grandparents; Heart Disease: No; Hereditary Spherocytosis: No; Hypertension: No; Kidney Disease: Yes; Lung Disease: Yes; Seizures: No; Stroke: No; Thyroid Problems: No; Tuberculosis: No; Current every day smoker - 1/2 pack daily; Marital Status - Married; Alcohol Use: Rarely; Drug Use: No History; Caffeine Use: Daily; Financial Concerns: No; Food, Clothing or Shelter Needs: No; Support System Lacking: No; Transportation Concerns: No; Advanced Directives: Yes (Not Provided); Patient does not want information on Advanced Directives; Do not resuscitate: No; Living Will: Yes (Not Provided); Medical Power of Attorney: Yes (Not Provided) Physician Affirmation I have reviewed and agree with the above information. Electronic Signature(s) Signed: 03/25/2018 4:45:10 PM By: Gretta Cool, BSN, RN, CWS, Kim RN, BSN Signed: 03/27/2018 12:47:52 AM By: Beather Arbour FNP-C Entered By: Beather Arbour on 03/25/2018 13:11:51 James Moreno ([id number]) -------------------------------------------------------------------------------- Diamond Details Patient Name: ANTONIN, MEININGER. Date of Service: 03/25/2018 Medical Record Number: [id number] Patient Account Number: [id number] Date of Birth/Sex: 1965-11-10 (53 y.o. M) Treating RN: Cornell Barman Primary Care Provider: Lelon Huh Other Clinician: Referring Provider: Lelon Huh Treating Provider/Extender: Beather Arbour Weeks in Treatment: 5 Diagnosis Coding ICD-10 Codes Code Description E11.621 Type 2 diabetes mellitus with foot ulcer L97.411 Non-pressure chronic ulcer of right heel and midfoot limited to breakdown of skin Facility Procedures CPT4 Code Description: [id number] - [facility]BR[facility] WOUND 1ST 20 SQ CM OR < ICD-10 Diagnosis Description L97.411 Non-pressure chronic ulcer of right heel and midfoot limited t Modifier: o breakdown of Quantity: 1 skin Physician Procedures CPT4 Code Description: [id number] - WC PHYS  DEBR WO ANESTH 20 SQ CM ICD-10 Diagnosis Description L97.411 Non-pressure chronic ulcer of right heel and midfoot limited t Modifier: o breakdown of Quantity: 1 skin Electronic Signature(s) Signed: 03/27/2018 12:47:52 AM By: Beather Arbour FNP-C Entered By: Beather Arbour on 03/25/2018 14:09:16

## 2018-04-01 ENCOUNTER — Other Ambulatory Visit: Payer: Self-pay

## 2018-04-01 DIAGNOSIS — L905 Scar conditions and fibrosis of skin: Secondary | ICD-10-CM

## 2018-04-01 DIAGNOSIS — M79604 Pain in right leg: Secondary | ICD-10-CM

## 2018-04-01 DIAGNOSIS — R52 Pain, unspecified: Secondary | ICD-10-CM

## 2018-04-01 NOTE — Telephone Encounter (Signed)
Patient called office requesting refill on oxycodone sent to [facility] on [address]. KW

## 2018-04-02 ENCOUNTER — Ambulatory Visit: Payer: Medicare Other | Admitting: Internal Medicine

## 2018-04-02 NOTE — Telephone Encounter (Signed)
Last filled 03-05-18 which was two days early

## 2018-04-05 MED ORDER — OXYCODONE HCL 5 MG PO TABS: 5.0000 mg | ORAL_TABLET | Freq: Four times a day (QID) | ORAL | 0 refills | Status: DC | PRN

## 2018-04-09 ENCOUNTER — Encounter: Payer: Medicare Other | Attending: Internal Medicine | Admitting: Internal Medicine

## 2018-04-09 DIAGNOSIS — I129 Hypertensive chronic kidney disease with stage 1 through stage 4 chronic kidney disease, or unspecified chronic kidney disease: Secondary | ICD-10-CM | POA: Diagnosis not present

## 2018-04-09 DIAGNOSIS — E1122 Type 2 diabetes mellitus with diabetic chronic kidney disease: Secondary | ICD-10-CM | POA: Insufficient documentation

## 2018-04-09 DIAGNOSIS — N183 Chronic kidney disease, stage 3 (moderate): Secondary | ICD-10-CM | POA: Diagnosis not present

## 2018-04-09 DIAGNOSIS — Z940 Kidney transplant status: Secondary | ICD-10-CM | POA: Insufficient documentation

## 2018-04-09 DIAGNOSIS — F17210 Nicotine dependence, cigarettes, uncomplicated: Secondary | ICD-10-CM | POA: Insufficient documentation

## 2018-04-09 DIAGNOSIS — E11621 Type 2 diabetes mellitus with foot ulcer: Secondary | ICD-10-CM | POA: Diagnosis not present

## 2018-04-09 DIAGNOSIS — S91301A Unspecified open wound, right foot, initial encounter: Secondary | ICD-10-CM | POA: Diagnosis not present

## 2018-04-09 DIAGNOSIS — L97411 Non-pressure chronic ulcer of right heel and midfoot limited to breakdown of skin: Secondary | ICD-10-CM | POA: Insufficient documentation

## 2018-04-10 NOTE — Progress Notes (Signed)
James Moreno, James Moreno ([id number]) Visit Report for 04/09/2018 HPI Details Patient Name: James Moreno, James Moreno. Date of Service: 04/09/2018 1:30 PM Medical Record Number: [id number] Patient Account Number: [id number] Date of Birth/Sex: Nov 28, 1965 (53 y.o. M) Treating RN: James Moreno Primary Care Provider: Lelon Moreno Other Clinician: Referring Provider: Lelon Moreno Treating Provider/Extender: James Moreno in Treatment: 8 History of Present Illness HPI Description: 10/24/17-He is seen in initial evaluation for a right posterior heel wound. He states approximately 2 months ago he sustained an injury while using a pumice/callus grater on his heels. In the time since that injury he has seen [facility], [facility], [facility] at [facility], emerge or so, infectious disease, and urgent care. He is currently on no antibiotc therapy but has a history of being on keflex and bactrim (at time of injury) which was discontinued at presentation to [facility] ER (within a week after injury) and initiated on cipro and clindamycin. He did see infectious disease in [place] who recommended a CAT scan, he has not heard back about the CAT scan. He saw James Moreno last week who referred him to [place] [facility] for evaluation. he admits to not offloading, was never instructed to. He is on chronic immunosuppression secondary to a renal transplant 2016. His diabetes is controlled with an A1c of 7.4 last week. He does admit to pain, relieved without need for medication. He is smoking, with a plan to quit smoking cigarettes by next Monday with individual goal to decrease the amount of nicotine and his vape to 0%. He has been encouraged to expedite the smoking cessation. 10/31/17-He is seen in follow-up evaluation for right posterior heel wound. There is no deterioration, he remains pinpoint with scant amount of serous drainage. He was evaluated by [facility] yesterday (RIGHT:  ABI 1.37, TBI 0.48; LEFT: ABI 1.06, TBI 0.3) with plans for angioplasty next week. We will continue with same treatment plan and he will follow up in two weeks. He was strongly encouraged to have complete smoking cessation prior to or immediately after angioplasty to increase success. READMISSION 02/12/18 This is a 53 year old man who is a type II diabetic. He was here for 2 visits in the summer at which time he had an area on the right posterior tip of his heel. He states he was removing callus with some form of instrument caused this wound. He was also followed by [facility] podiatry at [facility]. He states this eventually healed over with Hydrofera Blue. The patient saw vascular surgery at [facility] on 9/16 at which time the wound was healed. He states 2 or 3 weeks ago he was removing callus from the side of his heel using topical antibiotics and he opened a new wound in this area. He states that this is very painful in fact he states the other heel wound was also painful and the pain was not completely relieved when this closed over. He has known PAD apparently saw [facility] in August and they were planning for an angioplasty although the patient went for a second opinion at [facility] by James Moreno who did not feel he needed an intervention at that time. Plans were being made to follow this with follow-up arterial studies. He has not had any more recent arterial studies then were quoted in his note of 10/31/17. At that point he had noncompressible vessels on the right with an ABI of 1.37 and a TBI of 0.48. He  has been washing this wound with soap and water and covering with a Band-Aid Past medical history includes hypertension, type 2 diabetes with PAD, stage III chronic renal failure status post kidney transplant. 02/19/2018 patient seen today for follow-up of right ankle wound. He is a type II diabetic. He is followed by the vascular Center at [facility]. Recent  up x-ray obtained which was negative for any abnormalities. He is currently being treated with doxycycline status post a wound culture and support of sensitivity. This antibiotic was okay by his nephrology doctor as he is a kidney transplant in 2016. The wound does have surrounding erythema with mild edema and adherent slough at the wound bed of the site. He reports a lot of sensitivity to touch of the wound. No recent fever or chills. 03/05/2018 patient original wound culture revealed coag negative staph nevertheless this was treated with doxycycline with [name], [name] ([id number]) improvement. The patient states the pain is better he has been using Santyl. He came in today without his heel offloading sandal states he was never given a sandal. 03/12/2018; the patient arrives with more pain around the area on the right ankle. There is again surrounding erythema with some tenderness. As opposed to last week I was more convinced that the surface on this wound was not viable and therefore debridement. I do not see anything to culture here. In talking to the patient he probably has 40 yard claudication in both legs. He emphasizes to me today that this wound that we are treating currently occurred spontaneously i.e. not due to trauma like the last time he was here 03/25/18 Seen today for follow up and management of right heel wound. He reports ongoing tenderness of wound with touch. Smal layer of slough present. He reports compliance with drsg changes. He has not been wearing offloading shoe as recommended. No s/s of wound infection today. 1/15; the patient's appointment with vascular at [facility] was put off till early February. He saw orthopedic surgery who is put him on Medihoney and something called Blast X. I am not sure what the latter is at this point. I told him to continue with the Medihoney. I think the patient has recurrent wounds on his feet he is a diabetic and he has claudication. I suspect  he needs to see vascular surgery which would undoubtedly lead to a invasive study. Nevertheless I told him I think that is what needs to be done. He was offered this at the local [facility] and he went to [facility] that is where we are right now. Electronic Signature(s) Signed: 04/09/2018 4:38:41 PM By: Linton Ham MD Entered By: Linton Ham on 04/09/2018 14:36:02 James Moreno ([id number]) -------------------------------------------------------------------------------- Physical Exam Details Patient Name: James Moreno, James Moreno. Date of Service: 04/09/2018 1:30 PM Medical Record Number: [id number] Patient Account Number: [id number] Date of Birth/Sex: [birth year] (53 y.o. M) Treating RN: James Moreno Primary Care Provider: Lelon Moreno Other Clinician: Referring Provider: Lelon Moreno Treating Provider/Extender: James Moreno in Treatment: 8 Constitutional Patient is hypertensive.. Supine Blood Pressure is within target range for patient.. Pulse regular and within target range for patient.Marland Kitchen Respirations regular, non-labored and within target range.. Temperature is normal and within the target range for the patient.Marland Kitchen appears in no distress. Notes Wound exam; very small now open area on the right lateral heel. I actually think this is smaller but what I can see of the wound surface does not look very viable. It probably needs debridement Medihoney certainly can  do this and that is what I advised him to have since he already has it. I am not sure what the other ointment is that he has been using Electronic Signature(s) Signed: 04/09/2018 4:38:41 PM By: Linton Ham MD Entered By: Linton Ham on 04/09/2018 14:37:30 James Moreno ([id number]) -------------------------------------------------------------------------------- Physician Orders Details Patient Name: James Moreno. Date of Service: 04/09/2018 1:30 PM Medical Record Number: [id number] Patient Account  Number: [id number] Date of Birth/Sex: 1965-09-19 (53 y.o. M) Treating RN: James Moreno Primary Care Provider: Lelon Moreno Other Clinician: Referring Provider: Lelon Moreno Treating Provider/Extender: James Moreno in Treatment: 8 Verbal / Phone Orders: No Diagnosis Coding Wound Cleansing Wound #2 Right Calcaneus o Clean wound with Normal Saline. Anesthetic (add to Medication List) Wound #2 Right Calcaneus o Topical Lidocaine 4% cream applied to wound bed prior to debridement (In Clinic Only). Primary Wound Dressing Wound #2 Right Calcaneus o Medihoney gel Secondary Dressing Wound #2 Right Calcaneus o Boardered Foam Dressing Dressing Change Frequency Wound #2 Right Calcaneus o Change dressing every day. Follow-up Appointments Wound #2 Right Calcaneus o Return Appointment in 2 weeks. Edema Control o Elevate legs to the level of the heart and pump ankles as often as possible Consults o Vascular - [facility] 05/05/2018 Electronic Signature(s) Signed: 04/09/2018 4:38:41 PM By: Linton Ham MD Signed: 04/09/2018 5:16:46 PM By: Gretta Cool, BSN, RN, CWS, Kim RN, BSN Entered By: Gretta Cool, BSN, RN, CWS, Kim on 04/09/2018 14:15:38 James Moreno, James Moreno ([id number]) -------------------------------------------------------------------------------- Problem List Details Patient Name: COSTANTINO, KOHLBECK. Date of Service: 04/09/2018 1:30 PM Medical Record Number: [id number] Patient Account Number: [id number] Date of Birth/Sex: 05/06/1965 (53 y.o. M) Treating RN: James Moreno Primary Care Provider: Lelon Moreno Other Clinician: Referring Provider: Lelon Moreno Treating Provider/Extender: James Moreno in Treatment: 8 Active Problems ICD-10 Evaluated Encounter Code Description Active Date Today Diagnosis E11.621 Type 2 diabetes mellitus with foot ulcer 02/12/2018 No Yes L97.411 Non-pressure chronic ulcer of right heel and midfoot limited 02/12/2018 No Yes to  breakdown of skin Inactive Problems Resolved Problems Electronic Signature(s) Signed: 04/09/2018 4:38:41 PM By: Linton Ham MD Entered By: Linton Ham on 04/09/2018 14:33:56 James Moreno ([id number]) -------------------------------------------------------------------------------- Progress Note Details Patient Name: James Moreno. Date of Service: 04/09/2018 1:30 PM Medical Record Number: [id number] Patient Account Number: [id number] Date of Birth/Sex: January 17, 1966 (53 y.o. M) Treating RN: James Moreno Primary Care Provider: Lelon Moreno Other Clinician: Referring Provider: Lelon Moreno Treating Provider/Extender: James Moreno in Treatment: 8 Subjective History of Present Illness (HPI) 10/24/17-He is seen in initial evaluation for a right posterior heel wound. He states approximately 2 months ago he sustained an injury while using a pumice/callus grater on his heels. In the time since that injury he has seen [facility], [facility], James [facility]no at [place] [facility], emerge or so, infectious disease, and urgent care. He is currently on no antibiotc therapy but has a history of being on keflex and bactrim (at time of injury) which was discontinued at presentation to [facility] ER (within a week after injury) and initiated on cipro and clindamycin. He did see infectious disease in [place] who recommended a CAT scan, he has not heard back about the CAT scan. He saw James Moreno last week who referred him to  [facility] for evaluation. he admits to not offloading, was never instructed to. He is on chronic immunosuppression secondary to a renal transplant 2016. His diabetes is controlled with an A1c of 7.4 last week. He does  admit to pain, relieved without need for medication. He is smoking, with a plan to quit smoking cigarettes by next Monday with individual goal to decrease the amount of nicotine and his vape to 0%. He has been encouraged to expedite  the smoking cessation. 10/31/17-He is seen in follow-up evaluation for right posterior heel wound. There is no deterioration, he remains pinpoint with scant amount of serous drainage. He was evaluated by [facility] yesterday (RIGHT: ABI 1.37, TBI 0.48; LEFT: ABI 1.06, TBI 0.3) with plans for angioplasty next week. We will continue with same treatment plan and he will follow up in two weeks. He was strongly encouraged to have complete smoking cessation prior to or immediately after angioplasty to increase success. READMISSION 02/12/18 This is a 53 year old man who is a type II diabetic. He was here for 2 visits in the summer at which time he had an area on the right posterior tip of his heel. He states he was removing callus with some form of instrument caused this wound. He was also followed by James Moreno podiatry at [facility]. He states this eventually healed over with Hydrofera Blue. The patient saw vascular surgery at [facility] on 9/16 at which time the wound was healed. He states 2 or 3 weeks ago he was removing callus from the side of his heel using topical antibiotics and he opened a new wound in this area. He states that this is very painful in fact he states the other heel wound was also painful and the pain was not completely relieved when this closed over. He has known PAD apparently saw [place] [facility] in August and they were planning for an angioplasty although the patient went for a second opinion at [facility] by James Moreno who did not feel he needed an intervention at that time. Plans were being made to follow this with follow-up arterial studies. He has not had any more recent arterial studies then were quoted in his note of 10/31/17. At that point he had noncompressible vessels on the right with an ABI of 1.37 and a TBI of 0.48. He has been washing this wound with soap and water and covering with a Band-Aid Past medical history includes hypertension,  type 2 diabetes with PAD, stage III chronic renal failure status post kidney transplant. 02/19/2018 patient seen today for follow-up of right ankle wound. He is a type II diabetic. He is followed by the vascular Center at [facility]. Recent up x-ray obtained which was negative for any abnormalities. He is currently being treated with doxycycline status post a wound culture and support of sensitivity. This antibiotic was okay by his nephrology doctor as he is a kidney transplant in 2016. The wound does have surrounding erythema with mild edema and adherent slough at the wound bed of the site. He reports a lot of sensitivity to touch of the wound. No recent fever or chills. 03/05/2018 patient original wound culture revealed coag negative staph nevertheless this was treated with doxycycline with improvement. The patient states the pain is better he has been using Santyl. He came in today without his heel offloading sandal states he was never given a sandal. 03/12/2018; the patient arrives with more pain around the area on the right ankle. There is again surrounding erythema with James Moreno, James R. ([id number]) some tenderness. As opposed to last week I was more convinced that the surface on this wound was not viable and therefore debridement. I do not see anything  to culture here. In talking to the patient he probably has 40 yard claudication in both legs. He emphasizes to me today that this wound that we are treating currently occurred spontaneously i.e. not due to trauma like the last time he was here 03/25/18 Seen today for follow up and management of right heel wound. He reports ongoing tenderness of wound with touch. Smal layer of slough present. He reports compliance with drsg changes. He has not been wearing offloading shoe as recommended. No s/s of wound infection today. 1/15; the patient's appointment with vascular at [facility] was put off till early February. He saw orthopedic surgery who is  put him on Medihoney and something called Blast X. I am not sure what the latter is at this point. I told him to continue with the Medihoney. I think the patient has recurrent wounds on his feet he is a diabetic and he has claudication. I suspect he needs to see vascular surgery which would undoubtedly lead to a invasive study. Nevertheless I told him I think that is what needs to be done. He was offered this at the local [facility] and he went to [facility] that is where we are right now. Objective Constitutional Patient is hypertensive.. Supine Blood Pressure is within target range for patient.. Pulse regular and within target range for patient.Marland Kitchen Respirations regular, non-labored and within target range.. Temperature is normal and within the target range for the patient.Marland Kitchen appears in no distress. Vitals Time Taken: 1:52 PM, Height: 71 in, Weight: 238 lbs, BMI: 33.2, Temperature: 98.3 F, Pulse: 101 bpm, Respiratory Rate: 16 breaths/min, Blood Pressure: 152/75 mmHg. General Notes: Wound exam; very small now open area on the right lateral heel. I actually think this is smaller but what I can see of the wound surface does not look very viable. It probably needs debridement Medihoney certainly can do this and that is what I advised him to have since he already has it. I am not sure what the other ointment is that he has been using Integumentary (Hair, Skin) Wound #2 status is Open. Original cause of wound was Gradually Appeared. The wound is located on the Right Calcaneus. The wound measures 0.4cm length x 0.4cm width x 0.2cm depth; 0.126cm^2 area and 0.025cm^3 volume. There is Fat Layer (Subcutaneous Tissue) Exposed exposed. There is no tunneling or undermining noted. There is a small amount of serous drainage noted. The wound margin is flat and intact. There is no granulation within the wound bed. There is a large (67-100%) amount of necrotic tissue within the wound bed including Adherent  Slough. The periwound skin appearance exhibited: Scarring, Dry/Scaly. The periwound skin appearance did not exhibit: Callus, Crepitus, Excoriation, Induration, Rash, Maceration, Atrophie Blanche, Cyanosis, Ecchymosis, Hemosiderin Staining, Mottled, Pallor, Rubor, Erythema. Periwound temperature was noted as No Abnormality. The periwound has tenderness on palpation. Assessment Active Problems ICD-10 Type 2 diabetes mellitus with foot ulcer James Moreno, James R. ([id number]) Non-pressure chronic ulcer of right heel and midfoot limited to breakdown of skin Plan Wound Cleansing: Wound #2 Right Calcaneus: Clean wound with Normal Saline. Anesthetic (add to Medication List): Wound #2 Right Calcaneus: Topical Lidocaine 4% cream applied to wound bed prior to debridement (In Clinic Only). Primary Wound Dressing: Wound #2 Right Calcaneus: Medihoney gel Secondary Dressing: Wound #2 Right Calcaneus: Boardered Foam Dressing Dressing Change Frequency: Wound #2 Right Calcaneus: Change dressing every day. Follow-up Appointments: Wound #2 Right Calcaneus: Return Appointment in 2 weeks. Edema Control: Elevate legs to the level of the  heart and pump ankles as often as possible Consults ordered were: Vascular - [facility] 05/05/2018 1. I recommended the could to continue the Medihoney and see if this will result in some debridement of the small open area that remains. Certainly could consider Santyl ointment as well. 2. I told the patient frankly I think he needs angiography. I am not sure what he thought the benefit was going to orthopedics at [facility] 3. Is follow-up vascular is now with [facility] at the beginning of February Electronic Signature(s) Signed: 04/09/2018 4:38:41 PM By: Linton Ham MD Entered By: Linton Ham on 04/09/2018 14:38:32 James Moreno ([id number]) -------------------------------------------------------------------------------- [place] Details Patient Name: James Moreno. Date of Service: 04/09/2018 Medical Record Number: [id number] Patient Account Number: [id number] Date of Birth/Sex: 04-09-1965 (53 y.o. M) Treating RN: James Moreno Primary Care Provider: Lelon Moreno Other Clinician: Referring Provider: Lelon Moreno Treating Provider/Extender: James [facility]no in Treatment: 8 Diagnosis Coding ICD-10 Codes Code Description E11.621 Type 2 diabetes mellitus with foot ulcer L97.411 Non-pressure chronic ulcer of right heel and midfoot limited to breakdown of skin Facility Procedures CPT4 Code: [id number] Description: [phone] - WOUND CARE VISIT-LEV 2 EST PT Modifier: Quantity: 1 Physician Procedures CPT4 Code Description: [id number] - WC PHYS LEVEL 2 - EST PT ICD-10 Diagnosis Description E11.621 Type 2 diabetes mellitus with foot ulcer L97.411 Non-pressure chronic ulcer of right heel and midfoot limited Modifier: to breakdown of Quantity: 1 skin Electronic Signature(s) Signed: 04/09/2018 4:38:41 PM By: Linton Ham MD Entered By: Linton Ham on 04/09/2018 14:38:53

## 2018-04-10 NOTE — Progress Notes (Signed)
James Moreno, James Moreno ([id number]) Visit Report for 04/09/2018 Arrival Information Details Patient Name: James Moreno, James Moreno. Date of Service: 04/09/2018 1:30 PM Medical Record Number: [id number] Patient Account Number: [id number] Date of Birth/Sex: 1965/07/17 (53 y.o. M) Treating Moreno: James Moreno Primary Care James Moreno: Lelon Huh Other Clinician: Referring James Moreno: Lelon Huh Treating James Moreno/Extender: James Moreno in Treatment: 8 Visit Information History Since Last Visit Added or deleted any medications: No Patient Arrived: Ambulatory Any new allergies or adverse reactions: No Arrival Time: 13:50 Had a fall or experienced change in No Accompanied By: self activities of daily living that may affect Transfer Assistance: None risk of falls: Patient Identification Verified: Yes Signs or symptoms of abuse/neglect since last visito No Secondary Verification Process Yes Hospitalized since last visit: No Completed: Implantable device outside of the clinic excluding No Patient Has Alerts: Yes cellular tissue based products placed in the center Patient Alerts: ABI 10/31/17 L 1.06 R since last visit: 1.37 Has Dressing in Place as Prescribed: Yes TBI L .3 R .48 Pain Present Now: No Electronic Signature(s) Signed: 04/09/2018 3:32:14 PM By: James Moreno Entered By: James Moreno on 04/09/2018 13:51:53 James Moreno ([id number]) -------------------------------------------------------------------------------- Clinic Level of Care Assessment Details Patient Name: James Moreno. Date of Service: 04/09/2018 1:30 PM Medical Record Number: [id number] Patient Account Number: [id number] Date of Birth/Sex: [birth year] (53 y.o. M) Treating Moreno: James Moreno Primary Care Aymen Widrig: Lelon Huh Other Clinician: Referring Zaven Klemens: Lelon Huh Treating James Moreno/Extender: James Moreno in Treatment: 8 Clinic Level of Care Assessment Items TOOL 4 Quantity Score []  - Use when  only an EandM is performed on FOLLOW-UP visit 0 ASSESSMENTS - Nursing Assessment / Reassessment []  - Reassessment of Co-morbidities (includes updates in patient status) 0 X- 1 5 Reassessment of Adherence to Treatment Plan ASSESSMENTS - Wound and Skin Assessment / Reassessment X - Simple Wound Assessment / Reassessment - one wound 1 5 []  - 0 Complex Wound Assessment / Reassessment - multiple wounds []  - 0 Dermatologic / Skin Assessment (not related to wound area) ASSESSMENTS - Focused Assessment []  - Circumferential Edema Measurements - multi extremities 0 []  - 0 Nutritional Assessment / Counseling / Intervention []  - 0 Lower Extremity Assessment (monofilament, tuning fork, pulses) []  - 0 Peripheral Arterial Disease Assessment (using hand held doppler) ASSESSMENTS - Ostomy and/or Continence Assessment and Care []  - Incontinence Assessment and Management 0 []  - 0 Ostomy Care Assessment and Management (repouching, etc.) PROCESS - Coordination of Care X - Simple Patient / Family Education for ongoing care 1 15 []  - 0 Complex (extensive) Patient / Family Education for ongoing care []  - 0 Staff obtains Programmer, systems, Records, Test Results / Process Orders []  - 0 Staff telephones HHA, Nursing Homes / Clarify orders / etc []  - 0 Routine Transfer to another Facility (non-emergent condition) []  - 0 Routine Hospital Admission (non-emergent condition) []  - 0 New Admissions / Biomedical engineer / Ordering NPWT, Apligraf, etc. []  - 0 Emergency Hospital Admission (emergent condition) X- 1 10 Simple Discharge Coordination James Moreno, James Moreno. ([id number]) []  - 0 Complex (extensive) Discharge Coordination PROCESS - Special Needs []  - Pediatric / Minor Patient Management 0 []  - 0 Isolation Patient Management []  - 0 Hearing / Language / Visual special needs []  - 0 Assessment of Community assistance (transportation, D/C planning, etc.) []  - 0 Additional assistance / Altered  mentation []  - 0 Support Surface(s) Assessment (bed, cushion, seat, etc.) INTERVENTIONS - Wound Cleansing / Measurement X - Simple Wound Cleansing - one wound  1 5 []  - 0 Complex Wound Cleansing - multiple wounds X- 1 5 Wound Imaging (photographs - any number of wounds) []  - 0 Wound Tracing (instead of photographs) X- 1 5 Simple Wound Measurement - one wound []  - 0 Complex Wound Measurement - multiple wounds INTERVENTIONS - Wound Dressings []  - Small Wound Dressing one or multiple wounds 0 X- 1 15 Medium Wound Dressing one or multiple wounds []  - 0 Large Wound Dressing one or multiple wounds []  - 0 Application of Medications - topical []  - 0 Application of Medications - injection INTERVENTIONS - Miscellaneous []  - External ear exam 0 []  - 0 Specimen Collection (cultures, biopsies, blood, body fluids, etc.) []  - 0 Specimen(s) / Culture(s) sent or taken to Lab for analysis []  - 0 Patient Transfer (multiple staff / Civil Service fast streamer / Similar devices) []  - 0 Simple Staple / Suture removal (25 or less) []  - 0 Complex Staple / Suture removal (26 or more) []  - 0 Hypo / Hyperglycemic Management (close monitor of Blood Glucose) []  - 0 Ankle / Brachial Index (ABI) - do not check if billed separately X- 1 5 Vital Signs James Moreno, James Moreno. ([id number]) Has the patient been seen at the hospital within the last three years: Yes Total Score: 70 Level Of Care: New/Established - Level 2 Electronic Signature(s) Signed: 04/09/2018 5:16:46 PM By: James Moreno, BSN, Moreno, CWS, James Moreno, BSN Entered By: James Moreno, BSN, Moreno, CWS, James on 04/09/2018 14:16:11 James Moreno ([id number]) -------------------------------------------------------------------------------- Encounter Discharge Information Details Patient Name: [name], Moreno. Date of Service: 04/09/2018 1:30 PM Medical Record Number: [id number] Patient Account Number: [id number] Date of Birth/Sex: [birth year] (53 y.o. M) Treating Moreno: Montey Hora Primary Care Natayla Cadenhead: Lelon Huh Other Clinician: Referring Angles Trevizo: Lelon Huh Treating Griffey Nicasio/Extender: James Moreno in Treatment: 8 Encounter Discharge Information Items Discharge Condition: Stable Ambulatory Status: Ambulatory Discharge Destination: Home Transportation: Private Auto Accompanied By: self Schedule Follow-up Appointment: Yes Clinical Summary of Care: Electronic Signature(s) Signed: 04/09/2018 2:59:46 PM By: Montey Hora Entered By: Montey Hora on 04/09/2018 14:59:45 James Moreno ([id number]) -------------------------------------------------------------------------------- Lower Extremity Assessment Details Patient Name: James Moreno, James Moreno. Date of Service: 04/09/2018 1:30 PM Medical Record Number: [id number] Patient Account Number: [id number] Date of Birth/Sex: 12/14/1965 (53 y.o. M) Treating Moreno: James Moreno Primary Care Lorell Thibodaux: Lelon Huh Other Clinician: Referring Arshdeep Bolger: Lelon Huh Treating Latima Hamza/Extender: James Moreno in Treatment: 8 Edema Assessment Assessed: [Left: No] [Right: No] Edema: [Left: N] [Right: o] Calf Left: Right: Point of Measurement: 34 cm From Medial Instep cm 38.5 cm Ankle Left: Right: Point of Measurement: 12 cm From Medial Instep cm 22 cm Vascular Assessment Claudication: Claudication Assessment [Right:None] Pulses: Dorsalis Pedis Palpable: [Right:Yes] Posterior Tibial Extremity colors, hair growth, and conditions: Extremity Color: [Right:Normal] Hair Growth on Extremity: [Right:No] Capillary Refill: [Right:< 3 seconds] Toe Nail Assessment Left: Right: Thick: No Discolored: No Deformed: No Improper Length and Hygiene: No Electronic Signature(s) Signed: 04/09/2018 3:32:14 PM By: James Moreno Entered By: James Moreno on 04/09/2018 14:02:22 James Moreno ([id number]) -------------------------------------------------------------------------------- Multi Wound Chart  Details Patient Name: James Moreno. Date of Service: 04/09/2018 1:30 PM Medical Record Number: [id number] Patient Account Number: [id number] Date of Birth/Sex: 08/25/1965 (53 y.o. M) Treating Moreno: James Moreno Primary Care Ceirra Belli: Lelon Huh Other Clinician: Referring Susanna Benge: Lelon Huh Treating Arlyn Bumpus/Extender: James Moreno in Treatment: 8 Vital Signs Height(in): 71 Pulse(bpm): 101 Weight(lbs): 238 Blood Pressure(mmHg): 152/75 Body Mass Index(BMI): 33 Temperature(F): 98.3 Respiratory Rate 16 (breaths/min): Photos: [N/A:N/A] Wound  Location: Right Calcaneus N/A N/A Wounding Event: Gradually Appeared N/A N/A Primary Etiology: Diabetic Wound/Ulcer of the N/A N/A Lower Extremity Comorbid History: Hypertension, Hepatitis B, N/A N/A Type II Diabetes Date Acquired: 01/22/2018 N/A N/A Weeks of Treatment: 8 N/A N/A Wound Status: Open N/A N/A Measurements L x W x D 0.4x0.4x0.2 N/A N/A (cm) Area (cm) : 0.126 N/A N/A Volume (cm) : 0.025 N/A N/A % Reduction in Area: 54.20% N/A N/A % Reduction in Volume: 7.40% N/A N/A Classification: Grade 1 N/A N/A Exudate Amount: Small N/A N/A Exudate Type: Serous N/A N/A Exudate Color: amber N/A N/A Wound Margin: Flat and Intact N/A N/A Granulation Amount: None Present (0%) N/A N/A Necrotic Amount: Large (67-100%) N/A N/A Exposed Structures: Fat Layer (Subcutaneous N/A N/A Tissue) Exposed: Yes Fascia: No Tendon: No Muscle: No Joint: No Bone: No James Moreno, James Moreno. ([id number]) Epithelialization: None N/A N/A Periwound Skin Texture: Scarring: Yes N/A N/A Excoriation: No Induration: No Callus: No Crepitus: No Rash: No Periwound Skin Moisture: Dry/Scaly: Yes N/A N/A Maceration: No Periwound Skin Color: Atrophie Blanche: No N/A N/A Cyanosis: No Ecchymosis: No Erythema: No Hemosiderin Staining: No Mottled: No Pallor: No Rubor: No Temperature: No Abnormality N/A N/A Tenderness on Palpation: Yes N/A  N/A Wound Preparation: Ulcer Cleansing: N/A N/A Rinsed/Irrigated with Saline Topical Anesthetic Applied: Other: lidocaine 4% Treatment Notes Electronic Signature(s) Signed: 04/09/2018 4:38:41 PM By: Linton Ham MD Entered By: Linton Ham on 04/09/2018 14:34:06 James Moreno ([id number]) -------------------------------------------------------------------------------- [facility] Wheeler Details Patient Name: James Moreno, James Moreno. Date of Service: 04/09/2018 1:30 PM Medical Record Number: [id number] Patient Account Number: [id number] Date of Birth/Sex: Mar 18, 1966 (53 y.o. M) Treating Moreno: James Moreno Primary Care Marcella Dunnaway: Lelon Huh Other Clinician: Referring Yousef Huge: Lelon Huh Treating Meribeth Vitug/Extender: James Moreno in Treatment: 8 Active Inactive Soft Tissue Infection Nursing Diagnoses: Potential for infection: soft tissue Goals: Signs and symptoms of infection will be recognized early to allow for prompt treatment Date Initiated: 02/19/2018 Target Resolution Date: 03/26/2018 Goal Status: Active Interventions: Assess signs and symptoms of infection every visit Treatment Activities: Culture and sensitivity : 02/19/2018 Systemic antibiotics : 02/19/2018 Notes: Wound/Skin Impairment Nursing Diagnoses: Impaired tissue integrity Knowledge deficit related to smoking impact on wound healing Knowledge deficit related to ulceration/compromised skin integrity Goals: Patient/caregiver will verbalize understanding of skin care regimen Date Initiated: 02/12/2018 Target Resolution Date: 03/14/2018 Goal Status: Active Ulcer/skin breakdown will have a volume reduction of 30% by week 4 Date Initiated: 02/12/2018 Target Resolution Date: 03/14/2018 Goal Status: Active Interventions: Assess patient/caregiver ability to obtain necessary supplies Assess patient/caregiver ability to perform ulcer/skin care regimen upon admission and as  needed Notes: Electronic Signature(s) James Moreno, James Moreno ([id number]) Signed: 04/09/2018 5:16:46 PM By: James Moreno, BSN, Moreno, CWS, James Moreno, BSN Entered By: James Moreno, BSN, Moreno, CWS, James on 04/09/2018 14:09:47 James Moreno ([id number]) -------------------------------------------------------------------------------- Pain Assessment Details Patient Name: James Moreno, James Moreno. Date of Service: 04/09/2018 1:30 PM Medical Record Number: [id number] Patient Account Number: [id number] Date of Birth/Sex: December 15, 1965 (53 y.o. M) Treating Moreno: James Moreno Primary Care Adekunle Rohrbach: Lelon Huh Other Clinician: Referring Ivelisse Culverhouse: Lelon Huh Treating Savannah Erbe/Extender: James Moreno in Treatment: 8 Active Problems Location of Pain Severity and Description of Pain Patient Has Paino No Site Locations Pain Management and Medication Current Pain Management: Notes pt denies any pain at this time. Electronic Signature(s) Signed: 04/09/2018 3:32:14 PM By: James Moreno Entered By: James Moreno on 04/09/2018 13:52:11 James Moreno ([id number]) -------------------------------------------------------------------------------- Patient/Caregiver Education Details Patient Name: James Moreno, James Moreno. Date of Service: 04/09/2018  1:30 PM Medical Record Number: [id number] Patient Account Number: [id number] Date of Birth/Gender: 1965-06-01 (53 y.o. M) Treating Moreno: James Moreno Primary Care Physician: Lelon Huh Other Clinician: Referring Physician: Lelon Huh Treating Physician/Extender: James Moreno in Treatment: 8 Education Assessment Education Provided To: Patient Education Topics Provided Pressure: Handouts: Pressure Ulcers: Care and Offloading, Preventing Pressure Ulcers Methods: Demonstration, Explain/Verbal Responses: State content correctly Electronic Signature(s) Signed: 04/09/2018 5:16:46 PM By: James Moreno, BSN, Moreno, CWS, James Moreno, BSN Entered By: James Moreno, BSN, Moreno, CWS, James on 04/09/2018  14:16:33 James Moreno ([id number]) -------------------------------------------------------------------------------- Wound Assessment Details Patient Name: James Moreno, James Moreno. Date of Service: 04/09/2018 1:30 PM Medical Record Number: [id number] Patient Account Number: [id number] Date of Birth/Sex: 08-Aug-1965 (53 y.o. M) Treating Moreno: James Moreno Primary Care Schneur Crowson: Lelon Huh Other Clinician: Referring Keatin Benham: Lelon Huh Treating Joann Kulpa/Extender: James Moreno in Treatment: 8 Wound Status Wound Number: 2 Primary Etiology: Diabetic Wound/Ulcer of the Lower Extremity Wound Location: Right Calcaneus Wound Status: Open Wounding Event: Gradually Appeared Comorbid Hypertension, Hepatitis B, Type II Diabetes Date Acquired: 01/22/2018 History: Weeks Of Treatment: 8 Clustered Wound: No Photos Photo Uploaded By: James Moreno on 04/09/2018 14:17:25 Wound Measurements Length: (cm) 0.4 % Reductio Width: (cm) 0.4 % Reductio Depth: (cm) 0.2 Epithelial Area: (cm) 0.126 Tunneling Volume: (cm) 0.025 Undermini n in Area: 54.2% n in Volume: 7.4% ization: None : No ng: No Wound Description Classification: Grade 1 Foul Odor Wound Margin: Flat and Intact Slough/Fi Exudate Amount: Small Exudate Type: Serous Exudate Color: amber After Cleansing: No brino Yes Wound Bed Granulation Amount: None Present (0%) Exposed Structure Necrotic Amount: Large (67-100%) Fascia Exposed: No Necrotic Quality: Adherent Slough Fat Layer (Subcutaneous Tissue) Exposed: Yes Tendon Exposed: No Muscle Exposed: No Joint Exposed: No Bone Exposed: No Periwound Skin Texture BRAYLYNN, GHAN R. ([id number]) Texture Color No Abnormalities Noted: No No Abnormalities Noted: No Callus: No Atrophie Blanche: No Crepitus: No Cyanosis: No Excoriation: No Ecchymosis: No Induration: No Erythema: No Rash: No Hemosiderin Staining: No Scarring: Yes Mottled: No Pallor: No Moisture Rubor:  No No Abnormalities Noted: No Dry / Scaly: Yes Temperature / Pain Maceration: No Temperature: No Abnormality Tenderness on Palpation: Yes Wound Preparation Ulcer Cleansing: Rinsed/Irrigated with Saline Topical Anesthetic Applied: Other: lidocaine 4%, Treatment Notes Wound #2 (Right Calcaneus) Notes medihoney and bordered foam dressing Electronic Signature(s) Signed: 04/09/2018 3:32:14 PM By: James Moreno Entered By: James Moreno on 04/09/2018 14:03:12 James Moreno ([id number]) -------------------------------------------------------------------------------- [place] Details Patient Name: James Moreno. Date of Service: 04/09/2018 1:30 PM Medical Record Number: [id number] Patient Account Number: [id number] Date of Birth/Sex: 06-Feb-1966 (53 y.o. M) Treating Moreno: James Moreno Primary Care Illene Sweeting: Lelon Huh Other Clinician: Referring Mai Longnecker: Lelon Huh Treating Akiva Josey/Extender: James Moreno in Treatment: 8 Vital Signs Time Taken: 13:52 Temperature (F): 98.3 Height (in): 71 Pulse (bpm): 101 Weight (lbs): 238 Respiratory Rate (breaths/min): 16 Body Mass Index (BMI): 33.2 Blood Pressure (mmHg): 152/75 Reference Range: 80 - 120 mg / dl Electronic Signature(s) Signed: 04/09/2018 3:32:14 PM By: James Moreno Entered BySecundino Moreno on 04/09/2018 13:54:08

## 2018-04-16 DIAGNOSIS — D2239 Melanocytic nevi of other parts of face: Secondary | ICD-10-CM | POA: Diagnosis not present

## 2018-04-16 DIAGNOSIS — D0439 Carcinoma in situ of skin of other parts of face: Secondary | ICD-10-CM | POA: Diagnosis not present

## 2018-04-16 DIAGNOSIS — A630 Anogenital (venereal) warts: Secondary | ICD-10-CM | POA: Diagnosis not present

## 2018-04-23 ENCOUNTER — Encounter: Payer: Medicare Other | Admitting: Internal Medicine

## 2018-04-23 DIAGNOSIS — E1122 Type 2 diabetes mellitus with diabetic chronic kidney disease: Secondary | ICD-10-CM | POA: Diagnosis not present

## 2018-04-23 DIAGNOSIS — N183 Chronic kidney disease, stage 3 (moderate): Secondary | ICD-10-CM | POA: Diagnosis not present

## 2018-04-23 DIAGNOSIS — Z940 Kidney transplant status: Secondary | ICD-10-CM | POA: Diagnosis not present

## 2018-04-23 DIAGNOSIS — I129 Hypertensive chronic kidney disease with stage 1 through stage 4 chronic kidney disease, or unspecified chronic kidney disease: Secondary | ICD-10-CM | POA: Diagnosis not present

## 2018-04-23 DIAGNOSIS — S91301A Unspecified open wound, right foot, initial encounter: Secondary | ICD-10-CM | POA: Diagnosis not present

## 2018-04-23 DIAGNOSIS — E11621 Type 2 diabetes mellitus with foot ulcer: Secondary | ICD-10-CM | POA: Diagnosis not present

## 2018-04-23 DIAGNOSIS — L97411 Non-pressure chronic ulcer of right heel and midfoot limited to breakdown of skin: Secondary | ICD-10-CM | POA: Diagnosis not present

## 2018-04-25 DIAGNOSIS — N2581 Secondary hyperparathyroidism of renal origin: Secondary | ICD-10-CM | POA: Diagnosis not present

## 2018-04-25 DIAGNOSIS — E1165 Type 2 diabetes mellitus with hyperglycemia: Secondary | ICD-10-CM | POA: Diagnosis not present

## 2018-04-25 NOTE — Progress Notes (Signed)
ELMOR, KOST ([id number]) Visit Report for 04/23/2018 HPI Details Patient Name: James Moreno, James Moreno. Date of Service: 04/23/2018 10:30 AM Medical Record Number: [id number] Patient Account Number: [id number] Date of Birth/Sex: 03/19/1966 (53 y.o. M) Treating RN: Cornell Barman Primary Care Provider: Lelon Huh Other Clinician: Referring Provider: Lelon Huh Treating Provider/Extender: Tito Dine in Treatment: 10 History of Present Illness HPI Description: 10/24/17-He is seen in initial evaluation for a right posterior heel wound. He states approximately 2 months ago he sustained an injury while using a pumice/callus grater on his heels. In the time since that injury he has seen [facility], [facility], Dr. Cleda Mccreedy at [place] [facility], emerge or so, infectious disease, and urgent care. He is currently on no antibiotc therapy but has a history of being on keflex and bactrim (at time of injury) which was discontinued at presentation to [facility] ER (within a week after injury) and initiated on cipro and clindamycin. He did see infectious disease in [place] who recommended a CAT scan, he has not heard back about the CAT scan. He saw Dr. Cleda Mccreedy last week who referred him to [facility] for evaluation. he admits to not offloading, was never instructed to. He is on chronic immunosuppression secondary to a renal transplant 2016. His diabetes is controlled with an A1c of 7.4 last week. He does admit to pain, relieved without need for medication. He is smoking, with a plan to quit smoking cigarettes by next Monday with individual goal to decrease the amount of nicotine and his vape to 0%. He has been encouraged to expedite the smoking cessation. 10/31/17-He is seen in follow-up evaluation for right posterior heel wound. There is no deterioration, he remains pinpoint with scant amount of serous drainage. He was evaluated by [facility] yesterday (RIGHT:  ABI 1.37, TBI 0.48; LEFT: ABI 1.06, TBI 0.3) with plans for angioplasty next week. We will continue with same treatment plan and he will follow up in two weeks. He was strongly encouraged to have complete smoking cessation prior to or immediately after angioplasty to increase success. READMISSION 02/12/18 This is a 53 year old man who is a type II diabetic. He was here for 2 visits in the summer at which time he had an area on the right posterior tip of his heel. He states he was removing callus with some form of instrument caused this wound. He was also followed by Dr. Sharlotte Alamo podiatry at [facility]. He states this eventually healed over with Hydrofera Blue. The patient saw vascular surgery at [facility] on 9/16 at which time the wound was healed. He states 2 or 3 weeks ago he was removing callus from the side of his heel using topical antibiotics and he opened a new wound in this area. He states that this is very painful in fact he states the other heel wound was also painful and the pain was not completely relieved when this closed over. He has known PAD apparently saw [facility] in August and they were planning for an angioplasty although the patient went for a second opinion at [facility] by Dr. Tamera Punt long who did not feel he needed an intervention at that time. Plans were being made to follow this with follow-up arterial studies. He has not had any more recent arterial studies then were quoted in his note of 10/31/17. At that point he had noncompressible vessels on the right with an ABI of 1.37 and a TBI of 0.48. He  has been washing this wound with soap and water and covering with a Band-Aid Past medical history includes hypertension, type 2 diabetes with PAD, stage III chronic renal failure status post kidney transplant. 02/19/2018 patient seen today for follow-up of right ankle wound. He is a type II diabetic. He is followed by the vascular Center at [facility]. Recent  up x-ray obtained which was negative for any abnormalities. He is currently being treated with doxycycline status post a wound culture and support of sensitivity. This antibiotic was okay by his nephrology doctor as he is a kidney transplant in 2016. The wound does have surrounding erythema with mild edema and adherent slough at the wound bed of the site. He reports a lot of sensitivity to touch of the wound. No recent fever or chills. 03/05/2018 patient original wound culture revealed coag negative staph nevertheless this was treated with doxycycline with ESTEVON, FLUKE R. ([id number]) improvement. The patient states the pain is better he has been using Santyl. He came in today without his heel offloading sandal states he was never given a sandal. 03/12/2018; the patient arrives with more pain around the area on the right ankle. There is again surrounding erythema with some tenderness. As opposed to last week I was more convinced that the surface on this wound was not viable and therefore debridement. I do not see anything to culture here. In talking to the patient he probably has 40 yard claudication in both legs. He emphasizes to me today that this wound that we are treating currently occurred spontaneously i.e. not due to trauma like the last time he was here 03/25/18 Seen today for follow up and management of right heel wound. He reports ongoing tenderness of wound with touch. Smal layer of slough present. He reports compliance with drsg changes. He has not been wearing offloading shoe as recommended. No s/s of wound infection today. 1/15; the patient's appointment with vascular at [facility] was put off till early February. He saw orthopedic surgery who is put him on Medihoney and something called Blast X. I am not sure what the latter is at this point. I told him to continue with the Medihoney. I think the patient has recurrent wounds on his feet he is a diabetic and he has claudication. I suspect  he needs to see vascular surgery which would undoubtedly lead to a invasive study. Nevertheless I told him I think that is what needs to be done. He was offered this at the local vein and vascular and he went to [facility] that is where we are right now. 1/29; patient's appointment at [facility] is February 10. He is using a combination of blast X which is some form of ointment that is advertised is reducing bioburden and meta honey perhaps on an alternating day basis although it is hard to tell. Wound is on the heel on the right lateral aspect. Tightly adherent debris. Certainly less tender Electronic Signature(s) Signed: 04/24/2018 9:49:40 AM By: Linton Ham MD Entered By: Linton Ham on 04/23/2018 12:04:03 [name] ([id number]) -------------------------------------------------------------------------------- Physical Exam Details Patient Name: AZEEZ, DUNKER. Date of Service: 04/23/2018 10:30 AM Medical Record Number: [id number] Patient Account Number: [id number] Date of Birth/Sex: 1965/08/10 (53 y.o. M) Treating RN: Cornell Barman Primary Care Provider: Lelon Huh Other Clinician: Referring Provider: Lelon Huh Treating Provider/Extender: Tito Dine in Treatment: 10 Constitutional Patient is hypertensive.. Pulse regular and within target range for patient.Marland Kitchen Respirations regular, non-labored and within target range.. Temperature is normal and within  the target range for the patient.Marland Kitchen appears in no distress. Cardiovascular Pedal pulses absent bilaterally.. Notes Wound exam; very small area again covered and tightly adherent nonviable material. There is much less tenderness around this which I think is an improvement. Electronic Signature(s) Signed: 04/24/2018 9:49:40 AM By: Linton Ham MD Entered By: Linton Ham on 04/23/2018 12:04:54 [name] ([id number]) -------------------------------------------------------------------------------- Physician  Orders Details Patient Name: ERICA, OSUNA. Date of Service: 04/23/2018 10:30 AM Medical Record Number: [id number] Patient Account Number: [id number] Date of Birth/Sex: [birth year] (53 y.o. M) Treating RN: Cornell Barman Primary Care Provider: Lelon Huh Other Clinician: Referring Provider: Lelon Huh Treating Provider/Extender: Tito Dine in Treatment: 10 Verbal / Phone Orders: No Diagnosis Coding Wound Cleansing Wound #2 Right Calcaneus o Clean wound with Normal Saline. Anesthetic (add to Medication List) Wound #2 Right Calcaneus o Topical Lidocaine 4% cream applied to wound bed prior to debridement (In Clinic Only). Primary Wound Dressing Wound #2 Right Calcaneus o Medihoney gel Secondary Dressing Wound #2 Right Calcaneus o Boardered Foam Dressing Dressing Change Frequency Wound #2 Right Calcaneus o Change dressing every day. Follow-up Appointments Wound #2 Right Calcaneus o Return Appointment in 2 weeks. Edema Control o Elevate legs to the level of the heart and pump ankles as often as possible Electronic Signature(s) Signed: 04/23/2018 6:00:27 PM By: Gretta Cool, BSN, RN, CWS, Kim RN, BSN Signed: 04/24/2018 9:49:40 AM By: Linton Ham MD Entered By: Gretta Cool, BSN, RN, CWS, Kim on 04/23/2018 11:06:16 ISMEAL, HEIDER ([id number]) -------------------------------------------------------------------------------- Problem List Details Patient Name: SWAYZE, PRIES. Date of Service: 04/23/2018 10:30 AM Medical Record Number: [id number] Patient Account Number: [id number] Date of Birth/Sex: Nov 19, 1965 (53 y.o. M) Treating RN: Cornell Barman Primary Care Provider: Lelon Huh Other Clinician: Referring Provider: Lelon Huh Treating Provider/Extender: Tito Dine in Treatment: 10 Active Problems ICD-10 Evaluated Encounter Code Description Active Date Today Diagnosis E11.621 Type 2 diabetes mellitus with foot ulcer 02/12/2018 No  Yes L97.411 Non-pressure chronic ulcer of right heel and midfoot limited 02/12/2018 No Yes to breakdown of skin Inactive Problems Resolved Problems Electronic Signature(s) Signed: 04/24/2018 9:49:40 AM By: Linton Ham MD Entered By: Linton Ham on 04/23/2018 12:02:52 [name] ([id number]) -------------------------------------------------------------------------------- Progress Note Details Patient Name: [name]. Date of Service: 04/23/2018 10:30 AM Medical Record Number: [id number] Patient Account Number: [id number] Date of Birth/Sex: [birth year] (53 y.o. M) Treating RN: Cornell Barman Primary Care Provider: Lelon Huh Other Clinician: Referring Provider: Lelon Huh Treating Provider/Extender: Tito Dine in Treatment: 10 Subjective History of Present Illness (HPI) 10/24/17-He is seen in initial evaluation for a right posterior heel wound. He states approximately 2 months ago he sustained an injury while using a pumice/callus grater on his heels. In the time since that injury he has seen [facility], [facility], Dr. Cleda Mccreedy at [place] [facility], [facility], infectious disease, and urgent care. He is currently on no antibiotc therapy but has a history of being on keflex and bactrim (at time of injury) which was discontinued at presentation to [facility] ER (within a week after injury) and initiated on cipro and clindamycin. He did see infectious disease in [place] who recommended a CAT scan, he has not heard back about the CAT scan. He saw Dr. Cleda Mccreedy last week who referred him to [facility] for evaluation. he admits to not offloading, was never instructed to. He is on chronic immunosuppression secondary to a renal transplant 2016. His diabetes is controlled with an A1c of 7.4  last week. He does admit to pain, relieved without need for medication. He is smoking, with a plan to quit smoking cigarettes by next Monday  with individual goal to decrease the amount of nicotine and his vape to 0%. He has been encouraged to expedite the smoking cessation. 10/31/17-He is seen in follow-up evaluation for right posterior heel wound. There is no deterioration, he remains pinpoint with scant amount of serous drainage. He was evaluated by [facility] yesterday (RIGHT: ABI 1.37, TBI 0.48; LEFT: ABI 1.06, TBI 0.3) with plans for angioplasty next week. We will continue with same treatment plan and he will follow up in two weeks. He was strongly encouraged to have complete smoking cessation prior to or immediately after angioplasty to increase success. READMISSION 02/12/18 This is a 53 year old man who is a type II diabetic. He was here for 2 visits in the summer at which time he had an area on the right posterior tip of his heel. He states he was removing callus with some form of instrument caused this wound. He was also followed by Dr. Sharlotte Alamo podiatry at [facility]. He states this eventually healed over with Hydrofera Blue. The patient saw vascular surgery at [facility] on 9/16 at which time the wound was healed. He states 2 or 3 weeks ago he was removing callus from the side of his heel using topical antibiotics and he opened a new wound in this area. He states that this is very painful in fact he states the other heel wound was also painful and the pain was not completely relieved when this closed over. He has known PAD apparently saw [facility] in August and they were planning for an angioplasty although the patient went for a second opinion at [facility] by Dr. Tamera Punt long who did not feel he needed an intervention at that time. Plans were being made to follow this with follow-up arterial studies. He has not had any more recent arterial studies then were quoted in his note of 10/31/17. At that point he had noncompressible vessels on the right with an ABI of 1.37 and a TBI of 0.48. He has been  washing this wound with soap and water and covering with a Band-Aid Past medical history includes hypertension, type 2 diabetes with PAD, stage III chronic renal failure status post kidney transplant. 02/19/2018 patient seen today for follow-up of right ankle wound. He is a type II diabetic. He is followed by the vascular Center at [facility]. Recent up x-ray obtained which was negative for any abnormalities. He is currently being treated with doxycycline status post a wound culture and support of sensitivity. This antibiotic was okay by his nephrology doctor as he is a kidney transplant in 2016. The wound does have surrounding erythema with mild edema and adherent slough at the wound bed of the site. He reports a lot of sensitivity to touch of the wound. No recent fever or chills. 03/05/2018 patient original wound culture revealed coag negative staph nevertheless this was treated with doxycycline with improvement. The patient states the pain is better he has been using Santyl. He came in today without his heel offloading sandal states he was never given a sandal. 03/12/2018; the patient arrives with more pain around the area on the right ankle. There is again surrounding erythema with AIJALON, KIRTZ R. ([id number]) some tenderness. As opposed to last week I was more convinced that the surface on this wound was not viable and therefore debridement. I  do not see anything to culture here. In talking to the patient he probably has 40 yard claudication in both legs. He emphasizes to me today that this wound that we are treating currently occurred spontaneously i.e. not due to trauma like the last time he was here 03/25/18 Seen today for follow up and management of right heel wound. He reports ongoing tenderness of wound with touch. Smal layer of slough present. He reports compliance with drsg changes. He has not been wearing offloading shoe as recommended. No s/s of wound infection today. 1/15;  the patient's appointment with vascular at [facility] was put off till early February. He saw orthopedic surgery who is put him on Medihoney and something called Blast X. I am not sure what the latter is at this point. I told him to continue with the Medihoney. I think the patient has recurrent wounds on his feet he is a diabetic and he has claudication. I suspect he needs to see vascular surgery which would undoubtedly lead to a invasive study. Nevertheless I told him I think that is what needs to be done. He was offered this at the local vein and vascular and he went to [facility] that is where we are right now. 1/29; patient's appointment at [facility] is February 10. He is using a combination of blast X which is some form of ointment that is advertised is reducing bioburden and meta honey perhaps on an alternating day basis although it is hard to tell. Wound is on the heel on the right lateral aspect. Tightly adherent debris. Certainly less tender Objective Constitutional Patient is hypertensive.. Pulse regular and within target range for patient.Marland Kitchen Respirations regular, non-labored and within target range.. Temperature is normal and within the target range for the patient.Marland Kitchen appears in no distress. Vitals Time Taken: 10:45 AM, Height: 71 in, Weight: 238 lbs, BMI: 33.2, Temperature: 97.6 F, Pulse: 91 bpm, Respiratory Rate: 16 breaths/min, Blood Pressure: 163/81 mmHg. Cardiovascular Pedal pulses absent bilaterally.. General Notes: Wound exam; very small area again covered and tightly adherent nonviable material. There is much less tenderness around this which I think is an improvement. Integumentary (Hair, Skin) Wound #2 status is Open. Original cause of wound was Gradually Appeared. The wound is located on the Right Calcaneus. The wound measures 0.5cm length x 0.5cm width x 0.1cm depth; 0.196cm^2 area and 0.02cm^3 volume. There is Fat Layer (Subcutaneous Tissue) Exposed exposed. There is no tunneling or  undermining noted. There is a none present amount of drainage noted. The wound margin is flat and intact. There is no granulation within the wound bed. There is a large (67-100%) amount of necrotic tissue within the wound bed including Adherent Slough. The periwound skin appearance exhibited: Scarring, Dry/Scaly. The periwound skin appearance did not exhibit: Callus, Crepitus, Excoriation, Induration, Rash, Maceration, Atrophie Blanche, Cyanosis, Ecchymosis, Hemosiderin Staining, Mottled, Pallor, Rubor, Erythema. Periwound temperature was noted as No Abnormality. The periwound has tenderness on palpation. Assessment MAIKA, KACZMAREK ([id number]) Active Problems ICD-10 Type 2 diabetes mellitus with foot ulcer Non-pressure chronic ulcer of right heel and midfoot limited to breakdown of skin Plan Wound Cleansing: Wound #2 Right Calcaneus: Clean wound with Normal Saline. Anesthetic (add to Medication List): Wound #2 Right Calcaneus: Topical Lidocaine 4% cream applied to wound bed prior to debridement (In Clinic Only). Primary Wound Dressing: Wound #2 Right Calcaneus: Medihoney gel Secondary Dressing: Wound #2 Right Calcaneus: Boardered Foam Dressing Dressing Change Frequency: Wound #2 Right Calcaneus: Change dressing every day. Follow-up Appointments: Wound #2 Right  Calcaneus: Return Appointment in 2 weeks. Edema Control: Elevate legs to the level of the heart and pump ankles as often as possible 1. I would like him to use medi honey on this to see if it would loosen the surface of this wound 2. He sees vascular on February 10 at [facility]. The question is does he need an angiogram. In my mind he does he has limiting claudication and a nonhealing wound. 3. He does not generally comply with the types of dressings we recommend for now I have recommended Medihoney Electronic Signature(s) Signed: 04/24/2018 9:49:40 AM By: Linton Ham MD Entered By: Linton Ham on 04/23/2018  12:07:08 [name] ([id number]) -------------------------------------------------------------------------------- [place] Details Patient Name: [name]. Date of Service: 04/23/2018 Medical Record Number: [id number] Patient Account Number: [id number] Date of Birth/Sex: 01/19/1966 (53 y.o. M) Treating RN: Cornell Barman Primary Care Provider: Lelon Huh Other Clinician: Referring Provider: Lelon Huh Treating Provider/Extender: Tito Dine in Treatment: 10 Diagnosis Coding ICD-10 Codes Code Description E11.621 Type 2 diabetes mellitus with foot ulcer L97.411 Non-pressure chronic ulcer of right heel and midfoot limited to breakdown of skin Facility Procedures CPT4 Code: [id number] Description: 99213 - WOUND CARE VISIT-LEV 3 EST PT Modifier: Quantity: 1 Physician Procedures CPT4 Code Description: [id number] - WC PHYS LEVEL 2 - EST PT ICD-10 Diagnosis Description E11.621 Type 2 diabetes mellitus with foot ulcer L97.411 Non-pressure chronic ulcer of right heel and midfoot limited Modifier: to breakdown of Quantity: 1 skin Electronic Signature(s) Signed: 04/24/2018 9:49:40 AM By: Linton Ham MD Entered By: Linton Ham on 04/23/2018 12:07:24

## 2018-04-28 ENCOUNTER — Telehealth: Payer: Self-pay | Admitting: Family Medicine

## 2018-04-28 NOTE — Telephone Encounter (Signed)
Pt will need a refill on his alprazolam (XANAX) 2 MG tablet Next month.  He is asking if this can be filled or will he need to come in to have it filled?  Please advise.  Thanks, [employer]

## 2018-04-29 MED ORDER — ALPRAZOLAM 2 MG PO TABS: ORAL_TABLET | ORAL | 3 refills | Status: DC

## 2018-04-30 ENCOUNTER — Other Ambulatory Visit: Payer: Self-pay | Admitting: Family Medicine

## 2018-04-30 DIAGNOSIS — R52 Pain, unspecified: Secondary | ICD-10-CM

## 2018-04-30 DIAGNOSIS — M79604 Pain in right leg: Secondary | ICD-10-CM

## 2018-04-30 DIAGNOSIS — L905 Scar conditions and fibrosis of skin: Secondary | ICD-10-CM

## 2018-04-30 MED ORDER — OXYCODONE HCL 5 MG PO TABS: 5.0000 mg | ORAL_TABLET | Freq: Four times a day (QID) | ORAL | 0 refills | Status: DC | PRN

## 2018-04-30 NOTE — Telephone Encounter (Signed)
Patient needs refill on Oxycodone 5 mg. Sent to [facility] on [address].

## 2018-05-05 DIAGNOSIS — I739 Peripheral vascular disease, unspecified: Secondary | ICD-10-CM | POA: Diagnosis not present

## 2018-05-05 DIAGNOSIS — I7389 Other specified peripheral vascular diseases: Secondary | ICD-10-CM | POA: Diagnosis not present

## 2018-05-05 DIAGNOSIS — E1165 Type 2 diabetes mellitus with hyperglycemia: Secondary | ICD-10-CM | POA: Diagnosis not present

## 2018-05-05 DIAGNOSIS — E08311 Diabetes mellitus due to underlying condition with unspecified diabetic retinopathy with macular edema: Secondary | ICD-10-CM | POA: Diagnosis not present

## 2018-05-05 DIAGNOSIS — E118 Type 2 diabetes mellitus with unspecified complications: Secondary | ICD-10-CM | POA: Diagnosis not present

## 2018-05-05 DIAGNOSIS — I10 Essential (primary) hypertension: Secondary | ICD-10-CM | POA: Diagnosis not present

## 2018-05-05 DIAGNOSIS — E0859 Diabetes mellitus due to underlying condition with other circulatory complications: Secondary | ICD-10-CM | POA: Diagnosis not present

## 2018-05-05 DIAGNOSIS — N189 Chronic kidney disease, unspecified: Secondary | ICD-10-CM | POA: Diagnosis not present

## 2018-05-05 DIAGNOSIS — N2581 Secondary hyperparathyroidism of renal origin: Secondary | ICD-10-CM | POA: Diagnosis not present

## 2018-05-05 DIAGNOSIS — E1140 Type 2 diabetes mellitus with diabetic neuropathy, unspecified: Secondary | ICD-10-CM | POA: Diagnosis not present

## 2018-05-05 DIAGNOSIS — E785 Hyperlipidemia, unspecified: Secondary | ICD-10-CM | POA: Diagnosis not present

## 2018-05-05 DIAGNOSIS — E1121 Type 2 diabetes mellitus with diabetic nephropathy: Secondary | ICD-10-CM | POA: Diagnosis not present

## 2018-05-05 DIAGNOSIS — L97419 Non-pressure chronic ulcer of right heel and midfoot with unspecified severity: Secondary | ICD-10-CM | POA: Diagnosis not present

## 2018-05-07 ENCOUNTER — Encounter: Payer: Medicare Other | Attending: Internal Medicine | Admitting: Internal Medicine

## 2018-05-07 DIAGNOSIS — E11621 Type 2 diabetes mellitus with foot ulcer: Secondary | ICD-10-CM | POA: Insufficient documentation

## 2018-05-07 DIAGNOSIS — I10 Essential (primary) hypertension: Secondary | ICD-10-CM | POA: Insufficient documentation

## 2018-05-07 DIAGNOSIS — N183 Chronic kidney disease, stage 3 (moderate): Secondary | ICD-10-CM | POA: Insufficient documentation

## 2018-05-07 DIAGNOSIS — E1122 Type 2 diabetes mellitus with diabetic chronic kidney disease: Secondary | ICD-10-CM | POA: Insufficient documentation

## 2018-05-07 DIAGNOSIS — F17210 Nicotine dependence, cigarettes, uncomplicated: Secondary | ICD-10-CM | POA: Diagnosis not present

## 2018-05-07 DIAGNOSIS — E1151 Type 2 diabetes mellitus with diabetic peripheral angiopathy without gangrene: Secondary | ICD-10-CM | POA: Insufficient documentation

## 2018-05-07 DIAGNOSIS — S91301A Unspecified open wound, right foot, initial encounter: Secondary | ICD-10-CM | POA: Diagnosis not present

## 2018-05-07 DIAGNOSIS — Z940 Kidney transplant status: Secondary | ICD-10-CM | POA: Insufficient documentation

## 2018-05-07 DIAGNOSIS — L97411 Non-pressure chronic ulcer of right heel and midfoot limited to breakdown of skin: Secondary | ICD-10-CM | POA: Diagnosis not present

## 2018-05-09 NOTE — Progress Notes (Signed)
James Moreno, James Moreno ([id number]) Visit Report for 05/07/2018 HPI Details Patient Name: James Moreno, James Moreno. Date of Service: 05/07/2018 10:30 AM Medical Record Number: [id number] Patient Account Number: [id number] Date of Birth/Sex: Mar 18, 1966 (53 y.o. M) Treating RN: Cornell Barman Primary Care Provider: Lelon Huh Other Clinician: Referring Provider: Lelon Huh Treating Provider/Extender: Tito Dine in Treatment: 12 History of Present Illness HPI Description: 10/24/17-He is seen in initial evaluation for a right posterior heel wound. He states approximately 2 months ago he sustained an injury while using a pumice/callus grater on his heels. In the time since that injury he has seen [facility], [facility], Dr. Cleda Mccreedy at [place] [facility], [facility], infectious disease, and urgent care. He is currently on no antibiotc therapy but has a history of being on keflex and bactrim (at time of injury) which was discontinued at presentation to [facility] ER (within a week after injury) and initiated on cipro and clindamycin. He did see infectious disease in [place] who recommended a CAT scan, he has not heard back about the CAT scan. He saw Dr. Cleda Mccreedy last week who referred him to [facility] for evaluation. he admits to not offloading, was never instructed to. He is on chronic immunosuppression secondary to a renal transplant 2016. His diabetes is controlled with an A1c of 7.4 last week. He does admit to pain, relieved without need for medication. He is smoking, with a plan to quit smoking cigarettes by next Monday with individual goal to decrease the amount of nicotine and his vape to 0%. He has been encouraged to expedite the smoking cessation. 10/31/17-He is seen in follow-up evaluation for right posterior heel wound. There is no deterioration, he remains pinpoint with scant amount of serous drainage. He was evaluated by [facility] yesterday (RIGHT:  ABI 1.37, TBI 0.48; LEFT: ABI 1.06, TBI 0.3) with plans for angioplasty next week. We will continue with same treatment plan and he will follow up in two weeks. He was strongly encouraged to have complete smoking cessation prior to or immediately after angioplasty to increase success. READMISSION 02/12/18 This is a 53 year old man who is a type II diabetic. He was here for 2 visits in the summer at which time he had an area on the right posterior tip of his heel. He states he was removing callus with some form of instrument caused this wound. He was also followed by Dr. Sharlotte Alamo podiatry at [facility]. He states this eventually healed over with Hydrofera Blue. The patient saw vascular surgery at [facility] on 9/16 at which time the wound was healed. He states 2 or 3 weeks ago he was removing callus from the side of his heel using topical antibiotics and he opened a new wound in this area. He states that this is very painful in fact he states the other heel wound was also painful and the pain was not completely relieved when this closed over. He has known PAD apparently saw [facility] in August and they were planning for an angioplasty although the patient went for a second opinion at [facility] by Dr. Tamera Punt long who did not feel he needed an intervention at that time. Plans were being made to follow this with follow-up arterial studies. He has not had any more recent arterial studies then were quoted in his note of 10/31/17. At that point he had noncompressible vessels on the right with an ABI of 1.37 and a TBI of 0.48. He  has been washing this wound with soap and water and covering with a Band-Aid Past medical history includes hypertension, type 2 diabetes with PAD, stage III chronic renal failure status post kidney transplant. 02/19/2018 patient seen today for follow-up of right ankle wound. He is a type II diabetic. He is followed by the vascular Center at [facility]. Recent  up x-ray obtained which was negative for any abnormalities. He is currently being treated with doxycycline status post a wound culture and support of sensitivity. This antibiotic was okay by his nephrology doctor as he is a kidney transplant in 2016. The wound does have surrounding erythema with mild edema and adherent slough at the wound bed of the site. He reports a lot of sensitivity to touch of the wound. No recent fever or chills. 03/05/2018 patient original wound culture revealed coag negative staph nevertheless this was treated with doxycycline with MERCY, MALENA R. ([id number]) improvement. The patient states the pain is better he has been using Santyl. He came in today without his heel offloading sandal states he was never given a sandal. 03/12/2018; the patient arrives with more pain around the area on the right ankle. There is again surrounding erythema with some tenderness. As opposed to last week I was more convinced that the surface on this wound was not viable and therefore debridement. I do not see anything to culture here. In talking to the patient he probably has 40 yard claudication in both legs. He emphasizes to me today that this wound that we are treating currently occurred spontaneously i.e. not due to trauma like the last time he was here 03/25/18 Seen today for follow up and management of right heel wound. He reports ongoing tenderness of wound with touch. Smal layer of slough present. He reports compliance with drsg changes. He has not been wearing offloading shoe as recommended. No s/s of wound infection today. 1/15; the patient's appointment with vascular at [facility] was put off till early February. He saw orthopedic surgery who is put him on Medihoney and something called Blast X. I am not sure what the latter is at this point. I told him to continue with the Medihoney. I think the patient has recurrent wounds on his feet he is a diabetic and he has claudication. I suspect  he needs to see vascular surgery which would undoubtedly lead to a invasive study. Nevertheless I told him I think that is what needs to be done. He was offered this at the local vein and vascular and he went to [facility] that is where we are right now. 1/29; patient's appointment at [facility] is February 10. He is using a combination of blast X which is some form of ointment that is advertised is reducing bioburden and meta honey perhaps on an alternating day basis although it is hard to tell. Wound is on the heel on the right lateral aspect. Tightly adherent debris. Certainly less tender 2/12; patient appointment with the Center surgeon was 2 days ago although I cannot actually see his note. Apparently the plan is for him to come back to do that preparation for angiography at the end of March. He states his wound is a lot better using meta honey less pain Electronic Signature(s) Signed: 05/07/2018 6:19:03 PM By: Linton Ham MD Entered By: Linton Ham on 05/07/2018 12:08:00 James Moreno ([id number]) -------------------------------------------------------------------------------- Physical Exam Details Patient Name: James Moreno, James Moreno. Date of Service: 05/07/2018 10:30 AM Medical Record Number: [id number] Patient Account Number: [id number] Date of Birth/Sex: 20-Aug-1965 (  53 y.o. M) Treating RN: Cornell Barman Primary Care Provider: Lelon Huh Other Clinician: Referring Provider: Lelon Huh Treating Provider/Extender: Tito Dine in Treatment: 12 Constitutional Sitting or standing Blood Pressure is within target range for patient.. Pulse regular and within target range for patient.Marland Kitchen Respirations regular, non-labored and within target range.. Temperature is normal and within the target range for the patient.Marland Kitchen appears in no distress. Respiratory Respiratory effort is easy and symmetric bilaterally. Rate is normal at rest and on room air.. Cardiovascular I cannot feel his  popliteal. Pedal pulses are absent at the dorsalis pedis and posterior tibial.. Integumentary (Hair, Skin) No primary skin issues are seen. Psychiatric No evidence of depression, anxiety, or agitation. Calm, cooperative, and communicative. Appropriate interactions and affect.. Notes Wound exam; very small area. The wound was washed off with gauze and saline. The base of this looks pale. There is no tenderness around the wound area which is quite a big improvement. No evidence of infection is seen Electronic Signature(s) Signed: 05/07/2018 6:19:03 PM By: Linton Ham MD Entered By: Linton Ham on 05/07/2018 12:10:11 James Moreno ([id number]) -------------------------------------------------------------------------------- Physician Orders Details Patient Name: James Moreno, James Moreno. Date of Service: 05/07/2018 10:30 AM Medical Record Number: [id number] Patient Account Number: [id number] Date of Birth/Sex: 05/01/1965 (53 y.o. M) Treating RN: Cornell Barman Primary Care Provider: Lelon Huh Other Clinician: Referring Provider: Lelon Huh Treating Provider/Extender: Tito Dine in Treatment: 24 Verbal / Phone Orders: No Diagnosis Coding Wound Cleansing Wound #2 Right Calcaneus o Clean wound with Normal Saline. Anesthetic (add to Medication List) Wound #2 Right Calcaneus o Topical Lidocaine 4% cream applied to wound bed prior to debridement (In Clinic Only). Primary Wound Dressing Wound #2 Right Calcaneus o Medihoney gel Secondary Dressing Wound #2 Right Calcaneus o Boardered Foam Dressing Dressing Change Frequency Wound #2 Right Calcaneus o Change dressing every day. Follow-up Appointments Wound #2 Right Calcaneus o Return Appointment in 2 weeks. Edema Control o Elevate legs to the level of the heart and pump ankles as often as possible Electronic Signature(s) Signed: 05/07/2018 6:19:03 PM By: Linton Ham MD Signed: 05/08/2018 8:43:41 AM  By: Gretta Cool, BSN, RN, CWS, Kim RN, BSN Entered By: Gretta Cool, BSN, RN, CWS, Kim on 05/07/2018 11:36:58 James Moreno, James Moreno ([id number]) -------------------------------------------------------------------------------- Problem List Details Patient Name: James Moreno, James Moreno. Date of Service: 05/07/2018 10:30 AM Medical Record Number: [id number] Patient Account Number: [id number] Date of Birth/Sex: 04-23-1965 (53 y.o. M) Treating RN: Cornell Barman Primary Care Provider: Lelon Huh Other Clinician: Referring Provider: Lelon Huh Treating Provider/Extender: Tito Dine in Treatment: 12 Active Problems ICD-10 Evaluated Encounter Code Description Active Date Today Diagnosis E11.621 Type 2 diabetes mellitus with foot ulcer 02/12/2018 No Yes L97.411 Non-pressure chronic ulcer of right heel and midfoot limited 02/12/2018 No Yes to breakdown of skin E11.51 Type 2 diabetes mellitus with diabetic peripheral angiopathy 05/07/2018 No Yes without gangrene Inactive Problems Resolved Problems Electronic Signature(s) Signed: 05/07/2018 6:19:03 PM By: Linton Ham MD Entered By: Linton Ham on 05/07/2018 12:03:10 James Moreno ([id number]) -------------------------------------------------------------------------------- Progress Note Details Patient Name: James Moreno. Date of Service: 05/07/2018 10:30 AM Medical Record Number: [id number] Patient Account Number: [id number] Date of Birth/Sex: [birth year] (53 y.o. M) Treating RN: Cornell Barman Primary Care Provider: Lelon Huh Other Clinician: Referring Provider: Lelon Huh Treating Provider/Extender: Tito Dine in Treatment: 12 Subjective History of Present Illness (HPI) 10/24/17-He is seen in initial evaluation for a right posterior heel wound. He states approximately 2 months ago he sustained an  injury while using a pumice/callus grater on his heels. In the time since that injury he has seen [facility],  [facility] room, Dr. Cleda Mccreedy at [place] [facility], [facility], infectious disease, and urgent care. He is currently on no antibiotc therapy but has a history of being on keflex and bactrim (at time of injury) which was discontinued at presentation to [facility] ER (within a week after injury) and initiated on cipro and clindamycin. He did see infectious disease in [place] who recommended a CAT scan, he has not heard back about the CAT scan. He saw Dr. Cleda Mccreedy last week who referred him to [facility] for evaluation. he admits to not offloading, was never instructed to. He is on chronic immunosuppression secondary to a renal transplant 2016. His diabetes is controlled with an A1c of 7.4 last week. He does admit to pain, relieved without need for medication. He is smoking, with a plan to quit smoking cigarettes by next Monday with individual goal to decrease the amount of nicotine and his vape to 0%. He has been encouraged to expedite the smoking cessation. 10/31/17-He is seen in follow-up evaluation for right posterior heel wound. There is no deterioration, he remains pinpoint with scant amount of serous drainage. He was evaluated by [facility] yesterday (RIGHT: ABI 1.37, TBI 0.48; LEFT: ABI 1.06, TBI 0.3) with plans for angioplasty next week. We will continue with same treatment plan and he will follow up in two weeks. He was strongly encouraged to have complete smoking cessation prior to or immediately after angioplasty to increase success. READMISSION 02/12/18 This is a 53 year old man who is a type II diabetic. He was here for 2 visits in the summer at which time he had an area on the right posterior tip of his heel. He states he was removing callus with some form of instrument caused this wound. He was also followed by Dr. Sharlotte Alamo podiatry at [facility]. He states this eventually healed over with Hydrofera Blue. The patient saw vascular surgery at [facility] on  9/16 at which time the wound was healed. He states 2 or 3 weeks ago he was removing callus from the side of his heel using topical antibiotics and he opened a new wound in this area. He states that this is very painful in fact he states the other heel wound was also painful and the pain was not completely relieved when this closed over. He has known PAD apparently saw [facility] in August and they were planning for an angioplasty although the patient went for a second opinion at [facility] by Dr. Tamera Punt long who did not feel he needed an intervention at that time. Plans were being made to follow this with follow-up arterial studies. He has not had any more recent arterial studies then were quoted in his note of 10/31/17. At that point he had noncompressible vessels on the right with an ABI of 1.37 and a TBI of 0.48. He has been washing this wound with soap and water and covering with a Band-Aid Past medical history includes hypertension, type 2 diabetes with PAD, stage III chronic renal failure status post kidney transplant. 02/19/2018 patient seen today for follow-up of right ankle wound. He is a type II diabetic. He is followed by the vascular Center at [facility]. Recent up x-ray obtained which was negative for any abnormalities. He is currently being treated with doxycycline status post a wound culture and support of sensitivity. This antibiotic  was okay by his nephrology doctor as he is a kidney transplant in 2016. The wound does have surrounding erythema with mild edema and adherent slough at the wound bed of the site. He reports a lot of sensitivity to touch of the wound. No recent fever or chills. 03/05/2018 patient original wound culture revealed coag negative staph nevertheless this was treated with doxycycline with improvement. The patient states the pain is better he has been using Santyl. He came in today without his heel offloading sandal states he was never given a  sandal. 03/12/2018; the patient arrives with more pain around the area on the right ankle. There is again surrounding erythema with James Moreno, James R. ([id number]) some tenderness. As opposed to last week I was more convinced that the surface on this wound was not viable and therefore debridement. I do not see anything to culture here. In talking to the patient he probably has 40 yard claudication in both legs. He emphasizes to me today that this wound that we are treating currently occurred spontaneously i.e. not due to trauma like the last time he was here 03/25/18 Seen today for follow up and management of right heel wound. He reports ongoing tenderness of wound with touch. Smal layer of slough present. He reports compliance with drsg changes. He has not been wearing offloading shoe as recommended. No s/s of wound infection today. 1/15; the patient's appointment with vascular at [facility] was put off till early February. He saw orthopedic surgery who is put him on Medihoney and something called Blast X. I am not sure what the latter is at this point. I told him to continue with the Medihoney. I think the patient has recurrent wounds on his feet he is a diabetic and he has claudication. I suspect he needs to see vascular surgery which would undoubtedly lead to a invasive study. Nevertheless I told him I think that is what needs to be done. He was offered this at the local vein and vascular and he went to [facility] that is where we are right now. 1/29; patient's appointment at [facility] is February 10. He is using a combination of blast X which is some form of ointment that is advertised is reducing bioburden and meta honey perhaps on an alternating day basis although it is hard to tell. Wound is on the heel on the right lateral aspect. Tightly adherent debris. Certainly less tender 2/12; patient appointment with the [place] surgeon was 2 days ago although I cannot actually see his note. Apparently the plan is for  him to come back to do that preparation for angiography at the end of March. He states his wound is a lot better using meta honey less pain Objective Constitutional Sitting or standing Blood Pressure is within target range for patient.. Pulse regular and within target range for patient.Marland Kitchen Respirations regular, non-labored and within target range.. Temperature is normal and within the target range for the patient.Marland Kitchen appears in no distress. Vitals Time Taken: 10:34 AM, Height: 71 in, Weight: 238 lbs, BMI: 33.2, Temperature: 98.2 F, Pulse: 85 bpm, Respiratory Rate: 16 breaths/min, Blood Pressure: 152/70 mmHg. Respiratory Respiratory effort is easy and symmetric bilaterally. Rate is normal at rest and on room air.. Cardiovascular I cannot feel his popliteal. Pedal pulses are absent at the dorsalis pedis and posterior tibial.. Psychiatric No evidence of depression, anxiety, or agitation. Calm, cooperative, and communicative. Appropriate interactions and affect.. General Notes: Wound exam; very small area. The wound was washed off with gauze and  saline. The base of this looks pale. There is no tenderness around the wound area which is quite a big improvement. No evidence of infection is seen Integumentary (Hair, Skin) No primary skin issues are seen. Wound #2 status is Open. Original cause of wound was Gradually Appeared. The wound is located on the Right Calcaneus. The wound measures 0.5cm length x 0.6cm width x 0.1cm depth; 0.236cm^2 area and 0.024cm^3 volume. There is Fat Layer (Subcutaneous Tissue) Exposed exposed. There is no tunneling or undermining noted. There is a none present amount of James Moreno, James R. ([id number]) drainage noted. The wound margin is flat and intact. There is no granulation within the wound bed. There is a large (67-100%) amount of necrotic tissue within the wound bed including Adherent Slough. The periwound skin appearance exhibited: Scarring, Dry/Scaly. The periwound  skin appearance did not exhibit: Callus, Crepitus, Excoriation, Induration, Rash, Maceration, Atrophie Blanche, Cyanosis, Ecchymosis, Hemosiderin Staining, Mottled, Pallor, Rubor, Erythema. Periwound temperature was noted as No Abnormality. The periwound has tenderness on palpation. Assessment Active Problems ICD-10 Type 2 diabetes mellitus with foot ulcer Non-pressure chronic ulcer of right heel and midfoot limited to breakdown of skin Type 2 diabetes mellitus with diabetic peripheral angiopathy without gangrene Plan Wound Cleansing: Wound #2 Right Calcaneus: Clean wound with Normal Saline. Anesthetic (add to Medication List): Wound #2 Right Calcaneus: Topical Lidocaine 4% cream applied to wound bed prior to debridement (In Clinic Only). Primary Wound Dressing: Wound #2 Right Calcaneus: Medihoney gel Secondary Dressing: Wound #2 Right Calcaneus: Boardered Foam Dressing Dressing Change Frequency: Wound #2 Right Calcaneus: Change dressing every day. Follow-up Appointments: Wound #2 Right Calcaneus: Return Appointment in 2 weeks. Edema Control: Elevate legs to the level of the heart and pump ankles as often as possible 1. The patient is using Medihoney. I will see how this does. He has been resistant to most changes I made to this. 2. Plans for revascularization are underway Electronic Signature(s) Signed: 05/07/2018 6:19:03 PM By: Linton Ham MD Entered By: Linton Ham on 05/07/2018 12:11:43 James Moreno, James Moreno ([id number]) James Moreno, James Moreno ([id number]) -------------------------------------------------------------------------------- [place] Details Patient Name: James Moreno, James Moreno. Date of Service: 05/07/2018 Medical Record Number: [id number] Patient Account Number: [id number] Date of Birth/Sex: 12/09/1965 (53 y.o. M) Treating RN: Cornell Barman Primary Care Provider: Lelon Huh Other Clinician: Referring Provider: Lelon Huh Treating Provider/Extender: Tito Dine in Treatment: 12 Diagnosis Coding ICD-10 Codes Code Description E11.621 Type 2 diabetes mellitus with foot ulcer L97.411 Non-pressure chronic ulcer of right heel and midfoot limited to breakdown of skin E11.51 Type 2 diabetes mellitus with diabetic peripheral angiopathy without gangrene Facility Procedures CPT4 Code: [id number] Description: [phone] - WOUND CARE VISIT-LEV 2 EST PT Modifier: Quantity: 1 Physician Procedures CPT4 Code Description: [id number] - WC PHYS LEVEL 3 - EST PT ICD-10 Diagnosis Description L97.411 Non-pressure chronic ulcer of right heel and midfoot limited E11.621 Type 2 diabetes mellitus with foot ulcer Modifier: to breakdown of Quantity: 1 skin Electronic Signature(s) Signed: 05/07/2018 12:49:45 PM By: Gretta Cool, BSN, RN, CWS, Kim RN, BSN Signed: 05/07/2018 6:19:03 PM By: Linton Ham MD Entered By: Gretta Cool, BSN, RN, CWS, Kim on 05/07/2018 12:49:45

## 2018-05-12 NOTE — Progress Notes (Signed)
AXIL, COPEMAN ([id number]) Visit Report for 05/07/2018 Arrival Information Details Patient Name: James Moreno, James Moreno. Date of Service: 05/07/2018 10:30 AM Medical Record Number: [id number] Patient Account Number: [id number] Date of Birth/Sex: October 15, 1965 (53 y.o. M) Treating RN: Cornell Barman Primary Care Naryiah Schley: Lelon Huh Other Clinician: Referring Dale Strausser: Lelon Huh Treating Alexx Giambra/Extender: Tito Dine in Treatment: 12 Visit Information History Since Last Visit Added or deleted any medications: No Patient Arrived: Ambulatory Any new allergies or adverse reactions: No Arrival Time: 10:33 Had a fall or experienced change in No Accompanied By: self activities of daily living that may affect Transfer Assistance: None risk of falls: Patient Identification Verified: Yes Signs or symptoms of abuse/neglect since last visito No Secondary Verification Process Yes Hospitalized since last visit: No Completed: Implantable device outside of the clinic excluding No Patient Has Alerts: Yes cellular tissue based products placed in the center Patient Alerts: ABI 10/31/17 L 1.06 R since last visit: 1.37 Has Dressing in Place as Prescribed: Yes TBI L .3 R .48 Pain Present Now: No Electronic Signature(s) Signed: 05/07/2018 3:41:08 PM By: Lorine Bears RCP, RRT, CHT Entered By: Lorine Bears on 05/07/2018 10:34:11 James Moreno ([id number]) -------------------------------------------------------------------------------- Clinic Level of Care Assessment Details Patient Name: James Moreno, James Moreno. Date of Service: 05/07/2018 10:30 AM Medical Record Number: [id number] Patient Account Number: [id number] Date of Birth/Sex: 02-06-1966 (53 y.o. M) Treating RN: Cornell Barman Primary Care Renna Kilmer: Lelon Huh Other Clinician: Referring Jericka Kadar: Lelon Huh Treating Shadee Montoya/Extender: Tito Dine in Treatment: 12 Clinic Level of Care  Assessment Items TOOL 4 Quantity Score []  - Use when only an EandM is performed on FOLLOW-UP visit 0 ASSESSMENTS - Nursing Assessment / Reassessment []  - Reassessment of Co-morbidities (includes updates in patient status) 0 X- 1 5 Reassessment of Adherence to Treatment Plan ASSESSMENTS - Wound and Skin Assessment / Reassessment X - Simple Wound Assessment / Reassessment - one wound 1 5 []  - 0 Complex Wound Assessment / Reassessment - multiple wounds []  - 0 Dermatologic / Skin Assessment (not related to wound area) ASSESSMENTS - Focused Assessment []  - Circumferential Edema Measurements - multi extremities 0 []  - 0 Nutritional Assessment / Counseling / Intervention []  - 0 Lower Extremity Assessment (monofilament, tuning fork, pulses) []  - 0 Peripheral Arterial Disease Assessment (using hand held doppler) ASSESSMENTS - Ostomy and/or Continence Assessment and Care []  - Incontinence Assessment and Management 0 []  - 0 Ostomy Care Assessment and Management (repouching, etc.) PROCESS - Coordination of Care []  - Simple Patient / Family Education for ongoing care 0 []  - 0 Complex (extensive) Patient / Family Education for ongoing care []  - 0 Staff obtains Programmer, systems, Records, Test Results / Process Orders []  - 0 Staff telephones HHA, Nursing Homes / Clarify orders / etc []  - 0 Routine Transfer to another Facility (non-emergent condition) []  - 0 Routine Hospital Admission (non-emergent condition) []  - 0 New Admissions / Biomedical engineer / Ordering NPWT, Apligraf, etc. []  - 0 Emergency Hospital Admission (emergent condition) X- 1 10 Simple Discharge Coordination RENAN, James Moreno. ([id number]) []  - 0 Complex (extensive) Discharge Coordination PROCESS - Special Needs []  - Pediatric / Minor Patient Management 0 []  - 0 Isolation Patient Management []  - 0 Hearing / Language / Visual special needs []  - 0 Assessment of Community assistance (transportation, D/C planning,  etc.) []  - 0 Additional assistance / Altered mentation []  - 0 Support Surface(s) Assessment (bed, cushion, seat, etc.) INTERVENTIONS - Wound Cleansing / Measurement X - Simple Wound  Cleansing - one wound 1 5 []  - 0 Complex Wound Cleansing - multiple wounds X- 1 5 Wound Imaging (photographs - any number of wounds) []  - 0 Wound Tracing (instead of photographs) X- 1 5 Simple Wound Measurement - one wound []  - 0 Complex Wound Measurement - multiple wounds INTERVENTIONS - Wound Dressings []  - Small Wound Dressing one or multiple wounds 0 X- 1 15 Medium Wound Dressing one or multiple wounds []  - 0 Large Wound Dressing one or multiple wounds []  - 0 Application of Medications - topical []  - 0 Application of Medications - injection INTERVENTIONS - Miscellaneous []  - External ear exam 0 []  - 0 Specimen Collection (cultures, biopsies, blood, body fluids, etc.) []  - 0 Specimen(s) / Culture(s) sent or taken to Lab for analysis []  - 0 Patient Transfer (multiple staff / Civil Service fast streamer / Similar devices) []  - 0 Simple Staple / Suture removal (25 or less) []  - 0 Complex Staple / Suture removal (26 or more) []  - 0 Hypo / Hyperglycemic Management (close monitor of Blood Glucose) []  - 0 Ankle / Brachial Index (ABI) - do not check if billed separately X- 1 5 Vital Signs James Moreno, James Moreno. ([id number]) Has the patient been seen at the hospital within the last three years: Yes Total Score: 55 Level Of Care: New/Established - Level 2 Electronic Signature(s) Signed: 05/08/2018 8:43:41 AM By: Gretta Cool, BSN, RN, CWS, Kim RN, BSN Entered By: Gretta Cool, BSN, RN, CWS, Kim on 05/07/2018 12:48:59 James Moreno ([id number]) -------------------------------------------------------------------------------- Encounter Discharge Information Details Patient Name: James Moreno, James Moreno. Date of Service: 05/07/2018 10:30 AM Medical Record Number: [id number] Patient Account Number: [id number] Date of Birth/Sex:  1965-09-14 (53 y.o. M) Treating RN: Cornell Barman Primary Care Emagene Merfeld: Lelon Huh Other Clinician: Referring Tonantzin Mimnaugh: Lelon Huh Treating Emon Lance/Extender: Tito Dine in Treatment: 12 Encounter Discharge Information Items Discharge Condition: Stable Ambulatory Status: Ambulatory Discharge Destination: Home Transportation: Private Auto Accompanied By: self Schedule Follow-up Appointment: Yes Clinical Summary of Care: Electronic Signature(s) Signed: 05/08/2018 8:43:41 AM By: Gretta Cool, BSN, RN, CWS, Kim RN, BSN Entered By: Gretta Cool, BSN, RN, CWS, Kim on 05/07/2018 11:42:39 James Moreno ([id number]) -------------------------------------------------------------------------------- Lower Extremity Assessment Details Patient Name: James Moreno, James Moreno. Date of Service: 05/07/2018 10:30 AM Medical Record Number: [id number] Patient Account Number: [id number] Date of Birth/Sex: 1965-06-05 (53 y.o. M) Treating RN: Army Melia Primary Care Tiwan Schnitker: Lelon Huh Other Clinician: Referring Larin Weissberg: Lelon Huh Treating Carr Shartzer/Extender: Tito Dine in Treatment: 12 Edema Assessment Assessed: [Left: No] [Right: No] Edema: [Left: N] [Right: o] Vascular Assessment Pulses: Dorsalis Pedis Palpable: [Right:Yes] Posterior Tibial Extremity colors, hair growth, and conditions: Extremity Color: [Right:Normal] Hair Growth on Extremity: [Right:Yes] Temperature of Extremity: [Right:Warm] Capillary Refill: [Right:< 3 seconds] Toe Nail Assessment Left: Right: Thick: No Discolored: No Deformed: No Improper Length and Hygiene: No Electronic Signature(s) Signed: 05/12/2018 8:46:05 AM By: Army Melia Entered By: Army Melia on 05/07/2018 10:41:14 James Moreno ([id number]) -------------------------------------------------------------------------------- Multi Wound Chart Details Patient Name: James Moreno. Date of Service: 05/07/2018 10:30 AM Medical  Record Number: [id number] Patient Account Number: [id number] Date of Birth/Sex: April 02, 1965 (53 y.o. M) Treating RN: Cornell Barman Primary Care Ebony Rickel: Lelon Huh Other Clinician: Referring Odarius Dines: Lelon Huh Treating Fremon Zacharia/Extender: Tito Dine in Treatment: 12 Vital Signs Height(in): 71 Pulse(bpm): 85 Weight(lbs): 238 Blood Pressure(mmHg): 152/70 Body Mass Index(BMI): 33 Temperature(F): 98.2 Respiratory Rate 16 (breaths/min): Photos: [2:No Photos] [N/A:N/A] Wound Location: [2:Right Calcaneus] [N/A:N/A] Wounding Event: [2:Gradually Appeared] [N/A:N/A] Primary Etiology: [2:Diabetic Wound/Ulcer of  the Lower Extremity] [N/A:N/A] Comorbid History: [2:Hypertension, Hepatitis B, Type II Diabetes] [N/A:N/A] Date Acquired: [2:01/22/2018] [N/A:N/A] Weeks of Treatment: [2:12] [N/A:N/A] Wound Status: [2:Open] [N/A:N/A] Measurements L x W x D [2:0.5x0.6x0.1] [N/A:N/A] (cm) Area (cm) : [2:0.236] [N/A:N/A] Volume (cm) : [2:0.024] [N/A:N/A] % Reduction in Area: [2:14.20%] [N/A:N/A] % Reduction in Volume: [2:11.10%] [N/A:N/A] Classification: [2:Grade 1] [N/A:N/A] Exudate Amount: [2:None Present] [N/A:N/A] Wound Margin: [2:Flat and Intact] [N/A:N/A] Granulation Amount: [2:None Present (0%)] [N/A:N/A] Necrotic Amount: [2:Large (67-100%)] [N/A:N/A] Exposed Structures: [2:Fat Layer (Subcutaneous Tissue) Exposed: Yes Fascia: No Tendon: No Muscle: No Joint: No Bone: No] [N/A:N/A] Epithelialization: [2:None] [N/A:N/A] Periwound Skin Texture: [2:Scarring: Yes Excoriation: No Induration: No Callus: No Crepitus: No Rash: No] [N/A:N/A] Periwound Skin Moisture: [2:Dry/Scaly: Yes Maceration: No] [N/A:N/A] Periwound Skin Color: Atrophie Blanche: No N/A N/A Cyanosis: No Ecchymosis: No Erythema: No Hemosiderin Staining: No Mottled: No Pallor: No Rubor: No Temperature: No Abnormality N/A N/A Tenderness on Palpation: Yes N/A N/A Wound Preparation: Ulcer Cleansing:  N/A N/A Rinsed/Irrigated with Saline Topical Anesthetic Applied: Other: lidocaine 4% Treatment Notes Wound #2 (Right Calcaneus) Notes medihoney and large coverlet Electronic Signature(s) Signed: 05/07/2018 6:19:03 PM By: Linton Ham MD Entered By: Linton Ham on 05/07/2018 12:03:24 James Moreno ([id number]) -------------------------------------------------------------------------------- [place] Details Patient Name: James Moreno, James Moreno. Date of Service: 05/07/2018 10:30 AM Medical Record Number: [id number] Patient Account Number: [id number] Date of Birth/Sex: 09-Mar-1966 (53 y.o. M) Treating RN: Cornell Barman Primary Care Kyrah Schiro: Lelon Huh Other Clinician: Referring Jamyla Ard: Lelon Huh Treating James Moreno/Extender: Tito Dine in Treatment: 12 Active Inactive Soft Tissue Infection Nursing Diagnoses: Potential for infection: soft tissue Goals: Signs and symptoms of infection will be recognized early to allow for prompt treatment Date Initiated: 02/19/2018 Target Resolution Date: 03/26/2018 Goal Status: Active Interventions: Assess signs and symptoms of infection every visit Treatment Activities: Culture and sensitivity : 02/19/2018 Systemic antibiotics : 02/19/2018 Notes: Wound/Skin Impairment Nursing Diagnoses: Impaired tissue integrity Knowledge deficit related to smoking impact on wound healing Knowledge deficit related to ulceration/compromised skin integrity Goals: Patient/caregiver will verbalize understanding of skin care regimen Date Initiated: 02/12/2018 Target Resolution Date: 03/14/2018 Goal Status: Active Ulcer/skin breakdown will have a volume reduction of 30% by week 4 Date Initiated: 02/12/2018 Target Resolution Date: 03/14/2018 Goal Status: Active Interventions: Assess patient/caregiver ability to obtain necessary supplies Assess patient/caregiver ability to perform ulcer/skin care regimen upon admission  and as needed Notes: Electronic Signature(s) James Moreno, James Moreno ([id number]) Signed: 05/08/2018 8:43:41 AM By: Gretta Cool, BSN, RN, CWS, Kim RN, BSN Entered By: Gretta Cool, BSN, RN, CWS, Kim on 05/07/2018 11:35:48 James Moreno ([id number]) -------------------------------------------------------------------------------- Pain Assessment Details Patient Name: James Moreno, James Moreno. Date of Service: 05/07/2018 10:30 AM Medical Record Number: [id number] Patient Account Number: [id number] Date of Birth/Sex: April 13, 1965 (53 y.o. M) Treating RN: Cornell Barman Primary Care Halla Chopp: Lelon Huh Other Clinician: Referring Lynette Noah: Lelon Huh Treating Danetra Glock/Extender: Tito Dine in Treatment: 12 Active Problems Location of Pain Severity and Description of Pain Patient Has Paino No Site Locations Pain Management and Medication Current Pain Management: Electronic Signature(s) Signed: 05/07/2018 3:41:08 PM By: Lorine Bears RCP, RRT, CHT Signed: 05/08/2018 8:43:41 AM By: Gretta Cool, BSN, RN, CWS, Kim RN, BSN Entered By: Lorine Bears on 05/07/2018 10:34:17 James Moreno ([id number]) -------------------------------------------------------------------------------- Patient/Caregiver Education Details Patient Name: James Moreno, James Moreno. Date of Service: 05/07/2018 10:30 AM Medical Record Number: [id number] Patient Account Number: [id number] Date of Birth/Gender: 1965/11/28 (53 y.o. M) Treating RN: Cornell Barman Primary Care Physician: Lelon Huh Other Clinician: Referring Physician:  Lelon Huh Treating Physician/Extender: Tito Dine in Treatment: 12 Education Assessment Education Provided To: Patient Education Topics Provided Wound/Skin Impairment: Handouts: Caring for Your Ulcer Methods: Demonstration, Explain/Verbal Responses: State content correctly Electronic Signature(s) Signed: 05/08/2018 8:43:41 AM By: Gretta Cool, BSN, RN, CWS, Kim RN,  BSN Entered By: Gretta Cool, BSN, RN, CWS, Kim on 05/07/2018 11:43:03 James Moreno ([id number]) -------------------------------------------------------------------------------- Wound Assessment Details Patient Name: James Moreno, James Moreno. Date of Service: 05/07/2018 10:30 AM Medical Record Number: [id number] Patient Account Number: [id number] Date of Birth/Sex: [birth year] (53 y.o. M) Treating RN: Army Melia Primary Care Hassel Uphoff: Lelon Huh Other Clinician: Referring Callaway Hailes: Lelon Huh Treating Jasean Ambrosia/Extender: Tito Dine in Treatment: 12 Wound Status Wound Number: 2 Primary Etiology: Diabetic Wound/Ulcer of the Lower Extremity Wound Location: Right Calcaneus Wound Status: Open Wounding Event: Gradually Appeared Comorbid Hypertension, Hepatitis B, Type II Diabetes Date Acquired: 01/22/2018 History: Weeks Of Treatment: 12 Clustered Wound: No Photos Photo Uploaded By: Army Melia on 05/08/2018 08:45:15 Wound Measurements Length: (cm) 0.5 Width: (cm) 0.6 Depth: (cm) 0.1 Area: (cm) 0.236 Volume: (cm) 0.024 % Reduction in Area: 14.2% % Reduction in Volume: 11.1% Epithelialization: None Tunneling: No Undermining: No Wound Description Classification: Grade 1 Wound Margin: Flat and Intact Exudate Amount: None Present Foul Odor After Cleansing: No Slough/Fibrino Yes Wound Bed Granulation Amount: None Present (0%) Exposed Structure Necrotic Amount: Large (67-100%) Fascia Exposed: No Necrotic Quality: Adherent Slough Fat Layer (Subcutaneous Tissue) Exposed: Yes Tendon Exposed: No Muscle Exposed: No Joint Exposed: No Bone Exposed: No Periwound Skin Texture Texture Color No Abnormalities Noted: No No Abnormalities Noted: No AIDDEN, MARKOVIC. ([id number]) Callus: No Atrophie Blanche: No Crepitus: No Cyanosis: No Excoriation: No Ecchymosis: No Induration: No Erythema: No Rash: No Hemosiderin Staining: No Scarring: Yes Mottled: No Pallor:  No Moisture Rubor: No No Abnormalities Noted: No Dry / Scaly: Yes Temperature / Pain Maceration: No Temperature: No Abnormality Tenderness on Palpation: Yes Wound Preparation Ulcer Cleansing: Rinsed/Irrigated with Saline Topical Anesthetic Applied: Other: lidocaine 4%, Treatment Notes Wound #2 (Right Calcaneus) Notes medihoney and large coverlet Electronic Signature(s) Signed: 05/12/2018 8:46:05 AM By: Army Melia Entered By: Army Melia on 05/07/2018 10:40:19 James Moreno ([id number]) -------------------------------------------------------------------------------- Vitals Details Patient Name: James Moreno. Date of Service: 05/07/2018 10:30 AM Medical Record Number: [id number] Patient Account Number: [id number] Date of Birth/Sex: 11/02/1965 (53 y.o. M) Treating RN: Cornell Barman Primary Care Kristell Wooding: Lelon Huh Other Clinician: Referring Deniya Craigo: Lelon Huh Treating Micheil Klaus/Extender: Tito Dine in Treatment: 12 Vital Signs Time Taken: 10:34 Temperature (F): 98.2 Height (in): 71 Pulse (bpm): 85 Weight (lbs): 238 Respiratory Rate (breaths/min): 16 Body Mass Index (BMI): 33.2 Blood Pressure (mmHg): 152/70 Reference Range: 80 - 120 mg / dl Airway Electronic Signature(s) Signed: 05/07/2018 3:41:08 PM By: Lorine Bears RCP, RRT, CHT Entered By: Lorine Bears on 05/07/2018 10:37:32

## 2018-05-21 DIAGNOSIS — Z940 Kidney transplant status: Secondary | ICD-10-CM | POA: Diagnosis not present

## 2018-05-21 DIAGNOSIS — Z6833 Body mass index (BMI) 33.0-33.9, adult: Secondary | ICD-10-CM | POA: Diagnosis not present

## 2018-05-21 DIAGNOSIS — R768 Other specified abnormal immunological findings in serum: Secondary | ICD-10-CM | POA: Diagnosis not present

## 2018-05-21 DIAGNOSIS — I10 Essential (primary) hypertension: Secondary | ICD-10-CM | POA: Diagnosis not present

## 2018-05-21 DIAGNOSIS — Z720 Tobacco use: Secondary | ICD-10-CM | POA: Diagnosis not present

## 2018-05-21 DIAGNOSIS — N183 Chronic kidney disease, stage 3 (moderate): Secondary | ICD-10-CM | POA: Diagnosis not present

## 2018-05-21 DIAGNOSIS — K219 Gastro-esophageal reflux disease without esophagitis: Secondary | ICD-10-CM | POA: Diagnosis not present

## 2018-05-21 DIAGNOSIS — E119 Type 2 diabetes mellitus without complications: Secondary | ICD-10-CM | POA: Diagnosis not present

## 2018-05-26 ENCOUNTER — Other Ambulatory Visit: Payer: Self-pay | Admitting: Family Medicine

## 2018-05-26 DIAGNOSIS — R52 Pain, unspecified: Secondary | ICD-10-CM

## 2018-05-26 DIAGNOSIS — L905 Scar conditions and fibrosis of skin: Secondary | ICD-10-CM

## 2018-05-26 DIAGNOSIS — R768 Other specified abnormal immunological findings in serum: Secondary | ICD-10-CM | POA: Insufficient documentation

## 2018-05-26 DIAGNOSIS — M79604 Pain in right leg: Secondary | ICD-10-CM

## 2018-05-26 DIAGNOSIS — K219 Gastro-esophageal reflux disease without esophagitis: Secondary | ICD-10-CM | POA: Insufficient documentation

## 2018-05-26 NOTE — Telephone Encounter (Signed)
Needs refill on his oxycodone 5 mg  [facility] [address]  CB#  [phone]  Thanks teri

## 2018-05-28 ENCOUNTER — Ambulatory Visit: Payer: Medicare Other | Admitting: Internal Medicine

## 2018-05-29 NOTE — Telephone Encounter (Signed)
Patient called back. I advised him as below. Patient verified that her picked up prescription on 05/27/2018.

## 2018-05-29 NOTE — Telephone Encounter (Signed)
Please check with [facility] [address] regarding dispense history of oxycodone 5mg  tablets. PDMP reports that 240 tablets were  Dispensed on 05-27-2018.

## 2018-05-29 NOTE — Telephone Encounter (Signed)
Called [facility] the pharmacist states that the prescription was picked up 05/27/2018.  Left message for pt to call back.   Thanks,   -Mickel Baas

## 2018-06-02 NOTE — Progress Notes (Signed)
Moreno, James ([id number]) Visit Report for 04/23/2018 Arrival Information Details Patient Name: James Moreno, James Moreno. Date of Service: 04/23/2018 10:30 AM Medical Record Number: [id number] Patient Account Number: [id number] Date of Birth/Sex: 02/24/1966 (53 y.o. M) Treating RN: Cornell Barman Primary Care Kenniel Bergsma: Lelon Huh Other Clinician: Referring Deyonte Cadden: Lelon Huh Treating Retina Bernardy/Extender: Tito Dine in Treatment: 10 Visit Information History Since Last Visit Added or deleted any medications: No Patient Arrived: Ambulatory Any new allergies or adverse reactions: No Arrival Time: 10:45 Had a fall or experienced change in No Accompanied By: self activities of daily living that may affect Transfer Assistance: None risk of falls: Patient Identification Verified: Yes Signs or symptoms of abuse/neglect since last visito No Secondary Verification Process Yes Hospitalized since last visit: No Completed: Implantable device outside of the clinic excluding No Patient Has Alerts: Yes cellular tissue based products placed in the center Patient Alerts: ABI 10/31/17 L 1.06 R since last visit: 1.37 Has Dressing in Place as Prescribed: Yes TBI L .3 R .48 Pain Present Now: No Electronic Signature(s) Signed: 04/23/2018 11:46:38 AM By: Lorine Bears RCP, RRT, CHT Entered By: Lorine Bears on 04/23/2018 10:45:43 James Moreno ([id number]) -------------------------------------------------------------------------------- Clinic Level of Care Assessment Details Patient Name: James Moreno. Date of Service: 04/23/2018 10:30 AM Medical Record Number: [id number] Patient Account Number: [id number] Date of Birth/Sex: 1966/02/17 (53 y.o. M) Treating RN: Cornell Barman Primary Care Anndrea Mihelich: Lelon Huh Other Clinician: Referring Gedalia Mcmillon: Lelon Huh Treating Kaijah Abts/Extender: Tito Dine in Treatment: 10 Clinic Level of Care  Assessment Items TOOL 4 Quantity Score []  - Use when only an EandM is performed on FOLLOW-UP visit 0 ASSESSMENTS - Nursing Assessment / Reassessment X - Reassessment of Co-morbidities (includes updates in patient status) 1 10 X- 1 5 Reassessment of Adherence to Treatment Plan ASSESSMENTS - Wound and Skin Assessment / Reassessment X - Simple Wound Assessment / Reassessment - one wound 1 5 []  - 0 Complex Wound Assessment / Reassessment - multiple wounds []  - 0 Dermatologic / Skin Assessment (not related to wound area) ASSESSMENTS - Focused Assessment []  - Circumferential Edema Measurements - multi extremities 0 []  - 0 Nutritional Assessment / Counseling / Intervention []  - 0 Lower Extremity Assessment (monofilament, tuning fork, pulses) []  - 0 Peripheral Arterial Disease Assessment (using hand held doppler) ASSESSMENTS - Ostomy and/or Continence Assessment and Care []  - Incontinence Assessment and Management 0 []  - 0 Ostomy Care Assessment and Management (repouching, etc.) PROCESS - Coordination of Care X - Simple Patient / Family Education for ongoing care 1 15 []  - 0 Complex (extensive) Patient / Family Education for ongoing care []  - 0 Staff obtains Programmer, systems, Records, Test Results / Process Orders []  - 0 Staff telephones HHA, Nursing Homes / Clarify orders / etc []  - 0 Routine Transfer to another Facility (non-emergent condition) []  - 0 Routine Hospital Admission (non-emergent condition) []  - 0 New Admissions / Biomedical engineer / Ordering NPWT, Apligraf, etc. []  - 0 Emergency Hospital Admission (emergent condition) X- 1 10 Simple Discharge Coordination TAYTON, DECAIRE. ([id number]) []  - 0 Complex (extensive) Discharge Coordination PROCESS - Special Needs []  - Pediatric / Minor Patient Management 0 []  - 0 Isolation Patient Management []  - 0 Hearing / Language / Visual special needs []  - 0 Assessment of Community assistance (transportation, D/C planning,  etc.) []  - 0 Additional assistance / Altered mentation []  - 0 Support Surface(s) Assessment (bed, cushion, seat, etc.) INTERVENTIONS - Wound Cleansing / Measurement X -  Simple Wound Cleansing - one wound 1 5 []  - 0 Complex Wound Cleansing - multiple wounds X- 1 5 Wound Imaging (photographs - any number of wounds) []  - 0 Wound Tracing (instead of photographs) X- 1 5 Simple Wound Measurement - one wound []  - 0 Complex Wound Measurement - multiple wounds INTERVENTIONS - Wound Dressings []  - Small Wound Dressing one or multiple wounds 0 X- 1 15 Medium Wound Dressing one or multiple wounds []  - 0 Large Wound Dressing one or multiple wounds []  - 0 Application of Medications - topical []  - 0 Application of Medications - injection INTERVENTIONS - Miscellaneous []  - External ear exam 0 []  - 0 Specimen Collection (cultures, biopsies, blood, body fluids, etc.) []  - 0 Specimen(s) / Culture(s) sent or taken to Lab for analysis []  - 0 Patient Transfer (multiple staff / Civil Service fast streamer / Similar devices) []  - 0 Simple Staple / Suture removal (25 or less) []  - 0 Complex Staple / Suture removal (26 or more) []  - 0 Hypo / Hyperglycemic Management (close monitor of Blood Glucose) []  - 0 Ankle / Brachial Index (ABI) - do not check if billed separately X- 1 5 Vital Signs SADIEL, MOTA. ([id number]) Has the patient been seen at the hospital within the last three years: Yes Total Score: 80 Level Of Care: New/Established - Level 3 Electronic Signature(s) Signed: 04/23/2018 6:00:27 PM By: Gretta Cool, BSN, RN, CWS, Kim RN, BSN Entered By: Gretta Cool, BSN, RN, CWS, Kim on 04/23/2018 11:06:45 James Moreno ([id number]) -------------------------------------------------------------------------------- Encounter Discharge Information Details Patient Name: James, Moreno. Date of Service: 04/23/2018 10:30 AM Medical Record Number: [id number] Patient Account Number: [id number] Date of Birth/Sex:  October 10, 1965 (53 y.o. M) Treating RN: Cornell Barman Primary Care Nickola Lenig: Lelon Huh Other Clinician: Referring Arieana Somoza: Lelon Huh Treating Obediah Welles/Extender: Tito Dine in Treatment: 10 Encounter Discharge Information Items Discharge Condition: Stable Ambulatory Status: Ambulatory Discharge Destination: Home Transportation: Private Auto Accompanied By: self Schedule Follow-up Appointment: Yes Clinical Summary of Care: Electronic Signature(s) Signed: 04/23/2018 11:11:59 AM By: Gretta Cool, BSN, RN, CWS, Kim RN, BSN Entered By: Gretta Cool, BSN, RN, CWS, Kim on 04/23/2018 11:11:59 James Moreno ([id number]) -------------------------------------------------------------------------------- Lower Extremity Assessment Details Patient Name: ANDER, WAMSER. Date of Service: 04/23/2018 10:30 AM Medical Record Number: [id number] Patient Account Number: [id number] Date of Birth/Sex: Aug 18, 1965 (53 y.o. M) Treating RN: Harold Barban Primary Care Francies Inch: Lelon Huh Other Clinician: Referring Dannon Nguyenthi: Lelon Huh Treating Raeley Gilmore/Extender: Tito Dine in Treatment: 10 Edema Assessment Assessed: [Left: No] [Right: No] [Left: Edema] [Right: :] Calf Left: Right: Point of Measurement: 34 cm From Medial Instep cm 38 cm Ankle Left: Right: Point of Measurement: 12 cm From Medial Instep cm 22 cm Vascular Assessment Claudication: Claudication Assessment [Right:None] Pulses: Dorsalis Pedis Palpable: [Right:Yes] Posterior Tibial Palpable: [Right:Yes] Extremity colors, hair growth, and conditions: Hair Growth on Extremity: [Right:Yes] Temperature of Extremity: [Right:Warm] Capillary Refill: [Right:< 3 seconds] Toe Nail Assessment Left: Right: Thick: No Discolored: No Deformed: No Improper Length and Hygiene: No Electronic Signature(s) Signed: 06/02/2018 10:53:34 AM By: Harold Barban Entered By: Harold Barban on 04/23/2018 10:53:49 James Moreno  ([id number]) -------------------------------------------------------------------------------- Multi Wound Chart Details Patient Name: James Moreno. Date of Service: 04/23/2018 10:30 AM Medical Record Number: [id number] Patient Account Number: [id number] Date of Birth/Sex: May 24, 1965 (53 y.o. M) Treating RN: Cornell Barman Primary Care Yousra Ivens: Lelon Huh Other Clinician: Referring Malayzia Laforte: Lelon Huh Treating Emmanuela Ghazi/Extender: Ricard Dillon Weeks in Treatment: 10 Vital Signs Height(in): 71 Pulse(bpm): 91 Weight(lbs): 354  Blood Pressure(mmHg): 163/81 Body Mass Index(BMI): 33 Temperature(F): 97.6 Respiratory Rate 16 (breaths/min): Photos: [2:No Photos] [N/A:N/A] Wound Location: [2:Right Calcaneus] [N/A:N/A] Wounding Event: [2:Gradually Appeared] [N/A:N/A] Primary Etiology: [2:Diabetic Wound/Ulcer of the Lower Extremity] [N/A:N/A] Comorbid History: [2:Hypertension, Hepatitis B, Type II Diabetes] [N/A:N/A] Date Acquired: [2:01/22/2018] [N/A:N/A] Weeks of Treatment: [2:10] [N/A:N/A] Wound Status: [2:Open] [N/A:N/A] Measurements L x W x D [2:0.5x0.5x0.1] [N/A:N/A] (cm) Area (cm) : [2:0.196] [N/A:N/A] Volume (cm) : [2:0.02] [N/A:N/A] % Reduction in Area: [2:28.70%] [N/A:N/A] % Reduction in Volume: [2:25.90%] [N/A:N/A] Classification: [2:Grade 1] [N/A:N/A] Exudate Amount: [2:None Present] [N/A:N/A] Wound Margin: [2:Flat and Intact] [N/A:N/A] Granulation Amount: [2:None Present (0%)] [N/A:N/A] Necrotic Amount: [2:Large (67-100%)] [N/A:N/A] Exposed Structures: [2:Fat Layer (Subcutaneous Tissue) Exposed: Yes Fascia: No Tendon: No Muscle: No Joint: No Bone: No] [N/A:N/A] Epithelialization: [2:None] [N/A:N/A] Periwound Skin Texture: [2:Scarring: Yes Excoriation: No Induration: No Callus: No Crepitus: No Rash: No] [N/A:N/A] Periwound Skin Moisture: [2:Dry/Scaly: Yes Maceration: No] [N/A:N/A] Periwound Skin Color: Atrophie Blanche: No N/A N/A Cyanosis:  No Ecchymosis: No Erythema: No Hemosiderin Staining: No Mottled: No Pallor: No Rubor: No Temperature: No Abnormality N/A N/A Tenderness on Palpation: Yes N/A N/A Wound Preparation: Ulcer Cleansing: N/A N/A Rinsed/Irrigated with Saline Topical Anesthetic Applied: Other: lidocaine 4% Treatment Notes Wound #2 (Right Calcaneus) Notes medihoney and large coverlet Electronic Signature(s) Signed: 04/24/2018 9:49:40 AM By: Linton Ham MD Entered By: Linton Ham on 04/23/2018 12:03:02 James Moreno ([id number]) -------------------------------------------------------------------------------- [place] Details Patient Name: RONEY, YOUTZ. Date of Service: 04/23/2018 10:30 AM Medical Record Number: [id number] Patient Account Number: [id number] Date of Birth/Sex: 10-Jul-1965 (53 y.o. M) Treating RN: Cornell Barman Primary Care Claritza July: Lelon Huh Other Clinician: Referring Rosezella Kronick: Lelon Huh Treating Travone Georg/Extender: Tito Dine in Treatment: 10 Active Inactive Soft Tissue Infection Nursing Diagnoses: Potential for infection: soft tissue Goals: Signs and symptoms of infection will be recognized early to allow for prompt treatment Date Initiated: 02/19/2018 Target Resolution Date: 03/26/2018 Goal Status: Active Interventions: Assess signs and symptoms of infection every visit Treatment Activities: Culture and sensitivity : 02/19/2018 Systemic antibiotics : 02/19/2018 Notes: Wound/Skin Impairment Nursing Diagnoses: Impaired tissue integrity Knowledge deficit related to smoking impact on wound healing Knowledge deficit related to ulceration/compromised skin integrity Goals: Patient/caregiver will verbalize understanding of skin care regimen Date Initiated: 02/12/2018 Target Resolution Date: 03/14/2018 Goal Status: Active Ulcer/skin breakdown will have a volume reduction of 30% by week 4 Date Initiated: 02/12/2018 Target  Resolution Date: 03/14/2018 Goal Status: Active Interventions: Assess patient/caregiver ability to obtain necessary supplies Assess patient/caregiver ability to perform ulcer/skin care regimen upon admission and as needed Notes: Electronic Signature(s) TABER, SWEETSER ([id number]) Signed: 04/23/2018 6:00:27 PM By: Gretta Cool, BSN, RN, CWS, Kim RN, BSN Entered By: Gretta Cool, BSN, RN, CWS, Kim on 04/23/2018 11:03:48 James Moreno ([id number]) -------------------------------------------------------------------------------- Pain Assessment Details Patient Name: LEAMAN, ABE. Date of Service: 04/23/2018 10:30 AM Medical Record Number: [id number] Patient Account Number: [id number] Date of Birth/Sex: Feb 09, 1966 (53 y.o. M) Treating RN: Cornell Barman Primary Care Elsie Sakuma: Lelon Huh Other Clinician: Referring Shelia Magallon: Lelon Huh Treating Yvon Mccord/Extender: Tito Dine in Treatment: 10 Active Problems Location of Pain Severity and Description of Pain Patient Has Paino No Site Locations Pain Management and Medication Current Pain Management: Electronic Signature(s) Signed: 04/23/2018 11:46:38 AM By: Lorine Bears RCP, RRT, CHT Signed: 04/23/2018 6:00:27 PM By: Gretta Cool, BSN, RN, CWS, Kim RN, BSN Entered By: Lorine Bears on 04/23/2018 10:45:50 James Moreno ([id number]) -------------------------------------------------------------------------------- Patient/Caregiver Education Details Patient Name: JAISE, MOSER. Date of  Service: 04/23/2018 10:30 AM Medical Record Number: [id number] Patient Account Number: [id number] Date of Birth/Gender: [birth year] (54 y.o. M) Treating RN: Cornell Barman Primary Care Physician: Lelon Huh Other Clinician: Referring Physician: Lelon Huh Treating Physician/Extender: Tito Dine in Treatment: 10 Education Assessment Education Provided To: Patient Education Topics Provided Wound/Skin  Impairment: Handouts: Caring for Your Ulcer, Other: continue wound care as prescribed Methods: Demonstration, Explain/Verbal Responses: State content correctly Electronic Signature(s) Signed: 04/23/2018 6:00:27 PM By: Gretta Cool, BSN, RN, CWS, Kim RN, BSN Entered By: Gretta Cool, BSN, RN, CWS, Kim on 04/23/2018 11:07:09 James Moreno ([id number]) -------------------------------------------------------------------------------- Wound Assessment Details Patient Name: REYNOLDO, MAINER. Date of Service: 04/23/2018 10:30 AM Medical Record Number: [id number] Patient Account Number: [id number] Date of Birth/Sex: 1965-12-17 (53 y.o. M) Treating RN: Harold Barban Primary Care Wanisha Shiroma: Lelon Huh Other Clinician: Referring An Lannan: Lelon Huh Treating Arliss Hepburn/Extender: Tito Dine in Treatment: 10 Wound Status Wound Number: 2 Primary Etiology: Diabetic Wound/Ulcer of the Lower Extremity Wound Location: Right Calcaneus Wound Status: Open Wounding Event: Gradually Appeared Comorbid Hypertension, Hepatitis B, Type II Diabetes Date Acquired: 01/22/2018 History: Weeks Of Treatment: 10 Clustered Wound: No Photos Photo Uploaded By: Harold Barban on 04/23/2018 17:26:30 Wound Measurements Length: (cm) 0.5 Width: (cm) 0.5 Depth: (cm) 0.1 Area: (cm) 0.196 Volume: (cm) 0.02 % Reduction in Area: 28.7% % Reduction in Volume: 25.9% Epithelialization: None Tunneling: No Undermining: No Wound Description Classification: Grade 1 Wound Margin: Flat and Intact Exudate Amount: None Present Foul Odor After Cleansing: No Slough/Fibrino Yes Wound Bed Granulation Amount: None Present (0%) Exposed Structure Necrotic Amount: Large (67-100%) Fascia Exposed: No Necrotic Quality: Adherent Slough Fat Layer (Subcutaneous Tissue) Exposed: Yes Tendon Exposed: No Muscle Exposed: No Joint Exposed: No Bone Exposed: No Periwound Skin Texture Texture Color No Abnormalities Noted:  No No Abnormalities Noted: No EHAB, HUMBER. ([id number]) Callus: No Atrophie Blanche: No Crepitus: No Cyanosis: No Excoriation: No Ecchymosis: No Induration: No Erythema: No Rash: No Hemosiderin Staining: No Scarring: Yes Mottled: No Pallor: No Moisture Rubor: No No Abnormalities Noted: No Dry / Scaly: Yes Temperature / Pain Maceration: No Temperature: No Abnormality Tenderness on Palpation: Yes Wound Preparation Ulcer Cleansing: Rinsed/Irrigated with Saline Topical Anesthetic Applied: Other: lidocaine 4%, Electronic Signature(s) Signed: 06/02/2018 10:53:34 AM By: Harold Barban Entered By: Harold Barban on 04/23/2018 10:52:57 James Moreno ([id number]) -------------------------------------------------------------------------------- Vitals Details Patient Name: James Moreno. Date of Service: 04/23/2018 10:30 AM Medical Record Number: [id number] Patient Account Number: [id number] Date of Birth/Sex: 05-Jan-1966 (53 y.o. M) Treating RN: Cornell Barman Primary Care Haidan Nhan: Lelon Huh Other Clinician: Referring Jonica Bickhart: Lelon Huh Treating Kris Burd/Extender: Tito Dine in Treatment: 10 Vital Signs Time Taken: 10:45 Temperature (F): 97.6 Height (in): 71 Pulse (bpm): 91 Weight (lbs): 238 Respiratory Rate (breaths/min): 16 Body Mass Index (BMI): 33.2 Blood Pressure (mmHg): 163/81 Reference Range: 80 - 120 mg / dl Electronic Signature(s) Signed: 04/23/2018 11:46:38 AM By: Lorine Bears RCP, RRT, CHT Entered By: Lorine Bears on 04/23/2018 10:48:15

## 2018-06-04 ENCOUNTER — Encounter: Payer: Medicare Other | Attending: Internal Medicine | Admitting: Internal Medicine

## 2018-06-04 ENCOUNTER — Other Ambulatory Visit: Payer: Self-pay

## 2018-06-04 DIAGNOSIS — S91301A Unspecified open wound, right foot, initial encounter: Secondary | ICD-10-CM | POA: Diagnosis not present

## 2018-06-04 DIAGNOSIS — Z8631 Personal history of diabetic foot ulcer: Secondary | ICD-10-CM | POA: Insufficient documentation

## 2018-06-04 DIAGNOSIS — E1151 Type 2 diabetes mellitus with diabetic peripheral angiopathy without gangrene: Secondary | ICD-10-CM | POA: Diagnosis not present

## 2018-06-04 DIAGNOSIS — N183 Chronic kidney disease, stage 3 (moderate): Secondary | ICD-10-CM | POA: Insufficient documentation

## 2018-06-04 DIAGNOSIS — E1122 Type 2 diabetes mellitus with diabetic chronic kidney disease: Secondary | ICD-10-CM | POA: Diagnosis not present

## 2018-06-04 DIAGNOSIS — Z940 Kidney transplant status: Secondary | ICD-10-CM | POA: Diagnosis not present

## 2018-06-04 DIAGNOSIS — Z09 Encounter for follow-up examination after completed treatment for conditions other than malignant neoplasm: Secondary | ICD-10-CM | POA: Diagnosis not present

## 2018-06-04 DIAGNOSIS — I129 Hypertensive chronic kidney disease with stage 1 through stage 4 chronic kidney disease, or unspecified chronic kidney disease: Secondary | ICD-10-CM | POA: Diagnosis not present

## 2018-06-05 NOTE — Progress Notes (Signed)
NAKHI, CHOI ([id number]) Visit Report for 06/04/2018 HPI Details Patient Name: James Moreno, James Moreno. Date of Service: 06/04/2018 1:45 PM Medical Record Number: [id number] Patient Account Number: [id number] Date of Birth/Sex: 1965/12/12 (53 y.o. M) Treating RN: Cornell Barman Primary Care Provider: Lelon Huh Other Clinician: Referring Provider: Lelon Huh Treating Provider/Extender: Tito Dine in Treatment: 16 History of Present Illness HPI Description: 10/24/17-He is seen in initial evaluation for a right posterior heel wound. He states approximately 2 months ago he sustained an injury while using a pumice/callus grater on his heels. In the time since that injury he has seen [facility], [facility], Dr. Cleda Mccreedy at [facility], [facility] or so, infectious disease, and urgent care. He is currently on no antibiotc therapy but has a history of being on keflex and bactrim (at time of injury) which was discontinued at presentation to [facility] ER (within a week after injury) and initiated on cipro and clindamycin. He did see infectious disease in [place] who recommended a CAT scan, he has not heard back about the CAT scan. He saw Dr. Cleda Mccreedy last week who referred him to [facility] for evaluation. he admits to not offloading, was never instructed to. He is on chronic immunosuppression secondary to a renal transplant 2016. His diabetes is controlled with an A1c of 7.4 last week. He does admit to pain, relieved without need for medication. He is smoking, with a plan to quit smoking cigarettes by next Monday with individual goal to decrease the amount of nicotine and his vape to 0%. He has been encouraged to expedite the smoking cessation. 10/31/17-He is seen in follow-up evaluation for right posterior heel wound. There is no deterioration, he remains pinpoint with scant amount of serous drainage. He was evaluated by [facility] yesterday (RIGHT:  ABI 1.37, TBI 0.48; LEFT: ABI 1.06, TBI 0.3) with plans for angioplasty next week. We will continue with same treatment plan and he will follow up in two weeks. He was strongly encouraged to have complete smoking cessation prior to or immediately after angioplasty to increase success. READMISSION 02/12/18 This is a 53 year old man who is a type II diabetic. He was here for 2 visits in the summer at which time he had an area on the right posterior tip of his heel. He states he was removing callus with some form of instrument caused this wound. He was also followed by Dr. Sharlotte Alamo podiatry at [facility]. He states this eventually healed over with Hydrofera Blue. The patient saw vascular surgery at [facility] on 9/16 at which time the wound was healed. He states 2 or 3 weeks ago he was removing callus from the side of his heel using topical antibiotics and he opened a new wound in this area. He states that this is very painful in fact he states the other heel wound was also painful and the pain was not completely relieved when this closed over. He has known PAD apparently saw [facility] in August and they were planning for an angioplasty although the patient went for a second opinion at [facility] by Dr. Tamera Punt long who did not feel he needed an intervention at that time. Plans were being made to follow this with follow-up arterial studies. He has not had any more recent arterial studies then were quoted in his note of 10/31/17. At that point he had noncompressible vessels on the right with an ABI of 1.37 and a TBI of 0.48. He  has been washing this wound with soap and water and covering with a Band-Aid Past medical history includes hypertension, type 2 diabetes with PAD, stage III chronic renal failure status post kidney transplant. 02/19/2018 patient seen today for follow-up of right ankle wound. He is a type II diabetic. He is followed by the vascular Center at [facility]. Recent  up x-ray obtained which was negative for any abnormalities. He is currently being treated with doxycycline status post a wound culture and support of sensitivity. This antibiotic was okay by his nephrology doctor as he is a kidney transplant in 2016. The wound does have surrounding erythema with mild edema and adherent slough at the wound bed of the site. He reports a lot of sensitivity to touch of the wound. No recent fever or chills. 03/05/2018 patient original wound culture revealed coag negative staph nevertheless this was treated with doxycycline with ULYSEE, FYOCK R. ([id number]) improvement. The patient states the pain is better he has been using Santyl. He came in today without his heel offloading sandal states he was never given a sandal. 03/12/2018; the patient arrives with more pain around the area on the right ankle. There is again surrounding erythema with some tenderness. As opposed to last week I was more convinced that the surface on this wound was not viable and therefore debridement. I do not see anything to culture here. In talking to the patient he probably has 40 yard claudication in both legs. He emphasizes to me today that this wound that we are treating currently occurred spontaneously i.e. not due to trauma like the last time he was here 03/25/18 Seen today for follow up and management of right heel wound. He reports ongoing tenderness of wound with touch. Smal layer of slough present. He reports compliance with drsg changes. He has not been wearing offloading shoe as recommended. No s/s of wound infection today. 1/15; the patient's appointment with vascular at [facility] was put off till early February. He saw orthopedic surgery who is put him on Medihoney and something called Blast X. I am not sure what the latter is at this point. I told him to continue with the Medihoney. I think the patient has recurrent wounds on his feet he is a diabetic and he has claudication. I suspect  he needs to see vascular surgery which would undoubtedly lead to a invasive study. Nevertheless I told him I think that is what needs to be done. He was offered this at the local vein and vascular and he went to [facility] that is where we are right now. 1/29; patient's appointment at [facility] is February 10. He is using a combination of blast X which is some form of ointment that is advertised is reducing bioburden and meta honey perhaps on an alternating day basis although it is hard to tell. Wound is on the heel on the right lateral aspect. Tightly adherent debris. Certainly less tender 2/12; patient appointment with the [facility] surgeon was 2 days ago although I cannot actually see his note. Apparently the plan is for him to come back to do that preparation for angiography at the end of March. He states his wound is a lot better using medihoney less pain 3/11 the patient is due for angiography shortly at [facility]. In the meantime the area on the right lateral heel is healed. Electronic Signature(s) Signed: 06/04/2018 5:42:07 PM By: Linton Ham MD Entered By: Linton Ham on 06/04/2018 14:48:15 [name] ([id number]) -------------------------------------------------------------------------------- Physical Exam Details Patient Name:  Moreno, James R. Date of Service: 06/04/2018 1:45 PM Medical Record Number: [id number] Patient Account Number: [id number] Date of Birth/Sex: October 29, 1965 (53 y.o. M) Treating RN: Cornell Barman Primary Care Provider: Lelon Huh Other Clinician: Referring Provider: Lelon Huh Treating Provider/Extender: Tito Dine in Treatment: 16 Cardiovascular Nonpalpable on the right. Notes Wound exam; the areas on the right lateral heel. There is no open area here. Some scaling skin but no need for debridement there is no open area. No evidence of infection Electronic Signature(s) Signed: 06/04/2018 5:42:07 PM By: Linton Ham MD Entered By: Linton Ham  on 06/04/2018 14:49:01 [name] ([id number]) -------------------------------------------------------------------------------- Physician Orders Details Patient Name: James, Moreno. Date of Service: 06/04/2018 1:45 PM Medical Record Number: [id number] Patient Account Number: [id number] Date of Birth/Sex: Dec 15, 1965 (53 y.o. M) Treating RN: Cornell Barman Primary Care Provider: Lelon Huh Other Clinician: Referring Provider: Lelon Huh Treating Provider/Extender: Tito Dine in Treatment: 53 Verbal / Phone Orders: No Diagnosis Coding Discharge From [facility] o Discharge from [place] - treatment complete Electronic Signature(s) Signed: 06/04/2018 5:23:21 PM By: Gretta Cool, BSN, RN, CWS, Kim RN, BSN Signed: 06/04/2018 5:42:07 PM By: Linton Ham MD Entered By: Gretta Cool, BSN, RN, CWS, Kim on 06/04/2018 14:02:49 James, Moreno ([id number]) -------------------------------------------------------------------------------- Problem List Details Patient Name: KANAAN, KAGAWA. Date of Service: 06/04/2018 1:45 PM Medical Record Number: [id number] Patient Account Number: [id number] Date of Birth/Sex: 05-15-1965 (53 y.o. M) Treating RN: Cornell Barman Primary Care Provider: Lelon Huh Other Clinician: Referring Provider: Lelon Huh Treating Provider/Extender: Tito Dine in Treatment: 16 Active Problems ICD-10 Evaluated Encounter Code Description Active Date Today Diagnosis E11.621 Type 2 diabetes mellitus with foot ulcer 02/12/2018 No Yes L97.411 Non-pressure chronic ulcer of right heel and midfoot limited 02/12/2018 No Yes to breakdown of skin E11.51 Type 2 diabetes mellitus with diabetic peripheral angiopathy 05/07/2018 No Yes without gangrene Inactive Problems Resolved Problems Electronic Signature(s) Signed: 06/04/2018 5:42:07 PM By: Linton Ham MD Entered By: Linton Ham on 06/04/2018 14:46:46 [name]  ([id number]) -------------------------------------------------------------------------------- Progress Note Details Patient Name: [name]. Date of Service: 06/04/2018 1:45 PM Medical Record Number: [id number] Patient Account Number: [id number] Date of Birth/Sex: [birth year] (53 y.o. M) Treating RN: Cornell Barman Primary Care Provider: Lelon Huh Other Clinician: Referring Provider: Lelon Huh Treating Provider/Extender: Tito Dine in Treatment: 16 Subjective History of Present Illness (HPI) 10/24/17-He is seen in initial evaluation for a right posterior heel wound. He states approximately 2 months ago he sustained an injury while using a pumice/callus grater on his heels. In the time since that injury he has seen [facility], [facility], Dr. Cleda Mccreedy at [facility], [facility], infectious disease, and urgent care. He is currently on no antibiotc therapy but has a history of being on keflex and bactrim (at time of injury) which was discontinued at presentation to [facility] ER (within a week after injury) and initiated on cipro and clindamycin. He did see infectious disease in [place] who recommended a CAT scan, he has not heard back about the CAT scan. He saw Dr. Cleda Mccreedy last week who referred him to [facility] for evaluation. he admits to not offloading, was never instructed to. He is on chronic immunosuppression secondary to a renal transplant 2016. His diabetes is controlled with an A1c of 7.4 last week. He does admit to pain, relieved without need for medication. He is smoking, with a plan to quit smoking cigarettes by next Monday with  individual goal to decrease the amount of nicotine and his vape to 0%. He has been encouraged to expedite the smoking cessation. 10/31/17-He is seen in follow-up evaluation for right posterior heel wound. There is no deterioration, he remains pinpoint with scant amount of serous drainage. He was evaluated by  [facility] yesterday (RIGHT: ABI 1.37, TBI 0.48; LEFT: ABI 1.06, TBI 0.3) with plans for angioplasty next week. We will continue with same treatment plan and he will follow up in two weeks. He was strongly encouraged to have complete smoking cessation prior to or immediately after angioplasty to increase success. READMISSION 02/12/18 This is a 53 year old man who is a type II diabetic. He was here for 2 visits in the summer at which time he had an area on the right posterior tip of his heel. He states he was removing callus with some form of instrument caused this wound. He was also followed by Dr. Sharlotte Alamo podiatry at [facility]. He states this eventually healed over with Hydrofera Blue. The patient saw vascular surgery at [facility] on 9/16 at which time the wound was healed. He states 2 or 3 weeks ago he was removing callus from the side of his heel using topical antibiotics and he opened a new wound in this area. He states that this is very painful in fact he states the other heel wound was also painful and the pain was not completely relieved when this closed over. He has known PAD apparently saw [facility] in August and they were planning for an angioplasty although the patient went for a second opinion at [facility] by Dr. Tamera Punt long who did not feel he needed an intervention at that time. Plans were being made to follow this with follow-up arterial studies. He has not had any more recent arterial studies then were quoted in his note of 10/31/17. At that point he had noncompressible vessels on the right with an ABI of 1.37 and a TBI of 0.48. He has been washing this wound with soap and water and covering with a Band-Aid Past medical history includes hypertension, type 2 diabetes with PAD, stage III chronic renal failure status post kidney transplant. 02/19/2018 patient seen today for follow-up of right ankle wound. He is a type II diabetic. He is followed by the  vascular Center at [facility]. Recent up x-ray obtained which was negative for any abnormalities. He is currently being treated with doxycycline status post a wound culture and support of sensitivity. This antibiotic was okay by his nephrology doctor as he is a kidney transplant in 2016. The wound does have surrounding erythema with mild edema and adherent slough at the wound bed of the site. He reports a lot of sensitivity to touch of the wound. No recent fever or chills. 03/05/2018 patient original wound culture revealed coag negative staph nevertheless this was treated with doxycycline with improvement. The patient states the pain is better he has been using Santyl. He came in today without his heel offloading sandal states he was never given a sandal. 03/12/2018; the patient arrives with more pain around the area on the right ankle. There is again surrounding erythema with James Moreno, James R. ([id number]) some tenderness. As opposed to last week I was more convinced that the surface on this wound was not viable and therefore debridement. I do not see anything to culture here. In talking to the patient he probably has 40 yard claudication in both legs. He emphasizes to me today  that this wound that we are treating currently occurred spontaneously i.e. not due to trauma like the last time he was here 03/25/18 Seen today for follow up and management of right heel wound. He reports ongoing tenderness of wound with touch. Smal layer of slough present. He reports compliance with drsg changes. He has not been wearing offloading shoe as recommended. No s/s of wound infection today. 1/15; the patient's appointment with vascular at [facility] was put off till early February. He saw orthopedic surgery who is put him on Medihoney and something called Blast X. I am not sure what the latter is at this point. I told him to continue with the Medihoney. I think the patient has recurrent wounds on his feet he is a  diabetic and he has claudication. I suspect he needs to see vascular surgery which would undoubtedly lead to a invasive study. Nevertheless I told him I think that is what needs to be done. He was offered this at the local vein and vascular and he went to [facility] that is where we are right now. 1/29; patient's appointment at [facility] is February 10. He is using a combination of blast X which is some form of ointment that is advertised is reducing bioburden and meta honey perhaps on an alternating day basis although it is hard to tell. Wound is on the heel on the right lateral aspect. Tightly adherent debris. Certainly less tender 2/12; patient appointment with the [place] surgeon was 2 days ago although I cannot actually see his note. Apparently the plan is for him to come back to do that preparation for angiography at the end of March. He states his wound is a lot better using medihoney less pain 3/11 the patient is due for angiography shortly at [facility]. In the meantime the area on the right lateral heel is healed. Objective Constitutional Vitals Time Taken: 2:01 PM, Height: 71 in, Weight: 238 lbs, BMI: 33.2. General Notes: Pt refused to have vitals taken Cardiovascular Nonpalpable on the right. General Notes: Wound exam; the areas on the right lateral heel. There is no open area here. Some scaling skin but no need for debridement there is no open area. No evidence of infection Integumentary (Hair, Skin) Wound #2 status is Healed - Epithelialized. Original cause of wound was Gradually Appeared. The wound is located on the Right Calcaneus. The wound measures 0cm length x 0cm width x 0cm depth; 0cm^2 area and 0cm^3 volume. There is Fat Layer (Subcutaneous Tissue) Exposed exposed. There is no tunneling or undermining noted. There is a none present amount of drainage noted. The wound margin is flat and intact. There is no granulation within the wound bed. There is a medium (34- 66%) amount of necrotic tissue  within the wound bed including Eschar. The periwound skin appearance exhibited: Callus, Scarring, Dry/Scaly. The periwound skin appearance did not exhibit: Crepitus, Excoriation, Induration, Rash, Maceration, Atrophie Blanche, Cyanosis, Ecchymosis, Hemosiderin Staining, Mottled, Pallor, Rubor, Erythema. Periwound temperature was noted as No Abnormality. The periwound has tenderness on palpation. James Moreno, James Moreno ([id number]) Assessment Active Problems ICD-10 Type 2 diabetes mellitus with foot ulcer Non-pressure chronic ulcer of right heel and midfoot limited to breakdown of skin Type 2 diabetes mellitus with diabetic peripheral angiopathy without gangrene Plan Discharge From [facility] Services: Discharge from [place] - treatment complete 1. The patient to be discharged from the wound care clinic 2. He is soon to go for an angiogram at [facility]. Apparently he is having noninvasive tests as well.  He has activity limiting claudication. 3. His wound has closed over Electronic Signature(s) Signed: 06/04/2018 5:42:07 PM By: Linton Ham MD Entered By: Linton Ham on 06/04/2018 14:50:39 [name] ([id number]) -------------------------------------------------------------------------------- [place] Details Patient Name: [name]. Date of Service: 06/04/2018 Medical Record Number: [id number] Patient Account Number: [id number] Date of Birth/Sex: 1965-09-10 (53 y.o. M) Treating RN: Cornell Barman Primary Care Provider: Lelon Huh Other Clinician: Referring Provider: Lelon Huh Treating Provider/Extender: Tito Dine in Treatment: 16 Diagnosis Coding ICD-10 Codes Code Description E11.621 Type 2 diabetes mellitus with foot ulcer L97.411 Non-pressure chronic ulcer of right heel and midfoot limited to breakdown of skin E11.51 Type 2 diabetes mellitus with diabetic peripheral angiopathy without gangrene Facility Procedures CPT4 Code: [id number] Description:  [phone] - WOUND CARE VISIT-LEV 2 EST PT Modifier: Quantity: 1 Physician Procedures CPT4 Code Description: [id number] - WC PHYS LEVEL 2 - EST PT ICD-10 Diagnosis Description E11.621 Type 2 diabetes mellitus with foot ulcer L97.411 Non-pressure chronic ulcer of right heel and midfoot limited E11.51 Type 2 diabetes mellitus with  diabetic peripheral angiopathy Modifier: to breakdown of without gangrene Quantity: 1 skin Electronic Signature(s) Signed: 06/04/2018 5:42:07 PM By: Linton Ham MD Entered By: Linton Ham on 06/04/2018 14:50:57

## 2018-06-05 NOTE — Progress Notes (Addendum)
SAHMIR, WEATHERBEE ([id number]) Visit Report for 06/04/2018 Arrival Information Details Patient Name: James Moreno, James Moreno. Date of Service: 06/04/2018 1:45 PM Medical Record Number: [id number] Patient Account Number: [id number] Date of Birth/Sex: 1965/10/19 (53 y.o. M) Treating RN: Army Melia Primary Care Kalim Kissel: Lelon Huh Other Clinician: Referring Theoplis Garciagarcia: Lelon Huh Treating Elienai Gailey/Extender: Tito Dine in Treatment: 16 Visit Information History Since Last Visit Added or deleted any medications: No Patient Arrived: Ambulatory Any new allergies or adverse reactions: No Arrival Time: 13:54 Had a fall or experienced change in No Accompanied By: self activities of daily living that may affect Transfer Assistance: None risk of falls: Patient Has Alerts: Yes Signs or symptoms of abuse/neglect since last visito No Patient Alerts: ABI 10/31/17 L 1.06 R 1.37 Hospitalized since last visit: No TBI L .3 R .48 Pain Present Now: No Electronic Signature(s) Signed: 06/04/2018 3:02:20 PM By: Army Melia Entered By: Army Melia on 06/04/2018 13:55:16 James Moreno ([id number]) -------------------------------------------------------------------------------- Clinic Level of Care Assessment Details Patient Name: James Moreno. Date of Service: 06/04/2018 1:45 PM Medical Record Number: [id number] Patient Account Number: [id number] Date of Birth/Sex: 04-Mar-1966 (53 y.o. M) Treating RN: Cornell Barman Primary Care Raegan Sipp: Lelon Huh Other Clinician: Referring Chequita Mofield: Lelon Huh Treating Chaise Passarella/Extender: Tito Dine in Treatment: 16 Clinic Level of Care Assessment Items TOOL 4 Quantity Score []  - Use when only an EandM is performed on FOLLOW-UP visit 0 ASSESSMENTS - Nursing Assessment / Reassessment []  - Reassessment of Co-morbidities (includes updates in patient status) 0 X- 1 5 Reassessment of Adherence to Treatment Plan ASSESSMENTS - Wound  and Skin Assessment / Reassessment X - Simple Wound Assessment / Reassessment - one wound 1 5 []  - 0 Complex Wound Assessment / Reassessment - multiple wounds []  - 0 Dermatologic / Skin Assessment (not related to wound area) ASSESSMENTS - Focused Assessment []  - Circumferential Edema Measurements - multi extremities 0 []  - 0 Nutritional Assessment / Counseling / Intervention []  - 0 Lower Extremity Assessment (monofilament, tuning fork, pulses) []  - 0 Peripheral Arterial Disease Assessment (using hand held doppler) ASSESSMENTS - Ostomy and/or Continence Assessment and Care []  - Incontinence Assessment and Management 0 []  - 0 Ostomy Care Assessment and Management (repouching, etc.) PROCESS - Coordination of Care X - Simple Patient / Family Education for ongoing care 1 15 []  - 0 Complex (extensive) Patient / Family Education for ongoing care []  - 0 Staff obtains Programmer, systems, Records, Test Results / Process Orders []  - 0 Staff telephones HHA, Nursing Homes / Clarify orders / etc []  - 0 Routine Transfer to another Facility (non-emergent condition) []  - 0 Routine Hospital Admission (non-emergent condition) []  - 0 New Admissions / Biomedical engineer / Ordering NPWT, Apligraf, etc. []  - 0 Emergency Hospital Admission (emergent condition) X- 1 10 Simple Discharge Coordination MYLZ, YUAN. ([id number]) []  - 0 Complex (extensive) Discharge Coordination PROCESS - Special Needs []  - Pediatric / Minor Patient Management 0 []  - 0 Isolation Patient Management []  - 0 Hearing / Language / Visual special needs []  - 0 Assessment of Community assistance (transportation, D/C planning, etc.) []  - 0 Additional assistance / Altered mentation []  - 0 Support Surface(s) Assessment (bed, cushion, seat, etc.) INTERVENTIONS - Wound Cleansing / Measurement []  - Simple Wound Cleansing - one wound 0 []  - 0 Complex Wound Cleansing - multiple wounds X- 1 5 Wound Imaging (photographs - any  number of wounds) []  - 0 Wound Tracing (instead of photographs) []  - 0 Simple Wound Measurement -  one wound []  - 0 Complex Wound Measurement - multiple wounds INTERVENTIONS - Wound Dressings []  - Small Wound Dressing one or multiple wounds 0 []  - 0 Medium Wound Dressing one or multiple wounds []  - 0 Large Wound Dressing one or multiple wounds []  - 0 Application of Medications - topical []  - 0 Application of Medications - injection INTERVENTIONS - Miscellaneous []  - External ear exam 0 []  - 0 Specimen Collection (cultures, biopsies, blood, body fluids, etc.) []  - 0 Specimen(s) / Culture(s) sent or taken to Lab for analysis []  - 0 Patient Transfer (multiple staff / Civil Service fast streamer / Similar devices) []  - 0 Simple Staple / Suture removal (25 or less) []  - 0 Complex Staple / Suture removal (26 or more) []  - 0 Hypo / Hyperglycemic Management (close monitor of Blood Glucose) []  - 0 Ankle / Brachial Index (ABI) - do not check if billed separately X- 1 5 Vital Signs ORVEL, CUTSFORTH. ([id number]) Has the patient been seen at the hospital within the last three years: Yes Total Score: 45 Level Of Care: New/Established - Level 2 Electronic Signature(s) Signed: 06/04/2018 5:23:21 PM By: Gretta Cool, BSN, RN, CWS, Kim RN, BSN Entered By: Gretta Cool, BSN, RN, CWS, Kim on 06/04/2018 14:03:15 James Moreno ([id number]) -------------------------------------------------------------------------------- Encounter Discharge Information Details Patient Name: James Moreno. Date of Service: 06/04/2018 1:45 PM Medical Record Number: [id number] Patient Account Number: [id number] Date of Birth/Sex: [birth year] (53 y.o. M) Treating RN: Cornell Barman Primary Care Rudolfo Brandow: Lelon Huh Other Clinician: Referring Talis Iwan: Lelon Huh Treating Lakendria Nicastro/Extender: Tito Dine in Treatment: 59 Encounter Discharge Information Items Discharge Condition: Stable Ambulatory Status:  Ambulatory Discharge Destination: Home Transportation: Private Auto Accompanied By: self Schedule Follow-up Appointment: No Clinical Summary of Care: Electronic Signature(s) Signed: 06/04/2018 5:23:21 PM By: Gretta Cool, BSN, RN, CWS, Kim RN, BSN Entered By: Gretta Cool, BSN, RN, CWS, Kim on 06/04/2018 14:04:22 James Moreno ([id number]) -------------------------------------------------------------------------------- Lower Extremity Assessment Details Patient Name: CHEZ, BULNES. Date of Service: 06/04/2018 1:45 PM Medical Record Number: [id number] Patient Account Number: [id number] Date of Birth/Sex: [birth year] (53 y.o. M) Treating RN: Army Melia Primary Care Tierany Appleby: Lelon Huh Other Clinician: Referring Marshon Bangs: Lelon Huh Treating Kupono Marling/Extender: Ricard Dillon Weeks in Treatment: 16 Edema Assessment Assessed: [Left: No] [Right: No] Edema: [Left: N] [Right: o] Electronic Signature(s) Signed: 06/04/2018 3:02:20 PM By: Army Melia Entered By: Army Melia on 06/04/2018 13:58:10 James Moreno ([id number]) -------------------------------------------------------------------------------- Multi Wound Chart Details Patient Name: James Moreno. Date of Service: 06/04/2018 1:45 PM Medical Record Number: [id number] Patient Account Number: [id number] Date of Birth/Sex: 04-22-1965 (53 y.o. M) Treating RN: Cornell Barman Primary Care Alaysia Lightle: Lelon Huh Other Clinician: Referring Felicha Frayne: Lelon Huh Treating Jenniferlynn Saad/Extender: Tito Dine in Treatment: 16 Photos: [2:No Photos] [N/A:N/A] Wound Location: [2:Right Calcaneus] [N/A:N/A] Wounding Event: [2:Gradually Appeared] [N/A:N/A] Primary Etiology: [2:Diabetic Wound/Ulcer of the Lower Extremity] [N/A:N/A] Comorbid History: [2:Hypertension, Hepatitis B, Type II Diabetes] [N/A:N/A] Date Acquired: [2:01/22/2018] [N/A:N/A] Weeks of Treatment: [2:16] [N/A:N/A] Wound Status: [2:Healed - Epithelialized]  [N/A:N/A] Measurements L x W x D [2:0x0x0] [N/A:N/A] (cm) Area (cm) : [2:0] [N/A:N/A] Volume (cm) : [2:0] [N/A:N/A] % Reduction in Area: [2:100.00%] [N/A:N/A] % Reduction in Volume: [2:100.00%] [N/A:N/A] Classification: [2:Grade 1] [N/A:N/A] Exudate Amount: [2:None Present] [N/A:N/A] Wound Margin: [2:Flat and Intact] [N/A:N/A] Granulation Amount: [2:None Present (0%)] [N/A:N/A] Necrotic Amount: [2:Medium (34-66%)] [N/A:N/A] Necrotic Tissue: [2:Eschar] [N/A:N/A] Exposed Structures: [2:Fat Layer (Subcutaneous Tissue) Exposed: Yes Fascia: No Tendon: No Muscle: No Joint: No Bone: No] [N/A:N/A] Epithelialization: [2:None] [  N/A:N/A] Periwound Skin Texture: [2:Callus: Yes Scarring: Yes Excoriation: No Induration: No Crepitus: No Rash: No] [N/A:N/A] Periwound Skin Moisture: [2:Dry/Scaly: Yes Maceration: No] [N/A:N/A] Periwound Skin Color: [2:Atrophie Blanche: No Cyanosis: No Ecchymosis: No Erythema: No Hemosiderin Staining: No Mottled: No Pallor: No Rubor: No] [N/A:N/A] Temperature: No Abnormality N/A N/A Tenderness on Palpation: Yes N/A N/A Wound Preparation: Ulcer Cleansing: N/A N/A Rinsed/Irrigated with Saline Topical Anesthetic Applied: None Treatment Notes Electronic Signature(s) Signed: 06/04/2018 5:42:07 PM By: Linton Ham MD Entered By: Linton Ham on 06/04/2018 14:46:54 James Moreno ([id number]) -------------------------------------------------------------------------------- Multi-Disciplinary Care Plan Details Patient Name: BRISTON, LAX. Date of Service: 06/04/2018 1:45 PM Medical Record Number: [id number] Patient Account Number: [id number] Date of Birth/Sex: February 04, 1966 (53 y.o. M) Treating RN: Cornell Barman Primary Care Sumayyah Custodio: Lelon Huh Other Clinician: Referring Catelynn Sparger: Lelon Huh Treating Derreon Consalvo/Extender: Tito Dine in Treatment: 16 Active Inactive Electronic Signature(s) Signed: 06/06/2018 8:15:50 AM By: Gretta Cool, BSN, RN,  CWS, Kim RN, BSN Previous Signature: 06/04/2018 5:23:21 PM Version By: Gretta Cool, BSN, RN, CWS, Kim RN, BSN Entered By: Gretta Cool, BSN, RN, CWS, Kim on 06/06/2018 08:15:49 James Moreno ([id number]) -------------------------------------------------------------------------------- Pain Assessment Details Patient Name: OLLIN, HOCHMUTH. Date of Service: 06/04/2018 1:45 PM Medical Record Number: [id number] Patient Account Number: [id number] Date of Birth/Sex: [birth year] (53 y.o. M) Treating RN: Army Melia Primary Care Kanyon Seibold: Lelon Huh Other Clinician: Referring Elford Evilsizer: Lelon Huh Treating Arabelle Bollig/Extender: Tito Dine in Treatment: 16 Active Problems Location of Pain Severity and Description of Pain Patient Has Paino No Site Locations Pain Management and Medication Current Pain Management: Electronic Signature(s) Signed: 06/04/2018 3:02:20 PM By: Army Melia Entered By: Army Melia on 06/04/2018 13:55:23 James Moreno ([id number]) -------------------------------------------------------------------------------- Patient/Caregiver Education Details Patient Name: IZEL, EISENHARDT. Date of Service: 06/04/2018 1:45 PM Medical Record Number: [id number] Patient Account Number: [id number] Date of Birth/Gender: Jul 31, 1965 (53 y.o. M) Treating RN: Cornell Barman Primary Care Physician: Lelon Huh Other Clinician: Referring Physician: Lelon Huh Treating Physician/Extender: Tito Dine in Treatment: 16 Education Assessment Education Provided To: Patient Education Topics Provided Wound/Skin Impairment: Handouts: Other: protect new skin for a few weeks and follow up with vascular Methods: Demonstration Responses: State content correctly Electronic Signature(s) Signed: 06/04/2018 5:23:21 PM By: Gretta Cool, BSN, RN, CWS, Kim RN, BSN Entered By: Gretta Cool, BSN, RN, CWS, Kim on 06/04/2018 14:04:04 James Moreno  ([id number]) -------------------------------------------------------------------------------- Wound Assessment Details Patient Name: ORLANDER, NORWOOD. Date of Service: 06/04/2018 1:45 PM Medical Record Number: [id number] Patient Account Number: [id number] Date of Birth/Sex: 06-Jun-1965 (53 y.o. M) Treating RN: Cornell Barman Primary Care Marrion Accomando: Lelon Huh Other Clinician: Referring Devian Bartolomei: Lelon Huh Treating Jaylianna Tatlock/Extender: Tito Dine in Treatment: 16 Wound Status Wound Number: 2 Primary Etiology: Diabetic Wound/Ulcer of the Lower Extremity Wound Location: Right Calcaneus Wound Status: Healed - Epithelialized Wounding Event: Gradually Appeared Comorbid Hypertension, Hepatitis B, Type II Diabetes Date Acquired: 01/22/2018 History: Weeks Of Treatment: 16 Clustered Wound: No Photos Photo Uploaded By: Army Melia on 06/04/2018 15:01:51 Wound Measurements Length: (cm) 0 % Re Width: (cm) 0 % Re Depth: (cm) 0 Epit Area: (cm) 0 Tun Volume: (cm) 0 Und duction in Area: 100% duction in Volume: 100% helialization: None neling: No ermining: No Wound Description Classification: Grade 1 Wound Margin: Flat and Intact Exudate Amount: None Present Foul Odor After Cleansing: No Slough/Fibrino Yes Wound Bed Granulation Amount: None Present (0%) Exposed Structure Necrotic Amount: Medium (34-66%) Fascia Exposed: No Necrotic Quality: Eschar Fat Layer (Subcutaneous Tissue) Exposed: Yes Tendon Exposed: No Muscle  Exposed: No Joint Exposed: No Bone Exposed: No Periwound Skin Texture Texture Color No Abnormalities Noted: No No Abnormalities Noted: No KAYDON, HUSBY ([id number]) Callus: Yes Atrophie Blanche: No Crepitus: No Cyanosis: No Excoriation: No Ecchymosis: No Induration: No Erythema: No Rash: No Hemosiderin Staining: No Scarring: Yes Mottled: No Pallor: No Moisture Rubor: No No Abnormalities Noted: No Dry / Scaly: Yes Temperature /  Pain Maceration: No Temperature: No Abnormality Tenderness on Palpation: Yes Wound Preparation Ulcer Cleansing: Rinsed/Irrigated with Saline Topical Anesthetic Applied: None Electronic Signature(s) Signed: 06/04/2018 5:23:21 PM By: Gretta Cool, BSN, RN, CWS, Kim RN, BSN Entered By: Gretta Cool, BSN, RN, CWS, Kim on 06/04/2018 14:01:46 James Moreno ([id number]) -------------------------------------------------------------------------------- [place] Details Patient Name: James Moreno. Date of Service: 06/04/2018 1:45 PM Medical Record Number: [id number] Patient Account Number: [id number] Date of Birth/Sex: [birth year] (53 y.o. M) Treating RN: Army Melia Primary Care Sahib Pella: Lelon Huh Other Clinician: Referring Devony Mcgrady: Lelon Huh Treating Omair Dettmer/Extender: Tito Dine in Treatment: 16 Vital Signs Time Taken: 14:01 Reference Range: 80 - 120 mg / dl Height (in): 71 Weight (lbs): 238 Body Mass Index (BMI): 33.2 Notes Pt refused to have vitals taken Electronic Signature(s) Signed: 06/04/2018 2:06:20 PM By: Army Melia Entered By: Army Melia on 06/04/2018 14:06:20

## 2018-06-10 DIAGNOSIS — E785 Hyperlipidemia, unspecified: Secondary | ICD-10-CM | POA: Diagnosis not present

## 2018-06-10 DIAGNOSIS — N189 Chronic kidney disease, unspecified: Secondary | ICD-10-CM | POA: Diagnosis not present

## 2018-06-10 DIAGNOSIS — E1165 Type 2 diabetes mellitus with hyperglycemia: Secondary | ICD-10-CM | POA: Diagnosis not present

## 2018-06-10 DIAGNOSIS — E1121 Type 2 diabetes mellitus with diabetic nephropathy: Secondary | ICD-10-CM | POA: Diagnosis not present

## 2018-06-10 DIAGNOSIS — E118 Type 2 diabetes mellitus with unspecified complications: Secondary | ICD-10-CM | POA: Diagnosis not present

## 2018-06-10 DIAGNOSIS — N2581 Secondary hyperparathyroidism of renal origin: Secondary | ICD-10-CM | POA: Diagnosis not present

## 2018-06-10 DIAGNOSIS — E1140 Type 2 diabetes mellitus with diabetic neuropathy, unspecified: Secondary | ICD-10-CM | POA: Diagnosis not present

## 2018-06-10 DIAGNOSIS — E08311 Diabetes mellitus due to underlying condition with unspecified diabetic retinopathy with macular edema: Secondary | ICD-10-CM | POA: Diagnosis not present

## 2018-06-10 DIAGNOSIS — I10 Essential (primary) hypertension: Secondary | ICD-10-CM | POA: Diagnosis not present

## 2018-06-10 DIAGNOSIS — I7389 Other specified peripheral vascular diseases: Secondary | ICD-10-CM | POA: Diagnosis not present

## 2018-06-10 DIAGNOSIS — E0859 Diabetes mellitus due to underlying condition with other circulatory complications: Secondary | ICD-10-CM | POA: Diagnosis not present

## 2018-06-10 DIAGNOSIS — E669 Obesity, unspecified: Secondary | ICD-10-CM | POA: Diagnosis not present

## 2018-06-20 ENCOUNTER — Other Ambulatory Visit: Payer: Self-pay | Admitting: *Deleted

## 2018-06-20 DIAGNOSIS — M79604 Pain in right leg: Secondary | ICD-10-CM

## 2018-06-20 DIAGNOSIS — L905 Scar conditions and fibrosis of skin: Secondary | ICD-10-CM

## 2018-06-20 DIAGNOSIS — R52 Pain, unspecified: Secondary | ICD-10-CM

## 2018-06-20 NOTE — Telephone Encounter (Signed)
Patient is requesting a refill for oxycodone be sent to [facility] by 06/24/2018. Patient states due to his health issues he can only go to the pharmacy on 06/24/2018. Patient stated [facility] only sees immunocompromised patient's on Tuesdays. Please advise?

## 2018-06-23 MED ORDER — OXYCODONE HCL 5 MG PO TABS: 5.0000 mg | ORAL_TABLET | Freq: Four times a day (QID) | ORAL | 0 refills | Status: DC | PRN

## 2018-06-23 NOTE — Telephone Encounter (Signed)
Patient requesting refill sent in today if possible would like to pick up tomorrow morning.

## 2018-07-11 ENCOUNTER — Telehealth: Payer: Self-pay

## 2018-07-11 NOTE — Telephone Encounter (Signed)
Virtual Visit Pre-Appointment Phone Call  Steps For Call:  1. Confirm consent - "In the setting of the current Covid19 crisis, you are scheduled for a VIDEO visit with your provider on 07/17/2018 at 11:40am.  Just as we do with many in-office visits, in order for you to participate in this visit, we must obtain consent.  If you'd like, I can send this to your mychart (if signed up) or email for you to review.  Otherwise, I can obtain your verbal consent now.  All virtual visits are billed to your insurance company just like a normal visit would be.  By agreeing to a virtual visit, we'd like you to understand that the technology does not allow for your provider to perform an examination, and thus may limit your provider's ability to fully assess your condition. If your provider identifies any concerns that need to be evaluated in person, we will make arrangements to do so.  Finally, though the technology is pretty good, we cannot assure that it will always work on either your or our end, and in the setting of a video visit, we may have to convert it to a phone-only visit.  In either situation, we cannot ensure that we have a secure connection.  Are you willing to proceed?" STAFF: Did the patient verbally acknowledge consent to telehealth visit? Document YES/NO here: ...  2. Confirm the BEST phone number to call the day of the visit by including in appointment notes  3. Give patient instructions for WebEx/MyChart download to smartphone as below or Doximity/Doxy.me if video visit (depending on what platform provider is using)  4. Advise patient to be prepared with their blood pressure, heart rate, weight, any heart rhythm information, their current medicines, and a piece of paper and pen handy for any instructions they may receive the day of their visit  5. Inform patient they will receive a phone call 15 minutes prior to their appointment time (may be from unknown caller ID) so they should be prepared  to answer  6. Confirm that appointment type is correct in Epic appointment notes (VIDEO vs PHONE)     TELEPHONE CALL NOTE  James Moreno has been deemed a candidate for a follow-up tele-health visit to limit community exposure during the Covid-19 pandemic. I spoke with the patient via phone to ensure availability of phone/video source, confirm preferred email & phone number, and discuss instructions and expectations.  I reminded DAVIE CLAUD to be prepared with any vital sign and/or heart rhythm information that could potentially be obtained via home monitoring, at the time of his visit. I reminded HUNNER GARCON to expect a phone call at the time of his visit if his visit.  Janan Ridge, [place] 07/11/2018 2:43 PM   INSTRUCTIONS FOR DOWNLOADING THE [place] APP TO SMARTPHONE  - If Apple, ask patient to go to [employer] and type in WebEx in the search bar. [place] [employer], the blue/green circle. If Android, go to [employer] and type in [employer] in the search bar. The app is free but as with any other app downloads, their phone may require them to verify saved payment information or Apple/Android password.  - The patient does NOT have to create an account. - On the day of the visit, the assist will walk the patient through joining the meeting with the meeting number/password.  INSTRUCTIONS FOR DOWNLOADING THE MYCHART APP TO SMARTPHONE  - The patient must first make sure  to have activated MyChart and know their login information - If Apple, go to [employer] and type in MyChart in the search bar and download the app. If Android, ask patient to go to [employer] and type in [place] in the search bar and download the app. The app is free but as with any other app downloads, their phone may require them to verify saved payment information or Apple/Android password.  - The patient will need to then log into the app with their MyChart username and password, and select [facility] as their healthcare provider to link the account. When it is time for your visit, go to the MyChart app, find appointments, and click Begin Video Visit. Be sure to Select Allow for your device to access the Microphone and Camera for your visit. You will then be connected, and your provider will be with you shortly.  **If they have any issues connecting, or need assistance please contact MyChart service desk [phone])**  **If using a computer, in order to ensure the best quality for their visit they will need to use either of the following Internet Browsers: [employer], or Google Chrome**  IF USING DOXIMITY or DOXY.ME - The patient will receive a link just prior to their visit, either by text or email (to be determined day of appointment depending on if it's doxy.me or Doximity).     FULL LENGTH CONSENT FOR TELE-HEALTH VISIT   I hereby voluntarily request, consent and authorize [facility] and its employed or contracted physicians, physician assistants, nurse practitioners or other licensed health care professionals (the Practitioner), to provide me with telemedicine health care services (the "Services") as deemed necessary by the treating Practitioner. I acknowledge and consent to receive the Services by the Practitioner via telemedicine. I understand that the telemedicine visit will involve communicating with the Practitioner through live audiovisual communication technology and the disclosure of certain medical information by electronic transmission. I acknowledge that I have been given the opportunity to request an in-person assessment or other available alternative prior to the telemedicine visit and am voluntarily participating in the telemedicine visit.  I understand that I have the right to withhold or withdraw my consent to the use of telemedicine in the course of my care at any time, without affecting my right to future care or treatment, and that the  Practitioner or I may terminate the telemedicine visit at any time. I understand that I have the right to inspect all information obtained and/or recorded in the course of the telemedicine visit and may receive copies of available information for a reasonable fee.  I understand that some of the potential risks of receiving the Services via telemedicine include:  Marland Kitchen Delay or interruption in medical evaluation due to technological equipment failure or disruption; . Information transmitted may not be sufficient (e.g. poor resolution of images) to allow for appropriate medical decision making by the Practitioner; and/or  . In rare instances, security protocols could fail, causing a breach of personal health information.  Furthermore, I acknowledge that it is my responsibility to provide information about my medical history, conditions and care that is complete and accurate to the best of my ability. I acknowledge that Practitioner's advice, recommendations, and/or decision may be based on factors not within their control, such as incomplete or inaccurate data provided by me or distortions of diagnostic images or specimens that may result from electronic transmissions. I understand that the practice of medicine is not an exact science  and that Practitioner makes no warranties or guarantees regarding treatment outcomes. I acknowledge that I will receive a copy of this consent concurrently upon execution via email to the email address I last provided but may also request a printed copy by calling the office of [place].    I understand that my insurance will be billed for this visit.   I have read or had this consent read to me. . I understand the contents of this consent, which adequately explains the benefits and risks of the Services being provided via telemedicine.  . I have been provided ample opportunity to ask questions regarding this consent and the Services and have had my questions answered to my  satisfaction. . I give my informed consent for the services to be provided through the use of telemedicine in my medical care  By participating in this telemedicine visit I agree to the above.

## 2018-07-17 ENCOUNTER — Telehealth: Payer: Medicare Other | Admitting: Cardiovascular Disease

## 2018-07-17 ENCOUNTER — Other Ambulatory Visit: Payer: Self-pay

## 2018-07-17 ENCOUNTER — Telehealth: Payer: Self-pay

## 2018-07-17 NOTE — Telephone Encounter (Signed)
Called patient.  No answer. LMOV.  Need to obtain vitals.  Need to review medications and allergies.

## 2018-07-21 ENCOUNTER — Other Ambulatory Visit: Payer: Self-pay | Admitting: Family Medicine

## 2018-07-21 DIAGNOSIS — R52 Pain, unspecified: Secondary | ICD-10-CM

## 2018-07-21 DIAGNOSIS — M79604 Pain in right leg: Secondary | ICD-10-CM

## 2018-07-21 DIAGNOSIS — L905 Scar conditions and fibrosis of skin: Secondary | ICD-10-CM

## 2018-07-21 NOTE — Telephone Encounter (Signed)
Pt needing a refill on:  oxyCODONE (OXY IR/ROXICODONE) 5 MG immediate release tablet  Please fill at:  [facility] [address], [place] - [place] [phone] (Phone) [phone] (Fax)    Please advise.  Thanks, [employer]

## 2018-07-22 MED ORDER — OXYCODONE HCL 5 MG PO TABS: 5.0000 mg | ORAL_TABLET | Freq: Four times a day (QID) | ORAL | 0 refills | Status: DC | PRN

## 2018-07-22 NOTE — Progress Notes (Signed)
Virtual Visit via Video Note   This visit type was conducted due to national recommendations for restrictions regarding the COVID-19 Pandemic (e.g. social distancing) in an effort to limit this patient's exposure and mitigate transmission in our community.  Due to his co-morbid illnesses, this patient is at least at moderate risk for complications without adequate follow up.  This format is felt to be most appropriate for this patient at this time.  All issues noted in this document were discussed and addressed.  A limited physical exam was performed with this format.  Please refer to the patient's chart for his consent to telehealth for [facility]   I connected with  [name] on 07/23/18 by a video enabled telemedicine application and verified that I am speaking with the correct person using two identifiers. I discussed the limitations of evaluation and management by telemedicine. The patient expressed understanding and agreed to proceed.   Evaluation Performed:  Follow-up visit  Date:  07/23/2018   ID:  Candice, Lunney 10-13-1965, MRN [id number]  Patient Location:  [place] [address]   Provider location:   [facility], [place] office  PCP:  Birdie Sons, MD  Cardiologist:  Arvid Right [facility]   Chief Complaint:  Fullness in chest after dinner   History of Present Illness:    NIMESH RIOLO is a 53 y.o. male who presents via audio/video conferencing for a telehealth visit today.   The patient does not symptoms concerning for COVID-19 infection (fever, chills, cough, or new SHORTNESS OF BREATH).   Patient has a past medical history of diabetes,  previously on peritoneal hemodialysis,   kidney transplant April 2016 in [place],  hypertension ,  long smoking history who continues to smoke,  Bilateral carotid disease, 60-70% bilaterally in 2015 evaluated for chest pain in the past, presenting for routine  followup of his PAD.   Pressure in chest after eating For 4 to 6 months Chest discomfort when laying down Weight is up, 240 pounds, up from 207 eating poorly  Sees [facility] Has to stop walking secondary to leg pain, gets claudication   Other past medical history previously required iron infusions  history of chronic discomfort in his legs. Reports having ABIs with Dr. Francoise Schaumann   Prior CV studies:   The following studies were reviewed today:  Echocardiogram over the past 6 months was essentially normal, normal ejection fraction estimated at greater than 55% . This was done in preparation for kidney transplant listing .   stress test in 2013 showed no ischemia, done at [facility], repeat stress test in the past month or so at [place] [facility]  Did stress test in the past on treadmill, had trouble Also did lexiscan 2015, had severe SOB   Past Medical History:  Diagnosis Date  . Acute kidney failure, unspecified ([place])   . GERD (gastroesophageal reflux disease)   . History of hepatitis B   . Hypothyroidism   . Mitral valve disorders(424.0)   . Pityriasis 12/27/2014  . Primary pulmonary HTN ([place])   . Tricuspid valve disorders, specified as nonrheumatic    Past Surgical History:  Procedure Laterality Date  . APPENDECTOMY    . Carotid Doppler Ultrasound  06/14/2009   39% stenosis of bilateral internal carotid artery, bilateral anterograde vertebral flow  . EYE SURGERY     right  . HEMORROIDECTOMY    . KIDNEY TRANSPLANT Right 2016  . MECKEL  DIVERTICULUM EXCISION     infancy  . Myocardial Perfusion scan  01/17/2009   [facility], non- ischemic. LVEF= 55%  . PARS PLANA VITRECTOMY Left 02/09/2015   Procedure: Pan retinal photocoagulation 96789;  Surgeon: Milus Height, MD;  Location: [facility] ORS;  Service: Ophthalmology;  Laterality: Left;  . REFRACTIVE SURGERY Left   . sleep study  01/09/2011   Severe sleep apnea. AHI 72.9/hr. RDI=83.0/hr. Desaturation to 69.0%  Emergency CPAP titaration to 14.0cm     Current Meds  Medication Sig  . acetaminophen (TYLENOL) 500 MG tablet Take 500 mg by mouth every 6 (six) hours as needed (headaches.).  Marland Kitchen alprazolam (XANAX) 2 MG tablet TAKE 1/2 TO 1 (ONE-HALF TO ONE) TABLET BY MOUTH THREE TIMES DAILY AS NEEDED  . amLODipine (NORVASC) 5 MG tablet Take 5 mg by mouth daily.  . Blood Glucose Monitoring Suppl (GLUCOCOM BLOOD GLUCOSE MONITOR) DEVI Frequency:ONCE   Dosage:0.0     Instructions:  Note:Dose: N/A  . carvedilol (COREG) 6.25 MG tablet TAKE 1 TABLET TWICE A DAY WITH MEALS  . cinacalcet (SENSIPAR) 30 MG tablet Take 30 mg by mouth daily.  . fenofibrate (TRICOR) 145 MG tablet Take 145 mg by mouth daily.  Marland Kitchen ibuprofen (ADVIL,MOTRIN) 200 MG tablet Take 200-400 mg by mouth every 8 (eight) hours as needed (for headaches.).  Marland Kitchen insulin aspart (NOVOLOG) 100 UNIT/ML injection Inject 10-16 Units into the skin 3 (three) times daily before meals. Sliding scale  . Insulin Degludec (TRESIBA FLEXTOUCH [place]) Inject 30 Units into the skin daily with lunch.   . losartan (COZAAR) 50 MG tablet Take 50 mg by mouth at bedtime.   . lovastatin (MEVACOR) 40 MG tablet Take 40 mg by mouth at bedtime.  . mycophenolate (MYFORTIC) 360 MG TBEC EC tablet Take 360 mg by mouth 2 (two) times daily.  Marland Kitchen omeprazole (PRILOSEC) 20 MG capsule Take 20 mg by mouth daily.  Marland Kitchen oxyCODONE (OXY IR/ROXICODONE) 5 MG immediate release tablet Take 1-2 tablets (5-10 mg total) by mouth every 6 (six) hours as needed for severe pain.  Marland Kitchen tacrolimus (PROGRAF) 1 MG capsule Take 3 mg by mouth 2 (two) times daily.   Marland Kitchen VIAGRA 100 MG tablet TAKE ONE-HALF (1/2) TO ONE TABLET DAILY AS NEEDED FOR ERECTILE DYSFUNCTION     Allergies:   Patient has no known allergies.   Social History   Tobacco Use  . Smoking status: Current Some Day Smoker    Packs/day: 0.25    Years: 31.00    Pack years: 7.75    Types: Cigarettes  . Smokeless tobacco: Never Used  . Tobacco comment: 1 pack  every 3-4 days  Substance Use Topics  . Alcohol use: No    Alcohol/week: 0.0 standard drinks    Comment: Excessive alcohol consumption in the past. Quit around 2016  . Drug use: No     Current Outpatient Medications on File Prior to Visit  Medication Sig Dispense Refill  . acetaminophen (TYLENOL) 500 MG tablet Take 500 mg by mouth every 6 (six) hours as needed (headaches.).    Marland Kitchen alprazolam (XANAX) 2 MG tablet TAKE 1/2 TO 1 (ONE-HALF TO ONE) TABLET BY MOUTH THREE TIMES DAILY AS NEEDED 90 tablet 3  . amLODipine (NORVASC) 5 MG tablet Take 5 mg by mouth daily.    . Blood Glucose Monitoring Suppl (GLUCOCOM BLOOD GLUCOSE MONITOR) DEVI Frequency:ONCE   Dosage:0.0     Instructions:  Note:Dose: N/A    . carvedilol (COREG) 6.25 MG tablet TAKE 1 TABLET  TWICE A DAY WITH MEALS 180 tablet 0  . cinacalcet (SENSIPAR) 30 MG tablet Take 30 mg by mouth daily.    . fenofibrate (TRICOR) 145 MG tablet Take 145 mg by mouth daily.    Marland Kitchen ibuprofen (ADVIL,MOTRIN) 200 MG tablet Take 200-400 mg by mouth every 8 (eight) hours as needed (for headaches.).    Marland Kitchen insulin aspart (NOVOLOG) 100 UNIT/ML injection Inject 10-16 Units into the skin 3 (three) times daily before meals. Sliding scale    . Insulin Degludec (TRESIBA FLEXTOUCH [place]) Inject 30 Units into the skin daily with lunch.     . losartan (COZAAR) 50 MG tablet Take 50 mg by mouth at bedtime.   11  . lovastatin (MEVACOR) 40 MG tablet Take 40 mg by mouth at bedtime.    . mycophenolate (MYFORTIC) 360 MG TBEC EC tablet Take 360 mg by mouth 2 (two) times daily.    Marland Kitchen omeprazole (PRILOSEC) 20 MG capsule Take 20 mg by mouth daily.    Marland Kitchen oxyCODONE (OXY IR/ROXICODONE) 5 MG immediate release tablet Take 1-2 tablets (5-10 mg total) by mouth every 6 (six) hours as needed for severe pain. 240 tablet 0  . tacrolimus (PROGRAF) 1 MG capsule Take 3 mg by mouth 2 (two) times daily.   11  . VIAGRA 100 MG tablet TAKE ONE-HALF (1/2) TO ONE TABLET DAILY AS NEEDED FOR ERECTILE DYSFUNCTION 30  tablet 5   No current facility-administered medications on file prior to visit.      Family Hx: The patient's family history includes Hyperlipidemia in his mother; Hypertension in his mother; Melanoma in his father.  ROS:   Please see the history of present illness.    Review of Systems  Constitutional: Negative.   HENT: Negative.   Respiratory: Negative.   Cardiovascular: Negative.   Gastrointestinal: Positive for abdominal pain and heartburn.  Musculoskeletal: Negative.   Neurological: Negative.   Psychiatric/Behavioral: Negative.   All other systems reviewed and are negative.     Labs/Other Tests and Data Reviewed:    Recent Labs: No results found for requested labs within last 8760 hours.   Recent Lipid Panel Lab Results  Component Value Date/Time   CHOL 166 01/10/2018 08:21 AM   TRIG 296 (H) 01/10/2018 08:21 AM   HDL 30 (L) 01/10/2018 08:21 AM   CHOLHDL 5.5 (H) 01/10/2018 08:21 AM   LDLCALC 77 01/10/2018 08:21 AM    Wt Readings from Last 3 Encounters:  01/02/18 238 lb 9.6 oz (108.2 kg)  10/30/17 237 lb 12.8 oz (107.9 kg)  10/16/17 236 lb (107 kg)     Exam:    Vital Signs: Vital signs may also be detailed in the HPI There were no vitals taken for this visit.  Wt Readings from Last 3 Encounters:  01/02/18 238 lb 9.6 oz (108.2 kg)  10/30/17 237 lb 12.8 oz (107.9 kg)  10/16/17 236 lb (107 kg)   Temp Readings from Last 3 Encounters:  01/02/18 97.9 F (36.6 C) (Oral)  10/16/17 97.7 F (36.5 C) (Oral)  07/03/17 98 F (36.7 C) (Oral)   BP Readings from Last 3 Encounters:  01/02/18 130/70  01/02/18 (!) 148/74  10/30/17 132/78   Pulse Readings from Last 3 Encounters:  01/02/18 95  10/30/17 86  10/16/17 98    130/70, 70, Pulse 90, resp 16  Well nourished, well developed male in no acute distress. Constitutional:  oriented to person, place, and time. No distress.     ASSESSMENT & PLAN:  PAD (peripheral artery disease) (HCC) Not on asa, took  himself off, secondary to bruising Tried asa QOD, same problem On a statin LDL above goal,  Atherosclerosis of artery of extremity with ulceration ([place]) Followed by [facility]  Bilateral carotid artery disease, unspecified type ([place]) Followed by [facility]  Mixed hyperlipidemia Change to crestor, Stop lovastatin Goal LDL <70  Hypertension secondary to other renal disorders Blood pressure is well controlled on today's visit. No changes made to the medications.  Chest discomfort Consistent with GERD symptoms, eating late at night and seems to have symptoms when he lays down goes to bed Symptoms better with belching    COVID-19 Education: The signs and symptoms of COVID-19 were discussed with the patient and how to seek care for testing (follow up with PCP or arrange E-visit).  The importance of social distancing was discussed today.  Patient Risk:   After full review of this patients clinical status, I feel that they are at least moderate risk at this time.  Time:   Today, I have spent 25 minutes with the patient with telehealth technology discussing the cardiac and medical problems/diagnoses detailed above   10 min spent reviewing the chart prior to patient visit today   Medication Adjustments/Labs and Tests Ordered: Current medicines are reviewed at length with the patient today.  Concerns regarding medicines are outlined above.   Tests Ordered: No tests ordered   Medication Changes: No changes made   Disposition: Follow-up in 12 months   Signed, Ida Rogue, MD  07/23/2018 12:09 PM    [place] Office [address] [place] [address], [place], [place] [address]

## 2018-07-23 ENCOUNTER — Telehealth (INDEPENDENT_AMBULATORY_CARE_PROVIDER_SITE_OTHER): Payer: Medicare Other | Admitting: Cardiovascular Disease

## 2018-07-23 ENCOUNTER — Other Ambulatory Visit: Payer: Self-pay

## 2018-07-23 DIAGNOSIS — L97909 Non-pressure chronic ulcer of unspecified part of unspecified lower leg with unspecified severity: Secondary | ICD-10-CM

## 2018-07-23 DIAGNOSIS — I739 Peripheral vascular disease, unspecified: Secondary | ICD-10-CM

## 2018-07-23 DIAGNOSIS — I70299 Other atherosclerosis of native arteries of extremities, unspecified extremity: Secondary | ICD-10-CM

## 2018-07-23 DIAGNOSIS — N2889 Other specified disorders of kidney and ureter: Secondary | ICD-10-CM

## 2018-07-23 DIAGNOSIS — I151 Hypertension secondary to other renal disorders: Secondary | ICD-10-CM

## 2018-07-23 DIAGNOSIS — I779 Disorder of arteries and arterioles, unspecified: Secondary | ICD-10-CM

## 2018-07-23 DIAGNOSIS — R0789 Other chest pain: Secondary | ICD-10-CM

## 2018-07-23 DIAGNOSIS — E782 Mixed hyperlipidemia: Secondary | ICD-10-CM

## 2018-07-23 MED ORDER — OMEPRAZOLE 20 MG PO CPDR: 20.0000 mg | DELAYED_RELEASE_CAPSULE | Freq: Two times a day (BID) | ORAL | 3 refills | Status: DC

## 2018-07-23 MED ORDER — ROSUVASTATIN CALCIUM 40 MG PO TABS: 40.0000 mg | ORAL_TABLET | Freq: Every day | ORAL | 3 refills | Status: DC

## 2018-07-23 NOTE — Patient Instructions (Addendum)
Medication Instructions:  Your physician has recommended you make the following change in your medication:  1. STOP Lovastatin 2. START Rosuvastatin 40 mg once daily 3. INCREASE Omeprazole 20 mg twice a day (For acid at night)  4. Gas-X over the counter as needed  Don't eat too late at night  If you need a refill on your cardiac medications before your next appointment, please call your pharmacy.    Lab work: Liver and lipid labs at the end of July. Go to the [facility] Entrance of the hospital and check in at the first desk on the right and they will direct you where to go. Make sure not to eat or drink anything after midnight the night before except water with your pills.    If you have labs (blood work) drawn today and your tests are completely normal, you will receive your results only by: Marland Kitchen MyChart Message (if you have MyChart) OR . A paper copy in the mail If you have any lab test that is abnormal or we need to change your treatment, we will call you to review the results.   Testing/Procedures: No new testing needed   Follow-Up: At [facility], you and your health needs are our priority.  As part of our continuing mission to provide you with exceptional heart care, we have created designated Provider Care Teams.  These Care Teams include your primary Cardiologist (physician) and Advanced Practice Providers (APPs -  Physician Assistants and Nurse Practitioners) who all work together to provide you with the care you need, when you need it.  . You will need a follow up appointment in 12 months .   Please call our office 2 months in advance to schedule this appointment.    . Providers on your designated Care Team:   . Murray Hodgkins, NP . Christell Faith, PA-C . Marrianne Mood, PA-C  Any Other Special Instructions Will Be Listed Below (If Applicable).  For educational health videos Log in to : [email or web address] Or : [email or web address], password : [email or web address]

## 2018-08-01 DIAGNOSIS — F17200 Nicotine dependence, unspecified, uncomplicated: Secondary | ICD-10-CM | POA: Insufficient documentation

## 2018-08-15 ENCOUNTER — Other Ambulatory Visit: Payer: Self-pay | Admitting: Family Medicine

## 2018-08-15 DIAGNOSIS — M79604 Pain in right leg: Secondary | ICD-10-CM

## 2018-08-15 DIAGNOSIS — F17200 Nicotine dependence, unspecified, uncomplicated: Secondary | ICD-10-CM | POA: Diagnosis not present

## 2018-08-15 DIAGNOSIS — L905 Scar conditions and fibrosis of skin: Secondary | ICD-10-CM

## 2018-08-15 DIAGNOSIS — R52 Pain, unspecified: Secondary | ICD-10-CM

## 2018-08-15 NOTE — Telephone Encounter (Signed)
Pt needs a refill on Oxycodone 5mg   [place]  [employer]

## 2018-08-19 MED ORDER — OXYCODONE HCL 5 MG PO TABS: 5.0000 mg | ORAL_TABLET | Freq: Four times a day (QID) | ORAL | 0 refills | Status: DC | PRN

## 2018-08-19 NOTE — Telephone Encounter (Signed)
Patient called again requesting refills. Please review. Thanks!

## 2018-09-05 ENCOUNTER — Telehealth: Payer: Self-pay

## 2018-09-05 MED ORDER — ROSUVASTATIN CALCIUM 40 MG PO TABS: 40.0000 mg | ORAL_TABLET | Freq: Every day | ORAL | 2 refills | Status: DC

## 2018-09-05 NOTE — Telephone Encounter (Signed)
Requested Prescriptions   Signed Prescriptions Disp Refills  . rosuvastatin (CRESTOR) 40 MG tablet 90 tablet 2    Sig: Take 1 tablet (40 mg total) by mouth daily.    Authorizing Provider: Minna Merritts    Ordering User: Raelene Bott, Rykar Lebleu L

## 2018-09-10 ENCOUNTER — Other Ambulatory Visit: Payer: Self-pay | Admitting: Family Medicine

## 2018-09-12 DIAGNOSIS — F17200 Nicotine dependence, unspecified, uncomplicated: Secondary | ICD-10-CM | POA: Diagnosis not present

## 2018-09-12 NOTE — Telephone Encounter (Signed)
Pt is calling about the refill on his generic Xanax  He needs these by the weekend  [address]

## 2018-09-12 NOTE — Telephone Encounter (Signed)
was last dispensed 3 days early on 08/16/2018

## 2018-09-15 ENCOUNTER — Other Ambulatory Visit: Payer: Self-pay | Admitting: Family Medicine

## 2018-09-15 DIAGNOSIS — M79604 Pain in right leg: Secondary | ICD-10-CM

## 2018-09-15 DIAGNOSIS — R52 Pain, unspecified: Secondary | ICD-10-CM

## 2018-09-15 DIAGNOSIS — L905 Scar conditions and fibrosis of skin: Secondary | ICD-10-CM

## 2018-09-15 NOTE — Telephone Encounter (Signed)
Pt needing refill on: oxyCODONE (OXY IR/ROXICODONE) 5 MG immediate release tablet  Please call into:  [facility] [address] [place] [phone] (Phone) [phone] (Fax)   Thanks, [employer]

## 2018-09-18 ENCOUNTER — Other Ambulatory Visit: Payer: Self-pay | Admitting: Family Medicine

## 2018-09-18 DIAGNOSIS — L905 Scar conditions and fibrosis of skin: Secondary | ICD-10-CM

## 2018-09-18 DIAGNOSIS — M79604 Pain in right leg: Secondary | ICD-10-CM

## 2018-09-18 DIAGNOSIS — R52 Pain, unspecified: Secondary | ICD-10-CM

## 2018-09-18 NOTE — Telephone Encounter (Signed)
Pt needing refill on: oxyCODONE (OXY IR/ROXICODONE) 5 MG immediate release tablet  Please fill at:  [place] [address] [place] [phone] (Phone) [phone] (Fax)   Thanks, [facility]

## 2018-09-19 MED ORDER — OXYCODONE HCL 5 MG PO TABS: 5.0000 mg | ORAL_TABLET | Freq: Four times a day (QID) | ORAL | 0 refills | Status: DC | PRN

## 2018-09-22 DIAGNOSIS — D2262 Melanocytic nevi of left upper limb, including shoulder: Secondary | ICD-10-CM | POA: Diagnosis not present

## 2018-09-22 DIAGNOSIS — Z08 Encounter for follow-up examination after completed treatment for malignant neoplasm: Secondary | ICD-10-CM | POA: Diagnosis not present

## 2018-09-22 DIAGNOSIS — Z85828 Personal history of other malignant neoplasm of skin: Secondary | ICD-10-CM | POA: Diagnosis not present

## 2018-09-22 DIAGNOSIS — D2261 Melanocytic nevi of right upper limb, including shoulder: Secondary | ICD-10-CM | POA: Diagnosis not present

## 2018-09-22 DIAGNOSIS — D225 Melanocytic nevi of trunk: Secondary | ICD-10-CM | POA: Diagnosis not present

## 2018-09-22 DIAGNOSIS — D2272 Melanocytic nevi of left lower limb, including hip: Secondary | ICD-10-CM | POA: Diagnosis not present

## 2018-09-22 DIAGNOSIS — L731 Pseudofolliculitis barbae: Secondary | ICD-10-CM | POA: Diagnosis not present

## 2018-09-22 DIAGNOSIS — D2271 Melanocytic nevi of right lower limb, including hip: Secondary | ICD-10-CM | POA: Diagnosis not present

## 2018-10-01 DIAGNOSIS — F17200 Nicotine dependence, unspecified, uncomplicated: Secondary | ICD-10-CM | POA: Diagnosis not present

## 2018-10-01 DIAGNOSIS — I6523 Occlusion and stenosis of bilateral carotid arteries: Secondary | ICD-10-CM | POA: Diagnosis not present

## 2018-10-01 DIAGNOSIS — I7409 Other arterial embolism and thrombosis of abdominal aorta: Secondary | ICD-10-CM | POA: Diagnosis not present

## 2018-10-01 DIAGNOSIS — I739 Peripheral vascular disease, unspecified: Secondary | ICD-10-CM | POA: Diagnosis not present

## 2018-10-03 ENCOUNTER — Other Ambulatory Visit: Payer: Self-pay

## 2018-10-03 MED ORDER — OMEPRAZOLE 20 MG PO CPDR: 20.0000 mg | DELAYED_RELEASE_CAPSULE | Freq: Two times a day (BID) | ORAL | 3 refills | Status: DC

## 2018-10-07 ENCOUNTER — Other Ambulatory Visit: Payer: Self-pay | Admitting: *Deleted

## 2018-10-07 MED ORDER — OMEPRAZOLE 20 MG PO CPDR: 20.0000 mg | DELAYED_RELEASE_CAPSULE | Freq: Two times a day (BID) | ORAL | 3 refills | Status: DC

## 2018-10-10 ENCOUNTER — Other Ambulatory Visit: Payer: Self-pay | Admitting: Family Medicine

## 2018-10-14 ENCOUNTER — Other Ambulatory Visit: Payer: Self-pay | Admitting: Family Medicine

## 2018-10-14 NOTE — Telephone Encounter (Signed)
Pt needing refill on: alprazolam Duanne Moron) 2 MG tablet  Please fill at: [place] [address] [place] [phone] (Phone) [phone] (Fax)   Thanks, [facility]

## 2018-10-15 MED ORDER — ALPRAZOLAM 2 MG PO TABS: 2.0000 mg | ORAL_TABLET | Freq: Three times a day (TID) | ORAL | 3 refills | Status: DC | PRN

## 2018-10-17 ENCOUNTER — Other Ambulatory Visit: Payer: Self-pay | Admitting: Family Medicine

## 2018-10-17 DIAGNOSIS — L905 Scar conditions and fibrosis of skin: Secondary | ICD-10-CM

## 2018-10-17 DIAGNOSIS — M79604 Pain in right leg: Secondary | ICD-10-CM

## 2018-10-17 DIAGNOSIS — R52 Pain, unspecified: Secondary | ICD-10-CM

## 2018-10-17 MED ORDER — OXYCODONE HCL 5 MG PO TABS: 5.0000 mg | ORAL_TABLET | Freq: Four times a day (QID) | ORAL | 0 refills | Status: DC | PRN

## 2018-10-17 NOTE — Telephone Encounter (Signed)
Pt contacted office for refill request on the following medications:  oxyCODONE (OXY IR/ROXICODONE) 5 MG immediate release tablet  [facility] [address]  Pt stated that he is medication is due Sunday and he isn't trying to get the medication early he just wants to make sure he can pick up his medication before he runs out. Please advise. Thanks TNP

## 2018-10-21 ENCOUNTER — Other Ambulatory Visit: Payer: Self-pay | Admitting: *Deleted

## 2018-10-21 MED ORDER — OMEPRAZOLE 20 MG PO CPDR: 20.0000 mg | DELAYED_RELEASE_CAPSULE | Freq: Two times a day (BID) | ORAL | 3 refills | Status: DC

## 2018-11-14 ENCOUNTER — Other Ambulatory Visit: Payer: Self-pay | Admitting: Family Medicine

## 2018-11-14 DIAGNOSIS — M79604 Pain in right leg: Secondary | ICD-10-CM

## 2018-11-14 DIAGNOSIS — R52 Pain, unspecified: Secondary | ICD-10-CM

## 2018-11-14 DIAGNOSIS — L905 Scar conditions and fibrosis of skin: Secondary | ICD-10-CM

## 2018-11-14 NOTE — Telephone Encounter (Signed)
Pt needing a refill on:  oxyCODONE (OXY IR/ROXICODONE) 5 MG immediate release tablet  Please fill at:  [place] [address] [place] [phone] (Phone) [phone] (Fax)   Needs it filled this weekend please!  Thanks, [employer]

## 2018-11-15 MED ORDER — OXYCODONE HCL 5 MG PO TABS: 5.0000 mg | ORAL_TABLET | Freq: Four times a day (QID) | ORAL | 0 refills | Status: DC | PRN

## 2018-11-24 ENCOUNTER — Other Ambulatory Visit: Payer: Self-pay | Admitting: Family Medicine

## 2018-11-24 ENCOUNTER — Telehealth: Payer: Self-pay

## 2018-11-24 DIAGNOSIS — K219 Gastro-esophageal reflux disease without esophagitis: Secondary | ICD-10-CM

## 2018-11-24 NOTE — Telephone Encounter (Signed)
Pt states he spoke with the cardiologist when he was having "chest pain"  The cardiologist suggested he sees a GI Specialist for reflux.   Thanks,   -Mickel Baas

## 2018-11-24 NOTE — Progress Notes (Signed)
Order entered

## 2018-11-24 NOTE — Telephone Encounter (Signed)
Patient is requesting a referral to gastroenterology.

## 2018-12-02 ENCOUNTER — Ambulatory Visit: Payer: Medicare Other | Admitting: Gastroenterology

## 2018-12-09 ENCOUNTER — Other Ambulatory Visit: Payer: Self-pay | Admitting: Family Medicine

## 2018-12-09 DIAGNOSIS — L905 Scar conditions and fibrosis of skin: Secondary | ICD-10-CM

## 2018-12-09 DIAGNOSIS — R52 Pain, unspecified: Secondary | ICD-10-CM

## 2018-12-09 DIAGNOSIS — M79604 Pain in right leg: Secondary | ICD-10-CM

## 2018-12-09 NOTE — Telephone Encounter (Signed)
[facility] faxed refill request for the following medications:  oxyCODONE (OXY IR/ROXICODONE) 5 MG immediate release tablet    Please advise.

## 2018-12-10 NOTE — Telephone Encounter (Signed)
30 days dispensed 8-22. Next dispense due 8-21

## 2018-12-11 DIAGNOSIS — N2581 Secondary hyperparathyroidism of renal origin: Secondary | ICD-10-CM | POA: Insufficient documentation

## 2018-12-11 DIAGNOSIS — I10 Essential (primary) hypertension: Secondary | ICD-10-CM | POA: Insufficient documentation

## 2018-12-11 DIAGNOSIS — Z940 Kidney transplant status: Secondary | ICD-10-CM | POA: Diagnosis not present

## 2018-12-11 DIAGNOSIS — N186 End stage renal disease: Secondary | ICD-10-CM | POA: Diagnosis not present

## 2018-12-12 ENCOUNTER — Other Ambulatory Visit: Payer: Self-pay | Admitting: Cardiovascular Disease

## 2018-12-12 ENCOUNTER — Other Ambulatory Visit: Payer: Self-pay | Admitting: Family Medicine

## 2018-12-12 DIAGNOSIS — N189 Chronic kidney disease, unspecified: Secondary | ICD-10-CM | POA: Diagnosis not present

## 2018-12-12 DIAGNOSIS — E1165 Type 2 diabetes mellitus with hyperglycemia: Secondary | ICD-10-CM | POA: Diagnosis not present

## 2018-12-12 DIAGNOSIS — E1121 Type 2 diabetes mellitus with diabetic nephropathy: Secondary | ICD-10-CM | POA: Diagnosis not present

## 2018-12-12 DIAGNOSIS — I10 Essential (primary) hypertension: Secondary | ICD-10-CM | POA: Diagnosis not present

## 2018-12-12 DIAGNOSIS — N2581 Secondary hyperparathyroidism of renal origin: Secondary | ICD-10-CM | POA: Diagnosis not present

## 2018-12-12 DIAGNOSIS — E08311 Diabetes mellitus due to underlying condition with unspecified diabetic retinopathy with macular edema: Secondary | ICD-10-CM | POA: Diagnosis not present

## 2018-12-12 DIAGNOSIS — E785 Hyperlipidemia, unspecified: Secondary | ICD-10-CM | POA: Diagnosis not present

## 2018-12-12 DIAGNOSIS — E0859 Diabetes mellitus due to underlying condition with other circulatory complications: Secondary | ICD-10-CM | POA: Diagnosis not present

## 2018-12-12 LAB — HEPATIC FUNCTION PANEL
ALT: 16 (ref 10–40)
AST: 14 (ref 14–40)
Alkaline Phosphatase: 95 (ref 25–125)
Bilirubin, Total: 0.7

## 2018-12-12 LAB — HEMOGLOBIN A1C: Hemoglobin A1C: 6.7

## 2018-12-12 LAB — CBC AND DIFFERENTIAL
HCT: 43 (ref 41–53)
Hemoglobin: 14.8 (ref 13.5–17.5)
Platelets: 182 (ref 150–399)
WBC: 6.7

## 2018-12-12 LAB — BASIC METABOLIC PANEL
BUN: 29 — AB (ref 4–21)
Creatinine: 2 — AB (ref 0.6–1.3)
Glucose: 243
Potassium: 4.4 (ref 3.4–5.3)
Sodium: 140 (ref 137–147)

## 2018-12-12 LAB — TSH: TSH: 1.34 (ref 0.41–5.90)

## 2018-12-12 LAB — LIPID PANEL
Cholesterol: 103 (ref 0–200)
HDL: 40 (ref 35–70)
LDL Cholesterol: 45
Triglycerides: 95 (ref 40–160)

## 2018-12-12 NOTE — Telephone Encounter (Signed)
Please review. Thanks!  

## 2018-12-12 NOTE — Telephone Encounter (Signed)
Pt needs a refill on generic Alprazolam 2 mg  [place] [facility]

## 2018-12-13 MED ORDER — ALPRAZOLAM 2 MG PO TABS: 2.0000 mg | ORAL_TABLET | Freq: Three times a day (TID) | ORAL | 3 refills | Status: DC | PRN

## 2018-12-13 MED ORDER — OXYCODONE HCL 5 MG PO TABS: 5.0000 mg | ORAL_TABLET | Freq: Four times a day (QID) | ORAL | 0 refills | Status: DC | PRN

## 2018-12-29 DIAGNOSIS — Z6831 Body mass index (BMI) 31.0-31.9, adult: Secondary | ICD-10-CM | POA: Diagnosis not present

## 2018-12-29 DIAGNOSIS — I7389 Other specified peripheral vascular diseases: Secondary | ICD-10-CM | POA: Diagnosis not present

## 2018-12-29 DIAGNOSIS — N2581 Secondary hyperparathyroidism of renal origin: Secondary | ICD-10-CM | POA: Diagnosis not present

## 2018-12-29 DIAGNOSIS — E1121 Type 2 diabetes mellitus with diabetic nephropathy: Secondary | ICD-10-CM | POA: Diagnosis not present

## 2018-12-29 DIAGNOSIS — E669 Obesity, unspecified: Secondary | ICD-10-CM | POA: Diagnosis not present

## 2018-12-29 DIAGNOSIS — E08311 Diabetes mellitus due to underlying condition with unspecified diabetic retinopathy with macular edema: Secondary | ICD-10-CM | POA: Diagnosis not present

## 2018-12-29 DIAGNOSIS — E1165 Type 2 diabetes mellitus with hyperglycemia: Secondary | ICD-10-CM | POA: Diagnosis not present

## 2018-12-29 DIAGNOSIS — E0859 Diabetes mellitus due to underlying condition with other circulatory complications: Secondary | ICD-10-CM | POA: Diagnosis not present

## 2018-12-29 DIAGNOSIS — N189 Chronic kidney disease, unspecified: Secondary | ICD-10-CM | POA: Diagnosis not present

## 2018-12-29 DIAGNOSIS — E785 Hyperlipidemia, unspecified: Secondary | ICD-10-CM | POA: Diagnosis not present

## 2018-12-29 DIAGNOSIS — E118 Type 2 diabetes mellitus with unspecified complications: Secondary | ICD-10-CM | POA: Diagnosis not present

## 2018-12-29 DIAGNOSIS — I10 Essential (primary) hypertension: Secondary | ICD-10-CM | POA: Diagnosis not present

## 2019-01-05 NOTE — Progress Notes (Signed)
Subjective:   James Moreno is a 53 y.o. male who presents for Medicare Annual/Subsequent preventive examination.    This visit is being conducted through telemedicine due to the COVID-19 pandemic. This patient has given me verbal consent via doximity to conduct this visit, patient states they are participating from their home address. Some vital signs may be absent or patient reported.    Patient identification: identified by name, DOB, and current address  Review of Systems:  N/A  Cardiac Risk Factors include: diabetes mellitus;dyslipidemia;hypertension;male gender;smoking/ tobacco exposure     Objective:    Vitals: There were no vitals taken for this visit.  There is no height or weight on file to calculate BMI. Unable to obtain vitals due to visit being conducted via telephonically.   Advanced Directives 01/06/2019 01/02/2018 10/21/2015 02/09/2015 11/17/2014  Does Patient Have a Medical Advance Directive? No Yes Yes No Yes  Type of Advance Directive - [place];Living will - - Living will;Healthcare Power of Attorney  Does patient want to make changes to medical advance directive? Yes (ED - Information included in AVS) - - - No - Patient declined  Copy of [place] in Chart? - No - copy requested - - No - copy requested    Tobacco Social History   Tobacco Use  Smoking Status Current Some Day Smoker  . Packs/day: 0.25  . Years: 31.00  . Pack years: 7.75  . Types: Cigarettes  Smokeless Tobacco Never Used  Tobacco Comment   1 pack every 3-4 days     Ready to quit: Yes Counseling given: No Comment: 1 pack every 3-4 days   Clinical Intake:  Pre-visit preparation completed: Yes  Pain : 0-10 Pain Score: 2  Pain Type: Chronic pain Pain Location: Leg Pain Orientation: Right Pain Descriptors / Indicators: Aching Pain Frequency: Constant     Nutritional Risks: None Diabetes: Yes  How often do you need to have someone help  you when you read instructions, pamphlets, or other written materials from your doctor or pharmacy?: 1 - Never   Diabetes:  Is the patient diabetic?  Yes type 2 If diabetic, was a CBG obtained today?  No  Did the patient bring in their glucometer from home?  No  How often do you monitor your CBG's? Three times daily.   Financial Strains and Diabetes Management:  Are you having any financial strains with the device, your supplies or your medication? No .  Does the patient want to be seen by Chronic Care Management for management of their diabetes?  No  Would the patient like to be referred to a Nutritionist or for Diabetic Management?  No   Diabetic Exams:  Diabetic Eye Exam: Completed 12/17/17.  Apt scheduled for 01/13/19 with Dr Roosevelt Locks.  Diabetic Foot Exam: Completed 11/14/17. Pt has been advised about the importance in completing this exam.  Note made to follow up on this at next in office apt.    Interpreter Needed?: No  Information entered by :: [facility], LPN  Past Medical History:  Diagnosis Date  . Acute kidney failure, unspecified ([place])   . GERD (gastroesophageal reflux disease)   . History of hepatitis B   . Hypothyroidism   . Mitral valve disorders(424.0)   . Pityriasis 12/27/2014  . Primary pulmonary HTN ([place])   . Tricuspid valve disorders, specified as nonrheumatic    Past Surgical History:  Procedure Laterality Date  . APPENDECTOMY    . Carotid Doppler Ultrasound  06/14/2009   39% stenosis of bilateral internal carotid artery, bilateral anterograde vertebral flow  . EYE SURGERY     right  . HEMORROIDECTOMY    . KIDNEY TRANSPLANT Right 2016  . MECKEL DIVERTICULUM EXCISION     infancy  . Myocardial Perfusion scan  01/17/2009   [facility], non- ischemic. LVEF= 55%  . PARS PLANA VITRECTOMY Left 02/09/2015   Procedure: Pan retinal photocoagulation 53299;  Surgeon: Milus Height, MD;  Location: [facility] ORS;  Service: Ophthalmology;  Laterality: Left;  .  REFRACTIVE SURGERY Left   . sleep study  01/09/2011   Severe sleep apnea. AHI 72.9/hr. RDI=83.0/hr. Desaturation to 69.0% Emergency CPAP titaration to 14.0cm   Family History  Problem Relation Age of Onset  . Hypertension Mother   . Hyperlipidemia Mother   . Melanoma Father    Social History   Socioeconomic History  . Marital status: Married    Spouse name: Not on file  . Number of children: 1  . Years of education: Not on file  . Highest education level: Associate degree: occupational, Hotel manager, or vocational program  Occupational History  . Occupation: Insurance account manager    Comment: on disability  Social Needs  . Financial resource strain: Not hard at all  . Food insecurity    Worry: Never true    Inability: Never true  . Transportation needs    Medical: No    Non-medical: No  Tobacco Use  . Smoking status: Current Some Day Smoker    Packs/day: 0.25    Years: 31.00    Pack years: 7.75    Types: Cigarettes  . Smokeless tobacco: Never Used  . Tobacco comment: 1 pack every 3-4 days  Substance and Sexual Activity  . Alcohol use: No    Alcohol/week: 0.0 standard drinks    Comment: Excessive alcohol consumption in the past. Quit around 2016  . Drug use: No  . Sexual activity: Not on file  Lifestyle  . Physical activity    Days per week: 0 days    Minutes per session: 0 min  . Stress: Not at all  Relationships  . Social Herbalist on phone: Patient refused    Gets together: Patient refused    Attends religious service: Patient refused    Active member of club or organization: Patient refused    Attends meetings of clubs or organizations: Patient refused    Relationship status: Patient refused  Other Topics Concern  . Not on file  Social History Narrative  . Not on file    Outpatient Encounter Medications as of 01/06/2019  Medication Sig  . acetaminophen (TYLENOL) 500 MG tablet Take 500 mg by mouth every 6 (six) hours as needed (headaches.).  Marland Kitchen  alprazolam (XANAX) 2 MG tablet Take 1 tablet (2 mg total) by mouth 3 (three) times daily as needed for sleep.  Marland Kitchen amLODipine (NORVASC) 10 MG tablet Take 10 mg by mouth daily.   . B-D ULTRAFINE III SHORT PEN 31G X 8 MM MISC   . BD INSULIN SYRINGE U/F 31G X 5/16" 1 ML MISC   . Blood Glucose Monitoring Suppl (GLUCOCOM BLOOD GLUCOSE MONITOR) DEVI Frequency:ONCE   Dosage:0.0     Instructions:  Note:Dose: N/A  . carvedilol (COREG) 6.25 MG tablet TAKE 1 TABLET TWICE A DAY WITH MEALS (NEED APPOINTMENT FOR FURTHER REFILLS) (Patient taking differently: Take 12.5 mg by mouth 2 (two) times daily with a meal. )  . cinacalcet (SENSIPAR) 30 MG tablet Take  30 mg by mouth daily.  . fenofibrate (TRICOR) 145 MG tablet Take 145 mg by mouth daily.  Marland Kitchen ibuprofen (ADVIL,MOTRIN) 200 MG tablet Take 200-400 mg by mouth every 8 (eight) hours as needed (for headaches.).  Marland Kitchen insulin aspart (NOVOLOG) 100 UNIT/ML injection Inject 10-16 Units into the skin 3 (three) times daily before meals. Sliding scale  . Insulin Degludec (TRESIBA FLEXTOUCH [place]) Inject 30 Units into the skin daily with lunch.   . Lancets (ONETOUCH DELICA PLUS [id number]) [place]   . losartan (COZAAR) 100 MG tablet Take 100 mg by mouth daily.   . mycophenolate (MYFORTIC) 360 MG TBEC EC tablet Take 360 mg by mouth 2 (two) times daily.  . naloxone (NARCAN) nasal spray 4 mg/0.1 mL Narcan 4 mg/actuation nasal spray  . omeprazole (PRILOSEC) 20 MG capsule Take 1 capsule (20 mg total) by mouth 2 (two) times a day.  Glory Rosebush VERIO test strip   . oxyCODONE (OXY IR/ROXICODONE) 5 MG immediate release tablet Take 1-2 tablets (5-10 mg total) by mouth every 6 (six) hours as needed for severe pain.  . rosuvastatin (CRESTOR) 40 MG tablet Take 1 tablet (40 mg total) by mouth daily.  . sildenafil (VIAGRA) 100 MG tablet TAKE ONE-HALF (1/2) TO ONE TABLET DAILY AS NEEDED FOR ERECTILE DYSFUNCTION  . tacrolimus (PROGRAF) 1 MG capsule Take 3 mg by mouth 2 (two) times daily.   Marland Kitchen  triamcinolone cream (KENALOG) 0.1 % APPLY DAILY TO INFLAMED BUMPS AS NEEDED  . amLODipine (NORVASC) 5 MG tablet Take 5 mg by mouth daily.  Marland Kitchen losartan (COZAAR) 50 MG tablet Take 50 mg by mouth at bedtime.    No facility-administered encounter medications on file as of 01/06/2019.     Activities of Daily Living In your present state of health, do you have any difficulty performing the following activities: 01/06/2019  Hearing? N  Vision? N  Difficulty concentrating or making decisions? N  Walking or climbing stairs? Y  Comment Due to right leg pain.  Dressing or bathing? N  Doing errands, shopping? N  Preparing Food and eating ? N  Using the Toilet? N  In the past six months, have you accidently leaked urine? N  Do you have problems with loss of bowel control? N  Managing your Medications? N  Managing your Finances? N  Housekeeping or managing your Housekeeping? N  Some recent data might be hidden    Patient Care Team: Birdie Sons, MD as PCP - General (Family Medicine) Morayati, Lourdes Sledge, MD as Attending Physician (Endocrinology) Pa, [place] (Optometry) Rockey Situ, Kathlene November, MD as Consulting Physician (Cardiology) Anthonette Legato, MD (Nephrology) Long, Thomes Cake, MD as Referring Physician (Vascular Surgery) Isaias Sakai, MD as Referring Physician (Ophthalmology) Lin Landsman, MD as Consulting Physician (Gastroenterology)   Assessment:   This is a routine wellness examination for James Moreno.  Exercise Activities and Dietary recommendations Current Exercise Habits: Structured exercise class, Type of exercise: walking, Time (Minutes): 30, Frequency (Times/Week): 1, Weekly Exercise (Minutes/Week): 30, Intensity: Mild, Exercise limited by: orthopedic condition(s)  Goals    . Quit Smoking     Recommend to continue efforts to reduce smoking habits until no longer smoking (Smoking Cessation literature attached to AVS).           Fall Risk Fall Risk   01/06/2019 01/02/2018  Falls in the past year? 0 No  Number falls in past yr: 0 -  Injury with Fall? 0 -   FALL RISK PREVENTION PERTAINING TO THE  HOME:  Any stairs in or around the home? Yes  If so, are there any without handrails? No   Home free of loose throw rugs in walkways, pet beds, electrical cords, etc? Yes  Adequate lighting in your home to reduce risk of falls? Yes   ASSISTIVE DEVICES UTILIZED TO PREVENT FALLS:  Life alert? No  Use of a cane, walker or w/c? No  Grab bars in the bathroom? No  Shower chair or bench in shower? Yes  Elevated toilet seat or a handicapped toilet? No    TIMED UP AND GO:  Was the test performed? No .    Depression Screen PHQ 2/9 Scores 01/06/2019 01/02/2018 12/31/2016  PHQ - 2 Score 0 0 0  PHQ- 9 Score - - 0    Cognitive Function     6CIT Screen 01/06/2019  What Year? 0 points  What month? 0 points  What time? 0 points  Count back from 20 0 points  Months in reverse 0 points  Repeat phrase 0 points  Total Score 0    Immunization History  Administered Date(s) Administered  . Influenza Split 12/05/2010  . Influenza,inj,Quad PF,6+ Mos 12/31/2016  . Pneumococcal Polysaccharide-23 10/24/2010  . Tdap 12/05/2010    Qualifies for Shingles Vaccine? Yes . Due for Shingrix. Education has been provided regarding the importance of this vaccine. Pt has been advised to call insurance company to determine out of pocket expense. Advised may also receive vaccine at local pharmacy or Health Dept. Verbalized acceptance and understanding.  Tdap: Up to date  Flu Vaccine: Due for Flu vaccine. Does the patient want to receive this vaccine today?  No .     Screening Tests Health Maintenance  Topic Date Due  . HIV Screening  03/10/1980  . INFLUENZA VACCINE  10/25/2018  . FOOT EXAM  11/15/2018  . OPHTHALMOLOGY EXAM  12/18/2018  . HEMOGLOBIN A1C  06/29/2019  . Fecal DNA (Cologuard)  08/06/2020  . TETANUS/TDAP  12/04/2020  . PNEUMOCOCCAL  POLYSACCHARIDE VACCINE AGE 28-64 HIGH RISK  Completed   Cancer Screenings:  Colorectal Screening: Cologuard completed 08/06/17. Repeat every 3 years.  Lung Cancer Screening: (Low Dose CT Chest recommended if Age 21-80 years, 30 pack-year currently smoking OR have quit w/in 15years.) does not qualify.   Additional Screening:  Dental Screening: Recommended annual dental exams for proper oral hygiene  Community Resource Referral:  CRR required this visit?  No        Plan:  I have personally reviewed and addressed the Medicare Annual Wellness questionnaire and have noted the following in the patient's chart:  A. Medical and social history B. Use of alcohol, tobacco or illicit drugs  C. Current medications and supplements D. Functional ability and status E.  Nutritional status F.  Physical activity G. Advance directives H. List of other physicians I.  Hospitalizations, surgeries, and ER visits in previous 12 months J.  [place] such as hearing and vision if needed, cognitive and depression L. Referrals and appointments   In addition, I have reviewed and discussed with patient certain preventive protocols, quality metrics, and best practice recommendations. A written personalized care plan for preventive services as well as general preventive health recommendations were provided to patient.   Glendora Score, [place]  32/44/0102 Nurse Health Advisor  Nurse Notes: Pt needs a flu shot and diabetic foot exam at next in office visit. Eye exam scheduled for 01/13/19.

## 2019-01-06 ENCOUNTER — Other Ambulatory Visit: Payer: Self-pay

## 2019-01-06 ENCOUNTER — Ambulatory Visit (INDEPENDENT_AMBULATORY_CARE_PROVIDER_SITE_OTHER): Payer: Medicare Other | Admitting: Gastroenterology

## 2019-01-06 ENCOUNTER — Ambulatory Visit (INDEPENDENT_AMBULATORY_CARE_PROVIDER_SITE_OTHER): Payer: Medicare Other

## 2019-01-06 ENCOUNTER — Encounter: Payer: Self-pay | Admitting: Gastroenterology

## 2019-01-06 ENCOUNTER — Encounter

## 2019-01-06 VITALS — BP 139/78 | HR 89 | Temp 98.4°F | Resp 17 | Wt 234.2 lb

## 2019-01-06 DIAGNOSIS — R1310 Dysphagia, unspecified: Secondary | ICD-10-CM | POA: Diagnosis not present

## 2019-01-06 DIAGNOSIS — K219 Gastro-esophageal reflux disease without esophagitis: Secondary | ICD-10-CM

## 2019-01-06 DIAGNOSIS — Z0000 Encounter for general adult medical examination without abnormal findings: Secondary | ICD-10-CM | POA: Diagnosis not present

## 2019-01-06 DIAGNOSIS — R7989 Other specified abnormal findings of blood chemistry: Secondary | ICD-10-CM | POA: Diagnosis not present

## 2019-01-06 MED ORDER — OMEPRAZOLE 40 MG PO CPDR: 40.0000 mg | DELAYED_RELEASE_CAPSULE | Freq: Two times a day (BID) | ORAL | 0 refills | Status: DC

## 2019-01-06 NOTE — Patient Instructions (Signed)
[name] , Thank you for taking time to come for your Medicare Wellness Visit. I appreciate your ongoing commitment to your health goals. Please review the following plan we discussed and let me know if I can assist you in the future.   Screening recommendations/referrals: Colonoscopy: Up to date, cologuard due 08/06/20 Recommended yearly ophthalmology/optometry visit for glaucoma screening and checkup Recommended yearly dental visit for hygiene and checkup  Vaccinations: Influenza vaccine: Currently due Tdap vaccine: Up to date, due 11/2020    Advanced directives: Advance directive discussed with you today. I will mail a copy for you to complete at home and have notarized. Once this is complete please bring a copy in to our office so we can scan it into your chart.  Conditions/risks identified: Smoking cessation discussed today.   Next appointment: 01/19/19 @ 10:00 AM with Dr Caryn Section.   Preventive Care 40-64 Years, Male Preventive care refers to lifestyle choices and visits with your health care provider that can promote health and wellness. What does preventive care include?  A yearly physical exam. This is also called an annual well check.  Dental exams once or twice a year.  Routine eye exams. Ask your health care provider how often you should have your eyes checked.  Personal lifestyle choices, including:  Daily care of your teeth and gums.  Regular physical activity.  Eating a healthy diet.  Avoiding tobacco and drug use.  Limiting alcohol use.  Practicing safe sex.  Taking low-dose aspirin every day starting at age 76. What happens during an annual well check? The services and screenings done by your health care provider during your annual well check will depend on your age, overall health, lifestyle risk factors, and family history of disease. Counseling  Your health care provider may ask you questions about your:  Alcohol use.  Tobacco use.  Drug use.   Emotional well-being.  Home and relationship well-being.  Sexual activity.  Eating habits.  Work and work Statistician. Screening  You may have the following tests or measurements:  Height, weight, and BMI.  Blood pressure.  Lipid and cholesterol levels. These may be checked every 5 years, or more frequently if you are over 56 years old.  Skin check.  Lung cancer screening. You may have this screening every year starting at age 38 if you have a 30-pack-year history of smoking and currently smoke or have quit within the past 15 years.  Fecal occult blood test (FOBT) of the stool. You may have this test every year starting at age 59.  Flexible sigmoidoscopy or colonoscopy. You may have a sigmoidoscopy every 5 years or a colonoscopy every 10 years starting at age 16.  Prostate cancer screening. Recommendations will vary depending on your family history and other risks.  Hepatitis C blood test.  Hepatitis B blood test.  Sexually transmitted disease (STD) testing.  Diabetes screening. This is done by checking your blood sugar (glucose) after you have not eaten for a while (fasting). You may have this done every 1-3 years. Discuss your test results, treatment options, and if necessary, the need for more tests with your health care provider. Vaccines  Your health care provider may recommend certain vaccines, such as:  Influenza vaccine. This is recommended every year.  Tetanus, diphtheria, and acellular pertussis (Tdap, Td) vaccine. You may need a Td booster every 10 years.  Zoster vaccine. You may need this after age 67.  Pneumococcal 13-valent conjugate (PCV13) vaccine. You may need this if you have  certain conditions and have not been vaccinated.  Pneumococcal polysaccharide (PPSV23) vaccine. You may need one or two doses if you smoke cigarettes or if you have certain conditions. Talk to your health care provider about which screenings and vaccines you need and how often  you need them. This information is not intended to replace advice given to you by your health care provider. Make sure you discuss any questions you have with your health care provider. Document Released: 04/08/2015 Document Revised: 11/30/2015 Document Reviewed: 01/11/2015 Elsevier Interactive Patient Education  2017 [place] Prevention in the Home Falls can cause injuries. They can happen to people of all ages. There are many things you can do to make your home safe and to help prevent falls. What can I do on the outside of my home?  Regularly fix the edges of walkways and driveways and fix any cracks.  Remove anything that might make you trip as you walk through a door, such as a raised step or threshold.  Trim any bushes or trees on the path to your home.  Use bright outdoor lighting.  Clear any walking paths of anything that might make someone trip, such as rocks or tools.  Regularly check to see if handrails are loose or broken. Make sure that both sides of any steps have handrails.  Any raised decks and porches should have guardrails on the edges.  Have any leaves, snow, or ice cleared regularly.  Use sand or salt on walking paths during winter.  Clean up any spills in your garage right away. This includes oil or grease spills. What can I do in the bathroom?  Use night lights.  Install grab bars by the toilet and in the tub and shower. Do not use towel bars as grab bars.  Use non-skid mats or decals in the tub or shower.  If you need to sit down in the shower, use a plastic, non-slip stool.  Keep the floor dry. Clean up any water that spills on the floor as soon as it happens.  Remove soap buildup in the tub or shower regularly.  Attach bath mats securely with double-sided non-slip rug tape.  Do not have throw rugs and other things on the floor that can make you trip. What can I do in the bedroom?  Use night lights.  Make sure that you have a light  by your bed that is easy to reach.  Do not use any sheets or blankets that are too big for your bed. They should not hang down onto the floor.  Have a firm chair that has side arms. You can use this for support while you get dressed.  Do not have throw rugs and other things on the floor that can make you trip. What can I do in the kitchen?  Clean up any spills right away.  Avoid walking on wet floors.  Keep items that you use a lot in easy-to-reach places.  If you need to reach something above you, use a strong step stool that has a grab bar.  Keep electrical cords out of the way.  Do not use floor polish or wax that makes floors slippery. If you must use wax, use non-skid floor wax.  Do not have throw rugs and other things on the floor that can make you trip. What can I do with my stairs?  Do not leave any items on the stairs.  Make sure that there are handrails on both sides of  the stairs and use them. Fix handrails that are broken or loose. Make sure that handrails are as long as the stairways.  Check any carpeting to make sure that it is firmly attached to the stairs. Fix any carpet that is loose or worn.  Avoid having throw rugs at the top or bottom of the stairs. If you do have throw rugs, attach them to the floor with carpet tape.  Make sure that you have a light switch at the top of the stairs and the bottom of the stairs. If you do not have them, ask someone to add them for you. What else can I do to help prevent falls?  Wear shoes that:  Do not have high heels.  Have rubber bottoms.  Are comfortable and fit you well.  Are closed at the toe. Do not wear sandals.  If you use a stepladder:  Make sure that it is fully opened. Do not climb a closed stepladder.  Make sure that both sides of the stepladder are locked into place.  Ask someone to hold it for you, if possible.  Clearly mark and make sure that you can see:  Any grab bars or handrails.  First  and last steps.  Where the edge of each step is.  Use tools that help you move around (mobility aids) if they are needed. These include:  Canes.  Walkers.  Scooters.  Crutches.  Turn on the lights when you go into a dark area. Replace any light bulbs as soon as they burn out.  Set up your furniture so you have a clear path. Avoid moving your furniture around.  If any of your floors are uneven, fix them.  If there are any pets around you, be aware of where they are.  Review your medicines with your doctor. Some medicines can make you feel dizzy. This can increase your chance of falling. Ask your doctor what other things that you can do to help prevent falls. This information is not intended to replace advice given to you by your health care provider. Make sure you discuss any questions you have with your health care provider. Document Released: 01/06/2009 Document Revised: 08/18/2015 Document Reviewed: 04/16/2014 Elsevier Interactive Patient Education  2017 [employer].

## 2019-01-06 NOTE — Progress Notes (Signed)
Cephas Darby, MD [address]  [place]  [place], [place] [address]  Main: [phone]  Fax: [phone]    Gastroenterology Consultation  Referring Provider:     Birdie Sons, MD Primary Care Physician:  Birdie Sons, MD Primary Gastroenterologist:  Dr. Cephas Darby Reason for Consultation:   Reflux, dysphagia        HPI:   James Moreno is a 53 y.o. male referred by Dr. Birdie Sons, MD  for consultation & management of reflux, dysphagia.  Patient has history of metabolic syndrome, status post kidney transplant in 2016, secondary to diabetic nephropathy resulting in end-stage renal failure.  Patient reports for last 5 to 6 months, he has been experiencing significant regurgitation, heartburn, food not passing down in his mid chest.  He felt severe chest pressure, evaluated by cardiology as he was concerned about heart disease and it was ruled out.  He started on omeprazole 20 mg twice daily which seems to alleviate symptoms partially.  He also reports nocturnal heartburn, waking up with coughing spells and choking episodes.  Reflux symptoms worse if his last meal is after 6 PM.  He continues to smoke cigarettes, 1 pack last for 2 days.  Alcohol occasionally.  He is requesting upper endoscopy for further evaluation Patient was also evaluated by bariatric surgery team at [facility] for sleeve gastrectomy and currently under work-up today.  He lost about 9 pounds with diet modification  Patient underwent Cologuard testing 2019 and it was negative.  He prefers not to undergo colonoscopy for colon cancer screening History of hep B, spontaneously cleared NSAIDs: None  Antiplts/Anticoagulants/Anti thrombotics: None  GI Procedures: None He denies family history of GI malignancy  Past Medical History:  Diagnosis Date  . Acute kidney failure, unspecified ([place])   . GERD (gastroesophageal reflux disease)   . History of hepatitis B   . Hypothyroidism   . Mitral valve  disorders(424.0)   . Pityriasis 12/27/2014  . Primary pulmonary HTN ([place])   . Tricuspid valve disorders, specified as nonrheumatic     Past Surgical History:  Procedure Laterality Date  . APPENDECTOMY    . Carotid Doppler Ultrasound  06/14/2009   39% stenosis of bilateral internal carotid artery, bilateral anterograde vertebral flow  . EYE SURGERY     right  . HEMORROIDECTOMY    . KIDNEY TRANSPLANT Right 2016  . MECKEL DIVERTICULUM EXCISION     infancy  . Myocardial Perfusion scan  01/17/2009   [facility], non- ischemic. LVEF= 55%  . PARS PLANA VITRECTOMY Left 02/09/2015   Procedure: Pan retinal photocoagulation 14431;  Surgeon: Milus Height, MD;  Location: [facility] ORS;  Service: Ophthalmology;  Laterality: Left;  . REFRACTIVE SURGERY Left   . sleep study  01/09/2011   Severe sleep apnea. AHI 72.9/hr. RDI=83.0/hr. Desaturation to 69.0% Emergency CPAP titaration to 14.0cm    Current Outpatient Medications:  .  acetaminophen (TYLENOL) 500 MG tablet, Take 500 mg by mouth every 6 (six) hours as needed (headaches.)., Disp: , Rfl:  .  alprazolam (XANAX) 2 MG tablet, Take 1 tablet (2 mg total) by mouth 3 (three) times daily as needed for sleep., Disp: 90 tablet, Rfl: 3 .  amLODipine (NORVASC) 10 MG tablet, Take 10 mg by mouth daily. , Disp: , Rfl:  .  B-D ULTRAFINE III SHORT PEN 31G X 8 MM MISC, , Disp: , Rfl:  .  BD INSULIN SYRINGE U/F 31G X 5/16" 1 ML MISC, , Disp: ,  Rfl:  .  Blood Glucose Monitoring Suppl (GLUCOCOM BLOOD GLUCOSE MONITOR) DEVI, Frequency:ONCE   Dosage:0.0     Instructions:  Note:Dose: N/A, Disp: , Rfl:  .  carvedilol (COREG) 6.25 MG tablet, TAKE 1 TABLET TWICE A DAY WITH MEALS (NEED APPOINTMENT FOR FURTHER REFILLS) (Patient taking differently: Take 12.5 mg by mouth 2 (two) times daily with a meal. ), Disp: 180 tablet, Rfl: 1 .  cinacalcet (SENSIPAR) 30 MG tablet, Take 30 mg by mouth daily., Disp: , Rfl:  .  fenofibrate (TRICOR) 145 MG tablet, Take 145 mg by mouth  daily., Disp: , Rfl:  .  ibuprofen (ADVIL,MOTRIN) 200 MG tablet, Take 200-400 mg by mouth every 8 (eight) hours as needed (for headaches.)., Disp: , Rfl:  .  insulin aspart (NOVOLOG) 100 UNIT/ML injection, Inject 10-16 Units into the skin 3 (three) times daily before meals. Sliding scale, Disp: , Rfl:  .  Insulin Degludec (TRESIBA FLEXTOUCH [place]), Inject 30 Units into the skin daily with lunch. , Disp: , Rfl:  .  Lancets (ONETOUCH DELICA PLUS [id number]) MISC, , Disp: , Rfl:  .  losartan (COZAAR) 100 MG tablet, Take 100 mg by mouth daily. , Disp: , Rfl:  .  mycophenolate (MYFORTIC) 360 MG TBEC EC tablet, Take 360 mg by mouth 2 (two) times daily., Disp: , Rfl:  .  naloxone (NARCAN) nasal spray 4 mg/0.1 mL, Narcan 4 mg/actuation nasal spray, Disp: , Rfl:  .  omeprazole (PRILOSEC) 20 MG capsule, Take 1 capsule (20 mg total) by mouth 2 (two) times a day., Disp: 180 capsule, Rfl: 3 .  ONETOUCH VERIO test strip, , Disp: , Rfl:  .  oxyCODONE (OXY IR/ROXICODONE) 5 MG immediate release tablet, Take 1-2 tablets (5-10 mg total) by mouth every 6 (six) hours as needed for severe pain., Disp: 240 tablet, Rfl: 0 .  rosuvastatin (CRESTOR) 40 MG tablet, Take 1 tablet (40 mg total) by mouth daily., Disp: 90 tablet, Rfl: 2 .  sildenafil (VIAGRA) 100 MG tablet, TAKE ONE-HALF (1/2) TO ONE TABLET DAILY AS NEEDED FOR ERECTILE DYSFUNCTION, Disp: 30 tablet, Rfl: 3 .  tacrolimus (PROGRAF) 1 MG capsule, Take 3 mg by mouth 2 (two) times daily. , Disp: , Rfl: 11 .  triamcinolone cream (KENALOG) 0.1 %, APPLY DAILY TO INFLAMED BUMPS AS NEEDED, Disp: , Rfl:  .  amLODipine (NORVASC) 5 MG tablet, Take 5 mg by mouth daily., Disp: , Rfl:  .  aspirin EC 81 MG tablet, Take by mouth., Disp: , Rfl:  .  losartan (COZAAR) 50 MG tablet, Take 50 mg by mouth at bedtime. , Disp: , Rfl: 11 .  lovastatin (MEVACOR) 40 MG tablet, lovastatin 40 mg tablet, Disp: , Rfl:  .  nicotine (NICODERM CQ - DOSED IN MG/24 HOURS) 14 mg/24hr patch, Apply one patch  daily x 8 weeks., Disp: , Rfl:  .  varenicline (CHANTIX) 0.5 MG tablet, Take 1 tablet by mouth twice daily, Disp: , Rfl:    Family History  Problem Relation Age of Onset  . Hypertension Mother   . Hyperlipidemia Mother   . Melanoma Father      Social History   Tobacco Use  . Smoking status: Current Some Day Smoker    Packs/day: 0.25    Years: 31.00    Pack years: 7.75    Types: Cigarettes  . Smokeless tobacco: Never Used  . Tobacco comment: 1 pack every 3-4 days  Substance Use Topics  . Alcohol use: No    Alcohol/week: 0.0  standard drinks    Comment: Excessive alcohol consumption in the past. Quit around 2016  . Drug use: No    Allergies as of 01/06/2019 - Review Complete 01/06/2019  Allergen Reaction Noted  . No known allergies  05/21/2018    Review of Systems:    All systems reviewed and negative except where noted in HPI.   Physical Exam:  BP 139/78 (BP Location: Left Arm, Patient Position: Sitting, Cuff Size: Large)   Pulse 89   Temp 98.4 F (36.9 C)   Resp 17   Wt 234 lb 3.2 oz (106.2 kg)   BMI 32.66 kg/m  No LMP for male patient.  General:   Alert,  Well-developed, well-nourished, pleasant and cooperative in NAD Head:  Normocephalic and atraumatic. Eyes:  Sclera clear, no icterus.   Conjunctiva pink. Ears:  Normal auditory acuity. Nose:  No deformity, discharge, or lesions. Mouth:  No deformity or lesions,oropharynx pink & moist. Neck:  Supple; no masses or thyromegaly. Lungs:  Respirations even and unlabored.  Clear throughout to auscultation.   No wheezes, crackles, or rhonchi. No acute distress. Heart:  Regular rate and rhythm; no murmurs, clicks, rubs, or gallops. Abdomen:  Normal bowel sounds. Soft, obese, non-tender and non-distended without masses, hepatosplenomegaly or hernias noted.  No guarding or rebound tenderness.   Rectal: Not performed Msk:  Symmetrical without gross deformities. Good, equal movement & strength bilaterally. Pulses:   Normal pulses noted. Extremities:  No clubbing or edema.  No cyanosis. Neurologic:  Alert and oriented x3;  grossly normal neurologically. Skin:  Intact without significant lesions or rashes. No jaundice. Psych:  Alert and cooperative. Normal mood and affect.  Imaging Studies: No recent abdominal imaging  Assessment and Plan:   SUHAIL PELOQUIN is a 53 y.o. male with history of metabolic syndrome, diabetes on insulin, status post renal transplant on immunosuppression seen in consultation for chronic GERD and dysphagia, currently in partial remission on omeprazole 20 mg twice daily  Chronic GERD and dysphagia Increase omeprazole to 40 mg twice daily before meals Continue antireflux lifestyle modification Recommend EGD for further evaluation  History of thrombocytopenia, elevated LFTs Recommend right upper quadrant ultrasound to evaluate for fatty liver as he is at high risk from underlying metabolic syndrome   Follow up in 6 to 8 weeks   Cephas Darby, MD

## 2019-01-09 ENCOUNTER — Telehealth: Payer: Self-pay | Admitting: *Deleted

## 2019-01-09 ENCOUNTER — Ambulatory Visit: Payer: Medicare Other | Attending: Gastroenterology

## 2019-01-09 NOTE — Telephone Encounter (Signed)
[place] dept called to report that patient was a no show for his abdominal US this morning, I called back to let them know that this is not a patient of the cancer center and to call Dr Marius Ditch who ordered this exam

## 2019-01-12 ENCOUNTER — Telehealth: Payer: Self-pay | Admitting: Gastroenterology

## 2019-01-12 ENCOUNTER — Other Ambulatory Visit: Payer: Self-pay | Admitting: Family Medicine

## 2019-01-12 DIAGNOSIS — R52 Pain, unspecified: Secondary | ICD-10-CM

## 2019-01-12 DIAGNOSIS — M79604 Pain in right leg: Secondary | ICD-10-CM

## 2019-01-12 DIAGNOSIS — L905 Scar conditions and fibrosis of skin: Secondary | ICD-10-CM

## 2019-01-12 MED ORDER — OXYCODONE HCL 5 MG PO TABS: 5.0000 mg | ORAL_TABLET | Freq: Four times a day (QID) | ORAL | 0 refills | Status: DC | PRN

## 2019-01-12 NOTE — Telephone Encounter (Signed)
Pt needing a refill on: ° °oxyCODONE (OXY IR/ROXICODONE) 5 MG immediate release tablet ° °Please fill at: °[facility] - [place], [place] - [address] [phone] (Phone) °[phone] (Fax)  ° °Thanks, °[facility] ° ° ° °

## 2019-01-12 NOTE — Telephone Encounter (Signed)
LVM asking pt to please call and reschedule appt

## 2019-01-12 NOTE — Telephone Encounter (Signed)
James Moreno from [facility] health Ultrasound dept. Left vm pt no showed his ultrasound  There cb [phone]

## 2019-01-14 ENCOUNTER — Other Ambulatory Visit: Payer: Self-pay

## 2019-01-14 ENCOUNTER — Encounter: Payer: Self-pay | Admitting: *Deleted

## 2019-01-16 ENCOUNTER — Other Ambulatory Visit
Admission: RE | Admit: 2019-01-16 | Discharge: 2019-01-16 | Disposition: A | Discharge status: Home / Self Care | Payer: Medicare Other | Source: Ambulatory Visit | Attending: Gastroenterology | Admitting: Gastroenterology

## 2019-01-16 ENCOUNTER — Telehealth: Payer: Self-pay | Admitting: Gastroenterology

## 2019-01-16 DIAGNOSIS — Z20828 Contact with and (suspected) exposure to other viral communicable diseases: Secondary | ICD-10-CM | POA: Diagnosis not present

## 2019-01-16 DIAGNOSIS — Z01812 Encounter for preprocedural laboratory examination: Secondary | ICD-10-CM | POA: Insufficient documentation

## 2019-01-16 LAB — SARS CORONAVIRUS 2 (TAT 6-24 HRS): SARS Coronavirus 2: NEGATIVE

## 2019-01-16 NOTE — Telephone Encounter (Signed)
Patient called & l/m  On v/m stating he missed an u/s last week & need to r/s. He also had his covid test today  for his procedure and has questions. Please call

## 2019-01-19 ENCOUNTER — Encounter: Payer: Self-pay | Admitting: Family Medicine

## 2019-01-19 ENCOUNTER — Ambulatory Visit (INDEPENDENT_AMBULATORY_CARE_PROVIDER_SITE_OTHER): Payer: Medicare Other | Admitting: Family Medicine

## 2019-01-19 ENCOUNTER — Other Ambulatory Visit: Payer: Self-pay

## 2019-01-19 VITALS — BP 158/82 | HR 88 | Temp 97.3°F | Wt 230.2 lb

## 2019-01-19 DIAGNOSIS — Z940 Kidney transplant status: Secondary | ICD-10-CM

## 2019-01-19 DIAGNOSIS — E782 Mixed hyperlipidemia: Secondary | ICD-10-CM | POA: Diagnosis not present

## 2019-01-19 DIAGNOSIS — F1190 Opioid use, unspecified, uncomplicated: Secondary | ICD-10-CM

## 2019-01-19 DIAGNOSIS — I739 Peripheral vascular disease, unspecified: Secondary | ICD-10-CM

## 2019-01-19 DIAGNOSIS — N2581 Secondary hyperparathyroidism of renal origin: Secondary | ICD-10-CM

## 2019-01-19 DIAGNOSIS — E1121 Type 2 diabetes mellitus with diabetic nephropathy: Secondary | ICD-10-CM

## 2019-01-19 DIAGNOSIS — I151 Hypertension secondary to other renal disorders: Secondary | ICD-10-CM

## 2019-01-19 DIAGNOSIS — L97909 Non-pressure chronic ulcer of unspecified part of unspecified lower leg with unspecified severity: Secondary | ICD-10-CM

## 2019-01-19 DIAGNOSIS — F17200 Nicotine dependence, unspecified, uncomplicated: Secondary | ICD-10-CM

## 2019-01-19 DIAGNOSIS — Z0283 Encounter for blood-alcohol and blood-drug test: Secondary | ICD-10-CM

## 2019-01-19 DIAGNOSIS — N2889 Other specified disorders of kidney and ureter: Secondary | ICD-10-CM

## 2019-01-19 DIAGNOSIS — I70299 Other atherosclerosis of native arteries of extremities, unspecified extremity: Secondary | ICD-10-CM

## 2019-01-19 DIAGNOSIS — I70234 Atherosclerosis of native arteries of right leg with ulceration of heel and midfoot: Secondary | ICD-10-CM

## 2019-01-19 DIAGNOSIS — R52 Pain, unspecified: Secondary | ICD-10-CM

## 2019-01-19 DIAGNOSIS — L905 Scar conditions and fibrosis of skin: Secondary | ICD-10-CM

## 2019-01-19 NOTE — Telephone Encounter (Signed)
Spoke with pt and provided number to central scheduling to reschedule [place].

## 2019-01-19 NOTE — Progress Notes (Signed)
Patient: James Moreno Male    DOB: 03-Sep-1965   53 y.o.   MRN: [id number] Visit Date: 01/19/2019  Today's Provider: Lelon Huh, MD   Chief Complaint  Patient presents with  . Hypertension  . Hyperlipidemia  . Chronic pain   Subjective:     HPI  Hypertension, follow-up:  BP Readings from Last 3 Encounters:  01/06/19 139/78  01/02/18 130/70  01/02/18 (!) 148/74    He was last seen for hypertension 1 years ago.  BP at that visit was 148/74. Management since that visit includes no changes. Patient was advised to continue current medications and continue follow up nephrology. He reports good compliance with treatment. He is not having side effects.  He is exercising. He is adherent to low salt diet.   Outside blood pressures are being checked at home. He is experiencing none.  Patient denies chest pain, chest pressure/discomfort, claudication, dyspnea, exertional chest pressure/discomfort, fatigue, irregular heart beat, lower extremity edema, near-syncope, orthopnea, palpitations, paroxysmal nocturnal dyspnea, syncope and tachypnea.   Cardiovascular risk factors include diabetes mellitus, dyslipidemia, hypertension, male gender and obesity (BMI >= 30 kg/m2).  Use of agents associated with hypertension: none.     Weight trend: stable Wt Readings from Last 3 Encounters:  01/06/19 234 lb 3.2 oz (106.2 kg)  01/02/18 238 lb 9.6 oz (108.2 kg)  10/30/17 237 lb 12.8 oz (107.9 kg)    Current diet: low carb  ------------------------------------------------------------------------  Lipid/Cholesterol, Follow-up:   Last seen for this1 years ago.  Management changes since that visit include none. . Last Lipid Panel:    Component Value Date/Time   CHOL 166 01/10/2018 0821   TRIG 296 (H) 01/10/2018 0821   HDL 30 (L) 01/10/2018 0821   CHOLHDL 5.5 (H) 01/10/2018 0821   LDLCALC 77 01/10/2018 0821    Risk factors for vascular disease include diabetes mellitus,  hypercholesterolemia and hypertension  He reports good compliance with treatment. He is not having side effects.  Current symptoms include none  Weight trend: stable Prior visit with dietician: no Current diet: low carb Current exercise: walking  Wt Readings from Last 3 Encounters:  01/06/19 234 lb 3.2 oz (106.2 kg)  01/02/18 238 lb 9.6 oz (108.2 kg)  10/30/17 237 lb 12.8 oz (107.9 kg)   He states he just had labs done by Dr. Carmela Rima earlier this month, but does not know if lipids were checked. He states his A1c was very good, around the 6 range.  -------------------------------------------------------------------  Follow up for Chronic pain:  The patient was last seen for this 1 years ago. Changes made at last visit include none.  He reports good compliance with treatment. He feels that condition is controlled with medication. He is not having side effects.   ------------------------------------------------------------------------------------  Allergies  Allergen Reactions  . No Known Allergies      Current Outpatient Medications:  .  acetaminophen (TYLENOL) 500 MG tablet, Take 500 mg by mouth every 6 (six) hours as needed (headaches.)., Disp: , Rfl:  .  alprazolam (XANAX) 2 MG tablet, Take 1 tablet (2 mg total) by mouth 3 (three) times daily as needed for sleep., Disp: 90 tablet, Rfl: 3 .  amLODipine (NORVASC) 10 MG tablet, Take 10 mg by mouth daily. , Disp: , Rfl:  .  B-D ULTRAFINE III SHORT PEN 31G X 8 MM MISC, , Disp: , Rfl:  .  BD INSULIN SYRINGE U/F 31G X 5/16" 1 ML MISC, , Disp: , Rfl:  .  Blood Glucose Monitoring Suppl (GLUCOCOM BLOOD GLUCOSE MONITOR) DEVI, Frequency:ONCE   Dosage:0.0     Instructions:  Note:Dose: N/A, Disp: , Rfl:  .  carvedilol (COREG) 6.25 MG tablet, TAKE 1 TABLET TWICE A DAY WITH MEALS (NEED APPOINTMENT FOR FURTHER REFILLS) (Patient taking differently: Take 12.5 mg by mouth 2 (two) times daily with a meal. ), Disp: 180 tablet, Rfl: 1 .   cinacalcet (SENSIPAR) 30 MG tablet, Take 30 mg by mouth daily., Disp: , Rfl:  .  fenofibrate (TRICOR) 145 MG tablet, Take 145 mg by mouth daily., Disp: , Rfl:  .  ibuprofen (ADVIL,MOTRIN) 200 MG tablet, Take 200-400 mg by mouth every 8 (eight) hours as needed (for headaches.)., Disp: , Rfl:  .  insulin aspart (NOVOLOG) 100 UNIT/ML injection, Inject 10-16 Units into the skin 3 (three) times daily before meals. Sliding scale (01/14/19 - Pt uses 30 units TID with meals, plus SS of 2 units per 50 over 150), Disp: , Rfl:  .  Insulin Degludec (TRESIBA FLEXTOUCH ), Inject 25 Units into the skin daily with lunch. , Disp: , Rfl:  .  Lancets (ONETOUCH DELICA PLUS [id number]) MISC, , Disp: , Rfl:  .  losartan (COZAAR) 50 MG tablet, Take 50 mg by mouth at bedtime. , Disp: , Rfl: 11 .  mycophenolate (MYFORTIC) 360 MG TBEC EC tablet, Take 720 mg by mouth 2 (two) times daily. , Disp: , Rfl:  .  naloxone (NARCAN) nasal spray 4 mg/0.1 mL, Narcan 4 mg/actuation nasal spray, Disp: , Rfl:  .  omeprazole (PRILOSEC) 40 MG capsule, Take 1 capsule (40 mg total) by mouth 2 (two) times daily. (Patient taking differently: Take 80 mg by mouth 2 (two) times daily. ), Disp: 180 capsule, Rfl: 0 .  ONETOUCH VERIO test strip, , Disp: , Rfl:  .  oxyCODONE (OXY IR/ROXICODONE) 5 MG immediate release tablet, Take 1-2 tablets (5-10 mg total) by mouth every 6 (six) hours as needed for severe pain., Disp: 240 tablet, Rfl: 0 .  sildenafil (VIAGRA) 100 MG tablet, TAKE ONE-HALF (1/2) TO ONE TABLET DAILY AS NEEDED FOR ERECTILE DYSFUNCTION, Disp: 30 tablet, Rfl: 3 .  tacrolimus (PROGRAF) 1 MG capsule, Take 3 mg by mouth 2 (two) times daily. , Disp: , Rfl: 11 .  triamcinolone cream (KENALOG) 0.1 %, APPLY DAILY TO INFLAMED BUMPS AS NEEDED, Disp: , Rfl:  .  rosuvastatin (CRESTOR) 40 MG tablet, Take 1 tablet (40 mg total) by mouth daily., Disp: 90 tablet, Rfl: 2 .  varenicline (CHANTIX) 0.5 MG tablet, Take 1 tablet by mouth twice daily, Disp: ,  Rfl:   Review of Systems  Constitutional: Negative for appetite change, chills and fever.  Respiratory: Negative for chest tightness, shortness of breath and wheezing.   Cardiovascular: Negative for chest pain and palpitations.  Gastrointestinal: Negative for abdominal pain, nausea and vomiting.    Social History   Tobacco Use  . Smoking status: Current Every Day Smoker    Packs/day: 1.00    Years: 38.00    Pack years: 38.00    Types: Cigarettes  . Smokeless tobacco: Never Used  . Tobacco comment: since age 66.  Substance Use Topics  . Alcohol use: Not on file    Comment: Excessive alcohol consumption in the past. Quit around 2016      Objective:   BP (!) 158/82 (BP Location: Right Arm, Patient Position: Sitting, Cuff Size: Large)   Pulse 88   Temp (!) 97.3 F (36.3 C) (Temporal)   Wt  230 lb 3.2 oz (104.4 kg)   SpO2 98%   BMI 32.11 kg/m    Physical Exam   General Appearance:    Overweight male in no acute distress  Eyes:    PERRL, conjunctiva/corneas clear, EOM's intact       Lungs:     Clear to auscultation bilaterally, respirations unlabored  Heart:    Normal heart rate. Normal rhythm. No murmurs, rubs, or gallops.   MS:   All extremities are intact.   Neurologic:   Awake, alert, oriented x 3. No apparent focal neurological           defect.           Assessment & Plan    1. Mixed hyperlipidemia He is tolerating rosuvastatin well with no adverse effects.  Check with dr Barkley Boards office to see if lipids were checked recently  2. Type 2 diabetes mellitus with diabetic nephropathy, without long-term current use of insulin ([place]) Get copy of most recent a1c from dr. Lenis Noon.   3. Hypertension secondary to other renal disorders Well controlled.    4. Chronic, continuous use of opioids Doing well with current pain medication regiment.  - Pain Mgt Scrn (14 Drugs), Ur  5. Pain in surgical scar  - Pain Mgt Scrn (14 Drugs), Ur  6. Chronic narcotic use    7. Encounter for drug screening  - Pain Mgt Scrn (14 Drugs), Ur  8. Atherosclerosis of native artery of right lower extremity with ulceration of heel (HCC) Asymptomatic. Compliant with medication.  Continue aggressive risk factor modification.    9. PAD (peripheral artery disease) (HCC) Asymptomatic. Compliant with medication.  Continue aggressive risk factor modification.    10. Secondary hyperparathyroidism of renal origin ([place]) Continue routine follow up Dr. Holley Raring. Last PTH was 228 01/20/2018 11. Compulsive tobacco user syndrome He did tolerate the Chantix, but states  He is going to try to quit smoking cold turker   12. Renal transplant, status post   Other orders - Flu Vaccine QUAD 6+ mos PF IM (Fluarix Quad PF)  The entirety of the information documented in the History of Present Illness, Review of Systems and Physical Exam were personally obtained by me. Portions of this information were initially documented by Meyer Cory, CMA and reviewed by me for thoroughness and accuracy.      Lelon Huh, MD  [facility]

## 2019-01-19 NOTE — Patient Instructions (Signed)
.   Please review the attached list of medications and notify my office if there are any errors.   . Please bring all of your medications to every appointment so we can make sure that our medication list is the same as yours.   . It is especially important to get the annual flu vaccine this year. If you haven't had it already, please go to your pharmacy or call the office as soon as possible to schedule you flu shot.  

## 2019-01-20 LAB — PAIN MGT SCRN (14 DRUGS), UR
Amphetamine Scrn, Ur: NEGATIVE ng/mL
BARBITURATE SCREEN URINE: NEGATIVE ng/mL
BENZODIAZEPINE SCREEN, URINE: NEGATIVE ng/mL
Buprenorphine, Urine: NEGATIVE ng/mL
CANNABINOIDS UR QL SCN: NEGATIVE ng/mL
Cocaine (Metab) Scrn, Ur: NEGATIVE ng/mL
Creatinine(Crt), U: 83.5 mg/dL (ref 20.0–300.0)
Fentanyl, Urine: NEGATIVE pg/mL
Meperidine Screen, Urine: NEGATIVE ng/mL
Methadone Screen, Urine: NEGATIVE ng/mL
OXYCODONE+OXYMORPHONE UR QL SCN: POSITIVE ng/mL — AB
Opiate Scrn, Ur: POSITIVE ng/mL — AB
Ph of Urine: 6.3 (ref 4.5–8.9)
Phencyclidine Qn, Ur: NEGATIVE ng/mL
Propoxyphene Scrn, Ur: NEGATIVE ng/mL
Tramadol Screen, Urine: NEGATIVE ng/mL

## 2019-01-20 NOTE — Discharge Instructions (Signed)

## 2019-01-21 ENCOUNTER — Ambulatory Visit
Admission: RE | Admit: 2019-01-21 | Discharge: 2019-01-21 | Disposition: A | Discharge status: Home / Self Care | Payer: Medicare Other | Attending: Gastroenterology | Admitting: Gastroenterology

## 2019-01-21 ENCOUNTER — Encounter
Admission: RE | Disposition: A | Discharge status: Home / Self Care | Payer: Self-pay | Source: Home / Self Care | Attending: Gastroenterology

## 2019-01-21 ENCOUNTER — Other Ambulatory Visit: Payer: Self-pay

## 2019-01-21 ENCOUNTER — Ambulatory Visit: Payer: Medicare Other | Admitting: Anesthesiology

## 2019-01-21 DIAGNOSIS — K317 Polyp of stomach and duodenum: Secondary | ICD-10-CM | POA: Insufficient documentation

## 2019-01-21 DIAGNOSIS — F17210 Nicotine dependence, cigarettes, uncomplicated: Secondary | ICD-10-CM | POA: Diagnosis not present

## 2019-01-21 DIAGNOSIS — K219 Gastro-esophageal reflux disease without esophagitis: Secondary | ICD-10-CM

## 2019-01-21 DIAGNOSIS — Z794 Long term (current) use of insulin: Secondary | ICD-10-CM | POA: Diagnosis not present

## 2019-01-21 DIAGNOSIS — R1310 Dysphagia, unspecified: Secondary | ICD-10-CM | POA: Diagnosis not present

## 2019-01-21 DIAGNOSIS — I739 Peripheral vascular disease, unspecified: Secondary | ICD-10-CM | POA: Insufficient documentation

## 2019-01-21 DIAGNOSIS — I10 Essential (primary) hypertension: Secondary | ICD-10-CM | POA: Insufficient documentation

## 2019-01-21 DIAGNOSIS — G4730 Sleep apnea, unspecified: Secondary | ICD-10-CM | POA: Insufficient documentation

## 2019-01-21 DIAGNOSIS — Z79899 Other long term (current) drug therapy: Secondary | ICD-10-CM | POA: Insufficient documentation

## 2019-01-21 DIAGNOSIS — F419 Anxiety disorder, unspecified: Secondary | ICD-10-CM | POA: Insufficient documentation

## 2019-01-21 DIAGNOSIS — E119 Type 2 diabetes mellitus without complications: Secondary | ICD-10-CM | POA: Insufficient documentation

## 2019-01-21 DIAGNOSIS — K2970 Gastritis, unspecified, without bleeding: Secondary | ICD-10-CM | POA: Diagnosis not present

## 2019-01-21 DIAGNOSIS — K2100 Gastro-esophageal reflux disease with esophagitis, without bleeding: Secondary | ICD-10-CM | POA: Diagnosis not present

## 2019-01-21 DIAGNOSIS — Z940 Kidney transplant status: Secondary | ICD-10-CM | POA: Diagnosis not present

## 2019-01-21 DIAGNOSIS — K319 Disease of stomach and duodenum, unspecified: Secondary | ICD-10-CM | POA: Insufficient documentation

## 2019-01-21 DIAGNOSIS — K3189 Other diseases of stomach and duodenum: Secondary | ICD-10-CM

## 2019-01-21 DIAGNOSIS — R1319 Other dysphagia: Secondary | ICD-10-CM

## 2019-01-21 DIAGNOSIS — R1314 Dysphagia, pharyngoesophageal phase: Secondary | ICD-10-CM

## 2019-01-21 DIAGNOSIS — K228 Other specified diseases of esophagus: Secondary | ICD-10-CM | POA: Diagnosis not present

## 2019-01-21 HISTORY — DX: Type 2 diabetes mellitus without complications: E11.9

## 2019-01-21 HISTORY — PX: ESOPHAGOGASTRODUODENOSCOPY (EGD) WITH PROPOFOL: SHX5813

## 2019-01-21 HISTORY — DX: Anxiety disorder, unspecified: F41.9

## 2019-01-21 LAB — GLUCOSE, CAPILLARY
Glucose-Capillary: 80 mg/dL (ref 70–99)
Glucose-Capillary: 73 mg/dL (ref 70–99)

## 2019-01-21 SURGERY — ESOPHAGOGASTRODUODENOSCOPY (EGD) WITH PROPOFOL
Anesthesia: General

## 2019-01-21 MED ORDER — LIDOCAINE HCL (CARDIAC) PF 100 MG/5ML IV SOSY
PREFILLED_SYRINGE | INTRAVENOUS | Status: DC | PRN
  Administered 2019-01-21: 50 mg via INTRAVENOUS

## 2019-01-21 MED ORDER — LACTATED RINGERS IV SOLN
INTRAVENOUS | Status: DC
  Administered 2019-01-21: 07:00:00 via INTRAVENOUS

## 2019-01-21 MED ORDER — PROPOFOL 10 MG/ML IV BOLUS
INTRAVENOUS | Status: DC | PRN
  Administered 2019-01-21 (×4): 50 mg via INTRAVENOUS
  Administered 2019-01-21: 100 mg via INTRAVENOUS
  Administered 2019-01-21: 20 mg via INTRAVENOUS
  Administered 2019-01-21: 50 mg via INTRAVENOUS

## 2019-01-21 MED ORDER — STERILE WATER FOR IRRIGATION IR SOLN
Status: DC | PRN
  Administered 2019-01-21: 08:00:00

## 2019-01-21 MED ORDER — GLYCOPYRROLATE 0.2 MG/ML IJ SOLN
INTRAMUSCULAR | Status: DC | PRN
  Administered 2019-01-21: 0.1 mg via INTRAVENOUS

## 2019-01-21 MED ORDER — MIDAZOLAM HCL 2 MG/2ML IJ SOLN
INTRAMUSCULAR | Status: DC | PRN
  Administered 2019-01-21: 2 mg via INTRAVENOUS

## 2019-01-21 SURGICAL SUPPLY — 33 items
BALLN DILATOR 10-12 8 (BALLOONS)
BALLN DILATOR 12-15 8 (BALLOONS)
BALLN DILATOR 15-18 8 (BALLOONS)
BALLN DILATOR CRE 0-12 8 (BALLOONS)
BALLN DILATOR ESOPH 8 10 CRE (MISCELLANEOUS) IMPLANT
BALLOON DILATOR 12-15 8 (BALLOONS) IMPLANT
BALLOON DILATOR 15-18 8 (BALLOONS) IMPLANT
BALLOON DILATOR CRE 0-12 8 (BALLOONS) IMPLANT
BLOCK BITE 60FR ADLT L/F GRN (MISCELLANEOUS) ×2 IMPLANT
CANISTER SUCT 1200ML W/VALVE (MISCELLANEOUS) ×2 IMPLANT
CLIP HMST 235XBRD CATH ROT (MISCELLANEOUS) IMPLANT
CLIP RESOLUTION 360 11X235 (MISCELLANEOUS)
ELECT REM PT RETURN 9FT ADLT (ELECTROSURGICAL)
ELECTRODE REM PT RTRN 9FT ADLT (ELECTROSURGICAL) IMPLANT
FCP ESCP3.2XJMB 240X2.8X (MISCELLANEOUS) ×1
FORCEPS BIOP RAD 4 LRG CAP 4 (CUTTING FORCEPS) IMPLANT
FORCEPS BIOP RJ4 240 W/NDL (MISCELLANEOUS) ×1
FORCEPS ESCP3.2XJMB 240X2.8X (MISCELLANEOUS) ×1 IMPLANT
GOWN CVR UNV OPN BCK APRN NK (MISCELLANEOUS) ×2 IMPLANT
GOWN ISOL THUMB LOOP REG UNIV (MISCELLANEOUS) ×2
INJECTOR VARIJECT VIN23 (MISCELLANEOUS) IMPLANT
KIT DEFENDO VALVE AND CONN (KITS) IMPLANT
KIT ENDO PROCEDURE OLY (KITS) ×2 IMPLANT
MARKER SPOT ENDO TATTOO 5ML (MISCELLANEOUS) IMPLANT
RETRIEVER NET PLAT FOOD (MISCELLANEOUS) IMPLANT
SNARE SHORT THROW 13M SML OVAL (MISCELLANEOUS) IMPLANT
SNARE SHORT THROW 30M LRG OVAL (MISCELLANEOUS) IMPLANT
SPOT EX ENDOSCOPIC TATTOO (MISCELLANEOUS)
SYR INFLATION 60ML (SYRINGE) IMPLANT
TRAP ETRAP POLY (MISCELLANEOUS) IMPLANT
VARIJECT INJECTOR VIN23 (MISCELLANEOUS)
WATER STERILE IRR 250ML POUR (IV SOLUTION) ×2 IMPLANT
WIRE CRE 18-20MM 8CM F G (MISCELLANEOUS) IMPLANT

## 2019-01-21 NOTE — H&P (Signed)
Cephas Darby, MD [address]  [place]  [place], [place] [address]  Main: [phone]  Fax: [phone] Pager: [phone]  Primary Care Physician:  Birdie Sons, MD Primary Gastroenterologist:  Dr. Cephas Darby  Pre-Procedure History & Physical: HPI:  James Moreno is a 53 y.o. male is here for an endoscopy.   Past Medical History:  Diagnosis Date  . Acute kidney failure, unspecified ([place])   . Anxiety   . Diabetes mellitus, type 2 ([place])   . GERD (gastroesophageal reflux disease)   . History of hepatitis B   . Hypertension   . Hypothyroidism   . Mitral valve disorders(424.0)   . Pityriasis 12/27/2014  . Primary pulmonary HTN ([place])    Pt denies ever having.  . Tricuspid valve disorders, specified as nonrheumatic     Past Surgical History:  Procedure Laterality Date  . APPENDECTOMY    . Carotid Doppler Ultrasound  06/14/2009   39% stenosis of bilateral internal carotid artery, bilateral anterograde vertebral flow  . EYE SURGERY     right  . HEMORROIDECTOMY    . KIDNEY TRANSPLANT Right 2016  . MECKEL DIVERTICULUM EXCISION     infancy  . Myocardial Perfusion scan  01/17/2009   [facility], non- ischemic. LVEF= 55%  . PARS PLANA VITRECTOMY Left 02/09/2015   Procedure: Pan retinal photocoagulation 24235;  Surgeon: Milus Height, MD;  Location: [facility] ORS;  Service: Ophthalmology;  Laterality: Left;  . REFRACTIVE SURGERY Left   . sleep study  01/09/2011   Severe sleep apnea. AHI 72.9/hr. RDI=83.0/hr. Desaturation to 69.0% Emergency CPAP titaration to 14.0cm (01/14/19 resolved after wt loss after kidney transplant.)    Prior to Admission medications   Medication Sig Start Date End Date Taking? Authorizing Provider  acetaminophen (TYLENOL) 500 MG tablet Take 500 mg by mouth every 6 (six) hours as needed (headaches.).   Yes [provider]  alprazolam Duanne Moron) 2 MG tablet Take 1 tablet (2 mg total) by mouth 3 (three) times daily as needed  for sleep. 12/13/18  Yes Birdie Sons, MD  amLODipine (NORVASC) 10 MG tablet Take 10 mg by mouth daily.  01/02/19  Yes [provider]  carvedilol (COREG) 6.25 MG tablet TAKE 1 TABLET TWICE A DAY WITH MEALS (NEED APPOINTMENT FOR FURTHER REFILLS) Patient taking differently: Take 12.5 mg by mouth 2 (two) times daily with a meal.  12/15/18  Yes Gollan, Kathlene November, MD  cinacalcet (SENSIPAR) 30 MG tablet Take 30 mg by mouth daily.   Yes [provider]  fenofibrate (TRICOR) 145 MG tablet Take 145 mg by mouth daily.   Yes [provider]  ibuprofen (ADVIL,MOTRIN) 200 MG tablet Take 200-400 mg by mouth every 8 (eight) hours as needed (for headaches.).   Yes [provider]  insulin aspart (NOVOLOG) 100 UNIT/ML injection Inject 10-16 Units into the skin 3 (three) times daily before meals. Sliding scale (01/14/19 - Pt uses 30 units TID with meals, plus SS of 2 units per 50 over 150)   Yes [provider]  Insulin Degludec (TRESIBA FLEXTOUCH [place]) Inject 25 Units into the skin daily with lunch.    Yes [provider]  losartan (COZAAR) 50 MG tablet Take 50 mg by mouth at bedtime.  01/23/16  Yes [provider]  mycophenolate (MYFORTIC) 360 MG TBEC EC tablet Take 720 mg by mouth 2 (two) times daily.    Yes [provider]  naloxone [facility]) nasal spray 4 mg/0.1 mL Narcan  4 mg/actuation nasal spray   Yes [provider]  oxyCODONE (OXY IR/ROXICODONE) 5 MG immediate release tablet Take 1-2 tablets (5-10 mg total) by mouth every 6 (six) hours as needed for severe pain. 01/12/19  Yes Birdie Sons, MD  rosuvastatin (CRESTOR) 40 MG tablet Take 1 tablet (40 mg total) by mouth daily. 09/05/18 01/14/19 Yes Gollan, Kathlene November, MD  sildenafil (VIAGRA) 100 MG tablet TAKE ONE-HALF (1/2) TO ONE TABLET DAILY AS NEEDED FOR ERECTILE DYSFUNCTION 10/11/18  Yes Birdie Sons, MD  tacrolimus (PROGRAF) 1 MG capsule Take 3 mg by mouth 2 (two) times  daily.  01/11/15  Yes [provider]  triamcinolone cream (KENALOG) 0.1 % APPLY DAILY TO INFLAMED BUMPS AS NEEDED 10/06/18  Yes [provider]  B-D ULTRAFINE III SHORT PEN 31G X 8 MM MISC  12/29/18   [provider]  BD INSULIN SYRINGE U/F 31G X 5/16" 1 ML MISC  12/29/18   [provider]  Blood Glucose Monitoring Suppl ([place]) DEVI Frequency:ONCE   Dosage:0.0     Instructions:  Note:Dose: N/A 04/27/10   [provider]  Lancets (ONETOUCH DELICA PLUS [id number]) [place]  12/29/18   [provider]  omeprazole (PRILOSEC) 40 MG capsule Take 1 capsule (40 mg total) by mouth 2 (two) times daily. Patient taking differently: Take 80 mg by mouth 2 (two) times daily.  01/06/19 04/06/19  Lin Landsman, MD  [facility] VERIO test strip  12/29/18   [provider]    Allergies as of 01/06/2019 - Review Complete 01/06/2019  Allergen Reaction Noted  . No known allergies  05/21/2018    Family History  Problem Relation Age of Onset  . Hypertension Mother   . Hyperlipidemia Mother   . Melanoma Father     Social History   Socioeconomic History  . Marital status: Legally Separated    Spouse name: Not on file  . Number of children: 1  . Years of education: Not on file  . Highest education level: Associate degree: occupational, Hotel manager, or vocational program  Occupational History  . Occupation: Insurance account manager    Comment: on disability  Social Needs  . Financial resource strain: Not hard at all  . Food insecurity    Worry: Never true    Inability: Never true  . Transportation needs    Medical: No    Non-medical: No  Tobacco Use  . Smoking status: Current Every Day Smoker    Packs/day: 1.00    Years: 38.00    Pack years: 38.00    Types: Cigarettes  . Smokeless tobacco: Never Used  . Tobacco comment: since age 32.  Substance and Sexual Activity  . Alcohol use: Not on file    Comment: Excessive alcohol  consumption in the past. Quit around 2016  . Drug use: No  . Sexual activity: Not on file  Lifestyle  . Physical activity    Days per week: 0 days    Minutes per session: 0 min  . Stress: Not at all  Relationships  . Social Herbalist on phone: Patient refused    Gets together: Patient refused    Attends religious service: Patient refused    Active member of club or organization: Patient refused    Attends meetings of clubs or organizations: Patient refused    Relationship status: Patient refused  . Intimate partner violence    Fear of current or ex partner: Patient refused  Emotionally abused: Patient refused    Physically abused: Patient refused    Forced sexual activity: Patient refused  Other Topics Concern  . Not on file  Social History Narrative  . Not on file    Review of Systems: See HPI, otherwise negative ROS  Physical Exam: BP (!) 157/76   Pulse 81   Temp 97.6 F (36.4 C) (Temporal)   Ht 5\' 11"  (1.803 m)   Wt 102.1 kg   SpO2 97%   BMI 31.38 kg/m  General:   Alert,  pleasant and cooperative in NAD Head:  Normocephalic and atraumatic. Neck:  Supple; no masses or thyromegaly. Lungs:  Clear throughout to auscultation.    Heart:  Regular rate and rhythm. Abdomen:  Soft, nontender and nondistended. Normal bowel sounds, without guarding, and without rebound.   Neurologic:  Alert and  oriented x4;  grossly normal neurologically.  Impression/Plan: James Moreno is here for an endoscopy to be performed for dysphagia  Risks, benefits, limitations, and alternatives regarding  endoscopy have been reviewed with the patient.  Questions have been answered.  All parties agreeable.   Sherri Sear, MD  01/21/2019, 8:13 AM

## 2019-01-21 NOTE — Anesthesia Preprocedure Evaluation (Addendum)
Anesthesia Evaluation  Patient identified by MRN, date of birth, ID band Patient awake    Reviewed: Allergy & Precautions, NPO status , Patient's Chart, lab work & pertinent test results, reviewed documented beta blocker date and time   History of Anesthesia Complications Negative for: history of anesthetic complications  Airway Mallampati: II  TM Distance: >3 FB Neck ROM: Full    Dental no notable dental hx.    Pulmonary sleep apnea , Current SmokerPatient did not abstain from smoking.,    Pulmonary exam normal breath sounds clear to auscultation       Cardiovascular Exercise Tolerance: Good hypertension, + Peripheral Vascular Disease  Normal cardiovascular exam Rhythm:Regular Rate:Normal     Neuro/Psych PSYCHIATRIC DISORDERS Anxiety    GI/Hepatic GERD  ,  Endo/Other  diabetesHypothyroidism   Renal/GU Renal disease     Musculoskeletal   Abdominal (+) + obese,  Abdomen: soft.    Peds  Hematology   Anesthesia Other Findings   Reproductive/Obstetrics                             Anesthesia Physical Anesthesia Plan  ASA: III  Anesthesia Plan: General   Post-op Pain Management:    Induction: Intravenous  PONV Risk Score and Plan: 1  Airway Management Planned: Natural Airway  Additional Equipment:   Intra-op Plan:   Post-operative Plan:   Informed Consent: I have reviewed the patients History and Physical, chart, labs and discussed the procedure including the risks, benefits and alternatives for the proposed anesthesia with the patient or authorized representative who has indicated his/her understanding and acceptance.       Plan Discussed with: CRNA and Anesthesiologist  Anesthesia Plan Comments:         Anesthesia Quick Evaluation  Patient Active Problem List   Diagnosis Date Noted  . Benign essential hypertension 12/11/2018  . Secondary hyperparathyroidism  of renal origin ([place]) 12/11/2018  . Severe tobacco use disorder 08/01/2018  . Atherosclerosis of artery of extremity with ulceration ([place]) 07/23/2018  . Gastroesophageal reflux disease 05/26/2018  . Hepatitis B antibody positive 05/26/2018  . PAD (peripheral artery disease) ([place]) 10/30/2017  . Chronic ulcer of heel, right, with unspecified severity ([place]) 10/16/2017  . Hepatitis B core antibody positive 07/03/2017  . Hypertriglyceridemia 07/03/2017  . Chronic, continuous use of opioids 03/28/2016  . Anxiety 03/28/2016  . Pain in surgical scar 11/07/2015  . Bulging eyes 01/24/2015  . Leg mass 01/24/2015  . Renal transplant, status post 01/24/2015  . Carotid arterial disease ([place]) 12/27/2014  . Compulsive tobacco user syndrome 12/27/2014  . Atypical chest pain 06/20/2013  . Abnormal EKG 04/06/2013  . Erectile dysfunction 11/29/2012  . Obesity 11/28/2012  . Type 2 diabetes mellitus with diabetic nephropathy ([place]) 11/28/2012  . Hypertension 12/21/2011  . Hyperlipidemia 12/21/2011  . 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 10/30/2011  . Obstructive apnea 01/09/2011  . History of other malignant neoplasm of skin 07/16/2006  . Glaucoma 01/13/2006  . Episodic paroxysmal anxiety disorder 03/26/1998    CBC Latest Ref Rng & Units 10/21/2015 06/21/2013  WBC 3.8 - 10.6 K/uL 8.9 9.7  Hemoglobin 13.0 - 18.0 g/dL 16.3 10.1(L)  Hematocrit 40.0 - 52.0 % 46.7 28.9(L)  Platelets 150 - 440 K/uL 140(L) 175   BMP Latest Ref Rng & Units 10/21/2015 06/21/2013 05/09/2011  Glucose 65 - 99 mg/dL 205(H) 196(H) -  BUN 6 - 20 mg/dL 34(H) 55(H) -  Creatinine 0.61 - 1.24 mg/dL 2.18(H)  9.88(H) -  Sodium 135 - 145 mmol/L 136 132(L) -  Potassium 3.5 - 5.1 mmol/L 5.3(H) 4.1 4.1  Chloride 101 - 111 mmol/L 106 100 -  CO2 22 - 32 mmol/L 24 21 -  Calcium 8.9 - 10.3 mg/dL 10.6(H) 7.3(L) -    Risks and benefits of anesthesia discussed at length, patient or surrogate demonstrates understanding. Appropriately NPO. Plan  to proceed with anesthesia. Patient did not take alprazolam this AM and is feeling "extremely anxious", will plan to give midazolam.  Champ Mungo, MD

## 2019-01-21 NOTE — Anesthesia Procedure Notes (Signed)
Date/Time: 01/21/2019 8:20 AM Performed by: Cameron Ali, CRNA Pre-anesthesia Checklist: Patient identified, Emergency Drugs available, Suction available, Timeout performed and Patient being monitored Patient Re-evaluated:Patient Re-evaluated prior to induction Oxygen Delivery Method: Nasal cannula Placement Confirmation: positive ETCO2

## 2019-01-21 NOTE — Op Note (Addendum)
[facility] Patient Name: James Moreno Procedure Date: 01/21/2019 8:10 AM MRN: [id number] Account #: [id number] Date of Birth: Dec 24, 1965 Admit Type: Outpatient Age: 53 Room: [facility] OR ROOM 01 Gender: Male Note Status: Finalized Procedure:            Upper GI endoscopy Indications:          Esophageal dysphagia, Reflux esophagitis Providers:            Lin Landsman MD, MD Referring MD:         Kirstie Peri. Caryn Section, MD (Referring MD) Medicines:            Monitored Anesthesia Care Complications:        No immediate complications. Estimated blood loss: None. Procedure:            Pre-Anesthesia Assessment:                       - Prior to the procedure, a History and Physical was                        performed, and patient medications and allergies were                        reviewed. The patient is competent. The risks and                        benefits of the procedure and the sedation options and                        risks were discussed with the patient. All questions                        were answered and informed consent was obtained.                        Patient identification and proposed procedure were                        verified by the physician, the nurse, the                        anesthesiologist, the anesthetist and the technician in                        the pre-procedure area in the procedure room in the                        endoscopy suite. Mental Status Examination: alert and                        oriented. Airway Examination: normal oropharyngeal                        airway and neck mobility. Respiratory Examination:                        clear to auscultation. CV Examination: normal.                        Prophylactic Antibiotics: The patient does not require  prophylactic antibiotics. Prior Anticoagulants: The                        patient has taken no previous anticoagulant or                       antiplatelet agents. ASA Grade Assessment: III - A                        patient with severe systemic disease. After reviewing                        the risks and benefits, the patient was deemed in                        satisfactory condition to undergo the procedure. The                        anesthesia plan was to use monitored anesthesia care                        (MAC). Immediately prior to administration of                        medications, the patient was re-assessed for adequacy                        to receive sedatives. The heart rate, respiratory rate,                        oxygen saturations, blood pressure, adequacy of                        pulmonary ventilation, and response to care were                        monitored throughout the procedure. The physical status                        of the patient was re-assessed after the procedure.                       After obtaining informed consent, the endoscope was                        passed under direct vision. Throughout the procedure,                        the patient's blood pressure, pulse, and oxygen                        saturations were monitored continuously. The was                        introduced through the mouth, and advanced to the                        second part of duodenum. The upper GI endoscopy was  accomplished without difficulty. The patient tolerated                        the procedure well. Findings:      The duodenal bulb and second portion of the duodenum were normal.      Localized polypoid mucosa measuring about 2cm in size characterized by       congestion and inflammation were found in the prepyloric region of the       stomach. Biopsies were taken with a cold forceps for histology.      The gastric body, incisura and gastric antrum were normal. Biopsies were       taken with a cold forceps for Helicobacter pylori testing.      The cardia  and gastric fundus were normal on retroflexion.      Esophagogastric landmarks were identified: the gastroesophageal junction       was found at 45 cm from the incisors.      The gastroesophageal junction and examined esophagus were normal.       Biopsies were obtained from the proximal and distal esophagus with cold       forceps for histology of suspected eosinophilic esophagitis. Impression:           - Normal duodenal bulb and second portion of the                        duodenum.                       - Congested and inflamed mucosa in the prepyloric                        region of the stomach. Biopsied.                       - Normal gastric body, incisura and antrum. Biopsied.                       - Esophagogastric landmarks identified.                       - Normal gastroesophageal junction and esophagus.                        Biopsied. Recommendation:       - Discharge patient to home (with escort).                       - Resume previous diet today.                       - Continue present medications.                       - Await pathology results.                       - Use Prilosec (omeprazole) 40 mg PO daily.                       - Return to my office as previously scheduled.                       - Follow an  antireflux regimen. Procedure Code(s):    --- Professional ---                       [phone], Esophagogastroduodenoscopy, flexible, transoral;                        with biopsy, single or multiple Diagnosis Code(s):    --- Professional ---                       K31.89, Other diseases of stomach and duodenum                       K29.70, Gastritis, unspecified, without bleeding                       R13.14, Dysphagia, pharyngoesophageal phase                       K21.0, Gastro-esophageal reflux disease with esophagitis CPT copyright 2019 American Medical Association. All rights reserved. The codes documented in this report are preliminary and upon coder review  may  be revised to meet current compliance requirements. Dr. Ulyess Mort Lin Landsman MD, MD 01/21/2019 8:41:20 AM This report has been signed electronically. Number of Addenda: 0 Note Initiated On: 01/21/2019 8:10 AM Total Procedure Duration: 0 hours 10 minutes 25 seconds  Estimated Blood Loss: Estimated blood loss: none.      [facility]

## 2019-01-21 NOTE — Anesthesia Postprocedure Evaluation (Signed)
Anesthesia Post Note  Patient: James Moreno  Procedure(s) Performed: ESOPHAGOGASTRODUODENOSCOPY (EGD) WITH PROPOFOL (N/A )  Patient location during evaluation: PACU Anesthesia Type: General Level of consciousness: awake and alert Pain management: pain level controlled Vital Signs Assessment: post-procedure vital signs reviewed and stable Respiratory status: spontaneous breathing, nonlabored ventilation, respiratory function stable and patient connected to nasal cannula oxygen Cardiovascular status: blood pressure returned to baseline and stable Postop Assessment: no apparent nausea or vomiting Anesthetic complications: no    Sinda Du

## 2019-01-21 NOTE — Transfer of Care (Signed)
Immediate Anesthesia Transfer of Care Note  Patient: James Moreno  Procedure(s) Performed: ESOPHAGOGASTRODUODENOSCOPY (EGD) WITH PROPOFOL (N/A )  Patient Location: PACU  Anesthesia Type: General  Level of Consciousness: awake, alert  and patient cooperative  Airway and Oxygen Therapy: Patient Spontanous Breathing and Patient connected to supplemental oxygen  Post-op Assessment: Post-op Vital signs reviewed, Patient's Cardiovascular Status Stable, Respiratory Function Stable, Patent Airway and No signs of Nausea or vomiting  Post-op Vital Signs: Reviewed and stable  Complications: No apparent anesthesia complications

## 2019-01-22 ENCOUNTER — Telehealth: Payer: Self-pay | Admitting: Family Medicine

## 2019-01-22 ENCOUNTER — Encounter: Payer: Self-pay | Admitting: Gastroenterology

## 2019-01-22 ENCOUNTER — Ambulatory Visit: Payer: Medicare Other

## 2019-01-22 DIAGNOSIS — I151 Hypertension secondary to other renal disorders: Secondary | ICD-10-CM

## 2019-01-22 DIAGNOSIS — Z125 Encounter for screening for malignant neoplasm of prostate: Secondary | ICD-10-CM

## 2019-01-22 DIAGNOSIS — N2889 Other specified disorders of kidney and ureter: Secondary | ICD-10-CM

## 2019-01-22 NOTE — Telephone Encounter (Signed)
Patient was in earlier this week and we requested copy of most recent labs from Dr. Barkley Boards office. Can you see if they got this request and when they are gong to send [place] results. Thanks!

## 2019-01-22 NOTE — OR Nursing (Signed)
Changed case start to match anesthesia induction time.

## 2019-01-23 NOTE — Addendum Note (Signed)
Addended by: Birdie Sons on: 01/23/2019 05:33 PM   Modules accepted: Orders

## 2019-01-23 NOTE — Telephone Encounter (Signed)
Contacted Dr. Barkley Boards office receptionist states the fax machine has not been working correctly. She will send the results now.

## 2019-01-23 NOTE — Telephone Encounter (Signed)
Just needs PSA, everything else was done by Dr. Bary Leriche. Please have order ready for patient when he comes in for flu vaccine on 01-27-2019

## 2019-01-26 ENCOUNTER — Encounter: Payer: Self-pay | Admitting: Gastroenterology

## 2019-01-26 ENCOUNTER — Telehealth: Payer: Self-pay

## 2019-01-26 NOTE — Telephone Encounter (Signed)
Patient is calling about path results. Informed Dr. Marius Ditch sent result through mychart this morning. Read results that said:  Biopsy results from recent upper endoscopy came back normal. Please continue taking prilosec 40mg  BID and follow up with me in next 64months  Patient made appointment on 04/14/2019. Patient verbalized understanding of lab results

## 2019-01-26 NOTE — Telephone Encounter (Signed)
Lab slip at front desk in [address]. Note added to flu appointment stating patient needs to have labs done.

## 2019-01-27 ENCOUNTER — Ambulatory Visit (INDEPENDENT_AMBULATORY_CARE_PROVIDER_SITE_OTHER): Payer: Medicare Other

## 2019-01-27 ENCOUNTER — Other Ambulatory Visit: Payer: Self-pay

## 2019-01-27 DIAGNOSIS — E113592 Type 2 diabetes mellitus with proliferative diabetic retinopathy without macular edema, left eye: Secondary | ICD-10-CM | POA: Diagnosis not present

## 2019-01-27 DIAGNOSIS — Z23 Encounter for immunization: Secondary | ICD-10-CM

## 2019-01-27 DIAGNOSIS — Z125 Encounter for screening for malignant neoplasm of prostate: Secondary | ICD-10-CM | POA: Diagnosis not present

## 2019-01-27 LAB — HM DIABETES EYE EXAM

## 2019-01-28 ENCOUNTER — Encounter: Payer: Self-pay | Admitting: Family Medicine

## 2019-01-28 ENCOUNTER — Telehealth: Payer: Self-pay

## 2019-01-28 DIAGNOSIS — E113592 Type 2 diabetes mellitus with proliferative diabetic retinopathy without macular edema, left eye: Secondary | ICD-10-CM | POA: Insufficient documentation

## 2019-01-28 LAB — PSA TOTAL (REFLEX TO FREE): Prostate Specific Ag, Serum: 0.2 ng/mL (ref 0.0–4.0)

## 2019-01-28 NOTE — Telephone Encounter (Signed)
Done

## 2019-01-28 NOTE — Telephone Encounter (Signed)
Patient is requesting call back from CMA to discuss recent lab results, patient states that he was looking at them on Mychart but did not understand. Patient can be reached at [phone]. KW

## 2019-02-04 ENCOUNTER — Other Ambulatory Visit: Payer: Self-pay | Admitting: Ophthalmology

## 2019-02-04 DIAGNOSIS — H532 Diplopia: Secondary | ICD-10-CM

## 2019-02-04 DIAGNOSIS — E079 Disorder of thyroid, unspecified: Secondary | ICD-10-CM

## 2019-02-04 DIAGNOSIS — E0500 Thyrotoxicosis with diffuse goiter without thyrotoxic crisis or storm: Secondary | ICD-10-CM

## 2019-02-04 DIAGNOSIS — H5711 Ocular pain, right eye: Secondary | ICD-10-CM | POA: Diagnosis not present

## 2019-02-04 DIAGNOSIS — H05821 Myopathy of extraocular muscles, right orbit: Secondary | ICD-10-CM

## 2019-02-10 ENCOUNTER — Ambulatory Visit: Payer: Medicare Other

## 2019-02-10 ENCOUNTER — Other Ambulatory Visit: Payer: Self-pay | Admitting: Family Medicine

## 2019-02-10 DIAGNOSIS — R52 Pain, unspecified: Secondary | ICD-10-CM

## 2019-02-10 DIAGNOSIS — L905 Scar conditions and fibrosis of skin: Secondary | ICD-10-CM

## 2019-02-10 DIAGNOSIS — M79604 Pain in right leg: Secondary | ICD-10-CM

## 2019-02-10 NOTE — Telephone Encounter (Signed)
Requested medication (s) are due for refill today: yes  Requested medication (s) are on the active medication list: yes  Last refill:  01/12/2019  Future visit scheduled: no  Notes to clinic: refill cannot be delegated  Review for refill   Requested Prescriptions  Pending Prescriptions Disp Refills   oxyCODONE (OXY IR/ROXICODONE) 5 MG immediate release tablet 240 tablet 0    Sig: Take 1-2 tablets (5-10 mg total) by mouth every 6 (six) hours as needed for severe pain.     Not Delegated - Analgesics:  Opioid Agonists Failed - 02/10/2019 12:47 PM      Failed - This refill cannot be delegated      Failed - Urine Drug Screen completed in last 360 days.      Passed - Valid encounter within last 6 months    Recent Outpatient Visits          3 weeks ago Mixed hyperlipidemia   [facility] Birdie Sons, MD   1 year ago Hypertension secondary to other renal disorders   [facility] Birdie Sons, MD   1 year ago [place], Donald E, MD   2 years ago Chronic, continuous use of opioids   [facility] Birdie Sons, MD   2 years ago Pain in surgical scar   [facility] Birdie Sons, MD      Future Appointments            In 2 months Vanga, Tally Due, MD [place]

## 2019-02-10 NOTE — Telephone Encounter (Signed)
Copied from [place] [id number]. Topic: Quick Communication - Rx Refill/Question >> Feb 10, 2019 12:44 PM Mcneil, Ja-Kwan wrote: Medication: oxyCODONE (OXY IR/ROXICODONE) 5 MG immediate release tablet  Has the patient contacted their pharmacy? no  Preferred Pharmacy (with phone number or street name): [facility] [address], [place] - [place] [phone] (Phone) [phone] (Fax)  Agent: Please be advised that RX refills may take up to 3 business days. We ask that you follow-up with your pharmacy.

## 2019-02-11 MED ORDER — OXYCODONE HCL 5 MG PO TABS: 5.0000 mg | ORAL_TABLET | Freq: Four times a day (QID) | ORAL | 0 refills | Status: DC | PRN

## 2019-02-12 ENCOUNTER — Telehealth: Payer: Self-pay

## 2019-02-12 DIAGNOSIS — I10 Essential (primary) hypertension: Secondary | ICD-10-CM | POA: Diagnosis not present

## 2019-02-12 DIAGNOSIS — Z940 Kidney transplant status: Secondary | ICD-10-CM | POA: Diagnosis not present

## 2019-02-12 DIAGNOSIS — N2581 Secondary hyperparathyroidism of renal origin: Secondary | ICD-10-CM | POA: Diagnosis not present

## 2019-02-12 DIAGNOSIS — N186 End stage renal disease: Secondary | ICD-10-CM | POA: Diagnosis not present

## 2019-02-12 NOTE — Telephone Encounter (Signed)
Copied from [place] [phone]. Topic: General - Call Back - No Documentation >> Feb 12, 2019 11:08 AM Erick Blinks wrote: Reason for CRM: Pt is calling requesting a call back from PCP regarding "business, nothing medical" Best contact: [phone]

## 2019-02-17 ENCOUNTER — Other Ambulatory Visit: Payer: Self-pay

## 2019-02-17 ENCOUNTER — Ambulatory Visit
Admission: RE | Admit: 2019-02-17 | Discharge: 2019-02-17 | Disposition: A | Discharge status: Home / Self Care | Payer: Medicare Other | Source: Ambulatory Visit | Attending: Ophthalmology | Admitting: Ophthalmology

## 2019-02-17 DIAGNOSIS — H5711 Ocular pain, right eye: Secondary | ICD-10-CM | POA: Diagnosis not present

## 2019-02-17 DIAGNOSIS — H532 Diplopia: Secondary | ICD-10-CM | POA: Insufficient documentation

## 2019-02-17 DIAGNOSIS — H05821 Myopathy of extraocular muscles, right orbit: Secondary | ICD-10-CM | POA: Diagnosis not present

## 2019-02-25 ENCOUNTER — Encounter: Payer: Self-pay | Admitting: Family Medicine

## 2019-02-25 NOTE — Telephone Encounter (Addendum)
Judson Roch, Do you know anything about this patient's billing issue? I sent him a MyChart message that someone would call him about it.

## 2019-02-25 NOTE — Telephone Encounter (Signed)
I called and spoke with patient to get more information as to what this was in regards to. Patient states he has a billing issue from 4.5 years ago that is unresolved. He states he has been getting threatnening phone calls from bill collectors about a past bill. Patient states he is covered doubled by 2 different insurance, and he shouldn't have a bill. He says our office made an error somewhere. I advised patient that he would need to speak with our office manager James Moreno about a billing issue. Patient says that he has already spoken to James Moreno in the past and this problem didn't get resolved. Patient wants to talk to James Moreno about this problem. He says, "James Moreno is my doctor and I want to speak with James Moreno." Patient doesn't want to do a telephone visit or come in the office for an appointment; he says he just wants a phone call from James Moreno.

## 2019-02-25 NOTE — Telephone Encounter (Signed)
Patient calling back. He states he has not heard anything back from office regarding message below. He is requesting to speak with PCP.

## 2019-03-12 ENCOUNTER — Other Ambulatory Visit: Payer: Self-pay | Admitting: Family Medicine

## 2019-03-12 DIAGNOSIS — L905 Scar conditions and fibrosis of skin: Secondary | ICD-10-CM

## 2019-03-12 DIAGNOSIS — M79604 Pain in right leg: Secondary | ICD-10-CM

## 2019-03-12 DIAGNOSIS — R52 Pain, unspecified: Secondary | ICD-10-CM

## 2019-03-12 MED ORDER — OXYCODONE HCL 5 MG PO TABS: 5.0000 mg | ORAL_TABLET | Freq: Four times a day (QID) | ORAL | 0 refills | Status: DC | PRN

## 2019-03-12 NOTE — Telephone Encounter (Signed)
Patient is checking status of medication refill. Call back [phone]

## 2019-03-12 NOTE — Telephone Encounter (Signed)
oxyCODONE (OXY IR/ROXICODONE) 5 MG immediate release tablet     Patient is requesting refill of this medication.     Pharmacy:  [facility] [address], [place] Phone:  [phone]  Fax:  [phone]

## 2019-04-14 ENCOUNTER — Other Ambulatory Visit: Payer: Self-pay | Admitting: Family Medicine

## 2019-04-14 ENCOUNTER — Ambulatory Visit: Payer: Medicare Other | Admitting: Gastroenterology

## 2019-04-14 DIAGNOSIS — L905 Scar conditions and fibrosis of skin: Secondary | ICD-10-CM

## 2019-04-14 DIAGNOSIS — M79604 Pain in right leg: Secondary | ICD-10-CM

## 2019-04-14 DIAGNOSIS — R52 Pain, unspecified: Secondary | ICD-10-CM

## 2019-04-14 MED ORDER — OXYCODONE HCL 5 MG PO TABS: 5.0000 mg | ORAL_TABLET | Freq: Four times a day (QID) | ORAL | 0 refills | Status: DC | PRN

## 2019-04-14 NOTE — Telephone Encounter (Signed)
Requested medication (s) are due for refill today: yes  Requested medication (s) are on the active medication list: yes  Last refill:  03/12/2019  Future visit scheduled: no  Notes to clinic: not delegated    Requested Prescriptions  Pending Prescriptions Disp Refills   oxyCODONE (OXY IR/ROXICODONE) 5 MG immediate release tablet 240 tablet 0    Sig: Take 1-2 tablets (5-10 mg total) by mouth every 6 (six) hours as needed for severe pain.      Not Delegated - Analgesics:  Opioid Agonists Failed - 04/14/2019  9:26 AM      Failed - This refill cannot be delegated      Failed - Urine Drug Screen completed in last 360 days.      Passed - Valid encounter within last 6 months    Recent Outpatient Visits           2 months ago Mixed hyperlipidemia   [facility] Birdie Sons, MD   1 year ago Hypertension secondary to other renal disorders   [facility] Birdie Sons, MD   1 year ago [place], Donald E, MD   2 years ago Chronic, continuous use of opioids   [facility] Birdie Sons, MD   3 years ago Pain in surgical scar   [facility] Birdie Sons, MD

## 2019-04-14 NOTE — Telephone Encounter (Signed)
Medication Refill - Medication: oxyCODONE (OXY IR/ROXICODONE) 5 MG immediate release tablet    Has the patient contacted their pharmacy? No. (Agent: If no, request that the patient contact the pharmacy for the refill.) (Agent: If yes, when and what did the pharmacy advise?)  Preferred Pharmacy (with phone number or street name)[facility] [place], [place] Pt is on last day of meds and will be out after today   Agent: Please be advised that RX refills may take up to 3 business days. We ask that you follow-up with your pharmacy.

## 2019-05-11 ENCOUNTER — Other Ambulatory Visit: Payer: Self-pay | Admitting: Family Medicine

## 2019-05-11 DIAGNOSIS — L905 Scar conditions and fibrosis of skin: Secondary | ICD-10-CM

## 2019-05-11 DIAGNOSIS — M79604 Pain in right leg: Secondary | ICD-10-CM

## 2019-05-11 DIAGNOSIS — R52 Pain, unspecified: Secondary | ICD-10-CM

## 2019-05-11 MED ORDER — OXYCODONE HCL 5 MG PO TABS: 5.0000 mg | ORAL_TABLET | Freq: Four times a day (QID) | ORAL | 0 refills | Status: DC | PRN

## 2019-05-11 NOTE — Telephone Encounter (Signed)
Requested medication (s) are due for refill today: yes  Requested medication (s) are on the active medication list: yes  Last refill:  04/14/2019  Future visit scheduled: no  Notes to clinic:  this refill cannot be delegated    Requested Prescriptions  Pending Prescriptions Disp Refills   oxyCODONE (OXY IR/ROXICODONE) 5 MG immediate release tablet 240 tablet 0    Sig: Take 1-2 tablets (5-10 mg total) by mouth every 6 (six) hours as needed for severe pain.      Not Delegated - Analgesics:  Opioid Agonists Failed - 05/11/2019  9:09 AM      Failed - This refill cannot be delegated      Failed - Urine Drug Screen completed in last 360 days.      Passed - Valid encounter within last 6 months    Recent Outpatient Visits           3 months ago Mixed hyperlipidemia   [facility] Birdie Sons, MD   1 year ago Hypertension secondary to other renal disorders   [facility] Birdie Sons, MD   1 year ago [place], Donald E, MD   2 years ago Chronic, continuous use of opioids   [facility] Birdie Sons, MD   3 years ago Pain in surgical scar   [facility] Birdie Sons, MD

## 2019-05-11 NOTE — Telephone Encounter (Signed)
Medication Refill - Medication: oxycodone  Has the patient contacted their pharmacy? No. (Agent: If no, request that the patient contact the pharmacy for the refill.) (Agent: If yes, when and what did the pharmacy advise?)  Preferred Pharmacy (with phone number or street name): [place], [place] [address] [place] [address]  Phone: [phone] Fax: [phone]  Not a 24 hour pharmacy; exact hours not known.     Agent: Please be advised that RX refills may take up to 3 business days. We ask that you follow-up with your pharmacy.

## 2019-06-04 ENCOUNTER — Other Ambulatory Visit: Payer: Self-pay | Admitting: Family Medicine

## 2019-06-04 DIAGNOSIS — L905 Scar conditions and fibrosis of skin: Secondary | ICD-10-CM

## 2019-06-04 DIAGNOSIS — M79604 Pain in right leg: Secondary | ICD-10-CM

## 2019-06-04 DIAGNOSIS — R52 Pain, unspecified: Secondary | ICD-10-CM

## 2019-06-04 NOTE — Telephone Encounter (Signed)
Medication: oxyCODONE (OXY IR/ROXICODONE) 5 MG immediate release tablet [10814]   Has the patient contacted their pharmacy? Yes  (Agent: If no, request that the patient contact the pharmacy for the refill.) (Agent: If yes, when and what did the pharmacy advise?)  Preferred Pharmacy (with phone number or street name): [place], [place]  Phone:  [phone] Fax:  [phone]     Agent: Please be advised that RX refills may take up to 3 business days. We ask that you follow-up with your pharmacy.

## 2019-06-04 NOTE — Telephone Encounter (Signed)
Copied from [place] [id number]. Topic: Quick Communication - Rx Refill/Question >> Jun 04, 2019  1:55 PM Leward Quan A wrote: Medication: alprazolam Duanne Moron) 2 MG tablet   Has the patient contacted their pharmacy? Yes.   (Agent: If no, request that the patient contact the pharmacy for the refill.) (Agent: If yes, when and what did the pharmacy advise?)  Preferred Pharmacy (with phone number or street name): [facility] [place], [place]  Phone:  [phone] Fax:  [phone]     Agent: Please be advised that RX refills may take up to 3 business days. We ask that you follow-up with your pharmacy.

## 2019-06-04 NOTE — Telephone Encounter (Signed)
Requested medication (s) are due for refill today -yes  Requested medication (s) are on the active medication list -yes  Future visit scheduled -yes  Last refill: 12/13/18 3 RF  Notes to clinic: Patient is requesting refill of non delegated Rx  Requested Prescriptions  Pending Prescriptions Disp Refills   alprazolam (XANAX) 2 MG tablet 90 tablet 3    Sig: Take 1 tablet (2 mg total) by mouth 3 (three) times daily as needed for sleep.      Not Delegated - Psychiatry:  Anxiolytics/Hypnotics Failed - 06/04/2019  1:58 PM      Failed - This refill cannot be delegated      Failed - Urine Drug Screen completed in last 360 days.      Passed - Valid encounter within last 6 months    Recent Outpatient Visits           4 months ago Mixed hyperlipidemia   [facility] Birdie Sons, MD   1 year ago Hypertension secondary to other renal disorders   [facility] Birdie Sons, MD   1 year ago [place], Donald E, MD   2 years ago Chronic, continuous use of opioids   [facility] Birdie Sons, MD   3 years ago Pain in surgical scar   [facility] Birdie Sons, MD       Future Appointments             In 1 month Fisher, Kirstie Peri, MD [facility]                Requested Prescriptions  Pending Prescriptions Disp Refills   alprazolam (XANAX) 2 MG tablet 90 tablet 3    Sig: Take 1 tablet (2 mg total) by mouth 3 (three) times daily as needed for sleep.      Not Delegated - Psychiatry:  Anxiolytics/Hypnotics Failed - 06/04/2019  1:58 PM      Failed - This refill cannot be delegated      Failed - Urine Drug Screen completed in last 360 days.      Passed - Valid encounter within last 6 months    Recent Outpatient Visits           4 months ago Mixed hyperlipidemia   [facility] Birdie Sons, MD   1 year ago Hypertension secondary to  other renal disorders   [facility] Birdie Sons, MD   1 year ago [place], Donald E, MD   2 years ago Chronic, continuous use of opioids   [facility] Birdie Sons, MD   3 years ago Pain in surgical scar   [facility] Birdie Sons, MD       Future Appointments             In 1 month Fisher, Kirstie Peri, MD [facility], [place]

## 2019-06-04 NOTE — Telephone Encounter (Signed)
[place] faxed refill request for the following medications:  alprazolam (XANAX) 2 MG tablet  Please advise.  Thanks, [employer]

## 2019-06-05 NOTE — Telephone Encounter (Signed)
Last dispense 30 day supply 05/11/2019

## 2019-06-08 MED ORDER — OXYCODONE HCL 5 MG PO TABS: 5.0000 mg | ORAL_TABLET | Freq: Four times a day (QID) | ORAL | 0 refills | Status: DC | PRN

## 2019-06-08 MED ORDER — ALPRAZOLAM 2 MG PO TABS: 2.0000 mg | ORAL_TABLET | Freq: Three times a day (TID) | ORAL | 3 refills | Status: DC | PRN

## 2019-07-06 ENCOUNTER — Other Ambulatory Visit: Payer: Self-pay | Admitting: Family Medicine

## 2019-07-06 DIAGNOSIS — L905 Scar conditions and fibrosis of skin: Secondary | ICD-10-CM

## 2019-07-06 DIAGNOSIS — M79604 Pain in right leg: Secondary | ICD-10-CM

## 2019-07-06 DIAGNOSIS — R52 Pain, unspecified: Secondary | ICD-10-CM

## 2019-07-06 MED ORDER — OXYCODONE HCL 5 MG PO TABS: 5.0000 mg | ORAL_TABLET | Freq: Four times a day (QID) | ORAL | 0 refills | Status: DC | PRN

## 2019-07-06 NOTE — Telephone Encounter (Signed)
Copied from [place] [id number]. Topic: Quick Communication - Rx Refill/Question >> Jul 06, 2019  1:24 PM Mcneil, Ja-Kwan wrote: Medication: oxyCODONE (OXY IR/ROXICODONE) 5 MG immediate release tablet  Has the patient contacted their pharmacy? no  Preferred Pharmacy (with phone number or street name): [place], [place]  Phone: [phone]  Fax: [phone]  Agent: Please be advised that RX refills may take up to 3 business days. We ask that you follow-up with your pharmacy.

## 2019-07-06 NOTE — Telephone Encounter (Signed)
Requested medication (s) are due for refill today: no  Requested medication (s) are on the active medication list: yes  Last refill:  06/08/19  Future visit scheduled: yes in 2 weeks  Notes to clinic:  medication not delegated to NT to refill   Requested Prescriptions  Pending Prescriptions Disp Refills   oxyCODONE (OXY IR/ROXICODONE) 5 MG immediate release tablet 240 tablet 0    Sig: Take 1-2 tablets (5-10 mg total) by mouth every 6 (six) hours as needed for severe pain.      Not Delegated - Analgesics:  Opioid Agonists Failed - 07/06/2019  1:27 PM      Failed - This refill cannot be delegated      Failed - Urine Drug Screen completed in last 360 days.      Passed - Valid encounter within last 6 months    Recent Outpatient Visits           5 months ago Mixed hyperlipidemia   [facility] Birdie Sons, MD   1 year ago Hypertension secondary to other renal disorders   [facility] Birdie Sons, MD   2 years ago [place], Donald E, MD   2 years ago Chronic, continuous use of opioids   [facility] Birdie Sons, MD   3 years ago Pain in surgical scar   [facility] Birdie Sons, MD       Future Appointments             In 2 weeks Fisher, Kirstie Peri, MD [facility] [place], [place]

## 2019-07-14 NOTE — Progress Notes (Deleted)
    Established patient visit    Patient: James Moreno   DOB: 1965-08-11   54 y.o. Male  MRN: [id number] Visit Date: 07/14/2019  Today's healthcare provider: Lelon Huh, MD   No chief complaint on file.  Subjective    HPI ***  {Show patient history (optional):23778::" "}   Medications: Outpatient Medications Prior to Visit  Medication Sig  . acetaminophen (TYLENOL) 500 MG tablet Take 500 mg by mouth every 6 (six) hours as needed (headaches.).  Marland Kitchen alprazolam (XANAX) 2 MG tablet Take 1 tablet (2 mg total) by mouth 3 (three) times daily as needed for sleep.  Marland Kitchen amLODipine (NORVASC) 10 MG tablet Take 10 mg by mouth daily.   . B-D ULTRAFINE III SHORT PEN 31G X 8 MM MISC   . BD INSULIN SYRINGE U/F 31G X 5/16" 1 ML MISC   . Blood Glucose Monitoring Suppl (GLUCOCOM BLOOD GLUCOSE MONITOR) DEVI Frequency:ONCE   Dosage:0.0     Instructions:  Note:Dose: N/A  . carvedilol (COREG) 6.25 MG tablet TAKE 1 TABLET TWICE A DAY WITH MEALS (NEED APPOINTMENT FOR FURTHER REFILLS) (Patient taking differently: Take 12.5 mg by mouth 2 (two) times daily with a meal. )  . cinacalcet (SENSIPAR) 30 MG tablet Take 30 mg by mouth daily.  . fenofibrate (TRICOR) 145 MG tablet Take 145 mg by mouth daily.  Marland Kitchen ibuprofen (ADVIL,MOTRIN) 200 MG tablet Take 200-400 mg by mouth every 8 (eight) hours as needed (for headaches.).  Marland Kitchen insulin aspart (NOVOLOG) 100 UNIT/ML injection Inject 10-16 Units into the skin 3 (three) times daily before meals. Sliding scale (01/14/19 - Pt uses 30 units TID with meals, plus SS of 2 units per 50 over 150)  . Insulin Degludec (TRESIBA FLEXTOUCH [place]) Inject 25 Units into the skin daily with lunch.   . Lancets (ONETOUCH DELICA PLUS [id number]) [place]   . losartan (COZAAR) 50 MG tablet Take 50 mg by mouth at bedtime.   . mycophenolate (MYFORTIC) 360 MG TBEC EC tablet Take 720 mg by mouth 2 (two) times daily.   . naloxone (NARCAN) nasal spray 4 mg/0.1 mL Narcan 4 mg/actuation nasal spray  .  omeprazole (PRILOSEC) 40 MG capsule Take 1 capsule (40 mg total) by mouth 2 (two) times daily. (Patient taking differently: Take 80 mg by mouth 2 (two) times daily. )  . ONETOUCH VERIO test strip   . oxyCODONE (OXY IR/ROXICODONE) 5 MG immediate release tablet Take 1-2 tablets (5-10 mg total) by mouth every 6 (six) hours as needed for severe pain.  . rosuvastatin (CRESTOR) 40 MG tablet Take 1 tablet (40 mg total) by mouth daily.  . sildenafil (VIAGRA) 100 MG tablet TAKE ONE-HALF (1/2) TO ONE TABLET DAILY AS NEEDED FOR ERECTILE DYSFUNCTION  . tacrolimus (PROGRAF) 1 MG capsule Take 3 mg by mouth 2 (two) times daily.   Marland Kitchen triamcinolone cream (KENALOG) 0.1 % APPLY DAILY TO INFLAMED BUMPS AS NEEDED   No facility-administered medications prior to visit.    Review of Systems  {Show previous labs (optional):23779::" "}   Objective    There were no vitals taken for this visit. {Show previous vital signs (optional):23777::" "}  Physical Exam  ***  No results found for any visits on 07/20/19.   Assessment & Plan    ***  No follow-ups on file.      {provider attestation***:1}   Lelon Huh, MD  [facility] [phone] (phone) [phone] (fax)  [place]

## 2019-07-16 ENCOUNTER — Other Ambulatory Visit: Payer: Self-pay | Admitting: Gastroenterology

## 2019-07-20 ENCOUNTER — Ambulatory Visit: Admitting: Family Medicine

## 2019-07-31 ENCOUNTER — Other Ambulatory Visit: Payer: Self-pay | Admitting: Family Medicine

## 2019-07-31 DIAGNOSIS — M79604 Pain in right leg: Secondary | ICD-10-CM

## 2019-07-31 DIAGNOSIS — L905 Scar conditions and fibrosis of skin: Secondary | ICD-10-CM

## 2019-07-31 DIAGNOSIS — R52 Pain, unspecified: Secondary | ICD-10-CM

## 2019-07-31 NOTE — Telephone Encounter (Signed)
patient requesting oxyCODONE (OXY IR/ROXICODONE) 5 MG immediate release tablet, informed patient please allow 48 to 72 hour turn around time.  [place], [place] Phone:  [phone]  Fax:  [phone]

## 2019-08-01 NOTE — Telephone Encounter (Signed)
30 days supply dispensed 2 days early on 4-12-201

## 2019-08-03 MED ORDER — OXYCODONE HCL 5 MG PO TABS: 5.0000 mg | ORAL_TABLET | Freq: Four times a day (QID) | ORAL | 0 refills | Status: DC | PRN

## 2019-08-07 NOTE — Progress Notes (Signed)
I,Roshena L Chambers,acting as a scribe for Lelon Huh, MD.,have documented all relevant documentation on the behalf of Lelon Huh, MD,as directed by  Lelon Huh, MD while in the presence of Lelon Huh, MD.  Established patient visit   Patient: James Moreno   DOB: 13-Apr-1965   54 y.o. Male  MRN: [id number] Visit Date: 08/10/2019  Today's healthcare provider: Lelon Huh, MD   Chief Complaint  Patient presents with  . Hypertension  . Hyperlipidemia  . Diabetes    Subjective    HPI  Hypertension, follow-up  BP Readings from Last 3 Encounters:  08/10/19 (!) 144/78  01/21/19 (!) 143/75  01/19/19 (!) 158/82   Wt Readings from Last 3 Encounters:  01/21/19 225 lb (102.1 kg)  01/19/19 230 lb 3.2 oz (104.4 kg)  01/06/19 234 lb 3.2 oz (106.2 kg)     He was last seen for hypertension 7 months ago.  BP at that visit was 143/75. Management since that visit includes; well controlled. He reports good compliance with treatment. He is not having side effects.  He is not exercising. He is not adherent to low salt diet.   Outside blood pressures are not checked.  He does smoke.  Use of agents associated with hypertension: none.   --------------------------------------------------------------------------------------------------- Lipid/Cholesterol, follow-up  Last Lipid Panel: Lab Results  Component Value Date   CHOL 103 12/12/2018   LDLCALC 45 12/12/2018   HDL 40 12/12/2018   TRIG 95 12/12/2018    He was last seen for this 7 months ago.      Management since that visit includes; He is tolerating rosuvastatin well with no adverse effects.  Check with dr Barkley Boards office to see if lipids were checked recently.  He reports good compliance with treatment. He is not having side effects.   Symptoms: No appetite changes No foot ulcerations  No chest pain No chest pressure/discomfort  No dyspnea No orthopnea  No fatigue No lower extremity edema  No  palpitations No paroxysmal nocturnal dyspnea  No nausea No numbness or tingling of extremity  No polydipsia No polyuria  No speech difficulty No syncope   He is following a Regular diet. Current exercise: none  Last metabolic panel Lab Results  Component Value Date   GLUCOSE 205 (H) 10/21/2015   NA 140 12/12/2018   K 4.4 12/12/2018   BUN 29 (A) 12/12/2018   CREATININE 2.0 (A) 12/12/2018   GFRNONAA 33 (L) 10/21/2015   GFRAA 39 (L) 10/21/2015   CALCIUM 10.6 (H) 10/21/2015   AST 14 12/12/2018   ALT 16 12/12/2018   The ASCVD Risk score (Goff DC Jr., et al., 2013) failed to calculate for the following reasons:   The valid total cholesterol range is 130 to 320 mg/dL  --------------------------------------------------------------------------------------------------- Diabetes Mellitus Type II, Follow-up  Lab Results  Component Value Date   HGBA1C 6.7 12/12/2018   HGBA1C 10.4 (A) 04/21/2010   Wt Readings from Last 3 Encounters:  01/21/19 225 lb (102.1 kg)  01/19/19 230 lb 3.2 oz (104.4 kg)  01/06/19 234 lb 3.2 oz (106.2 kg)   Last seen for diabetes 7 months ago. This problem is managed by Endocrinologist Dr. [name], but patient missed his last appointment. Was noted noted to have critically high blood sugar when recently checked by Dr. Candiss Norse.  He reports good compliance with treatment. He is not having side effects.  Symptoms: No fatigue No foot ulcerations  No appetite changes No nausea  No paresthesia of the feet  No polydipsia  No polyuria No visual disturbances   No vomiting     Home blood sugar records: fasting range: 120-150  Episodes of hypoglycemia? No    Current insulin regiment: Novolog Most Recent Eye Exam: <1 year ago Current exercise: none Current diet habits: well balanced  Pertinent Labs: Lab Results  Component Value Date   CHOL 103 12/12/2018   HDL 40 12/12/2018   LDLCALC 45 12/12/2018   TRIG 95 12/12/2018   CHOLHDL 5.5 (H) 01/10/2018    Lab Results  Component Value Date   NA 140 12/12/2018   K 4.4 12/12/2018   CREATININE 2.0 (A) 12/12/2018   GFRNONAA 33 (L) 10/21/2015   GFRAA 39 (L) 10/21/2015   GLUCOSE 205 (H) 10/21/2015      GERD, Follow up:  Current treatment includes Omeprazole 40mg  capsule daily. This medication was previously prescribed by Cardiologist Dr. Rockey Situ. Patient states the 40mg  capsule is difficult to swallow because of its large size. He would like to switch to two 20mg  capsules daily.  He reports good compliance with treatment. He is not having side effects. Marland Kitchen  -----------------------------------------------------------------------------------------      Medications: Outpatient Medications Prior to Visit  Medication Sig  . acetaminophen (TYLENOL) 500 MG tablet Take 500 mg by mouth every 6 (six) hours as needed (headaches.).  Marland Kitchen alprazolam (XANAX) 2 MG tablet Take 1 tablet (2 mg total) by mouth 3 (three) times daily as needed for sleep.  Marland Kitchen amLODipine (NORVASC) 10 MG tablet Take 10 mg by mouth daily.   . B-D ULTRAFINE III SHORT PEN 31G X 8 MM MISC   . BD INSULIN SYRINGE U/F 31G X 5/16" 1 ML MISC   . Blood Glucose Monitoring Suppl (GLUCOCOM BLOOD GLUCOSE MONITOR) DEVI Frequency:ONCE   Dosage:0.0     Instructions:  Note:Dose: N/A  . carvedilol (COREG) 6.25 MG tablet TAKE 1 TABLET TWICE A DAY WITH MEALS (NEED APPOINTMENT FOR FURTHER REFILLS) (Patient taking differently: Take 12.5 mg by mouth 2 (two) times daily with a meal. )  . cinacalcet (SENSIPAR) 30 MG tablet Take 30 mg by mouth daily.  . fenofibrate (TRICOR) 145 MG tablet Take 145 mg by mouth daily.  Marland Kitchen ibuprofen (ADVIL,MOTRIN) 200 MG tablet Take 200-400 mg by mouth every 8 (eight) hours as needed (for headaches.).  Marland Kitchen insulin aspart (NOVOLOG) 100 UNIT/ML injection Inject 10-16 Units into the skin 3 (three) times daily before meals. Sliding scale (01/14/19 - Pt uses 30 units TID with meals, plus SS of 2 units per 50 over 150)  . Insulin Degludec  (TRESIBA FLEXTOUCH [place]) Inject 25 Units into the skin daily with lunch.   . Lancets (ONETOUCH DELICA PLUS [id number]) [place]   . losartan (COZAAR) 50 MG tablet Take 50 mg by mouth at bedtime.   . mycophenolate (MYFORTIC) 360 MG TBEC EC tablet Take 720 mg by mouth 2 (two) times daily.   . naloxone (NARCAN) nasal spray 4 mg/0.1 mL Narcan 4 mg/actuation nasal spray  . omeprazole (PRILOSEC) 40 MG capsule TAKE 1 CAPSULE TWICE A DAY  . ONETOUCH VERIO test strip   . oxyCODONE (OXY IR/ROXICODONE) 5 MG immediate release tablet Take 1-2 tablets (5-10 mg total) by mouth every 6 (six) hours as needed for severe pain.  . sildenafil (VIAGRA) 100 MG tablet TAKE ONE-HALF (1/2) TO ONE TABLET DAILY AS NEEDED FOR ERECTILE DYSFUNCTION  . tacrolimus (PROGRAF) 1 MG capsule Take 3 mg by mouth 2 (two) times daily.   Marland Kitchen triamcinolone cream (KENALOG) 0.1 % APPLY  DAILY TO INFLAMED BUMPS AS NEEDED  . rosuvastatin (CRESTOR) 40 MG tablet Take 1 tablet (40 mg total) by mouth daily.   No facility-administered medications prior to visit.    Review of Systems  Constitutional: Negative for appetite change, chills and fever.  Respiratory: Negative for chest tightness, shortness of breath and wheezing.   Cardiovascular: Negative for chest pain and palpitations.  Gastrointestinal: Negative for abdominal pain, nausea and vomiting.      Objective    BP (!) 144/78 (BP Location: Right Arm, Patient Position: Sitting, Cuff Size: Large)   Pulse 81   Temp (!) 97 F (36.1 C)   Resp 16    Physical Exam   General: Appearance:    Obese male in no acute distress  Eyes:    PERRL, conjunctiva/corneas clear, EOM's intact       Lungs:     Clear to auscultation bilaterally, respirations unlabored  Heart:    Normal heart rate. Normal rhythm. No murmurs, rubs, or gallops.   MS:   All extremities are intact.   Neurologic:   Awake, alert, oriented x 3. No apparent focal neurological           defect.       No results found for any visits  on 08/10/19.  Assessment & Plan     1. Type 2 diabetes mellitus with diabetic nephropathy, without long-term current use of insulin (HCC) Patient is overdue for follow up with Dr. Carmela Rima which critically high a1c when labs recently checked at his nephrologist.  Check renal and a1c today.   2. Mixed hyperlipidemia He is tolerating rosuvastatin well with no adverse effects.    3. Hypertension secondary to other renal disorders Stable. Continue current medications.    4. Pain in surgical scar Doing well current pain management. Reports he does have prescription for naloxone.   5. Atherosclerosis of native artery of right lower extremity with ulceration of heel (HCC) Asymptomatic. Compliant with medication.  Continue aggressive risk factor modification.    6 .GERD Cannot swallow 40mg  omeprazole. Will change to - omeprazole (PRILOSEC) 20 MG capsule; Take 2 capsules (40 mg total) by mouth 2 (two) times daily before a meal.  Dispense: 360 capsule; Refill: 4   No follow-ups on file.         Lelon Huh, MD  [facility] [phone] (phone) [phone] (fax)  [place]

## 2019-08-10 ENCOUNTER — Ambulatory Visit (INDEPENDENT_AMBULATORY_CARE_PROVIDER_SITE_OTHER): Payer: Medicare PPO | Admitting: Family Medicine

## 2019-08-10 ENCOUNTER — Encounter: Payer: Self-pay | Admitting: Family Medicine

## 2019-08-10 ENCOUNTER — Other Ambulatory Visit: Payer: Self-pay

## 2019-08-10 VITALS — BP 144/78 | HR 81 | Temp 97.0°F | Resp 16

## 2019-08-10 DIAGNOSIS — I70234 Atherosclerosis of native arteries of right leg with ulceration of heel and midfoot: Secondary | ICD-10-CM

## 2019-08-10 DIAGNOSIS — E782 Mixed hyperlipidemia: Secondary | ICD-10-CM | POA: Diagnosis not present

## 2019-08-10 DIAGNOSIS — E1121 Type 2 diabetes mellitus with diabetic nephropathy: Secondary | ICD-10-CM | POA: Diagnosis not present

## 2019-08-10 DIAGNOSIS — N2889 Other specified disorders of kidney and ureter: Secondary | ICD-10-CM

## 2019-08-10 DIAGNOSIS — K219 Gastro-esophageal reflux disease without esophagitis: Secondary | ICD-10-CM

## 2019-08-10 DIAGNOSIS — R52 Pain, unspecified: Secondary | ICD-10-CM | POA: Diagnosis not present

## 2019-08-10 DIAGNOSIS — L905 Scar conditions and fibrosis of skin: Secondary | ICD-10-CM

## 2019-08-10 DIAGNOSIS — I151 Hypertension secondary to other renal disorders: Secondary | ICD-10-CM

## 2019-08-10 MED ORDER — OMEPRAZOLE 20 MG PO CPDR: 40.0000 mg | DELAYED_RELEASE_CAPSULE | Freq: Two times a day (BID) | ORAL | 4 refills | Status: DC

## 2019-08-31 ENCOUNTER — Other Ambulatory Visit: Payer: Self-pay | Admitting: Family Medicine

## 2019-08-31 DIAGNOSIS — R52 Pain, unspecified: Secondary | ICD-10-CM

## 2019-08-31 DIAGNOSIS — L905 Scar conditions and fibrosis of skin: Secondary | ICD-10-CM

## 2019-08-31 DIAGNOSIS — M79604 Pain in right leg: Secondary | ICD-10-CM

## 2019-08-31 NOTE — Telephone Encounter (Signed)
rf due 09-02-2019.

## 2019-08-31 NOTE — Telephone Encounter (Signed)
Medication Refill - Medication: oxyCODONE (OXY IR/ROXICODONE) 5 MG immediate release tablet    Preferred Pharmacy (with phone number or street name):  [place], [place] Phone:  [phone]  Fax:  [phone]       Agent: Please be advised that RX refills may take up to 3 business days. We ask that you follow-up with your pharmacy.

## 2019-08-31 NOTE — Telephone Encounter (Signed)
Requested medication (s) are due for refill today: yes  Requested medication (s) are on the active medication list: yes  Last refill:  08/03/2019  Future visit scheduled: yes  Notes to clinic:  this refill cannot be delegated    Requested Prescriptions  Pending Prescriptions Disp Refills   oxyCODONE (OXY IR/ROXICODONE) 5 MG immediate release tablet 240 tablet 0    Sig: Take 1-2 tablets (5-10 mg total) by mouth every 6 (six) hours as needed for severe pain.      Not Delegated - Analgesics:  Opioid Agonists Failed - 08/31/2019  2:44 PM      Failed - This refill cannot be delegated      Failed - Urine Drug Screen completed in last 360 days.      Passed - Valid encounter within last 6 months    Recent Outpatient Visits           3 weeks ago Type 2 diabetes mellitus with diabetic nephropathy, without long-term current use of insulin [facility])   [facility] Birdie Sons, MD   7 months ago Mixed hyperlipidemia   [facility] Birdie Sons, MD   1 year ago Hypertension secondary to other renal disorders   [facility] Birdie Sons, MD   2 years ago [place], Donald E, MD   2 years ago Chronic, continuous use of opioids   [facility] Birdie Sons, MD       Future Appointments             In 5 months Fisher, Kirstie Peri, MD [facility], [facility]

## 2019-09-02 MED ORDER — OXYCODONE HCL 5 MG PO TABS: 5.0000 mg | ORAL_TABLET | Freq: Four times a day (QID) | ORAL | 0 refills | Status: DC | PRN

## 2019-09-18 ENCOUNTER — Telehealth: Payer: Self-pay

## 2019-09-18 LAB — COMPREHENSIVE METABOLIC PANEL
ALT: 9 IU/L (ref 0–44)
AST: 9 IU/L (ref 0–40)
Albumin/Globulin Ratio: 2 (ref 1.2–2.2)
Albumin: 4.3 g/dL (ref 3.8–4.9)
Alkaline Phosphatase: 131 IU/L — ABNORMAL HIGH (ref 48–121)
BUN/Creatinine Ratio: 13 (ref 9–20)
BUN: 25 mg/dL — ABNORMAL HIGH (ref 6–24)
Bilirubin Total: 0.6 mg/dL (ref 0.0–1.2)
CO2: 23 mmol/L (ref 20–29)
Calcium: 9.7 mg/dL (ref 8.7–10.2)
Chloride: 95 mmol/L — ABNORMAL LOW (ref 96–106)
Creatinine, Ser: 1.92 mg/dL — ABNORMAL HIGH (ref 0.76–1.27)
GFR calc Af Amer: 45 mL/min/{1.73_m2} — ABNORMAL LOW (ref >59)
GFR calc non Af Amer: 39 mL/min/{1.73_m2} — ABNORMAL LOW (ref >59)
Globulin, Total: 2.2 g/dL (ref 1.5–4.5)
Glucose: 432 mg/dL — ABNORMAL HIGH (ref 65–99)
Potassium: 4.5 mmol/L (ref 3.5–5.2)
Sodium: 132 mmol/L — ABNORMAL LOW (ref 134–144)
Total Protein: 6.5 g/dL (ref 6.0–8.5)

## 2019-09-18 LAB — HEMOGLOBIN A1C
Est. average glucose Bld gHb Est-mCnc: 232 mg/dL
Hgb A1c MFr Bld: 9.7 % — ABNORMAL HIGH (ref 4.8–5.6)

## 2019-09-18 NOTE — Telephone Encounter (Signed)
-----   Message from Birdie Sons, MD sent at 09/18/2019  7:49 AM EDT ----- A1c is up to 9.7, blood sugar is 432. Please verify If he has been taking insulin and what dose he has been taking.

## 2019-09-18 NOTE — Telephone Encounter (Signed)
He should increase Tresiba to 30 units daily and schedule a follow up with his endocrinologist ASAP

## 2019-09-18 NOTE — Telephone Encounter (Signed)
Patient advised. He states he has been taking  Novolog sliding scale before meals and Tresiba 25 IU daily at lunch. He states he has not been eating healthy the last few months due to going through a divorce. He states he will get back on track since divorce will be finalized on Tuesday.

## 2019-09-18 NOTE — Telephone Encounter (Signed)
Patient advised. He verbalized understanding.

## 2019-09-28 ENCOUNTER — Other Ambulatory Visit: Payer: Self-pay | Admitting: Family Medicine

## 2019-09-29 ENCOUNTER — Other Ambulatory Visit: Payer: Self-pay | Admitting: Family Medicine

## 2019-09-29 DIAGNOSIS — R52 Pain, unspecified: Secondary | ICD-10-CM

## 2019-09-29 DIAGNOSIS — L905 Scar conditions and fibrosis of skin: Secondary | ICD-10-CM

## 2019-09-29 DIAGNOSIS — M79604 Pain in right leg: Secondary | ICD-10-CM

## 2019-09-29 MED ORDER — OMEPRAZOLE 20 MG PO CPDR: 40.0000 mg | DELAYED_RELEASE_CAPSULE | Freq: Two times a day (BID) | ORAL | 4 refills | Status: DC

## 2019-09-29 NOTE — Telephone Encounter (Signed)
Pt Is requesting a refill for  oxyCODONE (OXY IR/ROXICODONE) 5 MG immediate release tablet  omeprazole (PRILOSEC) 20 MG capsule     Pharmacy: [facility] [address], [place]  [place], [place] [address]

## 2019-09-29 NOTE — Telephone Encounter (Signed)
Requested medication (s) are due for refill today: yes  Requested medication (s) are on the active medication list: yes  Last refill:  08/31/2019  Future visit scheduled: yes  Notes to clinic:  this refill cannot be delegated    Requested Prescriptions  Pending Prescriptions Disp Refills   oxyCODONE (OXY IR/ROXICODONE) 5 MG immediate release tablet 240 tablet 0    Sig: Take 1-2 tablets (5-10 mg total) by mouth every 6 (six) hours as needed for severe pain.      Not Delegated - Analgesics:  Opioid Agonists Failed - 09/29/2019 12:37 PM      Failed - This refill cannot be delegated      Failed - Urine Drug Screen completed in last 360 days.      Passed - Valid encounter within last 6 months    Recent Outpatient Visits           1 month ago Type 2 diabetes mellitus with diabetic nephropathy, without long-term current use of insulin ([place])   [facility] Birdie Sons, MD   8 months ago Mixed hyperlipidemia   [facility] Birdie Sons, MD   1 year ago Hypertension secondary to other renal disorders   [facility] Birdie Sons, MD   2 years ago [place], Donald E, MD   2 years ago Chronic, continuous use of opioids   [facility] Birdie Sons, MD       Future Appointments             In 4 months Fisher, Kirstie Peri, MD [facility], [facility]             Signed Prescriptions Disp Refills   omeprazole (PRILOSEC) 20 MG capsule 360 capsule 4    Sig: Take 2 capsules (40 mg total) by mouth 2 (two) times daily before a meal.      Gastroenterology: Proton Pump Inhibitors Passed - 09/29/2019 12:37 PM      Passed - Valid encounter within last 12 months    Recent Outpatient Visits           1 month ago Type 2 diabetes mellitus with diabetic nephropathy, without long-term current use of insulin [facility])   [facility] Birdie Sons, MD   8 months ago Mixed  hyperlipidemia   [facility] Birdie Sons, MD   1 year ago Hypertension secondary to other renal disorders   [facility] Birdie Sons, MD   2 years ago [place], Donald E, MD   2 years ago Chronic, continuous use of opioids   [facility] Birdie Sons, MD       Future Appointments             In 4 months Fisher, Kirstie Peri, MD [facility], [facility]

## 2019-10-01 MED ORDER — OXYCODONE HCL 5 MG PO TABS: 5.0000 mg | ORAL_TABLET | Freq: Four times a day (QID) | ORAL | 0 refills | Status: DC | PRN

## 2019-10-02 ENCOUNTER — Telehealth: Payer: Self-pay | Admitting: Family Medicine

## 2019-10-02 NOTE — Telephone Encounter (Signed)
Patient is calling to request a Prior Authorization for oxyCODONE (OXY IR/ROXICODONE) 5 MG immediate release tablet [[id number]] .  Patient states that he will run out of medication this evening.  PA process was explained to the patient that it can take an extended period of time to complete. CB- [phone]

## 2019-10-05 NOTE — Telephone Encounter (Signed)
Prior Auth completed by [place] and approved. Patient notified.   PA Case: [id number], Status: Approved, Coverage Starts on: 03/27/2019 12:00:00 AM, Coverage Ends on: 03/25/2020 12:00:00 AM

## 2019-10-08 ENCOUNTER — Other Ambulatory Visit: Payer: Self-pay | Admitting: Family Medicine

## 2019-10-08 NOTE — Telephone Encounter (Signed)
[place] faxed refill request for the following medications:  omeprazole (PRILOSEC) 20 MG capsule  Please advise.

## 2019-10-08 NOTE — Telephone Encounter (Signed)
Refills for this prescription was sent into [place] [facility] ([address]) on 09/29/2019 for a 90 day supply with 4 refills. I tried calling patient to verify that he wanted to switch prescription to [facility] mail order pharmacy. I also need to know if patient plans to use [facility] mail order from now on, because that pharmacy is not listed in his preferred pharmacy section.  Unable to leave a message due to patient voice mailbox being full. Will try calling back later.

## 2019-10-13 NOTE — Telephone Encounter (Signed)
Tried calling patient. Unable to leave message due to voicemail box being full.

## 2019-10-15 ENCOUNTER — Other Ambulatory Visit: Payer: Self-pay | Admitting: Family Medicine

## 2019-10-15 NOTE — Telephone Encounter (Signed)
[facility] faxed refill request for the following medications:  omeprazole (PRILOSEC) 20 MG capsule   Please advise.  Thanks, [employer]

## 2019-10-19 NOTE — Telephone Encounter (Signed)
Tried calling patient. His mail box is full so I was unable to leave a message.

## 2019-10-19 NOTE — Telephone Encounter (Signed)
See refill message 10/08/2019.  Thanks,   -Mickel Baas

## 2019-10-28 ENCOUNTER — Other Ambulatory Visit: Payer: Self-pay | Admitting: Family Medicine

## 2019-10-28 DIAGNOSIS — R52 Pain, unspecified: Secondary | ICD-10-CM

## 2019-10-28 DIAGNOSIS — Z03818 Encounter for observation for suspected exposure to other biological agents ruled out: Secondary | ICD-10-CM | POA: Diagnosis not present

## 2019-10-28 DIAGNOSIS — L905 Scar conditions and fibrosis of skin: Secondary | ICD-10-CM

## 2019-10-28 DIAGNOSIS — M79604 Pain in right leg: Secondary | ICD-10-CM

## 2019-10-28 DIAGNOSIS — Z20822 Contact with and (suspected) exposure to covid-19: Secondary | ICD-10-CM | POA: Diagnosis not present

## 2019-10-28 MED ORDER — ALPRAZOLAM 2 MG PO TABS: 1.0000 mg | ORAL_TABLET | Freq: Three times a day (TID) | ORAL | 0 refills | Status: DC | PRN

## 2019-10-28 MED ORDER — OXYCODONE HCL 5 MG PO TABS: 5.0000 mg | ORAL_TABLET | Freq: Four times a day (QID) | ORAL | 0 refills | Status: DC | PRN

## 2019-10-28 NOTE — Telephone Encounter (Signed)
Medication Refill - Medication: alprazolam (XANAX) 2 MG tablet oxyCODONE (OXY IR/ROXICODONE) 5 MG immediate release tablet     Preferred Pharmacy (with phone number or street name):  [place], [place] Phone:  [phone]  Fax:  [phone]       Agent: Please be advised that RX refills may take up to 3 business days. We ask that you follow-up with your pharmacy.

## 2019-10-28 NOTE — Telephone Encounter (Signed)
Requested medication (s) are due for refill today - unknown  Requested medication (s) are on the active medication list -yes  Future visit scheduled -yes  Last refill: 10/01/19  Notes to clinic: Request for non delegated Rx  Requested Prescriptions  Pending Prescriptions Disp Refills   alprazolam (XANAX) 2 MG tablet 90 tablet 0      Not Delegated - Psychiatry:  Anxiolytics/Hypnotics Failed - 10/28/2019  1:29 PM      Failed - This refill cannot be delegated      Failed - Urine Drug Screen completed in last 360 days.      Passed - Valid encounter within last 6 months    Recent Outpatient Visits           2 months ago Type 2 diabetes mellitus with diabetic nephropathy, without long-term current use of insulin ([place])   [facility] Birdie Sons, MD   9 months ago Mixed hyperlipidemia   [facility] Birdie Sons, MD   1 year ago Hypertension secondary to other renal disorders   [facility] Birdie Sons, MD   2 years ago [place], Donald E, MD   2 years ago Chronic, continuous use of opioids   [facility] Birdie Sons, MD       Future Appointments             In 3 months Fisher, Kirstie Peri, MD [facility], [facility]              oxyCODONE (OXY IR/ROXICODONE) 5 MG immediate release tablet 240 tablet 0    Sig: Take 1-2 tablets (5-10 mg total) by mouth every 6 (six) hours as needed for severe pain.      Not Delegated - Analgesics:  Opioid Agonists Failed - 10/28/2019  1:29 PM      Failed - This refill cannot be delegated      Failed - Urine Drug Screen completed in last 360 days.      Passed - Valid encounter within last 6 months    Recent Outpatient Visits           2 months ago Type 2 diabetes mellitus with diabetic nephropathy, without long-term current use of insulin ([place])   [facility] Birdie Sons, MD   9 months ago Mixed  hyperlipidemia   [facility] Birdie Sons, MD   1 year ago Hypertension secondary to other renal disorders   [facility] Birdie Sons, MD   2 years ago [place], Donald E, MD   2 years ago Chronic, continuous use of opioids   [facility] Birdie Sons, MD       Future Appointments             In 3 months Fisher, Kirstie Peri, MD [facility], [facility]                Requested Prescriptions  Pending Prescriptions Disp Refills   alprazolam Duanne Moron) 2 MG tablet 90 tablet 0      Not Delegated - Psychiatry:  Anxiolytics/Hypnotics Failed - 10/28/2019  1:29 PM      Failed - This refill cannot be delegated      Failed - Urine Drug Screen completed in last 360 days.      Passed - Valid encounter within last 6 months    Recent Outpatient Visits  2 months ago Type 2 diabetes mellitus with diabetic nephropathy, without long-term current use of insulin ([place])   [facility] Birdie Sons, MD   9 months ago Mixed hyperlipidemia   [facility] Birdie Sons, MD   1 year ago Hypertension secondary to other renal disorders   [facility] Birdie Sons, MD   2 years ago [place], Donald E, MD   2 years ago Chronic, continuous use of opioids   [facility] Birdie Sons, MD       Future Appointments             In 3 months Fisher, Kirstie Peri, MD [facility], [facility]              oxyCODONE (OXY IR/ROXICODONE) 5 MG immediate release tablet 240 tablet 0    Sig: Take 1-2 tablets (5-10 mg total) by mouth every 6 (six) hours as needed for severe pain.      Not Delegated - Analgesics:  Opioid Agonists Failed - 10/28/2019  1:29 PM      Failed - This refill cannot be delegated      Failed - Urine Drug Screen completed in last 360 days.      Passed - Valid encounter  within last 6 months    Recent Outpatient Visits           2 months ago Type 2 diabetes mellitus with diabetic nephropathy, without long-term current use of insulin [facility])   [facility] Birdie Sons, MD   9 months ago Mixed hyperlipidemia   [facility] Birdie Sons, MD   1 year ago Hypertension secondary to other renal disorders   [facility] Birdie Sons, MD   2 years ago [place], Donald E, MD   2 years ago Chronic, continuous use of opioids   [facility] Birdie Sons, MD       Future Appointments             In 3 months Fisher, Kirstie Peri, MD [facility], [facility]

## 2019-10-29 ENCOUNTER — Other Ambulatory Visit: Payer: Self-pay | Admitting: Family Medicine

## 2019-10-29 ENCOUNTER — Telehealth: Payer: Self-pay | Admitting: Family Medicine

## 2019-10-29 DIAGNOSIS — R52 Pain, unspecified: Secondary | ICD-10-CM

## 2019-10-29 DIAGNOSIS — L905 Scar conditions and fibrosis of skin: Secondary | ICD-10-CM

## 2019-10-29 DIAGNOSIS — M79604 Pain in right leg: Secondary | ICD-10-CM

## 2019-10-29 NOTE — Telephone Encounter (Signed)
Pt called he has tried to pick up medication and it has been cut in half due to insurance reasons . He is asking for Caryn Section to give him a call back regarding medication listed below    alprazolam (XANAX) 2 MG tablet [[id number]]

## 2019-10-30 NOTE — Telephone Encounter (Signed)
Tried calling patient.Voice mailbox full. Need more detailed information on what the problem is with is prescription, and what he would like Dr. Caryn Section to do. Did the directions change? Did the pharmacy change the quantity from what he normally gets? OK for [facility] to speak with patient to collect more information.

## 2019-11-02 NOTE — Telephone Encounter (Signed)
Copied from [place] [id number]. 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 He missed a call this morning

## 2019-11-02 NOTE — Telephone Encounter (Signed)
Tried calling patient. Unable to leave voice mail due to patient's voice mailbox full and unable to accept new messages.

## 2019-11-02 NOTE — Telephone Encounter (Signed)
FYI::::   Spoke with patient he states that he switched insurance companies this month. He is now using [employer]. [employer] will only pay for 45 tablets of Alprazolam per month. Patient requested to pay OOP($23)  for the other 45 tablets. He request that his RX stay the same and he continues getting #90 per 30 days. Last RF was on 10/28/2019.

## 2019-11-23 ENCOUNTER — Other Ambulatory Visit: Payer: Self-pay | Admitting: *Deleted

## 2019-11-23 DIAGNOSIS — R52 Pain, unspecified: Secondary | ICD-10-CM

## 2019-11-23 DIAGNOSIS — L905 Scar conditions and fibrosis of skin: Secondary | ICD-10-CM

## 2019-11-23 DIAGNOSIS — M79604 Pain in right leg: Secondary | ICD-10-CM

## 2019-11-23 NOTE — Telephone Encounter (Signed)
I called and spoke with patient. Patient states his prescription for Xanax 2mg  has been mixed up. The last prescription that was sent into the pharmacy was for 1/2 tablet three times daily with a qty of 90 tablets. He says he normally takes 1 full pill three times daily and gets 90 pills. He would like the next refill to have the directions of 1 tablet three times daily with a qty of 90 which is what he has been on. Patient also request a refill on Oxycodone.

## 2019-11-23 NOTE — Telephone Encounter (Signed)
Requested medication (s) are due for refill today: Yes  Requested medication (s) are on the active medication list: Yes  Last refill:  10/28/19  Future visit scheduled: Yes  Notes to clinic: Unable to refill, cannot delegate     Requested Prescriptions  Pending Prescriptions Disp Refills   oxyCODONE (OXY IR/ROXICODONE) 5 MG immediate release tablet 240 tablet 0    Sig: Take 1-2 tablets (5-10 mg total) by mouth every 6 (six) hours as needed for severe pain.      Not Delegated - Analgesics:  Opioid Agonists Failed - 11/23/2019  3:34 PM      Failed - This refill cannot be delegated      Failed - Urine Drug Screen completed in last 360 days.      Passed - Valid encounter within last 6 months    Recent Outpatient Visits           3 months ago Type 2 diabetes mellitus with diabetic nephropathy, without long-term current use of insulin [facility])   [facility] Birdie Sons, MD   10 months ago Mixed hyperlipidemia   [facility] Birdie Sons, MD   1 year ago Hypertension secondary to other renal disorders   [facility] Birdie Sons, MD   2 years ago [place], Donald E, MD   2 years ago Chronic, continuous use of opioids   [facility] Birdie Sons, MD       Future Appointments             In 2 months Fisher, Kirstie Peri, MD [facility] [place]

## 2019-11-23 NOTE — Telephone Encounter (Signed)
Copied from [place] [phone]. Topic: General - Other >> Nov 23, 2019  3:28 PM Yvette Rack wrote: Reason for CRM: Pt stated he needs to speak with Dr. Maralyn Sago nurse to discuss the Rx for alprazolam Duanne Moron) 2 MG tablet. Pt stated there was a mix up with the Rx and he needs it corrected. Pt requests call back

## 2019-11-23 NOTE — Telephone Encounter (Signed)
30 days dispensed 10-30-2019

## 2019-11-23 NOTE — Telephone Encounter (Signed)
Copied from [place] [phone]. Topic: Quick Communication - Rx Refill/Question >> Nov 23, 2019  3:24 PM Mcneil, Ja-Kwan wrote: Medication: oxyCODONE (OXY IR/ROXICODONE) 5 MG immediate release tablet   Has the patient contacted their pharmacy? no  Preferred Pharmacy (with phone number or street name): [place], [place] Phone: [phone]  Fax: [phone]  Agent: Please be advised that RX refills may take up to 3 business days. We ask that you follow-up with your pharmacy.

## 2019-11-24 MED ORDER — ALPRAZOLAM 2 MG PO TABS: 2.0000 mg | ORAL_TABLET | Freq: Three times a day (TID) | ORAL | 2 refills | Status: DC | PRN

## 2019-11-30 MED ORDER — OXYCODONE HCL 5 MG PO TABS: 5.0000 mg | ORAL_TABLET | Freq: Four times a day (QID) | ORAL | 0 refills | Status: DC | PRN

## 2019-12-10 ENCOUNTER — Other Ambulatory Visit: Payer: Self-pay | Admitting: Family Medicine

## 2019-12-10 DIAGNOSIS — E782 Mixed hyperlipidemia: Secondary | ICD-10-CM

## 2019-12-10 NOTE — Telephone Encounter (Signed)
Medication Refill - Medication: rosuvastatin (CRESTOR) 40 MG tablet fenofibrate (TRICOR) 145 MG tablet  Pt is completely out of both medications and would like them submitted to [place] and [employer]. [facility] for immediacy and [employer] so they can have it on file he says    Has the patient contacted their pharmacy? Yes.   (Agent: If no, request that the patient contact the pharmacy for the refill.) (Agent: If yes, when and what did the pharmacy advise?)  Preferred Pharmacy (with phone number or street name):  [facility] [address] [place] - [place]  [place] [address]  Phone: [phone] Fax: [phone]     Agent: Please be advised that RX refills may take up to 3 business days. We ask that you follow-up with your pharmacy.    Medication Refill - Medication: rosuvastatin (CRESTOR) 40 MG tablet  fenofibrate (TRICOR) 145 MG tablet   Has the patient contacted their pharmacy? Yes.   (Agent: If no, request that the patient contact the pharmacy for the refill.) (Agent: If yes, when and what did the pharmacy advise?)  Preferred Pharmacy (with phone number or street name):  [place], [place]  [place] [address]  Phone: [phone] Fax: [phone]     Agent: Please be advised that RX refills may take up to 3 business days. We ask that you follow-up with your pharmacy.

## 2019-12-10 NOTE — Telephone Encounter (Signed)
Requested medication (s) are due for refill today:no  Requested medication (s) are on the active medication list: yes   Future visit scheduled: yes   Notes to clinic: medication filled by historical provider Review for refill [facility] and [facility]    Requested Prescriptions  Pending Prescriptions Disp Refills   rosuvastatin (CRESTOR) 40 MG tablet 90 tablet 2    Sig: Take 1 tablet (40 mg total) by mouth daily.      Cardiovascular:  Antilipid - Statins Failed - 12/10/2019 11:47 AM      Failed - Total Cholesterol in normal range and within 360 days    Cholesterol, Total  Date Value Ref Range Status  01/10/2018 166 100 - 199 mg/dL Final   Cholesterol  Date Value Ref Range Status  12/12/2018 103 0 - 200 Final          Failed - LDL in normal range and within 360 days    LDL Calculated  Date Value Ref Range Status  01/10/2018 77 0 - 99 mg/dL Final   LDL Cholesterol  Date Value Ref Range Status  12/12/2018 45  Final          Failed - HDL in normal range and within 360 days    HDL  Date Value Ref Range Status  12/12/2018 40 35 - 70 Final  01/10/2018 30 (L) >39 mg/dL Final          Failed - Triglycerides in normal range and within 360 days    Triglycerides  Date Value Ref Range Status  12/12/2018 95 40 - 160 Final          Passed - Patient is not pregnant      Passed - Valid encounter within last 12 months    Recent Outpatient Visits           4 months ago Type 2 diabetes mellitus with diabetic nephropathy, without long-term current use of insulin ([place])   [facility] Birdie Sons, MD   10 months ago Mixed hyperlipidemia   [facility] Birdie Sons, MD   1 year ago Hypertension secondary to other renal disorders   [facility] Birdie Sons, MD   2 years ago [place], Donald E, MD   2 years ago Chronic, continuous use of opioids   [place], MD       Future Appointments             In 2 months Fisher, Kirstie Peri, MD [facility], [facility]              fenofibrate (TRICOR) 145 MG tablet      Sig: Take 1 tablet (145 mg total) by mouth daily.      Cardiovascular:  Antilipid - Fibric Acid Derivatives Failed - 12/10/2019 11:47 AM      Failed - Total Cholesterol in normal range and within 360 days    Cholesterol, Total  Date Value Ref Range Status  01/10/2018 166 100 - 199 mg/dL Final   Cholesterol  Date Value Ref Range Status  12/12/2018 103 0 - 200 Final          Failed - LDL in normal range and within 360 days    LDL Calculated  Date Value Ref Range Status  01/10/2018 77 0 - 99 mg/dL Final   LDL Cholesterol  Date Value Ref Range Status  12/12/2018 45  Final          Failed - HDL in normal range and within 360 days    HDL  Date Value Ref Range Status  12/12/2018 40 35 - 70 Final  01/10/2018 30 (L) >39 mg/dL Final          Failed - Triglycerides in normal range and within 360 days    Triglycerides  Date Value Ref Range Status  12/12/2018 95 40 - 160 Final          Failed - Cr in normal range and within 180 days    Creatinine  Date Value Ref Range Status  06/21/2013 9.88 (H) 0.60 - 1.30 mg/dL Final   Creatinine, Ser  Date Value Ref Range Status  09/17/2019 1.92 (H) 0.76 - 1.27 mg/dL Final          Failed - eGFR in normal range and within 180 days    EGFR (African American)  Date Value Ref Range Status  06/21/2013 6 (L)  Final   GFR calc Af Amer  Date Value Ref Range Status  09/17/2019 45 (L) >59 mL/min/1.73 Final    Comment:    **[facility] currently reports eGFR in compliance with the current**   recommendations of the [employer]. [facility] will   update reporting as new guidelines are published from the NKF-ASN   Task force.    EGFR (Non-African Amer.)  Date Value Ref Range Status  06/21/2013 6 (L)  Final    Comment:     eGFR values <72m/min/1.73 m2 may be an indication of chronic kidney disease (CKD). Calculated eGFR is useful in patients with stable renal function. The eGFR calculation will not be reliable in acutely ill patients when serum creatinine is changing rapidly. It is not useful in  patients on dialysis. The eGFR calculation may not be applicable to patients at the low and high extremes of body sizes, pregnant women, and vegetarians.    GFR calc non Af Amer  Date Value Ref Range Status  09/17/2019 39 (L) >59 mL/min/1.73 Final          Passed - ALT in normal range and within 180 days    ALT  Date Value Ref Range Status  09/17/2019 9 0 - 44 IU/L Final   SGPT (ALT)  Date Value Ref Range Status  06/21/2013 20 12 - 78 U/L Final          Passed - AST in normal range and within 180 days    AST  Date Value Ref Range Status  09/17/2019 9 0 - 40 IU/L Final   SGOT(AST)  Date Value Ref Range Status  06/21/2013 13 (L) 15 - 37 Unit/L Final          Passed - Valid encounter within last 12 months    Recent Outpatient Visits           4 months ago Type 2 diabetes mellitus with diabetic nephropathy, without long-term current use of insulin ([facility]   [facility]   10 months ago Mixed hyperlipidemia   [facility]   1 year ago Hypertension secondary to other renal disorders   [facility]   2 years ago [place], [place]   2 years ago Chronic, continuous use of opioids   [facility]  Future Appointments             In 2 months Fisher, Kirstie Peri, MD [facility], [place]

## 2019-12-15 NOTE — Telephone Encounter (Signed)
Pt is still waiting on the refills. Pt is requesting dr Caryn Section nurse to return his call

## 2019-12-15 NOTE — Telephone Encounter (Signed)
Pt has a F/U appt in Nov. Ok to send? He would like [place] to send it to mail order and local pharmacy [facility] [address] Please advise. Thanks!

## 2019-12-15 NOTE — Telephone Encounter (Signed)
I've never prescribed either one of these for this patient. Dr. Rockey Situ prescribed rosuvastatin so he needs to get refill from him. And I see any prescriptions for fenofibrate.

## 2019-12-17 DIAGNOSIS — L732 Hidradenitis suppurativa: Secondary | ICD-10-CM | POA: Diagnosis not present

## 2019-12-17 DIAGNOSIS — E669 Obesity, unspecified: Secondary | ICD-10-CM | POA: Diagnosis not present

## 2019-12-17 DIAGNOSIS — E785 Hyperlipidemia, unspecified: Secondary | ICD-10-CM | POA: Diagnosis not present

## 2019-12-17 DIAGNOSIS — D225 Melanocytic nevi of trunk: Secondary | ICD-10-CM | POA: Diagnosis not present

## 2019-12-17 DIAGNOSIS — E1165 Type 2 diabetes mellitus with hyperglycemia: Secondary | ICD-10-CM | POA: Diagnosis not present

## 2019-12-17 DIAGNOSIS — D2271 Melanocytic nevi of right lower limb, including hip: Secondary | ICD-10-CM | POA: Diagnosis not present

## 2019-12-17 DIAGNOSIS — D2261 Melanocytic nevi of right upper limb, including shoulder: Secondary | ICD-10-CM | POA: Diagnosis not present

## 2019-12-17 DIAGNOSIS — I10 Essential (primary) hypertension: Secondary | ICD-10-CM | POA: Diagnosis not present

## 2019-12-17 DIAGNOSIS — L570 Actinic keratosis: Secondary | ICD-10-CM | POA: Diagnosis not present

## 2019-12-17 DIAGNOSIS — D2262 Melanocytic nevi of left upper limb, including shoulder: Secondary | ICD-10-CM | POA: Diagnosis not present

## 2019-12-17 DIAGNOSIS — D408 Neoplasm of uncertain behavior of other specified male genital organs: Secondary | ICD-10-CM | POA: Diagnosis not present

## 2019-12-17 DIAGNOSIS — Z85828 Personal history of other malignant neoplasm of skin: Secondary | ICD-10-CM | POA: Diagnosis not present

## 2019-12-17 DIAGNOSIS — N2581 Secondary hyperparathyroidism of renal origin: Secondary | ICD-10-CM | POA: Diagnosis not present

## 2019-12-17 DIAGNOSIS — N189 Chronic kidney disease, unspecified: Secondary | ICD-10-CM | POA: Diagnosis not present

## 2019-12-18 ENCOUNTER — Other Ambulatory Visit: Payer: Self-pay | Admitting: Cardiovascular Disease

## 2019-12-18 MED ORDER — ROSUVASTATIN CALCIUM 40 MG PO TABS: 40.0000 mg | ORAL_TABLET | Freq: Every day | ORAL | 0 refills | Status: DC

## 2019-12-18 MED ORDER — FENOFIBRATE 145 MG PO TABS
145.0000 mg | ORAL_TABLET | Freq: Every day | ORAL | 0 refills | Status: DC
Start: 2019-12-18 — End: 2020-08-15

## 2019-12-18 NOTE — Telephone Encounter (Signed)
Requested Prescriptions   Signed Prescriptions Disp Refills   fenofibrate (TRICOR) 145 MG tablet 30 tablet 0    Sig: Take 1 tablet (145 mg total) by mouth daily.    Authorizing Provider: Minna Merritts    Ordering User: Raelene Bott, Jerry Haugen L   rosuvastatin (CRESTOR) 40 MG tablet 30 tablet 0    Sig: Take 1 tablet (40 mg total) by mouth daily.    Authorizing Provider: Minna Merritts    Ordering User: Raelene Bott, Laymon Stockert L

## 2019-12-18 NOTE — Telephone Encounter (Signed)
*  STAT* If patient is at the pharmacy, call can be transferred to refill team.   1. Which medications need to be refilled? (please list name of each medication and dose if known) rosuvastatin 40 mg and tri-core   2. Which pharmacy/location (including street and city if local pharmacy) is medication to be sent to? [facility] [address]  3. Do they need a 30 day or 90 day supply? 30  Patient is out of medication  Patient has scheduled an appointment 9/27

## 2019-12-18 NOTE — Telephone Encounter (Signed)
Pt will contact Dr Rockey Situ office

## 2019-12-20 NOTE — Progress Notes (Signed)
Cardiology Office Note    Date:  12/21/2019   ID:  James Moreno, DOB 1966/01/22, MRN [id number]  PCP:  Birdie Sons, MD  Cardiologist:  Ida Rogue, MD  Electrophysiologist:  None   Chief Complaint: Follow up  History of Present Illness:   James Moreno is a 54 y.o. male with history of ESRD previously on dialysis s/p renal transplant in 06/2014 in [place], [place], PAD with claudication, bilateral carotid artery disease, DM, COVID-19 in 10/2019, HTN, ongoing tobacco use, and GERD use who presents for follow up of chest pain and PAD.  Stress test from 06/2013 showed no significant ischemia with an EF of 57% and was overall a low risk scan. Echo from 01/2014 showed an EF of 60-65%, no RWMA, normal LV diastolic function, mildly dilated left atrium, mild mitral regurgitation, normal RVSF, and normal PASP.   He was most recently seen virtually by Dr. Rockey Moreno in 06/2018, reporting a 4-6 month history of chest pressure after eating and discomfort when laying down. He also noted his weight was trending up. He was noted to have stopped ASA. Lovastatin was changed to Crestor. His symptoms of chest discomfort were felt to be related to reflux.   With regards to his PAD, he has been followed by vascular surgery at [facility] with most recent noninvasive imaging, in 09/2018, demonstrating moderate bilateral lower extremity disease. He preferred noninvasive management at that time. Carotid artery ultrasound from 09/2018 showed < 50% bilateral ICA stenosis with bilateral antegrade flow of the vertebral arteries.   He has received both of his initial 2 Covid vaccines.  He was diagnosed with COVID-19 in 10/2019 and did not require hospital admission.  He comes in today doing well from a cardiac perspective.  He continues to note issues with dysphagia and chest discomfort particularly when eating late followed by laying down.  No exertional symptoms.  He wonders if he has a hiatal hernia.  He continues to smoke.   He would like to transition his carotid artery disease and PAD evaluations to our office.  He continues to have stable symptoms of claudication including having to stop and rest and difficulty with ambulating upstairs.   Labs independently reviewed: 08/2019 - BUN 25, SCr 1.92, potassium 4.5, albumin 4.3, AST/ALT normal, A1c 9.7, HGB 17.2, PLT 155 11/2018 - TSH normal, TC 103, TG 95, HDL 40, LDL 45  Past Medical History:  Diagnosis Date   Acute kidney failure, unspecified ([place])    Anxiety    Diabetes mellitus, type 2 (HCC)    GERD (gastroesophageal reflux disease)    History of hepatitis B    Hypertension    Hypothyroidism    Mitral valve disorders(424.0)    Pityriasis 12/27/2014   Primary pulmonary HTN ([place])    Pt denies ever having.   Tricuspid valve disorders, specified as nonrheumatic     Past Surgical History:  Procedure Laterality Date   APPENDECTOMY     Carotid Doppler Ultrasound  06/14/2009   39% stenosis of bilateral internal carotid artery, bilateral anterograde vertebral flow   ESOPHAGOGASTRODUODENOSCOPY (EGD) WITH PROPOFOL N/A 01/21/2019   Procedure: ESOPHAGOGASTRODUODENOSCOPY (EGD) WITH PROPOFOL;  Surgeon: Lin Landsman, MD;  Location: [facility];  Service: Endoscopy;  Laterality: N/A;  Diabetic - insulin   EYE SURGERY     right   HEMORROIDECTOMY     KIDNEY TRANSPLANT Right 2016   MECKEL DIVERTICULUM EXCISION     infancy   Myocardial Perfusion scan  01/17/2009  [facility], non- ischemic. LVEF= 55%   PARS PLANA VITRECTOMY Left 02/09/2015   Procedure: Pan retinal photocoagulation 93903;  Surgeon: Milus Height, MD;  Location: [facility];  Service: Ophthalmology;  Laterality: Left;   REFRACTIVE SURGERY Left    sleep study  01/09/2011   Severe sleep apnea. AHI 72.9/hr. RDI=83.0/hr. Desaturation to 69.0% Emergency CPAP titaration to 14.0cm (01/14/19 resolved after wt loss after kidney transplant.)    Current  Medications: Current Meds  Medication Sig   acetaminophen (TYLENOL) 500 MG tablet Take 500 mg by mouth every 6 (six) hours as needed (headaches.).   alprazolam (XANAX) 2 MG tablet Take 1 tablet (2 mg total) by mouth 3 (three) times daily as needed for sleep.   amLODipine (NORVASC) 10 MG tablet Take 10 mg by mouth daily.    B-D ULTRAFINE III SHORT PEN 31G X 8 MM MISC    BD INSULIN SYRINGE U/F 31G X 5/16" 1 ML MISC    Blood Glucose Monitoring Suppl (GLUCOCOM BLOOD GLUCOSE MONITOR) DEVI Frequency:ONCE   Dosage:0.0     Instructions:  Note:Dose: N/A   carvedilol (COREG) 6.25 MG tablet TAKE 1 TABLET TWICE A DAY WITH MEALS (NEED APPOINTMENT FOR FURTHER REFILLS) (Patient taking differently: Take 12.5 mg by mouth 2 (two) times daily with a meal. )   cinacalcet (SENSIPAR) 30 MG tablet Take 30 mg by mouth daily.   fenofibrate (TRICOR) 145 MG tablet Take 1 tablet (145 mg total) by mouth daily. (Patient taking differently: Take 0.5 mg by mouth daily. )   ibuprofen (ADVIL,MOTRIN) 200 MG tablet Take 200-400 mg by mouth every 8 (eight) hours as needed (for headaches.).   insulin aspart (NOVOLOG) 100 UNIT/ML injection Inject 10-16 Units into the skin 3 (three) times daily before meals. Sliding scale (01/14/19 - Pt uses 30 units TID with meals, plus SS of 2 units per 50 over 150)   Insulin Degludec (TRESIBA FLEXTOUCH Goff) Inject 30 Units into the skin daily with lunch.    Lancets (ONETOUCH DELICA PLUS [id number]) MISC    losartan (COZAAR) 50 MG tablet Take 50 mg by mouth at bedtime.    mycophenolate (MYFORTIC) 360 MG TBEC EC tablet Take 720 mg by mouth 2 (two) times daily.    naloxone (NARCAN) nasal spray 4 mg/0.1 mL Narcan 4 mg/actuation nasal spray   omeprazole (PRILOSEC) 20 MG capsule Take 2 capsules (40 mg total) by mouth 2 (two) times daily before a meal.   ONETOUCH VERIO test strip    oxyCODONE (OXY IR/ROXICODONE) 5 MG immediate release tablet Take 1-2 tablets (5-10 mg total) by mouth every  6 (six) hours as needed for severe pain.   rosuvastatin (CRESTOR) 40 MG tablet Take 1 tablet (40 mg total) by mouth daily.   sildenafil (VIAGRA) 100 MG tablet TAKE ONE-HALF (1/2) TO ONE TABLET DAILY AS NEEDED FOR ERECTILE DYSFUNCTION   tacrolimus (PROGRAF) 1 MG capsule Take 3 mg by mouth 2 (two) times daily.    triamcinolone cream (KENALOG) 0.1 % APPLY DAILY TO INFLAMED BUMPS AS NEEDED    Allergies:   No known allergies   Social History   Socioeconomic History   Marital status: Legally Separated    Spouse name: Not on file   Number of children: 1   Years of education: Not on file   Highest education level: Associate degree: occupational, Hotel manager, or vocational program  Occupational History   Occupation: Insurance account manager    Comment: on disability  Tobacco Use   Smoking status: Current Every Day  Smoker    Packs/day: 1.00    Years: 38.00    Pack years: 38.00    Types: Cigarettes   Smokeless tobacco: Never Used   Tobacco comment: since age 72.  Vaping Use   Vaping Use: Former  Substance and Sexual Activity   Alcohol use: Not on file    Comment: Excessive alcohol consumption in the past. Quit around 2016   Drug use: No   Sexual activity: Not on file  Other Topics Concern   Not on file  Social History Narrative   Not on file   Social Determinants of Health   Financial Resource Strain:    Difficulty of Paying Living Expenses: Not on file  Food Insecurity:    Worried About [place] in the Last Year: Not on file   Ran Out of Food in the Last Year: Not on file  Transportation Needs:    Lack of Transportation (Medical): Not on file   Lack of Transportation (Non-Medical): Not on file  Physical Activity: Inactive   Days of Exercise per Week: 0 days   Minutes of Exercise per Session: 0 min  Stress:    Feeling of Stress : Not on file  Social Connections:    Frequency of Communication with Friends and Family: Not on file   Frequency of  Social Gatherings with Friends and Family: Not on file   Attends Religious Services: Not on file   Active Member of Clubs or Organizations: Not on file   Attends Archivist Meetings: Not on file   Marital Status: Not on file     Family History:  The patient's family history includes Hyperlipidemia in his mother; Hypertension in his mother; Melanoma in his father.  ROS:   Review of Systems  Constitutional: Negative for chills, diaphoresis, fever, malaise/fatigue and weight loss.  HENT: Negative for congestion.   Eyes: Negative for discharge and redness.  Respiratory: Negative for cough, sputum production, shortness of breath and wheezing.   Cardiovascular: Positive for chest pain and claudication. Negative for palpitations, orthopnea, leg swelling and PND.  Gastrointestinal: Positive for heartburn. Negative for abdominal pain, nausea and vomiting.       Dysphagia  Musculoskeletal: Negative for falls and myalgias.  Skin: Negative for rash.  Neurological: Negative for dizziness, tingling, tremors, sensory change, speech change, focal weakness, loss of consciousness and weakness.  Endo/Heme/Allergies: Does not bruise/bleed easily.  Psychiatric/Behavioral: Negative for substance abuse. The patient is not nervous/anxious.   All other systems reviewed and are negative.    EKGs/Labs/Other Studies Reviewed:    Studies reviewed were summarized above. The additional studies were reviewed today: As above.   EKG:  EKG is ordered today.  The EKG ordered today demonstrates NSR, 97 bpm, and occluded prior septal infarct, no acute ST-T changes, not significantly changed when compared to prior tracing  Recent Labs: 09/17/2019: ALT 9; BUN 25; Creatinine, Ser 1.92; Potassium 4.5; Sodium 132  Recent Lipid Panel    Component Value Date/Time   CHOL 103 12/12/2018 0000   CHOL 166 01/10/2018 0821   TRIG 95 12/12/2018 0000   HDL 40 12/12/2018 0000   HDL 30 (L) 01/10/2018 0821   CHOLHDL  5.5 (H) 01/10/2018 0821   LDLCALC 45 12/12/2018 0000   LDLCALC 77 01/10/2018 0821    PHYSICAL EXAM:    VS:  BP (!) 160/64 (BP Location: Left Arm, Patient Position: Sitting, Cuff Size: Normal)    Pulse 97    Ht 5\' 11"  (1.803  m)    Wt 214 lb 2 oz (97.1 kg)    SpO2 98%    BMI 29.86 kg/m   BMI: Body mass index is 29.86 kg/m.  Physical Exam Constitutional:      Appearance: He is well-developed.  HENT:     Head: Normocephalic and atraumatic.  Eyes:     General:        Right eye: No discharge.        Left eye: No discharge.  Neck:     Vascular: No JVD.  Cardiovascular:     Rate and Rhythm: Normal rate and regular rhythm.     Pulses: No midsystolic click and no opening snap.          Posterior tibial pulses are 1+ on the right side and 1+ on the left side.     Heart sounds: Normal heart sounds, S1 normal and S2 normal. Heart sounds not distant. No murmur heard.  No friction rub.  Pulmonary:     Effort: Pulmonary effort is normal. No respiratory distress.     Breath sounds: Normal breath sounds. No decreased breath sounds, wheezing or rales.  Chest:     Chest wall: No tenderness.  Abdominal:     General: There is no distension.     Palpations: Abdomen is soft.     Tenderness: There is no abdominal tenderness.  Musculoskeletal:     Cervical back: Normal range of motion.  Skin:    General: Skin is warm and dry.     Nails: There is no clubbing.  Neurological:     Mental Status: He is alert and oriented to person, place, and time.  Psychiatric:        Speech: Speech normal.        Behavior: Behavior normal.        Thought Content: Thought content normal.        Judgment: Judgment normal.     Wt Readings from Last 3 Encounters:  12/21/19 214 lb 2 oz (97.1 kg)  01/21/19 225 lb (102.1 kg)  01/19/19 230 lb 3.2 oz (104.4 kg)     ASSESSMENT & PLAN:   1. Chest pain with moderate risk for cardiac etiology: Currently chest pain-free.  No exertional symptoms.  Overall symptoms  seem to be fairly atypical though cannot exclude angina decubitus versus reflux.  Schedule Lexiscan MPI to evaluate for high risk ischemia.  If this is unrevealing recommend he follow-up with GI.  2. PAD/carotid artery disease: He continues to have stable symptoms of claudication with prior noninvasive imaging demonstrating moderate lower extremity arterial disease.  Update lower extremity noninvasive imaging and carotid artery ultrasound.  He remains off aspirin secondary to bruising.  This will likely need to be revisited in follow-up.  Continue Crestor as outlined below.  3. ESRD: Previously on dialysis.  Status post renal transplant.  Followed by nephrology.  4. HTN: Blood pressure mildly elevated in the office today.  Titrate losartan to 100 mg daily.  Continue carvedilol and amlodipine.  If BP remains elevated and follow-up recommend escalation of carvedilol to 12.5 mg twice daily.  5. HLD: LDL 45 from 11/2018 with normal LFT from 08/2019.  Continue Crestor and fenofibrate.  Consider fasting lipid panel in follow-up.  6. Dysphagia: Recommend he follow-up with GI.   Disposition: F/u with Dr. Rockey Moreno or an APP in 2 months.   Medication Adjustments/Labs and Tests Ordered: Current medicines are reviewed at length with the patient today.  Concerns regarding medicines  are outlined above. Medication changes, Labs and Tests ordered today are summarized above and listed in the Patient Instructions accessible in Encounters.   Signed, Christell Faith, PA-C 12/21/2019 11:25 AM     [facility] [address] [place], [place] [address] [phone]

## 2019-12-21 ENCOUNTER — Other Ambulatory Visit: Payer: Self-pay

## 2019-12-21 ENCOUNTER — Ambulatory Visit: Payer: Medicare HMO | Admitting: Physician Assistant

## 2019-12-21 ENCOUNTER — Encounter: Payer: Self-pay | Admitting: Physician Assistant

## 2019-12-21 VITALS — BP 160/64 | HR 97 | Ht 71.0 in | Wt 214.1 lb

## 2019-12-21 DIAGNOSIS — R079 Chest pain, unspecified: Secondary | ICD-10-CM

## 2019-12-21 DIAGNOSIS — I10 Essential (primary) hypertension: Secondary | ICD-10-CM | POA: Diagnosis not present

## 2019-12-21 DIAGNOSIS — Z940 Kidney transplant status: Secondary | ICD-10-CM | POA: Diagnosis not present

## 2019-12-21 DIAGNOSIS — I739 Peripheral vascular disease, unspecified: Secondary | ICD-10-CM | POA: Diagnosis not present

## 2019-12-21 DIAGNOSIS — N186 End stage renal disease: Secondary | ICD-10-CM

## 2019-12-21 DIAGNOSIS — E785 Hyperlipidemia, unspecified: Secondary | ICD-10-CM

## 2019-12-21 DIAGNOSIS — R1310 Dysphagia, unspecified: Secondary | ICD-10-CM

## 2019-12-21 DIAGNOSIS — I779 Disorder of arteries and arterioles, unspecified: Secondary | ICD-10-CM

## 2019-12-21 MED ORDER — ROSUVASTATIN CALCIUM 40 MG PO TABS: 40.0000 mg | ORAL_TABLET | Freq: Every day | ORAL | 2 refills | Status: DC

## 2019-12-21 MED ORDER — LOSARTAN POTASSIUM 100 MG PO TABS
100.0000 mg | ORAL_TABLET | Freq: Every day | ORAL | 2 refills | Status: DC
Start: 2019-12-21 — End: 2021-04-10

## 2019-12-21 NOTE — Patient Instructions (Addendum)
Medication Instructions:  Your physician has recommended you make the following change in your medication:   INCREASE Losartan to 100 mg daily. An Rx has been sent to your pharmacy.  *If you need a refill on your cardiac medications before your next appointment, please call your pharmacy*   Lab Work: None ordered If you have labs (blood work) drawn today and your tests are completely normal, you will receive your results only by: Marland Kitchen MyChart Message (if you have MyChart) OR . A paper copy in the mail If you have any lab test that is abnormal or we need to change your treatment, we will call you to review the results.   Testing/Procedures: Your physician has requested that you have a carotid duplex. This test is an ultrasound of the carotid arteries in your neck. It looks at blood flow through these arteries that supply the brain with blood. Allow one hour for this exam. There are no restrictions or special instructions.  Your physician has requested that you have a lexiscan myoview. For further information please visit [email or web address]. Please follow instruction sheet, as given.  Your physician has requested that you have a lower extremity arterial exercise duplex. During this test, exercise and ultrasound are used to evaluate arterial blood flow in the legs. Allow one hour for this exam. There are no restrictions or special instructions.  Your physician has requested that you have an ankle brachial index (ABI). During this test an ultrasound and blood pressure cuff are used to evaluate the arteries that supply the arms and legs with blood. Allow thirty minutes for this exam. There are no restrictions or special instructions.     Follow-Up: At [facility], you and your health needs are our priority.  As part of our continuing mission to provide you with exceptional heart care, we have created designated Provider Care Teams.  These Care Teams include your primary Cardiologist  (physician) and Advanced Practice Providers (APPs -  Physician Assistants and Nurse Practitioners) who all work together to provide you with the care you need, when you need it.  We recommend signing up for the patient portal called "MyChart".  Sign up information is provided on this After Visit Summary.  MyChart is used to connect with patients for Virtual Visits (Telemedicine).  Patients are able to view lab/test results, encounter notes, upcoming appointments, etc.  Non-urgent messages can be sent to your provider as well.   To learn more about what you can do with MyChart, go to [email or web address].    Your next appointment:   2 month(s)  The format for your next appointment:   In Person  Provider:   You may see Ida Rogue, MD or one of the following Advanced Practice Providers on your designated Care Team:    Murray Hodgkins, NP  Christell Faith, PA-C  Marrianne Mood, PA-C  Cadence Kathlen Mody, [place]    Other Instructions  [place]  Your caregiver has ordered a Stress Test with nuclear imaging. The purpose of this test is to evaluate the blood supply to your heart muscle. This procedure is referred to as a "Non-Invasive Stress Test." This is because other than having an IV started in your vein, nothing is inserted or "invades" your body. Cardiac stress tests are done to find areas of poor blood flow to the heart by determining the extent of coronary artery disease (CAD). Some patients exercise on a treadmill, which naturally increases the blood flow to your heart, while others who are  unable  to walk on a treadmill due to physical limitations have a pharmacologic/chemical stress agent called Lexiscan . This medicine will mimic walking on a treadmill by temporarily increasing your coronary blood flow.   Please note: these test may take anywhere between 2-4 hours to complete  PLEASE REPORT TO [facility] AT THE FIRST DESK WILL DIRECT YOU WHERE TO  GO  Date of Procedure:_____________________________________  Arrival Time for Procedure:______________________________  Instructions regarding medication:   _X___ : Hold diabetes medication morning of procedure  __X__:  Hold betablocker(Carvedilol) night before procedure and morning of procedure   PLEASE NOTIFY THE OFFICE AT LEAST 24 HOURS IN ADVANCE IF YOU ARE UNABLE TO KEEP YOUR APPOINTMENT.  [phone] AND  PLEASE NOTIFY NUCLEAR MEDICINE AT [facility] AT LEAST 24 HOURS IN ADVANCE IF YOU ARE UNABLE TO KEEP YOUR APPOINTMENT. [phone]  How to prepare for your Myoview test:  1. Do not eat or drink after midnight 2. No caffeine for 24 hours prior to test 3. No smoking 24 hours prior to test. 4. Your medication may be taken with water.  If your doctor stopped a medication because of this test, do not take that medication. 5. Please wear a short sleeve shirt. 6. No cologne or lotion. 7. Wear comfortable walking shoes. No heels!

## 2019-12-24 ENCOUNTER — Telehealth: Payer: Self-pay | Admitting: Cardiovascular Disease

## 2019-12-24 DIAGNOSIS — E1165 Type 2 diabetes mellitus with hyperglycemia: Secondary | ICD-10-CM | POA: Diagnosis not present

## 2019-12-24 DIAGNOSIS — I7389 Other specified peripheral vascular diseases: Secondary | ICD-10-CM | POA: Diagnosis not present

## 2019-12-24 DIAGNOSIS — N186 End stage renal disease: Secondary | ICD-10-CM | POA: Diagnosis not present

## 2019-12-24 DIAGNOSIS — I10 Essential (primary) hypertension: Secondary | ICD-10-CM | POA: Diagnosis not present

## 2019-12-24 DIAGNOSIS — E118 Type 2 diabetes mellitus with unspecified complications: Secondary | ICD-10-CM | POA: Diagnosis not present

## 2019-12-24 DIAGNOSIS — E785 Hyperlipidemia, unspecified: Secondary | ICD-10-CM | POA: Diagnosis not present

## 2019-12-24 DIAGNOSIS — Z940 Kidney transplant status: Secondary | ICD-10-CM | POA: Diagnosis not present

## 2019-12-24 DIAGNOSIS — E1121 Type 2 diabetes mellitus with diabetic nephropathy: Secondary | ICD-10-CM | POA: Diagnosis not present

## 2019-12-24 DIAGNOSIS — N2581 Secondary hyperparathyroidism of renal origin: Secondary | ICD-10-CM | POA: Diagnosis not present

## 2019-12-24 DIAGNOSIS — E669 Obesity, unspecified: Secondary | ICD-10-CM | POA: Diagnosis not present

## 2019-12-24 DIAGNOSIS — N189 Chronic kidney disease, unspecified: Secondary | ICD-10-CM | POA: Diagnosis not present

## 2019-12-24 NOTE — Telephone Encounter (Signed)
I have not seen anything come through in nurse bin as of today.

## 2019-12-24 NOTE — Telephone Encounter (Signed)
Patient calling in stating that there should be some forms coming trhough the fax from [facility]. Patient is trying to reduce his medication tier so he can reduce his cost. Patient would like for these to be looked over and returned to [facility]

## 2019-12-25 ENCOUNTER — Telehealth: Payer: Self-pay | Admitting: Cardiovascular Disease

## 2019-12-25 NOTE — Telephone Encounter (Signed)
Spoke with James Moreno at [facility] to answer clinical questions Re: fenofibrate. Rep states we will receive a fax after determination.

## 2019-12-25 NOTE — Telephone Encounter (Signed)
Prior auth questions from [place] for fenofibrate

## 2019-12-28 ENCOUNTER — Other Ambulatory Visit: Payer: Self-pay | Admitting: Family Medicine

## 2019-12-28 DIAGNOSIS — M79604 Pain in right leg: Secondary | ICD-10-CM

## 2019-12-28 DIAGNOSIS — R52 Pain, unspecified: Secondary | ICD-10-CM

## 2019-12-28 DIAGNOSIS — L905 Scar conditions and fibrosis of skin: Secondary | ICD-10-CM

## 2019-12-28 MED ORDER — OXYCODONE HCL 5 MG PO TABS: 5.0000 mg | ORAL_TABLET | Freq: Four times a day (QID) | ORAL | 0 refills | Status: DC | PRN

## 2019-12-28 NOTE — Telephone Encounter (Signed)
Having Brandy review packet to see if tier exception is possible for this patient.

## 2019-12-28 NOTE — Telephone Encounter (Signed)
Tier exception can not be given when medication has been denied coverage. Spoke with pharmacy tech at [facility] day supply of fenofibrate is $9.

## 2019-12-28 NOTE — Telephone Encounter (Signed)
Duplicate entry. See other telephone encounter regarding medication cost and left message for patient to call back.

## 2019-12-28 NOTE — Telephone Encounter (Signed)
Left voicemail message for patient to call back.  30 day supply at [facility] is $9.00 cash price using goodrx.

## 2019-12-28 NOTE — Telephone Encounter (Signed)
Requested medication (s) are due for refill today: yes   Requested medication (s) are on the active medication list: yes   Last refill:  11/30/19 #240 0 refills  Future visit scheduled: yes 02/12/20  Notes to clinic:  not delegated per protocol     Requested Prescriptions  Pending Prescriptions Disp Refills   oxyCODONE (OXY IR/ROXICODONE) 5 MG immediate release tablet 240 tablet 0    Sig: Take 1-2 tablets (5-10 mg total) by mouth every 6 (six) hours as needed for severe pain.      Not Delegated - Analgesics:  Opioid Agonists Failed - 12/28/2019  9:27 AM      Failed - This refill cannot be delegated      Failed - Urine Drug Screen completed in last 360 days.      Passed - Valid encounter within last 6 months    Recent Outpatient Visits           4 months ago Type 2 diabetes mellitus with diabetic nephropathy, without long-term current use of insulin [facility])   [facility] Birdie Sons, MD   11 months ago Mixed hyperlipidemia   [facility] Birdie Sons, MD   1 year ago Hypertension secondary to other renal disorders   [facility] [place] [facility] Birdie Sons, MD   2 years ago [place], Donald E, MD   2 years ago Chronic, continuous use of opioids   [facility] Birdie Sons, MD       Future Appointments             In 1 month Fisher, Kirstie Peri, MD [facility], [place]   In 1 month [place], Kathlene November, MD [facility] [place]

## 2019-12-28 NOTE — Telephone Encounter (Signed)
Medication Refill - Medication: oxyCODONE (OXY IR/ROXICODONE) 5 MG immediate release tablet   Has the patient contacted their pharmacy? No. (Agent: If no, request that the patient contact the pharmacy for the refill.) (Agent: If yes, when and what did the pharmacy advise?)  Preferred Pharmacy (with phone number or street name): [facility] [address], [place] - [place]  [address] [place] [address]  Phone:  [phone] Fax:  [phone]   Agent: Please be advised that RX refills may take up to 3 business days. We ask that you follow-up with your pharmacy.

## 2019-12-29 NOTE — Telephone Encounter (Signed)
Reviewed that some of his medications are cheaper through goodrx.com than through insurance. Reviewed medication in question his fenofibrate is $9.00 per month cash price using good rx. Recommended he download the app to his phone and check all of his prescriptions because he may pay much less using that. He verbalized understanding of our conversation, agreement to check the app, and had no further questions at this time.

## 2020-01-04 ENCOUNTER — Other Ambulatory Visit: Payer: Self-pay | Admitting: Physician Assistant

## 2020-01-04 ENCOUNTER — Ambulatory Visit (INDEPENDENT_AMBULATORY_CARE_PROVIDER_SITE_OTHER): Payer: Medicare HMO

## 2020-01-04 ENCOUNTER — Other Ambulatory Visit: Payer: Self-pay

## 2020-01-04 ENCOUNTER — Telehealth: Payer: Self-pay | Admitting: Gastroenterology

## 2020-01-04 DIAGNOSIS — I6523 Occlusion and stenosis of bilateral carotid arteries: Secondary | ICD-10-CM

## 2020-01-04 DIAGNOSIS — I779 Disorder of arteries and arterioles, unspecified: Secondary | ICD-10-CM

## 2020-01-04 DIAGNOSIS — I739 Peripheral vascular disease, unspecified: Secondary | ICD-10-CM

## 2020-01-04 NOTE — Telephone Encounter (Signed)
Patient believes he has developed Hiatal Hernia. His patient has an appt on 03/15/20 . He wants to know if he can be seen sooner. Provider was informed.

## 2020-01-06 ENCOUNTER — Telehealth: Payer: Self-pay

## 2020-01-06 ENCOUNTER — Telehealth: Payer: Self-pay | Admitting: Family Medicine

## 2020-01-06 DIAGNOSIS — K449 Diaphragmatic hernia without obstruction or gangrene: Secondary | ICD-10-CM

## 2020-01-06 NOTE — Telephone Encounter (Signed)
Called and left a message for call back  

## 2020-01-06 NOTE — Telephone Encounter (Signed)
Called and left a message for call back. Order the upper GI series. Called and got patient scheduled for 01/12/2020 [facility] [place] medical mall at 8:00am for a 8:30am scan. Nothing to eat or drink after midnight.

## 2020-01-06 NOTE — Telephone Encounter (Signed)
Copied from [place] [id number]. Topic: Medicare AWV >> Jan 06, 2020  9:25 AM Cher Nakai R wrote: Reason for CRM:   Left message for patient to call back and schedule Medicare Annual Wellness Visit (AWV) either virtually or in office.  Last AWV 01/06/2019  Please schedule at anytime with [facility] Health Advisor.  If any questions, please contact me at [phone]

## 2020-01-06 NOTE — Telephone Encounter (Signed)
-----   Message from Lin Landsman, MD sent at 01/05/2020  8:52 AM EDT ----- Regarding: RE: Hiatal Hernia James Moreno  Please order upper GI series to evaluate hiatal hernia   Elmyra Ricks, he can be on priority list. Not urgent to be seen for hiatal hernia  RV ----- Message ----- From: Royston Bake Sent: 01/04/2020   5:20 PM EDT To: Lin Landsman, MD Subject: Hiatal Hernia                                  Patient believes he has developed Hiatal Hernia. His patient has an appt on 03/15/20 with you. He wants to know if he can be seen sooner.

## 2020-01-07 ENCOUNTER — Telehealth: Payer: Self-pay | Admitting: Cardiovascular Disease

## 2020-01-07 ENCOUNTER — Telehealth: Payer: Self-pay | Admitting: Nurse Practitioner

## 2020-01-07 DIAGNOSIS — I739 Peripheral vascular disease, unspecified: Secondary | ICD-10-CM

## 2020-01-07 MED ORDER — ASPIRIN EC 81 MG PO TBEC: 81.0000 mg | DELAYED_RELEASE_TABLET | Freq: Every day | ORAL | Status: DC

## 2020-01-07 NOTE — Telephone Encounter (Signed)
LMOV to schedule new patient appointment with Dr Fletcher Anon Placed 01/21/20 at 9:00am on hold for patient if nothing else comes available

## 2020-01-07 NOTE — Telephone Encounter (Signed)
-----   Message from Emily Filbert, RN sent at 01/07/2020 10:27 AM EDT ----- Referral placed for Dr. Fletcher Anon for [facility] consult for lower arterial disease.  He would like soonest available, but will be out of town from 10/24-10/31.   Thanks!

## 2020-01-07 NOTE — Telephone Encounter (Signed)
Patient verbalized understanding of the scan. Moved patient appointment to 01/13/2020 with Dr, Marius Ditch

## 2020-01-07 NOTE — Telephone Encounter (Signed)
1) Lower arterial duplex:  Theora Gianotti, NP  01/06/2020 4:54 PM EDT     Abnormal ABI's and lower ext arterial duplex. Multiple areas of blockage noted in bilateral legs. Recommend consult with Dr. Fletcher Anon to eval and discuss mgmt options further.    2) Carotid duplex:  Theora Gianotti, NP  01/06/2020 5:02 PM EDT     Moderate carotid stenosis - right more so than L. F/u carotid u/s in 1 yr. If not already taking, he should be on aspirin 81mg  daily.

## 2020-01-07 NOTE — Telephone Encounter (Signed)
Attempted to call the patient. No answer- I left a message for him to please call back.  As I was writing his note, he did call back.  I have reviewed the patient's carotid US & lower arterial US with him. He is aware of recommendations to: 1) Start an aspirin 81 mg once daily 2) have a referral to Dr. Fletcher Anon regarding his bilateral lower arterial disease  The patient voices understanding of the above recommendations and is agreeable.   He is aware I will place his referral to Dr. Fletcher Anon and scheduling will reach out to him to arrange.

## 2020-01-11 ENCOUNTER — Other Ambulatory Visit: Payer: Self-pay

## 2020-01-12 ENCOUNTER — Other Ambulatory Visit: Payer: Self-pay

## 2020-01-12 ENCOUNTER — Other Ambulatory Visit: Payer: Self-pay | Admitting: Gastroenterology

## 2020-01-12 ENCOUNTER — Ambulatory Visit
Admission: RE | Admit: 2020-01-12 | Discharge: 2020-01-12 | Disposition: A | Discharge status: Home / Self Care | Payer: Medicare HMO | Source: Ambulatory Visit | Attending: Gastroenterology | Admitting: Gastroenterology

## 2020-01-12 DIAGNOSIS — K449 Diaphragmatic hernia without obstruction or gangrene: Secondary | ICD-10-CM | POA: Insufficient documentation

## 2020-01-12 DIAGNOSIS — K30 Functional dyspepsia: Secondary | ICD-10-CM | POA: Diagnosis not present

## 2020-01-12 DIAGNOSIS — K219 Gastro-esophageal reflux disease without esophagitis: Secondary | ICD-10-CM | POA: Diagnosis not present

## 2020-01-12 DIAGNOSIS — K224 Dyskinesia of esophagus: Secondary | ICD-10-CM | POA: Diagnosis not present

## 2020-01-13 ENCOUNTER — Encounter: Payer: Self-pay | Admitting: Gastroenterology

## 2020-01-13 ENCOUNTER — Ambulatory Visit: Payer: Medicare HMO | Admitting: Gastroenterology

## 2020-01-13 VITALS — BP 151/79 | HR 87 | Temp 97.5°F | Ht 71.0 in | Wt 211.5 lb

## 2020-01-13 DIAGNOSIS — R1013 Epigastric pain: Secondary | ICD-10-CM

## 2020-01-13 DIAGNOSIS — R748 Abnormal levels of other serum enzymes: Secondary | ICD-10-CM

## 2020-01-13 DIAGNOSIS — K219 Gastro-esophageal reflux disease without esophagitis: Secondary | ICD-10-CM | POA: Diagnosis not present

## 2020-01-13 NOTE — Patient Instructions (Signed)

## 2020-01-13 NOTE — Progress Notes (Signed)
James Darby, MD [address]  [place]  [place], [name] [address]  Main: [phone]  Fax: [phone]    Gastroenterology Consultation  Referring Provider:     Birdie Sons, MD Primary Care Physician:  Birdie Sons, MD Primary Gastroenterologist:  Dr. Cephas Moreno Reason for Consultation: Epigastric pain, reflux        HPI:   James Moreno is a 54 y.o. male referred by Dr. Birdie Sons, MD  for consultation & management of reflux, dysphagia.  Patient has history of metabolic syndrome, status post kidney transplant in 2016, secondary to diabetic nephropathy resulting in end-stage renal failure.  Patient reports for last 5 to 6 months, he has been experiencing significant regurgitation, heartburn, food not passing down in his mid chest.  He felt severe chest pressure, evaluated by cardiology as he was concerned about heart disease and it was ruled out.  He started on omeprazole 20 mg twice daily which seems to alleviate symptoms partially.  He also reports nocturnal heartburn, waking up with coughing spells and choking episodes.  Reflux symptoms worse if his last meal is after 6 PM.  He continues to smoke cigarettes, 1 pack last for 2 days.  Alcohol occasionally.  He is requesting upper endoscopy for further evaluation Patient was also evaluated by bariatric surgery team at [facility] for sleeve gastrectomy and currently under work-up today.  He lost about 9 pounds with diet modification  Patient underwent Cologuard testing 2019 and it was negative.  He prefers not to undergo colonoscopy for colon cancer screening History of hep B, spontaneously cleared  Follow-up visit 01/13/2020 He reports that, for last few months, he has been experiencing epigastric pain, reflux, early satiety, upper abdominal bloating.  He has history of diabetes, hemoglobin A1c has been worsening, 9.7 on 09/17/2019, last year it was 6.7.  He said he has been going through significant stress and  was eating out of control.  He is now trying to lose weight and managing his blood sugars with insulin.  He reports that his blood sugars are in the 120s.  He noticed that white bread was causing choking sensation, which was relieved after he stopped eating.  He is currently taking omeprazole 20 mg twice daily.  Patient was worried that he may had a hiatal hernia.  Therefore, I ordered x-ray upper GI series prior to this follow-up visit which revealed moderate reflux and possible delayed gastric emptying.  Normal hemoglobin.  Patient denies oral thrush, pain during swallowing.  He is trying to lose weight by following healthy diet.  NSAIDs: None  Antiplts/Anticoagulants/Anti thrombotics: None  GI Procedures: EGD 01/21/2019 - Normal duodenal bulb and second portion of the duodenum. - Congested and inflamed mucosa in the prepyloric region of the stomach. Biopsied. - Normal gastric body, incisura and antrum. Biopsied. - Esophagogastric landmarks identified. - Normal gastroesophageal junction and esophagus. Biopsied.    He denies family history of GI malignancy  Past Medical History:  Diagnosis Date  . Acute kidney failure, unspecified ([place])   . Anxiety   . Diabetes mellitus, type 2 ([place])   . GERD (gastroesophageal reflux disease)   . History of hepatitis B   . Hypertension   . Hypothyroidism   . Mitral valve disorders(424.0)   . Pityriasis 12/27/2014  . Primary pulmonary HTN ([place])    Pt denies ever having.  . Tricuspid valve disorders, specified as nonrheumatic     Past Surgical History:  Procedure Laterality Date  .  APPENDECTOMY    . Carotid Doppler Ultrasound  06/14/2009   39% stenosis of bilateral internal carotid artery, bilateral anterograde vertebral flow  . ESOPHAGOGASTRODUODENOSCOPY (EGD) WITH PROPOFOL N/A 01/21/2019   Procedure: ESOPHAGOGASTRODUODENOSCOPY (EGD) WITH PROPOFOL;  Surgeon: Lin Landsman, MD;  Location: [facility];  Service: Endoscopy;   Laterality: N/A;  Diabetic - insulin  . EYE SURGERY     right  . HEMORROIDECTOMY    . KIDNEY TRANSPLANT Right 2016  . MECKEL DIVERTICULUM EXCISION     infancy  . Myocardial Perfusion scan  01/17/2009   [facility] Transplant, non- ischemic. LVEF= 55%  . PARS PLANA VITRECTOMY Left 02/09/2015   Procedure: Pan retinal photocoagulation 25956;  Surgeon: Milus Height, MD;  Location: [facility];  Service: Ophthalmology;  Laterality: Left;  . REFRACTIVE SURGERY Left   . sleep study  01/09/2011   Severe sleep apnea. AHI 72.9/hr. RDI=83.0/hr. Desaturation to 69.0% Emergency CPAP titaration to 14.0cm (01/14/19 resolved after wt loss after kidney transplant.)    Current Outpatient Medications:  .  acetaminophen (TYLENOL) 500 MG tablet, Take 500 mg by mouth every 6 (six) hours as needed (headaches.)., Disp: , Rfl:  .  alprazolam (XANAX) 2 MG tablet, Take 1 tablet (2 mg total) by mouth 3 (three) times daily as needed for sleep., Disp: 90 tablet, Rfl: 2 .  amLODipine (NORVASC) 10 MG tablet, Take 10 mg by mouth daily. , Disp: , Rfl:  .  B-D ULTRAFINE III SHORT PEN 31G X 8 MM MISC, , Disp: , Rfl:  .  BD INSULIN SYRINGE U/F 31G X 5/16" 1 ML MISC, , Disp: , Rfl:  .  Blood Glucose Monitoring Suppl (GLUCOCOM BLOOD GLUCOSE MONITOR) DEVI, Frequency:ONCE   Dosage:0.0     Instructions:  Note:Dose: N/A, Disp: , Rfl:  .  calcitRIOL (ROCALTROL) 0.25 MCG capsule, , Disp: , Rfl:  .  carvedilol (COREG) 6.25 MG tablet, TAKE 1 TABLET TWICE A DAY WITH MEALS (NEED APPOINTMENT FOR FURTHER REFILLS) (Patient taking differently: Take 12.5 mg by mouth 2 (two) times daily with a meal. ), Disp: 180 tablet, Rfl: 1 .  cinacalcet (SENSIPAR) 30 MG tablet, Take 30 mg by mouth daily., Disp: , Rfl:  .  Continuous Blood Gluc Receiver (FREESTYLE LIBRE 14 DAY READER) DEVI, , Disp: , Rfl:  .  Continuous Blood Gluc Sensor (FREESTYLE LIBRE 14 DAY SENSOR) MISC, , Disp: , Rfl:  .  EDEX 40 MCG injection, , Disp: , Rfl:  .  fenofibrate (TRICOR) 145  MG tablet, Take 1 tablet (145 mg total) by mouth daily. (Patient taking differently: Take 0.5 mg by mouth daily. ), Disp: 30 tablet, Rfl: 0 .  ibuprofen (ADVIL,MOTRIN) 200 MG tablet, Take 200-400 mg by mouth every 8 (eight) hours as needed (for headaches.)., Disp: , Rfl:  .  insulin aspart (NOVOLOG) 100 UNIT/ML injection, Inject 10-16 Units into the skin 3 (three) times daily before meals. Sliding scale (01/14/19 - Pt uses 30 units TID with meals, plus SS of 2 units per 50 over 150), Disp: , Rfl:  .  Insulin Degludec (TRESIBA FLEXTOUCH [place]), Inject 30 Units into the skin daily with lunch. , Disp: , Rfl:  .  Lancets (ONETOUCH DELICA PLUS LOVFIE33I) MISC, , Disp: , Rfl:  .  losartan (COZAAR) 100 MG tablet, Take 1 tablet (100 mg total) by mouth at bedtime., Disp: 90 tablet, Rfl: 2 .  mycophenolate (MYFORTIC) 360 MG TBEC EC tablet, Take 720 mg by mouth 2 (two) times daily. , Disp: , Rfl:  .  naloxone (NARCAN) nasal spray 4 mg/0.1 mL, Narcan 4 mg/actuation nasal spray, Disp: , Rfl:  .  omeprazole (PRILOSEC) 20 MG capsule, Take 2 capsules (40 mg total) by mouth 2 (two) times daily before a meal., Disp: 360 capsule, Rfl: 4 .  ONETOUCH VERIO test strip, , Disp: , Rfl:  .  oxyCODONE (OXY IR/ROXICODONE) 5 MG immediate release tablet, Take 1-2 tablets (5-10 mg total) by mouth every 6 (six) hours as needed for severe pain., Disp: 240 tablet, Rfl: 0 .  rosuvastatin (CRESTOR) 40 MG tablet, Take 1 tablet (40 mg total) by mouth daily., Disp: 90 tablet, Rfl: 2 .  sildenafil (VIAGRA) 100 MG tablet, TAKE ONE-HALF (1/2) TO ONE TABLET DAILY AS NEEDED FOR ERECTILE DYSFUNCTION, Disp: 30 tablet, Rfl: 3 .  tacrolimus (PROGRAF) 1 MG capsule, Take 3 mg by mouth 2 (two) times daily. , Disp: , Rfl: 11 .  tretinoin (RETIN-A) 0.05 % cream, tretinoin 0.05 % topical cream, Disp: , Rfl:  .  triamcinolone cream (KENALOG) 0.1 %, APPLY DAILY TO INFLAMED BUMPS AS NEEDED, Disp: , Rfl:  .  aspirin EC 81 MG tablet, Take 1 tablet (81 mg  total) by mouth daily. Swallow whole. (Patient not taking: Reported on 01/13/2020), Disp: , Rfl:  .  XYOSTED 100 MG/0.5ML SOAJ, , Disp: , Rfl:    Family History  Problem Relation Age of Onset  . Hypertension Mother   . Hyperlipidemia Mother   . Melanoma Father      Social History   Tobacco Use  . Smoking status: Current Every Day Smoker    Packs/day: 1.00    Years: 38.00    Pack years: 38.00    Types: Cigarettes  . Smokeless tobacco: Never Used  . Tobacco comment: since age 49.  Vaping Use  . Vaping Use: Former  Substance Use Topics  . Alcohol use: Not on file    Comment: Excessive alcohol consumption in the past. Quit around 2016  . Drug use: No    Allergies as of 01/13/2020 - Review Complete 01/13/2020  Allergen Reaction Noted  . No known allergies  05/21/2018    Review of Systems:    All systems reviewed and negative except where noted in HPI.   Physical Exam:  BP (!) 151/79 (BP Location: Left Arm, Patient Position: Sitting, Cuff Size: Normal)   Pulse 87   Temp (!) 97.5 F (36.4 C) (Oral)   Ht 5\' 11"  (1.803 m)   Wt 211 lb 8 oz (95.9 kg)   BMI 29.50 kg/m  No LMP for male patient.  General:   Alert,  Well-developed, well-nourished, pleasant and cooperative in NAD Head:  Normocephalic and atraumatic. Eyes:  Sclera clear, no icterus.   Conjunctiva pink. Ears:  Normal auditory acuity. Nose:  No deformity, discharge, or lesions. Mouth:  No deformity or lesions,oropharynx pink & moist. Neck:  Supple; no masses or thyromegaly. Lungs:  Respirations even and unlabored.  Clear throughout to auscultation.   No wheezes, crackles, or rhonchi. No acute distress. Heart:  Regular rate and rhythm; no murmurs, clicks, rubs, or gallops. Abdomen:  Normal bowel sounds. Soft, obese, non-tender and non-distended without masses, hepatosplenomegaly or hernias noted.  No guarding or rebound tenderness.   Rectal: Not performed Msk:  Symmetrical without gross deformities. Good, equal  movement & strength bilaterally. Pulses:  Normal pulses noted. Extremities:  No clubbing or edema.  No cyanosis. Neurologic:  Alert and oriented x3;  grossly normal neurologically. Skin:  Intact without significant lesions or rashes.  No jaundice. Psych:  Alert and cooperative. Normal mood and affect.  Imaging Studies: Reviewed  Assessment and Plan:   LOT MEDFORD is a 54 y.o. male with history of metabolic syndrome, diabetes on insulin, status post renal transplant on immunosuppression seen in consultation for chronic GERD and dysphagia, currently in partial remission on omeprazole 20 mg twice daily.  Patient underwent EGD in 12/2018, unremarkable  Chronic GERD and dysphagia X-ray upper GI series revealed moderate reflux, no evidence of hernia EGD in 12/2018 unremarkable including esophageal biopsies Increase omeprazole to 40 mg twice daily before meals Continue antireflux lifestyle modification Patient deferred EGD at this time  Dyspepsia Concerned about diabetic gastroparesis given his symptoms Discussed about gastroparesis diet, information provided Continue PPI twice daily Reiterated on tight control of diabetes Encouraged to quit smoking  Elevated alkaline phosphatase Hepatitis B surface antigen, antibody, core antibody and HCV negative Given history of metabolic syndrome, recommend right upper quadrant ultrasound.  This was recommended during last visit, however, patient did not undergo   Follow up in 8 weeks   James Darby, MD

## 2020-01-18 NOTE — Progress Notes (Signed)
Subjective:   James Moreno is a 54 y.o. male who presents for Medicare Annual/Subsequent preventive examination.  I connected with [name] today by telephone and verified that I am speaking with the correct person using two identifiers. Location patient: home Location provider: work Persons participating in the virtual visit: patient, provider.   I discussed the limitations, risks, security and privacy concerns of performing an evaluation and management service by telephone and the availability of in person appointments. I also discussed with the patient that there may be a patient responsible charge related to this service. The patient expressed understanding and verbally consented to this telephonic visit.    Interactive audio and video telecommunications were attempted between this provider and patient, however failed, due to patient having technical difficulties OR patient did not have access to video capability.  We continued and completed visit with audio only.   Review of Systems    N/A  Cardiac Risk Factors include: diabetes mellitus;smoking/ tobacco exposure;hypertension;male gender;dyslipidemia     Objective:    Today's Vitals   01/19/20 1058  PainSc: 3    There is no height or weight on file to calculate BMI.  Advanced Directives 01/19/2020 01/21/2019 01/06/2019 01/02/2018 10/21/2015 02/09/2015 11/17/2014  Does Patient Have a Medical Advance Directive? No No No Yes Yes No Yes  Type of Advance Directive - - Public librarian;Living will - - Living will;Healthcare Power of Attorney  Does patient want to make changes to medical advance directive? - - Yes (ED - Information included in AVS) - - - No - Patient declined  Copy of [place] in Chart? - - - No - copy requested - - No - copy requested  Would patient like information on creating a medical advance directive? No - Patient declined Yes (MAU/Ambulatory/Procedural Areas -  Information given) - - - - -    Current Medications (verified) Outpatient Encounter Medications as of 01/19/2020  Medication Sig  . acetaminophen (TYLENOL) 500 MG tablet Take 500 mg by mouth every 6 (six) hours as needed (headaches.).  Marland Kitchen alprazolam (XANAX) 2 MG tablet Take 1 tablet (2 mg total) by mouth 3 (three) times daily as needed for sleep.  Marland Kitchen amLODipine (NORVASC) 10 MG tablet Take 10 mg by mouth daily.   . Blood Glucose Monitoring Suppl (GLUCOCOM BLOOD GLUCOSE MONITOR) DEVI Frequency:ONCE   Dosage:0.0     Instructions:  Note:Dose: N/A  . carvedilol (COREG) 6.25 MG tablet TAKE 1 TABLET TWICE A DAY WITH MEALS (NEED APPOINTMENT FOR FURTHER REFILLS) (Patient taking differently: Take 12.5 mg by mouth 2 (two) times daily with a meal. )  . cinacalcet (SENSIPAR) 30 MG tablet Take 30 mg by mouth daily.  . Continuous Blood Gluc Receiver (FREESTYLE LIBRE 14 DAY READER) DEVI   . EDEX 40 MCG injection 40 mcg by Intracavitary route as needed.   . fenofibrate (TRICOR) 145 MG tablet Take 1 tablet (145 mg total) by mouth daily. (Patient taking differently: Take 0.5 mg by mouth daily. )  . ibuprofen (ADVIL,MOTRIN) 200 MG tablet Take 200-400 mg by mouth every 8 (eight) hours as needed (for headaches.).  Marland Kitchen insulin aspart (NOVOLOG) 100 UNIT/ML injection Inject 10-16 Units into the skin 3 (three) times daily before meals. Sliding scale (01/14/19 - Pt uses 30 units TID with meals, plus SS of 2 units per 50 over 150)  . Lancets (ONETOUCH DELICA PLUS [id number]) MISC   . losartan (COZAAR) 100 MG tablet Take 1 tablet (100 mg  total) by mouth at bedtime.  . mycophenolate (MYFORTIC) 360 MG TBEC EC tablet Take 720 mg by mouth 2 (two) times daily.   . naloxone (NARCAN) nasal spray 4 mg/0.1 mL Place 1 spray into the nose once.   Marland Kitchen omeprazole (PRILOSEC) 20 MG capsule Take 2 capsules (40 mg total) by mouth 2 (two) times daily before a meal.  . ONETOUCH VERIO test strip   . oxyCODONE (OXY IR/ROXICODONE) 5 MG immediate  release tablet Take 1-2 tablets (5-10 mg total) by mouth every 6 (six) hours as needed for severe pain.  . rosuvastatin (CRESTOR) 40 MG tablet Take 1 tablet (40 mg total) by mouth daily.  . sildenafil (VIAGRA) 100 MG tablet TAKE ONE-HALF (1/2) TO ONE TABLET DAILY AS NEEDED FOR ERECTILE DYSFUNCTION  . tacrolimus (PROGRAF) 1 MG capsule Take 3 mg by mouth 2 (two) times daily.   Marland Kitchen tretinoin (RETIN-A) 0.05 % cream Apply topically as needed.   . triamcinolone cream (KENALOG) 0.1 % APPLY DAILY TO INFLAMED BUMPS AS NEEDED  . XYOSTED 100 MG/0.5ML SOAJ once a week. Every sunday  . aspirin EC 81 MG tablet Take 1 tablet (81 mg total) by mouth daily. Swallow whole. (Patient not taking: Reported on 01/13/2020)  . B-D ULTRAFINE III SHORT PEN 31G X 8 MM MISC  (Patient not taking: Reported on 01/19/2020)  . BD INSULIN SYRINGE U/F 31G X 5/16" 1 ML MISC  (Patient not taking: Reported on 01/19/2020)  . calcitRIOL (ROCALTROL) 0.25 MCG capsule  (Patient not taking: Reported on 01/19/2020)  . Continuous Blood Gluc Sensor (FREESTYLE LIBRE 14 DAY SENSOR) MISC  (Patient not taking: Reported on 01/19/2020)  . Insulin Degludec (TRESIBA FLEXTOUCH [place]) Inject 30 Units into the skin daily with lunch.  (Patient not taking: Reported on 01/19/2020)   No facility-administered encounter medications on file as of 01/19/2020.    Allergies (verified) No known allergies   History: Past Medical History:  Diagnosis Date  . Acute kidney failure, unspecified ([place])   . Anxiety   . Diabetes mellitus, type 2 ([place])   . GERD (gastroesophageal reflux disease)   . History of arterial disease of lower extremity   . History of hepatitis B   . Hypertension   . Hypothyroidism   . Mitral valve disorders(424.0)   . Pityriasis 12/27/2014  . Primary pulmonary HTN ([place])    Pt denies ever having.  . Tricuspid valve disorders, specified as nonrheumatic    Past Surgical History:  Procedure Laterality Date  . APPENDECTOMY    . Carotid  Doppler Ultrasound  06/14/2009   39% stenosis of bilateral internal carotid artery, bilateral anterograde vertebral flow  . ESOPHAGOGASTRODUODENOSCOPY (EGD) WITH PROPOFOL N/A 01/21/2019   Procedure: ESOPHAGOGASTRODUODENOSCOPY (EGD) WITH PROPOFOL;  Surgeon: Lin Landsman, MD;  Location: [facility];  Service: Endoscopy;  Laterality: N/A;  Diabetic - insulin  . EYE SURGERY     right  . HEMORROIDECTOMY    . KIDNEY TRANSPLANT Right 2016  . MECKEL DIVERTICULUM EXCISION     infancy  . Myocardial Perfusion scan  01/17/2009   [facility], non- ischemic. LVEF= 55%  . PARS PLANA VITRECTOMY Left 02/09/2015   Procedure: Pan retinal photocoagulation 29528;  Surgeon: Milus Height, MD;  Location: [facility];  Service: Ophthalmology;  Laterality: Left;  . REFRACTIVE SURGERY Left   . sleep study  01/09/2011   Severe sleep apnea. AHI 72.9/hr. RDI=83.0/hr. Desaturation to 69.0% Emergency CPAP titaration to 14.0cm (01/14/19 resolved after wt loss after kidney transplant.)  Family History  Problem Relation Age of Onset  . Hypertension Mother   . Hyperlipidemia Mother   . Melanoma Father    Social History   Socioeconomic History  . Marital status: Divorced    Spouse name: Not on file  . Number of children: 1  . Years of education: Not on file  . Highest education level: Associate degree: occupational, Hotel manager, or vocational program  Occupational History  . Occupation: Insurance account manager    Comment: on disability  Tobacco Use  . Smoking status: Current Every Day Smoker    Packs/day: 0.75    Years: 38.00    Pack years: 28.50    Types: Cigarettes  . Smokeless tobacco: Never Used  . Tobacco comment: since age 48.  Vaping Use  . Vaping Use: Former  Substance and Sexual Activity  . Alcohol use: Yes    Alcohol/week: 0.0 - 1.0 standard drinks    Comment: Excessive alcohol consumption in the past. Quit around 2016. Drinks occasionally.  . Drug use: No  . Sexual activity: Not on  file  Other Topics Concern  . Not on file  Social History Narrative  . Not on file   Social Determinants of Health   Financial Resource Strain: Medium Risk  . Difficulty of Paying Living Expenses: Somewhat hard  Food Insecurity: No Food Insecurity  . Worried About Charity fundraiser in the Last Year: Never true  . Ran Out of Food in the Last Year: Never true  Transportation Needs: No Transportation Needs  . Lack of Transportation (Medical): No  . Lack of Transportation (Non-Medical): No  Physical Activity: Inactive  . Days of Exercise per Week: 0 days  . Minutes of Exercise per Session: 0 min  Stress: Stress Concern Present  . Feeling of Stress : Rather much  Social Connections: Socially Isolated  . Frequency of Communication with Friends and Family: More than three times a week  . Frequency of Social Gatherings with Friends and Family: More than three times a week  . Attends Religious Services: Never  . Active Member of Clubs or Organizations: No  . Attends Archivist Meetings: Never  . Marital Status: Divorced    Tobacco Counseling Ready to quit: No Counseling given: No Comment: since age 40.   Clinical Intake:  Pre-visit preparation completed: Yes  Pain : 0-10 Pain Score: 3  Pain Type: Chronic pain Pain Location: Abdomen (At site of kidney transplant.) Pain Orientation: Lower Pain Descriptors / Indicators: Dull Pain Frequency: Constant Pain Relieving Factors: Takes Tylenol and Oxycodone as needed for pain.  Pain Relieving Factors: Takes Tylenol and Oxycodone as needed for pain.  Nutritional Risks: None Diabetes: Yes  How often do you need to have someone help you when you read instructions, pamphlets, or other written materials from your doctor or pharmacy?: 1 - Never  Diabetic? Yes  Nutrition Risk Assessment:  Has the patient had any N/V/D within the last 2 months?  No  Does the patient have any non-healing wounds?  No  Has the patient had  any unintentional weight loss or weight gain?  No   Diabetes:  Is the patient diabetic?  Yes  If diabetic, was a CBG obtained today?  No  Did the patient bring in their glucometer from home?  No  How often do you monitor your CBG's? Three times a day.   Financial Strains and Diabetes Management:  Are you having any financial strains with the device, your supplies or your medication?  No .  Does the patient want to be seen by Chronic Care Management for management of their diabetes?  No  Would the patient like to be referred to a Nutritionist or for Diabetic Management?  No   Diabetic Exams:  Diabetic Eye Exam: Completed 01/28/19 Diabetic Foot Exam: Overdue, Pt has been advised about the importance in completing this exam. Note made to follow up on this at next in office apt.    Interpreter Needed?: No  Information entered by :: [facility], LPN   Activities of Daily Living In your present state of health, do you have any difficulty performing the following activities: 01/19/2020 01/21/2019  Hearing? N N  Vision? N N  Difficulty concentrating or making decisions? N N  Walking or climbing stairs? Y N  Comment Due to leg pain from neuropathy. -  Dressing or bathing? N N  Doing errands, shopping? N -  Preparing Food and eating ? N -  Using the Toilet? N -  In the past six months, have you accidently leaked urine? N -  Do you have problems with loss of bowel control? N -  Managing your Medications? N -  Managing your Finances? N -  Housekeeping or managing your Housekeeping? N -  Some recent data might be hidden    Patient Care Team: Birdie Sons, MD as PCP - General (Family Medicine) Rockey Situ, Kathlene November, MD as PCP - Cardiology (Cardiology) Ronnald Collum, Lourdes Sledge, MD as Attending Physician (Endocrinology) Pa, [place] (Optometry) Anthonette Legato, MD (Nephrology) Long, Thomes Cake, MD as Referring Physician (Vascular Surgery) Isaias Sakai, MD as Referring  Physician (Ophthalmology) Lin Landsman, MD as Consulting Physician (Gastroenterology) Wellington Hampshire, MD as Consulting Physician (Cardiology)  Indicate any recent Medical Services you may have received from other than [facility] providers in the past year (date may be approximate).     Assessment:   This is a routine wellness examination for Bren.  Hearing/Vision screen No exam data present  Dietary issues and exercise activities discussed: Current Exercise Habits: The patient does not participate in regular exercise at present, Exercise limited by: neurologic condition(s)  Goals    . Quit Smoking     Recommend to continue efforts to reduce smoking habits until no longer smoking (Smoking Cessation literature attached to AVS).          Depression Screen PHQ 2/9 Scores 01/19/2020 01/06/2019 01/02/2018 12/31/2016  PHQ - 2 Score 0 0 0 0  PHQ- 9 Score - - - 0    Fall Risk Fall Risk  01/19/2020 01/19/2019 01/06/2019 01/02/2018  Falls in the past year? 0 0 0 No  Number falls in past yr: 0 0 0 -  Injury with Fall? 0 0 0 -    Any stairs in or around the home? No  If so, are there any without handrails? No  Home free of loose throw rugs in walkways, pet beds, electrical cords, etc? Yes  Adequate lighting in your home to reduce risk of falls? Yes   ASSISTIVE DEVICES UTILIZED TO PREVENT FALLS:  Life alert? No  Use of a cane, walker or w/c? No  Grab bars in the bathroom? No  Shower chair or bench in shower? Yes  Elevated toilet seat or a handicapped toilet? No    Cognitive Function:     6CIT Screen 01/06/2019  What Year? 0 points  What month? 0 points  What time? 0 points  Count back from 20 0 points  Months in  reverse 0 points  Repeat phrase 0 points  Total Score 0    Immunizations Immunization History  Administered Date(s) Administered  . Influenza Split 12/05/2010  . Influenza,inj,Quad PF,6+ Mos 12/31/2016, 01/27/2019  . PFIZER SARS-COV-2 Vaccination  06/18/2019, 07/09/2019  . Pneumococcal Polysaccharide-23 10/24/2010  . Tdap 12/05/2010    TDAP status: Up to date Flu Vaccine status: Declined, Education has been provided regarding the importance of this vaccine but patient still declined. Advised may receive this vaccine at local pharmacy or Health Dept. Aware to provide a copy of the vaccination record if obtained from local pharmacy or Health Dept. Verbalized acceptance and understanding. Covid-19 vaccine status: Completed vaccines  Qualifies for Shingles Vaccine? Yes   Zostavax completed No   Shingrix Completed?: No.    Education has been provided regarding the importance of this vaccine. Patient has been advised to call insurance company to determine out of pocket expense if they have not yet received this vaccine. Advised may also receive vaccine at local pharmacy or Health Dept. Verbalized acceptance and understanding.  Screening Tests Health Maintenance  Topic Date Due  . HIV Screening  Never done  . FOOT EXAM  11/15/2018  . INFLUENZA VACCINE  10/25/2019  . OPHTHALMOLOGY EXAM  01/28/2020  . HEMOGLOBIN A1C  03/18/2020  . Fecal DNA (Cologuard)  08/06/2020  . TETANUS/TDAP  12/04/2020  . PNEUMOCOCCAL POLYSACCHARIDE VACCINE AGE 51-64 HIGH RISK  Completed  . COVID-19 Vaccine  Completed    Health Maintenance  Health Maintenance Due  Topic Date Due  . HIV Screening  Never done  . FOOT EXAM  11/15/2018  . INFLUENZA VACCINE  10/25/2019    Colorectal cancer screening: Cologuard completed 08/06/17. Repeat every 3 years  Lung Cancer Screening: (Low Dose CT Chest recommended if Age 3-80 years, 30 pack-year currently smoking OR have quit w/in 15years.) does not qualify.   Additional Screening:  Vision Screening: Recommended annual ophthalmology exams for early detection of glaucoma and other disorders of the eye. Is the patient up to date with their annual eye exam?  Yes  Who is the provider or what is the name of the office in  which the patient attends annual eye exams? Dr Roosevelt Locks @ [place]. If pt is not established with a provider, would they like to be referred to a provider to establish care? No .   Dental Screening: Recommended annual dental exams for proper oral hygiene  Community Resource Referral / Chronic Care Management: CRR required this visit?  No   CCM required this visit? Yes, for medication concerns with coverage.     Plan:     I have personally reviewed and noted the following in the patient's chart:   . Medical and social history . Use of alcohol, tobacco or illicit drugs  . Current medications and supplements . Functional ability and status . Nutritional status . Physical activity . Advanced directives . List of other physicians . Hospitalizations, surgeries, and ER visits in previous 12 months . Vitals . Screenings to include cognitive, depression, and falls . Referrals and appointments  In addition, I have reviewed and discussed with patient certain preventive protocols, quality metrics, and best practice recommendations. A written personalized care plan for preventive services as well as general preventive health recommendations were provided to patient.     Tigerlily Christine [place], [place]   [id number]   Nurse Notes: Pt needs a diabetic foot exam and would like his flu shot at next in office apt.

## 2020-01-19 ENCOUNTER — Telehealth: Payer: Self-pay | Admitting: *Deleted

## 2020-01-19 ENCOUNTER — Other Ambulatory Visit: Payer: Self-pay

## 2020-01-19 ENCOUNTER — Ambulatory Visit (INDEPENDENT_AMBULATORY_CARE_PROVIDER_SITE_OTHER): Payer: Medicare HMO

## 2020-01-19 DIAGNOSIS — Z599 Problem related to housing and economic circumstances, unspecified: Secondary | ICD-10-CM

## 2020-01-19 DIAGNOSIS — Z0000 Encounter for general adult medical examination without abnormal findings: Secondary | ICD-10-CM | POA: Diagnosis not present

## 2020-01-19 DIAGNOSIS — E1121 Type 2 diabetes mellitus with diabetic nephropathy: Secondary | ICD-10-CM | POA: Diagnosis not present

## 2020-01-19 NOTE — Patient Instructions (Signed)
James Moreno , Thank you for taking time to come for your Medicare Wellness Visit. I appreciate your ongoing commitment to your health goals. Please review the following plan we discussed and let me know if I can assist you in the future.   Screening recommendations/referrals: Colonoscopy: Cologuard up to date, due 08/06/20 Recommended yearly ophthalmology/optometry visit for glaucoma screening and checkup Recommended yearly dental visit for hygiene and checkup  Vaccinations: Influenza vaccine: Currently due. Pt to receive at next in office apt.  Tdap vaccine: Up to date, due 11/2020 Shingles vaccine: Shingrix discussed. Please contact your pharmacy for coverage information.     Advanced directives: Advance directive discussed with you today. Even though you declined this today please call our office should you change your mind and we can give you the proper paperwork for you to fill out.  Conditions/risks identified: Smoking cessation discussed today.   Next appointment: 02/12/20 @ 9:40 AM with Dr Caryn Section   Preventive Care 55 Years and Older, Male Preventive care refers to lifestyle choices and visits with your health care provider that can promote health and wellness. What does preventive care include?  A yearly physical exam. This is also called an annual well check.  Dental exams once or twice a year.  Routine eye exams. Ask your health care provider how often you should have your eyes checked.  Personal lifestyle choices, including:  Daily care of your teeth and gums.  Regular physical activity.  Eating a healthy diet.  Avoiding tobacco and drug use.  Limiting alcohol use.  Practicing safe sex.  Taking low doses of aspirin every day.  Taking vitamin and mineral supplements as recommended by your health care provider. What happens during an annual well check? The services and screenings done by your health care provider during your annual well check will depend on your  age, overall health, lifestyle risk factors, and family history of disease. Counseling  Your health care provider may ask you questions about your:  Alcohol use.  Tobacco use.  Drug use.  Emotional well-being.  Home and relationship well-being.  Sexual activity.  Eating habits.  History of falls.  Memory and ability to understand (cognition).  Work and work Statistician. Screening  You may have the following tests or measurements:  Height, weight, and BMI.  Blood pressure.  Lipid and cholesterol levels. These may be checked every 5 years, or more frequently if you are over 15 years old.  Skin check.  Lung cancer screening. You may have this screening every year starting at age 36 if you have a 30-pack-year history of smoking and currently smoke or have quit within the past 15 years.  Fecal occult blood test (FOBT) of the stool. You may have this test every year starting at age 47.  Flexible sigmoidoscopy or colonoscopy. You may have a sigmoidoscopy every 5 years or a colonoscopy every 10 years starting at age 49.  Prostate cancer screening. Recommendations will vary depending on your family history and other risks.  Hepatitis C blood test.  Hepatitis B blood test.  Sexually transmitted disease (STD) testing.  Diabetes screening. This is done by checking your blood sugar (glucose) after you have not eaten for a while (fasting). You may have this done every 1-3 years.  Abdominal aortic aneurysm (AAA) screening. You may need this if you are a current or former smoker.  Osteoporosis. You may be screened starting at age 57 if you are at high risk. Talk with your health care provider about  your test results, treatment options, and if necessary, the need for more tests. Vaccines  Your health care provider may recommend certain vaccines, such as:  Influenza vaccine. This is recommended every year.  Tetanus, diphtheria, and acellular pertussis (Tdap, Td) vaccine. You  may need a Td booster every 10 years.  Zoster vaccine. You may need this after age 59.  Pneumococcal 13-valent conjugate (PCV13) vaccine. One dose is recommended after age 66.  Pneumococcal polysaccharide (PPSV23) vaccine. One dose is recommended after age 32. Talk to your health care provider about which screenings and vaccines you need and how often you need them. This information is not intended to replace advice given to you by your health care provider. Make sure you discuss any questions you have with your health care provider. Document Released: 04/08/2015 Document Revised: 11/30/2015 Document Reviewed: 01/11/2015 Elsevier Interactive Patient Education  2017 [employer] Prevention in the Home Falls can cause injuries. They can happen to people of all ages. There are many things you can do to make your home safe and to help prevent falls. What can I do on the outside of my home?  Regularly fix the edges of walkways and driveways and fix any cracks.  Remove anything that might make you trip as you walk through a door, such as a raised step or threshold.  Trim any bushes or trees on the path to your home.  Use bright outdoor lighting.  Clear any walking paths of anything that might make someone trip, such as rocks or tools.  Regularly check to see if handrails are loose or broken. Make sure that both sides of any steps have handrails.  Any raised decks and porches should have guardrails on the edges.  Have any leaves, snow, or ice cleared regularly.  Use sand or salt on walking paths during winter.  Clean up any spills in your garage right away. This includes oil or grease spills. What can I do in the bathroom?  Use night lights.  Install grab bars by the toilet and in the tub and shower. Do not use towel bars as grab bars.  Use non-skid mats or decals in the tub or shower.  If you need to sit down in the shower, use a plastic, non-slip stool.  Keep the floor  dry. Clean up any water that spills on the floor as soon as it happens.  Remove soap buildup in the tub or shower regularly.  Attach bath mats securely with double-sided non-slip rug tape.  Do not have throw rugs and other things on the floor that can make you trip. What can I do in the bedroom?  Use night lights.  Make sure that you have a light by your bed that is easy to reach.  Do not use any sheets or blankets that are too big for your bed. They should not hang down onto the floor.  Have a firm chair that has side arms. You can use this for support while you get dressed.  Do not have throw rugs and other things on the floor that can make you trip. What can I do in the kitchen?  Clean up any spills right away.  Avoid walking on wet floors.  Keep items that you use a lot in easy-to-reach places.  If you need to reach something above you, use a strong step stool that has a grab bar.  Keep electrical cords out of the way.  Do not use floor polish or wax  that makes floors slippery. If you must use wax, use non-skid floor wax.  Do not have throw rugs and other things on the floor that can make you trip. What can I do with my stairs?  Do not leave any items on the stairs.  Make sure that there are handrails on both sides of the stairs and use them. Fix handrails that are broken or loose. Make sure that handrails are as long as the stairways.  Check any carpeting to make sure that it is firmly attached to the stairs. Fix any carpet that is loose or worn.  Avoid having throw rugs at the top or bottom of the stairs. If you do have throw rugs, attach them to the floor with carpet tape.  Make sure that you have a light switch at the top of the stairs and the bottom of the stairs. If you do not have them, ask someone to add them for you. What else can I do to help prevent falls?  Wear shoes that:  Do not have high heels.  Have rubber bottoms.  Are comfortable and fit you  well.  Are closed at the toe. Do not wear sandals.  If you use a stepladder:  Make sure that it is fully opened. Do not climb a closed stepladder.  Make sure that both sides of the stepladder are locked into place.  Ask someone to hold it for you, if possible.  Clearly mark and make sure that you can see:  Any grab bars or handrails.  First and last steps.  Where the edge of each step is.  Use tools that help you move around (mobility aids) if they are needed. These include:  Canes.  Walkers.  Scooters.  Crutches.  Turn on the lights when you go into a dark area. Replace any light bulbs as soon as they burn out.  Set up your furniture so you have a clear path. Avoid moving your furniture around.  If any of your floors are uneven, fix them.  If there are any pets around you, be aware of where they are.  Review your medicines with your doctor. Some medicines can make you feel dizzy. This can increase your chance of falling. Ask your doctor what other things that you can do to help prevent falls. This information is not intended to replace advice given to you by your health care provider. Make sure you discuss any questions you have with your health care provider. Document Released: 01/06/2009 Document Revised: 08/18/2015 Document Reviewed: 04/16/2014 Elsevier Interactive Patient Education  2017 [employer].

## 2020-01-19 NOTE — Chronic Care Management (AMB) (Signed)
  Chronic Care Management   Outreach Note  01/19/2020 Name: James Moreno MRN: [id number] DOB: June 22, 1965  James Moreno is a 54 y.o. year old male who is a primary care patient of Caryn Section, Kirstie Peri, MD. I reached out to [name] by phone today in response to a referral sent by [name]'s PCP, Birdie Sons, MD.     An unsuccessful telephone outreach was attempted today. The patient was referred to the case management team for assistance with care management and care coordination.   Follow Up Plan: A HIPAA compliant phone message was left for the patient providing contact information and requesting a return call. The care management team will reach out to the patient again. If patient returns call to provider office, please advise to call [place] [facility] at [phone].  [facility]

## 2020-01-25 ENCOUNTER — Other Ambulatory Visit: Payer: Self-pay | Admitting: Family Medicine

## 2020-01-25 DIAGNOSIS — L905 Scar conditions and fibrosis of skin: Secondary | ICD-10-CM

## 2020-01-25 DIAGNOSIS — M79604 Pain in right leg: Secondary | ICD-10-CM

## 2020-01-25 DIAGNOSIS — R52 Pain, unspecified: Secondary | ICD-10-CM

## 2020-01-25 NOTE — Telephone Encounter (Signed)
Pt has called pharm and was told to have md. Pt needs a refill on oxycodone 5 mg. [facility] [address] in [place]

## 2020-01-25 NOTE — Telephone Encounter (Signed)
Requested medication (s) are due for refill today:Due 01/28/20  Requested medication (s) are on the active medication list?  Yes  Last refill: 12/28/19  #240  0 refills  Future visit scheduled Yes 02/12/20  Notes to clinic:not delegated  Requested Prescriptions  Pending Prescriptions Disp Refills   oxyCODONE (OXY IR/ROXICODONE) 5 MG immediate release tablet 240 tablet 0    Sig: Take 1-2 tablets (5-10 mg total) by mouth every 6 (six) hours as needed for severe pain.      Not Delegated - Analgesics:  Opioid Agonists Failed - 01/25/2020 12:59 PM      Failed - This refill cannot be delegated      Failed - Urine Drug Screen completed in last 360 days      Passed - Valid encounter within last 6 months    Recent Outpatient Visits           5 months ago Type 2 diabetes mellitus with diabetic nephropathy, without long-term current use of insulin [facility])   [facility] Birdie Sons, MD   1 year ago Mixed hyperlipidemia   [facility] Birdie Sons, MD   2 years ago Hypertension secondary to other renal disorders   [facility] Birdie Sons, MD   2 years ago [place], Donald E, MD   3 years ago Chronic, continuous use of opioids   [facility] Birdie Sons, MD       Future Appointments             Tomorrow Wellington Hampshire, MD [facility], [facility]   In 2 weeks Fisher, Kirstie Peri, MD [facility], [place]   In 4 weeks Gollan, Kathlene November, MD [facility], [facility]   In 2 months Vanga, Tally Due, MD [place]

## 2020-01-26 ENCOUNTER — Other Ambulatory Visit: Payer: Self-pay

## 2020-01-26 ENCOUNTER — Encounter: Payer: Self-pay | Admitting: Cardiovascular Disease

## 2020-01-26 ENCOUNTER — Ambulatory Visit: Payer: Medicare HMO | Admitting: Cardiovascular Disease

## 2020-01-26 VITALS — BP 140/80 | HR 95 | Ht 71.0 in | Wt 213.0 lb

## 2020-01-26 DIAGNOSIS — L97909 Non-pressure chronic ulcer of unspecified part of unspecified lower leg with unspecified severity: Secondary | ICD-10-CM | POA: Diagnosis not present

## 2020-01-26 DIAGNOSIS — I10 Essential (primary) hypertension: Secondary | ICD-10-CM

## 2020-01-26 DIAGNOSIS — I2510 Atherosclerotic heart disease of native coronary artery without angina pectoris: Secondary | ICD-10-CM

## 2020-01-26 DIAGNOSIS — Z720 Tobacco use: Secondary | ICD-10-CM

## 2020-01-26 DIAGNOSIS — I70299 Other atherosclerosis of native arteries of extremities, unspecified extremity: Secondary | ICD-10-CM

## 2020-01-26 DIAGNOSIS — I739 Peripheral vascular disease, unspecified: Secondary | ICD-10-CM

## 2020-01-26 NOTE — [place] Notes (Signed)
Cardiology Office Note   Date:  01/26/2020   ID:  James Moreno, DOB [birth year], MRN [id number]  PCP:  Birdie Sons, MD  Cardiologist: Dr. Rockey Situ  Chief Complaint  Patient presents with   OTHER    PAD. Meds reviewed verbally with pt.      History of Present Illness: James Moreno is a 54 y.o. male who was referred by Ignacia Bayley for evaluation and management of peripheral arterial disease. He has known history of coronary artery disease, essential hypertension, hyperlipidemia, diabetes mellitus and tobacco use.  He is status post kidney transplant in April 2016.  His GFR is around 35.  He recently complained of bilateral calf claudication .  Previous ABI in 2019 was normal bilaterally. However, he underwent recent Doppler studies which showed an ABI of 0.78 on the right and 0.66 on the left.  Duplex showed possible inflow disease bilaterally with monophasic waveform in the common femoral artery.  The left common iliac artery was occluded into the external iliac artery.  He reports bilateral calf claudication left worse than the right which happens after walking about 200 feet.  No rest pain no lower extremity ulceration.  He smokes 1 pack/day.  No chest pain or shortness of breath.  Past Medical History:  Diagnosis Date   Acute kidney failure, unspecified ([place])    Anxiety    Diabetes mellitus, type 2 (HCC)    GERD (gastroesophageal reflux disease)    History of arterial disease of lower extremity    History of hepatitis B    Hypertension    Hypothyroidism    Mitral valve disorders(424.0)    Pityriasis 12/27/2014   Primary pulmonary HTN ([place])    Pt denies ever having.   Tricuspid valve disorders, specified as nonrheumatic     Past Surgical History:  Procedure Laterality Date   APPENDECTOMY     Carotid Doppler Ultrasound  06/14/2009   39% stenosis of bilateral internal carotid artery, bilateral anterograde vertebral flow    ESOPHAGOGASTRODUODENOSCOPY (EGD) WITH PROPOFOL N/A 01/21/2019   Procedure: ESOPHAGOGASTRODUODENOSCOPY (EGD) WITH PROPOFOL;  Surgeon: Lin Landsman, MD;  Location: [place] [facility];  Service: Endoscopy;  Laterality: N/A;  Diabetic - insulin   EYE SURGERY     right   HEMORROIDECTOMY     KIDNEY TRANSPLANT Right 2016   MECKEL DIVERTICULUM EXCISION     infancy   Myocardial Perfusion scan  01/17/2009   [facility] non- ischemic. LVEF= 55%   PARS PLANA VITRECTOMY Left 02/09/2015   Procedure: Pan retinal photocoagulation 54656;  Surgeon: Milus Height, MD;  Location: [facility];  Service: Ophthalmology;  Laterality: Left;   REFRACTIVE SURGERY Left    sleep study  01/09/2011   Severe sleep apnea. AHI 72.9/hr. RDI=83.0/hr. Desaturation to 69.0% Emergency CPAP titaration to 14.0cm (01/14/19 resolved after wt loss after kidney transplant.)     Current Outpatient Medications  Medication Sig Dispense Refill   acetaminophen (TYLENOL) 500 MG tablet Take 500 mg by mouth every 6 (six) hours as needed (headaches.).     alprazolam (XANAX) 2 MG tablet Take 1 tablet (2 mg total) by mouth 3 (three) times daily as needed for sleep. 90 tablet 2   amLODipine (NORVASC) 10 MG tablet Take 10 mg by mouth daily.      aspirin EC 81 MG tablet Take 1 tablet (81 mg total) by mouth daily. Swallow whole.     B-D ULTRAFINE III SHORT PEN 31G X 8 MM MISC  BD INSULIN SYRINGE U/F 31G X 5/16" 1 ML MISC      Blood Glucose Monitoring Suppl (GLUCOCOM BLOOD GLUCOSE MONITOR) DEVI Frequency:ONCE   Dosage:0.0     Instructions:  Note:Dose: N/A     calcitRIOL (ROCALTROL) 0.25 MCG capsule      carvedilol (COREG) 6.25 MG tablet TAKE 1 TABLET TWICE A DAY WITH MEALS (NEED APPOINTMENT FOR FURTHER REFILLS) (Patient taking differently: Take 12.5 mg by mouth 2 (two) times daily with a meal. ) 180 tablet 1   cinacalcet (SENSIPAR) 30 MG tablet Take 30 mg by mouth daily.     Continuous Blood Gluc Receiver  (FREESTYLE LIBRE 14 DAY READER) DEVI      Continuous Blood Gluc Sensor (FREESTYLE LIBRE 14 DAY SENSOR) MISC      EDEX 40 MCG injection 40 mcg by Intracavitary route as needed.      fenofibrate (TRICOR) 145 MG tablet Take 1 tablet (145 mg total) by mouth daily. (Patient taking differently: Take 0.5 mg by mouth daily. ) 30 tablet 0   ibuprofen (ADVIL,MOTRIN) 200 MG tablet Take 200-400 mg by mouth every 8 (eight) hours as needed (for headaches.).     insulin aspart (NOVOLOG) 100 UNIT/ML injection Inject 10-16 Units into the skin 3 (three) times daily before meals. Sliding scale (01/14/19 - Pt uses 30 units TID with meals, plus SS of 2 units per 50 over 150)     Lancets (ONETOUCH DELICA PLUS [id number]) MISC      losartan (COZAAR) 100 MG tablet Take 1 tablet (100 mg total) by mouth at bedtime. 90 tablet 2   mycophenolate (MYFORTIC) 360 MG TBEC EC tablet Take 720 mg by mouth 2 (two) times daily.      naloxone (NARCAN) nasal spray 4 mg/0.1 mL Place 1 spray into the nose once.      omeprazole (PRILOSEC) 20 MG capsule Take 2 capsules (40 mg total) by mouth 2 (two) times daily before a meal. 360 capsule 4   ONETOUCH VERIO test strip      oxyCODONE (OXY IR/ROXICODONE) 5 MG immediate release tablet Take 1-2 tablets (5-10 mg total) by mouth every 6 (six) hours as needed for severe pain. 240 tablet 0   rosuvastatin (CRESTOR) 40 MG tablet Take 1 tablet (40 mg total) by mouth daily. 90 tablet 2   sildenafil (VIAGRA) 100 MG tablet TAKE ONE-HALF (1/2) TO ONE TABLET DAILY AS NEEDED FOR ERECTILE DYSFUNCTION 30 tablet 3   tacrolimus (PROGRAF) 1 MG capsule Take 3 mg by mouth 2 (two) times daily.   11   tretinoin (RETIN-A) 0.05 % cream Apply topically as needed.      triamcinolone cream (KENALOG) 0.1 % APPLY DAILY TO INFLAMED BUMPS AS NEEDED     XYOSTED 100 MG/0.5ML SOAJ once a week. Every sunday     Insulin Degludec (TRESIBA FLEXTOUCH [place]) Inject 30 Units into the skin daily with lunch.  (Patient not  taking: Reported on 01/19/2020)     No current facility-administered medications for this visit.    Allergies:   No known allergies    Social History:  The patient  reports that he has been smoking cigarettes. He has a 28.50 pack-year smoking history. He has never used smokeless tobacco. He reports current alcohol use. He reports that he does not use drugs.   Family History:  The patient's family history includes Hyperlipidemia in his mother; Hypertension in his mother; Melanoma in his father.    ROS:  Please see the history of present illness.  Otherwise, review of systems are positive for none.   All other systems are reviewed and negative.    PHYSICAL EXAM: VS:  BP 140/80 (BP Location: Left Arm, Patient Position: Sitting, Cuff Size: Normal)    Pulse 95    Ht 5\' 11"  (1.803 m)    Wt 213 lb (96.6 kg)    SpO2 98%    BMI 29.71 kg/m  , BMI Body mass index is 29.71 kg/m. GEN: Well nourished, well developed, in no acute distress  HEENT: normal  Neck: no JVD, carotid bruits, or masses Cardiac: RRR; no murmurs, rubs, or gallops,no edema  Respiratory:  clear to auscultation bilaterally, normal work of breathing GI: soft, nontender, nondistended, + BS MS: no deformity or atrophy  Skin: warm and dry, no rash Neuro:  Strength and sensation are intact Psych: euthymic mood, full affect Vascular: Femoral pulse is +1 on the right and absent on the left.  Distal pulses are not palpable.   EKG:  EKG is ordered today. The ekg ordered today demonstrates normal sinus rhythm with no significant ST or T wave changes.   Recent Labs: 09/17/2019: ALT 9; BUN 25; Creatinine, Ser 1.92; Potassium 4.5; Sodium 132    Lipid Panel    Component Value Date/Time   CHOL 103 12/12/2018 0000   CHOL 166 01/10/2018 0821   TRIG 95 12/12/2018 0000   HDL 40 12/12/2018 0000   HDL 30 (L) 01/10/2018 0821   CHOLHDL 5.5 (H) 01/10/2018 0821   LDLCALC 45 12/12/2018 0000   LDLCALC 77 01/10/2018 0821      Wt  Readings from Last 3 Encounters:  01/26/20 213 lb (96.6 kg)  01/13/20 211 lb 8 oz (95.9 kg)  12/21/19 214 lb 2 oz (97.1 kg)        No flowsheet data found.    ASSESSMENT AND PLAN:  1.  Peripheral arterial disease: The patient has severe bilateral calf claudication worse on the left side.  His iliac arteries are occluded on the left side.  He likely has significant iliac disease on the right side but does not seem to be occlusive as he has a palpable right common femoral artery pulse.  His kidney was transplanted on the right side.  I discussed with him the natural history and management of claudication.  Fortunately, he has no evidence of rest pain or critical limb ischemia. I recommend treating his risk factors and starting a regular walking program which was discussed with him today.  He is going to start.  In addition, I explained to him that it is critical that he quit smoking and he is determined to do so. Regarding revascularization options, I think we are somewhat limited by previous kidney transplant and chronic kidney disease with GFR around 35.  I think any contrast use will be associated with significant risk of contrast-induced nephropathy.  The patient prefers not to go through any procedure that requires contrast unless absolutely necessary which I agree with.  His PAD situation is certainly not limb threatening at this point.  2.  Coronary artery disease involving native coronary arteries without angina: Continue medical therapy.  3.  Tobacco use: I discussed with him the importance of smoking cessation and he is determined to quit.  4.  Hyperlipidemia: Currently on rosuvastatin and fenofibrate.  Most recent lipid profile showed an LDL of 45.    Disposition:   FU with me in 3 months  Signed,  Kathlyn Sacramento, MD  01/26/2020 4:20 PM  [facility]

## 2020-01-26 NOTE — Patient Instructions (Signed)
Medication Instructions:  Your physician recommends that you continue on your current medications as directed. Please refer to the Current Medication list given to you today.  *If you need a refill on your cardiac medications before your next appointment, please call your pharmacy*   Lab Work: None ordered.  If you have labs (blood work) drawn today and your tests are completely normal, you will receive your results only by: Marland Kitchen MyChart Message (if you have MyChart) OR . A paper copy in the mail If you have any lab test that is abnormal or we need to change your treatment, we will call you to review the results.   Testing/Procedures: None ordered.    Follow-Up: At [facility], you and your health needs are our priority.  As part of our continuing mission to provide you with exceptional heart care, we have created designated Provider Care Teams.  These Care Teams include your primary Cardiologist (physician) and Advanced Practice Providers (APPs -  Physician Assistants and Nurse Practitioners) who all work together to provide you with the care you need, when you need it.  We recommend signing up for the patient portal called "MyChart".  Sign up information is provided on this After Visit Summary.  MyChart is used to connect with patients for Virtual Visits (Telemedicine).  Patients are able to view lab/test results, encounter notes, upcoming appointments, etc.  Non-urgent messages can be sent to your provider as well.   To learn more about what you can do with MyChart, go to [email or web address].    Your next appointment:   3 month(s)  The format for your next appointment:   In Person  Provider:   You may see Dr. Fletcher Anon or one of the following Advanced Practice Providers on your designated Care Team:    Murray Hodgkins, NP  Christell Faith, PA-C  Marrianne Mood, PA-C  Cadence Kathlen Mody, [place]    Other Instructions  Coping with Quitting Smoking  Quitting smoking is a  physical and mental challenge. You will face cravings, withdrawal symptoms, and temptation. Before quitting, work with your health care provider to make a plan that can help you cope. Preparation can help you quit and keep you from giving in. How can I cope with cravings? Cravings usually last for 5-10 minutes. If you get through it, the craving will pass. Consider taking the following actions to help you cope with cravings:  Keep your mouth busy: ? Chew sugar-free gum. ? Suck on hard candies or a straw. ? Brush your teeth.  Keep your hands and body busy: ? Immediately change to a different activity when you feel a craving. ? Squeeze or play with a ball. ? Do an activity or a hobby, like making bead jewelry, practicing needlepoint, or working with wood. ? Mix up your normal routine. ? Take a short exercise break. Go for a quick walk or run up and down stairs. ? Spend time in public places where smoking is not allowed.  Focus on doing something kind or helpful for someone else.  Call a friend or family member to talk during a craving.  Join a support group.  Call a quit line, such as 1-800-QUIT-NOW.  Talk with your health care provider about medicines that might help you cope with cravings and make quitting easier for you. How can I deal with withdrawal symptoms? Your body may experience negative effects as it tries to get used to not having nicotine in the system. These effects are called withdrawal symptoms. They  may include:  Feeling hungrier than normal.  Trouble concentrating.  Irritability.  Trouble sleeping.  Feeling depressed.  Restlessness and agitation.  Craving a cigarette. To manage withdrawal symptoms:  Avoid places, people, and activities that trigger your cravings.  Remember why you want to quit.  Get plenty of sleep.  Avoid coffee and other caffeinated drinks. These may worsen some of your symptoms. How can I handle social situations? Social  situations can be difficult when you are quitting smoking, especially in the first few weeks. To manage this, you can:  Avoid parties, bars, and other social situations where people might be smoking.  Avoid alcohol.  Leave right away if you have the urge to smoke.  Explain to your family and friends that you are quitting smoking. Ask for understanding and support.  Plan activities with friends or family where smoking is not an option. What are some ways I can cope with stress? Wanting to smoke may cause stress, and stress can make you want to smoke. Find ways to manage your stress. Relaxation techniques can help. For example:  Breathe slowly and deeply, in through your nose and out through your mouth.  Listen to soothing, relaxing music.  Talk with a family member or friend about your stress.  Light a candle.  Soak in a bath or take a shower.  Think about a peaceful place. What are some ways I can prevent weight gain? Be aware that many people gain weight after they quit smoking. However, not everyone does. To keep from gaining weight, have a plan in place before you quit and stick to the plan after you quit. Your plan should include:  Having healthy snacks. When you have a craving, it may help to: ? Eat plain popcorn, crunchy carrots, celery, or other cut vegetables. ? Chew sugar-free gum.  Changing how you eat: ? Eat small portion sizes at meals. ? Eat 4-6 small meals throughout the day instead of 1-2 large meals a day. ? Be mindful when you eat. Do not watch television or do other things that might distract you as you eat.  Exercising regularly: ? Make time to exercise each day. If you do not have time for a long workout, do short bouts of exercise for 5-10 minutes several times a day. ? Do some form of strengthening exercise, like weight lifting, and some form of aerobic exercise, like running or swimming.  Drinking plenty of water or other low-calorie or no-calorie  drinks. Drink 6-8 glasses of water daily, or as much as instructed by your health care provider. Summary  Quitting smoking is a physical and mental challenge. You will face cravings, withdrawal symptoms, and temptation to smoke again. Preparation can help you as you go through these challenges.  You can cope with cravings by keeping your mouth busy (such as by chewing gum), keeping your body and hands busy, and making calls to family, friends, or a helpline for people who want to quit smoking.  You can cope with withdrawal symptoms by avoiding places where people smoke, avoiding drinks with caffeine, and getting plenty of rest.  Ask your health care provider about the different ways to prevent weight gain, avoid stress, and handle social situations. This information is not intended to replace advice given to you by your health care provider. Make sure you discuss any questions you have with your health care provider. Document Revised: 02/22/2017 Document Reviewed: 03/09/2016 Elsevier Patient Education  2020 [employer].

## 2020-01-27 MED ORDER — OXYCODONE HCL 5 MG PO TABS: 5.0000 mg | ORAL_TABLET | Freq: Four times a day (QID) | ORAL | 0 refills | Status: DC | PRN

## 2020-01-29 ENCOUNTER — Other Ambulatory Visit: Payer: Self-pay

## 2020-01-29 MED ORDER — ROSUVASTATIN CALCIUM 40 MG PO TABS: 40.0000 mg | ORAL_TABLET | Freq: Every day | ORAL | 0 refills | Status: DC

## 2020-01-29 MED ORDER — ROSUVASTATIN CALCIUM 40 MG PO TABS: 40.0000 mg | ORAL_TABLET | Freq: Every day | ORAL | 3 refills | Status: DC

## 2020-02-03 IMAGING — CT CT HEAD W/O CM
1 series · 16 of 30 positions shown, 20 images · non-contrast
Comparison: June 21, 2013.

CLINICAL DATA: Right eye pain, double vision.

EXAM:
CT HEAD AND ORBITS WITHOUT CONTRAST
TECHNIQUE: Contiguous axial images were obtained from the base of the skull
through the vertex without contrast. Multidetector CT imaging of the
orbits was performed using the standard protocol without intravenous
contrast.

[Series 2: head wo · axial · 0.43mm/px · z∈[-138,+12]mm · 16 of 34 slices shown, 20 images]
[im 2/34  brain]
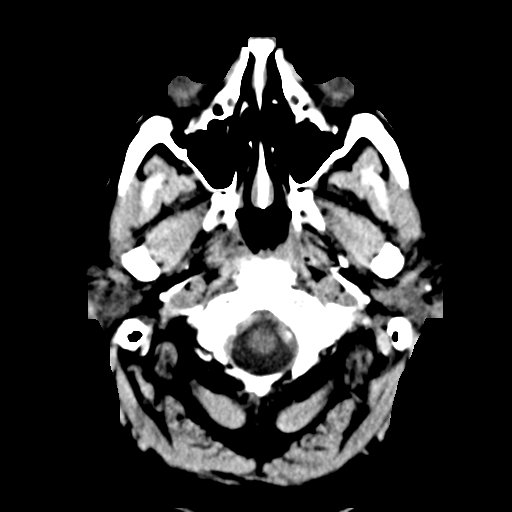
[im 2/34  bone]
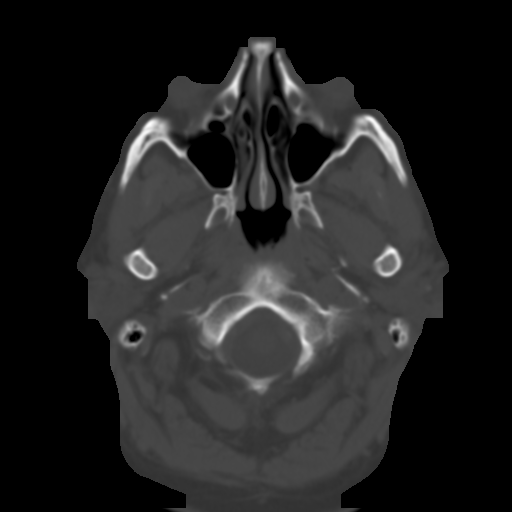
[im 4/34  brain]
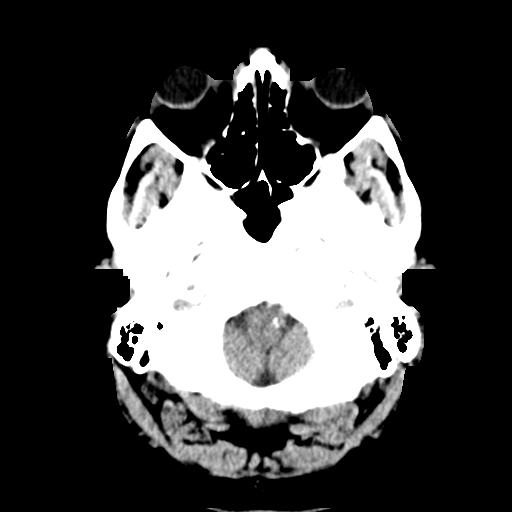
[im 6/34  brain]
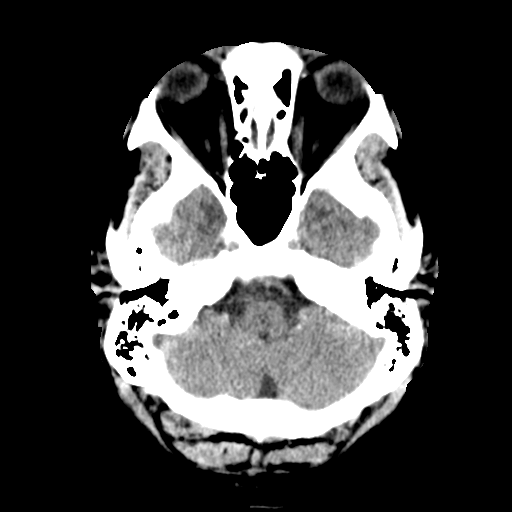
[im 8/34  brain]
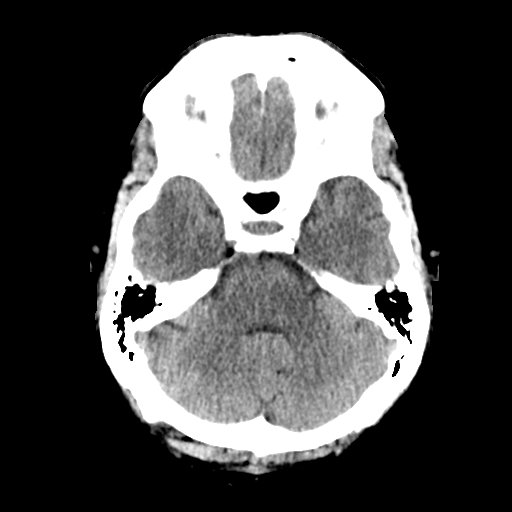
[im 10/34  brain]
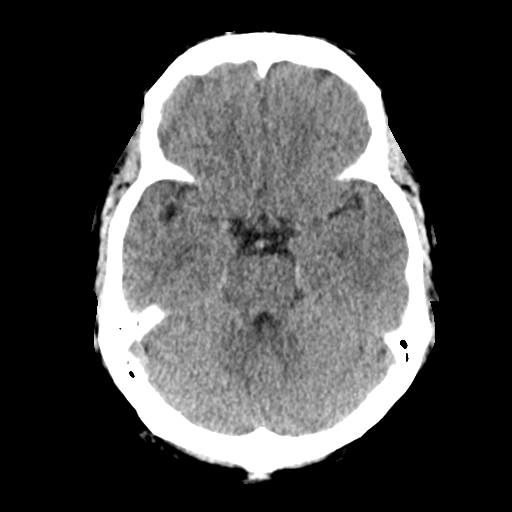
[im 10/34  bone]
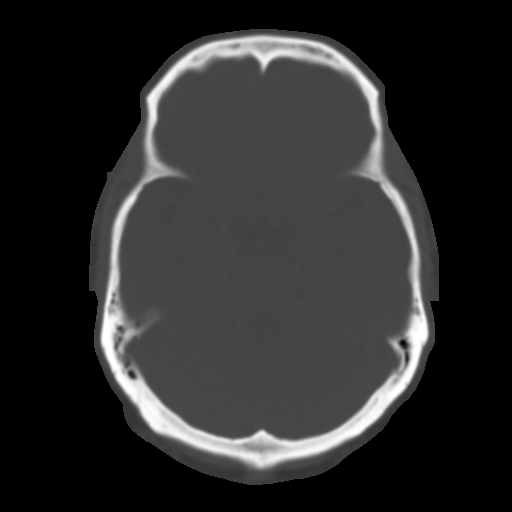
[im 12/34  brain]
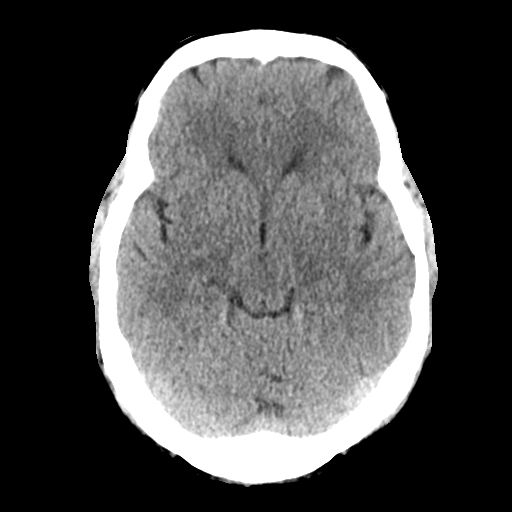
[im 14/34  brain]
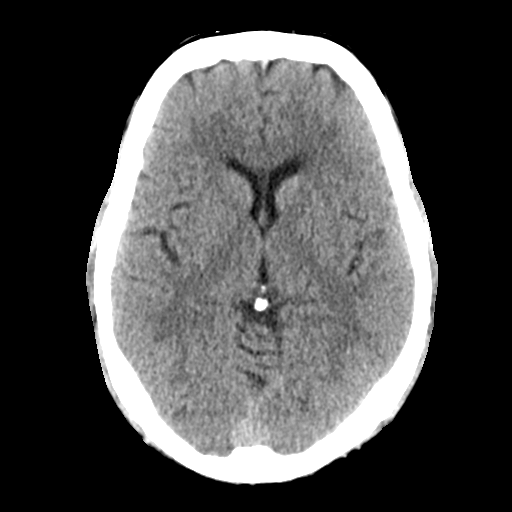
[im 16/34  brain]
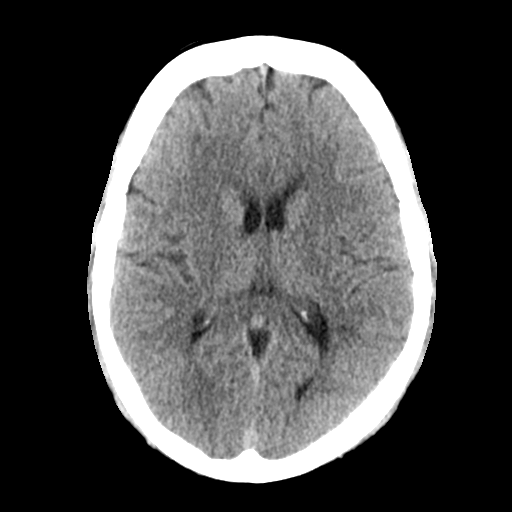
[im 18/34  brain]
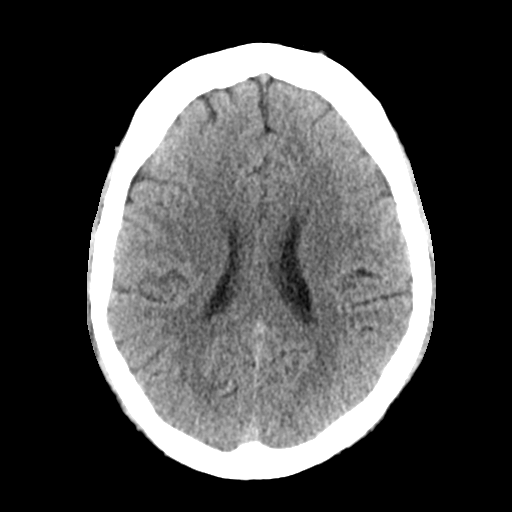
[im 18/34  bone]
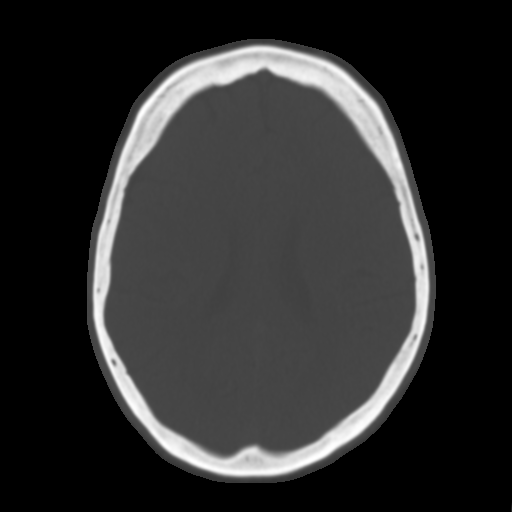
[im 20/34  brain]
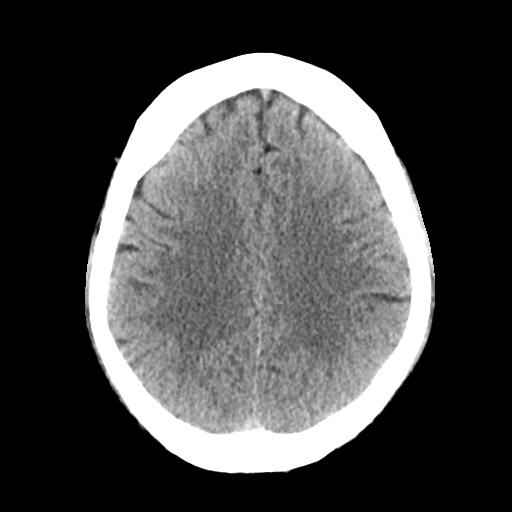
[im 22/34  brain]
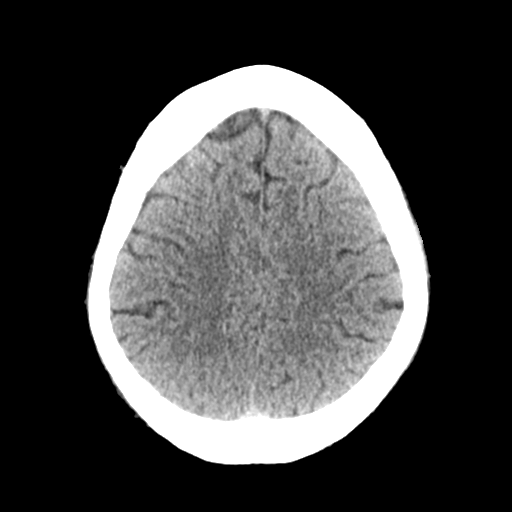
[im 24/34  brain]
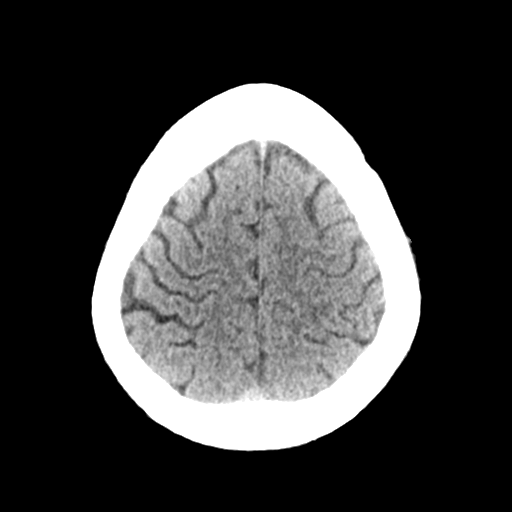
[im 26/34  brain]
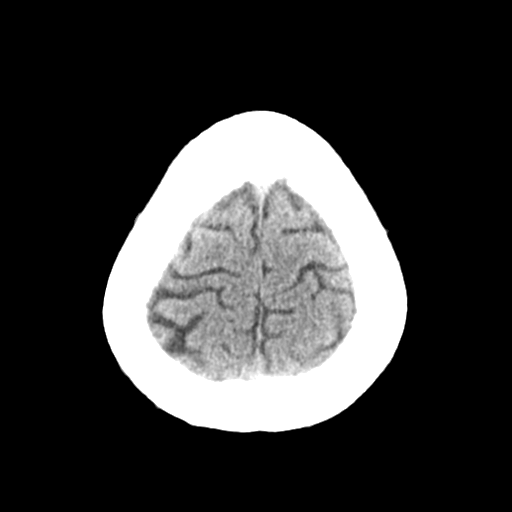
[im 26/34  bone]
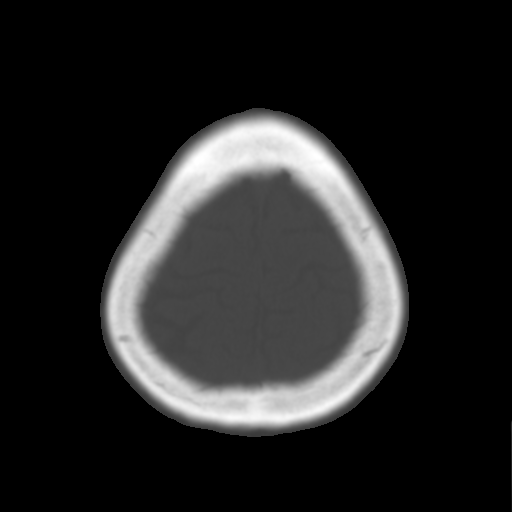
[im 28/34  brain]
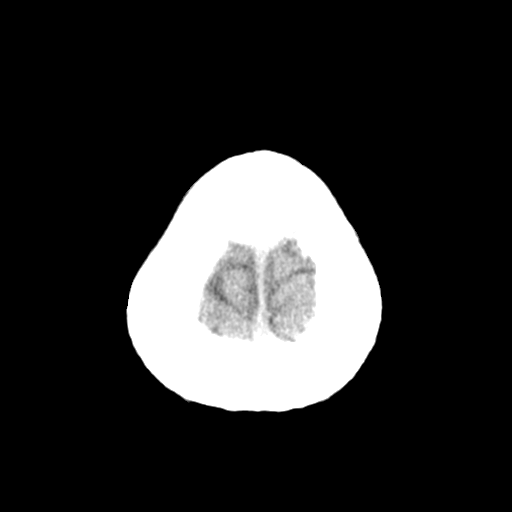
[im 30/34  brain]
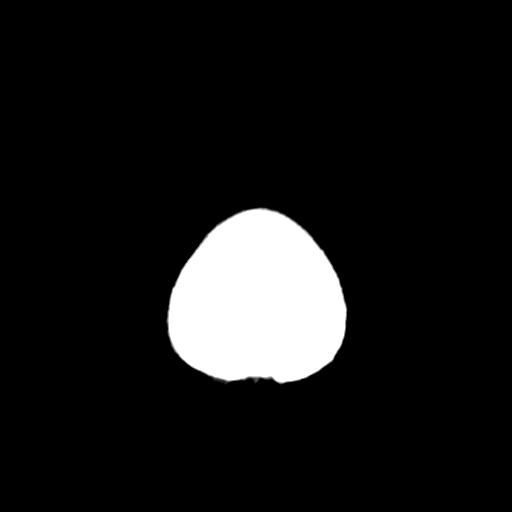
[im 32/34  brain]
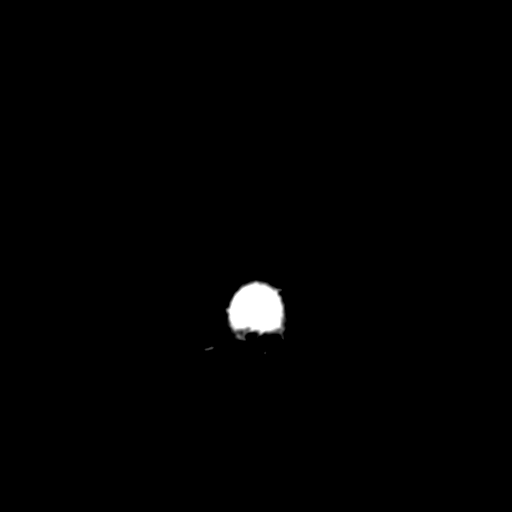

[16 of 30 positions shown; findings below may reference images not displayed]

FINDINGS: CT HEAD FINDINGS

Brain: No evidence of acute infarction, hemorrhage, hydrocephalus,
extra-axial collection or mass lesion/mass effect.

Vascular: No hyperdense vessel or unexpected calcification.

Skull: Normal. Negative for fracture or focal lesion.

Other: None.

CT ORBITS FINDINGS

Orbits: No traumatic or inflammatory finding. Globes, optic nerves,
orbital fat, extraocular muscles, vascular structures, and lacrimal
glands are normal.

Visualized sinuses: Clear.

Soft tissues: Negative.
IMPRESSION: 1. Normal head CT.
2. No abnormality seen in the orbits.

## 2020-02-12 ENCOUNTER — Ambulatory Visit: Payer: Self-pay | Admitting: Family Medicine

## 2020-02-12 ENCOUNTER — Telehealth (INDEPENDENT_AMBULATORY_CARE_PROVIDER_SITE_OTHER): Payer: Medicare HMO | Admitting: Family Medicine

## 2020-02-12 ENCOUNTER — Other Ambulatory Visit: Payer: Self-pay

## 2020-02-12 DIAGNOSIS — E1121 Type 2 diabetes mellitus with diabetic nephropathy: Secondary | ICD-10-CM

## 2020-02-12 DIAGNOSIS — Z794 Long term (current) use of insulin: Secondary | ICD-10-CM | POA: Diagnosis not present

## 2020-02-12 MED ORDER — LANTUS SOLOSTAR 100 UNIT/ML ~~LOC~~ SOPN: 30.0000 [IU] | PEN_INJECTOR | Freq: Every day | SUBCUTANEOUS | 5 refills | Status: DC

## 2020-02-12 NOTE — Progress Notes (Signed)
MyChart Video Visit    Virtual Visit via Video Note   This visit type was conducted due to national recommendations for restrictions regarding the COVID-19 Pandemic (e.g. social distancing) in an effort to limit this patient's exposure and mitigate transmission in our community. This patient is at least at moderate risk for complications without adequate follow up. This format is felt to be most appropriate for this patient at this time. Physical exam was limited by quality of the video and audio technology used for the visit.   Patient location: home Provider location: [facility]  I discussed the limitations of evaluation and management by telemedicine and the availability of in person appointments. The patient expressed understanding and agreed to proceed.  Patient: James Moreno   DOB: [birth year]   54 y.o. Male  MRN: [id number] Visit Date: 02/12/2020  Today's healthcare provider: Lelon Huh, MD   No chief complaint on file.  Subjective    HPI  Diabetes Mellitus Type II, Follow-up  Lab Results  Component Value Date   HGBA1C 9.7 (H) 09/17/2019   HGBA1C 6.7 12/12/2018   HGBA1C 10.4 (A) 04/21/2010   Wt Readings from Last 3 Encounters:  01/26/20 213 lb (96.6 kg)  01/13/20 211 lb 8 oz (95.9 kg)  12/21/19 214 lb 2 oz (97.1 kg)   Last seen for diabetes 6 months ago.  Management since then includes increasing Tresiba to 30 units daily. He reports good compliance with treatment. He is not having side effects.  Symptoms: Yes fatigue No foot ulcerations  No appetite changes Yes nausea  No paresthesia of the feet  No polydipsia  No polyuria Yes visual disturbances   No vomiting     Home blood sugar records: trend: fluctuating a lot  Episodes of hypoglycemia? No    Most Recent Eye Exam: due Current exercise: none Current diet habits: in general, an "unhealthy" diet  Pertinent Labs: Lab Results  Component Value Date   CHOL 103 12/12/2018   HDL 40 12/12/2018    LDLCALC 45 12/12/2018   TRIG 95 12/12/2018   CHOLHDL 5.5 (H) 01/10/2018   Lab Results  Component Value Date   NA 132 (L) 09/17/2019   K 4.5 09/17/2019   CREATININE 1.92 (H) 09/17/2019   GFRNONAA 39 (L) 09/17/2019   GFRAA 45 (L) 09/17/2019   GLUCOSE 432 (H) 09/17/2019      Patient has changed insurances and he wants to discuss affordable options for his diabetes medications.      Medications: Outpatient Medications Prior to Visit  Medication Sig  . acetaminophen (TYLENOL) 500 MG tablet Take 500 mg by mouth every 6 (six) hours as needed (headaches.).  Marland Kitchen alprazolam (XANAX) 2 MG tablet Take 1 tablet (2 mg total) by mouth 3 (three) times daily as needed for sleep.  Marland Kitchen amLODipine (NORVASC) 10 MG tablet Take 10 mg by mouth daily.   Marland Kitchen aspirin EC 81 MG tablet Take 1 tablet (81 mg total) by mouth daily. Swallow whole.  . B-D ULTRAFINE III SHORT PEN 31G X 8 MM MISC   . BD INSULIN SYRINGE U/F 31G X 5/16" 1 ML MISC   . Blood Glucose Monitoring Suppl (GLUCOCOM BLOOD GLUCOSE MONITOR) DEVI Frequency:ONCE   Dosage:0.0     Instructions:  Note:Dose: N/A  . calcitRIOL (ROCALTROL) 0.25 MCG capsule   . carvedilol (COREG) 6.25 MG tablet TAKE 1 TABLET TWICE A DAY WITH MEALS (NEED APPOINTMENT FOR FURTHER REFILLS) (Patient taking differently: Take 12.5 mg by mouth 2 (two) times  daily with a meal. )  . cinacalcet (SENSIPAR) 30 MG tablet Take 30 mg by mouth daily.  . Continuous Blood Gluc Receiver (FREESTYLE LIBRE 14 DAY READER) DEVI   . Continuous Blood Gluc Sensor (FREESTYLE LIBRE 14 DAY SENSOR) MISC   . EDEX 40 MCG injection 40 mcg by Intracavitary route as needed.   . fenofibrate (TRICOR) 145 MG tablet Take 1 tablet (145 mg total) by mouth daily. (Patient taking differently: Take 0.5 mg by mouth daily. )  . ibuprofen (ADVIL,MOTRIN) 200 MG tablet Take 200-400 mg by mouth every 8 (eight) hours as needed (for headaches.).  Marland Kitchen insulin aspart (NOVOLOG) 100 UNIT/ML injection Inject 10-16 Units into the skin 3  (three) times daily before meals. Sliding scale (01/14/19 - Pt uses 30 units TID with meals, plus SS of 2 units per 50 over 150)  . Insulin Degludec (TRESIBA FLEXTOUCH [place]) Inject 30 Units into the skin daily with lunch.  (Patient not taking: Reported on 01/19/2020)  . Lancets (ONETOUCH DELICA PLUS [id number]) MISC   . losartan (COZAAR) 100 MG tablet Take 1 tablet (100 mg total) by mouth at bedtime.  . mycophenolate (MYFORTIC) 360 MG TBEC EC tablet Take 720 mg by mouth 2 (two) times daily.   . naloxone (NARCAN) nasal spray 4 mg/0.1 mL Place 1 spray into the nose once.   Marland Kitchen omeprazole (PRILOSEC) 20 MG capsule Take 2 capsules (40 mg total) by mouth 2 (two) times daily before a meal.  . ONETOUCH VERIO test strip   . oxyCODONE (OXY IR/ROXICODONE) 5 MG immediate release tablet Take 1-2 tablets (5-10 mg total) by mouth every 6 (six) hours as needed for severe pain.  . rosuvastatin (CRESTOR) 40 MG tablet Take 1 tablet (40 mg total) by mouth daily.  . sildenafil (VIAGRA) 100 MG tablet TAKE ONE-HALF (1/2) TO ONE TABLET DAILY AS NEEDED FOR ERECTILE DYSFUNCTION  . tacrolimus (PROGRAF) 1 MG capsule Take 3 mg by mouth 2 (two) times daily.   Marland Kitchen tretinoin (RETIN-A) 0.05 % cream Apply topically as needed.   . triamcinolone cream (KENALOG) 0.1 % APPLY DAILY TO INFLAMED BUMPS AS NEEDED  . XYOSTED 100 MG/0.5ML SOAJ once a week. Every sunday   No facility-administered medications prior to visit.    Review of Systems  Constitutional: Positive for activity change and fatigue.  Respiratory: Negative for cough and shortness of breath.   Cardiovascular: Negative for chest pain, palpitations and leg swelling.  Endocrine: Negative for cold intolerance, heat intolerance, polydipsia, polyphagia and polyuria.  Musculoskeletal: Positive for arthralgias.  Neurological: Positive for headaches.  Psychiatric/Behavioral: Positive for sleep disturbance. Negative for agitation. The patient is not nervous/anxious.        Objective    There were no vitals taken for this visit.   Physical Exam  Awake, alert, oriented x 3. In no apparent distress    Assessment & Plan     1. Type 2 diabetes mellitus with diabetic nephropathy, with long-term current use of insulin ([place])  Tyler Aas now too expensive due to insurance change. See if generic- insulin glargine (LANTUS SOLOSTAR) 100 UNIT/ML Solostar Pen; Inject 30 Units into the skin daily.  Dispense: 15 mL; Refill: 5 is better covered. Given samples of 3 Tresiba pens to get by if insulin glargine not covered. Consider patient assistance program      I discussed the assessment and treatment plan with the patient. The patient was provided an opportunity to ask questions and all were answered. The patient agreed with the plan and  demonstrated an understanding of the instructions.   The patient was advised to call back or seek an in-person evaluation if the symptoms worsen or if the condition fails to improve as anticipated.  I provided 15 minutes of non-face-to-face time during this encounter.  Video connection was lost when less than 50% of the duration of the visit was complete, at which time the remainder of the visit was completed via audio only.  The entirety of the information documented in the History of Present Illness, Review of Systems and Physical Exam were personally obtained by me. Portions of this information were initially documented by the CMA and reviewed by me for thoroughness and accuracy.     Lelon Huh, MD [facility] [phone] (phone) [phone] (fax)  [place]

## 2020-02-15 DIAGNOSIS — E113592 Type 2 diabetes mellitus with proliferative diabetic retinopathy without macular edema, left eye: Secondary | ICD-10-CM | POA: Diagnosis not present

## 2020-02-15 LAB — HM DIABETES EYE EXAM

## 2020-02-17 NOTE — Chronic Care Management (AMB) (Signed)
  Chronic Care Management   Note  02/17/2020 Name: James Moreno MRN: [id number] DOB: October 05, 1965  James Moreno is a 54 y.o. year old male who is a primary care patient of Caryn Section, Kirstie Peri, MD. I reached out to James Moreno by phone today in response to a referral sent by James Moreno PCP, James Sons, MD.     James Moreno was given information about Chronic Care Management services today including:  1. CCM service includes personalized support from designated clinical staff supervised by his physician, including individualized plan of care and coordination with other care providers 2. 24/7 contact phone numbers for assistance for urgent and routine care needs. 3. Service will only be billed when office clinical staff spend 20 minutes or more in a month to coordinate care. 4. Only one practitioner may furnish and bill the service in a calendar month. 5. The patient may stop CCM services at any time (effective at the end of the month) by phone call to the office staff. 6. The patient will be responsible for cost sharing (co-pay) of up to 20% of the service fee (after annual deductible is met).  Patient agreed to services and verbal consent obtained.   Follow up plan: Telephone appointment with care management team member scheduled for:03/04/2020  [facility]

## 2020-02-21 ENCOUNTER — Other Ambulatory Visit: Payer: Self-pay | Admitting: Family Medicine

## 2020-02-21 NOTE — Telephone Encounter (Signed)
Requested medication (s) are due for refill today: yes  Requested medication (s) are on the active medication list: yes  Last refill:  11/24/19  Future visit scheduled: no  Notes to clinic:  med not delegated to NT to RF   Requested Prescriptions  Pending Prescriptions Disp Refills   alprazolam (XANAX) 2 MG tablet [Pharmacy Med Name: ALPRAZolam 2 MG Oral Tablet] 90 tablet 0    Sig: TAKE 1 TABLET BY MOUTH THREE TIMES DAILY AS NEEDED FOR SLEEP      Not Delegated - Psychiatry:  Anxiolytics/Hypnotics Failed - 02/21/2020 10:08 AM      Failed - This refill cannot be delegated      Failed - Urine Drug Screen completed in last 360 days      Passed - Valid encounter within last 6 months    Recent Outpatient Visits           1 week ago Type 2 diabetes mellitus with diabetic nephropathy, with long-term current use of insulin ([place])   [facility] Birdie Sons, MD   6 months ago Type 2 diabetes mellitus with diabetic nephropathy, without long-term current use of insulin [facility])   [facility] Birdie Sons, MD   1 year ago Mixed hyperlipidemia   [facility] Birdie Sons, MD   2 years ago Hypertension secondary to other renal disorders   [facility] Birdie Sons, MD   2 years ago [place], Kirstie Peri, MD       Future Appointments             Tomorrow Gollan, Kathlene November, MD [facility], [place]   In 1 month Vanga, Tally Due, MD [place]   In 2 months Fletcher Anon, Mertie Clause, MD [facility], [place]

## 2020-02-21 NOTE — Progress Notes (Signed)
Evaluation Performed:  Follow-up visit  Date:  02/22/2020   ID:  James Moreno, DOB Jan 20, 1966, MRN [id number]  Patient Location:  [place] [address]   Provider location:   [facility], [place] office  PCP:  Birdie Sons, MD  Cardiologist:  Arvid Right [facility]  Chief Complaint  Patient presents with  . Follow-up    2 Months follow up and per patient he has no issues or concerns he wishes to discuss at today's visit. He would like to discuss some options for smoking cessation.  Medications verbally reviewed with patient.      History of Present Illness:    James Moreno is a 54 y.o. male  past medical history of diabetes,  previously on peritoneal hemodialysis,   kidney transplant April 2016 in [place],  hypertension ,  long smoking history who continues to smoke,  Bilateral carotid disease, 50% LE arterial disease evaluated for chest pain in the past, presenting for routine followup of his PAD.   Weight down since his last clinic visit with me over a year ago Still does not eat well Continues to smoke, would like to try Chantix in effort to quit smoking Followed by Dr. Holley Raring for his end-stage renal disease, transplant Reports blood pressure well controlled, denies any claudication type symptoms Lower extremity Dopplers reviewed with him  Discussed recent carotid ultrasound images, moderate disease noted  No recent lipid panel available, reports having all his lab work done through nephrology Would like to have repeat lipid panel done through them  Denies anginal symptoms  Other past medical history previously required iron infusions  history of chronic discomfort in his legs. Reports having ABIs with Dr. Francoise Schaumann   Prior CV studies:   The following studies were reviewed today:  Echocardiogram over the past 6 months was essentially normal, normal ejection fraction estimated at greater than 55%  . This was done in preparation for kidney transplant listing .   stress test in 2013 showed no ischemia, done at [facility], repeat stress test in the past month or so at [facility]  Did stress test in the past on treadmill, had trouble Also did lexiscan 2015, had severe SOB   Past Medical History:  Diagnosis Date  . Acute kidney failure, unspecified (James Moreno)   . Anxiety   . Diabetes mellitus, type 2 (James Moreno)   . GERD (gastroesophageal reflux disease)   . History of arterial disease of lower extremity   . History of hepatitis B   . Hypertension   . Hypothyroidism   . Mitral valve disorders(424.0)   . Pityriasis 12/27/2014  . Primary pulmonary HTN (James Moreno)    Pt denies ever having.  . Tricuspid valve disorders, specified as nonrheumatic    Past Surgical History:  Procedure Laterality Date  . APPENDECTOMY    . Carotid Doppler Ultrasound  06/14/2009   39% stenosis of bilateral internal carotid artery, bilateral anterograde vertebral flow  . ESOPHAGOGASTRODUODENOSCOPY (EGD) WITH PROPOFOL N/A 01/21/2019   Procedure: ESOPHAGOGASTRODUODENOSCOPY (EGD) WITH PROPOFOL;  Surgeon: Lin Landsman, MD;  Location: [place];  Service: Endoscopy;  Laterality: N/A;  Diabetic - insulin  . EYE SURGERY     right  . HEMORROIDECTOMY    . KIDNEY TRANSPLANT Right 2016  . MECKEL DIVERTICULUM EXCISION     infancy  . Myocardial Perfusion scan  01/17/2009   [facility], non- ischemic. LVEF= 55%  . PARS PLANA VITRECTOMY  Left 02/09/2015   Procedure: Pan retinal photocoagulation 49449;  Surgeon: Milus Height, MD;  Location: [facility];  Service: Ophthalmology;  Laterality: Left;  . REFRACTIVE SURGERY Left   . sleep study  01/09/2011   Severe sleep apnea. AHI 72.9/hr. RDI=83.0/hr. Desaturation to 69.0% Emergency CPAP titaration to 14.0cm (01/14/19 resolved after wt loss after kidney transplant.)     No outpatient medications have been marked as taking for the 02/22/20 encounter  (Office Visit) with Minna Merritts, MD.     Allergies:   No known allergies   Social History   Tobacco Use  . Smoking status: Current Every Day Smoker    Packs/day: 0.75    Years: 38.00    Pack years: 28.50    Types: Cigarettes  . Smokeless tobacco: Never Used  . Tobacco comment: since age 71.  Vaping Use  . Vaping Use: Former  Substance Use Topics  . Alcohol use: Yes    Alcohol/week: 0.0 - 1.0 standard drinks    Comment: Excessive alcohol consumption in the past. Quit around 2016. Drinks occasionally.  . Drug use: No     Current Outpatient Medications on File Prior to Visit  Medication Sig Dispense Refill  . acetaminophen (TYLENOL) 500 MG tablet Take 500 mg by mouth every 6 (six) hours as needed (headaches.).    Marland Kitchen alprazolam (XANAX) 2 MG tablet TAKE 1 TABLET BY MOUTH THREE TIMES DAILY AS NEEDED FOR SLEEP 90 tablet 2  . amLODipine (NORVASC) 10 MG tablet Take 10 mg by mouth daily.     Marland Kitchen aspirin EC 81 MG tablet Take 1 tablet (81 mg total) by mouth daily. Swallow whole.    . B-D ULTRAFINE III SHORT PEN 31G X 8 MM MISC     . BD INSULIN SYRINGE U/F 31G X 5/16" 1 ML MISC     . Blood Glucose Monitoring Suppl (GLUCOCOM BLOOD GLUCOSE MONITOR) DEVI Frequency:ONCE   Dosage:0.0     Instructions:  Note:Dose: N/A    . calcitRIOL (ROCALTROL) 0.25 MCG capsule     . carvedilol (COREG) 6.25 MG tablet TAKE 1 TABLET TWICE A DAY WITH MEALS (NEED APPOINTMENT FOR FURTHER REFILLS) (Patient taking differently: Take 12.5 mg by mouth 2 (two) times daily with a meal. ) 180 tablet 1  . cinacalcet (SENSIPAR) 30 MG tablet Take 30 mg by mouth daily.    . Continuous Blood Gluc Receiver (FREESTYLE LIBRE 14 DAY READER) DEVI     . Continuous Blood Gluc Sensor (FREESTYLE LIBRE 14 DAY SENSOR) MISC     . EDEX 40 MCG injection 40 mcg by Intracavitary route as needed.     . fenofibrate (TRICOR) 145 MG tablet Take 1 tablet (145 mg total) by mouth daily. (Patient taking differently: Take 0.5 mg by mouth daily. ) 30  tablet 0  . ibuprofen (ADVIL,MOTRIN) 200 MG tablet Take 200-400 mg by mouth every 8 (eight) hours as needed (for headaches.).    Marland Kitchen insulin aspart (NOVOLOG) 100 UNIT/ML injection Inject 10-16 Units into the skin 3 (three) times daily before meals. Sliding scale (01/14/19 - Pt uses 30 units TID with meals, plus SS of 2 units per 50 over 150)    . Insulin Degludec (TRESIBA FLEXTOUCH [place]) Inject 30 Units into the skin daily with lunch.     . insulin glargine (LANTUS SOLOSTAR) 100 UNIT/ML Solostar Pen Inject 30 Units into the skin daily. 15 mL 5  . Lancets (ONETOUCH DELICA PLUS QPRFFM38G) MISC     . losartan (COZAAR) 100  MG tablet Take 1 tablet (100 mg total) by mouth at bedtime. 90 tablet 2  . mycophenolate (MYFORTIC) 360 MG TBEC EC tablet Take 720 mg by mouth 2 (two) times daily.     . naloxone (NARCAN) nasal spray 4 mg/0.1 mL Place 1 spray into the nose once.     Marland Kitchen omeprazole (PRILOSEC) 20 MG capsule Take 2 capsules (40 mg total) by mouth 2 (two) times daily before a meal. 360 capsule 4  . ONETOUCH VERIO test strip     . oxyCODONE (OXY IR/ROXICODONE) 5 MG immediate release tablet Take 1-2 tablets (5-10 mg total) by mouth every 6 (six) hours as needed for severe pain. 240 tablet 0  . rosuvastatin (CRESTOR) 40 MG tablet Take 1 tablet (40 mg total) by mouth daily. 30 tablet 0  . sildenafil (VIAGRA) 100 MG tablet TAKE ONE-HALF (1/2) TO ONE TABLET DAILY AS NEEDED FOR ERECTILE DYSFUNCTION 30 tablet 3  . tacrolimus (PROGRAF) 1 MG capsule Take 3 mg by mouth 2 (two) times daily.   11  . tretinoin (RETIN-A) 0.05 % cream Apply topically as needed.     . triamcinolone cream (KENALOG) 0.1 % APPLY DAILY TO INFLAMED BUMPS AS NEEDED    . XYOSTED 100 MG/0.5ML SOAJ once a week. Every sunday     No current facility-administered medications on file prior to visit.     Family Hx: The patient's family history includes Hyperlipidemia in his mother; Hypertension in his mother; Melanoma in his father.  ROS:   Please  see the history of present illness.    Review of Systems  Constitutional: Negative.   HENT: Negative.   Respiratory: Negative.   Cardiovascular: Negative.   Gastrointestinal: Negative.   Musculoskeletal: Negative.   Neurological: Negative.   Psychiatric/Behavioral: Negative.   All other systems reviewed and are negative.    Labs/Other Tests and Data Reviewed:    Recent Labs: 09/17/2019: ALT 9; BUN 25; Creatinine, Ser 1.92; Potassium 4.5; Sodium 132   Recent Lipid Panel Lab Results  Component Value Date/Time   CHOL 103 12/12/2018 12:00 AM   CHOL 166 01/10/2018 08:21 AM   TRIG 95 12/12/2018 12:00 AM   HDL 40 12/12/2018 12:00 AM   HDL 30 (L) 01/10/2018 08:21 AM   CHOLHDL 5.5 (H) 01/10/2018 08:21 AM   LDLCALC 45 12/12/2018 12:00 AM   LDLCALC 77 01/10/2018 08:21 AM    Wt Readings from Last 3 Encounters:  02/22/20 214 lb (97.1 kg)  01/26/20 213 lb (96.6 kg)  01/13/20 211 lb 8 oz (95.9 kg)     Exam:    Vital Signs: Vital signs may also be detailed in the HPI BP 140/70 (BP Location: Left Arm, Patient Position: Sitting, Cuff Size: Normal)   Pulse 100   Ht 5\' 11"  (1.803 m)   Wt 214 lb (97.1 kg)   SpO2 98%   BMI 29.85 kg/m   Constitutional:  oriented to person, place, and time. No distress.  HENT:  Head: Grossly normal Eyes:  no discharge. No scleral icterus.  Neck: No JVD, no carotid bruits  Cardiovascular: Regular rate and rhythm, no murmurs appreciated Pulmonary/Chest: Clear to auscultation bilaterally, no wheezes or rails Abdominal: Soft.  no distension.  no tenderness.  Musculoskeletal: Normal range of motion Neurological:  normal muscle tone. Coordination normal. No atrophy Skin: Skin warm and dry Psychiatric: normal affect, pleasant  ASSESSMENT & PLAN:    PAD (peripheral artery disease) (HCC) Reports having problems on aspirin, excessive bruising Cholesterol previously at goal,  no changes made Discussed carotid and lower extremity arterial Dopplers with  him Stressed importance of smoking cessation  Atherosclerosis of artery of extremity with ulceration ([place]) Denies significant claudication symptoms on today's visit  Bilateral carotid artery disease, unspecified type ([place]) Followed by [facility]  Mixed hyperlipidemia Tolerating statin, no changes made Goal LDL less than 70  Hypertension secondary to other renal disorders Blood pressure is well controlled on today's visit. No changes made to the medications.  Chest discomfort Discussed anginal symptoms for watch for, currently stable with no significant symptoms  Continue current medication regimen.  Medication Changes: No changes made   Total encounter time more than 25 minutes  Greater than 50% was spent in counseling and coordination of care with the patient   Signed, Ida Rogue, MD  02/22/2020 12:13 PM    [place] [facility] [address], [place], [place] [address]

## 2020-02-22 ENCOUNTER — Other Ambulatory Visit: Payer: Self-pay | Admitting: Family Medicine

## 2020-02-22 ENCOUNTER — Ambulatory Visit: Payer: Medicare HMO | Admitting: Cardiovascular Disease

## 2020-02-22 ENCOUNTER — Other Ambulatory Visit: Payer: Self-pay

## 2020-02-22 ENCOUNTER — Ambulatory Visit (INDEPENDENT_AMBULATORY_CARE_PROVIDER_SITE_OTHER): Payer: Medicare HMO | Admitting: Cardiovascular Disease

## 2020-02-22 ENCOUNTER — Encounter: Payer: Self-pay | Admitting: Cardiovascular Disease

## 2020-02-22 ENCOUNTER — Telehealth: Payer: Self-pay

## 2020-02-22 VITALS — BP 140/70 | HR 100 | Ht 71.0 in | Wt 214.0 lb

## 2020-02-22 DIAGNOSIS — Z720 Tobacco use: Secondary | ICD-10-CM | POA: Diagnosis not present

## 2020-02-22 DIAGNOSIS — R079 Chest pain, unspecified: Secondary | ICD-10-CM

## 2020-02-22 DIAGNOSIS — N186 End stage renal disease: Secondary | ICD-10-CM | POA: Diagnosis not present

## 2020-02-22 DIAGNOSIS — I70299 Other atherosclerosis of native arteries of extremities, unspecified extremity: Secondary | ICD-10-CM

## 2020-02-22 DIAGNOSIS — L97909 Non-pressure chronic ulcer of unspecified part of unspecified lower leg with unspecified severity: Secondary | ICD-10-CM

## 2020-02-22 DIAGNOSIS — I10 Essential (primary) hypertension: Secondary | ICD-10-CM | POA: Diagnosis not present

## 2020-02-22 DIAGNOSIS — R52 Pain, unspecified: Secondary | ICD-10-CM

## 2020-02-22 DIAGNOSIS — L905 Scar conditions and fibrosis of skin: Secondary | ICD-10-CM

## 2020-02-22 DIAGNOSIS — M79604 Pain in right leg: Secondary | ICD-10-CM

## 2020-02-22 DIAGNOSIS — I779 Disorder of arteries and arterioles, unspecified: Secondary | ICD-10-CM | POA: Diagnosis not present

## 2020-02-22 DIAGNOSIS — I739 Peripheral vascular disease, unspecified: Secondary | ICD-10-CM | POA: Diagnosis not present

## 2020-02-22 MED ORDER — VARENICLINE TARTRATE 1 MG PO TABS: 1.0000 mg | ORAL_TABLET | Freq: Two times a day (BID) | ORAL | 4 refills | Status: DC

## 2020-02-22 NOTE — Patient Instructions (Addendum)
Medication Instructions:  Chantix sent in  If you need a refill on your cardiac medications before your next appointment, please call your pharmacy.    Lab work: No new labs needed   If you have labs (blood work) drawn today and your tests are completely normal, you will receive your results only by: Marland Kitchen MyChart Message (if you have MyChart) OR . A paper copy in the mail If you have any lab test that is abnormal or we need to change your treatment, we will call you to review the results.   Testing/Procedures: No new testing needed   Follow-Up: At [facility], you and your health needs are our priority.  As part of our continuing mission to provide you with exceptional heart care, we have created designated Provider Care Teams.  These Care Teams include your primary Cardiologist (physician) and Advanced Practice Providers (APPs -  Physician Assistants and Nurse Practitioners) who all work together to provide you with the care you need, when you need it.  . You will need a follow up appointment in 12 months  . Providers on your designated Care Team:   . Murray Hodgkins, NP . Christell Faith, PA-C . Marrianne Mood, PA-C  Any Other Special Instructions Will Be Listed Below (If Applicable).  COVID-19 Vaccine Information can be found at: ShippingScam.co.uk For questions related to vaccine distribution or appointments, please email [email or web address] or call [phone].

## 2020-02-22 NOTE — Telephone Encounter (Signed)
Copied from [place] [id number]. Topic: General - Other >> Feb 22, 2020 10:21 AM Leward Quan A wrote: Reason for CRM: Patient called in he is at the dentist office at [facility] and they need a copy of his medication list sent over right away. Please fax to [place] Fax# [phone]

## 2020-02-22 NOTE — Telephone Encounter (Signed)
Requested medication (s) are due for refill today: yes  Requested medication (s) are on the active medication list: yes  Last refill:  01/27/2020  Future visit scheduled: no  Notes to clinic:  this refill cannot be delegated  Requested Prescriptions  Pending Prescriptions Disp Refills   oxyCODONE (OXY IR/ROXICODONE) 5 MG immediate release tablet 240 tablet 0    Sig: Take 1-2 tablets (5-10 mg total) by mouth every 6 (six) hours as needed for severe pain.      Not Delegated - Analgesics:  Opioid Agonists Failed - 02/22/2020 10:26 AM      Failed - This refill cannot be delegated      Failed - Urine Drug Screen completed in last 360 days      Passed - Valid encounter within last 6 months    Recent Outpatient Visits           1 week ago Type 2 diabetes mellitus with diabetic nephropathy, with long-term current use of insulin ([place])   [facility] Birdie Sons, MD   6 months ago Type 2 diabetes mellitus with diabetic nephropathy, without long-term current use of insulin [facility])   [facility] Birdie Sons, MD   1 year ago Mixed hyperlipidemia   [facility] Birdie Sons, MD   2 years ago Hypertension secondary to other renal disorders   [facility] Birdie Sons, MD   2 years ago Hubbard, Kirstie Peri, MD       Future Appointments             Today Gollan, Kathlene November, MD [facility], [place]   In 1 month Vanga, Tally Due, MD [place]   In 2 months Fletcher Anon, Mertie Clause, MD [facility], [place]

## 2020-02-22 NOTE — Telephone Encounter (Signed)
Copied from [place] [id number]. Topic: Quick Communication - Rx Refill/Question >> Feb 22, 2020 10:19 AM James Moreno wrote: Medication: oxyCODONE (OXY IR/ROXICODONE) 5 MG immediate release tablet   Has the patient contacted their pharmacy? Yes.   (Agent: If no, request that the patient contact the pharmacy for the refill.) (Agent: If yes, when and what did the pharmacy advise?)  Preferred Pharmacy (with phone number or street name): [place], [place]  Phone:  [phone] Fax:  [phone]     Agent: Please be advised that RX refills may take up to 3 business days. We ask that you follow-up with your pharmacy.

## 2020-02-24 NOTE — Telephone Encounter (Signed)
Left message to call back. If patient still needs this faxed to his dentist,  then he will have to have the Dentist fax over to our office a signed Release of information form. OK for pec to advise.

## 2020-02-25 NOTE — Telephone Encounter (Signed)
Patient is calling again to check the status of his medication request.

## 2020-02-26 MED ORDER — OXYCODONE HCL 5 MG PO TABS: 5.0000 mg | ORAL_TABLET | Freq: Four times a day (QID) | ORAL | 0 refills | Status: DC | PRN

## 2020-03-01 ENCOUNTER — Telehealth: Payer: Self-pay | Admitting: Family Medicine

## 2020-03-01 NOTE — Telephone Encounter (Signed)
Medication Refill - Medication: Omeprazole 20mg   Has the patient contacted their pharmacy? Yes.   (Agent: If no, request that the patient contact the pharmacy for the refill.) (Agent: If yes, when and what did the pharmacy advise?)  Preferred Pharmacy (with phone number or street name): [place], [address]  Agent: Please be advised that RX refills may take up to 3 business days. We ask that you follow-up with your pharmacy.

## 2020-03-03 ENCOUNTER — Telehealth: Payer: Self-pay | Admitting: *Deleted

## 2020-03-03 NOTE — Chronic Care Management (AMB) (Signed)
  Care Management   Note  03/03/2020 Name: James Moreno MRN: [id number] DOB: 1965/05/29  James Moreno is a 54 y.o. year old male who is a primary care patient of Caryn Section, Kirstie Peri, MD and is actively engaged with the care management team. I reached out to [name] by phone today to assist with re-scheduling an initial visit with the Pharmacist  Follow up plan: Unsuccessful telephone outreach attempt made. A HIPAA compliant phone message was left for the patient providing contact information and requesting a return call. The care management team will reach out to the patient again over the next 1 days. If patient returns call to provider office, please advise to call [place] at [phone].  [place] Management  Direct Dial: [phone]

## 2020-03-03 NOTE — Chronic Care Management (AMB) (Signed)
  Care Management   Note  03/03/2020 Name: James Moreno MRN: [id number] DOB: 10-18-1965  James Moreno is a 54 y.o. year old male who is a primary care patient of Caryn Section, Kirstie Peri, MD and is actively engaged with the care management team. I reached out to [name] by phone today to assist with re-scheduling an initial visit with the Pharmacist  Follow up plan: Telephone appointment with care management team member scheduled for: 03/04/2020 at [place] [facility]

## 2020-03-04 ENCOUNTER — Other Ambulatory Visit: Payer: Self-pay | Admitting: Family Medicine

## 2020-03-04 ENCOUNTER — Ambulatory Visit: Payer: Medicare HMO

## 2020-03-04 DIAGNOSIS — E1121 Type 2 diabetes mellitus with diabetic nephropathy: Secondary | ICD-10-CM

## 2020-03-04 DIAGNOSIS — I10 Essential (primary) hypertension: Secondary | ICD-10-CM

## 2020-03-04 MED ORDER — OMEPRAZOLE 20 MG PO CPDR
40.0000 mg | DELAYED_RELEASE_CAPSULE | Freq: Two times a day (BID) | ORAL | 0 refills | Status: DC
Start: 2020-03-04 — End: 2020-05-09

## 2020-03-04 NOTE — Chronic Care Management (AMB) (Signed)
Chronic Care Management Pharmacy  Name: James Moreno  MRN: [id number] DOB: April 18, 1965   Chief Complaint/ HPI  James Moreno,  54 y.o. , male presents for his Initial CCM visit with the clinical pharmacist via telephone.  PCP : Birdie Sons, MD Patient Care Team: Birdie Sons, MD as PCP - General (Family Medicine) Rockey Situ Kathlene November, MD as PCP - Cardiology (Cardiology) Ronnald Collum, Lourdes Sledge, MD as Attending Physician (Endocrinology) Pa, [place] (Optometry) Anthonette Legato, MD (Nephrology) Long, Thomes Cake, MD as Referring Physician (Vascular Surgery) Isaias Sakai, MD as Referring Physician (Ophthalmology) Lin Landsman, MD as Consulting Physician (Gastroenterology) Wellington Hampshire, MD as Consulting Physician (Cardiology) Germaine Pomfret, [facility] (Pharmacist)  Patient's chronic conditions include: Hypertension, Hyperlipidemia, Diabetes, Coronary Artery Disease, GERD, Anxiety, Tobacco use and History of Renal Transplant   Office Visits: 02/12/20: Video visit with Dr. Caryn Section for follow-up. Patient given Lantus 30 units daily.   Consult Visit: 02/22/20: Patient presented to Dr. Rockey Situ (Cardiology) for follow-up. Patient is not a candidate for stenting due to risk of renal failure.   Allergies  Allergen Reactions  . No Known Allergies     Medications: Outpatient Encounter Medications as of 03/04/2020  Medication Sig Note  . acetaminophen (TYLENOL) 500 MG tablet Take 500 mg by mouth every 6 (six) hours as needed (headaches.).   Marland Kitchen alprazolam (XANAX) 2 MG tablet TAKE 1 TABLET BY MOUTH THREE TIMES DAILY AS NEEDED FOR SLEEP   . amLODipine (NORVASC) 10 MG tablet Take 10 mg by mouth daily.    . carvedilol (COREG) 6.25 MG tablet TAKE 1 TABLET TWICE A DAY WITH MEALS (NEED APPOINTMENT FOR FURTHER REFILLS) (Patient taking differently: Take 12.5 mg by mouth 2 (two) times daily with a meal.) 03/04/2020: Carvedilol 12.5 mg twice daily prescribed by Dr.  Holley Raring  . cinacalcet (SENSIPAR) 30 MG tablet Take 30 mg by mouth daily.   Marland Kitchen EDEX 40 MCG injection 40 mcg by Intracavitary route as needed.    . fenofibrate (TRICOR) 145 MG tablet Take 1 tablet (145 mg total) by mouth daily. (Patient taking differently: Take 0.5 mg by mouth daily.)   . insulin aspart (NOVOLOG) 100 UNIT/ML injection Inject 10-15 Units into the skin 3 (three) times daily before meals.   . Insulin Degludec (TRESIBA FLEXTOUCH [place]) Inject 30 Units into the skin daily with lunch.    . losartan (COZAAR) 100 MG tablet Take 1 tablet (100 mg total) by mouth at bedtime.   . mycophenolate (MYFORTIC) 360 MG TBEC EC tablet Take 720 mg by mouth 2 (two) times daily.    Marland Kitchen oxyCODONE (OXY IR/ROXICODONE) 5 MG immediate release tablet Take 1-2 tablets (5-10 mg total) by mouth every 6 (six) hours as needed for severe pain.   . rosuvastatin (CRESTOR) 40 MG tablet Take 1 tablet (40 mg total) by mouth daily.   . tacrolimus (PROGRAF) 1 MG capsule Take 3 mg by mouth 2 (two) times daily.    . XYOSTED 100 MG/0.5ML SOAJ once a week. Every sunday   . [DISCONTINUED] omeprazole (PRILOSEC) 20 MG capsule Take 2 capsules (40 mg total) by mouth 2 (two) times daily before a meal.   . B-D ULTRAFINE III SHORT PEN 31G X 8 MM MISC    . BD INSULIN SYRINGE U/F 31G X 5/16" 1 ML MISC    . Blood Glucose Monitoring Suppl (GLUCOCOM BLOOD GLUCOSE MONITOR) DEVI Frequency:ONCE   Dosage:0.0     Instructions:  Note:Dose: N/A   .  calcitRIOL (ROCALTROL) 0.25 MCG capsule  (Patient not taking: Reported on 03/04/2020)   . Continuous Blood Gluc Receiver (FREESTYLE LIBRE 14 DAY READER) DEVI    . Continuous Blood Gluc Sensor (FREESTYLE LIBRE 14 DAY SENSOR) MISC    . ibuprofen (ADVIL,MOTRIN) 200 MG tablet Take 200-400 mg by mouth every 8 (eight) hours as needed (for headaches.). (Patient not taking: Reported on 03/04/2020)   . insulin glargine (LANTUS SOLOSTAR) 100 UNIT/ML Solostar Pen Inject 30 Units into the skin daily. (Patient not taking:  Reported on 03/04/2020)   . Lancets (ONETOUCH DELICA PLUS [id number]) MISC    . naloxone (NARCAN) nasal spray 4 mg/0.1 mL Place 1 spray into the nose once.    Glory Rosebush VERIO test strip    . sildenafil (VIAGRA) 100 MG tablet TAKE ONE-HALF (1/2) TO ONE TABLET DAILY AS NEEDED FOR ERECTILE DYSFUNCTION   . tretinoin (RETIN-A) 0.05 % cream Apply topically as needed.    . triamcinolone cream (KENALOG) 0.1 % APPLY DAILY TO INFLAMED BUMPS AS NEEDED   . varenicline (CHANTIX) 1 MG tablet Take 1 tablet (1 mg total) by mouth 2 (two) times daily. (Patient not taking: Reported on 03/04/2020)   . [DISCONTINUED] aspirin EC 81 MG tablet Take 1 tablet (81 mg total) by mouth daily. Swallow whole.    No facility-administered encounter medications on file as of 03/04/2020.    Wt Readings from Last 3 Encounters:  02/22/20 214 lb (97.1 kg)  01/26/20 213 lb (96.6 kg)  01/13/20 211 lb 8 oz (95.9 kg)    Current Diagnosis/Assessment:  SDOH Interventions   Flowsheet Row Most Recent Value  SDOH Interventions   Financial Strain Interventions Other (Comment)  [Will start PAP for insuling and apply for LIS]  Transportation Interventions Intervention Not Indicated      Goals Addressed            This Visit's Progress   . Chronic Care Management       CARE PLAN ENTRY (see longitudinal plan of care for additional care plan information)  Current Barriers:  . Chronic Disease Management support, education, and care coordination needs related to Hypertension, Hyperlipidemia, Diabetes, Coronary Artery Disease, GERD, Anxiety, Tobacco use and History of Renal Transplant    Hypertension BP Readings from Last 3 Encounters:  02/22/20 140/70  01/26/20 140/80  01/13/20 (!) 151/79   . Pharmacist Clinical Goal(s): o Over the next 90 days, patient will work with PharmD and providers to maintain BP goal <130/80 . Current regimen:  . Amlodipine 10 mg daily  . Carvedilol 12.5 mg twice daily  . Losartan 100 mg  daily . Interventions: o Discussed low salt diet and exercising as tolerated extensively o Will initiate blood pressure monitoring plan  . Patient self care activities - Over the next 90 days, patient will: o Check Blood Pressure 3-5 times weekly, document, and provide at future appointments o Ensure daily salt intake < 2300 mg/day  Hyperlipidemia Lab Results  Component Value Date/Time   LDLCALC 45 12/12/2018 12:00 AM   LDLCALC 77 01/10/2018 08:21 AM   . Pharmacist Clinical Goal(s): o Over the next 90 days, patient will work with PharmD and providers to maintain LDL goal < 70 . Current regimen:  o Fenofibrate 72.5 mg daily  o Rosuvastatin 40 mg daily  . Interventions: o Discussed low cholesterol diet and exercising as tolerated extensively o Will initiate cholesterol monitoring plan   Diabetes Lab Results  Component Value Date/Time   HGBA1C 9.7 (H) 09/17/2019 02:17 PM  HGBA1C 6.7 12/12/2018 12:00 AM   HGBA1C 10.4 (A) 04/21/2010 12:00 AM   . Pharmacist Clinical Goal(s): o Over the next 90 days, patient will work with PharmD and providers to achieve A1c goal <7% . Current regimen:  . Novolog 10-15 units based on sliding scale . Tresiba 30 units daily   . Interventions: o Discussed carbohydrate counting and exercising as tolerated extensively o Will initiate blood sugar monitoring plan  . Patient self care activities - Over the next 90 days, patient will: o Check blood sugar 3-4 times daily, document, and provide at future appointments o Contact provider with any episodes of hypoglycemia  Medication management . Pharmacist Clinical Goal(s): o Over the next 90 days, patient will work with PharmD and providers to maintain optimal medication adherence . Current pharmacy: [facility] . Interventions o Comprehensive medication review performed. o Continue current medication management strategy . Patient self care activities - Over the next 90 days, patient will: o Take  medications as prescribed o Report any questions or concerns to PharmD and/or provider(s)      Hypertension   BP goal is:  <130/80  Office blood pressures are  BP Readings from Last 3 Encounters:  02/22/20 140/70  01/26/20 140/80  01/13/20 (!) 151/79   Patient checks BP at home 3-5x per week Patient home BP readings are ranging: 130/75  Patient has failed these meds in the past: NA Patient is currently controlled on the following medications:  . Amlodipine 10 mg daily  . Carvedilol 12.5 mg twice daily  . Losartan 100 mg daily   We discussed diet and exercise extensively   Avoids salt   Plan  Continue current medications   Hyperlipidemia   History of PAD   LDL goal < 70  Last lipids Lab Results  Component Value Date   CHOL 103 12/12/2018   HDL 40 12/12/2018   LDLCALC 45 12/12/2018   TRIG 95 12/12/2018   CHOLHDL 5.5 (H) 01/10/2018   Hepatic Function Latest Ref Rng & Units 09/17/2019 12/12/2018 10/21/2015  Total Protein 6.0 - 8.5 g/dL 6.5 - 7.6  Albumin 3.8 - 4.9 g/dL 4.3 - 4.7  AST 0 - 40 IU/L _0 ALT 0 - 44 IU/L 9 16 32  Alk Phosphatase 48 - 121 IU/L 131(H) 95 188(H)  Total Bilirubin 0.0 - 1.2 mg/dL 0.6 - 0.8     The ASCVD Risk score Mikey Bussing DC Jr., et al., 2013) failed to calculate for the following reasons:   The valid total cholesterol range is 130 to 320 mg/dL   Patient has failed these meds in past: NA Patient is currently controlled on the following medications:  . Fenofibrate 145 mg 1/2 tablet daily (moriarty)  . Rosuvastatin 40 mg daily   We discussed:  Stopped aspirin due to bruising. PAD has been worsening, but patient is not a candidate for stenting due to the need for contrast dye.   Plan  Recommend stopping fenofibrate as patient can likely maintain LDL goal <70 with just rosuvastatin and help with cost concerns.   Diabetes   A1c goal <7%  Recent Relevant Labs: Lab Results  Component Value Date/Time   HGBA1C 9.7 (H) 09/17/2019  02:17 PM   HGBA1C 6.7 12/12/2018 12:00 AM   HGBA1C 10.4 (A) 04/21/2010 12:00 AM   MICROALBUR 100 12/31/2016 02:13 PM    Last diabetic Eye exam:  Lab Results  Component Value Date/Time   HMDIABEYEEXA Retinopathy (A) 02/15/2020 12:00 AM    Last diabetic  Foot exam: No results found for: HMDIABFOOTEX   Checking BG: Daily  Recent FBG Readings: NA Recent pre-meal BG readings: NA Recent 2hr PP BG readings:  NA Recent HS BG readings: NA  Patient has failed these meds in past: NA Patient is currently uncontrolled on the following medications: . Novolog 10-15 units based on sliding scale . Tresiba 30 units daily    We discussed: Patient reports significant affordability concerns with his insulin.  Plan  Continue current medications  Will start PAP for insulin  Depression / Anxiety   PHQ9 Score:  PHQ9 SCORE ONLY 01/19/2020 01/06/2019 01/02/2018  PHQ-9 Total Score 0 0 0   GAD7 Score: No flowsheet data found.  Patient has failed these meds in past: Wellbutrin, zoloft, klonopin  Patient is currently controlled on the following medications:  . Xanax 2 mg TID   We discussed:  Denies side effects of oversedation, dizziness, or feeling unsteady. Counseled patient on risks of frequent BZD use.   Plan  Continue current medications  GERD   Patient denies dysphagia, heartburn or nausea. Expresses understanding to avoid triggers such as citrus juices, large meals and lying down after eating.  Currently controlled on: . Omeprazole 20 mg 2 capsules twice daily   Omeprazole was increased 8-10 months prior which significantly improved his symptoms.   Plan   Continue current medication.  Chronic Pain   Patient has failed these meds in past: NA Patient is currently controlled on the following medications:  . Oxycodone 5 mg 1-2 tablets q6hr PRN   We discussed:  Takes regularly throughout the day, has reversal device that his girlfriend knows how to use. Counseled to avoid  taking with xanax.   Plan  Continue current medications  Vaccines   Reviewed and discussed patient's vaccination history.    Immunization History  Administered Date(s) Administered  . Influenza Split 12/05/2010  . Influenza,inj,Quad PF,6+ Mos 12/31/2016, 01/27/2019  . PFIZER SARS-COV-2 Vaccination 06/18/2019, 07/09/2019, 02/22/2020  . Pneumococcal Polysaccharide-23 10/24/2010  . Tdap 12/05/2010   Medication Management   Patient is likely candidate for LIS.   Patient's preferred pharmacy is:  [facility] [address] [place] [address] [place] [address] Phone: [phone] Fax: [phone]  [facility] ([facility] Only) - [place], [address] [place] [address] Phone: [phone] Fax: [place], [place] [address] [address] Phone: [phone] Fax: [place], [place] [address] Phone: [phone] Fax: [phone]  Uses pill box? Yes  We discussed: Current pharmacy is preferred with insurance plan and patient is satisfied with pharmacy services  Plan  Continue current medication management strategy  Follow up: 1 month phone visit  [place] [phone]

## 2020-03-04 NOTE — Telephone Encounter (Signed)
PT is requesting a 30 day supplied / he already place an order with [facility]  Medication:  omeprazole (PRILOSEC) 20 MG capsule [[id number]]   Has the patient contacted their pharmacy? No  (Agent: If no, request that the patient contact the pharmacy for the refill.)   Preferred Pharmacy (with phone number or street name):  [facility] [address] [place]  [place] [address]  Phone: [phone] Fax: [phone]    Agent: Please be advised that RX refills may take up to 3 business days. We ask that you follow-up with your pharmacy.

## 2020-03-07 ENCOUNTER — Telehealth: Payer: Self-pay | Admitting: Cardiovascular Disease

## 2020-03-07 NOTE — Telephone Encounter (Signed)
Disability forms received and faxed to [facility] medical records. Fax confirmation received

## 2020-03-10 NOTE — Patient Instructions (Signed)
Visit Information It was great speaking with you today!  Please let me know if you have any questions about our visit.  Goals Addressed            This Visit's Progress   . Chronic Care Management       CARE PLAN ENTRY (see longitudinal plan of care for additional care plan information)  Current Barriers:  . Chronic Disease Management support, education, and care coordination needs related to Hypertension, Hyperlipidemia, Diabetes, Coronary Artery Disease, GERD, Anxiety, Tobacco use and History of Renal Transplant    Hypertension BP Readings from Last 3 Encounters:  02/22/20 140/70  01/26/20 140/80  01/13/20 (!) 151/79   . Pharmacist Clinical Goal(s): o Over the next 90 days, patient will work with PharmD and providers to maintain BP goal <130/80 . Current regimen:  . Amlodipine 10 mg daily  . Carvedilol 12.5 mg twice daily  . Losartan 100 mg daily . Interventions: o Discussed low salt diet and exercising as tolerated extensively o Will initiate blood pressure monitoring plan  . Patient self care activities - Over the next 90 days, patient will: o Check Blood Pressure 3-5 times weekly, document, and provide at future appointments o Ensure daily salt intake < 2300 mg/day  Hyperlipidemia Lab Results  Component Value Date/Time   LDLCALC 45 12/12/2018 12:00 AM   LDLCALC 77 01/10/2018 08:21 AM   . Pharmacist Clinical Goal(s): o Over the next 90 days, patient will work with PharmD and providers to maintain LDL goal < 70 . Current regimen:  o Fenofibrate 72.5 mg daily  o Rosuvastatin 40 mg daily  . Interventions: o Discussed low cholesterol diet and exercising as tolerated extensively o Will initiate cholesterol monitoring plan   Diabetes Lab Results  Component Value Date/Time   HGBA1C 9.7 (H) 09/17/2019 02:17 PM   HGBA1C 6.7 12/12/2018 12:00 AM   HGBA1C 10.4 (A) 04/21/2010 12:00 AM   . Pharmacist Clinical Goal(s): o Over the next 90 days, patient will work with  PharmD and providers to achieve A1c goal <7% . Current regimen:  . Novolog 10-15 units based on sliding scale . Tresiba 30 units daily   . Interventions: o Discussed carbohydrate counting and exercising as tolerated extensively o Will initiate blood sugar monitoring plan  . Patient self care activities - Over the next 90 days, patient will: o Check blood sugar 3-4 times daily, document, and provide at future appointments o Contact provider with any episodes of hypoglycemia  Medication management . Pharmacist Clinical Goal(s): o Over the next 90 days, patient will work with PharmD and providers to maintain optimal medication adherence . Current pharmacy: [facility] . Interventions o Comprehensive medication review performed. o Continue current medication management strategy . Patient self care activities - Over the next 90 days, patient will: o Take medications as prescribed o Report any questions or concerns to PharmD and/or provider(s)       James Moreno was given information about Chronic Care Management services today including:  1. CCM service includes personalized support from designated clinical staff supervised by his physician, including individualized plan of care and coordination with other care providers 2. 24/7 contact phone numbers for assistance for urgent and routine care needs. 3. Standard insurance, coinsurance, copays and deductibles apply for chronic care management only during months in which we provide at least 20 minutes of these services. Most insurances cover these services at 100%, however patients may be responsible for any copay, coinsurance and/or deductible if applicable. This service  may help you avoid the need for more expensive face-to-face services. 4. Only one practitioner may furnish and bill the service in a calendar month. 5. The patient may stop CCM services at any time (effective at the end of the month) by phone call to the office staff.  Patient  agreed to services and verbal consent obtained.   The patient verbalized understanding of instructions, educational materials, and care plan provided today and agreed to receive a mailed copy of patient instructions, educational materials, and care plan.  Telephone follow up appointment with pharmacy team member scheduled for: 04/08/20 at 10:00 AM  [place] [phone]

## 2020-03-14 ENCOUNTER — Telehealth: Payer: Self-pay | Admitting: Cardiovascular Disease

## 2020-03-14 NOTE — Telephone Encounter (Signed)
Patient wants to discuss disability status and specifically results of LEA / abi done related to the reason for disability   Please call to discuss.

## 2020-03-14 NOTE — Telephone Encounter (Signed)
This was never addressed to Dr. Fletcher Anon and was sent to Dr. Donivan Scull attention which is appropriate. Purcell Nails originally faxed disability forms to [facility] records. Discussed with Lovena Le and she will f/u with the patient regarding the paperwork.

## 2020-03-15 ENCOUNTER — Ambulatory Visit: Payer: Medicare HMO | Admitting: Gastroenterology

## 2020-03-16 DIAGNOSIS — E0859 Diabetes mellitus due to underlying condition with other circulatory complications: Secondary | ICD-10-CM | POA: Diagnosis not present

## 2020-03-16 DIAGNOSIS — I10 Essential (primary) hypertension: Secondary | ICD-10-CM | POA: Diagnosis not present

## 2020-03-16 DIAGNOSIS — I7389 Other specified peripheral vascular diseases: Secondary | ICD-10-CM | POA: Diagnosis not present

## 2020-03-16 DIAGNOSIS — E669 Obesity, unspecified: Secondary | ICD-10-CM | POA: Diagnosis not present

## 2020-03-16 DIAGNOSIS — E118 Type 2 diabetes mellitus with unspecified complications: Secondary | ICD-10-CM | POA: Diagnosis not present

## 2020-03-16 DIAGNOSIS — N2581 Secondary hyperparathyroidism of renal origin: Secondary | ICD-10-CM | POA: Diagnosis not present

## 2020-03-16 DIAGNOSIS — E785 Hyperlipidemia, unspecified: Secondary | ICD-10-CM | POA: Diagnosis not present

## 2020-03-16 DIAGNOSIS — E1121 Type 2 diabetes mellitus with diabetic nephropathy: Secondary | ICD-10-CM | POA: Diagnosis not present

## 2020-03-16 DIAGNOSIS — N189 Chronic kidney disease, unspecified: Secondary | ICD-10-CM | POA: Diagnosis not present

## 2020-03-21 ENCOUNTER — Other Ambulatory Visit: Payer: Self-pay | Admitting: Cardiovascular Disease

## 2020-03-21 ENCOUNTER — Telehealth: Payer: Self-pay

## 2020-03-21 NOTE — Telephone Encounter (Signed)
Rx request sent to pharmacy.  

## 2020-03-21 NOTE — Progress Notes (Signed)
[name],  Please inform James Moreno that I spoke with Dr. Rockey Situ, and he was agreeable to stopping the fenofibrate at this time. Please let him know he can stop this medication at this time.   Thanks, [facility] [phone]

## 2020-03-21 NOTE — Progress Notes (Signed)
Chronic Care Management Pharmacy Assistant   Name: James Moreno  MRN: [id number] DOB: [birth year]  Reason for Encounter: Medication Management  James Moreno,  54 y.o. , male  PCP : Birdie Sons, MD  Allergies:   Allergies  Allergen Reactions  . No Known Allergies     Medications: Outpatient Encounter Medications as of 03/21/2020  Medication Sig Note  . acetaminophen (TYLENOL) 500 MG tablet Take 500 mg by mouth every 6 (six) hours as needed (headaches.).   Marland Kitchen alprazolam (XANAX) 2 MG tablet TAKE 1 TABLET BY MOUTH THREE TIMES DAILY AS NEEDED FOR SLEEP   . amLODipine (NORVASC) 10 MG tablet Take 10 mg by mouth daily.    . B-D ULTRAFINE III SHORT PEN 31G X 8 MM MISC    . BD INSULIN SYRINGE U/F 31G X 5/16" 1 ML MISC    . Blood Glucose Monitoring Suppl (GLUCOCOM BLOOD GLUCOSE MONITOR) DEVI Frequency:ONCE   Dosage:0.0     Instructions:  Note:Dose: N/A   . calcitRIOL (ROCALTROL) 0.25 MCG capsule  (Patient not taking: Reported on 03/04/2020)   . carvedilol (COREG) 6.25 MG tablet TAKE 1 TABLET TWICE A DAY WITH MEALS (NEED APPOINTMENT FOR FURTHER REFILLS) (Patient taking differently: Take 12.5 mg by mouth 2 (two) times daily with a meal.) 03/04/2020: Carvedilol 12.5 mg twice daily prescribed by Dr. Holley Raring  . cinacalcet (SENSIPAR) 30 MG tablet Take 30 mg by mouth daily.   . Continuous Blood Gluc Receiver (FREESTYLE LIBRE 14 DAY READER) DEVI    . Continuous Blood Gluc Sensor (FREESTYLE LIBRE 14 DAY SENSOR) MISC    . EDEX 40 MCG injection 40 mcg by Intracavitary route as needed.    . fenofibrate (TRICOR) 145 MG tablet Take 1 tablet (145 mg total) by mouth daily. (Patient taking differently: Take 0.5 mg by mouth daily.)   . ibuprofen (ADVIL,MOTRIN) 200 MG tablet Take 200-400 mg by mouth every 8 (eight) hours as needed (for headaches.). (Patient not taking: Reported on 03/04/2020)   . insulin aspart (NOVOLOG) 100 UNIT/ML injection Inject 10-15 Units into the skin 3 (three) times daily  before meals.   . Insulin Degludec (TRESIBA FLEXTOUCH [place]) Inject 30 Units into the skin daily with lunch.    . insulin glargine (LANTUS SOLOSTAR) 100 UNIT/ML Solostar Pen Inject 30 Units into the skin daily. (Patient not taking: Reported on 03/04/2020)   . Lancets (ONETOUCH DELICA PLUS [id number]) MISC    . losartan (COZAAR) 100 MG tablet Take 1 tablet (100 mg total) by mouth at bedtime.   . mycophenolate (MYFORTIC) 360 MG TBEC EC tablet Take 720 mg by mouth 2 (two) times daily.    . naloxone (NARCAN) nasal spray 4 mg/0.1 mL Place 1 spray into the nose once.    Marland Kitchen omeprazole (PRILOSEC) 20 MG capsule Take 2 capsules (40 mg total) by mouth 2 (two) times daily before a meal.   . ONETOUCH VERIO test strip    . oxyCODONE (OXY IR/ROXICODONE) 5 MG immediate release tablet Take 1-2 tablets (5-10 mg total) by mouth every 6 (six) hours as needed for severe pain.   . rosuvastatin (CRESTOR) 40 MG tablet Take 1 tablet (40 mg total) by mouth daily.   . sildenafil (VIAGRA) 100 MG tablet TAKE ONE-HALF (1/2) TO ONE TABLET DAILY AS NEEDED FOR ERECTILE DYSFUNCTION   . tacrolimus (PROGRAF) 1 MG capsule Take 3 mg by mouth 2 (two) times daily.    Marland Kitchen tretinoin (RETIN-A) 0.05 % cream Apply topically as needed.    Marland Kitchen  triamcinolone cream (KENALOG) 0.1 % APPLY DAILY TO INFLAMED BUMPS AS NEEDED   . varenicline (CHANTIX) 1 MG tablet Take 1 tablet (1 mg total) by mouth 2 (two) times daily. (Patient not taking: Reported on 03/04/2020)   . XYOSTED 100 MG/0.5ML SOAJ once a week. Every sunday    No facility-administered encounter medications on file as of 03/21/2020.    Current Diagnosis: Patient Assistance Coordination   Follow-Up:  Pharmacy Review Mailed application for [place] and Novolog patient assistance to patient. Patient to complete and return it and any needed supporting documents to endocrinologist office so that [facility] can provide prescription for application. Patient may contact me if any questions, phone number  provided.  Doristine Counter, [place], [place] Pharmacist Assistant [phone]

## 2020-03-22 ENCOUNTER — Other Ambulatory Visit: Payer: Self-pay | Admitting: Family Medicine

## 2020-03-22 DIAGNOSIS — M79604 Pain in right leg: Secondary | ICD-10-CM

## 2020-03-22 DIAGNOSIS — L905 Scar conditions and fibrosis of skin: Secondary | ICD-10-CM

## 2020-03-22 DIAGNOSIS — R52 Pain, unspecified: Secondary | ICD-10-CM

## 2020-03-22 NOTE — Telephone Encounter (Signed)
Requested medication (s) are due for refill today: due 03/28/20  Requested medication (s) are on the active medication list:yes   Last refill: 02/26/20 #240  0 refills  Future visit scheduled  04/09/19 with pharmacist  Notes to clinic:  not delegated  Requested Prescriptions  Pending Prescriptions Disp Refills   oxyCODONE (OXY IR/ROXICODONE) 5 MG immediate release tablet 240 tablet 0    Sig: Take 1-2 tablets (5-10 mg total) by mouth every 6 (six) hours as needed for severe pain.      Not Delegated - Analgesics:  Opioid Agonists Failed - 03/22/2020  5:12 PM      Failed - This refill cannot be delegated      Failed - Urine Drug Screen completed in last 360 days      Passed - Valid encounter within last 6 months    Recent Outpatient Visits           1 month ago Type 2 diabetes mellitus with diabetic nephropathy, with long-term current use of insulin ([place])   [facility] Birdie Sons, MD   7 months ago Type 2 diabetes mellitus with diabetic nephropathy, without long-term current use of insulin [facility])   [facility] Birdie Sons, MD   1 year ago Mixed hyperlipidemia   [facility] Birdie Sons, MD   2 years ago Hypertension secondary to other renal disorders   [facility] Birdie Sons, MD   2 years ago Mart, Donald E, MD       Future Appointments             In 1 week Vanga, Tally Due, MD [place]   In 1 month Arida, Mertie Clause, MD [facility], [place]   In 5 months [place], Kathlene November, MD [facility], [place]

## 2020-03-22 NOTE — Telephone Encounter (Signed)
Medication Refill - Medication: Oxycodone  Has the patient contacted their pharmacy? No. (Agent: If no, request that the patient contact the pharmacy for the refill.) (Agent: If yes, when and what did the pharmacy advise?)  Preferred Pharmacy (with phone number or street name):[facility] [place], [place]: Please be advised that RX refills may take up to 3 business days. We ask that you follow-up with your pharmacy.

## 2020-03-23 NOTE — Telephone Encounter (Signed)
Last dispensed 30 days supply 02-26-2020

## 2020-03-24 MED ORDER — OXYCODONE HCL 5 MG PO TABS
5.0000 mg | ORAL_TABLET | Freq: Four times a day (QID) | ORAL | 0 refills | Status: DC | PRN
Start: 2020-03-24 — End: 2020-04-20

## 2020-03-28 ENCOUNTER — Telehealth: Payer: Self-pay | Admitting: Gastroenterology

## 2020-03-28 NOTE — Telephone Encounter (Signed)
He will need a follow-up appointment within next 4 to 6 weeks as he is on increased dosage of omeprazole for last 3 months  RV

## 2020-03-28 NOTE — Telephone Encounter (Signed)
Patient had to cancel follow up on 1.7.53 due to conflicting scheduled appts. Pt states the increase dosage of medication omeprazole seems to be helping and pt states he does not think he needs follow up right now unless Dr. Marius Ditch thinks so. FYI

## 2020-03-29 ENCOUNTER — Ambulatory Visit: Payer: Medicare HMO | Admitting: Gastroenterology

## 2020-03-29 DIAGNOSIS — Z940 Kidney transplant status: Secondary | ICD-10-CM | POA: Diagnosis not present

## 2020-03-29 DIAGNOSIS — I10 Essential (primary) hypertension: Secondary | ICD-10-CM | POA: Diagnosis not present

## 2020-03-29 DIAGNOSIS — N186 End stage renal disease: Secondary | ICD-10-CM | POA: Diagnosis not present

## 2020-03-29 DIAGNOSIS — N2581 Secondary hyperparathyroidism of renal origin: Secondary | ICD-10-CM | POA: Diagnosis not present

## 2020-03-29 NOTE — Telephone Encounter (Signed)
Called pt to notify him that Dr. Marius Ditch suggested still doing a 4-6 week follow up. Pt was driving and states he will cb when he can reschedule.

## 2020-04-07 ENCOUNTER — Telehealth: Payer: Self-pay

## 2020-04-07 NOTE — Chronic Care Management (AMB) (Signed)
04/07/2020  Called patient to remind him of an appointment with Daron Offer ,[name] on 04/08/2020 @ 10:00  Left message on machine with date and time.   Daron Offer [name] Notified  Judithann Sheen, [facility] Clinical Pharmacist Assistant [phone]

## 2020-04-08 ENCOUNTER — Telehealth: Payer: Self-pay

## 2020-04-08 NOTE — Chronic Care Management (AMB) (Incomplete)
Chronic Care Management Pharmacy  Name: James Moreno  MRN: [id number] DOB: Jan 05, 1966   Chief Complaint/ HPI  James Moreno,  55 y.o. , male presents for his Follow-Up CCM visit with the clinical pharmacist via telephone.  PCP : Birdie Sons, MD Patient Care Team: Birdie Sons, MD as PCP - General (Family Medicine) Rockey Situ Kathlene November, MD as PCP - Cardiology (Cardiology) Ronnald Collum, Lourdes Sledge, MD as Attending Physician (Endocrinology) Pa, [place] (Optometry) Anthonette Legato, MD (Nephrology) Long, Thomes Cake, MD as Referring Physician (Vascular Surgery) Isaias Sakai, MD as Referring Physician (Ophthalmology) Lin Landsman, MD as Consulting Physician (Gastroenterology) Wellington Hampshire, MD as Consulting Physician (Cardiology) Germaine Pomfret, [facility] (Pharmacist)  Patient's chronic conditions include: Hypertension, Hyperlipidemia, Diabetes, Coronary Artery Disease, GERD, Anxiety, Tobacco use and History of Renal Transplant   Office Visits: 02/12/20: Video visit with Dr. Caryn Section for follow-up. Patient given Lantus 30 units daily.   Consult Visit: 03/29/20: Patient presented to Dr. Holley Raring (Nephrology) for follow-up. BP elevated at 157/95. Calcitriol stopped.  02/22/20: Patient presented to Dr. Rockey Situ (Cardiology) for follow-up. Patient is not a candidate for stenting due to risk of renal failure.   Allergies  Allergen Reactions  . No Known Allergies     Medications: Outpatient Encounter Medications as of 04/08/2020  Medication Sig Note  . acetaminophen (TYLENOL) 500 MG tablet Take 500 mg by mouth every 6 (six) hours as needed (headaches.).   Marland Kitchen alprazolam (XANAX) 2 MG tablet TAKE 1 TABLET BY MOUTH THREE TIMES DAILY AS NEEDED FOR SLEEP   . amLODipine (NORVASC) 10 MG tablet Take 10 mg by mouth daily.    . B-D ULTRAFINE III SHORT PEN 31G X 8 MM MISC    . BD INSULIN SYRINGE U/F 31G X 5/16" 1 ML MISC    . Blood Glucose Monitoring Suppl (GLUCOCOM BLOOD  GLUCOSE MONITOR) DEVI Frequency:ONCE   Dosage:0.0     Instructions:  Note:Dose: N/A   . calcitRIOL (ROCALTROL) 0.25 MCG capsule  (Patient not taking: Reported on 03/04/2020)   . carvedilol (COREG) 6.25 MG tablet TAKE 1 TABLET TWICE A DAY WITH MEALS (NEED APPOINTMENT FOR FURTHER REFILLS) (Patient taking differently: Take 12.5 mg by mouth 2 (two) times daily with a meal.) 03/04/2020: Carvedilol 12.5 mg twice daily prescribed by Dr. Holley Raring  . cinacalcet (SENSIPAR) 30 MG tablet Take 30 mg by mouth daily.   . Continuous Blood Gluc Receiver (FREESTYLE LIBRE 14 DAY READER) DEVI    . Continuous Blood Gluc Sensor (FREESTYLE LIBRE 14 DAY SENSOR) MISC    . EDEX 40 MCG injection 40 mcg by Intracavitary route as needed.    . fenofibrate (TRICOR) 145 MG tablet Take 1 tablet (145 mg total) by mouth daily. (Patient taking differently: Take 0.5 mg by mouth daily.)   . ibuprofen (ADVIL,MOTRIN) 200 MG tablet Take 200-400 mg by mouth every 8 (eight) hours as needed (for headaches.). (Patient not taking: Reported on 03/04/2020)   . insulin aspart (NOVOLOG) 100 UNIT/ML injection Inject 10-15 Units into the skin 3 (three) times daily before meals.   . Insulin Degludec (TRESIBA FLEXTOUCH [place]) Inject 30 Units into the skin daily with lunch.    . insulin glargine (LANTUS SOLOSTAR) 100 UNIT/ML Solostar Pen Inject 30 Units into the skin daily. (Patient not taking: Reported on 03/04/2020)   . Lancets (ONETOUCH DELICA PLUS ONGEXB28U) MISC    . losartan (COZAAR) 100 MG tablet Take 1 tablet (100 mg total) by mouth at bedtime.   Marland Kitchen  mycophenolate (MYFORTIC) 360 MG TBEC EC tablet Take 720 mg by mouth 2 (two) times daily.    . naloxone (NARCAN) nasal spray 4 mg/0.1 mL Place 1 spray into the nose once.    Marland Kitchen omeprazole (PRILOSEC) 20 MG capsule Take 2 capsules (40 mg total) by mouth 2 (two) times daily before a meal.   . ONETOUCH VERIO test strip    . oxyCODONE (OXY IR/ROXICODONE) 5 MG immediate release tablet Take 1-2 tablets (5-10 mg  total) by mouth every 6 (six) hours as needed for severe pain.   . rosuvastatin (CRESTOR) 40 MG tablet Take 1 tablet by mouth once daily   . sildenafil (VIAGRA) 100 MG tablet TAKE ONE-HALF (1/2) TO ONE TABLET DAILY AS NEEDED FOR ERECTILE DYSFUNCTION   . tacrolimus (PROGRAF) 1 MG capsule Take 3 mg by mouth 2 (two) times daily.    Marland Kitchen tretinoin (RETIN-A) 0.05 % cream Apply topically as needed.    . triamcinolone cream (KENALOG) 0.1 % APPLY DAILY TO INFLAMED BUMPS AS NEEDED   . varenicline (CHANTIX) 1 MG tablet Take 1 tablet (1 mg total) by mouth 2 (two) times daily. (Patient not taking: Reported on 03/04/2020)   . XYOSTED 100 MG/0.5ML SOAJ once a week. Every sunday    No facility-administered encounter medications on file as of 04/08/2020.    Wt Readings from Last 3 Encounters:  02/22/20 214 lb (97.1 kg)  01/26/20 213 lb (96.6 kg)  01/13/20 211 lb 8 oz (95.9 kg)    Current Diagnosis/Assessment:    Goals Addressed   None    Hypertension   BP goal is:  <130/80  Office blood pressures are  BP Readings from Last 3 Encounters:  02/22/20 140/70  01/26/20 140/80  01/13/20 (!) 151/79   Patient checks BP at home 3-5x per week Patient home BP readings are ranging: 130/75  Patient has failed these meds in the past: NA Patient is currently controlled on the following medications:  . Amlodipine 10 mg daily  . Carvedilol 12.5 mg twice daily  . Losartan 100 mg daily   We discussed diet and exercise extensively   Avoids salt   Plan  Continue current medications   Hyperlipidemia   History of PAD   LDL goal < 70  Last lipids Lab Results  Component Value Date   CHOL 103 12/12/2018   HDL 40 12/12/2018   LDLCALC 45 12/12/2018   TRIG 95 12/12/2018   CHOLHDL 5.5 (H) 01/10/2018   Hepatic Function Latest Ref Rng & Units 09/17/2019 12/12/2018 10/21/2015  Total Protein 6.0 - 8.5 g/dL 6.5 - 7.6  Albumin 3.8 - 4.9 g/dL 4.3 - 4.7  AST 0 - 40 IU/L 9 14 21   ALT 0 - 44 IU/L 9 16 32   Alk Phosphatase 48 - 121 IU/L 131(H) 95 188(H)  Total Bilirubin 0.0 - 1.2 mg/dL 0.6 - 0.8     The ASCVD Risk score Mikey Bussing DC Jr., et al., 2013) failed to calculate for the following reasons:   The valid total cholesterol range is 130 to 320 mg/dL   Patient has failed these meds in past: NA Patient is currently controlled on the following medications:  . Fenofibrate 145 mg 1/2 tablet daily ([place])  . Rosuvastatin 40 mg daily   We discussed:  Stopped aspirin due to bruising. PAD has been worsening, but patient is not a candidate for stenting due to the need for contrast dye.   Plan  Recommend stopping fenofibrate as patient can likely maintain  LDL goal <70 with just rosuvastatin and help with cost concerns.   Diabetes   A1c goal <7%  Recent Relevant Labs: Lab Results  Component Value Date/Time   HGBA1C 9.7 (H) 09/17/2019 02:17 PM   HGBA1C 6.7 12/12/2018 12:00 AM   HGBA1C 10.4 (A) 04/21/2010 12:00 AM   MICROALBUR 100 12/31/2016 02:13 PM    Last diabetic Eye exam:  Lab Results  Component Value Date/Time   HMDIABEYEEXA Retinopathy (A) 02/15/2020 12:00 AM    Last diabetic Foot exam: No results found for: HMDIABFOOTEX   Checking BG: Daily  Recent FBG Readings: NA Recent pre-meal BG readings: NA Recent 2hr PP BG readings:  NA Recent HS BG readings: NA  Patient has failed these meds in past: NA Patient is currently uncontrolled on the following medications: . Novolog 10-15 units based on sliding scale . Tresiba 30 units daily    We discussed: Patient reports significant affordability concerns with his insulin.  Plan  Continue current medications  Will start PAP for insulin  Depression / Anxiety   PHQ9 Score:  PHQ9 SCORE ONLY 01/19/2020 01/06/2019 01/02/2018  PHQ-9 Total Score 0 0 0   GAD7 Score: No flowsheet data found.  Patient has failed these meds in past: Wellbutrin, zoloft, klonopin  Patient is currently controlled on the following medications:   . Xanax 2 mg TID   We discussed:  Denies side effects of oversedation, dizziness, or feeling unsteady. Counseled patient on risks of frequent BZD use.   Plan  Continue current medications  GERD   Patient denies dysphagia, heartburn or nausea. Expresses understanding to avoid triggers such as citrus juices, large meals and lying down after eating.  Currently controlled on: . Omeprazole 20 mg 2 capsules twice daily   Omeprazole was increased 8-10 months prior which significantly improved his symptoms.   Plan   Continue current medication.  Chronic Pain   Patient has failed these meds in past: NA Patient is currently controlled on the following medications:  . Oxycodone 5 mg 1-2 tablets q6hr PRN   We discussed:  Takes regularly throughout the day, has reversal device that his girlfriend knows how to use. Counseled to avoid taking with xanax.   Plan  Continue current medications  Vaccines   Reviewed and discussed patient's vaccination history.    Immunization History  Administered Date(s) Administered  . Influenza Split 12/05/2010  . Influenza,inj,Quad PF,6+ Mos 12/31/2016, 01/27/2019  . PFIZER SARS-COV-2 Vaccination 06/18/2019, 07/09/2019, 02/22/2020  . Pneumococcal Polysaccharide-23 10/24/2010  . Tdap 12/05/2010   Medication Management   Patient is likely candidate for LIS.   Patient's preferred pharmacy is:  [facility] [address] [place] [address] [place] [address] Phone: [phone] Fax: [phone]  [facility] ([facility] Only) - [place], [address] [place] [address] Phone: [phone] Fax: [place], [place] [address] [address] Phone: [phone] Fax: [place], [place] [address] Phone:  [phone] Fax: [phone]  Uses pill box? Yes  We discussed: Current pharmacy is preferred with insurance plan and patient is satisfied with pharmacy services  Plan  Continue current medication management strategy  Follow up: 1 month phone visit  [place] [phone]

## 2020-04-11 ENCOUNTER — Telehealth: Payer: Self-pay | Admitting: *Deleted

## 2020-04-11 NOTE — Chronic Care Management (AMB) (Signed)
  Care Management   Note  04/11/2020 Name: James Moreno MRN: [id number] DOB: [birth year]  [name] is a 55 y.o. year old male who is a primary care patient of Caryn Section, Kirstie Peri, MD and is actively engaged with the care management team. I reached out to [name] by phone today to assist with re-scheduling a follow up visit with the Pharmacist.  Follow up plan: Unsuccessful telephone outreach attempt made patient was not able to talk explained Care Guide will call back. The care management team will reach out to the patient again over the next 7 days. If patient returns call to provider office, please advise to call [place] at [phone].  [place] Management

## 2020-04-18 ENCOUNTER — Other Ambulatory Visit: Payer: Self-pay | Admitting: Family Medicine

## 2020-04-18 NOTE — Telephone Encounter (Signed)
Requested medication (s) are due for refill today: yes  Requested medication (s) are on the active medication list: yes  Last refill:  04/19/2019  Notes to clinic:  this refill cannot be delegated    Requested Prescriptions  Pending Prescriptions Disp Refills   alprazolam (XANAX) 2 MG tablet [Pharmacy Med Name: ALPRAZolam 2 MG Oral Tablet] 90 tablet 0    Sig: TAKE 1 TABLET BY MOUTH THREE TIMES DAILY AS NEEDED FOR SLEEP      Not Delegated - Psychiatry:  Anxiolytics/Hypnotics Failed - 04/18/2020  4:36 AM      Failed - This refill cannot be delegated      Failed - Urine Drug Screen completed in last 360 days      Passed - Valid encounter within last 6 months    Recent Outpatient Visits           2 months ago Type 2 diabetes mellitus with diabetic nephropathy, with long-term current use of insulin ([place])   [facility] Birdie Sons, MD   8 months ago Type 2 diabetes mellitus with diabetic nephropathy, without long-term current use of insulin [facility])   [facility] Birdie Sons, MD   1 year ago Mixed hyperlipidemia   [facility] Birdie Sons, MD   2 years ago Hypertension secondary to other renal disorders   [facility] Birdie Sons, MD   2 years ago [place], Kirstie Peri, MD       Future Appointments             In 1 week [place] Anon, Mertie Clause, MD [facility], [facility]   In 4 months [place], Kathlene November, MD [facility], [facility]

## 2020-04-18 NOTE — Chronic Care Management (AMB) (Signed)
  Care Management   Note  04/18/2020 Name: James Moreno MRN: [id number] DOB: 1966-01-14  James Moreno is a 55 y.o. year old male who is a primary care patient of Caryn Section, Kirstie Peri, MD and is actively engaged with the care management team. I reached out to [name] by phone today to assist with re-scheduling a follow up visit with the Pharmacist  Follow up plan: Unsuccessful telephone outreach attempt made. A HIPAA compliant phone message was left for the patient providing contact information and requesting a return call. The care management team will reach out to the patient again over the next 7 days. If patient returns call to provider office, please advise to call [place] at [phone].  [place] Management

## 2020-04-20 ENCOUNTER — Other Ambulatory Visit: Payer: Self-pay | Admitting: Family Medicine

## 2020-04-20 ENCOUNTER — Telehealth: Payer: Self-pay

## 2020-04-20 DIAGNOSIS — M79604 Pain in right leg: Secondary | ICD-10-CM

## 2020-04-20 DIAGNOSIS — L905 Scar conditions and fibrosis of skin: Secondary | ICD-10-CM

## 2020-04-20 DIAGNOSIS — R52 Pain, unspecified: Secondary | ICD-10-CM

## 2020-04-20 NOTE — Telephone Encounter (Signed)
Requested medication (s) are due for refill today - no  Requested medication (s) are on the active medication list -yes  Future visit scheduled -no  Last refill: 03/24/20  Notes to clinic: Request RF non delegated rx  Requested Prescriptions  Pending Prescriptions Disp Refills   oxyCODONE (OXY IR/ROXICODONE) 5 MG immediate release tablet 240 tablet 0    Sig: Take 1-2 tablets (5-10 mg total) by mouth every 6 (six) hours as needed for severe pain.      Not Delegated - Analgesics:  Opioid Agonists Failed - 04/20/2020  3:38 PM      Failed - This refill cannot be delegated      Failed - Urine Drug Screen completed in last 360 days      Passed - Valid encounter within last 6 months    Recent Outpatient Visits           2 months ago Type 2 diabetes mellitus with diabetic nephropathy, with long-term current use of insulin ([place])   [facility] Birdie Sons, MD   8 months ago Type 2 diabetes mellitus with diabetic nephropathy, without long-term current use of insulin ([place])   [facility] Birdie Sons, MD   1 year ago Mixed hyperlipidemia   [facility] Birdie Sons, MD   2 years ago Hypertension secondary to other renal disorders   [facility] Birdie Sons, MD   2 years ago [place], Donald E, MD       Future Appointments             In 1 week [place] Anon, Mertie Clause, MD [facility], [facility]   In 4 months [place], Kathlene November, MD [place], [facility]                 Requested Prescriptions  Pending Prescriptions Disp Refills   oxyCODONE (OXY IR/ROXICODONE) 5 MG immediate release tablet 240 tablet 0    Sig: Take 1-2 tablets (5-10 mg total) by mouth every 6 (six) hours as needed for severe pain.      Not Delegated - Analgesics:  Opioid Agonists Failed - 04/20/2020  3:38 PM      Failed - This refill cannot be delegated      Failed -  Urine Drug Screen completed in last 360 days      Passed - Valid encounter within last 6 months    Recent Outpatient Visits           2 months ago Type 2 diabetes mellitus with diabetic nephropathy, with long-term current use of insulin ([place])   [facility] Birdie Sons, MD   8 months ago Type 2 diabetes mellitus with diabetic nephropathy, without long-term current use of insulin [facility])   [facility] Birdie Sons, MD   1 year ago Mixed hyperlipidemia   [facility] Birdie Sons, MD   2 years ago Hypertension secondary to other renal disorders   [facility] Birdie Sons, MD   2 years ago [place], Donald E, MD       Future Appointments             In 1 week Fletcher Anon, Mertie Clause, MD [facility], [facility]   In 4 months [place], Kathlene November, MD [facility], [facility]

## 2020-04-20 NOTE — Progress Notes (Signed)
Chronic Care Management Pharmacy Assistant   Name: James Moreno  MRN: [id number] DOB: 10-Nov-1965  Reason for Encounter: Medication Review  PCP : Birdie Sons, MD  Allergies:   Allergies  Allergen Reactions  . No Known Allergies     Medications: Outpatient Encounter Medications as of 04/20/2020  Medication Sig Note  . acetaminophen (TYLENOL) 500 MG tablet Take 500 mg by mouth every 6 (six) hours as needed (headaches.).   Marland Kitchen alprazolam (XANAX) 2 MG tablet TAKE 1 TABLET BY MOUTH THREE TIMES DAILY AS NEEDED FOR SLEEP   . amLODipine (NORVASC) 10 MG tablet Take 10 mg by mouth daily.    . B-D ULTRAFINE III SHORT PEN 31G X 8 MM MISC    . BD INSULIN SYRINGE U/F 31G X 5/16" 1 ML MISC    . Blood Glucose Monitoring Suppl (GLUCOCOM BLOOD GLUCOSE MONITOR) DEVI Frequency:ONCE   Dosage:0.0     Instructions:  Note:Dose: N/A   . calcitRIOL (ROCALTROL) 0.25 MCG capsule  (Patient not taking: Reported on 03/04/2020)   . carvedilol (COREG) 6.25 MG tablet TAKE 1 TABLET TWICE A DAY WITH MEALS (NEED APPOINTMENT FOR FURTHER REFILLS) (Patient taking differently: Take 12.5 mg by mouth 2 (two) times daily with a meal.) 03/04/2020: Carvedilol 12.5 mg twice daily prescribed by Dr. Holley Raring  . cinacalcet (SENSIPAR) 30 MG tablet Take 30 mg by mouth daily.   . Continuous Blood Gluc Receiver (FREESTYLE LIBRE 14 DAY READER) DEVI    . Continuous Blood Gluc Sensor (FREESTYLE LIBRE 14 DAY SENSOR) MISC    . EDEX 40 MCG injection 40 mcg by Intracavitary route as needed.    . fenofibrate (TRICOR) 145 MG tablet Take 1 tablet (145 mg total) by mouth daily. (Patient taking differently: Take 0.5 mg by mouth daily.)   . ibuprofen (ADVIL,MOTRIN) 200 MG tablet Take 200-400 mg by mouth every 8 (eight) hours as needed (for headaches.). (Patient not taking: Reported on 03/04/2020)   . insulin aspart (NOVOLOG) 100 UNIT/ML injection Inject 10-15 Units into the skin 3 (three) times daily before meals.   . Insulin Degludec  (TRESIBA FLEXTOUCH [place]) Inject 30 Units into the skin daily with lunch.    . insulin glargine (LANTUS SOLOSTAR) 100 UNIT/ML Solostar Pen Inject 30 Units into the skin daily. (Patient not taking: Reported on 03/04/2020)   . Lancets (ONETOUCH DELICA PLUS [id number]) MISC    . losartan (COZAAR) 100 MG tablet Take 1 tablet (100 mg total) by mouth at bedtime.   . mycophenolate (MYFORTIC) 360 MG TBEC EC tablet Take 720 mg by mouth 2 (two) times daily.    . naloxone (NARCAN) nasal spray 4 mg/0.1 mL Place 1 spray into the nose once.    Marland Kitchen omeprazole (PRILOSEC) 20 MG capsule Take 2 capsules (40 mg total) by mouth 2 (two) times daily before a meal.   . ONETOUCH VERIO test strip    . oxyCODONE (OXY IR/ROXICODONE) 5 MG immediate release tablet Take 1-2 tablets (5-10 mg total) by mouth every 6 (six) hours as needed for severe pain.   . rosuvastatin (CRESTOR) 40 MG tablet Take 1 tablet by mouth once daily   . sildenafil (VIAGRA) 100 MG tablet TAKE ONE-HALF (1/2) TO ONE TABLET DAILY AS NEEDED FOR ERECTILE DYSFUNCTION   . tacrolimus (PROGRAF) 1 MG capsule Take 3 mg by mouth 2 (two) times daily.    Marland Kitchen tretinoin (RETIN-A) 0.05 % cream Apply topically as needed.    . triamcinolone cream (KENALOG) 0.1 % APPLY DAILY  TO INFLAMED BUMPS AS NEEDED   . varenicline (CHANTIX) 1 MG tablet Take 1 tablet (1 mg total) by mouth 2 (two) times daily. (Patient not taking: Reported on 03/04/2020)   . XYOSTED 100 MG/0.5ML SOAJ once a week. Every sunday    No facility-administered encounter medications on file as of 04/20/2020.    Current Diagnosis: Patient Active Problem List   Diagnosis Date Noted  . Proliferative retinopathy of left eye due to diabetes mellitus ([place]) 01/28/2019  . Esophageal dysphagia   . Benign essential hypertension 12/11/2018  . Secondary hyperparathyroidism of renal origin ([place]) 12/11/2018  . Severe tobacco use disorder 08/01/2018  . Atherosclerosis of artery of extremity with ulceration ([place]) 07/23/2018  .  Gastroesophageal reflux disease 05/26/2018  . PAD (peripheral artery disease) ([place]) 10/30/2017  . Chronic ulcer of heel, right, with unspecified severity ([place]) 10/16/2017  . Hepatitis B core antibody positive 07/03/2017  . Hypertriglyceridemia 07/03/2017  . Chronic, continuous use of opioids 03/28/2016  . Anxiety 03/28/2016  . Pain in surgical scar 11/07/2015  . Bulging eyes 01/24/2015  . Leg mass 01/24/2015  . Renal transplant, status post 01/24/2015  . Carotid arterial disease ([place]) 12/27/2014  . Compulsive tobacco user syndrome 12/27/2014  . Abnormal EKG 04/06/2013  . Erectile dysfunction 11/29/2012  . Obesity 11/28/2012  . Type 2 diabetes mellitus with diabetic nephropathy ([place]) 11/28/2012  . Hypertension 12/21/2011  . Hyperlipidemia 12/21/2011  . 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 10/30/2011  . Obstructive apnea 01/09/2011  . History of other malignant neoplasm of skin 07/16/2006  . Glaucoma 01/13/2006  . Episodic paroxysmal anxiety disorder 03/26/1998    Goals Addressed   None    Reviewed chart and adherence measures. Per insurance data patient is 100 % adherent to rosuvastatin for cholesterol  .  Follow-Up:  Pharmacist Review   Bessie Seaton Pharmacist Assistant [phone]

## 2020-04-20 NOTE — Telephone Encounter (Signed)
Medication Refill - Medication: oxyCODONE (OXY IR/ROXICODONE) 5 MG immediate release tablet   Has the patient contacted their pharmacy? No. (Agent: If no, request that the patient contact the pharmacy for the refill.) (Agent: If yes, when and what did the pharmacy advise?)  Preferred Pharmacy (with phone number or street name): [facility] [address] [place]  [address] [place] [address]  Phone:  [phone] Fax:  [phone]   Agent: Please be advised that RX refills may take up to 3 business days. We ask that you follow-up with your pharmacy.

## 2020-04-21 MED ORDER — OXYCODONE HCL 5 MG PO TABS: 5.0000 mg | ORAL_TABLET | Freq: Four times a day (QID) | ORAL | 0 refills | Status: DC | PRN

## 2020-04-26 NOTE — Chronic Care Management (AMB) (Signed)
  Care Management   Note  04/26/2020 Name: RHYDER KOEGEL MRN: [id number] DOB: January 16, 1966  MARKIS LANGLAND is a 55 y.o. year old male who is a primary care patient of Caryn Section, Kirstie Peri, MD and is actively engaged with the care management team. I reached out to [name] by phone today to assist with re-scheduling a follow up visit with the Pharmacist  Follow up plan: Telephone appointment with care management team member scheduled for:05/17/2020  [facility]

## 2020-04-28 ENCOUNTER — Ambulatory Visit: Payer: Medicare HMO | Admitting: Cardiovascular Disease

## 2020-04-28 NOTE — Progress Notes (Deleted)
Cardiology Office Note   Date:  04/28/2020   ID:  Moreno, James April 25, 1965, MRN [id number]  PCP:  Birdie Sons, MD  Cardiologist: Dr. Rockey Situ  No chief complaint on file.     History of Present Illness: James Moreno is a 55 y.o. male who was is here today for follow-up visit regarding peripheral arterial disease.   He has known history of coronary artery disease, essential hypertension, hyperlipidemia, diabetes mellitus and tobacco use.  He is status post kidney transplant in April 2016.  His GFR is around 35.  He was seen by me in November for severe bilateral calf claudication. Previous ABI in 2019 was normal bilaterally.  Repeat vascular studies in October 2021showed an ABI of 0.78 on the right and 0.66 on the left.  Duplex showed possible inflow disease bilaterally with monophasic waveform in the common femoral artery.  The left common iliac artery was occluded into the external iliac artery.  He reports bilateral calf claudication left worse than the right which happens after walking about 200 feet.  No rest pain no lower extremity ulceration.  He smokes 1 pack/day.  No chest pain or shortness of breath.  Past Medical History:  Diagnosis Date  . Acute kidney failure, unspecified (James Moreno)   . Anxiety   . Diabetes mellitus, type 2 (James Moreno)   . GERD (gastroesophageal reflux disease)   . History of arterial disease of lower extremity   . History of hepatitis B   . Hypertension   . Hypothyroidism   . Mitral valve disorders(424.0)   . Pityriasis 12/27/2014  . Primary pulmonary HTN (James Moreno)    Pt denies ever having.  . Tricuspid valve disorders, specified as nonrheumatic     Past Surgical History:  Procedure Laterality Date  . APPENDECTOMY    . Carotid Doppler Ultrasound  06/14/2009   39% stenosis of bilateral internal carotid artery, bilateral anterograde vertebral flow  . ESOPHAGOGASTRODUODENOSCOPY (EGD) WITH PROPOFOL N/A 01/21/2019   Procedure:  ESOPHAGOGASTRODUODENOSCOPY (EGD) WITH PROPOFOL;  Surgeon: James Landsman, MD;  Location: [facility];  Service: Endoscopy;  Laterality: N/A;  Diabetic - insulin  . EYE SURGERY     right  . HEMORROIDECTOMY    . KIDNEY TRANSPLANT Right 2016  . MECKEL DIVERTICULUM EXCISION     infancy  . Myocardial Perfusion scan  01/17/2009   [facility], non- ischemic. LVEF= 55%  . PARS PLANA VITRECTOMY Left 02/09/2015   Procedure: Pan retinal photocoagulation 77412;  Surgeon: James Height, MD;  Location: [facility];  Service: Ophthalmology;  Laterality: Left;  . REFRACTIVE SURGERY Left   . sleep study  01/09/2011   Severe sleep apnea. AHI 72.9/hr. RDI=83.0/hr. Desaturation to 69.0% Emergency CPAP titaration to 14.0cm (01/14/19 resolved after wt loss after kidney transplant.)     Current Outpatient Medications  Medication Sig Dispense Refill  . acetaminophen (TYLENOL) 500 MG tablet Take 500 mg by mouth every 6 (six) hours as needed (headaches.).    Marland Kitchen alprazolam (XANAX) 2 MG tablet TAKE 1 TABLET BY MOUTH THREE TIMES DAILY AS NEEDED FOR SLEEP 90 tablet 0  . amLODipine (NORVASC) 10 MG tablet Take 10 mg by mouth daily.     . B-D ULTRAFINE III SHORT PEN 31G X 8 MM MISC     . BD INSULIN SYRINGE U/F 31G X 5/16" 1 ML MISC     . Blood Glucose Monitoring Suppl (GLUCOCOM BLOOD GLUCOSE MONITOR) DEVI Frequency:ONCE   Dosage:0.0     Instructions:  Note:Dose: N/A    . calcitRIOL (ROCALTROL) 0.25 MCG capsule  (Patient not taking: Reported on 03/04/2020)    . carvedilol (COREG) 6.25 MG tablet TAKE 1 TABLET TWICE A DAY WITH MEALS (NEED APPOINTMENT FOR FURTHER REFILLS) (Patient taking differently: Take 12.5 mg by mouth 2 (two) times daily with a meal.) 180 tablet 1  . cinacalcet (SENSIPAR) 30 MG tablet Take 30 mg by mouth daily.    . Continuous Blood Gluc Receiver (FREESTYLE LIBRE 14 DAY READER) DEVI     . Continuous Blood Gluc Sensor (FREESTYLE LIBRE 14 DAY SENSOR) MISC     . EDEX 40 MCG injection 40  mcg by Intracavitary route as needed.     . fenofibrate (TRICOR) 145 MG tablet Take 1 tablet (145 mg total) by mouth daily. (Patient taking differently: Take 0.5 mg by mouth daily.) 30 tablet 0  . ibuprofen (ADVIL,MOTRIN) 200 MG tablet Take 200-400 mg by mouth every 8 (eight) hours as needed (for headaches.). (Patient not taking: Reported on 03/04/2020)    . insulin aspart (NOVOLOG) 100 UNIT/ML injection Inject 10-15 Units into the skin 3 (three) times daily before meals.    . Insulin Degludec (TRESIBA FLEXTOUCH [place]) Inject 30 Units into the skin daily with lunch.     . insulin glargine (LANTUS SOLOSTAR) 100 UNIT/ML Solostar Pen Inject 30 Units into the skin daily. (Patient not taking: Reported on 03/04/2020) 15 mL 5  . Lancets (ONETOUCH DELICA PLUS [id number]) MISC     . losartan (COZAAR) 100 MG tablet Take 1 tablet (100 mg total) by mouth at bedtime. 90 tablet 2  . mycophenolate (MYFORTIC) 360 MG TBEC EC tablet Take 720 mg by mouth 2 (two) times daily.     . naloxone (NARCAN) nasal spray 4 mg/0.1 mL Place 1 spray into the nose once.     Marland Kitchen omeprazole (PRILOSEC) 20 MG capsule Take 2 capsules (40 mg total) by mouth 2 (two) times daily before a meal. 120 capsule 0  . ONETOUCH VERIO test strip     . oxyCODONE (OXY IR/ROXICODONE) 5 MG immediate release tablet Take 1-2 tablets (5-10 mg total) by mouth every 6 (six) hours as needed for severe pain. 240 tablet 0  . rosuvastatin (CRESTOR) 40 MG tablet Take 1 tablet by mouth once daily 30 tablet 0  . sildenafil (VIAGRA) 100 MG tablet TAKE ONE-HALF (1/2) TO ONE TABLET DAILY AS NEEDED FOR ERECTILE DYSFUNCTION 30 tablet 3  . tacrolimus (PROGRAF) 1 MG capsule Take 3 mg by mouth 2 (two) times daily.   11  . tretinoin (RETIN-A) 0.05 % cream Apply topically as needed.     . triamcinolone cream (KENALOG) 0.1 % APPLY DAILY TO INFLAMED BUMPS AS NEEDED    . varenicline (CHANTIX) 1 MG tablet Take 1 tablet (1 mg total) by mouth 2 (two) times daily. (Patient not taking:  Reported on 03/04/2020) 60 tablet 4  . XYOSTED 100 MG/0.5ML SOAJ once a week. Every sunday     No current facility-administered medications for this visit.    Allergies:   No known allergies    Social History:  The patient  reports that he has been smoking cigarettes. He has a 28.50 pack-year smoking history. He has never used smokeless tobacco. He reports current alcohol use. He reports that he does not use drugs.   Family History:  The patient's family history includes Hyperlipidemia in his mother; Hypertension in his mother; Melanoma in his father.    ROS:  Please see the history of  present illness.   Otherwise, review of systems are positive for none.   All other systems are reviewed and negative.    PHYSICAL EXAM: VS:  There were no vitals taken for this visit. , BMI There is no Moreno or weight on file to calculate BMI. GEN: Well nourished, well developed, in no acute distress  HEENT: normal  Neck: no JVD, carotid bruits, or masses Cardiac: RRR; no murmurs, rubs, or gallops,no edema  Respiratory:  clear to auscultation bilaterally, normal work of breathing GI: soft, nontender, nondistended, + BS MS: no deformity or atrophy  Skin: warm and dry, no rash Neuro:  Strength and sensation are intact Psych: euthymic mood, full affect Vascular: Femoral pulse is +1 on the right and absent on the left.  Distal pulses are not palpable.   EKG:  EKG is ordered today. The ekg ordered today demonstrates normal sinus rhythm with no significant ST or T wave changes.   Recent Labs: 09/17/2019: ALT 9; BUN 25; Creatinine, Ser 1.92; Potassium 4.5; Sodium 132    Lipid Panel    Component Value Date/Time   CHOL 103 12/12/2018 0000   CHOL 166 01/10/2018 0821   TRIG 95 12/12/2018 0000   HDL 40 12/12/2018 0000   HDL 30 (L) 01/10/2018 0821   CHOLHDL 5.5 (H) 01/10/2018 0821   LDLCALC 45 12/12/2018 0000   LDLCALC 77 01/10/2018 0821      Wt Readings from Last 3 Encounters:  02/22/20 214  lb (97.1 kg)  01/26/20 213 lb (96.6 kg)  01/13/20 211 lb 8 oz (95.9 kg)        No flowsheet data found.    ASSESSMENT AND PLAN:  1.  Peripheral arterial disease: The patient has severe bilateral calf claudication worse on the left side.  His iliac arteries are occluded on the left side.  He likely has significant iliac disease on the right side but does not seem to be occlusive as he has a palpable right common femoral artery pulse.  His kidney was transplanted on the right side.  I discussed with him the natural history and management of claudication.  Fortunately, he has no evidence of rest pain or critical limb ischemia. I recommend treating his risk factors and starting a regular walking program which was discussed with him today.  He is going to start.  In addition, I explained to him that it is critical that he quit smoking and he is determined to do so. Regarding revascularization options, I think we are somewhat limited by previous kidney transplant and chronic kidney disease with GFR around 35.  I think any contrast use will be associated with significant risk of contrast-induced nephropathy.  The patient prefers not to go through any procedure that requires contrast unless absolutely necessary which I agree with.  His PAD situation is certainly not limb threatening at this point.  2.  Coronary artery disease involving native coronary arteries without angina: Continue medical therapy.  3.  Tobacco use: I discussed with him the importance of smoking cessation and he is determined to quit.  4.  Hyperlipidemia: Currently on rosuvastatin and fenofibrate.  Most recent lipid profile showed an LDL of 45.    Disposition:   FU with me in 3 months  Signed,  Kathlyn Sacramento, MD  04/28/2020 9:20 AM    [place]

## 2020-05-05 ENCOUNTER — Ambulatory Visit: Payer: Medicare HMO | Admitting: Cardiovascular Disease

## 2020-05-06 ENCOUNTER — Ambulatory Visit (INDEPENDENT_AMBULATORY_CARE_PROVIDER_SITE_OTHER): Payer: Medicare HMO

## 2020-05-06 DIAGNOSIS — I10 Essential (primary) hypertension: Secondary | ICD-10-CM

## 2020-05-06 DIAGNOSIS — Z940 Kidney transplant status: Secondary | ICD-10-CM

## 2020-05-06 NOTE — Progress Notes (Signed)
Patient reports significant financial concerns affording his mycophenolic acid. Upon review, no patient assistance or disease funds are available for this medication. Will plan to mail out [facility] application to help with that medication. Advised patient to reach out to nephrology to see if they have additional resources like a specialty pharmacy or samples to help with his affordability concerns. Patient verbalized his understanding and agreement of the plan.  [place] [phone]

## 2020-05-09 ENCOUNTER — Other Ambulatory Visit: Payer: Self-pay

## 2020-05-09 MED ORDER — OMEPRAZOLE 20 MG PO CPDR
40.0000 mg | DELAYED_RELEASE_CAPSULE | Freq: Two times a day (BID) | ORAL | 1 refills | Status: DC
Start: 2020-05-09 — End: 2021-03-02

## 2020-05-09 NOTE — Telephone Encounter (Signed)
James Moreno faxed a refill request for omeprazole 20mg .  Last office visit 01/13/2020 Dyspepsia  Last refill 03/04/2020 0 refills  No appointment is scheduled

## 2020-05-12 ENCOUNTER — Telehealth: Payer: Self-pay

## 2020-05-12 ENCOUNTER — Other Ambulatory Visit: Payer: Self-pay

## 2020-05-12 ENCOUNTER — Ambulatory Visit (INDEPENDENT_AMBULATORY_CARE_PROVIDER_SITE_OTHER): Payer: Medicare HMO | Admitting: Family

## 2020-05-12 ENCOUNTER — Encounter: Payer: Self-pay | Admitting: Family

## 2020-05-12 VITALS — BP 110/60 | HR 98 | Ht 71.0 in | Wt 223.0 lb

## 2020-05-12 DIAGNOSIS — I25118 Atherosclerotic heart disease of native coronary artery with other forms of angina pectoris: Secondary | ICD-10-CM

## 2020-05-12 DIAGNOSIS — Z720 Tobacco use: Secondary | ICD-10-CM

## 2020-05-12 DIAGNOSIS — I779 Disorder of arteries and arterioles, unspecified: Secondary | ICD-10-CM | POA: Diagnosis not present

## 2020-05-12 DIAGNOSIS — E785 Hyperlipidemia, unspecified: Secondary | ICD-10-CM

## 2020-05-12 DIAGNOSIS — I739 Peripheral vascular disease, unspecified: Secondary | ICD-10-CM | POA: Diagnosis not present

## 2020-05-12 DIAGNOSIS — I10 Essential (primary) hypertension: Secondary | ICD-10-CM | POA: Diagnosis not present

## 2020-05-12 NOTE — Patient Instructions (Signed)
Medication Instructions:  No medication changes today.  Continue your current medications.   *If you need a refill on your cardiac medications before your next appointment, please call your pharmacy*   Lab Work: None ordered today. Recommend a lipid panel with your next lab work.   Testing/Procedures: Your EKG today showed normal sinus rhythm with an occasional early beat called a PVC. This is not dangerous but can sometimes feel like your heart jumps or skips a beat.   Follow-Up: At [facility], you and your health needs are our priority.  As part of our continuing mission to provide you with exceptional heart care, we have created designated Provider Care Teams.  These Care Teams include your primary Cardiologist (physician) and Advanced Practice Providers (APPs -  Physician Assistants and Nurse Practitioners) who all work together to provide you with the care you need, when you need it.  We recommend signing up for the patient portal called "MyChart".  Sign up information is provided on this After Visit Summary.  MyChart is used to connect with patients for Virtual Visits (Telemedicine).  Patients are able to view lab/test results, encounter notes, upcoming appointments, etc.  Non-urgent messages can be sent to your provider as well.   To learn more about what you can do with MyChart, go to [email or web address].    Your next appointment:   In May with Dr. Rockey Situ as scheduled  In 9 months with Dr. Fletcher Anon  Other Instructions  Managing the Challenge of Quitting Smoking Quitting smoking is a physical and mental challenge. You will face cravings, withdrawal symptoms, and temptation. Before quitting, work with your health care provider to make a plan that can help you manage quitting. Preparation can help you quit and keep you from giving in. How to manage lifestyle changes Managing stress Stress can make you want to smoke, and wanting to smoke may cause stress. It is important to  find ways to manage your stress. You might try some of the following:  Practice relaxation techniques. ? Breathe slowly and deeply, in through your nose and out through your mouth. ? Listen to music. ? Soak in a bath or take a shower. ? Imagine a peaceful place or vacation.  Get some support. ? Talk with family or friends about your stress. ? Join a support group. ? Talk with a counselor or therapist.  Get some physical activity. ? Go for a walk, run, or bike ride. ? Play a favorite sport. ? Practice yoga.   Medicines Talk with your health care provider about medicines that might help you deal with cravings and make quitting easier for you. Relationships Social situations can be difficult when you are quitting smoking. To manage this, you can:  Avoid parties and other social situations where people might be smoking.  Avoid alcohol.  Leave right away if you have the urge to smoke.  Explain to your family and friends that you are quitting smoking. Ask for support and let them know you might be a bit grumpy.  Plan activities where smoking is not an option. General instructions Be aware that many people gain weight after they quit smoking. However, not everyone does. To keep from gaining weight, have a plan in place before you quit and stick to the plan after you quit. Your plan should include:  Having healthy snacks. When you have a craving, it may help to: ? Eat popcorn, carrots, celery, or other cut vegetables. ? Chew sugar-free gum.  Changing how you eat. ?  Eat small portion sizes at meals. ? Eat 4-6 small meals throughout the day instead of 1-2 large meals a day. ? Be mindful when you eat. Do not watch television or do other things that might distract you as you eat.  Exercising regularly. ? Make time to exercise each day. If you do not have time for a long workout, do short bouts of exercise for 5-10 minutes several times a day. ? Do some form of strengthening exercise,  such as weight lifting. ? Do some exercise that gets your heart beating and causes you to breathe deeply, such as walking fast, running, swimming, or biking. This is very important.  Drinking plenty of water or other low-calorie or no-calorie drinks. Drink 6-8 glasses of water daily.   How to recognize withdrawal symptoms Your body and mind may experience discomfort as you try to get used to not having nicotine in your system. These effects are called withdrawal symptoms. They may include:  Feeling hungrier than normal.  Having trouble concentrating.  Feeling irritable or restless.  Having trouble sleeping.  Feeling depressed.  Craving a cigarette. To manage withdrawal symptoms:  Avoid places, people, and activities that trigger your cravings.  Remember why you want to quit.  Get plenty of sleep.  Avoid coffee and other caffeinated drinks. These may worsen some of your symptoms. These symptoms may surprise you. But be assured that they are normal to have when quitting smoking. How to manage cravings Come up with a plan for how to deal with your cravings. The plan should include the following:  A definition of the specific situation you want to deal with.  An alternative action you will take.  A clear idea for how this action will help.  The name of someone who might help you with this. Cravings usually last for 5-10 minutes. Consider taking the following actions to help you with your plan to deal with cravings:  Keep your mouth busy. ? Chew sugar-free gum. ? Suck on hard candies or a straw. ? Brush your teeth.  Keep your hands and body busy. ? Change to a different activity right away. ? Squeeze or play with a ball. ? Do an activity or a hobby, such as making bead jewelry, practicing needlepoint, or working with wood. ? Mix up your normal routine. ? Take a short exercise break. Go for a quick walk or run up and down stairs.  Focus on doing something kind or helpful  for someone else.  Call a friend or family member to talk during a craving.  Join a support group.  Contact a quitline. Where to find support To get help or find a support group:  Call the [facility]'s Smoking Quitline: 1-800-QUIT NOW 225 530 9628)  Visit the website of the Substance Abuse and [place]: ktimeonline.com  Text QUIT to SmokefreeTXT: 607371 Where to find more information Visit these websites to find more information on quitting smoking:  [place]: www.smokefree.gov  American Lung Association: www.lung.org  American Cancer Society: www.cancer.org  Centers for Disease Control and Prevention: http://www.wolf.info/  American Heart Association: www.heart.org Contact a health care provider if:  You want to change your plan for quitting.  The medicines you are taking are not helping.  Your eating feels out of control or you cannot sleep. Get help right away if:  You feel depressed or become very anxious. Summary  Quitting smoking is a physical and mental challenge. You will face cravings, withdrawal symptoms, and temptation  to smoke again. Preparation can help you as you go through these challenges.  Try different techniques to manage stress, handle social situations, and prevent weight gain.  You can deal with cravings by keeping your mouth busy (such as by chewing gum), keeping your hands and body busy, calling family or friends, or contacting a quitline for people who want to quit smoking.  You can deal with withdrawal symptoms by avoiding places where people smoke, getting plenty of rest, and avoiding drinks with caffeine. This information is not intended to replace advice given to you by your health care provider. Make sure you discuss any questions you have with your health care provider. Document Revised: 12/30/2018 Document Reviewed: 12/30/2018 Elsevier Patient Education  [place].

## 2020-05-12 NOTE — Telephone Encounter (Signed)
Did  Pa on omeprazole 20mg  to take 2 capsule twice a day. Did it through cover my meds. Waiting on response from insurance company

## 2020-05-12 NOTE — Progress Notes (Signed)
Office Visit    Patient Name: James Moreno Date of Encounter: 05/12/2020  PCP:  Birdie Sons, MD   [place]  Cardiologist:  Ida Rogue, MD / Kathlyn Sacramento, MD (PAD) Advanced Practice Provider:  No care team member to display Electrophysiologist:  None    Chief Complaint    James Moreno is a 55 y.o. male with a hx of bilateral ESRD previously on dialysis s/p renal transplant in 06/2014 in [place], [place], PAD with claudication, bilateral carotid artery disease, DM2, COVID19 10/2019, HTN, ongoing tobacco use, GERD presents today for follow up of CAD and PAD.    Past Medical History    Past Medical History:  Diagnosis Date  . Acute kidney failure, unspecified ([place])   . Anxiety   . Diabetes mellitus, type 2 ([place])   . GERD (gastroesophageal reflux disease)   . History of arterial disease of lower extremity   . History of hepatitis B   . Hypertension   . Hypothyroidism   . Mitral valve disorders(424.0)   . Pityriasis 12/27/2014  . Primary pulmonary HTN ([place])    Pt denies ever having.  . Tricuspid valve disorders, specified as nonrheumatic    Past Surgical History:  Procedure Laterality Date  . APPENDECTOMY    . Carotid Doppler Ultrasound  06/14/2009   39% stenosis of bilateral internal carotid artery, bilateral anterograde vertebral flow  . ESOPHAGOGASTRODUODENOSCOPY (EGD) WITH PROPOFOL N/A 01/21/2019   Procedure: ESOPHAGOGASTRODUODENOSCOPY (EGD) WITH PROPOFOL;  Surgeon: Lin Landsman, MD;  Location: [facility];  Service: Endoscopy;  Laterality: N/A;  Diabetic - insulin  . EYE SURGERY     right  . HEMORROIDECTOMY    . KIDNEY TRANSPLANT Right 2016  . MECKEL DIVERTICULUM EXCISION     infancy  . Myocardial Perfusion scan  01/17/2009   [facility], non- ischemic. LVEF= 55%  . PARS PLANA VITRECTOMY Left 02/09/2015   Procedure: Pan retinal photocoagulation 29937;  Surgeon: Milus Height, MD;  Location: [facility];   Service: Ophthalmology;  Laterality: Left;  . REFRACTIVE SURGERY Left   . sleep study  01/09/2011   Severe sleep apnea. AHI 72.9/hr. RDI=83.0/hr. Desaturation to 69.0% Emergency CPAP titaration to 14.0cm (01/14/19 resolved after wt loss after kidney transplant.)    Allergies  Allergies  Allergen Reactions  . No Known Allergies     History of Present Illness    James Moreno is a 55 y.o. male with a hx of bilateral ESRD previously on dialysis s/p renal transplant in 06/2014 in [place], [place], PAD with claudication, bilateral carotid artery disease, DM2, COVID19 10/2019, HTN, ongoing tobacco use, CAD,  GERD last seen 02/22/2020 by Dr. Rockey Situ.  Stress test April 2015 with no significant ischemia with EF 57%, low risk scan. Echocardiogram November 2015 with normal LVEF 60-65%, no RWMA, normal LV diastolic function, mildly dilated LA, mild MR, normal RVSF, normal PASP.   He was seen April 2020 reporting 4-6 month history of chest discomfort when laying down, symptoms thought to be due to reflux. Lovastatin was transitioned to Crestor.   He had noninvasive imaging at [facility] July 2020 related to PAD showing moderate bilateral lower extremity disease. He preferred noninvasive management. Carotid duplex July 2020 bilateral <50% ICA stenosis with antegrade flow of vertebral arteries.   He was diagnosed with COVID 10/2019 and did not require hospitalization. He is fully vaccinated with booster.   Evaluated by Dr. Fletcher Anon 01/2020 due to PAD with bilateral severe calf claudication  worse on left side. Occluded iliac arteries on left side. He was recommended regular walking program and to quit smoking. Revascularization options limited by previous kidney transplant and CKD.   Presents today for follow-up.  Denies chest pain, pressure, tightness.  Reports no shortness of breath at rest nor dyspnea on exertion.  No formal exercise routine but has been getting outside to play with his girlfriends dog.  Reports  stable claudication symptoms that occur with incline or after about 50 feet.  No lightheadedness, dizziness, near syncope.  EKGs/Labs/Other Studies Reviewed:   The following studies were reviewed today:   EKG:  EKG is ordered today.  The ekg ordered today demonstrates NSR 98 bpm with occasional PVC. No acute St/T wave changes.   Recent Labs: 09/17/2019: ALT 9; BUN 25; Creatinine, Ser 1.92; Potassium 4.5; Sodium 132  Recent Lipid Panel    Component Value Date/Time   CHOL 103 12/12/2018 0000   CHOL 166 01/10/2018 0821   TRIG 95 12/12/2018 0000   HDL 40 12/12/2018 0000   HDL 30 (L) 01/10/2018 0821   CHOLHDL 5.5 (H) 01/10/2018 0821   LDLCALC 45 12/12/2018 0000   LDLCALC 77 01/10/2018 0821   Home Medications   Current Meds  Medication Sig  . acetaminophen (TYLENOL) 500 MG tablet Take 500 mg by mouth every 6 (six) hours as needed (headaches.).  Marland Kitchen alprazolam (XANAX) 2 MG tablet TAKE 1 TABLET BY MOUTH THREE TIMES DAILY AS NEEDED FOR SLEEP  . amLODipine (NORVASC) 10 MG tablet Take 10 mg by mouth daily.   . B-D ULTRAFINE III SHORT PEN 31G X 8 MM MISC   . BD INSULIN SYRINGE U/F 31G X 5/16" 1 ML MISC   . Blood Glucose Monitoring Suppl (GLUCOCOM BLOOD GLUCOSE MONITOR) DEVI Frequency:ONCE   Dosage:0.0     Instructions:  Note:Dose: N/A  . calcitRIOL (ROCALTROL) 0.25 MCG capsule   . carvedilol (COREG) 6.25 MG tablet TAKE 1 TABLET TWICE A DAY WITH MEALS (NEED APPOINTMENT FOR FURTHER REFILLS)  . cinacalcet (SENSIPAR) 30 MG tablet Take 30 mg by mouth daily.  . Continuous Blood Gluc Receiver (FREESTYLE LIBRE 14 DAY READER) DEVI   . Continuous Blood Gluc Sensor (FREESTYLE LIBRE 14 DAY SENSOR) MISC   . EDEX 40 MCG injection 40 mcg by Intracavitary route as needed.   . fenofibrate (TRICOR) 145 MG tablet Take 1 tablet (145 mg total) by mouth daily.  Marland Kitchen ibuprofen (ADVIL,MOTRIN) 200 MG tablet Take 200-400 mg by mouth every 8 (eight) hours as needed (for headaches.).  Marland Kitchen insulin aspart (NOVOLOG) 100  UNIT/ML injection Inject 10-15 Units into the skin 3 (three) times daily before meals.  . Insulin Degludec (TRESIBA FLEXTOUCH [place]) Inject 30 Units into the skin daily with lunch.   . insulin glargine (LANTUS SOLOSTAR) 100 UNIT/ML Solostar Pen Inject 30 Units into the skin daily.  . Lancets (ONETOUCH DELICA PLUS [id number]) Toco   . losartan (COZAAR) 100 MG tablet Take 1 tablet (100 mg total) by mouth at bedtime.  . mycophenolate (MYFORTIC) 360 MG TBEC EC tablet Take 720 mg by mouth 2 (two) times daily.   . naloxone (NARCAN) nasal spray 4 mg/0.1 mL Place 1 spray into the nose once.   Marland Kitchen omeprazole (PRILOSEC) 20 MG capsule Take 2 capsules (40 mg total) by mouth 2 (two) times daily before a meal.  . ONETOUCH VERIO test strip   . oxyCODONE (OXY IR/ROXICODONE) 5 MG immediate release tablet Take 1-2 tablets (5-10 mg total) by mouth every 6 (six) hours  as needed for severe pain.  . rosuvastatin (CRESTOR) 40 MG tablet Take 1 tablet by mouth once daily  . sildenafil (VIAGRA) 100 MG tablet TAKE ONE-HALF (1/2) TO ONE TABLET DAILY AS NEEDED FOR ERECTILE DYSFUNCTION  . tacrolimus (PROGRAF) 1 MG capsule Take 3 mg by mouth 2 (two) times daily.   Marland Kitchen tretinoin (RETIN-A) 0.05 % cream Apply topically as needed.   . triamcinolone cream (KENALOG) 0.1 % APPLY DAILY TO INFLAMED BUMPS AS NEEDED  . varenicline (CHANTIX) 1 MG tablet Take 1 tablet (1 mg total) by mouth 2 (two) times daily.  . XYOSTED 100 MG/0.5ML SOAJ 0.5 mLs once a week. Every sunday     Review of Systems  All other systems reviewed and are otherwise negative except as noted above.  Physical Exam    VS:  BP 110/60 (BP Location: Left Arm, Patient Position: Sitting, Cuff Size: Normal)   Pulse 98   Ht 5\' 11"  (1.803 m)   Wt 223 lb (101.2 kg)   SpO2 98%   BMI 31.10 kg/m  , BMI Body mass index is 31.1 kg/m.  Wt Readings from Last 3 Encounters:  05/12/20 223 lb (101.2 kg)  02/22/20 214 lb (97.1 kg)  01/26/20 213 lb (96.6 kg)    GEN: Well nourished,  well developed, in no acute distress. HEENT: normal. Neck: Supple, no JVD, carotid bruits, or masses. Cardiac: RRR, no murmurs, rubs, or gallops. No clubbing, cyanosis, edema.  Radials 2+ and equal bilaterally.  Respiratory:  Respirations regular and unlabored, clear to auscultation bilaterally. GI: Soft, nontender, nondistended. MS: No deformity or atrophy. Skin: Warm and dry, no rash. Neuro:  Strength and sensation are intact. Psych: Normal affect.  Assessment & Plan    1. CAD - No anginal symptoms. No indication for ischemic evaluation. GDMT includes aspirin, Rosuvastatin, Amlodipine, Coreg. Heart healthy diet and regular cardiovascular exercise encouraged.   2. PAD / carotid artery disease /Tobacco use - Stable claudication symptoms.  No evidence of critical limb ischemia.  Smoking cessation encouraged. Recommend utilization of 1800QUITNOW. Declines Wellbutrin. Tells me he plans to quit smoking. Continue Aspirin, Crestor. Walking regimen encouraged.  3. ESRD - previously on dialysis. S/p renal transplant. Follows with nephrology.   4. HTN - BP well controlled. Continue current antihypertensive regimen.   5. HLD - Continue Rosuvastatin 40mg  daily. LDL goal <70.  Due for repeat lipid panel but politely declines as he tells me he has upcoming lab work with another provider.  Disposition: Follow up in 3 month(s) with Dr. Rockey Situ as scheduled and in 9 months with Dr. Fletcher Anon for follow up of PAD.   Signed, Loel Dubonnet, NP 05/12/2020, 11:13 AM Person

## 2020-05-13 ENCOUNTER — Telehealth: Payer: Self-pay

## 2020-05-13 NOTE — Chronic Care Management (AMB) (Signed)
05/13/2020- Called patient regarding Mycophenolic Acid 768 mg assistance, no answer left message to return call.  Per [employer] his medicare plan copay range $1-9 if he has a deductible but still may have to pay full price Rx outreach has $36 for #30   05/17/2020- Patient had a visit with [name], [name], assistance for Mycophenolic Acid was resolved. [name] application forms filled out for [place] and [employer] sent to [employer] to print.   05/18/2020- Called patient to inform signatures and income documentation needed for patient assistance form with [name], no answer, waiting on return call.   05/19/2020- Called patient again to see if he is available to come tomorrow to sign PAP forms to fill out. Patient stated he could come the office tomorrow to sign forms, also informed to have his income documentation, so we can fax forms to [name] to get medications soon.  Pattricia Boss, [place] Pharmacist Assistant [phone]

## 2020-05-16 ENCOUNTER — Telehealth: Payer: Self-pay

## 2020-05-16 ENCOUNTER — Other Ambulatory Visit: Payer: Self-pay | Admitting: Family Medicine

## 2020-05-16 DIAGNOSIS — L905 Scar conditions and fibrosis of skin: Secondary | ICD-10-CM

## 2020-05-16 DIAGNOSIS — R52 Pain, unspecified: Secondary | ICD-10-CM

## 2020-05-16 DIAGNOSIS — M79604 Pain in right leg: Secondary | ICD-10-CM

## 2020-05-16 NOTE — Progress Notes (Signed)
Spoke to patient to confirmed patient telephone appointment on 05/17/2020 for CCM at 3:45 pm with [name] the Clinical pharmacist.   Patient Verbalized understanding. Patient denies any side effects from current medications.  [place] Pharmacist Assistant [phone]

## 2020-05-16 NOTE — Telephone Encounter (Addendum)
30 days supply dispensed 04-21-2020

## 2020-05-16 NOTE — Telephone Encounter (Signed)
Medication Refill - Medication: oxyCODONE (OXY IR/ROXICODONE) 5 MG immediate release tablet     Preferred Pharmacy (with phone number or street name):  [place], [place] Phone:  [phone]  Fax:  [phone]       Agent: Please be advised that RX refills may take up to 3 business days. We ask that you follow-up with your pharmacy.

## 2020-05-16 NOTE — Telephone Encounter (Signed)
Requested medication (s) are due for refill today:   Provider to determine  Requested medication (s) are on the active medication list:   Yes  Future visit scheduled:   No   Last ordered: 04/21/2020 #240, 0 refills  Clinic note:  Returned because it's a non delegated refill   Requested Prescriptions  Pending Prescriptions Disp Refills   oxyCODONE (OXY IR/ROXICODONE) 5 MG immediate release tablet 240 tablet 0    Sig: Take 1-2 tablets (5-10 mg total) by mouth every 6 (six) hours as needed for severe pain.      Not Delegated - Analgesics:  Opioid Agonists Failed - 05/16/2020  9:33 AM      Failed - This refill cannot be delegated      Failed - Urine Drug Screen completed in last 360 days      Passed - Valid encounter within last 6 months    Recent Outpatient Visits           3 months ago Type 2 diabetes mellitus with diabetic nephropathy, with long-term current use of insulin ([place])   [facility] Birdie Sons, MD   9 months ago Type 2 diabetes mellitus with diabetic nephropathy, without long-term current use of insulin [facility])   [facility] Birdie Sons, MD   1 year ago Mixed hyperlipidemia   [facility] Birdie Sons, MD   2 years ago Hypertension secondary to other renal disorders   [facility] Birdie Sons, MD   2 years ago [place], MD       Future Appointments             In 3 months Gollan, Kathlene November, MD [facility]

## 2020-05-17 ENCOUNTER — Ambulatory Visit: Payer: Medicare HMO

## 2020-05-17 DIAGNOSIS — I10 Essential (primary) hypertension: Secondary | ICD-10-CM

## 2020-05-17 DIAGNOSIS — E1140 Type 2 diabetes mellitus with diabetic neuropathy, unspecified: Secondary | ICD-10-CM

## 2020-05-17 DIAGNOSIS — Z794 Long term (current) use of insulin: Secondary | ICD-10-CM

## 2020-05-17 NOTE — Progress Notes (Addendum)
Chronic Care Management Pharmacy Note  05/19/2020 Name:  James Moreno MRN:  [id number] DOB:  Feb 27, 1966  Subjective: James Moreno is an 55 y.o. year old male who is a primary patient of Fisher, Kirstie Peri, MD.  The CCM team was consulted for assistance with disease management and care coordination needs.    Engaged with patient face to face for follow up visit in response to provider referral for pharmacy case management and/or care coordination services.   Consent to Services:  The patient was given the following information about Chronic Care Management services today, agreed to services, and gave verbal consent: 1. CCM service includes personalized support from designated clinical staff supervised by the primary care provider, including individualized plan of care and coordination with other care providers 2. 24/7 contact phone numbers for assistance for urgent and routine care needs. 3. Service will only be billed when office clinical staff spend 20 minutes or more in a month to coordinate care. 4. Only one practitioner may furnish and bill the service in a calendar month. 5.The patient may stop CCM services at any time (effective at the end of the month) by phone call to the office staff. 6. The patient will be responsible for cost sharing (co-pay) of up to 20% of the service fee (after annual deductible is met). Patient agreed to services and consent obtained.  Patient Care Team: Birdie Sons, MD as PCP - General (Family Medicine) James Situ Kathlene November, MD as PCP - Cardiology (Cardiology) Ronnald Collum, James Sledge, MD as Attending Physician (Endocrinology) [place], [place] (Optometry) James Legato, MD (Nephrology) Long, Thomes Cake, MD as Referring Physician (Vascular Surgery) Isaias Sakai, MD as Referring Physician (Ophthalmology) Lin Landsman, MD as Consulting Physician (Gastroenterology) James Hampshire, MD as Consulting Physician (Cardiology) Germaine Pomfret, [facility] (Pharmacist)  Recent office visits: 02/12/20: Video visit with Dr. Caryn Section for follow-up. Patient given Lantus 30 units daily due to cost .   Recent consult visits: 05/12/20: Patient presented to Laurann [place], NP for follow-up. Claudication stable.   Hospital visits: None in previous 6 months  Objective:  Lab Results  Component Value Date   CREATININE 1.92 (H) 09/17/2019   BUN 25 (H) 09/17/2019   GFRNONAA 39 (L) 09/17/2019   GFRAA 45 (L) 09/17/2019   NA 132 (L) 09/17/2019   K 4.5 09/17/2019   CALCIUM 9.7 09/17/2019   CO2 23 09/17/2019    Lab Results  Component Value Date/Time   HGBA1C 9.7 (H) 09/17/2019 02:17 PM   HGBA1C 6.7 12/12/2018 12:00 AM   HGBA1C 10.4 (A) 04/21/2010 12:00 AM   MICROALBUR 100 12/31/2016 02:13 PM    Last diabetic Eye exam:  Lab Results  Component Value Date/Time   HMDIABEYEEXA Retinopathy (A) 02/15/2020 12:00 AM    Last diabetic Foot exam: No results found for: HMDIABFOOTEX   Lab Results  Component Value Date   CHOL 103 12/12/2018   HDL 40 12/12/2018   LDLCALC 45 12/12/2018   TRIG 95 12/12/2018   CHOLHDL 5.5 (H) 01/10/2018    Hepatic Function Latest Ref Rng & Units 09/17/2019 12/12/2018 10/21/2015  Total Protein 6.0 - 8.5 g/dL 6.5 - 7.6  Albumin 3.8 - 4.9 g/dL 4.3 - 4.7  AST 0 - 40 IU/L _0 ALT 0 - 44 IU/L 9 16 32  Alk Phosphatase 48 - 121 IU/L 131(H) 95 188(H)  Total Bilirubin 0.0 - 1.2 mg/dL 0.6 - 0.8    Lab Results  Component Value  Date/Time   TSH 1.34 12/12/2018 12:00 AM   TSH 1.72 06/13/2006 12:00 AM    CBC Latest Ref Rng & Units 12/12/2018 10/21/2015 06/21/2013  WBC - 6.7 8.9 9.7  Hemoglobin 13.5 - 17.5 14.8 16.3 10.1(L)  Hematocrit 41 - 53 43 46.7 28.9(L)  Platelets 150 - 399 182 140(L) 175    No results found for: VD25OH  Clinical ASCVD: Yes  The ASCVD Risk score Mikey Bussing DC Jr., et al., 2013) failed to calculate for the following reasons:   The valid total cholesterol range is 130 to 320 mg/dL    Depression  screen [facility] 2/9 01/19/2020 01/06/2019 01/02/2018  Decreased Interest 0 0 0  Down, Depressed, Hopeless 0 0 0  PHQ - 2 Score 0 0 0  Altered sleeping - - -  Tired, decreased energy - - -  Change in appetite - - -  Feeling bad or failure about yourself  - - -  Trouble concentrating - - -  Moving slowly or fidgety/restless - - -  Suicidal thoughts - - -  PHQ-9 Score - - -  Difficult doing work/chores - - -      Social History   Tobacco Use  Smoking Status Current Every Day Smoker  . Packs/day: 0.75  . Years: 38.00  . Pack years: 28.50  . Types: Cigarettes  Smokeless Tobacco Never Used  Tobacco Comment   since age 53.   BP Readings from Last 3 Encounters:  05/12/20 110/60  02/22/20 140/70  01/26/20 140/80   Pulse Readings from Last 3 Encounters:  05/12/20 98  02/22/20 100  01/26/20 95   Wt Readings from Last 3 Encounters:  05/12/20 223 lb (101.2 kg)  02/22/20 214 lb (97.1 kg)  01/26/20 213 lb (96.6 kg)    Assessment/Interventions: Review of patient past medical history, allergies, medications, health status, including review of consultants reports, laboratory and other test data, was performed as part of comprehensive evaluation and provision of chronic care management services.   SDOH:  (Social Determinants of Health) assessments and interventions performed: Yes SDOH Interventions   Flowsheet Row Most Recent Value  SDOH Interventions   Financial Strain Interventions Other (Comment)  [PAP]  Transportation Interventions Intervention Not Indicated      CCM Care Plan  Allergies  Allergen Reactions  . No Known Allergies     Medications Reviewed Today    Reviewed by Loel Dubonnet, NP (Nurse Practitioner) on 05/12/20 at 1112  Med List Status: <None>  Medication Order Taking? Sig Documenting Provider Last Dose Status Informant  acetaminophen (TYLENOL) 500 MG tablet [id number] Yes Take 500 mg by mouth every 6 (six) hours as needed (headaches.). [provider] Taking Active Self  alprazolam Duanne Moron) 2 MG tablet [id number] Yes TAKE 1 TABLET BY MOUTH THREE TIMES DAILY AS NEEDED FOR SLEEP Birdie Sons, MD Taking Active   amLODipine (NORVASC) 10 MG tablet [id number] Yes Take 10 mg by mouth daily.  [provider] Taking Active   B-D ULTRAFINE III SHORT PEN 31G X 8 MM MISC [id number] Yes  [provider] Taking Active   BD INSULIN SYRINGE U/F 31G X 5/16" 1 ML [place] [id number] Yes  [provider] Taking Active   Blood Glucose Monitoring Suppl ([place]) DEVI [id number] Yes Frequency:ONCE   Dosage:0.0     Instructions:  Note:Dose: N/A [provider] Taking Active Self           Med Note Kenton Kingfisher, Enrique Sack T   Wed  Oct 30, 2017 11:50 AM)    calcitRIOL (ROCALTROL) 0.25 MCG capsule [id number] Yes  [provider] Taking Active   carvedilol (COREG) 6.25 MG tablet [id number] Yes TAKE 1 TABLET TWICE A DAY WITH MEALS (NEED APPOINTMENT FOR FURTHER REFILLS) Minna Merritts, MD Taking Active            Med Note Tyrone Sage May 12, 2020 11:06 AM)    cinacalcet (SENSIPAR) 30 MG tablet [id number] Yes Take 30 mg by mouth daily. [provider] Taking Active Self  Continuous Blood Gluc Receiver (FREESTYLE LIBRE 14 DAY READER) [place] [id number] Yes  [provider] Taking Active   Continuous Blood Gluc Sensor (FREESTYLE LIBRE [place]) [place] [id number] Yes  [provider] Taking Active   EDEX 40 MCG injection [id number] Yes 40 mcg by Intracavitary route as needed.  [provider] Taking Active   fenofibrate (TRICOR) 145 MG tablet [id number] Yes Take 1 tablet (145 mg total) by mouth daily. Minna Merritts, MD Taking Active   ibuprofen (ADVIL,MOTRIN) 200 MG tablet [id number] Yes Take 200-400 mg by mouth every 8 (eight) hours as needed (for headaches.). [provider] Taking Active   insulin aspart (NOVOLOG) 100 UNIT/ML injection [id number] Yes  Inject 10-15 Units into the skin 3 (three) times daily before meals. [provider] Taking Active Self  Insulin Degludec (TRESIBA FLEXTOUCH [place]) [id number] Yes Inject 30 Units into the skin daily with lunch.  [provider] Taking Active Self  insulin glargine (LANTUS SOLOSTAR) 100 UNIT/ML Solostar Pen [id number] Yes Inject 30 Units into the skin daily. Birdie Sons, MD Taking Active   Lancets (ONETOUCH DELICA PLUS RAQTMA26J) [place] [id number] Yes  [provider] Taking Active   losartan (COZAAR) 100 MG tablet [id number] Yes Take 1 tablet (100 mg total) by mouth at bedtime. Rise Mu, PA-C Taking Active   mycophenolate (MYFORTIC) 360 MG TBEC EC tablet [id number] Yes Take 720 mg by mouth 2 (two) times daily.  [provider] Taking Active Self  naloxone [facility]) nasal spray 4 mg/0.1 mL [id number] Yes Place 1 spray into the nose once.  [provider] Taking Active   omeprazole (PRILOSEC) 20 MG capsule [id number] Yes Take 2 capsules (40 mg total) by mouth 2 (two) times daily before a meal. Lin Landsman, MD Taking Active   [facility] VERIO test strip [id number] Yes  [provider] Taking Active   oxyCODONE (OXY IR/ROXICODONE) 5 MG immediate release tablet [id number] Yes Take 1-2 tablets (5-10 mg total) by mouth every 6 (six) hours as needed for severe pain. Birdie Sons, MD Taking Active   rosuvastatin (CRESTOR) 40 MG tablet [id number] Yes Take 1 tablet by mouth once daily Minna Merritts, MD Taking Active   sildenafil (VIAGRA) 100 MG tablet [id number] Yes TAKE ONE-HALF (1/2) TO ONE TABLET DAILY AS NEEDED FOR ERECTILE DYSFUNCTION Birdie Sons, MD Taking Active   tacrolimus (PROGRAF) 1 MG capsule [id number] Yes Take 3 mg by mouth 2 (two) times daily.  [provider] Taking Active Self           Med Note Kenton Kingfisher, Germaine Pomfret Oct 30, 2017 11:51 AM)    tretinoin (RETIN-A) 0.05 % cream [id number] Yes Apply topically as needed.   [provider] Taking Active   triamcinolone cream (KENALOG) 0.1 % [id number] Yes APPLY DAILY TO INFLAMED BUMPS AS NEEDED [provider] Taking Active   varenicline (CHANTIX) 1 MG tablet [id number]  Yes Take 1 tablet (1 mg total) by mouth 2 (two) times daily. Minna Merritts, MD Taking Active   XYOSTED 100 MG/0.5ML [place] [id number] Yes 0.5 mLs once a week. Every sunday [provider] Taking Active           Patient Active Problem List   Diagnosis Date Noted  . Proliferative retinopathy of left eye due to diabetes mellitus ([place]) 01/28/2019  . Esophageal dysphagia   . Benign essential hypertension 12/11/2018  . Secondary hyperparathyroidism of renal origin ([place]) 12/11/2018  . Severe tobacco use disorder 08/01/2018  . Atherosclerosis of artery of extremity with ulceration ([place]) 07/23/2018  . Gastroesophageal reflux disease 05/26/2018  . PAD (peripheral artery disease) ([place]) 10/30/2017  . Chronic ulcer of heel, right, with unspecified severity ([place]) 10/16/2017  . Hepatitis B core antibody positive 07/03/2017  . Hypertriglyceridemia 07/03/2017  . Chronic, continuous use of opioids 03/28/2016  . Anxiety 03/28/2016  . Pain in surgical scar 11/07/2015  . Bulging eyes 01/24/2015  . Leg mass 01/24/2015  . Renal transplant, status post 01/24/2015  . Carotid arterial disease ([place]) 12/27/2014  . Compulsive tobacco user syndrome 12/27/2014  . Abnormal EKG 04/06/2013  . Erectile dysfunction 11/29/2012  . Obesity 11/28/2012  . Type 2 diabetes mellitus with diabetic nephropathy ([place]) 11/28/2012  . Hypertension 12/21/2011  . Hyperlipidemia 12/21/2011  . 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 10/30/2011  . Obstructive apnea 01/09/2011  . History of other malignant neoplasm of skin 07/16/2006  . Glaucoma 01/13/2006  . Episodic paroxysmal anxiety disorder 03/26/1998    Immunization History  Administered Date(s) Administered  . Influenza Split 12/05/2010  .  Influenza,inj,Quad PF,6+ Mos 12/31/2016, 01/27/2019  . PFIZER(Purple Top)SARS-COV-2 Vaccination 06/18/2019, 07/09/2019, 02/22/2020  . Pneumococcal Polysaccharide-23 10/24/2010  . Tdap 12/05/2010    Conditions to be addressed/monitored:  Hypertension, Hyperlipidemia, Diabetes, Coronary Artery Disease, GERD, Anxiety, Tobacco use and History of Renal Transplant   Care Plan : General Pharmacy (Adult)  Updates made by Germaine Pomfret, RPH since 05/19/2020 12:00 AM    Problem: Hypertension, Hyperlipidemia, Diabetes, Coronary Artery Disease, GERD, Anxiety, Tobacco use and History of Renal Transplant   Priority: High    Long-Range Goal: Patient-Specific Goal   Start Date: 05/19/2020  Expected End Date: 11/16/2020  This Visit's Progress: On track  Priority: High  Note:   Current Barriers:  . Unable to independently afford treatment regimen . Unable to achieve control of Diabetes   Pharmacist Clinical Goal(s):  Marland Kitchen Over the next 90 days, patient will verbalize ability to afford treatment regimen . achieve control of Diabetes as evidenced by A1c less than 7% through collaboration with PharmD and provider.   Interventions: . 1:1 collaboration with Birdie Sons, MD regarding development and update of comprehensive plan of care as evidenced by provider attestation and co-signature . Inter-disciplinary care team collaboration (see longitudinal plan of care) . Comprehensive medication review performed; medication list updated in electronic medical record  Hypertension (BP goal <130/80) -Controlled -Current treatment: . Amlodipine 10 mg daily  . Carvedilol 6.25 mg twice daily  . Losartan 100 mg daily  -Medications previously tried: NA  -Current home readings: NA -Current dietary habits: NA -Current exercise habits: NA -Denies hypotensive/hypertensive symptoms -Educated on Daily salt intake goal < 2300 mg; Importance of home blood pressure monitoring; -Counseled to monitor BP at home  2-3 times weekly, document, and provide log at future appointments -Recommended to continue current medication  Hyperlipidemia: (LDL goal < 70) -History of PAD, CAD  -Controlled -Current treatment: .  Rosuvastatin 40 mg daily  -Medications previously tried: NA  -Educated on Importance of limiting foods high in cholesterol; -Recommended to continue current medication  Diabetes (A1c goal <7%) -Managed by Dr. Ronnald Collum  -Uncontrolled -Current medications: . Novolog 10-16 units three times daily per sliding scale  . Lantus 30 units daily -Medications previously tried: [place] (switched due to cost)   -Current home glucose readings . fasting glucose: NA . post prandial glucose: NA -Denies hypoglycemic/hyperglycemic symptoms -Educated on Benefits of routine self-monitoring of blood sugar; Continuous glucose monitoring; Patient was interested in seeing if getting CGM through Part B supplier would be more affordable option for him.   -Counseled to check feet daily and get yearly eye exams -Recommended to continue current medication Assessed patient finances. Patient had not yet received patient assistance for Novolog or [place]. Will reprint and coordinate patient to come into clinic to sign.   - Paperwork for FreeStyle Libre 2 meter + sensor completed, will fax to Dr. Ronnald Collum for review and signature.   Depression/Anxiety (Goal: Maintain stable mood and sleep) -Controlled -Current treatment: . Alprazolam 2 mg three times daily as needed - Sleep  -Medications previously tried/failed: NA -PHQ9: 0 -GAD7: 0 -Educated on Benefits of medication for symptom control Benefits of cognitive-behavioral therapy with or without medication -Recommended to continue current medication   Renal Transplant  (Goal: prevent rejection of kidney ) -Managed by Dr. Holley Raring  -Controlled -Current treatment  . Mycophenolate 500 mg 2 tablets twice daily  . Tacrolimus 1 mg 3 capsules twice daily  -Medications  previously tried: Myfortic (cost) -Patient reports nephrology was able to switch to more affordable option and he will start mycophenolate as soon as he finishes his current supply of Myfortic.   -Recommended to continue current medication   Patient Goals/Self-Care Activities . Over the next 90 days, patient will:  - check glucose 2-3 times daily , document, and provide at future appointments check blood pressure 2-3 times weekly , document, and provide at future appointments  Follow Up Plan: Telephone follow up appointment with care management team member scheduled for: 06/17/2020 at 1:00 PM       Medication Assistance: Application for Tyler Aas, Novolog   medication assistance program. in process.  Anticipated assistance start date 06/22/2020.  See plan of care for additional detail.  Patient's preferred pharmacy is:  [facility] [address] [place] [address] [place] [address] Phone: [phone] Fax: [phone]  [facility] (ESRD Bundle Only) - [place], [address] [place] [address] Phone: [phone] Fax: [place], [place] [address] [address] Phone: [phone] Fax: [place], [place] [address] Phone: [phone] Fax: [phone]  Uses pill box? Yes Pt endorses 100% compliance  We discussed: Current pharmacy is preferred with insurance plan and patient is satisfied with pharmacy services Patient decided to: Continue current medication management strategy  Care Plan and Follow Up Patient Decision:  Patient agrees to Care Plan and Follow-up.  Plan: Telephone follow up appointment with care management team member scheduled for:  06/17/2020 at 1:00 PM   [place] [phone]

## 2020-05-19 NOTE — Patient Instructions (Signed)
Visit Information It was great speaking with you today!  Please let me know if you have any questions about our visit.  Goals Addressed            This Visit's Progress   . Monitor and Manage My Blood Sugar-Diabetes Type 2       Timeframe:  Long-Range Goal Priority:  High Start Date: 05/17/2020                            Expected End Date: 11/16/2020                       Follow Up Date 06/16/2020    - check blood sugar at prescribed times - check blood sugar if I feel it is too high or too low - enter blood sugar readings and medication or insulin into daily log    Why is this important?    Checking your blood sugar at home helps to keep it from getting very high or very low.   Writing the results in a diary or log helps the doctor know how to care for you.   Your blood sugar log should have the time, date and the results.   Also, write down the amount of insulin or other medicine that you take.   Other information, like what you ate, exercise done and how you were feeling, will also be helpful.     Notes:     . Quit Smoking   Not on track    Recommend to continue efforts to reduce smoking habits until no longer smoking (Smoking Cessation literature attached to AVS).           Patient Care Plan: General Pharmacy (Adult)    Problem Identified: Hypertension, Hyperlipidemia, Diabetes, Coronary Artery Disease, GERD, Anxiety, Tobacco use and History of Renal Transplant   Priority: High    Long-Range Goal: Patient-Specific Goal   Start Date: 05/19/2020  Expected End Date: 11/16/2020  This Visit's Progress: On track  Priority: High  Note:   Current Barriers:  . Unable to independently afford treatment regimen . Unable to achieve control of Diabetes   Pharmacist Clinical Goal(s):  Marland Kitchen Over the next 90 days, patient will verbalize ability to afford treatment regimen . achieve control of Diabetes as evidenced by A1c less than 7% through collaboration with PharmD and  provider.   Interventions: . 1:1 collaboration with Birdie Sons, MD regarding development and update of comprehensive plan of care as evidenced by provider attestation and co-signature . Inter-disciplinary care team collaboration (see longitudinal plan of care) . Comprehensive medication review performed; medication list updated in electronic medical record  Hypertension (BP goal <130/80) -Controlled -Current treatment: . Amlodipine 10 mg daily  . Carvedilol 6.25 mg twice daily  . Losartan 100 mg daily  -Medications previously tried: NA  -Current home readings: NA -Current dietary habits: NA -Current exercise habits: NA -Denies hypotensive/hypertensive symptoms -Educated on Daily salt intake goal < 2300 mg; Importance of home blood pressure monitoring; -Counseled to monitor BP at home 2-3 times weekly, document, and provide log at future appointments -Recommended to continue current medication  Hyperlipidemia: (LDL goal < 70) -History of PAD, CAD  -Controlled -Current treatment: . Rosuvastatin 40 mg daily  -Medications previously tried: NA  -Educated on Importance of limiting foods high in cholesterol; -Recommended to continue current medication  Diabetes (A1c goal <7%) -Managed by Dr. Ronnald Collum  -Uncontrolled -Current  medications: . Novolog 10-16 units three times daily per sliding scale  . Lantus 30 units daily -Medications previously tried: [place] (switched due to cost)   -Current home glucose readings . fasting glucose: NA . post prandial glucose: NA -Denies hypoglycemic/hyperglycemic symptoms -Educated on Benefits of routine self-monitoring of blood sugar; Continuous glucose monitoring; Patient was interested in seeing if getting CGM through Part B supplier would be more affordable option for him.   -Counseled to check feet daily and get yearly eye exams -Recommended to continue current medication Assessed patient finances. Patient had not yet received patient  assistance for Novolog or [place]. Will reprint and coordinate patient to come into clinic to sign.   - Paperwork for FreeStyle Libre 2 meter + sensor completed, will fax to Dr. Ronnald Collum for review and signature.   Depression/Anxiety (Goal: Maintain stable mood and sleep) -Controlled -Current treatment: . Alprazolam 2 mg three times daily as needed - Sleep  -Medications previously tried/failed: NA -PHQ9: 0 -GAD7: 0 -Educated on Benefits of medication for symptom control Benefits of cognitive-behavioral therapy with or without medication -Recommended to continue current medication   Renal Transplant  (Goal: prevent rejection of kidney ) -Managed by Dr. Holley Raring  -Controlled -Current treatment  . Mycophenolate 500 mg 2 tablets twice daily  . Tacrolimus 1 mg 3 capsules twice daily  -Medications previously tried: Myfortic (cost) -Patient reports nephrology was able to switch to more affordable option and he will start mycophenolate as soon as he finishes his current supply of Myfortic.   -Recommended to continue current medication   Patient Goals/Self-Care Activities . Over the next 90 days, patient will:  - check glucose 2-3 times daily , document, and provide at future appointments check blood pressure 2-3 times weekly , document, and provide at future appointments  Follow Up Plan: Telephone follow up appointment with care management team member scheduled for: 06/17/2020 at 1:00 PM       Patient agreed to services and verbal consent obtained.   The patient verbalized understanding of instructions, educational materials, and care plan provided today and declined offer to receive copy of patient instructions, educational materials, and care plan.   [place] [phone]

## 2020-05-19 NOTE — Telephone Encounter (Signed)
Pt calling to follow up on oxyCODONE (OXY IR/ROXICODONE) 5 MG immediate release tablet. Pt has run out early.

## 2020-05-20 ENCOUNTER — Telehealth: Payer: Medicare HMO | Admitting: Family Medicine

## 2020-05-20 ENCOUNTER — Other Ambulatory Visit: Payer: Self-pay

## 2020-05-20 MED ORDER — OXYCODONE HCL 5 MG PO TABS
5.0000 mg | ORAL_TABLET | Freq: Four times a day (QID) | ORAL | 0 refills | Status: DC | PRN
Start: 2020-05-20 — End: 2020-06-15

## 2020-05-20 NOTE — Telephone Encounter (Signed)
Pt called and appt has been scheduled for next friday

## 2020-05-20 NOTE — Telephone Encounter (Signed)
Please see message below

## 2020-05-20 NOTE — Telephone Encounter (Signed)
Patient has uncontrolled diabetes and is due for a follow up. If he has seen Dr. Carmela Rima in the last 3 months we need to get records.  Either way he needs to schedule follow up office visit before any additional refills can be approved.

## 2020-05-23 ENCOUNTER — Telehealth: Payer: Self-pay | Admitting: Family Medicine

## 2020-05-23 NOTE — Telephone Encounter (Signed)
Im not sure what this is about. I called Sharyn Lull with Dr. Nicki Reaper office. She advised me that their office received a fax from [place] with a progress note from 05/17/2020 for face to face. Sharyn Lull states that Dr. Bary Leriche can not sign off on that note because he hasn't seen the patient yet. Sharyn Lull asked me to pass the message along to [facility] that patient has a upcoming appointment with Dr. Bary Leriche on 06/01/2020. During that visit, Dr. Bary Leriche will discuss getting the [place] sensor and paperwork for financial assistance

## 2020-05-23 NOTE — Telephone Encounter (Signed)
Sharyn Lull calling from  [facility] is calling regarding an encounter note or progress note on 05/17/20 that is not signed by a pharmacist . But wanting Dr. Rulon Eisenmenger to sign  Secondly, there is is paper work Dr Rulon Eisenmenger for patient assistance but the patient has not been seen.  Please advise CB- [phone]

## 2020-05-25 DIAGNOSIS — E785 Hyperlipidemia, unspecified: Secondary | ICD-10-CM | POA: Diagnosis not present

## 2020-05-25 DIAGNOSIS — E08311 Diabetes mellitus due to underlying condition with unspecified diabetic retinopathy with macular edema: Secondary | ICD-10-CM | POA: Diagnosis not present

## 2020-05-25 DIAGNOSIS — E1165 Type 2 diabetes mellitus with hyperglycemia: Secondary | ICD-10-CM | POA: Diagnosis not present

## 2020-05-26 LAB — HEMOGLOBIN A1C: Hemoglobin A1C: 8.8

## 2020-05-27 ENCOUNTER — Ambulatory Visit: Payer: Self-pay | Admitting: Family Medicine

## 2020-05-30 ENCOUNTER — Other Ambulatory Visit: Payer: Self-pay | Admitting: *Deleted

## 2020-05-30 MED ORDER — ROSUVASTATIN CALCIUM 40 MG PO TABS: 40.0000 mg | ORAL_TABLET | Freq: Every day | ORAL | 1 refills | Status: DC

## 2020-06-01 ENCOUNTER — Telehealth: Payer: Self-pay

## 2020-06-01 DIAGNOSIS — E785 Hyperlipidemia, unspecified: Secondary | ICD-10-CM | POA: Diagnosis not present

## 2020-06-01 DIAGNOSIS — N2581 Secondary hyperparathyroidism of renal origin: Secondary | ICD-10-CM | POA: Diagnosis not present

## 2020-06-01 DIAGNOSIS — E1121 Type 2 diabetes mellitus with diabetic nephropathy: Secondary | ICD-10-CM | POA: Diagnosis not present

## 2020-06-01 DIAGNOSIS — E669 Obesity, unspecified: Secondary | ICD-10-CM | POA: Diagnosis not present

## 2020-06-01 DIAGNOSIS — L821 Other seborrheic keratosis: Secondary | ICD-10-CM | POA: Diagnosis not present

## 2020-06-01 DIAGNOSIS — I10 Essential (primary) hypertension: Secondary | ICD-10-CM | POA: Diagnosis not present

## 2020-06-01 DIAGNOSIS — A630 Anogenital (venereal) warts: Secondary | ICD-10-CM | POA: Diagnosis not present

## 2020-06-01 DIAGNOSIS — E0859 Diabetes mellitus due to underlying condition with other circulatory complications: Secondary | ICD-10-CM | POA: Diagnosis not present

## 2020-06-01 DIAGNOSIS — N189 Chronic kidney disease, unspecified: Secondary | ICD-10-CM | POA: Diagnosis not present

## 2020-06-01 DIAGNOSIS — E118 Type 2 diabetes mellitus with unspecified complications: Secondary | ICD-10-CM | POA: Diagnosis not present

## 2020-06-01 DIAGNOSIS — D485 Neoplasm of uncertain behavior of skin: Secondary | ICD-10-CM | POA: Diagnosis not present

## 2020-06-01 DIAGNOSIS — I7389 Other specified peripheral vascular diseases: Secondary | ICD-10-CM | POA: Diagnosis not present

## 2020-06-01 NOTE — Chronic Care Management (AMB) (Addendum)
Chronic Care Management Pharmacy Assistant   Name: James Moreno  MRN: [id number] DOB: [birth year]   Reason for Encounter: Patient assistance Coordination  06/01/2020- Called patient regarding patient assistance follow up call, no answer, left message to return call.  Patient returned call, he was very upset due to his Endocrinologist not want to fill out paperwork for him to get a Continuous Glucose Monitor and his nurse/secretary is out of the office for a few weeks and it would have to wait until then. Patient requesting a new referral to another Endocrinologist and wanted to know [name], [name] had any suggestions? Patient needed to call me back to discuss further issues. Patient returned call, he stated the same situation for his patient assistance form for Novolog with [name], that Endocrinologist did not want to sign, he is wondering if Dr. Caryn Section would sign off on forms so he can get his medications from the patient assistance program due to cost. Patient mentioned that Dr Ronnald Collum sent in a glucose monitor to the pharmacy but it was going to cost him over $200 and he could not afford that. [name], [name] notified.     Medications: Outpatient Encounter Medications as of 06/01/2020  Medication Sig   acetaminophen (TYLENOL) 500 MG tablet Take 500 mg by mouth every 6 (six) hours as needed (headaches.).   alprazolam (XANAX) 2 MG tablet TAKE 1 TABLET BY MOUTH THREE TIMES DAILY AS NEEDED FOR SLEEP   amLODipine (NORVASC) 10 MG tablet Take 10 mg by mouth daily.    B-D ULTRAFINE III SHORT PEN 31G X 8 MM MISC    BD INSULIN SYRINGE U/F 31G X 5/16" 1 ML MISC    Blood Glucose Monitoring Suppl (GLUCOCOM BLOOD GLUCOSE MONITOR) DEVI Frequency:ONCE   Dosage:0.0     Instructions:  Note:Dose: N/A   calcitRIOL (ROCALTROL) 0.25 MCG capsule    carvedilol (COREG) 6.25 MG tablet TAKE 1 TABLET TWICE A DAY WITH MEALS (NEED APPOINTMENT FOR FURTHER REFILLS)   cinacalcet (SENSIPAR) 30 MG tablet Take  30 mg by mouth daily.   EDEX 40 MCG injection 40 mcg by Intracavitary route as needed.    fenofibrate (TRICOR) 145 MG tablet Take 1 tablet (145 mg total) by mouth daily.   ibuprofen (ADVIL,MOTRIN) 200 MG tablet Take 200-400 mg by mouth every 8 (eight) hours as needed (for headaches.).   insulin aspart (NOVOLOG) 100 UNIT/ML injection Inject 10-15 Units into the skin 3 (three) times daily before meals.   insulin glargine (LANTUS SOLOSTAR) 100 UNIT/ML Solostar Pen Inject 30 Units into the skin daily.   Lancets (ONETOUCH DELICA PLUS [id number]) MISC    losartan (COZAAR) 100 MG tablet Take 1 tablet (100 mg total) by mouth at bedtime.   mycophenolate (CELLCEPT) 500 MG tablet Take 1,000 mg by mouth 2 (two) times daily.   naloxone (NARCAN) nasal spray 4 mg/0.1 mL Place 1 spray into the nose once.    omeprazole (PRILOSEC) 20 MG capsule Take 2 capsules (40 mg total) by mouth 2 (two) times daily before a meal.   ONETOUCH VERIO test strip    oxyCODONE (OXY IR/ROXICODONE) 5 MG immediate release tablet Take 1-2 tablets (5-10 mg total) by mouth every 6 (six) hours as needed for severe pain.   rosuvastatin (CRESTOR) 40 MG tablet Take 1 tablet (40 mg total) by mouth daily.   sildenafil (VIAGRA) 100 MG tablet TAKE ONE-HALF (1/2) TO ONE TABLET DAILY AS NEEDED FOR ERECTILE DYSFUNCTION   tacrolimus (PROGRAF) 1 MG capsule  Take 3 mg by mouth 2 (two) times daily.    tretinoin (RETIN-A) 0.05 % cream Apply topically as needed.    triamcinolone cream (KENALOG) 0.1 % APPLY DAILY TO INFLAMED BUMPS AS NEEDED   varenicline (CHANTIX) 1 MG tablet Take 1 tablet (1 mg total) by mouth 2 (two) times daily.   XYOSTED 100 MG/0.5ML SOAJ 0.5 mLs once a week. Every sunday   No facility-administered encounter medications on file as of 06/01/2020.    Star Rating Drugs: Losartan 100 mg- Last filled 12/22/2019 for 90 day supply at [facility]. Lovastatin 40 mg- Last filled 01/07/2019 for 90 day supply [facility]. Rosuvastatin 40 mg- Last  filled 04/25/2020 for 30 day supply at [facility].   Pattricia Boss, [place] Pharmacist Assistant [phone]   Addendum 06/07/20: [name] Patient Assistance form for Novolog and tresiba completed by patient and faxed for review on 06/07/20

## 2020-06-08 ENCOUNTER — Ambulatory Visit: Payer: Self-pay | Admitting: Family Medicine

## 2020-06-10 ENCOUNTER — Other Ambulatory Visit: Payer: Self-pay | Admitting: Family Medicine

## 2020-06-10 NOTE — Telephone Encounter (Signed)
Requested medication (s) are due for refill today: yes   Requested medication (s) are on the active medication list: yes   Last refill:  04/21/20 #90 0 refills  Future visit scheduled: yes   Notes to clinic:  not delegated per protocol     Requested Prescriptions  Pending Prescriptions Disp Refills   alprazolam (XANAX) 2 MG tablet 90 tablet 0      Not Delegated - Psychiatry:  Anxiolytics/Hypnotics Failed - 06/10/2020  5:16 PM      Failed - This refill cannot be delegated      Failed - Urine Drug Screen completed in last 360 days      Passed - Valid encounter within last 6 months    Recent Outpatient Visits           3 months ago Type 2 diabetes mellitus with diabetic nephropathy, with long-term current use of insulin ([place])   [facility] Birdie Sons, MD   10 months ago Type 2 diabetes mellitus with diabetic nephropathy, without long-term current use of insulin [facility])   [facility] Birdie Sons, MD   1 year ago Mixed hyperlipidemia   [facility] Birdie Sons, MD   2 years ago Hypertension secondary to other renal disorders   [facility] Birdie Sons, MD   2 years ago [place], Kirstie Peri, MD       Future Appointments             In 1 week [place], Kirstie Peri, MD [facility], [place]   In 2 months [place], Kathlene November, MD [facility] [place]

## 2020-06-10 NOTE — Telephone Encounter (Signed)
Medication Refill - Medication:   alprazolam Duanne Moron) 2 MG tablet    Has the patient contacted their pharmacy? Yes.  Stated the pharmacy should have already sent something over.    Preferred Pharmacy (with phone number or street name):  [facility] [address] [place]  [place] [address]  Phone: [phone] Fax: [phone]     Agent: Please be advised that RX refills may take up to 3 business days. We ask that you follow-up with your pharmacy.

## 2020-06-14 MED ORDER — ALPRAZOLAM 2 MG PO TABS: ORAL_TABLET | ORAL | 0 refills | Status: DC

## 2020-06-15 ENCOUNTER — Other Ambulatory Visit: Payer: Self-pay | Admitting: Family Medicine

## 2020-06-15 DIAGNOSIS — R52 Pain, unspecified: Secondary | ICD-10-CM

## 2020-06-15 DIAGNOSIS — L905 Scar conditions and fibrosis of skin: Secondary | ICD-10-CM

## 2020-06-15 DIAGNOSIS — M79604 Pain in right leg: Secondary | ICD-10-CM

## 2020-06-15 NOTE — Telephone Encounter (Signed)
Requested medication (s) are due for refill today: yes  Requested medication (s) are on the active medication list: yes  Last refill:  05/20/2020  Future visit scheduled: yes  Notes to clinic:  this refill cannot be delegated    Requested Prescriptions  Pending Prescriptions Disp Refills   oxyCODONE (OXY IR/ROXICODONE) 5 MG immediate release tablet 240 tablet 0    Sig: Take 1-2 tablets (5-10 mg total) by mouth every 6 (six) hours as needed for severe pain.      There is no refill protocol information for this order

## 2020-06-15 NOTE — Telephone Encounter (Signed)
Copied from [place] [id number]. Topic: Quick Communication - Rx Refill/Question >> Jun 15, 2020 10:24 AM Yvette Rack wrote: Medication: oxyCODONE (OXY IR/ROXICODONE) 5 MG immediate release tablet  Has the patient contacted their pharmacy? no  Preferred Pharmacy (with phone number or street name): [place], [place]  Phone: [phone]   Fax: [phone]  Agent: Please be advised that RX refills may take up to 3 business days. We ask that you follow-up with your pharmacy.

## 2020-06-17 ENCOUNTER — Telehealth: Payer: Self-pay

## 2020-06-19 MED ORDER — OXYCODONE HCL 5 MG PO TABS
5.0000 mg | ORAL_TABLET | Freq: Four times a day (QID) | ORAL | 0 refills | Status: DC | PRN
Start: 2020-06-19 — End: 2020-07-15

## 2020-06-20 ENCOUNTER — Other Ambulatory Visit: Payer: Self-pay

## 2020-06-20 ENCOUNTER — Encounter: Payer: Self-pay | Admitting: Family Medicine

## 2020-06-20 ENCOUNTER — Ambulatory Visit (INDEPENDENT_AMBULATORY_CARE_PROVIDER_SITE_OTHER): Payer: Medicare HMO | Admitting: Family Medicine

## 2020-06-20 VITALS — BP 124/60 | HR 91 | Temp 98.7°F | Wt 222.6 lb

## 2020-06-20 DIAGNOSIS — R52 Pain, unspecified: Secondary | ICD-10-CM

## 2020-06-20 DIAGNOSIS — E782 Mixed hyperlipidemia: Secondary | ICD-10-CM

## 2020-06-20 DIAGNOSIS — Z125 Encounter for screening for malignant neoplasm of prostate: Secondary | ICD-10-CM | POA: Diagnosis not present

## 2020-06-20 DIAGNOSIS — E1121 Type 2 diabetes mellitus with diabetic nephropathy: Secondary | ICD-10-CM | POA: Diagnosis not present

## 2020-06-20 DIAGNOSIS — F1190 Opioid use, unspecified, uncomplicated: Secondary | ICD-10-CM | POA: Diagnosis not present

## 2020-06-20 DIAGNOSIS — L905 Scar conditions and fibrosis of skin: Secondary | ICD-10-CM | POA: Diagnosis not present

## 2020-06-20 DIAGNOSIS — I10 Essential (primary) hypertension: Secondary | ICD-10-CM

## 2020-06-20 DIAGNOSIS — F17200 Nicotine dependence, unspecified, uncomplicated: Secondary | ICD-10-CM | POA: Diagnosis not present

## 2020-06-20 MED ORDER — NICOTINE 21 MG/24HR TD PT24: 21.0000 mg | MEDICATED_PATCH | Freq: Every day | TRANSDERMAL | 0 refills | Status: DC

## 2020-06-20 NOTE — Progress Notes (Signed)
Established patient visit   Patient: James Moreno   DOB: May 01, 1965   55 y.o. Male  MRN: [id number] Visit Date: 06/20/2020  Today's healthcare provider: Lelon Huh, MD   Chief Complaint  Patient presents with  . Diabetes  . Hypertension  . Hyperlipidemia  I,Porsha C McClurkin,acting as a scribe for Lelon Huh, MD.,have documented all relevant documentation on the behalf of Lelon Huh, MD,as directed by  Lelon Huh, MD while in the presence of Lelon Huh, MD.  Subjective    HPI  Diabetes Mellitus Type II, follow-up  Lab Results  Component Value Date   HGBA1C 9.7 (H) 09/17/2019   HGBA1C 6.7 12/12/2018   HGBA1C 10.4 (A) 04/21/2010   Last seen for diabetes 4 months ago.  Management since then includes continuing the same treatment. He reports good compliance with treatment. He is not having side effects.   Home blood sugar records: 150's-260's  Episodes of hypoglycemia? No    Current insulin regiment:changed to (LANTUS SOLOSTAR) 100 UNIT/ML Solostar Pen; Inject 30 Units into the skin daily Most Recent Eye Exam: Due  He did follow up with Dr. Carmela Rima a few weeks ago and has report showing A1c was 8.8% on 06-02-2019. He has been trying to get patient assistance for his diabetes medications but has been having difficulty getting assistance from Dr. Barkley Boards office. He would like referral to a different endocrinologist.  --------------------------------------------------------------------------------------------------- Hypertension, follow-up  BP Readings from Last 3 Encounters:  06/20/20 (!) 142/73  05/12/20 110/60  02/22/20 140/70   Wt Readings from Last 3 Encounters:  06/20/20 222 lb 9.6 oz (101 kg)  05/12/20 223 lb (101.2 kg)  02/22/20 214 lb (97.1 kg)     He was last seen for hypertension 4 months ago.  BP at that visit was 140/70. Management since that visit includes continue current medication . He reports good compliance with  treatment. He is not having side effects.  He is not exercising. He is adherent to low salt diet.   Outside blood pressures are .  He does smoke.  Use of agents associated with hypertension: none.   --------------------------------------------------------------------------------------------------- Lipid/Cholesterol, follow-up  Last Lipid Panel: Lab Results  Component Value Date   CHOL 103 12/12/2018   LDLCALC 45 12/12/2018   HDL 40 12/12/2018   TRIG 95 12/12/2018    He was last seen for this 4 months ago.  Management since that visit includes continue current medication.  He reports good compliance with treatment. He is not having side effects.   Symptoms: No appetite changes No foot ulcerations  No chest pain No chest pressure/discomfort  No dyspnea No orthopnea  No fatigue No lower extremity edema  No palpitations No paroxysmal nocturnal dyspnea  No nausea No numbness or tingling of extremity  No polydipsia No polyuria  No speech difficulty No syncope   He is following a Regular diet. Current exercise: none  Last metabolic panel Lab Results  Component Value Date   GLUCOSE 432 (H) 09/17/2019   NA 132 (L) 09/17/2019   K 4.5 09/17/2019   BUN 25 (H) 09/17/2019   CREATININE 1.92 (H) 09/17/2019   GFRNONAA 39 (L) 09/17/2019   GFRAA 45 (L) 09/17/2019   CALCIUM 9.7 09/17/2019   AST 9 09/17/2019   ALT 9 09/17/2019   The ASCVD Risk score Mikey Bussing DC Jr., et al., 2013) failed to calculate for the following reasons:   The valid total cholesterol range is 130 to 320 mg/dL  ---------------------------------------------------------------------------------------------------  Medications: Outpatient Medications Prior to Visit  Medication Sig  . acetaminophen (TYLENOL) 500 MG tablet Take 500 mg by mouth every 6 (six) hours as needed (headaches.).  Marland Kitchen alprazolam (XANAX) 2 MG tablet TAKE 1 TABLET BY MOUTH THREE TIMES DAILY AS NEEDED FOR SLEEP  . amLODipine (NORVASC)  10 MG tablet Take 10 mg by mouth daily.   . B-D ULTRAFINE III SHORT PEN 31G X 8 MM MISC   . BD INSULIN SYRINGE U/F 31G X 5/16" 1 ML MISC   . Blood Glucose Monitoring Suppl (GLUCOCOM BLOOD GLUCOSE MONITOR) DEVI Frequency:ONCE   Dosage:0.0     Instructions:  Note:Dose: N/A  . calcitRIOL (ROCALTROL) 0.25 MCG capsule   . carvedilol (COREG) 6.25 MG tablet TAKE 1 TABLET TWICE A DAY WITH MEALS (NEED APPOINTMENT FOR FURTHER REFILLS)  . cinacalcet (SENSIPAR) 30 MG tablet Take 30 mg by mouth daily.  Marland Kitchen EDEX 40 MCG injection 40 mcg by Intracavitary route as needed.   . fenofibrate (TRICOR) 145 MG tablet Take 1 tablet (145 mg total) by mouth daily.  Marland Kitchen ibuprofen (ADVIL,MOTRIN) 200 MG tablet Take 200-400 mg by mouth every 8 (eight) hours as needed (for headaches.).  Marland Kitchen insulin aspart (NOVOLOG) 100 UNIT/ML injection Inject 10-15 Units into the skin 3 (three) times daily before meals.  . insulin glargine (LANTUS SOLOSTAR) 100 UNIT/ML Solostar Pen Inject 30 Units into the skin daily.  . Lancets (ONETOUCH DELICA PLUS [id number]) [place]   . losartan (COZAAR) 100 MG tablet Take 1 tablet (100 mg total) by mouth at bedtime.  . mycophenolate (CELLCEPT) 500 MG tablet Take 1,000 mg by mouth 2 (two) times daily.  . naloxone (NARCAN) nasal spray 4 mg/0.1 mL Place 1 spray into the nose once.   Marland Kitchen omeprazole (PRILOSEC) 20 MG capsule Take 2 capsules (40 mg total) by mouth 2 (two) times daily before a meal.  . ONETOUCH VERIO test strip   . oxyCODONE (OXY IR/ROXICODONE) 5 MG immediate release tablet Take 1-2 tablets (5-10 mg total) by mouth every 6 (six) hours as needed for severe pain.  . rosuvastatin (CRESTOR) 40 MG tablet Take 1 tablet (40 mg total) by mouth daily.  . sildenafil (VIAGRA) 100 MG tablet TAKE ONE-HALF (1/2) TO ONE TABLET DAILY AS NEEDED FOR ERECTILE DYSFUNCTION  . tacrolimus (PROGRAF) 1 MG capsule Take 3 mg by mouth 2 (two) times daily.   Marland Kitchen tretinoin (RETIN-A) 0.05 % cream Apply topically as needed.   .  triamcinolone cream (KENALOG) 0.1 % APPLY DAILY TO INFLAMED BUMPS AS NEEDED  . varenicline (CHANTIX) 1 MG tablet Take 1 tablet (1 mg total) by mouth 2 (two) times daily.  . XYOSTED 100 MG/0.5ML SOAJ 0.5 mLs once a week. Every sunday   No facility-administered medications prior to visit.    Review of Systems  Constitutional: Negative for chills, fatigue and fever.  Cardiovascular: Negative for chest pain, palpitations and leg swelling.  Hematological: Negative for adenopathy. Does not bruise/bleed easily.       Objective    BP 124/60   Pulse 91   Temp 98.7 F (37.1 C) (Oral)   Wt 222 lb 9.6 oz (101 kg)   SpO2 99%   BMI 31.05 kg/m     Physical Exam    General: Appearance:    Mildly obese male in no acute distress  Eyes:    PERRL, conjunctiva/corneas clear, EOM's intact       Lungs:     Clear to auscultation bilaterally, respirations unlabored  Heart:  Normal heart rate. Normal rhythm. No murmurs, rubs, or gallops.   MS:   All extremities are intact.   Neurologic:   Awake, alert, oriented x 3. No apparent focal neurological           defect.         Assessment & Plan     1. Type 2 diabetes mellitus with diabetic nephropathy, without long-term current use of insulin (HCC) Not at goal, but improving. Discussed other local endocrinologist that he could transfer. He states he doesn't need a referral and will call himself. Recommended routine diabetic eye exam.    2. Mixed hyperlipidemia He is tolerating rosuvastatin well with no adverse effects.   - Lipid panel  3. Primary hypertension Fairly well controlled. Continue current medications.   - Renal function panel  4. Compulsive tobacco user syndrome Encouraged smoking cessation.  - nicotine (NICODERM CQ) 21 mg/24hr patch; Place 1 patch (21 mg total) onto the skin daily.  Dispense: 28 patch; Refill: 0  5. Prostate cancer screening  - PSA  6. Chronic pain secondary to prior surgical wounds.   pain well controlled  on current regiment with no indication of abuse or diversion.      The entirety of the information documented in the History of Present Illness, Review of Systems and Physical Exam were personally obtained by me. Portions of this information were initially documented by the CMA and reviewed by me for thoroughness and accuracy.      Lelon Huh, MD  [facility] [phone] (phone) [phone] (fax)  [place]

## 2020-06-20 NOTE — Patient Instructions (Addendum)
   You are due for Cologuard screening this year. You should get a collection at your home in May   I recommend Dr. Honor Junes or Dr. Nilda Simmer for diabetic menage ment.    Try [place] for diabetic foot exam

## 2020-06-21 DIAGNOSIS — E1121 Type 2 diabetes mellitus with diabetic nephropathy: Secondary | ICD-10-CM | POA: Diagnosis not present

## 2020-06-21 DIAGNOSIS — Z794 Long term (current) use of insulin: Secondary | ICD-10-CM | POA: Diagnosis not present

## 2020-06-21 LAB — RENAL FUNCTION PANEL
Albumin: 4.2 g/dL (ref 3.8–4.9)
BUN/Creatinine Ratio: 17 (ref 9–20)
BUN: 29 mg/dL — ABNORMAL HIGH (ref 6–24)
CO2: 21 mmol/L (ref 20–29)
Calcium: 10.7 mg/dL — ABNORMAL HIGH (ref 8.7–10.2)
Chloride: 102 mmol/L (ref 96–106)
Creatinine, Ser: 1.72 mg/dL — ABNORMAL HIGH (ref 0.76–1.27)
Glucose: 62 mg/dL — ABNORMAL LOW (ref 65–99)
Phosphorus: 2.9 mg/dL (ref 2.8–4.1)
Potassium: 4.2 mmol/L (ref 3.5–5.2)
Sodium: 139 mmol/L (ref 134–144)
eGFR: 46 mL/min/{1.73_m2} — ABNORMAL LOW (ref >59)

## 2020-06-21 LAB — LIPID PANEL
Chol/HDL Ratio: 3.1 ratio (ref 0.0–5.0)
Cholesterol, Total: 141 mg/dL (ref 100–199)
HDL: 46 mg/dL (ref >39)
LDL Chol Calc (NIH): 53 mg/dL (ref 0–99)
Triglycerides: 268 mg/dL — ABNORMAL HIGH (ref 0–149)
VLDL Cholesterol Cal: 42 mg/dL — ABNORMAL HIGH (ref 5–40)

## 2020-06-21 LAB — PSA: Prostate Specific Ag, Serum: 0.2 ng/mL (ref 0.0–4.0)

## 2020-06-27 ENCOUNTER — Telehealth: Payer: Self-pay

## 2020-06-27 NOTE — Chronic Care Management (AMB) (Signed)
Chronic Care Management Pharmacy Assistant   Name: James Moreno  MRN: [id number] DOB: [birth year]  Reason for Encounter: Patient Assistance Coordination  06/27/2020- [name] to inquire on patient's Novolog patient assistance application. Spoke with James Moreno, representative from [name], application was pending due to needing to validate patient's address. Confirmed patient's address of [address] [name], [place] [address]. Representative was able to approve application. Patient is approved until 03/25/2021. A letter will sent sent to patient and PCP office in 7-10 buisness days and order will be shipped, medication should arrive to providers office in 10-14 buisness days.  06/29/2020- Patient called, gave updates on approval with [name] on his Novolog prescription. Patient is very happy and appreciative, he also mentioned that he had to see his Endocrinologist- Dr. Bunnie Moreno and he had not sent forms yet so he was glad his PCP was able to sign form him. Patient is still in the process of find a new Endocrinologist. Patient was very appreciative for the help and aware medication should be in the office by the week of 07/11/2020. Patient will contact me on 07/15/2020 if he had not heard from office that medication has arrived. I will then call [employer] to get patient a 30 day free voucher on his medication.    Medications: Outpatient Encounter Medications as of 06/27/2020  Medication Sig  . acetaminophen (TYLENOL) 500 MG tablet Take 500 mg by mouth every 6 (six) hours as needed (headaches.).  Marland Kitchen alprazolam (XANAX) 2 MG tablet TAKE 1 TABLET BY MOUTH THREE TIMES DAILY AS NEEDED FOR SLEEP  . amLODipine (NORVASC) 10 MG tablet Take 10 mg by mouth daily.   . B-D ULTRAFINE III SHORT PEN 31G X 8 MM MISC   . BD INSULIN SYRINGE U/F 31G X 5/16" 1 ML MISC   . Blood Glucose Monitoring Suppl (GLUCOCOM BLOOD GLUCOSE MONITOR) DEVI Frequency:ONCE   Dosage:0.0     Instructions:   Note:Dose: N/A  . calcitRIOL (ROCALTROL) 0.25 MCG capsule   . carvedilol (COREG) 6.25 MG tablet TAKE 1 TABLET TWICE A DAY WITH MEALS (NEED APPOINTMENT FOR FURTHER REFILLS)  . cinacalcet (SENSIPAR) 30 MG tablet Take 30 mg by mouth daily.  Marland Kitchen EDEX 40 MCG injection 40 mcg by Intracavitary route as needed.   . fenofibrate (TRICOR) 145 MG tablet Take 1 tablet (145 mg total) by mouth daily.  Marland Kitchen ibuprofen (ADVIL,MOTRIN) 200 MG tablet Take 200-400 mg by mouth every 8 (eight) hours as needed (for headaches.).  Marland Kitchen insulin aspart (NOVOLOG) 100 UNIT/ML injection Inject 10-15 Units into the skin 3 (three) times daily before meals.  . insulin glargine (LANTUS SOLOSTAR) 100 UNIT/ML Solostar Pen Inject 30 Units into the skin daily.  . Lancets (ONETOUCH DELICA PLUS [id number]) [place]   . losartan (COZAAR) 100 MG tablet Take 1 tablet (100 mg total) by mouth at bedtime.  . mycophenolate (CELLCEPT) 500 MG tablet Take 1,000 mg by mouth 2 (two) times daily.  . naloxone (NARCAN) nasal spray 4 mg/0.1 mL Place 1 spray into the nose once.   . nicotine (NICODERM CQ) 21 mg/24hr patch Place 1 patch (21 mg total) onto the skin daily.  Marland Kitchen omeprazole (PRILOSEC) 20 MG capsule Take 2 capsules (40 mg total) by mouth 2 (two) times daily before a meal.  . ONETOUCH VERIO test strip   . oxyCODONE (OXY IR/ROXICODONE) 5 MG immediate release tablet Take 1-2 tablets (5-10 mg total) by mouth every 6 (six) hours as needed for severe pain.  Marland Kitchen  rosuvastatin (CRESTOR) 40 MG tablet Take 1 tablet (40 mg total) by mouth daily.  . sildenafil (VIAGRA) 100 MG tablet TAKE ONE-HALF (1/2) TO ONE TABLET DAILY AS NEEDED FOR ERECTILE DYSFUNCTION  . tacrolimus (PROGRAF) 1 MG capsule Take 3 mg by mouth 2 (two) times daily.   Marland Kitchen tretinoin (RETIN-A) 0.05 % cream Apply topically as needed.   . triamcinolone cream (KENALOG) 0.1 % APPLY DAILY TO INFLAMED BUMPS AS NEEDED  . XYOSTED 100 MG/0.5ML SOAJ 0.5 mLs once a week. Every sunday   No facility-administered  encounter medications on file as of 06/27/2020.    Star Rating Drugs: Tyler Aas- Last filled 12/28/2019 for 50 day supply at [facility]. Losartan 100 mg- Last filled 12/22/2019 for 90 day supply at [facility]. Lovastatin 40 mg- [facility]/07/2019 [facility]. Rosuvastatin 40 mg- Last filled 05/30/2020 for 90 day supply at [facility].     SIG: James Moreno, [place] Pharmacist Assistant [phone]

## 2020-06-28 DIAGNOSIS — N2581 Secondary hyperparathyroidism of renal origin: Secondary | ICD-10-CM | POA: Diagnosis not present

## 2020-06-28 DIAGNOSIS — E0859 Diabetes mellitus due to underlying condition with other circulatory complications: Secondary | ICD-10-CM | POA: Diagnosis not present

## 2020-06-28 DIAGNOSIS — I10 Essential (primary) hypertension: Secondary | ICD-10-CM | POA: Diagnosis not present

## 2020-06-28 DIAGNOSIS — E118 Type 2 diabetes mellitus with unspecified complications: Secondary | ICD-10-CM | POA: Diagnosis not present

## 2020-06-28 DIAGNOSIS — E669 Obesity, unspecified: Secondary | ICD-10-CM | POA: Diagnosis not present

## 2020-06-28 DIAGNOSIS — E1121 Type 2 diabetes mellitus with diabetic nephropathy: Secondary | ICD-10-CM | POA: Diagnosis not present

## 2020-06-28 DIAGNOSIS — I7389 Other specified peripheral vascular diseases: Secondary | ICD-10-CM | POA: Diagnosis not present

## 2020-06-28 DIAGNOSIS — E785 Hyperlipidemia, unspecified: Secondary | ICD-10-CM | POA: Diagnosis not present

## 2020-06-28 DIAGNOSIS — N189 Chronic kidney disease, unspecified: Secondary | ICD-10-CM | POA: Diagnosis not present

## 2020-06-30 ENCOUNTER — Telehealth: Payer: Self-pay

## 2020-06-30 NOTE — Progress Notes (Signed)
Spoke to patient to confirmed patient telephone appointment on 07/01/2020 for CCM at 12:30 pm with [name] the Clinical pharmacist.   Patient Verbalized understanding and denies any side effects from any of his current medications  [place] Pharmacist Assistant [phone]

## 2020-07-01 ENCOUNTER — Telehealth: Payer: Self-pay

## 2020-07-01 NOTE — Progress Notes (Incomplete)
Chronic Care Management Pharmacy Note  07/01/2020 Name:  James Moreno MRN:  [id number] DOB:  Aug 08, 1965  Subjective: James Moreno is an 55 y.o. year old male who is a primary patient of Fisher, Kirstie Peri, MD.  The CCM team was consulted for assistance with disease management and care coordination needs.    Engaged with patient by telephone for follow up visit in response to provider referral for pharmacy case management and/or care coordination services.   Consent to Services:  The patient was given information about Chronic Care Management services, agreed to services, and gave verbal consent prior to initiation of services.  Please see initial visit note for detailed documentation.   Patient Care Team: Birdie Sons, MD as PCP - General (Family Medicine) Rockey Situ Kathlene November, MD as PCP - Cardiology (Cardiology) Ronnald Collum, Lourdes Sledge, MD as Attending Physician (Endocrinology) Pa, [place] (Optometry) Anthonette Legato, MD (Nephrology) Long, Thomes Cake, MD as Referring Physician (Vascular Surgery) Isaias Sakai, MD as Referring Physician (Ophthalmology) Lin Landsman, MD as Consulting Physician (Gastroenterology) Wellington Hampshire, MD as Consulting Physician (Cardiology) Germaine Pomfret, [facility] (Pharmacist)  Recent office visits: 06/20/20: Patient presented to Dr. Caryn Section for follow-up. Chantix stopped, patient started on Nicotine Patch 21 mg daily. Patient not interested in endocrinologist.  02/12/20: Video visit with Dr. Caryn Section for follow-up. Patient given Lantus 30 units daily due to cost .   Recent consult visits: 06/01/20: Patient presented to Dr. Ronnald Collum (Endocrinology) for initial visit.  05/12/20: Patient presented to Laurann Montana, NP for follow-up. Claudication stable.   Hospital visits: None in previous 6 months  Objective:  Lab Results  Component Value Date   CREATININE 1.72 (H) 06/20/2020   BUN 29 (H) 06/20/2020   GFRNONAA 39 (L) 09/17/2019    GFRAA 45 (L) 09/17/2019   NA 139 06/20/2020   K 4.2 06/20/2020   CALCIUM 10.7 (H) 06/20/2020   CO2 21 06/20/2020    Lab Results  Component Value Date/Time   HGBA1C 8.8 06/01/2020 12:00 AM   HGBA1C 9.7 (H) 09/17/2019 02:17 PM   HGBA1C 6.7 12/12/2018 12:00 AM   MICROALBUR 100 12/31/2016 02:13 PM    Last diabetic Eye exam:  Lab Results  Component Value Date/Time   HMDIABEYEEXA Retinopathy (A) 02/15/2020 12:00 AM    Last diabetic Foot exam: No results found for: HMDIABFOOTEX   Lab Results  Component Value Date   CHOL 141 06/20/2020   HDL 46 06/20/2020   LDLCALC 53 06/20/2020   TRIG 268 (H) 06/20/2020   CHOLHDL 3.1 06/20/2020    Hepatic Function Latest Ref Rng & Units 06/20/2020 09/17/2019 12/12/2018  Total Protein 6.0 - 8.5 g/dL - 6.5 -  Albumin 3.8 - 4.9 g/dL 4.2 4.3 -  AST 0 - 40 IU/L - 9 14  ALT 0 - 44 IU/L - 9 16  Alk Phosphatase 48 - 121 IU/L - 131(H) 95  Total Bilirubin 0.0 - 1.2 mg/dL - 0.6 -    Lab Results  Component Value Date/Time   TSH 1.34 12/12/2018 12:00 AM   TSH 1.72 06/13/2006 12:00 AM    CBC Latest Ref Rng & Units 12/12/2018 10/21/2015 06/21/2013  WBC - 6.7 8.9 9.7  Hemoglobin 13.5 - 17.5 14.8 16.3 10.1(L)  Hematocrit 41 - 53 43 46.7 28.9(L)  Platelets 150 - 399 182 140(L) 175    No results found for: VD25OH  Clinical ASCVD: Yes  The 10-year ASCVD risk score Mikey Bussing DC Jr., et al., 2013) is: 15.4%  Values used to calculate the score:     Age: 62 years     Sex: Male     Is Non-Hispanic African American: No     Diabetic: Yes     Tobacco smoker: Yes     Systolic Blood Pressure: 151 mmHg     Is BP treated: Yes     HDL Cholesterol: 46 mg/dL     Total Cholesterol: 141 mg/dL    Depression screen [facility] 2/9 01/19/2020 01/06/2019 01/02/2018  Decreased Interest 0 0 0  Down, Depressed, Hopeless 0 0 0  PHQ - 2 Score 0 0 0  Altered sleeping - - -  Tired, decreased energy - - -  Change in appetite - - -  Feeling bad or failure about yourself  - - -   Trouble concentrating - - -  Moving slowly or fidgety/restless - - -  Suicidal thoughts - - -  PHQ-9 Score - - -  Difficult doing work/chores - - -      Social History   Tobacco Use  Smoking Status Current Every Day Smoker  . Packs/day: 0.75  . Years: 38.00  . Pack years: 28.50  . Types: Cigarettes  Smokeless Tobacco Never Used  Tobacco Comment   since age 55.   BP Readings from Last 3 Encounters:  06/20/20 124/60  05/12/20 110/60  02/22/20 140/70   Pulse Readings from Last 3 Encounters:  06/20/20 91  05/12/20 98  02/22/20 100   Wt Readings from Last 3 Encounters:  06/20/20 222 lb 9.6 oz (101 kg)  05/12/20 223 lb (101.2 kg)  02/22/20 214 lb (97.1 kg)    Assessment/Interventions: Review of patient past medical history, allergies, medications, health status, including review of consultants reports, laboratory and other test data, was performed as part of comprehensive evaluation and provision of chronic care management services.   SDOH:  (Social Determinants of Health) assessments and interventions performed: Yes   CCM Care Plan  Allergies  Allergen Reactions  . No Known Allergies     Medications Reviewed Today    Reviewed by Germaine Pomfret, [facility] (Pharmacist) on 05/19/20 at 1349  Med List Status: <None>  Medication Order Taking? Sig Documenting Provider Last Dose Status Informant  acetaminophen (TYLENOL) 500 MG tablet [id number]  Take 500 mg by mouth every 6 (six) hours as needed (headaches.). [provider]  Active Self  alprazolam Duanne Moron) 2 MG tablet [id number]  TAKE 1 TABLET BY MOUTH THREE TIMES DAILY AS NEEDED FOR SLEEP Birdie Sons, MD  Active   amLODipine (NORVASC) 10 MG tablet [id number]  Take 10 mg by mouth daily.  [provider]  Active   B-D ULTRAFINE III SHORT PEN 31G X 8 MM MISC [id number]   [provider]  Active   BD INSULIN SYRINGE U/F 31G X 5/16" 1 ML [place] [id number]   [provider]  Active   Blood  Glucose Monitoring Suppl ([place]) DEVI [id number]  Frequency:ONCE   Dosage:0.0     Instructions:  Note:Dose: N/A [provider]  Active Self           Med Note Peggye Pitt T   Wed Oct 30, 2017 11:50 AM)    calcitRIOL (ROCALTROL) 0.25 MCG capsule [id number]   [provider]  Active   carvedilol (COREG) 6.25 MG tablet [id number]  TAKE 1 TABLET TWICE A DAY WITH MEALS (NEED APPOINTMENT FOR FURTHER REFILLS) Minna Merritts, MD  Active  Med Note Janan Ridge   Thu May 12, 2020 11:06 AM)    cinacalcet (SENSIPAR) 30 MG tablet [id number]  Take 30 mg by mouth daily. [provider]  Active Self  EDEX 40 MCG injection [id number]  40 mcg by Intracavitary route as needed.  [provider]  Active   fenofibrate (TRICOR) 145 MG tablet [id number]  Take 1 tablet (145 mg total) by mouth daily. Minna Merritts, MD  Active   ibuprofen (ADVIL,MOTRIN) 200 MG tablet [id number]  Take 200-400 mg by mouth every 8 (eight) hours as needed (for headaches.). [provider]  Active   insulin aspart (NOVOLOG) 100 UNIT/ML injection [id number]  Inject 10-15 Units into the skin 3 (three) times daily before meals. [provider]  Active Self       Patient not taking:      Discontinued 05/19/20 1349 (Change in therapy)   insulin glargine (LANTUS SOLOSTAR) 100 UNIT/ML Solostar Pen [id number]  Inject 30 Units into the skin daily. Birdie Sons, MD  Active   Lancets (ONETOUCH DELICA PLUS [id number]) [place] [id number]   [provider]  Active   losartan (COZAAR) 100 MG tablet [id number]  Take 1 tablet (100 mg total) by mouth at bedtime. Rise Mu, PA-C  Active   mycophenolate (CELLCEPT) 500 MG tablet [id number] Yes Take 1,000 mg by mouth 2 (two) times daily. [provider]  Active   naloxone [facility]) nasal spray 4 mg/0.1 mL [id number]  Place 1 spray into the nose once.  [provider]  Active   omeprazole  (PRILOSEC) 20 MG capsule [id number]  Take 2 capsules (40 mg total) by mouth 2 (two) times daily before a meal. Lin Landsman, MD  Active   [facility] An VERIO test strip [id number]   [provider]  Active   oxyCODONE (OXY IR/ROXICODONE) 5 MG immediate release tablet [id number]  Take 1-2 tablets (5-10 mg total) by mouth every 6 (six) hours as needed for severe pain. Birdie Sons, MD  Active   rosuvastatin (CRESTOR) 40 MG tablet [id number]  Take 1 tablet by mouth once daily Minna Merritts, MD  Active   sildenafil (VIAGRA) 100 MG tablet [id number]  TAKE ONE-HALF (1/2) TO ONE TABLET DAILY AS NEEDED FOR ERECTILE DYSFUNCTION Birdie Sons, MD  Active   tacrolimus (PROGRAF) 1 MG capsule [id number]  Take 3 mg by mouth 2 (two) times daily.  [provider]  Active Self           Med Note Kenton Kingfisher, Germaine Pomfret Oct 30, 2017 11:51 AM)    tretinoin (RETIN-A) 0.05 % cream [id number]  Apply topically as needed.  [provider]  Active   triamcinolone cream (KENALOG) 0.1 % [id number]  APPLY DAILY TO INFLAMED BUMPS AS NEEDED [provider]  Active   varenicline (CHANTIX) 1 MG tablet [id number]  Take 1 tablet (1 mg total) by mouth 2 (two) times daily. Minna Merritts, MD  Active   XYOSTED 100 MG/0.5ML [place] Palmer [id number]  0.5 mLs once a week. Every sunday [provider]  Active           Patient Active Problem List   Diagnosis Date Noted  . Proliferative retinopathy of left eye due to diabetes mellitus ([place]) 01/28/2019  . Esophageal dysphagia   . Benign essential hypertension 12/11/2018  . Secondary hyperparathyroidism of renal origin ([place]) 12/11/2018  . Severe tobacco use disorder 08/01/2018  . Atherosclerosis of artery of extremity with ulceration (  [place]) 07/23/2018  . Gastroesophageal reflux disease 05/26/2018  . PAD (peripheral artery disease) ([place]) 10/30/2017  . Chronic ulcer of heel, right, with unspecified severity ([place]) 10/16/2017  . Hepatitis B core  antibody positive 07/03/2017  . Hypertriglyceridemia 07/03/2017  . Chronic, continuous use of opioids 03/28/2016  . Anxiety 03/28/2016  . Pain in surgical scar 11/07/2015  . Bulging eyes 01/24/2015  . Leg mass 01/24/2015  . Renal transplant, status post 01/24/2015  . Carotid arterial disease ([place]) 12/27/2014  . Compulsive tobacco user syndrome 12/27/2014  . Abnormal EKG 04/06/2013  . Erectile dysfunction 11/29/2012  . Obesity 11/28/2012  . Type 2 diabetes mellitus with diabetic nephropathy ([place]) 11/28/2012  . Hypertension 12/21/2011  . Hyperlipidemia 12/21/2011  . 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 10/30/2011  . Obstructive apnea 01/09/2011  . History of other malignant neoplasm of skin 07/16/2006  . Glaucoma 01/13/2006  . Episodic paroxysmal anxiety disorder 03/26/1998    Immunization History  Administered Date(s) Administered  . Influenza Split 12/05/2010  . Influenza,inj,Quad PF,6+ Mos 12/31/2016, 01/27/2019  . PFIZER(Purple Top)SARS-COV-2 Vaccination 06/18/2019, 07/09/2019, 02/22/2020  . Pneumococcal Polysaccharide-23 10/24/2010  . Tdap 12/05/2010    Conditions to be addressed/monitored:  Hypertension, Hyperlipidemia, Diabetes, Coronary Artery Disease, GERD, Anxiety, Tobacco use and History of Renal Transplant   There are no care plans that you recently modified to display for this patient.    Medication Assistance: Application for Tyler Aas, Novolog   medication assistance program. in process.  Anticipated assistance start date 06/22/2020.  See plan of care for additional detail.  Patient's preferred pharmacy is:  [facility] [address] [place] [address] [place] [address] Phone: [phone] Fax: [phone]  [facility] (ESRD Bundle Only) - [place], [address] [place] [address] Phone: [phone] Fax: [place], [place] [address] [place] [address] Phone: [phone] Fax: [place], [place] [address] Phone: [phone] Fax: [phone]  Uses pill box? Yes Pt endorses 100% compliance  We discussed: Current pharmacy is preferred with insurance plan and patient is satisfied with pharmacy services Patient decided to: Continue current medication management strategy  Care Plan and Follow Up Patient Decision:  Patient agrees to Care Plan and Follow-up.  Plan: Telephone follow up appointment with care management team member scheduled for:  06/17/2020 at 1:00 PM   [place] [phone]  Patient Care Plan: General Pharmacy (Adult)    Problem Identified: Hypertension, Hyperlipidemia, Diabetes, Coronary Artery Disease, GERD, Anxiety, Tobacco use and History of Renal Transplant   Priority: High    Long-Range Goal: Patient-Specific Goal   Start Date: 05/19/2020  Expected End Date: 11/16/2020  This Visit's Progress: On track  Priority: High  Note:   Current Barriers:  . Unable to independently afford treatment regimen . Unable to achieve control of Diabetes   Pharmacist Clinical Goal(s):  Marland Kitchen Over the next 90 days, patient will verbalize ability to afford treatment regimen . achieve control of Diabetes as evidenced by A1c less than 7% through collaboration with PharmD and provider.   Interventions: . 1:1 collaboration with Birdie Sons, MD regarding development and update of comprehensive plan of care as evidenced by provider attestation and co-signature . Inter-disciplinary care team collaboration (see longitudinal plan of care) . Comprehensive medication review performed; medication  list updated in electronic medical record  Hypertension (BP goal <130/80) -Controlled -Current treatment: . Amlodipine 10 mg daily  . Carvedilol  6.25 mg twice daily  . Losartan 100 mg daily  -Medications previously tried: NA  -Current home readings: NA -Current dietary habits: NA -Current exercise habits: NA -Denies hypotensive/hypertensive symptoms -Educated on Daily salt intake goal < 2300 mg; Importance of home blood pressure monitoring; -Counseled to monitor BP at home 2-3 times weekly, document, and provide log at future appointments -Recommended to continue current medication  Hyperlipidemia: (LDL goal < 70) -History of PAD, CAD  -Controlled -Current treatment: . Rosuvastatin 40 mg daily  -Medications previously tried: NA  -Educated on Importance of limiting foods high in cholesterol; -Recommended to continue current medication  Diabetes (A1c goal <7%) -Uncontrolled -Current medications: . Novolog 10-16 units three times daily per sliding scale  . Tresiba 30 units daily -Medications previously tried: [place] (switched due to cost)   -Current home glucose readings . fasting glucose: NA . post prandial glucose: NA -Denies hypoglycemic/hyperglycemic symptoms -Educated on Benefits of routine self-monitoring of blood sugar; Continuous glucose monitoring; Patient was interested in seeing if getting CGM through [employer] supplier would be more affordable option for him.   -Counseled to check feet daily and get yearly eye exams -Recommended to continue current medication Assessed patient finances. Patient had not yet received patient assistance for Novolog or [place]. Will reprint and coordinate patient to come into clinic to sign.   - Paperwork for FreeStyle Libre 2 meter + sensor completed, will fax to Dr. Ronnald Collum for review and signature.   Depression/Anxiety (Goal: Maintain stable mood and sleep) -Controlled -Current treatment: . Alprazolam 2 mg three times daily as needed - Sleep  -Medications previously tried/failed: NA -PHQ9: 0 -GAD7: 0 -Educated on Benefits of medication for symptom control Benefits of  cognitive-behavioral therapy with or without medication -Recommended to continue current medication   Renal Transplant  (Goal: prevent rejection of kidney ) -Managed by Dr. Holley Raring  -Controlled -Current treatment  . Mycophenolate 500 mg 2 tablets twice daily  . Tacrolimus 1 mg 3 capsules twice daily  -Medications previously tried: Myfortic (cost) -Patient reports nephrology was able to switch to more affordable option and he will start mycophenolate as soon as he finishes his current supply of Myfortic.   -Recommended to continue current medication  Tobacco use (Goal Quit Smoking) -Controlled -Previous quit attempts: Chantix -Current treatment  . Nicotine 21 mg /24 hr Patch -Patient smokes {Time to first cigarette:23873} -Patient triggers include: boredom  and finishing a meal -On a scale of 1-10, reports MOTIVATION to quit is *** -On a scale of 1-10, reports CONFIDENCE in quitting is *** -Patient set quit date of May 1st. -{CCMPHARMDINTERVENTION:25122}  Down to 15 cigarettes  avoding smoking in house, ashtrays    Patient Goals/Self-Care Activities . Over the next 90 days, patient will:  - check glucose 2-3 times daily , document, and provide at future appointments check blood pressure 2-3 times weekly , document, and provide at future appointments  Follow Up Plan: Telephone follow up appointment with care management team member scheduled for: 06/17/2020 at 1:00 PM

## 2020-07-12 ENCOUNTER — Other Ambulatory Visit: Payer: Self-pay | Admitting: Family Medicine

## 2020-07-12 NOTE — Telephone Encounter (Signed)
Requested medication (s) are due for refill today: yes  Requested medication (s) are on the active medication list: yes   Last refill:  06/14/2020  Future visit scheduled:  Yes  Notes to clinic:  this refill cannot be delegated    Requested Prescriptions  Pending Prescriptions Disp Refills   alprazolam (XANAX) 2 MG tablet [Pharmacy Med Name: ALPRAZolam 2 MG Oral Tablet] 90 tablet 0    Sig: TAKE 1 TABLET BY MOUTH THREE TIMES DAILY AS NEEDED FOR SLEEP      Not Delegated - Psychiatry:  Anxiolytics/Hypnotics Failed - 07/12/2020 10:00 AM      Failed - This refill cannot be delegated      Failed - Urine Drug Screen completed in last 360 days      Passed - Valid encounter within last 6 months    Recent Outpatient Visits           3 weeks ago Type 2 diabetes mellitus with diabetic nephropathy, without long-term current use of insulin ([place])   [facility] Birdie Sons, MD   5 months ago Type 2 diabetes mellitus with diabetic nephropathy, with long-term current use of insulin ([place])   [facility] Birdie Sons, MD   11 months ago Type 2 diabetes mellitus with diabetic nephropathy, without long-term current use of insulin [facility])   [facility] Birdie Sons, MD   1 year ago Mixed hyperlipidemia   [facility] Birdie Sons, MD   2 years ago Hypertension secondary to other renal disorders   [facility] Birdie Sons, MD       Future Appointments             In 1 month Gollan, Kathlene November, MD [facility], [facility]   In 2 months Fisher, Kirstie Peri, MD [facility], [place]

## 2020-07-12 NOTE — Telephone Encounter (Signed)
Pt called stating that he is needing to have both alprazolam and oxycodone refilled. Please advise.

## 2020-07-13 ENCOUNTER — Telehealth: Payer: Self-pay

## 2020-07-13 NOTE — Progress Notes (Signed)
Per Clinical Pharmacist, PCP office received a delivery of his insulin + pens.Informed patient to pick up his delivery at his PCP office.  Patient states he is out of town, but will be back on Friday on 07/15/2020. Patient states he will pick up his delivery on 07/15/2020.Notifed Clinical Pharmacist.  [employer] Pharmacist Assistant [phone]

## 2020-07-14 ENCOUNTER — Other Ambulatory Visit: Payer: Self-pay | Admitting: Family Medicine

## 2020-07-14 DIAGNOSIS — F17200 Nicotine dependence, unspecified, uncomplicated: Secondary | ICD-10-CM

## 2020-07-14 MED ORDER — NICOTINE 14 MG/24HR TD PT24: 14.0000 mg | MEDICATED_PATCH | Freq: Every day | TRANSDERMAL | 0 refills | Status: DC

## 2020-07-15 ENCOUNTER — Other Ambulatory Visit: Payer: Self-pay | Admitting: Family Medicine

## 2020-07-15 DIAGNOSIS — R52 Pain, unspecified: Secondary | ICD-10-CM

## 2020-07-15 DIAGNOSIS — L905 Scar conditions and fibrosis of skin: Secondary | ICD-10-CM

## 2020-07-15 DIAGNOSIS — M79604 Pain in right leg: Secondary | ICD-10-CM

## 2020-07-15 NOTE — Telephone Encounter (Signed)
Medication Refill - Medication: oxyCODONE (OXY IR/ROXICODONE) 5 MG immediate release tablet   Pt states that he called this in on Tuesday   Has the patient contacted their pharmacy? Yes.   (Agent: If no, request that the patient contact the pharmacy for the refill.) (Agent: If yes, when and what did the pharmacy advise?)  Preferred Pharmacy (with phone number or street name):  [facility] [address], [place] [address]  Phone: [phone] Fax: [phone]     Agent: Please be advised that RX refills may take up to 3 business days. We ask that you follow-up with your pharmacy.

## 2020-07-15 NOTE — Telephone Encounter (Signed)
Requested medication (s) are due for refill today: Yes  Requested medication (s) are on the active medication list: Yes  Last refill:  06/15/20  Future visit scheduled: Yes  Notes to clinic:  Unable to refill per protocol, cannot delegate.      Requested Prescriptions  Pending Prescriptions Disp Refills   oxyCODONE (OXY IR/ROXICODONE) 5 MG immediate release tablet 240 tablet 0    Sig: Take 1-2 tablets (5-10 mg total) by mouth every 6 (six) hours as needed for severe pain.      Not Delegated - Analgesics:  Opioid Agonists Failed - 07/15/2020  3:33 PM      Failed - This refill cannot be delegated      Failed - Urine Drug Screen completed in last 360 days      Passed - Valid encounter within last 6 months    Recent Outpatient Visits           3 weeks ago Type 2 diabetes mellitus with diabetic nephropathy, without long-term current use of insulin ([place])   [facility] Birdie Sons, MD   5 months ago Type 2 diabetes mellitus with diabetic nephropathy, with long-term current use of insulin [facility])   [facility] Birdie Sons, MD   11 months ago Type 2 diabetes mellitus with diabetic nephropathy, without long-term current use of insulin [facility])   [facility] Birdie Sons, MD   1 year ago Mixed hyperlipidemia   [facility] Birdie Sons, MD   2 years ago Hypertension secondary to other renal disorders   [facility] Birdie Sons, MD       Future Appointments             In 1 month Gollan, Kathlene November, MD [facility], [facility]   In 2 months Fisher, Kirstie Peri, MD [facility], [place]

## 2020-07-19 MED ORDER — OXYCODONE HCL 5 MG PO TABS: 5.0000 mg | ORAL_TABLET | Freq: Four times a day (QID) | ORAL | 0 refills | Status: DC | PRN

## 2020-07-21 DIAGNOSIS — E1121 Type 2 diabetes mellitus with diabetic nephropathy: Secondary | ICD-10-CM | POA: Diagnosis not present

## 2020-07-21 DIAGNOSIS — N186 End stage renal disease: Secondary | ICD-10-CM | POA: Diagnosis not present

## 2020-07-21 DIAGNOSIS — I10 Essential (primary) hypertension: Secondary | ICD-10-CM | POA: Diagnosis not present

## 2020-07-21 DIAGNOSIS — N2581 Secondary hyperparathyroidism of renal origin: Secondary | ICD-10-CM | POA: Diagnosis not present

## 2020-07-21 DIAGNOSIS — Z794 Long term (current) use of insulin: Secondary | ICD-10-CM | POA: Diagnosis not present

## 2020-07-21 DIAGNOSIS — Z940 Kidney transplant status: Secondary | ICD-10-CM | POA: Diagnosis not present

## 2020-07-22 ENCOUNTER — Other Ambulatory Visit: Payer: Self-pay | Admitting: Family Medicine

## 2020-07-22 DIAGNOSIS — Z1211 Encounter for screening for malignant neoplasm of colon: Secondary | ICD-10-CM

## 2020-07-25 ENCOUNTER — Telehealth: Payer: Self-pay

## 2020-07-25 NOTE — Progress Notes (Deleted)
Chronic Care Management Pharmacy Note  07/25/2020 Name:  James Moreno MRN:  [id number] DOB:  23-Jan-1966  Subjective: James Moreno is an 55 y.o. year old male who is a primary patient of Fisher, Kirstie Peri, MD.  The CCM team was consulted for assistance with disease management and care coordination needs.    Engaged with patient by telephone for follow up visit in response to provider referral for pharmacy case management and/or care coordination services.   Consent to Services:  The patient was given information about Chronic Care Management services, agreed to services, and gave verbal consent prior to initiation of services.  Please see initial visit note for detailed documentation.   Patient Care Team: Birdie Sons, MD as PCP - General (Family Medicine) Rockey Situ Kathlene November, MD as PCP - Cardiology (Cardiology) Ronnald Collum, Lourdes Sledge, MD as Attending Physician (Endocrinology) Pa, [place] (Optometry) Anthonette Legato, MD (Nephrology) Long, Thomes Cake, MD as Referring Physician (Vascular Surgery) Isaias Sakai, MD as Referring Physician (Ophthalmology) Lin Landsman, MD as Consulting Physician (Gastroenterology) Wellington Hampshire, MD as Consulting Physician (Cardiology) Germaine Pomfret, [facility] (Pharmacist)  Recent office visits: 06/20/20: Patient presented to Dr. Caryn Section for follow-up. Chantix stopped, patient started on Nicotine Patch 21 mg daily. Patient not interested in endocrinologist.  02/12/20: Video visit with Dr. Caryn Section for follow-up. Patient given Lantus 30 units daily due to cost .   Recent consult visits: 06/01/20: Patient presented to Dr. Ronnald Collum (Endocrinology) for initial visit.  05/12/20: Patient presented to Laurann Montana, NP for follow-up. Claudication stable.   Hospital visits: None in previous 6 months  Objective:  Lab Results  Component Value Date   CREATININE 1.72 (H) 06/20/2020   BUN 29 (H) 06/20/2020   GFRNONAA 39 (L) 09/17/2019    GFRAA 45 (L) 09/17/2019   NA 139 06/20/2020   K 4.2 06/20/2020   CALCIUM 10.7 (H) 06/20/2020   CO2 21 06/20/2020    Lab Results  Component Value Date/Time   HGBA1C 8.8 06/01/2020 12:00 AM   HGBA1C 9.7 (H) 09/17/2019 02:17 PM   HGBA1C 6.7 12/12/2018 12:00 AM   MICROALBUR 100 12/31/2016 02:13 PM    Last diabetic Eye exam:  Lab Results  Component Value Date/Time   HMDIABEYEEXA Retinopathy (A) 02/15/2020 12:00 AM    Last diabetic Foot exam: No results found for: HMDIABFOOTEX   Lab Results  Component Value Date   CHOL 141 06/20/2020   HDL 46 06/20/2020   LDLCALC 53 06/20/2020   TRIG 268 (H) 06/20/2020   CHOLHDL 3.1 06/20/2020    Hepatic Function Latest Ref Rng & Units 06/20/2020 09/17/2019 12/12/2018  Total Protein 6.0 - 8.5 g/dL - 6.5 -  Albumin 3.8 - 4.9 g/dL 4.2 4.3 -  AST 0 - 40 IU/L - 9 14  ALT 0 - 44 IU/L - 9 16  Alk Phosphatase 48 - 121 IU/L - 131(H) 95  Total Bilirubin 0.0 - 1.2 mg/dL - 0.6 -    Lab Results  Component Value Date/Time   TSH 1.34 12/12/2018 12:00 AM   TSH 1.72 06/13/2006 12:00 AM    CBC Latest Ref Rng & Units 12/12/2018 10/21/2015 06/21/2013  WBC - 6.7 8.9 9.7  Hemoglobin 13.5 - 17.5 14.8 16.3 10.1(L)  Hematocrit 41 - 53 43 46.7 28.9(L)  Platelets 150 - 399 182 140(L) 175    No results found for: VD25OH  Clinical ASCVD: Yes  The 10-year ASCVD risk score Mikey Bussing DC Jr., et al., 2013) is: 20.4%  Values used to calculate the score:     Age: 52 years     Sex: Male     Is Non-Hispanic African American: No     Diabetic: Yes     Tobacco smoker: Yes     Systolic Blood Pressure: 160 mmHg     Is BP treated: Yes     HDL Cholesterol: 46 mg/dL     Total Cholesterol: 141 mg/dL    Depression screen [facility] 2/9 01/19/2020 01/06/2019 01/02/2018  Decreased Interest 0 0 0  Down, Depressed, Hopeless 0 0 0  PHQ - 2 Score 0 0 0  Altered sleeping - - -  Tired, decreased energy - - -  Change in appetite - - -  Feeling bad or failure about yourself  - - -   Trouble concentrating - - -  Moving slowly or fidgety/restless - - -  Suicidal thoughts - - -  PHQ-9 Score - - -  Difficult doing work/chores - - -      Social History   Tobacco Use  Smoking Status Current Every Day Smoker  . Packs/day: 0.75  . Years: 38.00  . Pack years: 28.50  . Types: Cigarettes  Smokeless Tobacco Never Used  Tobacco Comment   since age 37.   BP Readings from Last 3 Encounters:  06/20/20 124/60  05/12/20 110/60  02/22/20 140/70   Pulse Readings from Last 3 Encounters:  06/20/20 91  05/12/20 98  02/22/20 100   Wt Readings from Last 3 Encounters:  06/20/20 222 lb 9.6 oz (101 kg)  05/12/20 223 lb (101.2 kg)  02/22/20 214 lb (97.1 kg)    Assessment/Interventions: Review of patient past medical history, allergies, medications, health status, including review of consultants reports, laboratory and other test data, was performed as part of comprehensive evaluation and provision of chronic care management services.   SDOH:  (Social Determinants of Health) assessments and interventions performed: Yes   CCM Care Plan  Allergies  Allergen Reactions  . No Known Allergies     Medications Reviewed Today    Reviewed by Germaine Pomfret, [facility] (Pharmacist) on 05/19/20 at 1349  Med List Status: <None>  Medication Order Taking? Sig Documenting Provider Last Dose Status Informant  acetaminophen (TYLENOL) 500 MG tablet [id number]  Take 500 mg by mouth every 6 (six) hours as needed (headaches.). [provider]  Active Self  alprazolam Duanne Moron) 2 MG tablet [id number]  TAKE 1 TABLET BY MOUTH THREE TIMES DAILY AS NEEDED FOR SLEEP Birdie Sons, MD  Active   amLODipine (NORVASC) 10 MG tablet [id number]  Take 10 mg by mouth daily.  [provider]  Active   B-D ULTRAFINE III SHORT PEN 31G X 8 MM MISC [id number]   [provider]  Active   BD INSULIN SYRINGE U/F 31G X 5/16" 1 ML [place] [id number]   [provider]  Active   Blood  Glucose Monitoring Suppl ([place]) DEVI [id number]  Frequency:ONCE   Dosage:0.0     Instructions:  Note:Dose: N/A [provider]  Active Self           Med Note Peggye Pitt T   Wed Oct 30, 2017 11:50 AM)    calcitRIOL (ROCALTROL) 0.25 MCG capsule [id number]   [provider]  Active   carvedilol (COREG) 6.25 MG tablet [id number]  TAKE 1 TABLET TWICE A DAY WITH MEALS (NEED APPOINTMENT FOR FURTHER REFILLS) Minna Merritts, MD  Active  Med Note Janan Ridge   Thu May 12, 2020 11:06 AM)    cinacalcet (SENSIPAR) 30 MG tablet [id number]  Take 30 mg by mouth daily. [provider]  Active Self  EDEX 40 MCG injection [id number]  40 mcg by Intracavitary route as needed.  [provider]  Active   fenofibrate (TRICOR) 145 MG tablet [id number]  Take 1 tablet (145 mg total) by mouth daily. Minna Merritts, MD  Active   ibuprofen (ADVIL,MOTRIN) 200 MG tablet [id number]  Take 200-400 mg by mouth every 8 (eight) hours as needed (for headaches.). [provider]  Active   insulin aspart (NOVOLOG) 100 UNIT/ML injection [id number]  Inject 10-15 Units into the skin 3 (three) times daily before meals. [provider]  Active Self       Patient not taking:      Discontinued 05/19/20 1349 (Change in therapy)   insulin glargine (LANTUS SOLOSTAR) 100 UNIT/ML Solostar Pen [id number]  Inject 30 Units into the skin daily. Birdie Sons, MD  Active   Lancets (ONETOUCH DELICA PLUS [id number]) [place] [id number]   [provider]  Active   losartan (COZAAR) 100 MG tablet [id number]  Take 1 tablet (100 mg total) by mouth at bedtime. Rise Mu, PA-C  Active   mycophenolate (CELLCEPT) 500 MG tablet [id number] Yes Take 1,000 mg by mouth 2 (two) times daily. [provider]  Active   naloxone [facility]) nasal spray 4 mg/0.1 mL [id number]  Place 1 spray into the nose once.  [provider]  Active   omeprazole  (PRILOSEC) 20 MG capsule [id number]  Take 2 capsules (40 mg total) by mouth 2 (two) times daily before a meal. Lin Landsman, MD  Active   [facility] An VERIO test strip [id number]   [provider]  Active   oxyCODONE (OXY IR/ROXICODONE) 5 MG immediate release tablet [id number]  Take 1-2 tablets (5-10 mg total) by mouth every 6 (six) hours as needed for severe pain. Birdie Sons, MD  Active   rosuvastatin (CRESTOR) 40 MG tablet [id number]  Take 1 tablet by mouth once daily Minna Merritts, MD  Active   sildenafil (VIAGRA) 100 MG tablet [id number]  TAKE ONE-HALF (1/2) TO ONE TABLET DAILY AS NEEDED FOR ERECTILE DYSFUNCTION Birdie Sons, MD  Active   tacrolimus (PROGRAF) 1 MG capsule [id number]  Take 3 mg by mouth 2 (two) times daily.  [provider]  Active Self           Med Note Kenton Kingfisher, Germaine Pomfret Oct 30, 2017 11:51 AM)    tretinoin (RETIN-A) 0.05 % cream [id number]  Apply topically as needed.  [provider]  Active   triamcinolone cream (KENALOG) 0.1 % [id number]  APPLY DAILY TO INFLAMED BUMPS AS NEEDED [provider]  Active   varenicline (CHANTIX) 1 MG tablet [id number]  Take 1 tablet (1 mg total) by mouth 2 (two) times daily. Minna Merritts, MD  Active   XYOSTED 100 MG/0.5ML Darden Palmer [id number]  0.5 mLs once a week. Every sunday [provider]  Active           Patient Active Problem List   Diagnosis Date Noted  . Proliferative retinopathy of left eye due to diabetes mellitus ([place]) 01/28/2019  . Esophageal dysphagia   . Benign essential hypertension 12/11/2018  . Secondary hyperparathyroidism of renal origin ([place]) 12/11/2018  . Severe tobacco use disorder 08/01/2018  . Atherosclerosis of artery of extremity with ulceration (  [place]) 07/23/2018  . Gastroesophageal reflux disease 05/26/2018  . PAD (peripheral artery disease) ([place]) 10/30/2017  . Chronic ulcer of heel, right, with unspecified severity ([place]) 10/16/2017  . Hepatitis B core  antibody positive 07/03/2017  . Hypertriglyceridemia 07/03/2017  . Chronic, continuous use of opioids 03/28/2016  . Anxiety 03/28/2016  . Pain in surgical scar 11/07/2015  . Bulging eyes 01/24/2015  . Leg mass 01/24/2015  . Renal transplant, status post 01/24/2015  . Carotid arterial disease ([place]) 12/27/2014  . Compulsive tobacco user syndrome 12/27/2014  . Abnormal EKG 04/06/2013  . Erectile dysfunction 11/29/2012  . Obesity 11/28/2012  . Type 2 diabetes mellitus with diabetic nephropathy ([place]) 11/28/2012  . Hypertension 12/21/2011  . Hyperlipidemia 12/21/2011  . 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 10/30/2011  . Obstructive apnea 01/09/2011  . History of other malignant neoplasm of skin 07/16/2006  . Glaucoma 01/13/2006  . Episodic paroxysmal anxiety disorder 03/26/1998    Immunization History  Administered Date(s) Administered  . Influenza Split 12/05/2010  . Influenza,inj,Quad PF,6+ Mos 12/31/2016, 01/27/2019  . PFIZER(Purple Top)SARS-COV-2 Vaccination 06/18/2019, 07/09/2019, 02/22/2020  . Pneumococcal Polysaccharide-23 10/24/2010  . Tdap 12/05/2010    Conditions to be addressed/monitored:  Hypertension, Hyperlipidemia, Diabetes, Coronary Artery Disease, GERD, Anxiety, Tobacco use and History of Renal Transplant   There are no care plans that you recently modified to display for this patient.    Medication Assistance: Application for Tyler Aas, Novolog   medication assistance program. in process.  Anticipated assistance start date 06/22/2020.  See plan of care for additional detail.  Patient's preferred pharmacy is:  [facility] [address] [place] [address] [place] [address] Phone: [phone] Fax: [phone]  [facility] (ESRD Bundle Only) - [place], [address] [place] [address] Phone: [phone] Fax: [place], [place] [address] [place] [address] Phone: [phone] Fax: [place], [place] [address] Phone: [phone] Fax: [phone]  Uses pill box? Yes Pt endorses 100% compliance  We discussed: Current pharmacy is preferred with insurance plan and patient is satisfied with pharmacy services Patient decided to: Continue current medication management strategy  Care Plan and Follow Up Patient Decision:  Patient agrees to Care Plan and Follow-up.  Plan: Telephone follow up appointment with care management team member scheduled for:  06/17/2020 at 1:00 PM   [place] [phone]  Patient Care Plan: General Pharmacy (Adult)    Problem Identified: Hypertension, Hyperlipidemia, Diabetes, Coronary Artery Disease, GERD, Anxiety, Tobacco use and History of Renal Transplant   Priority: High    Long-Range Goal: Patient-Specific Goal   Start Date: 05/19/2020  Expected End Date: 11/16/2020  This Visit's Progress: On track  Priority: High  Note:   Current Barriers:  . Unable to independently afford treatment regimen . Unable to achieve control of Diabetes   Pharmacist Clinical Goal(s):  Marland Kitchen Over the next 90 days, patient will verbalize ability to afford treatment regimen . achieve control of Diabetes as evidenced by A1c less than 7% through collaboration with PharmD and provider.   Interventions: . 1:1 collaboration with Birdie Sons, MD regarding development and update of comprehensive plan of care as evidenced by provider attestation and co-signature . Inter-disciplinary care team collaboration (see longitudinal plan of care) . Comprehensive medication review performed; medication  list updated in electronic medical record  Hypertension (BP goal <130/80) -Controlled -Current treatment: . Amlodipine 10 mg daily  . Carvedilol  6.25 mg twice daily  . Losartan 100 mg daily  -Medications previously tried: NA  -Current home readings: NA -Current dietary habits: NA -Current exercise habits: NA -Denies hypotensive/hypertensive symptoms -Educated on Daily salt intake goal < 2300 mg; Importance of home blood pressure monitoring; -Counseled to monitor BP at home 2-3 times weekly, document, and provide log at future appointments -Recommended to continue current medication  Hyperlipidemia: (LDL goal < 70) -History of PAD, CAD  -Controlled -Current treatment: . Rosuvastatin 40 mg daily  -Medications previously tried: NA  -Educated on Importance of limiting foods high in cholesterol; -Recommended to continue current medication  Diabetes (A1c goal <7%) -Uncontrolled -Current medications: . Novolog 10-16 units three times daily per sliding scale  . Tresiba 30 units daily -Medications previously tried: [place] (switched due to cost)   -Current home glucose readings . fasting glucose: NA . post prandial glucose: NA -Denies hypoglycemic/hyperglycemic symptoms -Educated on Benefits of routine self-monitoring of blood sugar; Continuous glucose monitoring; Patient was interested in seeing if getting CGM through [employer] supplier would be more affordable option for him.   -Counseled to check feet daily and get yearly eye exams -Recommended to continue current medication Assessed patient finances. Patient had not yet received patient assistance for Novolog or [place]. Will reprint and coordinate patient to come into clinic to sign.   - Paperwork for FreeStyle Libre 2 meter + sensor completed, will fax to Dr. Ronnald Collum for review and signature.   Depression/Anxiety (Goal: Maintain stable mood and sleep) -Controlled -Current treatment: . Alprazolam 2 mg three times daily as needed - Sleep  -Medications previously tried/failed: NA -PHQ9: 0 -GAD7: 0 -Educated on Benefits of medication for symptom control Benefits of  cognitive-behavioral therapy with or without medication -Recommended to continue current medication   Renal Transplant  (Goal: prevent rejection of kidney ) -Managed by Dr. Holley Raring  -Controlled -Current treatment  . Mycophenolate 500 mg 2 tablets twice daily  . Tacrolimus 1 mg 3 capsules twice daily  -Medications previously tried: Myfortic (cost) -Patient reports nephrology was able to switch to more affordable option and he will start mycophenolate as soon as he finishes his current supply of Myfortic.   -Recommended to continue current medication  Tobacco use (Goal Quit Smoking) -Controlled -Previous quit attempts: Chantix -Current treatment  . Nicotine 21 mg /24 hr Patch -Patient smokes {Time to first cigarette:23873} -Patient triggers include: boredom  and finishing a meal -On a scale of 1-10, reports MOTIVATION to quit is *** -On a scale of 1-10, reports CONFIDENCE in quitting is *** -Patient set quit date of May 1st. -{CCMPHARMDINTERVENTION:25122}  Down to 15 cigarettes  avoding smoking in house, ashtrays    Patient Goals/Self-Care Activities . Over the next 90 days, patient will:  - check glucose 2-3 times daily , document, and provide at future appointments check blood pressure 2-3 times weekly , document, and provide at future appointments  Follow Up Plan: Telephone follow up appointment with care management team member scheduled for: 06/17/2020 at 1:00 PM

## 2020-07-28 DIAGNOSIS — N189 Chronic kidney disease, unspecified: Secondary | ICD-10-CM | POA: Diagnosis not present

## 2020-07-28 DIAGNOSIS — E1121 Type 2 diabetes mellitus with diabetic nephropathy: Secondary | ICD-10-CM | POA: Diagnosis not present

## 2020-07-28 DIAGNOSIS — E1165 Type 2 diabetes mellitus with hyperglycemia: Secondary | ICD-10-CM | POA: Diagnosis not present

## 2020-07-28 DIAGNOSIS — E785 Hyperlipidemia, unspecified: Secondary | ICD-10-CM | POA: Diagnosis not present

## 2020-07-29 ENCOUNTER — Telehealth: Payer: Self-pay | Admitting: *Deleted

## 2020-07-29 NOTE — Chronic Care Management (AMB) (Signed)
  Care Management   Note  07/29/2020 Name: James Moreno MRN: [id number] DOB: 04-15-1965  James Moreno is a 55 y.o. year old male who is a primary care patient of Caryn Section, Kirstie Peri, MD and is actively engaged with the care management team. I reached out to [name] by phone today to assist with re-scheduling a follow up visit with the Pharmacist.  Follow up plan: Unsuccessful telephone outreach attempt made. A HIPAA compliant phone message was left for the patient providing contact information and requesting a return call. The care management team will reach out to the patient again over the next 7 days. If patient returns call to provider office, please advise to call Sebastian at [phone].  [place] Management

## 2020-08-02 NOTE — Chronic Care Management (AMB) (Signed)
  Care Management   Note  08/02/2020 Name: NAM VOSSLER MRN: [id number] DOB: Jan 16, 1966  OLIVIER FRAYRE is a 55 y.o. year old male who is a primary care patient of Caryn Section, Kirstie Peri, MD and is actively engaged with the care management team. I reached out to [name] by phone today to assist with scheduling a follow up visit with the Pharmacist.  Follow up plan: Telephone appointment with care management team member scheduled for:08/15/2020  [place], [place] Management  Direct Dial: [phone]

## 2020-08-04 DIAGNOSIS — I10 Essential (primary) hypertension: Secondary | ICD-10-CM | POA: Diagnosis not present

## 2020-08-04 DIAGNOSIS — N2581 Secondary hyperparathyroidism of renal origin: Secondary | ICD-10-CM | POA: Diagnosis not present

## 2020-08-04 DIAGNOSIS — I7389 Other specified peripheral vascular diseases: Secondary | ICD-10-CM | POA: Diagnosis not present

## 2020-08-04 DIAGNOSIS — E669 Obesity, unspecified: Secondary | ICD-10-CM | POA: Diagnosis not present

## 2020-08-04 DIAGNOSIS — E0859 Diabetes mellitus due to underlying condition with other circulatory complications: Secondary | ICD-10-CM | POA: Diagnosis not present

## 2020-08-04 DIAGNOSIS — E08311 Diabetes mellitus due to underlying condition with unspecified diabetic retinopathy with macular edema: Secondary | ICD-10-CM | POA: Diagnosis not present

## 2020-08-04 DIAGNOSIS — N189 Chronic kidney disease, unspecified: Secondary | ICD-10-CM | POA: Diagnosis not present

## 2020-08-04 DIAGNOSIS — E1165 Type 2 diabetes mellitus with hyperglycemia: Secondary | ICD-10-CM | POA: Diagnosis not present

## 2020-08-04 DIAGNOSIS — E1121 Type 2 diabetes mellitus with diabetic nephropathy: Secondary | ICD-10-CM | POA: Diagnosis not present

## 2020-08-12 ENCOUNTER — Other Ambulatory Visit: Payer: Self-pay | Admitting: Family Medicine

## 2020-08-12 ENCOUNTER — Telehealth: Payer: Self-pay

## 2020-08-12 DIAGNOSIS — R52 Pain, unspecified: Secondary | ICD-10-CM

## 2020-08-12 DIAGNOSIS — L905 Scar conditions and fibrosis of skin: Secondary | ICD-10-CM

## 2020-08-12 DIAGNOSIS — M79604 Pain in right leg: Secondary | ICD-10-CM

## 2020-08-12 NOTE — Telephone Encounter (Signed)
Copied from [place] [id number]. Topic: Quick Communication - Rx Refill/Question >> Aug 12, 2020 12:24 PM Tessa Lerner A wrote: Medication: oxyCODONE (OXY IR/ROXICODONE) 5 MG immediate release tablet   Has the patient contacted their pharmacy? No. Patient has not contacted their pharmacy.   Preferred Pharmacy (with phone number or street name): [facility], [place]  Phone:  [phone] Fax:  [phone]  Agent: Please be advised that RX refills may take up to 3 business days. We ask that you follow-up with your pharmacy.

## 2020-08-12 NOTE — Telephone Encounter (Signed)
30 day supply dispensed 07/19/2020

## 2020-08-12 NOTE — Progress Notes (Signed)
Spoke to patient to confirmed patient telephone appointment on 08/15/2020 for CCM at 3:30 pm with [name] the Clinical pharmacist.   Patient Verbalized understanding and denies any side effects from any of his current medications  [place] Pharmacist Assistant [phone]

## 2020-08-12 NOTE — Telephone Encounter (Signed)
Requested medication (s) are due for refill today: Yes  Requested medication (s) are on the active medication list: Yes  Last refill:  07/19/20  Future visit scheduled: Yes  Notes to clinic:  Unable to refill per protocol, cannot delgate.      Requested Prescriptions  Pending Prescriptions Disp Refills   oxyCODONE (OXY IR/ROXICODONE) 5 MG immediate release tablet 240 tablet 0    Sig: Take 1-2 tablets (5-10 mg total) by mouth every 6 (six) hours as needed for severe pain.      Not Delegated - Analgesics:  Opioid Agonists Failed - 08/12/2020  1:47 PM      Failed - This refill cannot be delegated      Failed - Urine Drug Screen completed in last 360 days      Passed - Valid encounter within last 6 months    Recent Outpatient Visits           1 month ago Type 2 diabetes mellitus with diabetic nephropathy, without long-term current use of insulin ([place])   [facility] Birdie Sons, MD   6 months ago Type 2 diabetes mellitus with diabetic nephropathy, with long-term current use of insulin ([place])   [facility] Birdie Sons, MD   1 year ago Type 2 diabetes mellitus with diabetic nephropathy, without long-term current use of insulin [facility])   [facility] Birdie Sons, MD   1 year ago Mixed hyperlipidemia   [facility] Birdie Sons, MD   2 years ago Hypertension secondary to other renal disorders   [facility] Birdie Sons, MD       Future Appointments             In 1 week Gollan, Kathlene November, MD [facility]   In 1 month Fisher, Kirstie Peri, MD [facility], [place]

## 2020-08-15 ENCOUNTER — Other Ambulatory Visit: Payer: Self-pay | Admitting: Family Medicine

## 2020-08-15 ENCOUNTER — Ambulatory Visit (INDEPENDENT_AMBULATORY_CARE_PROVIDER_SITE_OTHER): Payer: Medicare HMO

## 2020-08-15 DIAGNOSIS — N1831 Chronic kidney disease, stage 3a: Secondary | ICD-10-CM | POA: Diagnosis not present

## 2020-08-15 DIAGNOSIS — J301 Allergic rhinitis due to pollen: Secondary | ICD-10-CM

## 2020-08-15 DIAGNOSIS — E1169 Type 2 diabetes mellitus with other specified complication: Secondary | ICD-10-CM

## 2020-08-15 DIAGNOSIS — I152 Hypertension secondary to endocrine disorders: Secondary | ICD-10-CM | POA: Diagnosis not present

## 2020-08-15 DIAGNOSIS — E1159 Type 2 diabetes mellitus with other circulatory complications: Secondary | ICD-10-CM

## 2020-08-15 DIAGNOSIS — E785 Hyperlipidemia, unspecified: Secondary | ICD-10-CM | POA: Diagnosis not present

## 2020-08-15 DIAGNOSIS — Z794 Long term (current) use of insulin: Secondary | ICD-10-CM

## 2020-08-15 DIAGNOSIS — E1140 Type 2 diabetes mellitus with diabetic neuropathy, unspecified: Secondary | ICD-10-CM

## 2020-08-15 DIAGNOSIS — Z720 Tobacco use: Secondary | ICD-10-CM

## 2020-08-15 DIAGNOSIS — R52 Pain, unspecified: Secondary | ICD-10-CM

## 2020-08-15 DIAGNOSIS — M79604 Pain in right leg: Secondary | ICD-10-CM

## 2020-08-15 DIAGNOSIS — L905 Scar conditions and fibrosis of skin: Secondary | ICD-10-CM

## 2020-08-15 MED ORDER — OXYCODONE HCL 5 MG PO TABS: 5.0000 mg | ORAL_TABLET | Freq: Four times a day (QID) | ORAL | 0 refills | Status: DC | PRN

## 2020-08-15 NOTE — Telephone Encounter (Signed)
Patient is calling again to get his prescription before he goes out of town.  Please advise.

## 2020-08-15 NOTE — Telephone Encounter (Signed)
Medication: oxyCODONE (OXY IR/ROXICODONE) 5 MG immediate release tablet [[id number]] - Pt is calling to see if the medication can be sent to the below pharmacy  Has the patient contacted their pharmacy? YES  (Agent: If no, request that the patient contact the pharmacy for the refill.) (Agent: If yes, when and what did the pharmacy advise?)  Preferred Pharmacy (with phone number or street name): [facility] [address] [place] [address] [phone]  Agent: Please be advised that RX refills may take up to 3 business days. We ask that you follow-up with your pharmacy.  Pt is requesting a call back when this is completed.

## 2020-08-15 NOTE — Telephone Encounter (Signed)
Received a fax from [place] on [address] stating to please send to [facility] they can't get the medication

## 2020-08-15 NOTE — Progress Notes (Signed)
Chronic Care Management Pharmacy Note  08/19/2020 Name:  James Moreno MRN:  [id number] DOB:  [birth year]  Subjective: James Moreno is an 55 y.o. year old male who is a primary patient of Fisher, Kirstie Peri, MD.  The CCM team was consulted for assistance with disease management and care coordination needs.    Engaged with patient by telephone for follow up visit in response to provider referral for pharmacy case management and/or care coordination services.   Consent to Services:  The patient was given information about Chronic Care Management services, agreed to services, and gave verbal consent prior to initiation of services.  Please see initial visit note for detailed documentation.   Patient Care Team: Birdie Sons, MD as PCP - General (Family Medicine) Rockey Situ Kathlene November, MD as PCP - Cardiology (Cardiology) Ronnald Collum, Lourdes Sledge, MD as Attending Physician (Endocrinology) Pa, [place] (Optometry) Anthonette Legato, MD (Nephrology) Long, Thomes Cake, MD as Referring Physician (Vascular Surgery) Isaias Sakai, MD as Referring Physician (Ophthalmology) Lin Landsman, MD as Consulting Physician (Gastroenterology) Wellington Hampshire, MD as Consulting Physician (Cardiology) Germaine Pomfret, [facility] (Pharmacist)  Recent office visits: 06/20/20: Patient presented to Dr. Caryn Section for follow-up. Chantix stopped, patient started on Nicotine Patch 21 mg daily.  02/12/20: Video visit with Dr. Caryn Section for follow-up. Patient given Lantus 30 units daily due to cost .   Recent consult visits: 07/21/20: Patient presented to Dr. Holley Raring (Nephrology) for follow-up.  06/01/20: Patient presented to Dr. Ronnald Collum (Endocrinology) for initial visit.  05/12/20: Patient presented to Laurann Montana, NP for follow-up. Claudication stable.   Hospital visits: None in previous 6 months  Objective:  Lab Results  Component Value Date   CREATININE 1.72 (H) 06/20/2020   BUN 29 (H) 06/20/2020    GFRNONAA 39 (L) 09/17/2019   GFRAA 45 (L) 09/17/2019   NA 139 06/20/2020   K 4.2 06/20/2020   CALCIUM 10.7 (H) 06/20/2020   CO2 21 06/20/2020    Lab Results  Component Value Date/Time   HGBA1C 8.8 05/25/2020 12:00 AM   HGBA1C 9.7 (H) 09/17/2019 02:17 PM   HGBA1C 6.7 12/12/2018 12:00 AM   MICROALBUR 100 12/31/2016 02:13 PM    Last diabetic Eye exam:  Lab Results  Component Value Date/Time   HMDIABEYEEXA Retinopathy (A) 02/15/2020 12:00 AM    Last diabetic Foot exam: No results found for: HMDIABFOOTEX   Lab Results  Component Value Date   CHOL 141 06/20/2020   HDL 46 06/20/2020   LDLCALC 53 06/20/2020   TRIG 268 (H) 06/20/2020   CHOLHDL 3.1 06/20/2020    Hepatic Function Latest Ref Rng & Units 06/20/2020 09/17/2019 12/12/2018  Total Protein 6.0 - 8.5 g/dL - 6.5 -  Albumin 3.8 - 4.9 g/dL 4.2 4.3 -  AST 0 - 40 IU/L - 9 14  ALT 0 - 44 IU/L - 9 16  Alk Phosphatase 48 - 121 IU/L - 131(H) 95  Total Bilirubin 0.0 - 1.2 mg/dL - 0.6 -    Lab Results  Component Value Date/Time   TSH 1.34 12/12/2018 12:00 AM   TSH 1.72 06/13/2006 12:00 AM    CBC Latest Ref Rng & Units 12/12/2018 10/21/2015 06/21/2013  WBC - 6.7 8.9 9.7  Hemoglobin 13.5 - 17.5 14.8 16.3 10.1(L)  Hematocrit 41 - 53 43 46.7 28.9(L)  Platelets 150 - 399 182 140(L) 175    No results found for: VD25OH  Clinical ASCVD: Yes  The 10-year ASCVD risk score Mikey Bussing DC Jr.,  et al., 2013) is: 20.4%   Values used to calculate the score:     Age: 66 years     Sex: Male     Is Non-Hispanic African American: No     Diabetic: Yes     Tobacco smoker: Yes     Systolic Blood Pressure: 998 mmHg     Is BP treated: Yes     HDL Cholesterol: 46 mg/dL     Total Cholesterol: 141 mg/dL    Depression screen [facility] 2/9 01/19/2020 01/06/2019 01/02/2018  Decreased Interest 0 0 0  Down, Depressed, Hopeless 0 0 0  PHQ - 2 Score 0 0 0  Altered sleeping - - -  Tired, decreased energy - - -  Change in appetite - - -  Feeling bad or  failure about yourself  - - -  Trouble concentrating - - -  Moving slowly or fidgety/restless - - -  Suicidal thoughts - - -  PHQ-9 Score - - -  Difficult doing work/chores - - -  Some recent data might be hidden      Social History   Tobacco Use  Smoking Status Current Every Day Smoker  . Packs/day: 0.75  . Years: 38.00  . Pack years: 28.50  . Types: Cigarettes  Smokeless Tobacco Never Used  Tobacco Comment   since age 50.   BP Readings from Last 3 Encounters:  06/20/20 124/60  05/12/20 110/60  02/22/20 140/70   Pulse Readings from Last 3 Encounters:  06/20/20 91  05/12/20 98  02/22/20 100   Wt Readings from Last 3 Encounters:  06/20/20 222 lb 9.6 oz (101 kg)  05/12/20 223 lb (101.2 kg)  02/22/20 214 lb (97.1 kg)    Assessment/Interventions: Review of patient past medical history, allergies, medications, health status, including review of consultants reports, laboratory and other test data, was performed as part of comprehensive evaluation and provision of chronic care management services.   SDOH:  (Social Determinants of Health) assessments and interventions performed: Yes SDOH Interventions   Flowsheet Row Most Recent Value  SDOH Interventions   Financial Strain Interventions Intervention Not Indicated      CCM Care Plan  Allergies  Allergen Reactions  . No Known Allergies     Medications Reviewed Today    Reviewed by Germaine Pomfret, [facility] (Pharmacist) on 05/19/20 at 1349  Med List Status: <None>  Medication Order Taking? Sig Documenting Provider Last Dose Status Informant  acetaminophen (TYLENOL) 500 MG tablet [id number]  Take 500 mg by mouth every 6 (six) hours as needed (headaches.). [provider]  Active Self  alprazolam Duanne Moron) 2 MG tablet [id number]  TAKE 1 TABLET BY MOUTH THREE TIMES DAILY AS NEEDED FOR SLEEP Birdie Sons, MD  Active   amLODipine (NORVASC) 10 MG tablet [id number]  Take 10 mg by mouth daily.  [provider]   Active   B-D ULTRAFINE III SHORT PEN 31G X 8 MM MISC [id number]   [provider]  Active   BD INSULIN SYRINGE U/F 31G X 5/16" 1 ML [place] [id number]   [provider]  Active   Blood Glucose Monitoring Suppl ([place]) DEVI [id number]  Frequency:ONCE   Dosage:0.0     Instructions:  Note:Dose: N/A [provider]  Active Self           Med Note Peggye Pitt T   Wed Oct 30, 2017 11:50 AM)    calcitRIOL (ROCALTROL) 0.25 MCG capsule [id number]   [provider]  Active   carvedilol (COREG) 6.25 MG tablet [id number]  TAKE 1 TABLET TWICE A DAY WITH MEALS (NEED APPOINTMENT FOR FURTHER REFILLS) Minna Merritts, MD  Active            Med Note Tyrone Sage May 12, 2020 11:06 AM)    cinacalcet (SENSIPAR) 30 MG tablet [id number]  Take 30 mg by mouth daily. [provider]  Active Self  EDEX 40 MCG injection [id number]  40 mcg by Intracavitary route as needed.  [provider]  Active   fenofibrate (TRICOR) 145 MG tablet [id number]  Take 1 tablet (145 mg total) by mouth daily. Minna Merritts, MD  Active   ibuprofen (ADVIL,MOTRIN) 200 MG tablet [id number]  Take 200-400 mg by mouth every 8 (eight) hours as needed (for headaches.). [provider]  Active   insulin aspart (NOVOLOG) 100 UNIT/ML injection [id number]  Inject 10-15 Units into the skin 3 (three) times daily before meals. [provider]  Active Self       Patient not taking:      Discontinued 05/19/20 1349 (Change in therapy)   insulin glargine (LANTUS SOLOSTAR) 100 UNIT/ML Solostar Pen [id number]  Inject 30 Units into the skin daily. Birdie Sons, MD  Active   Lancets (ONETOUCH DELICA PLUS [id number]) [place] [id number]   [provider]  Active   losartan (COZAAR) 100 MG tablet [id number]  Take 1 tablet (100 mg total) by mouth at bedtime. Rise Mu, PA-C  Active   mycophenolate (CELLCEPT) 500 MG tablet [id number] Yes Take 1,000 mg by  mouth 2 (two) times daily. [provider]  Active   naloxone [facility]) nasal spray 4 mg/0.1 mL [id number]  Place 1 spray into the nose once.  [provider]  Active   omeprazole (PRILOSEC) 20 MG capsule [id number]  Take 2 capsules (40 mg total) by mouth 2 (two) times daily before a meal. Lin Landsman, MD  Active   [facility] VERIO test strip [id number]   [provider]  Active   oxyCODONE (OXY IR/ROXICODONE) 5 MG immediate release tablet [id number]  Take 1-2 tablets (5-10 mg total) by mouth every 6 (six) hours as needed for severe pain. Birdie Sons, MD  Active   rosuvastatin (CRESTOR) 40 MG tablet [id number]  Take 1 tablet by mouth once daily Minna Merritts, MD  Active   sildenafil (VIAGRA) 100 MG tablet [id number]  TAKE ONE-HALF (1/2) TO ONE TABLET DAILY AS NEEDED FOR ERECTILE DYSFUNCTION Birdie Sons, MD  Active   tacrolimus (PROGRAF) 1 MG capsule [id number]  Take 3 mg by mouth 2 (two) times daily.  [provider]  Active Self           Med Note Kenton Kingfisher, Germaine Pomfret Oct 30, 2017 11:51 AM)    tretinoin (RETIN-A) 0.05 % cream [id number]  Apply topically as needed.  [provider]  Active   triamcinolone cream (KENALOG) 0.1 % [id number]  APPLY DAILY TO INFLAMED BUMPS AS NEEDED [provider]  Active   varenicline (CHANTIX) 1 MG tablet [id number]  Take 1 tablet (1 mg total) by mouth 2 (two) times daily. Minna Merritts, MD  Active   XYOSTED 100 MG/0.5ML D[place] Palmer [id number]  0.5 mLs once a week. Every sunday [provider]  Active           Patient Active Problem List   Diagnosis Date Noted  . Proliferative retinopathy of left  eye due to diabetes mellitus ([place]) 01/28/2019  . Esophageal dysphagia   . Benign essential hypertension 12/11/2018  . Secondary hyperparathyroidism of renal origin ([place]) 12/11/2018  . Severe tobacco use disorder 08/01/2018  . Atherosclerosis of artery of extremity with ulceration ([place]) 07/23/2018   . Gastroesophageal reflux disease 05/26/2018  . PAD (peripheral artery disease) ([place]) 10/30/2017  . Chronic ulcer of heel, right, with unspecified severity ([place]) 10/16/2017  . Hepatitis B core antibody positive 07/03/2017  . Hypertriglyceridemia 07/03/2017  . Chronic, continuous use of opioids 03/28/2016  . Anxiety 03/28/2016  . Pain in surgical scar 11/07/2015  . Bulging eyes 01/24/2015  . Leg mass 01/24/2015  . Renal transplant, status post 01/24/2015  . Carotid arterial disease ([place]) 12/27/2014  . Compulsive tobacco user syndrome 12/27/2014  . Abnormal EKG 04/06/2013  . Erectile dysfunction 11/29/2012  . Obesity 11/28/2012  . Type 2 diabetes mellitus with diabetic nephropathy ([place]) 11/28/2012  . Hypertension 12/21/2011  . Hyperlipidemia 12/21/2011  . 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 10/30/2011  . Obstructive apnea 01/09/2011  . History of other malignant neoplasm of skin 07/16/2006  . Glaucoma 01/13/2006  . Episodic paroxysmal anxiety disorder 03/26/1998    Immunization History  Administered Date(s) Administered  . Influenza Split 12/05/2010  . Influenza,inj,Quad PF,6+ Mos 12/31/2016, 01/27/2019  . PFIZER(Purple Top)SARS-COV-2 Vaccination 06/18/2019, 07/09/2019, 02/22/2020  . Pneumococcal Polysaccharide-23 10/24/2010  . Tdap 12/05/2010    Conditions to be addressed/monitored:  Hypertension, Hyperlipidemia, Diabetes, Coronary Artery Disease, GERD, Anxiety, Tobacco use and History of Renal Transplant   Care Plan : General Pharmacy (Adult)  Updates made by Germaine Pomfret, RPH since 08/19/2020 12:00 AM    Problem: Hypertension, Hyperlipidemia, Diabetes, Coronary Artery Disease, GERD, Anxiety, Tobacco use and History of Renal Transplant   Priority: High    Long-Range Goal: Patient-Specific Goal   Start Date: 05/19/2020  Expected End Date: 11/16/2020  This Visit's Progress: On track  Recent Progress: On track  Priority: High  Note:   Current Barriers:   . Unable to independently afford treatment regimen . Unable to achieve control of Diabetes   Pharmacist Clinical Goal(s):  Marland Kitchen Over the next 90 days, patient will verbalize ability to afford treatment regimen . achieve control of Diabetes as evidenced by A1c less than 7% through collaboration with PharmD and provider.   Interventions: . 1:1 collaboration with Birdie Sons, MD regarding development and update of comprehensive plan of care as evidenced by provider attestation and co-signature . Inter-disciplinary care team collaboration (see longitudinal plan of care) . Comprehensive medication review performed; medication list updated in electronic medical record  Hypertension (BP goal <130/80) -Controlled -Current treatment: . Amlodipine 10 mg daily  . Carvedilol 6.25 mg twice daily  . Losartan 100 mg daily  -Medications previously tried: NA  -Current home readings: NA -Denies hypotensive/hypertensive symptoms -Educated on Daily salt intake goal < 2300 mg; Importance of home blood pressure monitoring; -Counseled to monitor BP at home 2-3 times weekly, document, and provide log at future appointments -Recommended to continue current medication  Hyperlipidemia: (LDL goal < 70) -History of PAD, CAD  -Controlled -Current treatment: . Rosuvastatin 40 mg daily  -Medications previously tried: NA  -Educated on Importance of limiting foods high in cholesterol; -Recommended to continue current medication  Diabetes (A1c goal <7%) -Uncontrolled -Current medications: . Novolog 15 units three times daily  . Tresiba 30 units daily -Medications previously tried: [place] (switched due to cost)   -Will administer 5 units Novolog if BG > 200 even if  not eating  -Current home glucose readings . fasting glucose: NA . post prandial glucose: NA -Reports hypoglycemic symptoms: Patient had low blood sugar due to giving multiple Novolog injections after seeing his blood sugar was 400.   -Current dietary habits: eating one big meal daily, trying to cut down on carbohydrates, eating more vegetables,salads. -Current exercise habits: No formal exercise, is limited by pain, allergies. -Counseled patient to avoid "Dose-Stacking" and limit Novolog administration to at least 4 hours apart.  -Patient consented to sharing CGM data. Libreview account created for patient.  -Recommended to continue current medication  Depression/Anxiety (Goal: Maintain stable mood and sleep) -Controlled -Current treatment: . Alprazolam 2 mg three times daily as needed - Sleep  -Medications previously tried/failed: NA -PHQ9: 0 -GAD7: 0 -Educated on Benefits of medication for symptom control Benefits of cognitive-behavioral therapy with or without medication -Recommended to continue current medication   Renal Transplant  (Goal: prevent rejection of kidney ) -Managed by Dr. Holley Raring  -Controlled -Current treatment  . Mycophenolate 500 mg 2 tablets twice daily  . Tacrolimus 1 mg 3 capsules twice daily  -Medications previously tried: Myfortic (cost) -Drug interactions evaluated, no significant drug interactions noted with current regimen.  -Recommended to continue current medication  Tobacco use (Goal Quit Smoking) -Controlled -Previous quit attempts: Chantix -Current treatment  . Nicotine 21 mg /24 hr Patch -Patient smokes Within 30 minutes of waking -Patient triggers include: boredom  and finishing a meal -Patient previously set quit date of May 1st, but had been unable to reach his goal. He has had an increase in stress in his life due to his worsening allergy symptoms and the health of his partner.   -Counseled on identifying alternative stress management tools such as walking, reading or exercising   Allergic Rhinitis (Goal: Minimize symptoms) -Not ideally controlled -Current treatment  . None -Medications previously tried: Flonase (ineffective), Zyrtec (ineffective) -Symptoms  significantly worse. Patient reports itchy eyes, congestion.  -Recommended claritin 10 mg daily  Chronic Kidney Disease Stage 3a  -All medications assessed for renal dosing and appropriateness in chronic kidney disease. -Recommended to continue current medication  Patient Goals/Self-Care Activities . Over the next 90 days, patient will:  - check glucose 2-3 times daily , document, and provide at future appointments -check blood pressure 2-3 times weekly , document, and provide at future appointments -decrease cigarette use   Follow Up Plan: Telephone follow up appointment with care management team member scheduled for:  10/17/2020 at 3:00 PM       Medication Assistance: Application for Tyler Aas, Novolog   medication assistance program. in process.  Anticipated assistance start date 06/22/2020.  See plan of care for additional detail.  Patient's preferred pharmacy is:  [facility] [address] [place] [address] [place] [address] Phone: [phone] Fax: [phone]  [facility] ([facility] Bundle Only) - [place], [address] [place] [address] Phone: [phone] Fax: [place], [place] [address] [address] Phone: [phone] Fax: [facility] [place], [place] [address] Phone: [phone] Fax: [phone]  [facility] [address]), [place] - [place]) [place] [address] Phone: [phone] Fax: [phone]  Uses pill box? Yes Pt endorses 100% compliance  We discussed: Current pharmacy is preferred with insurance  plan and patient is satisfied with pharmacy services Patient decided to: Continue current medication management strategy  Care Plan and Follow Up Patient Decision:  Patient  agrees to Care Plan and Follow-up.  Plan: Telephone follow up appointment with care management team member scheduled for:  10/17/2020 at 3:00 PM   [place] [phone]

## 2020-08-15 NOTE — Telephone Encounter (Signed)
Requested medication (s) are due for refill today: Yes  Requested medication (s) are on the active medication list: Yes  Last refill:  08/15/20  Future visit scheduled: Yes  Notes to clinic:  Resend to another pharmacy     Requested Prescriptions  Pending Prescriptions Disp Refills   oxyCODONE (OXY IR/ROXICODONE) 5 MG immediate release tablet 240 tablet 0    Sig: Take 1-2 tablets (5-10 mg total) by mouth every 6 (six) hours as needed for severe pain.      Not Delegated - Analgesics:  Opioid Agonists Failed - 08/15/2020  2:07 PM      Failed - This refill cannot be delegated      Failed - Urine Drug Screen completed in last 360 days      Passed - Valid encounter within last 6 months    Recent Outpatient Visits           1 month ago Type 2 diabetes mellitus with diabetic nephropathy, without long-term current use of insulin ([place])   [facility] Birdie Sons, MD   6 months ago Type 2 diabetes mellitus with diabetic nephropathy, with long-term current use of insulin ([place])   [facility] Birdie Sons, MD   1 year ago Type 2 diabetes mellitus with diabetic nephropathy, without long-term current use of insulin [facility])   [facility] Birdie Sons, MD   1 year ago Mixed hyperlipidemia   [facility] Birdie Sons, MD   2 years ago Hypertension secondary to other renal disorders   [facility] Birdie Sons, MD       Future Appointments             In 1 week Gollan, Kathlene November, MD [facility]   In 1 month Fisher, Kirstie Peri, MD [facility], [place]

## 2020-08-16 MED ORDER — OXYCODONE HCL 5 MG PO TABS: 5.0000 mg | ORAL_TABLET | Freq: Four times a day (QID) | ORAL | 0 refills | Status: DC | PRN

## 2020-08-18 ENCOUNTER — Encounter: Payer: Self-pay | Admitting: Family Medicine

## 2020-08-19 NOTE — Patient Instructions (Signed)
Visit Information It was great speaking with you today!  Please let me know if you have any questions about our visit.  Goals Addressed            This Visit's Progress   . Monitor and Manage My Blood Sugar-Diabetes Type 2   On track    Timeframe:  Long-Range Goal Priority:  High Start Date: 05/17/2020                            Expected End Date: 11/16/2020                       Follow Up Date 06/16/2020    - check blood sugar at prescribed times - check blood sugar if I feel it is too high or too low - enter blood sugar readings and medication or insulin into daily log    Why is this important?    Checking your blood sugar at home helps to keep it from getting very high or very low.   Writing the results in a diary or log helps the doctor know how to care for you.   Your blood sugar log should have the time, date and the results.   Also, write down the amount of insulin or other medicine that you take.   Other information, like what you ate, exercise done and how you were feeling, will also be helpful.     Notes:     . Quit Smoking   Not on track    Recommend to continue efforts to reduce smoking habits until no longer smoking (Smoking Cessation literature attached to AVS).           Patient Care Plan: General Pharmacy (Adult)    Problem Identified: Hypertension, Hyperlipidemia, Diabetes, Coronary Artery Disease, GERD, Anxiety, Tobacco use and History of Renal Transplant   Priority: High    Long-Range Goal: Patient-Specific Goal   Start Date: 05/19/2020  Expected End Date: 11/16/2020  This Visit's Progress: On track  Recent Progress: On track  Priority: High  Note:   Current Barriers:  . Unable to independently afford treatment regimen . Unable to achieve control of Diabetes   Pharmacist Clinical Goal(s):  Marland Kitchen Over the next 90 days, patient will verbalize ability to afford treatment regimen . achieve control of Diabetes as evidenced by A1c less than 7% through  collaboration with PharmD and provider.   Interventions: . 1:1 collaboration with Birdie Sons, MD regarding development and update of comprehensive plan of care as evidenced by provider attestation and co-signature . Inter-disciplinary care team collaboration (see longitudinal plan of care) . Comprehensive medication review performed; medication list updated in electronic medical record  Hypertension (BP goal <130/80) -Controlled -Current treatment: . Amlodipine 10 mg daily  . Carvedilol 6.25 mg twice daily  . Losartan 100 mg daily  -Medications previously tried: NA  -Current home readings: NA -Denies hypotensive/hypertensive symptoms -Educated on Daily salt intake goal < 2300 mg; Importance of home blood pressure monitoring; -Counseled to monitor BP at home 2-3 times weekly, document, and provide log at future appointments -Recommended to continue current medication  Hyperlipidemia: (LDL goal < 70) -History of PAD, CAD  -Controlled -Current treatment: . Rosuvastatin 40 mg daily  -Medications previously tried: NA  -Educated on Importance of limiting foods high in cholesterol; -Recommended to continue current medication  Diabetes (A1c goal <7%) -Uncontrolled -Current medications: . Novolog 15 units three times  daily  . Tyler Aas 30 units daily -Medications previously tried: [place] (switched due to cost)   -Will administer 5 units Novolog if BG > 200 even if not eating  -Current home glucose readings . fasting glucose: NA . post prandial glucose: NA -Reports hypoglycemic symptoms: Patient had low blood sugar due to giving multiple Novolog injections after seeing his blood sugar was 400.  -Current dietary habits: eating one big meal daily, trying to cut down on carbohydrates, eating more vegetables,salads. -Current exercise habits: No formal exercise, is limited by pain, allergies. -Counseled patient to avoid "Dose-Stacking" and limit Novolog administration to at least 4  hours apart.  -Patient consented to sharing CGM data. Libreview account created for patient.  -Recommended to continue current medication  Depression/Anxiety (Goal: Maintain stable mood and sleep) -Controlled -Current treatment: . Alprazolam 2 mg three times daily as needed - Sleep  -Medications previously tried/failed: NA -PHQ9: 0 -GAD7: 0 -Educated on Benefits of medication for symptom control Benefits of cognitive-behavioral therapy with or without medication -Recommended to continue current medication   Renal Transplant  (Goal: prevent rejection of kidney ) -Managed by Dr. Holley Raring  -Controlled -Current treatment  . Mycophenolate 500 mg 2 tablets twice daily  . Tacrolimus 1 mg 3 capsules twice daily  -Medications previously tried: Myfortic (cost) -Drug interactions evaluated, no significant drug interactions noted with current regimen.  -Recommended to continue current medication  Tobacco use (Goal Quit Smoking) -Controlled -Previous quit attempts: Chantix -Current treatment  . Nicotine 21 mg /24 hr Patch -Patient smokes Within 30 minutes of waking -Patient triggers include: boredom  and finishing a meal -Patient previously set quit date of May 1st, but had been unable to reach his goal. He has had an increase in stress in his life due to his worsening allergy symptoms and the health of his partner.   -Counseled on identifying alternative stress management tools such as walking, reading or exercising   Allergic Rhinitis (Goal: Minimize symptoms) -Not ideally controlled -Current treatment  . None -Medications previously tried: Flonase (ineffective), Zyrtec (ineffective) -Symptoms significantly worse. Patient reports itchy eyes, congestion.  -Recommended claritin 10 mg daily  Chronic Kidney Disease Stage 3a  -All medications assessed for renal dosing and appropriateness in chronic kidney disease. -Recommended to continue current medication  Patient Goals/Self-Care  Activities . Over the next 90 days, patient will:  - check glucose 2-3 times daily , document, and provide at future appointments -check blood pressure 2-3 times weekly , document, and provide at future appointments -decrease cigarette use   Follow Up Plan: Telephone follow up appointment with care management team member scheduled for:  10/17/2020 at 3:00 PM       Patient agreed to services and verbal consent obtained.   The patient verbalized understanding of instructions, educational materials, and care plan provided today and declined offer to receive copy of patient instructions, educational materials, and care plan.   [name], PharmD, [place] [phone]

## 2020-08-20 DIAGNOSIS — Z794 Long term (current) use of insulin: Secondary | ICD-10-CM | POA: Diagnosis not present

## 2020-08-20 DIAGNOSIS — E1121 Type 2 diabetes mellitus with diabetic nephropathy: Secondary | ICD-10-CM | POA: Diagnosis not present

## 2020-08-22 NOTE — Progress Notes (Deleted)
Evaluation Performed:  Follow-up visit  Date:  08/22/2020   ID:  James Moreno 22-Apr-1965, MRN [id number]  Patient Location:  [place] [address]   Provider location:   [facility], [place] office  PCP:  Birdie Sons, MD  Cardiologist:  [facility]  No chief complaint on file.    History of Present Illness:    James Moreno is a 55 y.o. male  past medical history of diabetes,  previously on peritoneal hemodialysis,   kidney transplant April 2016 in [place],  hypertension ,  long smoking history who continues to smoke,  Bilateral carotid disease, 50% LE arterial disease evaluated for chest pain in the past, presenting for routine followup of his PAD.   Weight down since his last clinic visit with me over a year ago Still does not eat well Continues to smoke, would like to try Chantix in effort to quit smoking Followed by Dr. Holley Raring for his end-stage renal disease, transplant Reports blood pressure well controlled, denies any claudication type symptoms Lower extremity Dopplers reviewed with him  Discussed recent carotid ultrasound images, moderate disease noted  No recent lipid panel available, reports having all his lab work done through nephrology Would like to have repeat lipid panel done through them  Denies anginal symptoms  Other past medical history previously required iron infusions  history of chronic discomfort in his legs. Reports having ABIs with Dr. Francoise Schaumann   Prior CV studies:   The following studies were reviewed today:  Echocardiogram over the past 6 months was essentially normal, normal ejection fraction estimated at greater than 55% . This was done in preparation for kidney transplant listing .   stress test in 2013 showed no ischemia, done at [facility], repeat stress test in the past month or so at [facility]  Did stress test in the past on treadmill, had  trouble Also did lexiscan 2015, had severe SOB   Past Medical History:  Diagnosis Date  . Acute kidney failure, unspecified ([place])   . Anxiety   . Diabetes mellitus, type 2 ([place])   . GERD (gastroesophageal reflux disease)   . History of arterial disease of lower extremity   . History of hepatitis B   . Hypertension   . Hypothyroidism   . Mitral valve disorders(424.0)   . Pityriasis 12/27/2014  . Primary pulmonary HTN ([place])    Pt denies ever having.  . Tricuspid valve disorders, specified as nonrheumatic    Past Surgical History:  Procedure Laterality Date  . APPENDECTOMY    . Carotid Doppler Ultrasound  06/14/2009   39% stenosis of bilateral internal carotid artery, bilateral anterograde vertebral flow  . ESOPHAGOGASTRODUODENOSCOPY (EGD) WITH PROPOFOL N/A 01/21/2019   Procedure: ESOPHAGOGASTRODUODENOSCOPY (EGD) WITH PROPOFOL;  Surgeon: Lin Landsman, MD;  Location: [facility];  Service: Endoscopy;  Laterality: N/A;  Diabetic - insulin  . EYE SURGERY     right  . HEMORROIDECTOMY    . KIDNEY TRANSPLANT Right 2016  . MECKEL DIVERTICULUM EXCISION     infancy  . Myocardial Perfusion scan  01/17/2009   [facility], non- ischemic. LVEF= 55%  . PARS PLANA VITRECTOMY Left 02/09/2015   Procedure: Pan retinal photocoagulation 26712;  Surgeon: Milus Height, MD;  Location: [facility];  Service: Ophthalmology;  Laterality: Left;  . REFRACTIVE SURGERY Left   . sleep study  01/09/2011   Severe sleep apnea. AHI 72.9/hr. RDI=83.0/hr. Desaturation  to 69.0% Emergency CPAP titaration to 14.0cm (01/14/19 resolved after wt loss after kidney transplant.)     No outpatient medications have been marked as taking for the 08/23/20 encounter (Appointment) with Minna Merritts, MD.     Allergies:   No known allergies   Social History   Tobacco Use  . Smoking status: Current Every Day Smoker    Packs/day: 0.75    Years: 38.00    Pack years: 28.50    Types: Cigarettes  .  Smokeless tobacco: Never Used  . Tobacco comment: since age 61.  Vaping Use  . Vaping Use: Former  Substance Use Topics  . Alcohol use: Yes    Alcohol/week: 0.0 - 1.0 standard drinks    Comment: Excessive alcohol consumption in the past. Quit around 2016. Drinks occasionally.  . Drug use: No     Current Outpatient Medications on File Prior to Visit  Medication Sig Dispense Refill  . alprazolam (XANAX) 2 MG tablet TAKE 1 TABLET BY MOUTH THREE TIMES DAILY AS NEEDED FOR SLEEP 90 tablet 1  . amLODipine (NORVASC) 10 MG tablet Take 10 mg by mouth daily.     . B-D ULTRAFINE III SHORT PEN 31G X 8 MM MISC     . BD INSULIN SYRINGE U/F 31G X 5/16" 1 ML MISC     . Blood Glucose Monitoring Suppl (GLUCOCOM BLOOD GLUCOSE MONITOR) DEVI Frequency:ONCE   Dosage:0.0     Instructions:  Note:Dose: N/A    . calcitRIOL (ROCALTROL) 0.25 MCG capsule     . carvedilol (COREG) 6.25 MG tablet TAKE 1 TABLET TWICE A DAY WITH MEALS (NEED APPOINTMENT FOR FURTHER REFILLS) 180 tablet 1  . cinacalcet (SENSIPAR) 30 MG tablet Take 30 mg by mouth daily.    Marland Kitchen EDEX 40 MCG injection 40 mcg by Intracavitary route as needed.     . insulin aspart (NOVOLOG) 100 UNIT/ML injection Inject 15 Units into the skin 3 (three) times daily before meals.    . insulin degludec (TRESIBA FLEXTOUCH) 100 UNIT/ML FlexTouch Pen Inject 30 Units into the skin daily.    . Lancets (ONETOUCH DELICA PLUS [id number]) [place]     . losartan (COZAAR) 100 MG tablet Take 1 tablet (100 mg total) by mouth at bedtime. 90 tablet 2  . mycophenolate (CELLCEPT) 500 MG tablet Take 1,000 mg by mouth 2 (two) times daily.    . naloxone (NARCAN) nasal spray 4 mg/0.1 mL Place 1 spray into the nose once.     . nicotine (NICODERM CQ) 14 mg/24hr patch Place 1 patch (14 mg total) onto the skin daily. 28 patch 0  . omeprazole (PRILOSEC) 20 MG capsule Take 2 capsules (40 mg total) by mouth 2 (two) times daily before a meal. 360 capsule 1  . ONETOUCH VERIO test strip     . oxyCODONE  (OXY IR/ROXICODONE) 5 MG immediate release tablet Take 1-2 tablets (5-10 mg total) by mouth every 6 (six) hours as needed for severe pain. 240 tablet 0  . rosuvastatin (CRESTOR) 40 MG tablet Take 1 tablet (40 mg total) by mouth daily. 90 tablet 1  . sildenafil (VIAGRA) 100 MG tablet TAKE ONE-HALF (1/2) TO ONE TABLET DAILY AS NEEDED FOR ERECTILE DYSFUNCTION 30 tablet 3  . tacrolimus (PROGRAF) 1 MG capsule Take 3 mg by mouth 2 (two) times daily.   11  . tretinoin (RETIN-A) 0.05 % cream Apply topically as needed.     . triamcinolone cream (KENALOG) 0.1 % APPLY DAILY TO INFLAMED BUMPS AS  NEEDED    . XYOSTED 100 MG/0.5ML SOAJ 0.5 mLs once a week. Every sunday     No current facility-administered medications on file prior to visit.     Family Hx: The patient's family history includes Hyperlipidemia in his mother; Hypertension in his mother; Melanoma in his father.  ROS:   Please see the history of present illness.    Review of Systems  Constitutional: Negative.   HENT: Negative.   Respiratory: Negative.   Cardiovascular: Negative.   Gastrointestinal: Negative.   Musculoskeletal: Negative.   Neurological: Negative.   Psychiatric/Behavioral: Negative.   All other systems reviewed and are negative.    Labs/Other Tests and Data Reviewed:    Recent Labs: 09/17/2019: ALT 9 06/20/2020: BUN 29; Creatinine, Ser 1.72; Potassium 4.2; Sodium 139   Recent Lipid Panel Lab Results  Component Value Date/Time   CHOL 141 06/20/2020 09:03 AM   TRIG 268 (H) 06/20/2020 09:03 AM   HDL 46 06/20/2020 09:03 AM   CHOLHDL 3.1 06/20/2020 09:03 AM   LDLCALC 53 06/20/2020 09:03 AM    Wt Readings from Last 3 Encounters:  06/20/20 222 lb 9.6 oz (101 kg)  05/12/20 223 lb (101.2 kg)  02/22/20 214 lb (97.1 kg)     Exam:    Vital Signs: Vital signs may also be detailed in the HPI There were no vitals taken for this visit.  Constitutional:  oriented to person, place, and time. No distress.  HENT:  Head:  Grossly normal Eyes:  no discharge. No scleral icterus.  Neck: No JVD, no carotid bruits  Cardiovascular: Regular rate and rhythm, no murmurs appreciated Pulmonary/Chest: Clear to auscultation bilaterally, no wheezes or rails Abdominal: Soft.  no distension.  no tenderness.  Musculoskeletal: Normal range of motion Neurological:  normal muscle tone. Coordination normal. No atrophy Skin: Skin warm and dry Psychiatric: normal affect, pleasant  ASSESSMENT & PLAN:    PAD (peripheral artery disease) (HCC) Reports having problems on aspirin, excessive bruising Cholesterol previously at goal, no changes made Discussed carotid and lower extremity arterial Dopplers with him Stressed importance of smoking cessation  Atherosclerosis of artery of extremity with ulceration ([place]) Denies significant claudication symptoms on today's visit  Bilateral carotid artery disease, unspecified type ([place]) Followed by [facility]  Mixed hyperlipidemia Tolerating statin, no changes made Goal LDL less than 70  Hypertension secondary to other renal disorders Blood pressure is well controlled on today's visit. No changes made to the medications.  Chest discomfort Discussed anginal symptoms for watch for, currently stable with no significant symptoms  Continue current medication regimen.  Medication Changes: No changes made   Total encounter time more than 25 minutes  Greater than 50% was spent in counseling and coordination of care with the patient   Signed, Ida Rogue, MD  08/22/2020 1:20 PM    James Moreno Office [id number], [place], [place] [address]

## 2020-08-23 ENCOUNTER — Ambulatory Visit: Payer: Medicare HMO | Admitting: Cardiovascular Disease

## 2020-08-23 DIAGNOSIS — I739 Peripheral vascular disease, unspecified: Secondary | ICD-10-CM

## 2020-08-23 DIAGNOSIS — I25118 Atherosclerotic heart disease of native coronary artery with other forms of angina pectoris: Secondary | ICD-10-CM

## 2020-08-23 DIAGNOSIS — I10 Essential (primary) hypertension: Secondary | ICD-10-CM

## 2020-08-23 DIAGNOSIS — I70299 Other atherosclerosis of native arteries of extremities, unspecified extremity: Secondary | ICD-10-CM

## 2020-08-23 DIAGNOSIS — E785 Hyperlipidemia, unspecified: Secondary | ICD-10-CM

## 2020-08-23 DIAGNOSIS — I779 Disorder of arteries and arterioles, unspecified: Secondary | ICD-10-CM

## 2020-08-23 DIAGNOSIS — Z720 Tobacco use: Secondary | ICD-10-CM

## 2020-08-24 ENCOUNTER — Encounter: Payer: Self-pay | Admitting: Cardiovascular Disease

## 2020-09-02 ENCOUNTER — Telehealth: Payer: Self-pay

## 2020-09-02 NOTE — Progress Notes (Signed)
Chronic Care Management Pharmacy Assistant   Name: James Moreno  MRN: [id number] DOB: April 07, 1965  Reason for Encounter:Diabetes Disease State Call.  Recent office visits:  No recent office visit  Recent consult visits:  No recent [facility] visits:  None in previous 6 months  Medications: Outpatient Encounter Medications as of 09/02/2020  Medication Sig Note   alprazolam (XANAX) 2 MG tablet TAKE 1 TABLET BY MOUTH THREE TIMES DAILY AS NEEDED FOR SLEEP    amLODipine (NORVASC) 10 MG tablet Take 10 mg by mouth daily.     B-D ULTRAFINE III SHORT PEN 31G X 8 MM MISC     BD INSULIN SYRINGE U/F 31G X 5/16" 1 ML MISC     Blood Glucose Monitoring Suppl (GLUCOCOM BLOOD GLUCOSE MONITOR) DEVI Frequency:ONCE   Dosage:0.0     Instructions:  Note:Dose: N/A    calcitRIOL (ROCALTROL) 0.25 MCG capsule     carvedilol (COREG) 6.25 MG tablet TAKE 1 TABLET TWICE A DAY WITH MEALS (NEED APPOINTMENT FOR FURTHER REFILLS)    cinacalcet (SENSIPAR) 30 MG tablet Take 30 mg by mouth daily.    EDEX 40 MCG injection 40 mcg by Intracavitary route as needed.     insulin aspart (NOVOLOG) 100 UNIT/ML injection Inject 15 Units into the skin 3 (three) times daily before meals. 5/23/2022Frances [place] through [name] Patient Assistance through Dec 2022    insulin degludec (TRESIBA FLEXTOUCH) 100 UNIT/ML FlexTouch Pen Inject 30 Units into the skin daily. 5/23/2022Frances [place] through [name] Patient Assistance through Dec 2022   Lancets [facility] DELICA PLUS [id number]) MISC     losartan (COZAAR) 100 MG tablet Take 1 tablet (100 mg total) by mouth at bedtime.    mycophenolate (CELLCEPT) 500 MG tablet Take 1,000 mg by mouth 2 (two) times daily.    naloxone (NARCAN) nasal spray 4 mg/0.1 mL Place 1 spray into the nose once.     nicotine (NICODERM CQ) 14 mg/24hr patch Place 1 patch (14 mg total) onto the skin daily.    omeprazole (PRILOSEC) 20 MG capsule Take 2 capsules (40 mg total) by mouth 2 (two)  times daily before a meal.    ONETOUCH VERIO test strip     oxyCODONE (OXY IR/ROXICODONE) 5 MG immediate release tablet Take 1-2 tablets (5-10 mg total) by mouth every 6 (six) hours as needed for severe pain.    rosuvastatin (CRESTOR) 40 MG tablet Take 1 tablet (40 mg total) by mouth daily.    sildenafil (VIAGRA) 100 MG tablet TAKE ONE-HALF (1/2) TO ONE TABLET DAILY AS NEEDED FOR ERECTILE DYSFUNCTION    tacrolimus (PROGRAF) 1 MG capsule Take 3 mg by mouth 2 (two) times daily.     tretinoin (RETIN-A) 0.05 % cream Apply topically as needed.     triamcinolone cream (KENALOG) 0.1 % APPLY DAILY TO INFLAMED BUMPS AS NEEDED    XYOSTED 100 MG/0.5ML SOAJ 0.5 mLs once a week. Every sunday    No facility-administered encounter medications on file as of 09/02/2020.    Care Gaps: Annual wellness is schedule on 01/24/2021 at PCP Office  Star Rating Drugs: Rosuvastatin 40 mg last filled on 05/30/2020 for 90 day supply at [facility].  Losartan 100 mg last filled on 06/21/2020 for 90 day supply.  Recent Relevant Labs: Lab Results  Component Value Date/Time   HGBA1C 8.8 05/25/2020 12:00 AM   HGBA1C 9.7 (H) 09/17/2019 02:17 PM   HGBA1C 6.7 12/12/2018 12:00 AM   MICROALBUR 100 12/31/2016 02:13  PM    Kidney Function Lab Results  Component Value Date/Time   CREATININE 1.72 (H) 06/20/2020 09:03 AM   CREATININE 1.92 (H) 09/17/2019 02:17 PM   CREATININE 9.88 (H) 06/21/2013 02:29 PM   GFRNONAA 39 (L) 09/17/2019 02:17 PM   GFRNONAA 6 (L) 06/21/2013 02:29 PM   GFRAA 45 (L) 09/17/2019 02:17 PM   GFRAA 6 (L) 06/21/2013 02:29 PM    Current antihyperglycemic regimen:  Novolog 15 units three times daily Tresiba 30 units daily What recent interventions/DTPs have been made to improve glycemic control:  Per [name] note on 08/15/2020 patient will administer 5 units Novolog if BG > 200 even if not eating Have there been any recent hospitalizations or ED visits since last visit with [name]? No   Adherence  Review: Is the patient currently on a STATIN medication? Yes Is the patient currently on ACE/ARB medication? Yes Does the patient have >5 day gap between last estimated fill dates? No  Patient is schedule for a telephone follow up with Clinical Pharmacist on 10/17/2020.  I have attempted without success to contact this patient by phone three times to do his Diabetes  Disease State call. I left a Voice message for patient to return my call. LVM 06/10,06/16 ,06/20  [place] Pharmacist Assistant [phone]

## 2020-09-04 ENCOUNTER — Other Ambulatory Visit: Payer: Self-pay | Admitting: Family Medicine

## 2020-09-05 ENCOUNTER — Other Ambulatory Visit: Payer: Self-pay | Admitting: Family Medicine

## 2020-09-05 NOTE — Telephone Encounter (Signed)
Copied from [place] [id number]. Topic: Quick Communication - Rx Refill/Question >> Sep 05, 2020 10:25 AM Yvette Rack wrote: Medication: alprazolam Duanne Moron) 2 MG tablet  Has the patient contacted their pharmacy? No. (Agent: If no, request that the patient contact the pharmacy for the refill.) (Agent: If yes, when and what did the pharmacy advise?)  Preferred Pharmacy (with phone number or street name): [facility], [place]  Phone: [phone]  Fax: [phone]  Agent: Please be advised that RX refills may take up to 3 business days. We ask that you follow-up with your pharmacy.

## 2020-09-07 MED ORDER — ALPRAZOLAM 2 MG PO TABS: ORAL_TABLET | ORAL | 3 refills | Status: DC

## 2020-09-13 ENCOUNTER — Other Ambulatory Visit: Payer: Self-pay | Admitting: Family Medicine

## 2020-09-13 DIAGNOSIS — L905 Scar conditions and fibrosis of skin: Secondary | ICD-10-CM

## 2020-09-13 DIAGNOSIS — R52 Pain, unspecified: Secondary | ICD-10-CM

## 2020-09-13 DIAGNOSIS — M79604 Pain in right leg: Secondary | ICD-10-CM

## 2020-09-13 NOTE — Telephone Encounter (Signed)
Medication Refill - Medication: oxyCODONE (OXY IR/ROXICODONE) 5 MG immediate release tablet    Has the patient contacted their pharmacy? No. (Agent: If no, request that the patient contact the pharmacy for the refill.) (Agent: If yes, when and what did the pharmacy advise?)  Preferred Pharmacy (with phone number or street name):  [id number] - [place], [place] - [address] Phone:  [phone]  Fax:  [phone]       Agent: Please be advised that RX refills may take up to 3 business days. We ask that you follow-up with your pharmacy. 

## 2020-09-13 NOTE — Telephone Encounter (Signed)
Requested medication (s) are due for refill today: yes   Requested medication (s) are on the active medication list: yes   Last refill: 08/15/2020  Future visit scheduled: yes  Notes to clinic: this refill cannot be delegated    Requested Prescriptions  Pending Prescriptions Disp Refills   oxyCODONE (OXY IR/ROXICODONE) 5 MG immediate release tablet 240 tablet 0    Sig: Take 1-2 tablets (5-10 mg total) by mouth every 6 (six) hours as needed for severe pain.      Not Delegated - Analgesics:  Opioid Agonists Failed - 09/13/2020 10:42 AM      Failed - This refill cannot be delegated      Failed - Urine Drug Screen completed in last 360 days      Passed - Valid encounter within last 6 months    Recent Outpatient Visits           2 months ago Type 2 diabetes mellitus with diabetic nephropathy, without long-term current use of insulin ([place])   [facility] Birdie Sons, MD   7 months ago Type 2 diabetes mellitus with diabetic nephropathy, with long-term current use of insulin ([place])   [facility] Birdie Sons, MD   1 year ago Type 2 diabetes mellitus with diabetic nephropathy, without long-term current use of insulin [facility])   [facility] Birdie Sons, MD   1 year ago Mixed hyperlipidemia   [facility] Birdie Sons, MD   2 years ago Hypertension secondary to other renal disorders   [place], Kirstie Peri, MD       Future Appointments             Tomorrow Caryn Section, Kirstie Peri, MD [facility] [place]

## 2020-09-14 ENCOUNTER — Ambulatory Visit: Payer: Self-pay | Admitting: Family Medicine

## 2020-09-14 MED ORDER — OXYCODONE HCL 5 MG PO TABS: 5.0000 mg | ORAL_TABLET | Freq: Four times a day (QID) | ORAL | 0 refills | Status: DC | PRN

## 2020-09-19 DIAGNOSIS — E1121 Type 2 diabetes mellitus with diabetic nephropathy: Secondary | ICD-10-CM | POA: Diagnosis not present

## 2020-09-19 DIAGNOSIS — Z794 Long term (current) use of insulin: Secondary | ICD-10-CM | POA: Diagnosis not present

## 2020-09-22 ENCOUNTER — Telehealth: Payer: Self-pay

## 2020-09-22 NOTE — Progress Notes (Signed)
    Chronic Care Management Pharmacy Assistant   Name: James Moreno  MRN: [id number] DOB: 1965/07/27  Reason for Encounter: Medication Review/Adherence Review   Recent office visits:  None ID  Recent consult visits:  None ID  Hospital visits:  None in previous 6 months  Medications: Outpatient Encounter Medications as of 09/22/2020  Medication Sig Note   alprazolam (XANAX) 2 MG tablet TAKE 1 TABLET BY MOUTH THREE TIMES DAILY AS NEEDED FOR SLEEP    amLODipine (NORVASC) 10 MG tablet Take 10 mg by mouth daily.     B-D ULTRAFINE III SHORT PEN 31G X 8 MM MISC     BD INSULIN SYRINGE U/F 31G X 5/16" 1 ML MISC     Blood Glucose Monitoring Suppl (GLUCOCOM BLOOD GLUCOSE MONITOR) DEVI Frequency:ONCE   Dosage:0.0     Instructions:  Note:Dose: N/A    calcitRIOL (ROCALTROL) 0.25 MCG capsule     carvedilol (COREG) 6.25 MG tablet TAKE 1 TABLET TWICE A DAY WITH MEALS (NEED APPOINTMENT FOR FURTHER REFILLS)    cinacalcet (SENSIPAR) 30 MG tablet Take 30 mg by mouth daily.    EDEX 40 MCG injection 40 mcg by Intracavitary route as needed.     insulin aspart (NOVOLOG) 100 UNIT/ML injection Inject 15 Units into the skin 3 (three) times daily before meals. 5/23/2022Frances [place] through [name] Patient Assistance through Dec 2022    insulin degludec (TRESIBA FLEXTOUCH) 100 UNIT/ML FlexTouch Pen Inject 30 Units into the skin daily. 5/23/2022Frances [place] through [name] Patient Assistance through Dec 2022   Lancets [facility] DELICA PLUS [id number]) MISC     losartan (COZAAR) 100 MG tablet Take 1 tablet (100 mg total) by mouth at bedtime.    mycophenolate (CELLCEPT) 500 MG tablet Take 1,000 mg by mouth 2 (two) times daily.    naloxone (NARCAN) nasal spray 4 mg/0.1 mL Place 1 spray into the nose once.     nicotine (NICODERM CQ) 14 mg/24hr patch Place 1 patch (14 mg total) onto the skin daily.    omeprazole (PRILOSEC) 20 MG capsule Take 2 capsules (40 mg total) by mouth 2 (two) times daily before a  meal.    ONETOUCH VERIO test strip     oxyCODONE (OXY IR/ROXICODONE) 5 MG immediate release tablet Take 1-2 tablets (5-10 mg total) by mouth every 6 (six) hours as needed for severe pain.    rosuvastatin (CRESTOR) 40 MG tablet Take 1 tablet (40 mg total) by mouth daily.    sildenafil (VIAGRA) 100 MG tablet TAKE ONE-HALF (1/2) TO ONE TABLET DAILY AS NEEDED FOR ERECTILE DYSFUNCTION    tacrolimus (PROGRAF) 1 MG capsule Take 3 mg by mouth 2 (two) times daily.     tretinoin (RETIN-A) 0.05 % cream Apply topically as needed.     triamcinolone cream (KENALOG) 0.1 % APPLY DAILY TO INFLAMED BUMPS AS NEEDED    XYOSTED 100 MG/0.5ML SOAJ 0.5 mLs once a week. Every sunday    No facility-administered encounter medications on file as of 09/22/2020.    Star Rating Drugs: Rosuvastatin 40 mg last filled on 05/30/2020 for 90 day supply at [facility]. Losartan 100 mg last filled on 06/21/2020 for 90 day supply.  Care Gaps: Per patient chart, patient is due for the following care gaps: Pneumococcal Vaccine, Shingrix, COVID-19 vaccine. and Cologuard. Annual wellness is schedule on 01/24/2021 at PCP [place] Pharmacist Assistant [phone]

## 2020-10-03 DIAGNOSIS — N5201 Erectile dysfunction due to arterial insufficiency: Secondary | ICD-10-CM | POA: Diagnosis not present

## 2020-10-03 DIAGNOSIS — E785 Hyperlipidemia, unspecified: Secondary | ICD-10-CM | POA: Diagnosis not present

## 2020-10-03 DIAGNOSIS — N189 Chronic kidney disease, unspecified: Secondary | ICD-10-CM | POA: Diagnosis not present

## 2020-10-03 DIAGNOSIS — E291 Testicular hypofunction: Secondary | ICD-10-CM | POA: Diagnosis not present

## 2020-10-03 DIAGNOSIS — E1165 Type 2 diabetes mellitus with hyperglycemia: Secondary | ICD-10-CM | POA: Diagnosis not present

## 2020-10-10 ENCOUNTER — Other Ambulatory Visit: Payer: Self-pay | Admitting: Family Medicine

## 2020-10-10 DIAGNOSIS — L905 Scar conditions and fibrosis of skin: Secondary | ICD-10-CM

## 2020-10-10 DIAGNOSIS — M79604 Pain in right leg: Secondary | ICD-10-CM

## 2020-10-10 DIAGNOSIS — R52 Pain, unspecified: Secondary | ICD-10-CM

## 2020-10-10 NOTE — Telephone Encounter (Signed)
Requested medication (s) are due for refill today: yes   Requested medication (s) are on the active medication list: yes   Last refill:  09/13/2020  Future visit scheduled: yes   Notes to clinic:  this refill cannot be delegated    Requested Prescriptions  Pending Prescriptions Disp Refills   oxyCODONE (OXY IR/ROXICODONE) 5 MG immediate release tablet 240 tablet 0    Sig: Take 1-2 tablets (5-10 mg total) by mouth every 6 (six) hours as needed for severe pain.      Not Delegated - Analgesics:  Opioid Agonists Failed - 10/10/2020 10:55 AM      Failed - This refill cannot be delegated      Failed - Urine Drug Screen completed in last 360 days      Passed - Valid encounter within last 6 months    Recent Outpatient Visits           3 months ago Type 2 diabetes mellitus with diabetic nephropathy, without long-term current use of insulin ([place])   [facility] Birdie Sons, MD   8 months ago Type 2 diabetes mellitus with diabetic nephropathy, with long-term current use of insulin ([place])   [facility] Birdie Sons, MD   1 year ago Type 2 diabetes mellitus with diabetic nephropathy, without long-term current use of insulin [facility])   [facility] Birdie Sons, MD   1 year ago Mixed hyperlipidemia   [facility] Birdie Sons, MD   2 years ago Hypertension secondary to other renal disorders   [place], Kirstie Peri, MD       Future Appointments             In 4 days Fisher, Kirstie Peri, MD [facility], [place]   In 1 week [place], Kathlene November, MD [facility], [place]

## 2020-10-10 NOTE — Telephone Encounter (Signed)
Medication: oxyCODONE (OXY IR/ROXICODONE) 5 MG immediate release tablet [[id number]]   Has the patient contacted their pharmacy? YES  (Agent: If no, request that the patient contact the pharmacy for the refill.) (Agent: If yes, when and what did the pharmacy advise?)   Preferred Pharmacy (with phone number or street name): [place], [place] - [address] [place] [address] Phone: [phone] Fax: [phone] Hours: Not open 24 hours  Agent: Please be advised that RX refills may take up to 3 business days. We ask that you follow-up with your pharmacy.

## 2020-10-11 MED ORDER — OXYCODONE HCL 5 MG PO TABS: 5.0000 mg | ORAL_TABLET | Freq: Four times a day (QID) | ORAL | 0 refills | Status: DC | PRN

## 2020-10-14 ENCOUNTER — Ambulatory Visit (INDEPENDENT_AMBULATORY_CARE_PROVIDER_SITE_OTHER): Payer: Medicare HMO | Admitting: Family Medicine

## 2020-10-14 ENCOUNTER — Other Ambulatory Visit: Payer: Self-pay

## 2020-10-14 ENCOUNTER — Encounter: Payer: Self-pay | Admitting: Family Medicine

## 2020-10-14 VITALS — BP 164/76 | HR 86 | Temp 98.6°F | Ht 71.0 in | Wt 226.8 lb

## 2020-10-14 DIAGNOSIS — I10 Essential (primary) hypertension: Secondary | ICD-10-CM

## 2020-10-14 DIAGNOSIS — F1190 Opioid use, unspecified, uncomplicated: Secondary | ICD-10-CM

## 2020-10-14 DIAGNOSIS — K219 Gastro-esophageal reflux disease without esophagitis: Secondary | ICD-10-CM

## 2020-10-14 DIAGNOSIS — G8928 Other chronic postprocedural pain: Secondary | ICD-10-CM | POA: Diagnosis not present

## 2020-10-14 DIAGNOSIS — R09A2 Foreign body sensation, throat: Secondary | ICD-10-CM

## 2020-10-14 DIAGNOSIS — L905 Scar conditions and fibrosis of skin: Secondary | ICD-10-CM

## 2020-10-14 DIAGNOSIS — R198 Other specified symptoms and signs involving the digestive system and abdomen: Secondary | ICD-10-CM | POA: Diagnosis not present

## 2020-10-14 DIAGNOSIS — R52 Pain, unspecified: Secondary | ICD-10-CM | POA: Diagnosis not present

## 2020-10-14 DIAGNOSIS — E1121 Type 2 diabetes mellitus with diabetic nephropathy: Secondary | ICD-10-CM

## 2020-10-14 DIAGNOSIS — E782 Mixed hyperlipidemia: Secondary | ICD-10-CM

## 2020-10-14 DIAGNOSIS — R0989 Other specified symptoms and signs involving the circulatory and respiratory systems: Secondary | ICD-10-CM

## 2020-10-14 NOTE — Progress Notes (Signed)
Established patient visit   Patient: James Moreno   DOB: 03/26/1966   55 y.o. Male  MRN: [id number] Visit Date: 10/14/2020  Today's healthcare provider: Lelon Huh, MD   No chief complaint on file.  Subjective    HPI    A1C checked with endocrinologist. BP elevated  Complains having acid choking and waking him up night. GI doubles dose of omeprazole which helps with heart burn, but still with gagging sensation at night.       Medications: Outpatient Medications Prior to Visit  Medication Sig   alprazolam (XANAX) 2 MG tablet TAKE 1 TABLET BY MOUTH THREE TIMES DAILY AS NEEDED FOR SLEEP   amLODipine (NORVASC) 10 MG tablet Take 10 mg by mouth daily.    B-D ULTRAFINE III SHORT PEN 31G X 8 MM MISC    BD INSULIN SYRINGE U/F 31G X 5/16" 1 ML MISC    Blood Glucose Monitoring Suppl (GLUCOCOM BLOOD GLUCOSE MONITOR) DEVI Frequency:ONCE   Dosage:0.0     Instructions:  Note:Dose: N/A   calcitRIOL (ROCALTROL) 0.25 MCG capsule    carvedilol (COREG) 6.25 MG tablet TAKE 1 TABLET TWICE A DAY WITH MEALS (NEED APPOINTMENT FOR FURTHER REFILLS)   cinacalcet (SENSIPAR) 30 MG tablet Take 30 mg by mouth daily.   EDEX 40 MCG injection 40 mcg by Intracavitary route as needed.    insulin aspart (NOVOLOG) 100 UNIT/ML injection Inject 15 Units into the skin 3 (three) times daily before meals.   insulin degludec (TRESIBA FLEXTOUCH) 100 UNIT/ML FlexTouch Pen Inject 30 Units into the skin daily.   Lancets (ONETOUCH DELICA PLUS [id number]) MISC    losartan (COZAAR) 100 MG tablet Take 1 tablet (100 mg total) by mouth at bedtime.   mycophenolate (CELLCEPT) 500 MG tablet Take 1,000 mg by mouth 2 (two) times daily.   naloxone (NARCAN) nasal spray 4 mg/0.1 mL Place 1 spray into the nose once.    nicotine (NICODERM CQ) 14 mg/24hr patch Place 1 patch (14 mg total) onto the skin daily.   omeprazole (PRILOSEC) 20 MG capsule Take 2 capsules (40 mg total) by mouth 2 (two) times daily before a meal.   ONETOUCH  VERIO test strip    oxyCODONE (OXY IR/ROXICODONE) 5 MG immediate release tablet Take 1-2 tablets (5-10 mg total) by mouth every 6 (six) hours as needed for severe pain.   rosuvastatin (CRESTOR) 40 MG tablet Take 1 tablet (40 mg total) by mouth daily.   sildenafil (VIAGRA) 100 MG tablet TAKE ONE-HALF (1/2) TO ONE TABLET DAILY AS NEEDED FOR ERECTILE DYSFUNCTION   tacrolimus (PROGRAF) 1 MG capsule Take 3 mg by mouth 2 (two) times daily.    tretinoin (RETIN-A) 0.05 % cream Apply topically as needed.    triamcinolone cream (KENALOG) 0.1 % APPLY DAILY TO INFLAMED BUMPS AS NEEDED   XYOSTED 100 MG/0.5ML SOAJ 0.5 mLs once a week. Every sunday   No facility-administered medications prior to visit.      Objective    BP (!) 164/76   Pulse 86   Temp 98.6 F (37 C) (Oral)   Ht 5\' 11"  (1.803 m)   Wt 226 lb 12.8 oz (102.9 kg)   SpO2 98%   BMI 31.63 kg/m     Physical Exam   General: Appearance:    Mildly obese male in no acute distress  Eyes:    PERRL, conjunctiva/corneas clear, EOM's intact       Lungs:     Clear to auscultation bilaterally,  respirations unlabored  Heart:    Normal heart rate. Normal rhythm. No murmurs, rubs, or gallops.    MS:   All extremities are intact.    Neurologic:   Awake, alert, oriented x 3. No apparent focal neurological defect.        Assessment & Plan     1. Type 2 diabetes mellitus with diabetic nephropathy, without long-term current use of insulin ([place])  - Ambulatory referral to Podiatry  Continue follow up Dr. Carmela Rima as scheduled.   2. Primary hypertension He reports home blood pressure much better than today's reading in the office Continue current medications.    3. Mixed hyperlipidemia He is tolerating rosuvastatin well with no adverse effects.    4. Globus pharyngeus  - Ambulatory referral to ENT  5. Gastroesophageal reflux disease, unspecified whether esophagitis present On high dose PPI per GI  6. Pain in surgical scar Well controlled  on current medication regiment.  - Pain Mgt Scrn (14 Drugs), Ur  7. Chronic, continuous use of opioids  - Pain Mgt Scrn (14 Drugs), Ur   Future Appointments  Date Time Provider [place]  10/17/2020  3:00 PM [facility] CCM PHARMACIST [facility]  10/21/2020 11:00 AM Minna Merritts, MD [facility]  01/24/2021 11:00 AM [facility]-NURSE HEALTH ADVISOR [facility]  04/17/2021  3:20 PM Jerimyah Vandunk, Kirstie Peri, MD [facility]        The entirety of the information documented in the History of Present Illness, Review of Systems and Physical Exam were personally obtained by me. Portions of this information were initially documented by the CMA and reviewed by me for thoroughness and accuracy.     Lelon Huh, MD  [facility] [phone] (phone) [phone] (fax)  [place]

## 2020-10-14 NOTE — Patient Instructions (Addendum)
.   Please review the attached list of medications and notify my office if there are any errors.   . Please bring all of your medications to every appointment so we can make sure that our medication list is the same as yours.   

## 2020-10-15 LAB — PAIN MGT SCRN (14 DRUGS), UR
Amphetamine Scrn, Ur: NEGATIVE ng/mL
BARBITURATE SCREEN URINE: NEGATIVE ng/mL
BENZODIAZEPINE SCREEN, URINE: NEGATIVE ng/mL
Buprenorphine, Urine: NEGATIVE ng/mL
CANNABINOIDS UR QL SCN: NEGATIVE ng/mL
Cocaine (Metab) Scrn, Ur: NEGATIVE ng/mL
Creatinine(Crt), U: 88.2 mg/dL (ref 20.0–300.0)
Fentanyl, Urine: NEGATIVE pg/mL
Meperidine Screen, Urine: NEGATIVE ng/mL
Methadone Screen, Urine: NEGATIVE ng/mL
OXYCODONE+OXYMORPHONE UR QL SCN: POSITIVE ng/mL — AB
Opiate Scrn, Ur: POSITIVE ng/mL — AB
Ph of Urine: 5.3 (ref 4.5–8.9)
Phencyclidine Qn, Ur: NEGATIVE ng/mL
Propoxyphene Scrn, Ur: NEGATIVE ng/mL
Tramadol Screen, Urine: NEGATIVE ng/mL

## 2020-10-17 ENCOUNTER — Ambulatory Visit (INDEPENDENT_AMBULATORY_CARE_PROVIDER_SITE_OTHER): Payer: Medicare HMO

## 2020-10-17 DIAGNOSIS — E1140 Type 2 diabetes mellitus with diabetic neuropathy, unspecified: Secondary | ICD-10-CM | POA: Diagnosis not present

## 2020-10-17 DIAGNOSIS — I152 Hypertension secondary to endocrine disorders: Secondary | ICD-10-CM

## 2020-10-17 DIAGNOSIS — Z794 Long term (current) use of insulin: Secondary | ICD-10-CM

## 2020-10-17 DIAGNOSIS — E1169 Type 2 diabetes mellitus with other specified complication: Secondary | ICD-10-CM

## 2020-10-17 DIAGNOSIS — E1159 Type 2 diabetes mellitus with other circulatory complications: Secondary | ICD-10-CM | POA: Diagnosis not present

## 2020-10-17 DIAGNOSIS — E785 Hyperlipidemia, unspecified: Secondary | ICD-10-CM | POA: Diagnosis not present

## 2020-10-17 NOTE — Progress Notes (Signed)
Chronic Care Management Pharmacy Note  10/18/2020 Name:  James Moreno MRN:  [id number] DOB:  1965/11/13  Summary: Patient presents for CCM follow-up. He continues to use the [employer] 2 CGM to monitor his blood sugars. He reports being frustrated over the highs and lows he has been seeing. He has follow-up with endocrinology and cardiology this week.  Recommendations/Changes made from today's visit: Continues current medications  Plan: [name] follow-up in one month  Subjective: James Moreno is an 55 y.o. year old male who is a primary patient of Fisher, Kirstie Peri, MD.  The CCM team was consulted for assistance with disease management and care coordination needs.    Engaged with patient by telephone for follow up visit in response to provider referral for pharmacy case management and/or care coordination services.   Consent to Services:  The patient was given information about Chronic Care Management services, agreed to services, and gave verbal consent prior to initiation of services.  Please see initial visit note for detailed documentation.   Patient Care Team: Birdie Sons, MD as PCP - General (Family Medicine) Rockey Situ Kathlene November, MD as PCP - Cardiology (Cardiology) Ronnald Collum, Lourdes Sledge, MD as Attending Physician (Endocrinology) [place], [place] (Optometry) Anthonette Legato, MD (Nephrology) Long, Thomes Cake, MD as Referring Physician (Vascular Surgery) Isaias Sakai, MD as Referring Physician (Ophthalmology) Lin Landsman, MD as Consulting Physician (Gastroenterology) Wellington Hampshire, MD as Consulting Physician (Cardiology) Germaine Pomfret, [facility] (Pharmacist)  Recent office visits: 06/20/20: Patient presented to Dr. Caryn Section for follow-up. Chantix stopped, patient started on Nicotine Patch 21 mg daily.  02/12/20: Video visit with Dr. Caryn Section for follow-up. Patient given Lantus 30 units daily due to cost .   Recent consult visits: 07/21/20: Patient  presented to Dr. Holley Raring (Nephrology) for follow-up.  06/01/20: Patient presented to Dr. Ronnald Collum (Endocrinology) for initial visit.  05/12/20: Patient presented to Laurann Montana, NP for follow-up. Claudication stable.   Hospital visits: None in previous 6 months  Objective:  Lab Results  Component Value Date   CREATININE 1.72 (H) 06/20/2020   BUN 29 (H) 06/20/2020   GFRNONAA 39 (L) 09/17/2019   GFRAA 45 (L) 09/17/2019   NA 139 06/20/2020   K 4.2 06/20/2020   CALCIUM 10.7 (H) 06/20/2020   CO2 21 06/20/2020    Lab Results  Component Value Date/Time   HGBA1C 8.8 05/25/2020 12:00 AM   HGBA1C 9.7 (H) 09/17/2019 02:17 PM   HGBA1C 6.7 12/12/2018 12:00 AM   MICROALBUR 100 12/31/2016 02:13 PM    Last diabetic Eye exam:  Lab Results  Component Value Date/Time   HMDIABEYEEXA Retinopathy (A) 02/15/2020 12:00 AM    Last diabetic Foot exam: No results found for: HMDIABFOOTEX   Lab Results  Component Value Date   CHOL 141 06/20/2020   HDL 46 06/20/2020   LDLCALC 53 06/20/2020   TRIG 268 (H) 06/20/2020   CHOLHDL 3.1 06/20/2020    Hepatic Function Latest Ref Rng & Units 06/20/2020 09/17/2019 12/12/2018  Total Protein 6.0 - 8.5 g/dL - 6.5 -  Albumin 3.8 - 4.9 g/dL 4.2 4.3 -  AST 0 - 40 IU/L - 9 14  ALT 0 - 44 IU/L - 9 16  Alk Phosphatase 48 - 121 IU/L - 131(H) 95  Total Bilirubin 0.0 - 1.2 mg/dL - 0.6 -    Lab Results  Component Value Date/Time   TSH 1.34 12/12/2018 12:00 AM   TSH 1.72 06/13/2006 12:00 AM    CBC  Latest Ref Rng & Units 12/12/2018 10/21/2015 06/21/2013  WBC - 6.7 8.9 9.7  Hemoglobin 13.5 - 17.5 14.8 16.3 10.1(L)  Hematocrit 41 - 53 43 46.7 28.9(L)  Platelets 150 - 399 182 140(L) 175    No results found for: VD25OH  Clinical ASCVD: Yes  The 10-year ASCVD risk score Mikey Bussing DC Jr., et al., 2013) is: 24.2%   Values used to calculate the score:     Age: 35 years     Sex: Male     Is Non-Hispanic African American: No     Diabetic: Yes     Tobacco smoker: Yes      Systolic Blood Pressure: 518 mmHg     Is BP treated: Yes     HDL Cholesterol: 46 mg/dL     Total Cholesterol: 141 mg/dL    Depression screen [facility] 2/9 10/14/2020 01/19/2020 01/06/2019  Decreased Interest 0 0 0  Down, Depressed, Hopeless 0 0 0  PHQ - 2 Score 0 0 0  Altered sleeping 0 - -  Tired, decreased energy 0 - -  Change in appetite 0 - -  Feeling bad or failure about yourself  0 - -  Trouble concentrating 0 - -  Moving slowly or fidgety/restless 0 - -  Suicidal thoughts 0 - -  PHQ-9 Score 0 - -  Difficult doing work/chores Not difficult at all - -  Some recent data might be hidden      Social History   Tobacco Use  Smoking Status Every Day   Packs/day: 0.75   Years: 38.00   Pack years: 28.50   Types: Cigarettes  Smokeless Tobacco Never  Tobacco Comments   since age 58.   BP Readings from Last 3 Encounters:  10/14/20 (!) 164/76  06/20/20 124/60  05/12/20 110/60   Pulse Readings from Last 3 Encounters:  10/14/20 86  06/20/20 91  05/12/20 98   Wt Readings from Last 3 Encounters:  10/14/20 226 lb 12.8 oz (102.9 kg)  06/20/20 222 lb 9.6 oz (101 kg)  05/12/20 223 lb (101.2 kg)    Assessment/Interventions: Review of patient past medical history, allergies, medications, health status, including review of consultants reports, laboratory and other test data, was performed as part of comprehensive evaluation and provision of chronic care management services.   SDOH:  (Social Determinants of Health) assessments and interventions performed: Yes SDOH Interventions    Flowsheet Row Most Recent Value  SDOH Interventions   Financial Strain Interventions Intervention Not Indicated        CCM Care Plan  Allergies  Allergen Reactions   No Known Allergies     Medications Reviewed Today     Reviewed by Germaine Pomfret, [place] (Pharmacist) on 10/18/20 at 1122  Med List Status: <None>   Medication Order Taking? Sig Documenting Provider Last Dose Status  Informant  alprazolam (XANAX) 2 MG tablet [id number]  TAKE 1 TABLET BY MOUTH THREE TIMES DAILY AS NEEDED FOR SLEEP Birdie Sons, MD  Active   amLODipine (NORVASC) 10 MG tablet [id number]  Take 10 mg by mouth daily.  [provider]  Active   B-D ULTRAFINE III SHORT PEN 31G X 8 MM MISC [id number]   [provider]  Active   BD INSULIN SYRINGE U/F 31G X 5/16" 1 ML [place] [id number]   [provider]  Active   Blood Glucose Monitoring Suppl ([place]) DEVI [id number]  Frequency:ONCE   Dosage:0.0     Instructions:  Note:Dose: N/A  [provider]  Active Self           Med Note Jeanie Cooks Oct 30, 2017 11:50 AM)    calcitRIOL (ROCALTROL) 0.25 MCG capsule [id number]   [provider]  Active   carvedilol (COREG) 6.25 MG tablet [id number]  TAKE 1 TABLET TWICE A DAY WITH MEALS (NEED APPOINTMENT FOR FURTHER REFILLS) Minna Merritts, MD  Active            Med Note Tyrone Sage May 12, 2020 11:06 AM)    cinacalcet (SENSIPAR) 30 MG tablet [id number]  Take 30 mg by mouth daily. [provider]  Active Self  EDEX 40 MCG injection [id number]  40 mcg by Intracavitary route as needed.  [provider]  Active   insulin aspart (NOVOLOG) 100 UNIT/ML injection [id number] Yes Inject 15 Units into the skin 3 (three) times daily before meals. [provider] Taking Active Self           Med Note Raeford Razor Aug 15, 2020  3:50 PM) Frances [place]wood through [name] Patient Assistance through Dec 2022   insulin degludec [facility]) 100 UNIT/ML FlexTouch Pen [id number] Yes Inject 30 Units into the skin daily. [provider] Taking Active            Med Note Raeford Razor Aug 15, 2020  3:50 PM) Frances Maywood through [name] Patient Assistance through Dec 2022  Lancets (ONETOUCH DELICA PLUS [id number]) [place] [id number]   [provider]  Active   losartan (COZAAR)  100 MG tablet [id number]  Take 1 tablet (100 mg total) by mouth at bedtime. Rise Mu, PA-C  Active   mycophenolate (CELLCEPT) 500 MG tablet [id number]  Take 1,000 mg by mouth 2 (two) times daily. [provider]  Active   naloxone [facility]) nasal spray 4 mg/0.1 mL [id number]  Place 1 spray into the nose once.  [provider]  Active   nicotine (NICODERM CQ) 14 mg/24hr patch [id number]  Place 1 patch (14 mg total) onto the skin daily. Birdie Sons, MD  Active   omeprazole (PRILOSEC) 20 MG capsule [id number]  Take 2 capsules (40 mg total) by mouth 2 (two) times daily before a meal. Lin Landsman, MD  Expired 08/07/20 2359   ONETOUCH VERIO test strip [id number]   [provider]  Active   oxyCODONE (OXY IR/ROXICODONE) 5 MG immediate release tablet [id number]  Take 1-2 tablets (5-10 mg total) by mouth every 6 (six) hours as needed for severe pain. Birdie Sons, MD  Active   rosuvastatin (CRESTOR) 40 MG tablet [id number]  Take 1 tablet (40 mg total) by mouth daily. Minna Merritts, MD  Active   sildenafil (VIAGRA) 100 MG tablet [id number]  TAKE ONE-HALF (1/2) TO ONE TABLET DAILY AS NEEDED FOR ERECTILE DYSFUNCTION Birdie Sons, MD  Active   tacrolimus (PROGRAF) 1 MG capsule [id number]  Take 3 mg by mouth 2 (two) times daily.  [provider]  Active Self           Med Note Kenton Kingfisher, Germaine Pomfret Oct 30, 2017 11:51 AM)    tretinoin (RETIN-A) 0.05 % cream [id number]  Apply topically as needed.  [provider]  Active   triamcinolone cream (KENALOG) 0.1 % [id number]  APPLY DAILY TO INFLAMED BUMPS AS NEEDED [provider]  Active   XYOSTED 100 MG/0.5ML Darden Palmer [id number]  0.5 mLs once a week. Every sunday [provider]  Active             Patient Active Problem List   Diagnosis Date Noted   Proliferative retinopathy of left eye due to diabetes mellitus ([place]) 01/28/2019   Esophageal dysphagia    Benign essential hypertension  12/11/2018   Secondary hyperparathyroidism of renal origin ([place]) 12/11/2018   Severe tobacco use disorder 08/01/2018   Atherosclerosis of artery of extremity with ulceration ([place]) 07/23/2018   Gastroesophageal reflux disease 05/26/2018   PAD (peripheral artery disease) ([place]) 10/30/2017   Chronic ulcer of heel, right, with unspecified severity ([place]) 10/16/2017   Hepatitis B core antibody positive 07/03/2017   Hypertriglyceridemia 07/03/2017   Chronic, continuous use of opioids 03/28/2016   Anxiety 03/28/2016   Pain in surgical scar 11/07/2015   Bulging eyes 01/24/2015   Leg mass 01/24/2015   Renal transplant, status post 01/24/2015   Carotid arterial disease ([place]) 12/27/2014   Compulsive tobacco user syndrome 12/27/2014   Abnormal EKG 04/06/2013   Erectile dysfunction 11/29/2012   Obesity 11/28/2012   Type 2 diabetes mellitus with diabetic nephropathy ([place]) 11/28/2012   Hypertension 12/21/2011   Hyperlipidemia 12/21/2011   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 10/30/2011   Obstructive apnea 01/09/2011   History of other malignant neoplasm of skin 07/16/2006   Glaucoma 01/13/2006   Episodic paroxysmal anxiety disorder 03/26/1998    Immunization History  Administered Date(s) Administered   Influenza Split 12/05/2010   Influenza,inj,Quad PF,6+ Mos 12/31/2016, 01/27/2019   PFIZER(Purple Top)SARS-COV-2 Vaccination 06/18/2019, 07/09/2019, 02/22/2020   Pneumococcal Polysaccharide-23 10/24/2010   Tdap 12/05/2010    Conditions to be addressed/monitored:  Hypertension, Hyperlipidemia, Diabetes, Coronary Artery Disease, GERD, Anxiety, Tobacco use and History of Renal Transplant   Care Plan : General Pharmacy (Adult)  Updates made by Germaine Pomfret, RPH since 10/18/2020 12:00 AM     Problem: Hypertension, Hyperlipidemia, Diabetes, Coronary Artery Disease, GERD, Anxiety, Tobacco use and History of Renal Transplant   Priority: High     Long-Range Goal: Patient-Specific Goal    Start Date: 05/19/2020  Expected End Date: 11/16/2020  This Visit's Progress: On track  Recent Progress: On track  Priority: High  Note:   Current Barriers:  Unable to independently afford treatment regimen Unable to achieve control of Diabetes   Pharmacist Clinical Goal(s):  Over the next 90 days, patient will verbalize ability to afford treatment regimen achieve control of Diabetes as evidenced by A1c less than 7% through collaboration with PharmD and provider.   Interventions: 1:1 collaboration with Birdie Sons, MD regarding development and update of comprehensive plan of care as evidenced by provider attestation and co-signature Inter-disciplinary care team collaboration (see longitudinal plan of care) Comprehensive medication review performed; medication list updated in electronic medical record  Hypertension (BP goal <130/80) -Controlled -Current treatment: Amlodipine 10 mg daily  Carvedilol 6.25 mg twice daily  Losartan 100 mg daily  -Medications previously tried: NA  -Current home readings: 120-140/70-80  -Denies hypotensive/hypertensive symptoms -Educated on Daily salt intake goal < 2300 mg; Importance of home blood pressure monitoring; -Counseled to monitor BP at home 2-3 times weekly, document, and provide log at future appointments -Recommended to continue current medication  Hyperlipidemia: (LDL goal < 70) -History of PAD, CAD  -Controlled -Current treatment: Rosuvastatin 40 mg daily  -Medications previously tried: NA  -Educated on Importance of limiting foods high in cholesterol; -Recommended to continue current medication  Diabetes (A1c goal <7%) -Uncontrolled -Current medications: Novolog 15 units  three times daily + 2 units for every 50 units above 150  Tresiba 30 units daily -Medications previously tried: Lantus (Formulary)  -Current home glucose readings  Target 7/12-7/25  Number of days worn ? 14 days 14  % of time active ? 70% 74%  Mean  Glucose (mg/dL)  229  GMI (2-week A1c estimate) = 3.31 + 0.02392 x [mean glucose in mg/dL]  8.8%  Glycemic Variability (%CV) ?36% NA  Time above >250 mg/dL <5% 35%  Time above 70-180 mg/dL <25% 29%  Time in range: 70-180 mg/dL >70% 36%  Time below 70 mg/dL <4% 0%  Time below 54 mg/dL <1% 0%  -Patient with large portion of the day with severe hyperglycemia, with 2-3 instances of hypoglycemia due to not eating.  -Reports hypoglycemic symptoms: Clammy, disoriented. -Patient has Endocrinology follow-up in 2 days, will defer medication changes until future visit.  -Recommended to continue current medication  Depression/Anxiety (Goal: Maintain stable mood and sleep) -Controlled -Current treatment: Alprazolam 2 mg three times daily as needed - Sleep  -Medications previously tried/failed: NA -PHQ9: 0 -GAD7: 0 -Educated on Benefits of medication for symptom control Benefits of cognitive-behavioral therapy with or without medication -Recommended to continue current medication   Renal Transplant  (Goal: prevent rejection of kidney ) -Managed by Dr. Holley Raring  -Controlled -Current treatment  Mycophenolate 500 mg 2 tablets twice daily  Tacrolimus 1 mg 3 capsules twice daily  -Medications previously tried: Myfortic (cost) -Drug interactions evaluated, no significant drug interactions noted with current regimen.  -Recommended to continue current medication  Tobacco use (Goal Quit Smoking) -Controlled -Previous quit attempts: Chantix, Nicotine Patch (never started) -Current treatment  None -Patient smokes Within 30 minutes of waking -Patient triggers include: boredom  and finishing a meal -Counseled on identifying alternative stress management tools such as walking, reading or exercising   Allergic Rhinitis (Goal: Minimize symptoms) -Not ideally controlled -Current treatment  None -Medications previously tried: Flonase (ineffective), Zyrtec (ineffective) -Symptoms significantly worse.  Patient reports itchy eyes, congestion.  -Recommended claritin 10 mg daily  Chronic Kidney Disease Stage 3a  -All medications assessed for renal dosing and appropriateness in chronic kidney disease. -Recommended to continue current medication  Patient Goals/Self-Care Activities Over the next 90 days, patient will:  - check glucose 2-3 times daily , document, and provide at future appointments -check blood pressure 2-3 times weekly , document, and provide at future appointments -decrease cigarette use   Follow Up Plan: Telephone follow up appointment with care management team member scheduled for:  11/11/2020 at 1:15 PM        Medication Assistance:  Mirian Mo obtained through [name] medication assistance program.  Enrollment ends Dec 2022  Patient's preferred pharmacy is:  [facility] [address] [place] - [place] [address] Phone: [phone] Fax: [phone]  [facility] ([facility] Only) - [place], [address] [place] [address] Phone: [phone] Fax: [place], [place] [address] [address] Phone: [phone] Fax: [facility] ([facility]) - [place], [place] [address] Phone: [phone] Fax: [phone]  [facility] [address] [place]), [place] - [place]) [place] [address] Phone: [phone] Fax: [phone]  Uses pill box? Yes Pt endorses 100% compliance  We  discussed: Current pharmacy is preferred with insurance plan and patient is satisfied with pharmacy services Patient decided to: Continue current medication management strategy  Care Plan and Follow Up Patient Decision:  Patient agrees to Care Plan and  Follow-up.  Plan: Telephone follow up appointment with care management team member scheduled for:  11/11/2020 at 1:15 PM   [place] [phone]

## 2020-10-18 NOTE — Patient Instructions (Signed)
Visit Information It was great speaking with you today!  Please let me know if you have any questions about our visit.   Goals Addressed             This Visit's Progress    Monitor and Manage My Blood Sugar-Diabetes Type 2   On track    Timeframe:  Long-Range Goal Priority:  High Start Date: 05/17/2020                            Expected End Date: 11/16/2021                       Follow Up Date 11/07/2020    - check blood sugar at prescribed times - check blood sugar if I feel it is too high or too low - enter blood sugar readings and medication or insulin into daily log    Why is this important?   Checking your blood sugar at home helps to keep it from getting very high or very low.  Writing the results in a diary or log helps the doctor know how to care for you.  Your blood sugar log should have the time, date and the results.  Also, write down the amount of insulin or other medicine that you take.  Other information, like what you ate, exercise done and how you were feeling, will also be helpful.     Notes:      Quit Smoking   Not on track    Recommend to continue efforts to reduce smoking habits until no longer smoking (Smoking Cessation literature attached to AVS).            Patient Care Plan: General Pharmacy (Adult)     Problem Identified: Hypertension, Hyperlipidemia, Diabetes, Coronary Artery Disease, GERD, Anxiety, Tobacco use and History of Renal Transplant   Priority: High     Long-Range Goal: Patient-Specific Goal   Start Date: 05/19/2020  Expected End Date: 11/16/2020  This Visit's Progress: On track  Recent Progress: On track  Priority: High  Note:   Current Barriers:  Unable to independently afford treatment regimen Unable to achieve control of Diabetes   Pharmacist Clinical Goal(s):  Over the next 90 days, patient will verbalize ability to afford treatment regimen achieve control of Diabetes as evidenced by A1c less than 7% through  collaboration with PharmD and provider.   Interventions: 1:1 collaboration with Birdie Sons, MD regarding development and update of comprehensive plan of care as evidenced by provider attestation and co-signature Inter-disciplinary care team collaboration (see longitudinal plan of care) Comprehensive medication review performed; medication list updated in electronic medical record  Hypertension (BP goal <130/80) -Controlled -Current treatment: Amlodipine 10 mg daily  Carvedilol 6.25 mg twice daily  Losartan 100 mg daily  -Medications previously tried: NA  -Current home readings: 120-140/70-80  -Denies hypotensive/hypertensive symptoms -Educated on Daily salt intake goal < 2300 mg; Importance of home blood pressure monitoring; -Counseled to monitor BP at home 2-3 times weekly, document, and provide log at future appointments -Recommended to continue current medication  Hyperlipidemia: (LDL goal < 70) -History of PAD, CAD  -Controlled -Current treatment: Rosuvastatin 40 mg daily  -Medications previously tried: NA  -Educated on Importance of limiting foods high in cholesterol; -Recommended to continue current medication  Diabetes (A1c goal <7%) -Uncontrolled -Current medications: Novolog 15 units three times daily + 2 units for every 50 units above 150  Tresiba 30 units daily -Medications previously tried: Lantus (Formulary)  -Current home glucose readings  Target 7/12-7/25  Number of days worn ? 14 days 14  % of time active ? 70% 74%  Mean Glucose (mg/dL)  229  GMI (2-week A1c estimate) = 3.31 + 0.02392 x [mean glucose in mg/dL]  8.8%  Glycemic Variability (%CV) ?36% NA  Time above >250 mg/dL <5% 35%  Time above 70-180 mg/dL <25% 29%  Time in range: 70-180 mg/dL >70% 36%  Time below 70 mg/dL <4% 0%  Time below 54 mg/dL <1% 0%  -Patient with large portion of the day with severe hyperglycemia, with 2-3 instances of hypoglycemia due to not eating.  -Reports  hypoglycemic symptoms: Clammy, disoriented. -Patient has Endocrinology follow-up in 2 days, will defer medication changes until future visit.  -Recommended to continue current medication  Depression/Anxiety (Goal: Maintain stable mood and sleep) -Controlled -Current treatment: Alprazolam 2 mg three times daily as needed - Sleep  -Medications previously tried/failed: NA -PHQ9: 0 -GAD7: 0 -Educated on Benefits of medication for symptom control Benefits of cognitive-behavioral therapy with or without medication -Recommended to continue current medication   Renal Transplant  (Goal: prevent rejection of kidney ) -Managed by Dr. Holley Raring  -Controlled -Current treatment  Mycophenolate 500 mg 2 tablets twice daily  Tacrolimus 1 mg 3 capsules twice daily  -Medications previously tried: Myfortic (cost) -Drug interactions evaluated, no significant drug interactions noted with current regimen.  -Recommended to continue current medication  Tobacco use (Goal Quit Smoking) -Controlled -Previous quit attempts: Chantix, Nicotine Patch (never started) -Current treatment  None -Patient smokes Within 30 minutes of waking -Patient triggers include: boredom  and finishing a meal -Counseled on identifying alternative stress management tools such as walking, reading or exercising   Allergic Rhinitis (Goal: Minimize symptoms) -Not ideally controlled -Current treatment  None -Medications previously tried: Flonase (ineffective), Zyrtec (ineffective) -Symptoms significantly worse. Patient reports itchy eyes, congestion.  -Recommended claritin 10 mg daily  Chronic Kidney Disease Stage 3a  -All medications assessed for renal dosing and appropriateness in chronic kidney disease. -Recommended to continue current medication  Patient Goals/Self-Care Activities Over the next 90 days, patient will:  - check glucose 2-3 times daily , document, and provide at future appointments -check blood pressure 2-3  times weekly , document, and provide at future appointments -decrease cigarette use   Follow Up Plan: Telephone follow up appointment with care management team member scheduled for:  11/11/2020 at 1:15 PM       Patient agreed to services and verbal consent obtained.   Patient verbalizes understanding of instructions provided today and agrees to view in [place].   [name], PharmD, [place], [place] [phone]

## 2020-10-19 ENCOUNTER — Telehealth: Payer: Self-pay

## 2020-10-19 DIAGNOSIS — E669 Obesity, unspecified: Secondary | ICD-10-CM | POA: Diagnosis not present

## 2020-10-19 DIAGNOSIS — Z794 Long term (current) use of insulin: Secondary | ICD-10-CM | POA: Diagnosis not present

## 2020-10-19 DIAGNOSIS — I10 Essential (primary) hypertension: Secondary | ICD-10-CM | POA: Diagnosis not present

## 2020-10-19 DIAGNOSIS — E08311 Diabetes mellitus due to underlying condition with unspecified diabetic retinopathy with macular edema: Secondary | ICD-10-CM | POA: Diagnosis not present

## 2020-10-19 DIAGNOSIS — E118 Type 2 diabetes mellitus with unspecified complications: Secondary | ICD-10-CM | POA: Diagnosis not present

## 2020-10-19 DIAGNOSIS — E1121 Type 2 diabetes mellitus with diabetic nephropathy: Secondary | ICD-10-CM | POA: Diagnosis not present

## 2020-10-19 DIAGNOSIS — E785 Hyperlipidemia, unspecified: Secondary | ICD-10-CM | POA: Diagnosis not present

## 2020-10-19 DIAGNOSIS — E1165 Type 2 diabetes mellitus with hyperglycemia: Secondary | ICD-10-CM | POA: Diagnosis not present

## 2020-10-19 DIAGNOSIS — E0859 Diabetes mellitus due to underlying condition with other circulatory complications: Secondary | ICD-10-CM | POA: Diagnosis not present

## 2020-10-19 DIAGNOSIS — I7389 Other specified peripheral vascular diseases: Secondary | ICD-10-CM | POA: Diagnosis not present

## 2020-10-19 NOTE — Chronic Care Management (AMB) (Signed)
10/19/2020- Returning patients call from voicemail,no answer, left message for patient to return my call to help with any questions or concerns he may have and informed that [name] was currently unavailable due to being in clinic with patients at this time. Waiting on return call.   Pattricia Boss, [facility] Pharmacist Assistant [phone]

## 2020-10-21 ENCOUNTER — Other Ambulatory Visit: Payer: Self-pay

## 2020-10-21 ENCOUNTER — Encounter: Payer: Self-pay | Admitting: Cardiovascular Disease

## 2020-10-21 ENCOUNTER — Ambulatory Visit: Payer: Medicare HMO | Admitting: Cardiovascular Disease

## 2020-10-21 VITALS — BP 140/60 | HR 89 | Ht 71.0 in | Wt 224.1 lb

## 2020-10-21 DIAGNOSIS — Z720 Tobacco use: Secondary | ICD-10-CM

## 2020-10-21 DIAGNOSIS — I739 Peripheral vascular disease, unspecified: Secondary | ICD-10-CM

## 2020-10-21 DIAGNOSIS — E785 Hyperlipidemia, unspecified: Secondary | ICD-10-CM | POA: Diagnosis not present

## 2020-10-21 DIAGNOSIS — I10 Essential (primary) hypertension: Secondary | ICD-10-CM

## 2020-10-21 DIAGNOSIS — N186 End stage renal disease: Secondary | ICD-10-CM | POA: Diagnosis not present

## 2020-10-21 DIAGNOSIS — I779 Disorder of arteries and arterioles, unspecified: Secondary | ICD-10-CM

## 2020-10-21 DIAGNOSIS — I25118 Atherosclerotic heart disease of native coronary artery with other forms of angina pectoris: Secondary | ICD-10-CM | POA: Diagnosis not present

## 2020-10-21 NOTE — Patient Instructions (Addendum)
Medication Instructions:  No changes  If you need a refill on your cardiac medications before your next appointment, please call your pharmacy.    Lab work: No new labs needed   If you have labs (blood work) drawn today and your tests are completely normal, you will receive your results only by: [place] (if you have MyChart) OR A paper copy in the mail If you have any lab test that is abnormal or we need to change your treatment, we will call you to review the results.   Testing/Procedures: - Your physician has requested that you have a carotid duplex in October. This test is an ultrasound of the carotid arteries in your neck. It looks at blood flow through these arteries that supply the brain with blood. Allow one hour for this exam. There are no restrictions or special instructions.    Follow-Up: At [facility], you and your health needs are our priority.  As part of our continuing mission to provide you with exceptional heart care, we have created designated Provider Care Teams.  These Care Teams include your primary Cardiologist (physician) and Advanced Practice Providers (APPs -  Physician Assistants and Nurse Practitioners) who all work together to provide you with the care you need, when you need it.  You will need a follow up appointment in 12 months  Providers on your designated Care Team:   Murray Hodgkins, NP Christell Faith, PA-C Marrianne Mood, PA-C Cadence Kathlen Mody, [place]  Any Other Special Instructions Will Be Listed Below (If Applicable).  COVID-19 Vaccine Information can be found at: ShippingScam.co.uk For questions related to vaccine distribution or appointments, please email [email or web address] or call [phone].

## 2020-10-21 NOTE — Progress Notes (Signed)
Evaluation Performed:  Follow-up visit  Date:  10/21/2020   ID:  James Moreno, DOB 29-Jun-1965, MRN [id number]  Patient Location:  [place] [id number]   Provider location:   [facility], [place] office  PCP:  Birdie Sons, MD  Cardiologist:  Arvid Right [facility]  Chief Complaint  Patient presents with   6 month follow up     "Doing well." Medicatioins reviewed by the patient verbally.     History of Present Illness:    James Moreno is a 55 y.o. male  past medical history of Diabetes type 2 previously on peritoneal hemodialysis,   kidney transplant April 2016 in [place],  hypertension ,  long smoking history who continues to smoke,  Bilateral carotid disease,  50% LE arterial disease Covid 10/2019 evaluated for chest pain in the past, presenting for routine followup of his PAD.   Last seen in clinic feb 2022 In follow-up today reports that he feels well Hobbies include guitar, in band [employer]cycle  Focusing on his diet Losing weight  Labs reviewed A1C 8.8 to 7.2 CR 1.72 Total chol 141, LDL 53  Continues to smoke, followed by  pharmacist  Going to start nicotine patch  Discussed carotid ultrasound images, moderate disease noted  EKG personally reviewed by myself on todays visit NSr rate 88 bpm, consider old anterior MI   Other past medical history  previously required iron infusions   history of  chronic discomfort in his legs. Reports having ABIs with Dr. Francoise Schaumann    Prior CV studies:   The following studies were reviewed today:  Echocardiogram over the past 6 months was essentially normal, normal ejection fraction estimated at greater than 55% . This was done in preparation for kidney transplant listing .    stress test in 2013 showed no ischemia, done at [facility], repeat stress test in the past month or so at [place] system  Did stress test in the past on treadmill, had trouble Also  did lexiscan 2015, had severe SOB    Past Medical History:  Diagnosis Date   Acute kidney failure, unspecified ([place])    Anxiety    Diabetes mellitus, type 2 (HCC)    GERD (gastroesophageal reflux disease)    History of arterial disease of lower extremity    History of hepatitis B    Hypertension    Hypothyroidism    Mitral valve disorders(424.0)    Pityriasis 12/27/2014   Primary pulmonary HTN ([place])    Pt denies ever having.   Tricuspid valve disorders, specified as nonrheumatic    Past Surgical History:  Procedure Laterality Date   APPENDECTOMY     Carotid Doppler Ultrasound  06/14/2009   39% stenosis of bilateral internal carotid artery, bilateral anterograde vertebral flow   ESOPHAGOGASTRODUODENOSCOPY (EGD) WITH PROPOFOL N/A 01/21/2019   Procedure: ESOPHAGOGASTRODUODENOSCOPY (EGD) WITH PROPOFOL;  Surgeon: Lin Landsman, MD;  Location: [place];  Service: Endoscopy;  Laterality: N/A;  Diabetic - insulin   EYE SURGERY     right   HEMORROIDECTOMY     KIDNEY TRANSPLANT Right 2016   MECKEL DIVERTICULUM EXCISION     infancy   Myocardial Perfusion scan  01/17/2009   [facility], non- ischemic. LVEF= 55%   PARS PLANA VITRECTOMY Left 02/09/2015   Procedure: Pan retinal photocoagulation 78295;  Surgeon: Milus Height, MD;  Location: [facility];  Service: Ophthalmology;  Laterality: Left;   REFRACTIVE SURGERY Left  sleep study  01/09/2011   Severe sleep apnea. AHI 72.9/hr. RDI=83.0/hr. Desaturation to 69.0% Emergency CPAP titaration to 14.0cm (01/14/19 resolved after wt loss after kidney transplant.)     Current Meds  Medication Sig   alprazolam (XANAX) 2 MG tablet TAKE 1 TABLET BY MOUTH THREE TIMES DAILY AS NEEDED FOR SLEEP   amLODipine (NORVASC) 10 MG tablet Take 10 mg by mouth daily.    B-D ULTRAFINE III SHORT PEN 31G X 8 MM MISC    BD INSULIN SYRINGE U/F 31G X 5/16" 1 ML MISC    Blood Glucose Monitoring Suppl (GLUCOCOM BLOOD GLUCOSE MONITOR) DEVI  Frequency:ONCE   Dosage:0.0     Instructions:  Note:Dose: N/A   calcitRIOL (ROCALTROL) 0.25 MCG capsule    carvedilol (COREG) 6.25 MG tablet TAKE 1 TABLET TWICE A DAY WITH MEALS (NEED APPOINTMENT FOR FURTHER REFILLS)   cinacalcet (SENSIPAR) 30 MG tablet Take 30 mg by mouth daily.   EDEX 40 MCG injection 40 mcg by Intracavitary route as needed.    insulin aspart (NOVOLOG) 100 UNIT/ML injection Inject 15 Units into the skin 3 (three) times daily before meals.   insulin degludec (TRESIBA FLEXTOUCH) 100 UNIT/ML FlexTouch Pen Inject 30 Units into the skin daily.   Lancets (ONETOUCH DELICA PLUS [id number]) MISC    losartan (COZAAR) 100 MG tablet Take 1 tablet (100 mg total) by mouth at bedtime.   mycophenolate (CELLCEPT) 500 MG tablet Take 1,000 mg by mouth 2 (two) times daily.   naloxone (NARCAN) nasal spray 4 mg/0.1 mL Place 1 spray into the nose once.    nicotine (NICODERM CQ) 14 mg/24hr patch Place 1 patch (14 mg total) onto the skin daily.   omeprazole (PRILOSEC) 20 MG capsule Take 2 capsules (40 mg total) by mouth 2 (two) times daily before a meal.   ONETOUCH VERIO test strip    oxyCODONE (OXY IR/ROXICODONE) 5 MG immediate release tablet Take 1-2 tablets (5-10 mg total) by mouth every 6 (six) hours as needed for severe pain.   rosuvastatin (CRESTOR) 40 MG tablet Take 1 tablet (40 mg total) by mouth daily.   sildenafil (VIAGRA) 100 MG tablet TAKE ONE-HALF (1/2) TO ONE TABLET DAILY AS NEEDED FOR ERECTILE DYSFUNCTION   tacrolimus (PROGRAF) 1 MG capsule Take 3 mg by mouth 2 (two) times daily.    tretinoin (RETIN-A) 0.05 % cream Apply topically as needed.    triamcinolone cream (KENALOG) 0.1 % APPLY DAILY TO INFLAMED BUMPS AS NEEDED   XYOSTED 100 MG/0.5ML SOAJ 0.5 mLs once a week. Every sunday     Allergies:   No known allergies   Social History   Tobacco Use   Smoking status: Every Day    Packs/day: 0.75    Years: 38.00    Pack years: 28.50    Types: Cigarettes   Smokeless tobacco: Never    Tobacco comments:    since age 65.  Vaping Use   Vaping Use: Former  Substance Use Topics   Alcohol use: Yes    Alcohol/week: 0.0 - 1.0 standard drinks    Comment: Excessive alcohol consumption in the past. Quit around 2016. Drinks occasionally.   Drug use: No     Family Hx: The patient's family history includes Hyperlipidemia in his mother; Hypertension in his mother; Melanoma in his father.  ROS:   Please see the history of present illness.    Review of Systems  Constitutional: Negative.   HENT: Negative.    Respiratory: Negative.    Cardiovascular: Negative.  Gastrointestinal: Negative.   Musculoskeletal: Negative.   Neurological: Negative.   Psychiatric/Behavioral: Negative.    All other systems reviewed and are negative.   Labs/Other Tests and Data Reviewed:    Recent Labs: 06/20/2020: BUN 29; Creatinine, Ser 1.72; Potassium 4.2; Sodium 139   Recent Lipid Panel Lab Results  Component Value Date/Time   CHOL 141 06/20/2020 09:03 AM   TRIG 268 (H) 06/20/2020 09:03 AM   HDL 46 06/20/2020 09:03 AM   CHOLHDL 3.1 06/20/2020 09:03 AM   LDLCALC 53 06/20/2020 09:03 AM    Wt Readings from Last 3 Encounters:  10/21/20 224 lb 2 oz (101.7 kg)  10/14/20 226 lb 12.8 oz (102.9 kg)  06/20/20 222 lb 9.6 oz (101 kg)     Exam:    Vital Signs: Vital signs may also be detailed in the HPI BP 140/60 (BP Location: Left Arm, Patient Position: Sitting, Cuff Size: Normal)   Pulse 89   Ht 5\' 11"  (1.803 m)   Wt 224 lb 2 oz (101.7 kg)   SpO2 98%   BMI 31.26 kg/m   Constitutional:  oriented to person, place, and time. No distress.  HENT:  Head: Grossly normal Eyes:  no discharge. No scleral icterus.  Neck: No JVD, no carotid bruits  Cardiovascular: Regular rate and rhythm, no murmurs appreciated Pulmonary/Chest: Clear to auscultation bilaterally, no wheezes or rails Abdominal: Soft.  no distension.  no tenderness.  Musculoskeletal: Normal range of motion Neurological:   normal muscle tone. Coordination normal. No atrophy Skin: Skin warm and dry Psychiatric: normal affect, pleasant   ASSESSMENT & PLAN:    PAD (peripheral artery disease) (HCC) Chronic stable claudication symptoms, previously evaluated by Dr. Fletcher Anon Denies any escalation in symptoms Strong recommendation for smoking cessation  Atherosclerosis of artery of extremity with ulceration (HCC) Chronic stable claudication sx, fine on flat surface  Bilateral carotid artery disease, unspecified type (HCC) Stable moderate dx Smoking cessation recommended, cholesterol at goal Diabetes numbers improving  Mixed hyperlipidemia Cholesterol is at goal on the current lipid regimen. No changes to the medications were made.  Hypertension secondary to other renal disorders Blood pressure is well controlled on today's visit. No changes made to the medications.  Chest discomfort Denies any anginal symptoms on today's visit Discussed return precautions for unstable angina   Total encounter time more than 25 minutes  Greater than 50% was spent in counseling and coordination of care with the patient   Signed, Ida Rogue, MD  10/21/2020 12:47 PM    [place] Office [address], [place], [place] [address]

## 2020-10-24 ENCOUNTER — Ambulatory Visit: Payer: Medicare HMO | Admitting: Podiatry

## 2020-10-28 DIAGNOSIS — L578 Other skin changes due to chronic exposure to nonionizing radiation: Secondary | ICD-10-CM | POA: Diagnosis not present

## 2020-10-28 DIAGNOSIS — L810 Postinflammatory hyperpigmentation: Secondary | ICD-10-CM | POA: Diagnosis not present

## 2020-11-01 ENCOUNTER — Telehealth: Payer: Self-pay

## 2020-11-01 DIAGNOSIS — Z940 Kidney transplant status: Secondary | ICD-10-CM | POA: Diagnosis not present

## 2020-11-01 DIAGNOSIS — N186 End stage renal disease: Secondary | ICD-10-CM | POA: Diagnosis not present

## 2020-11-01 DIAGNOSIS — N2581 Secondary hyperparathyroidism of renal origin: Secondary | ICD-10-CM | POA: Diagnosis not present

## 2020-11-01 DIAGNOSIS — I10 Essential (primary) hypertension: Secondary | ICD-10-CM | POA: Diagnosis not present

## 2020-11-01 NOTE — Progress Notes (Signed)
    Chronic Care Management Pharmacy Assistant   Name: James Moreno  MRN: [id number] DOB: [birth year]  11/10/2020 Patient called to be remind of his appointment with [name], [name] on 11/11/2020 @ 1315 via telephone.   No answer, left message of appointment date, time and type of appointment (either telephone or in person). Left message to have all medications, supplements, blood pressure and/or blood sugar logs available during appointment and to return call if need to reschedule.  Questions: Are there any concerns you would like to discuss during your office visit?  Are you having any problems obtaining your medications? (Whether it pharmacy issues or cost)  If patient has any PAP medications ask if they are having any problems getting their PAP medication or refill?  Star Rating Drug: Rosuvastatin 40 mg last filled on 09/16/2020 for a 90-Day supply with [place] Losartan 100 mg last filled on 06/21/2020 for a 90-Day supply no pharmacy indicated  Any gaps in medications fill history? Yes   08/09 [name], [name] sent me a message stating the patient had left a message requesting a call back. I reached out to the patient with no success, but I was able to leave a voice mail requesting the patient to return my call.

## 2020-11-03 ENCOUNTER — Other Ambulatory Visit: Payer: Self-pay | Admitting: Family Medicine

## 2020-11-03 DIAGNOSIS — L905 Scar conditions and fibrosis of skin: Secondary | ICD-10-CM

## 2020-11-03 DIAGNOSIS — M79604 Pain in right leg: Secondary | ICD-10-CM

## 2020-11-03 NOTE — Telephone Encounter (Signed)
Requested medication (s) are due for refill today -yes  Requested medication (s) are on the active medication list -yes  Future visit scheduled -yes  Last refill: 10/10/20  Notes to clinic: Request RF- non delegated Rx  Requested Prescriptions  Pending Prescriptions Disp Refills   oxyCODONE (OXY IR/ROXICODONE) 5 MG immediate release tablet 240 tablet 0    Sig: Take 1-2 tablets (5-10 mg total) by mouth every 6 (six) hours as needed for severe pain.     Not Delegated - Analgesics:  Opioid Agonists Failed - 11/03/2020  3:44 PM      Failed - This refill cannot be delegated      Failed - Urine Drug Screen completed in last 360 days      Passed - Valid encounter within last 6 months    Recent Outpatient Visits           2 weeks ago Type 2 diabetes mellitus with diabetic nephropathy, without long-term current use of insulin ([place])   [facility] Birdie Sons, MD   4 months ago Type 2 diabetes mellitus with diabetic nephropathy, without long-term current use of insulin ([place])   [facility] Birdie Sons, MD   8 months ago Type 2 diabetes mellitus with diabetic nephropathy, with long-term current use of insulin ([place])   [facility] Birdie Sons, MD   1 year ago Type 2 diabetes mellitus with diabetic nephropathy, without long-term current use of insulin ([place])   [facility] Birdie Sons, MD   1 year ago Mixed hyperlipidemia   [facility] Birdie Sons, MD       Future Appointments             In 5 months Fisher, Kirstie Peri, MD [facility]               Requested Prescriptions  Pending Prescriptions Disp Refills   oxyCODONE (OXY IR/ROXICODONE) 5 MG immediate release tablet 240 tablet 0    Sig: Take 1-2 tablets (5-10 mg total) by mouth every 6 (six) hours as needed for severe pain.     Not Delegated - Analgesics:  Opioid Agonists Failed - 11/03/2020  3:44 PM      Failed  - This refill cannot be delegated      Failed - Urine Drug Screen completed in last 360 days      Passed - Valid encounter within last 6 months    Recent Outpatient Visits           2 weeks ago Type 2 diabetes mellitus with diabetic nephropathy, without long-term current use of insulin ([place])   [facility] Birdie Sons, MD   4 months ago Type 2 diabetes mellitus with diabetic nephropathy, without long-term current use of insulin ([place])   [facility] Birdie Sons, MD   8 months ago Type 2 diabetes mellitus with diabetic nephropathy, with long-term current use of insulin [facility])   [facility] Birdie Sons, MD   1 year ago Type 2 diabetes mellitus with diabetic nephropathy, without long-term current use of insulin [facility])   [facility] Birdie Sons, MD   1 year ago Mixed hyperlipidemia   [place], Kirstie Peri, MD       Future Appointments             In 5 months Fisher, Kirstie Peri, MD [facility], [place]

## 2020-11-03 NOTE — Telephone Encounter (Signed)
Copied from [place] [phone]. Topic: Quick Communication - Rx Refill/Question >> Nov 03, 2020  2:45 PM Yvette Rack wrote: Medication: oxyCODONE (OXY IR/ROXICODONE) 5 MG immediate release tablet  Has the patient contacted their pharmacy? No. (Agent: If no, request that the patient contact the pharmacy for the refill.) (Agent: If yes, when and what did the pharmacy advise?)  Preferred Pharmacy (with phone number or street name): [place], [place]  Phone: [phone]   Fax: [phone]  Agent: Please be advised that RX refills may take up to 3 business days. We ask that you follow-up with your pharmacy.

## 2020-11-11 ENCOUNTER — Telehealth: Payer: Self-pay

## 2020-11-11 NOTE — Progress Notes (Deleted)
Chronic Care Management Pharmacy Note  11/11/2020 Name:  James Moreno MRN:  [id number] DOB:  09/12/1965  Summary: Patient presents for CCM follow-up. He continues to use the [employer] 2 CGM to monitor his blood sugars. He reports being frustrated over the highs and lows he has been seeing. He has follow-up with endocrinology and cardiology this week.  Recommendations/Changes made from today's visit: Continues current medications  Plan: [name] follow-up in one month  Subjective: James Moreno is an 55 y.o. year old male who is a primary patient of Fisher, Kirstie Peri, MD.  The CCM team was consulted for assistance with disease management and care coordination needs.    Engaged with patient by telephone for follow up visit in response to provider referral for pharmacy case management and/or care coordination services.   Consent to Services:  The patient was given information about Chronic Care Management services, agreed to services, and gave verbal consent prior to initiation of services.  Please see initial visit note for detailed documentation.   Patient Care Team: Birdie Sons, MD as PCP - General (Family Medicine) Rockey Situ Kathlene November, MD as PCP - Cardiology (Cardiology) Ronnald Collum, Lourdes Sledge, MD as Attending Physician (Endocrinology) Pa, Reddick (Optometry) Anthonette Legato, MD (Nephrology) Long, Thomes Cake, MD as Referring Physician (Vascular Surgery) Isaias Sakai, MD as Referring Physician (Ophthalmology) Lin Landsman, MD as Consulting Physician (Gastroenterology) Wellington Hampshire, MD as Consulting Physician (Cardiology) Germaine Pomfret, [facility] (Pharmacist)  Recent office visits: 06/20/20: Patient presented to Dr. Caryn Section for follow-up. Chantix stopped, patient started on Nicotine Patch 21 mg daily.  02/12/20: Video visit with Dr. Caryn Section for follow-up. Patient given Lantus 30 units daily due to cost .   Recent consult visits: 11/01/20: Patient  presented to Dr. Holley Raring (Nephrology). [name] 10 mg daily added. 10/21/20: Patient presented to Dr. Rockey Situ (Cardiology) for follow-up.  07/21/20: Patient presented to Dr. Holley Raring (Nephrology) for follow-up.  06/01/20: Patient presented to Dr. Ronnald Collum (Endocrinology) for initial visit.  05/12/20: Patient presented to Laurann Montana, NP for follow-up. Claudication stable.   Hospital visits: None in previous 6 months  Objective:  Lab Results  Component Value Date   CREATININE 1.72 (H) 06/20/2020   BUN 29 (H) 06/20/2020   GFRNONAA 39 (L) 09/17/2019   GFRAA 45 (L) 09/17/2019   NA 139 06/20/2020   K 4.2 06/20/2020   CALCIUM 10.7 (H) 06/20/2020   CO2 21 06/20/2020    Lab Results  Component Value Date/Time   HGBA1C 8.8 05/25/2020 12:00 AM   HGBA1C 9.7 (H) 09/17/2019 02:17 PM   HGBA1C 6.7 12/12/2018 12:00 AM   MICROALBUR 100 12/31/2016 02:13 PM    Last diabetic Eye exam:  Lab Results  Component Value Date/Time   HMDIABEYEEXA Retinopathy (A) 02/15/2020 12:00 AM    Last diabetic Foot exam: No results found for: HMDIABFOOTEX   Lab Results  Component Value Date   CHOL 141 06/20/2020   HDL 46 06/20/2020   LDLCALC 53 06/20/2020   TRIG 268 (H) 06/20/2020   CHOLHDL 3.1 06/20/2020    Hepatic Function Latest Ref Rng & Units 06/20/2020 09/17/2019 12/12/2018  Total Protein 6.0 - 8.5 g/dL - 6.5 -  Albumin 3.8 - 4.9 g/dL 4.2 4.3 -  AST 0 - 40 IU/L - 9 14  ALT 0 - 44 IU/L - 9 16  Alk Phosphatase 48 - 121 IU/L - 131(H) 95  Total Bilirubin 0.0 - 1.2 mg/dL - 0.6 -    Lab Results  Component Value Date/Time   TSH 1.34 12/12/2018 12:00 AM   TSH 1.72 06/13/2006 12:00 AM    CBC Latest Ref Rng & Units 12/12/2018 10/21/2015 06/21/2013  WBC - 6.7 8.9 9.7  Hemoglobin 13.5 - 17.5 14.8 16.3 10.1(L)  Hematocrit 41 - 53 43 46.7 28.9(L)  Platelets 150 - 399 182 140(L) 175    No results found for: VD25OH  Clinical ASCVD: Yes  The 10-year ASCVD risk score Mikey Bussing DC Jr., et al., 2013) is: 19.3%    Values used to calculate the score:     Age: 19 years     Sex: Male     Is Non-Hispanic African American: No     Diabetic: Yes     Tobacco smoker: Yes     Systolic Blood Pressure: 010 mmHg     Is BP treated: Yes     HDL Cholesterol: 46 mg/dL     Total Cholesterol: 141 mg/dL    Depression screen [facility] 2/9 10/14/2020 01/19/2020 01/06/2019  Decreased Interest 0 0 0  Down, Depressed, Hopeless 0 0 0  PHQ - 2 Score 0 0 0  Altered sleeping 0 - -  Tired, decreased energy 0 - -  Change in appetite 0 - -  Feeling bad or failure about yourself  0 - -  Trouble concentrating 0 - -  Moving slowly or fidgety/restless 0 - -  Suicidal thoughts 0 - -  PHQ-9 Score 0 - -  Difficult doing work/chores Not difficult at all - -  Some recent data might be hidden      Social History   Tobacco Use  Smoking Status Every Day   Packs/day: 0.75   Years: 38.00   Pack years: 28.50   Types: Cigarettes  Smokeless Tobacco Never  Tobacco Comments   since age 29.   BP Readings from Last 3 Encounters:  10/21/20 140/60  10/14/20 (!) 164/76  06/20/20 124/60   Pulse Readings from Last 3 Encounters:  10/21/20 89  10/14/20 86  06/20/20 91   Wt Readings from Last 3 Encounters:  10/21/20 224 lb 2 oz (101.7 kg)  10/14/20 226 lb 12.8 oz (102.9 kg)  06/20/20 222 lb 9.6 oz (101 kg)    Assessment/Interventions: Review of patient past medical history, allergies, medications, health status, including review of consultants reports, laboratory and other test data, was performed as part of comprehensive evaluation and provision of chronic care management services.   SDOH:  (Social Determinants of Health) assessments and interventions performed: Yes     CCM Care Plan  Allergies  Allergen Reactions   No Known Allergies     Medications Reviewed Today     Reviewed by Anselm Pancoast, CMA (Certified Medical Assistant) on 10/21/20 at 11  Med List Status: <None>   Medication Order Taking? Sig Documenting  Provider Last Dose Status Informant  alprazolam (XANAX) 2 MG tablet [id number] Yes TAKE 1 TABLET BY MOUTH THREE TIMES DAILY AS NEEDED FOR SLEEP Birdie Sons, MD Taking Active   amLODipine (NORVASC) 10 MG tablet [id number] Yes Take 10 mg by mouth daily.  [provider] Taking Active   B-D ULTRAFINE III SHORT PEN 31G X 8 MM MISC [id number] Yes  [provider] Taking Active   BD INSULIN SYRINGE U/F 31G X 5/16" 1 ML [place] [id number] Yes  [provider] Taking Active   Blood Glucose Monitoring Suppl ([place]) DEVI [id number] Yes Frequency:ONCE   Dosage:0.0     Instructions:  Note:Dose:  N/A [provider] Taking Active Self           Med Note Jeanie Cooks Oct 30, 2017 11:50 AM)    calcitRIOL (ROCALTROL) 0.25 MCG capsule [id number] Yes  [provider] Taking Active   carvedilol (COREG) 6.25 MG tablet [id number] Yes TAKE 1 TABLET TWICE A DAY WITH MEALS (NEED APPOINTMENT FOR FURTHER REFILLS) Minna Merritts, MD Taking Active            Med Note Tyrone Sage May 12, 2020 11:06 AM)    cinacalcet (SENSIPAR) 30 MG tablet [id number] Yes Take 30 mg by mouth daily. [provider] Taking Active Self  EDEX 40 MCG injection [id number] Yes 40 mcg by Intracavitary route as needed.  [provider] Taking Active   insulin aspart (NOVOLOG) 100 UNIT/ML injection [id number] Yes Inject 15 Units into the skin 3 (three) times daily before meals. [provider] Taking Active Self           Med Note Raeford Razor Aug 15, 2020  3:50 PM) Frances Maywood through [name] Patient Assistance through Dec 2022   insulin degludec [facility]) 100 UNIT/ML FlexTouch Pen [id number] Yes Inject 30 Units into the skin daily. [provider] Taking Active            Med Note Raeford Razor Aug 15, 2020  3:50 PM) Frances Maywood through [name] Patient Assistance through Dec 2022   Lancets [facility] Donaciano Eva PLUS [id number]) [place] [id number] Yes  [provider] Taking Active   losartan (COZAAR) 100 MG tablet [id number] Yes Take 1 tablet (100 mg total) by mouth at bedtime. Rise Mu, PA-C Taking Active   mycophenolate (CELLCEPT) 500 MG tablet [id number] Yes Take 1,000 mg by mouth 2 (two) times daily. [provider] Taking Active   naloxone [facility]) nasal spray 4 mg/0.1 mL [id number] Yes Place 1 spray into the nose once.  [provider] Taking Active   nicotine (NICODERM CQ) 14 mg/24hr patch [id number] Yes Place 1 patch (14 mg total) onto the skin daily. Birdie Sons, MD Taking Active   omeprazole (PRILOSEC) 20 MG capsule [id number] Yes Take 2 capsules (40 mg total) by mouth 2 (two) times daily before a meal. Lin Landsman, MD Taking Active   [facility] VERIO test strip [id number] Yes  [provider] Taking Active   oxyCODONE (OXY IR/ROXICODONE) 5 MG immediate release tablet [id number] Yes Take 1-2 tablets (5-10 mg total) by mouth every 6 (six) hours as needed for severe pain. Birdie Sons, MD Taking Active   rosuvastatin (CRESTOR) 40 MG tablet [id number] Yes Take 1 tablet (40 mg total) by mouth daily. Minna Merritts, MD Taking Active   sildenafil (VIAGRA) 100 MG tablet [id number] Yes TAKE ONE-HALF (1/2) TO ONE TABLET DAILY AS NEEDED FOR ERECTILE DYSFUNCTION Birdie Sons, MD Taking Active   tacrolimus (PROGRAF) 1 MG capsule [id number] Yes Take 3 mg by mouth 2 (two) times daily.  [provider] Taking Active Self           Med Note Kenton Kingfisher, Germaine Pomfret Oct 30, 2017 11:51 AM)    tretinoin (RETIN-A) 0.05 % cream [id number] Yes Apply topically as needed.  [provider] Taking Active   triamcinolone cream (KENALOG) 0.1 % [id number] Yes APPLY DAILY TO INFLAMED BUMPS AS NEEDED [provider] Taking Active   XYOSTED 100 MG/0.5ML SOAJ [id number] Yes  0.5 mLs once a week. Every sunday [provider]  Taking Active             Patient Active Problem List   Diagnosis Date Noted   Proliferative retinopathy of left eye due to diabetes mellitus ([place]) 01/28/2019   Esophageal dysphagia    Benign essential hypertension 12/11/2018   Secondary hyperparathyroidism of renal origin ([place]) 12/11/2018   Severe tobacco use disorder 08/01/2018   Atherosclerosis of artery of extremity with ulceration ([place]) 07/23/2018   Gastroesophageal reflux disease 05/26/2018   PAD (peripheral artery disease) ([place]) 10/30/2017   Chronic ulcer of heel, right, with unspecified severity ([place]) 10/16/2017   Hepatitis B core antibody positive 07/03/2017   Hypertriglyceridemia 07/03/2017   Chronic, continuous use of opioids 03/28/2016   Anxiety 03/28/2016   Pain in surgical scar 11/07/2015   Bulging eyes 01/24/2015   Leg mass 01/24/2015   Renal transplant, status post 01/24/2015   Carotid arterial disease ([place]) 12/27/2014   Compulsive tobacco user syndrome 12/27/2014   Abnormal EKG 04/06/2013   Erectile dysfunction 11/29/2012   Obesity 11/28/2012   Type 2 diabetes mellitus with diabetic nephropathy ([place]) 11/28/2012   Hypertension 12/21/2011   Hyperlipidemia 12/21/2011   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 10/30/2011   Obstructive apnea 01/09/2011   History of other malignant neoplasm of skin 07/16/2006   Glaucoma 01/13/2006   Episodic paroxysmal anxiety disorder 03/26/1998    Immunization History  Administered Date(s) Administered   Influenza Split 12/05/2010   Influenza,inj,Quad PF,6+ Mos 12/31/2016, 01/27/2019   PFIZER(Purple Top)SARS-COV-2 Vaccination 06/18/2019, 07/09/2019, 02/22/2020   Pneumococcal Polysaccharide-23 10/24/2010   Tdap 12/05/2010    Conditions to be addressed/monitored:  Hypertension, Hyperlipidemia, Diabetes, Coronary Artery Disease, GERD, Anxiety, Tobacco use and History of Renal Transplant   There are no care plans that you recently modified to display for this patient.      Medication Assistance:  Mirian Mo obtained through [name] medication assistance program.  Enrollment ends Dec 2022  Patient's preferred pharmacy is:  [facility] [address] [place] [address] Phone: [phone] Fax: [phone]  [facility] (ESRD Bundle Only) - [place], [address] [place] [address] Phone: [phone] Fax: [place], [address] [place] [address] Phone: [phone] Fax: [facility] ([facility]) - [place], [place] [address] Phone: [phone] Fax: [phone]  [place] [address] [place]), [place] - [place]) [place] [address] Phone: [phone] Fax: [phone]  Uses pill box? Yes Pt endorses 100% compliance  We discussed: Current pharmacy is preferred with insurance plan and patient is satisfied with pharmacy services Patient decided to: Continue current medication management strategy  Care Plan and Follow Up Patient Decision:  Patient agrees to Care Plan and Follow-up.  Plan: Telephone follow up appointment with care management team member scheduled for:  11/11/2020 at 1:15 PM   [place] [phone]  Patient Care Plan: General Pharmacy (Adult)     Problem Identified: Hypertension, Hyperlipidemia, Diabetes, Coronary Artery Disease, GERD, Anxiety, Tobacco use and History of Renal Transplant   Priority: High     Long-Range Goal: Patient-Specific Goal   Start Date: 05/19/2020  Expected End Date: 11/16/2020  This Visit's Progress: On track  Recent Progress: On track  Priority: High  Note:        Current Barriers:  Unable to  independently afford treatment regimen Unable to achieve control of Diabetes   Pharmacist Clinical Goal(s):  Over the next 90 days, patient will verbalize ability to afford treatment regimen achieve control of Diabetes as evidenced by A1c less than 7% through collaboration with PharmD and provider.   Interventions: 1:1 collaboration with Birdie Sons, MD regarding development and update of comprehensive plan of care as evidenced by provider attestation and co-signature Inter-disciplinary care team collaboration (see longitudinal plan of care) Comprehensive medication review performed; medication list updated in electronic medical record  Hypertension (BP goal <130/80) -Controlled -Current treatment: Amlodipine 10 mg daily  Carvedilol 6.25 mg twice daily  Losartan 100 mg daily  -Medications previously tried: NA  -Current home readings: 120-140/70-80  -Denies hypotensive/hypertensive symptoms -Educated on Daily salt intake goal < 2300 mg; Importance of home blood pressure monitoring; -Counseled to monitor BP at home 2-3 times weekly, document, and provide log at future appointments -Recommended to continue current medication  Hyperlipidemia: (LDL goal < 70) -History of PAD, CAD  -Controlled -Current treatment: Rosuvastatin 40 mg daily  -Medications previously tried: NA  -Educated on Importance of limiting foods high in cholesterol; -Recommended to continue current medication  Diabetes (A1c goal <7%) -Uncontrolled -Current medications: Novolog 15 units three times daily + 2 units for every 50 units above 150  Tresiba 30 units daily -Medications previously tried: Lantus (Formulary)  -Current home glucose readings  Target 7/12-7/25 8/6-8/19  Number of days worn ? 14 days 14 14  % of time active ? 70% 74% 88%  Mean Glucose (mg/dL)  229 195  GMI (2-week A1c estimate) = 3.31 + 0.02392 x [mean glucose in mg/dL]  8.8% 8.0%  Glycemic Variability (%CV) ?36% NA 52.7%  Time  above >250 mg/dL <5% 35% 28%  Time above 70-180 mg/dL <25% 29% 17%  Time in range: 70-180 mg/dL >70% 36% 52%  Time below 70 mg/dL <4% 0% 3%  Time below 54 mg/dL <1% 0% 0%  -Patient with large portion of the day with severe hyperglycemia, with 2-3 instances of hypoglycemia due to not eating.  -Reports hypoglycemic symptoms: Clammy, disoriented. -Patient has Endocrinology follow-up in 2 days, will defer medication changes until future visit.  -Recommended to continue current medication  Depression/Anxiety (Goal: Maintain stable mood and sleep) -Controlled -Current treatment: Alprazolam 2 mg three times daily as needed - Sleep  -Medications previously tried/failed: NA -PHQ9: 0 -GAD7: 0 -Educated on Benefits of medication for symptom control Benefits of cognitive-behavioral therapy with or without medication -Recommended to continue current medication   Renal Transplant  (Goal: prevent rejection of kidney ) -Managed by Dr. Holley Raring  -Controlled -Current treatment  Mycophenolate 500 mg 2 tablets twice daily  Tacrolimus 1 mg 3 capsules twice daily  -Medications previously tried: Myfortic (cost) -Drug interactions evaluated, no significant drug interactions noted with current regimen.  -Recommended to continue current medication  Tobacco use (Goal Quit Smoking) -Controlled -Previous quit attempts: Chantix, Nicotine Patch (never started) -Current treatment  None -Patient smokes Within 30 minutes of waking -Patient triggers include: boredom  and finishing a meal -Counseled on identifying alternative stress management tools such as walking, reading or exercising   Allergic Rhinitis (Goal: Minimize symptoms) -Not ideally controlled -Current treatment  None -Medications previously tried: Flonase (ineffective), Zyrtec (ineffective) -Symptoms significantly worse. Patient reports itchy eyes, congestion.  -Recommended claritin 10 mg daily  Chronic Kidney  Disease Stage 3a  -All  medications assessed for renal dosing and appropriateness in chronic kidney disease. -Recommended to continue current medication  Patient Goals/Self-Care Activities Over the next 90 days, patient will:  - check glucose 2-3 times daily , document, and provide at future appointments -check blood pressure 2-3 times weekly , document, and provide at future appointments -decrease cigarette use   Follow Up Plan: Telephone follow up appointment with care management team member scheduled for:  11/11/2020 at 1:15 PM

## 2020-11-15 ENCOUNTER — Encounter: Payer: Self-pay | Admitting: Family Medicine

## 2020-11-15 ENCOUNTER — Telehealth: Payer: Self-pay

## 2020-11-15 DIAGNOSIS — G8928 Other chronic postprocedural pain: Secondary | ICD-10-CM | POA: Insufficient documentation

## 2020-11-15 NOTE — Progress Notes (Signed)
    Chronic Care Management Pharmacy Assistant   Name: James Moreno  MRN: [id number] DOB: March 17, 1966  08/24 Spoke to the patient and I was able to reschedule him for 09/16 @ 1200. Patient also reports that his Tyler Aas has been increased from 30 to 69 and he is on patient assistance and will run out before he normally should. I sent a message to [name], [name] informing him of this and to see what steps we need to take in order to get Novo Nordisk to send him more of this medication prior to him running out. Per [name], [name] in order for patient to get more medication their is an reorder form that I need to complete and e-mail to him so he can have Dr. Caryn Section sign off on it.   E-mail with application for reorder for Tyler Aas was sent to [name], [name] for him to have Dr. Caryn Section sign and send over to [name].   11/15/2020 Patient left a VM requesting a call back in order to reschedule his missed appointment with [name], [name]. He also left in that same message that his Endo provider increased his Tyler Aas to 45 units.  I reached out to the patient to get him rescheduled with [name], [name], but had to leave a message requesting the patient to call me back.  Lynann Bologna, CPA/CMA Clinical Pharmacist Assistant Phone: [phone]

## 2020-11-18 ENCOUNTER — Telehealth: Payer: Self-pay

## 2020-11-18 DIAGNOSIS — E1121 Type 2 diabetes mellitus with diabetic nephropathy: Secondary | ICD-10-CM | POA: Diagnosis not present

## 2020-11-18 DIAGNOSIS — Z794 Long term (current) use of insulin: Secondary | ICD-10-CM | POA: Diagnosis not present

## 2020-11-18 NOTE — Telephone Encounter (Signed)
Hi Tosha,   Did you call this pt?  Thanks,   -Mickel Baas

## 2020-11-18 NOTE — Telephone Encounter (Signed)
Copied from [place] [phone]. Topic: General - Other >> Nov 17, 2020  4:29 PM Leward Quan A wrote: Reason for CRM: Patient called said he had a missed call from the office no message left. Can be reached at Ph# [phone]

## 2020-11-18 NOTE — Telephone Encounter (Signed)
Copied from [place] [phone]. Topic: General - Other >> Nov 18, 2020  4:33 PM Erick Blinks wrote: Reason for CRM: Pt says he will be by the office Monday to retrieve his insulin.

## 2020-11-22 ENCOUNTER — Telehealth: Payer: Self-pay

## 2020-11-22 NOTE — Progress Notes (Signed)
    Chronic Care Management Pharmacy Assistant   Name: James Moreno  MRN: [id number] DOB: 1966-02-09 ' The patient called and left a message stating that he can't afford his [place]. Reached out to the patient to clarify and see if he is okay with [place] doing a patient assistant application for this medication. The last time I spoke with the patent he advised that his Tyler Aas had increased from 30 units to 40 units and I was able to fill out a reorder form for [name], [name] to have PCP sign and have it faxed over so the patient would not run out of medication. So I also need to clarify that the patient is still taking the [place] as well.  I had to leave a VM requesting the patient to return my call.   @1534  LVM requesting patient to return my call in regards to the message he left on my PTM's phone regarding his medication [name].   08/31 @1019  LVM requesting patient to return my call in regards to the message he left on my PTM's phone regarding his medication [name].   @1242   LVM requesting patient to return my call in regards to the message he left on my PTM's phone regarding his medication [name].   I have attempted to contact this patient unsuccessfully four times. I have left VM requesting the patient to return my call.  Lynann Bologna, CPA/CMA Clinical Pharmacist Assistant Phone: [phone]

## 2020-11-27 NOTE — Telephone Encounter (Signed)
30 days supply dispensed 11/06/20

## 2020-11-30 ENCOUNTER — Encounter: Payer: Self-pay | Admitting: Podiatry

## 2020-11-30 ENCOUNTER — Ambulatory Visit: Payer: Medicare HMO | Admitting: Podiatry

## 2020-11-30 ENCOUNTER — Other Ambulatory Visit: Payer: Self-pay

## 2020-11-30 DIAGNOSIS — M79676 Pain in unspecified toe(s): Secondary | ICD-10-CM

## 2020-11-30 DIAGNOSIS — B351 Tinea unguium: Secondary | ICD-10-CM

## 2020-11-30 DIAGNOSIS — Z940 Kidney transplant status: Secondary | ICD-10-CM | POA: Diagnosis not present

## 2020-11-30 DIAGNOSIS — N186 End stage renal disease: Secondary | ICD-10-CM | POA: Diagnosis not present

## 2020-11-30 DIAGNOSIS — Z794 Long term (current) use of insulin: Secondary | ICD-10-CM | POA: Diagnosis not present

## 2020-11-30 DIAGNOSIS — I739 Peripheral vascular disease, unspecified: Secondary | ICD-10-CM

## 2020-11-30 DIAGNOSIS — E0859 Diabetes mellitus due to underlying condition with other circulatory complications: Secondary | ICD-10-CM | POA: Diagnosis not present

## 2020-11-30 NOTE — Progress Notes (Signed)
Subjective:  Patient ID: James Moreno, male    DOB: 1966/02/19,  MRN: [id number] HPI Chief Complaint  Patient presents with   Diabetes    Diabetic Foot Exam - last a1c was 7.2 - patient states no complaints   New Patient (Initial Visit)    Est pt 08/2017    55 y.o. male presents with the above complaint.   ROS: Denies fever chills nausea vomit muscle aches pains calf pain back pain chest pain shortness of breath.  Recently had back pain there is resulting in leg pain and was seen by his chiropractor and his girlfriend also helped adjust his back.  Past Medical History:  Diagnosis Date   Acute kidney failure, unspecified ([place])    Anxiety    Diabetes mellitus, type 2 (HCC)    GERD (gastroesophageal reflux disease)    History of arterial disease of lower extremity    History of hepatitis B    Hypertension    Hypothyroidism    Mitral valve disorders(424.0)    Pityriasis 12/27/2014   Primary pulmonary HTN ([place])    Pt denies ever having.   Tricuspid valve disorders, specified as nonrheumatic    Past Surgical History:  Procedure Laterality Date   APPENDECTOMY     Carotid Doppler Ultrasound  06/14/2009   39% stenosis of bilateral internal carotid artery, bilateral anterograde vertebral flow   ESOPHAGOGASTRODUODENOSCOPY (EGD) WITH PROPOFOL N/A 01/21/2019   Procedure: ESOPHAGOGASTRODUODENOSCOPY (EGD) WITH PROPOFOL;  Surgeon: Lin Landsman, MD;  Location: [place];  Service: Endoscopy;  Laterality: N/A;  Diabetic - insulin   EYE SURGERY     right   HEMORROIDECTOMY     KIDNEY TRANSPLANT Right 2016   MECKEL DIVERTICULUM EXCISION     infancy   Myocardial Perfusion scan  01/17/2009   [facility], non- ischemic. LVEF= 55%   PARS PLANA VITRECTOMY Left 02/09/2015   Procedure: Pan retinal photocoagulation 42683;  Surgeon: Milus Height, MD;  Location: [facility];  Service: Ophthalmology;  Laterality: Left;   REFRACTIVE SURGERY Left    sleep study  01/09/2011    Severe sleep apnea. AHI 72.9/hr. RDI=83.0/hr. Desaturation to 69.0% Emergency CPAP titaration to 14.0cm (01/14/19 resolved after wt loss after kidney transplant.)    Current Outpatient Medications:    alprazolam (XANAX) 2 MG tablet, TAKE 1 TABLET BY MOUTH THREE TIMES DAILY AS NEEDED FOR SLEEP, Disp: 90 tablet, Rfl: 3   amLODipine (NORVASC) 10 MG tablet, Take 10 mg by mouth daily. , Disp: , Rfl:    B-D ULTRAFINE III SHORT PEN 31G X 8 MM MISC, , Disp: , Rfl:    BD INSULIN SYRINGE U/F 31G X 5/16" 1 ML MISC, , Disp: , Rfl:    Blood Glucose Monitoring Suppl (GLUCOCOM BLOOD GLUCOSE MONITOR) DEVI, Frequency:ONCE   Dosage:0.0     Instructions:  Note:Dose: N/A, Disp: , Rfl:    calcitRIOL (ROCALTROL) 0.25 MCG capsule, , Disp: , Rfl:    carvedilol (COREG) 6.25 MG tablet, TAKE 1 TABLET TWICE A DAY WITH MEALS (NEED APPOINTMENT FOR FURTHER REFILLS), Disp: 180 tablet, Rfl: 1   cinacalcet (SENSIPAR) 30 MG tablet, Take 30 mg by mouth daily., Disp: , Rfl:    EDEX 40 MCG injection, 40 mcg by Intracavitary route as needed. , Disp: , Rfl:    insulin aspart (NOVOLOG) 100 UNIT/ML injection, Inject 15 Units into the skin 3 (three) times daily before meals., Disp: , Rfl:    insulin degludec (TRESIBA FLEXTOUCH) 100 UNIT/ML FlexTouch Pen, Inject 30  Units into the skin daily., Disp: , Rfl:    Lancets (ONETOUCH DELICA PLUS [id number]) MISC, , Disp: , Rfl:    losartan (COZAAR) 100 MG tablet, Take 1 tablet (100 mg total) by mouth at bedtime., Disp: 90 tablet, Rfl: 2   mycophenolate (CELLCEPT) 500 MG tablet, Take 1,000 mg by mouth 2 (two) times daily., Disp: , Rfl:    naloxone (NARCAN) nasal spray 4 mg/0.1 mL, Place 1 spray into the nose once. , Disp: , Rfl:    nicotine (NICODERM CQ) 14 mg/24hr patch, Place 1 patch (14 mg total) onto the skin daily., Disp: 28 patch, Rfl: 0   omeprazole (PRILOSEC) 20 MG capsule, Take 2 capsules (40 mg total) by mouth 2 (two) times daily before a meal., Disp: 360 capsule, Rfl: 1   ONETOUCH  VERIO test strip, , Disp: , Rfl:    oxyCODONE (OXY IR/ROXICODONE) 5 MG immediate release tablet, Take 1-2 tablets (5-10 mg total) by mouth every 6 (six) hours as needed for severe pain., Disp: 240 tablet, Rfl: 0   rosuvastatin (CRESTOR) 40 MG tablet, Take 1 tablet (40 mg total) by mouth daily., Disp: 90 tablet, Rfl: 1   sildenafil (VIAGRA) 100 MG tablet, TAKE ONE-HALF (1/2) TO ONE TABLET DAILY AS NEEDED FOR ERECTILE DYSFUNCTION, Disp: 30 tablet, Rfl: 3   tacrolimus (PROGRAF) 1 MG capsule, Take 3 mg by mouth 2 (two) times daily. , Disp: , Rfl: 11   tretinoin (RETIN-A) 0.05 % cream, Apply topically as needed. , Disp: , Rfl:    triamcinolone cream (KENALOG) 0.1 %, APPLY DAILY TO INFLAMED BUMPS AS NEEDED, Disp: , Rfl:    XYOSTED 100 MG/0.5ML SOAJ, 0.5 mLs once a week. Every sunday, Disp: , Rfl:   Allergies  Allergen Reactions   No Known Allergies    Review of Systems Objective:  There were no vitals filed for this visit.  General: Well developed, nourished, in no acute distress, alert and oriented x3   Dermatological: Skin is warm, dry and supple bilateral. Nails x 10 are well maintained; remaining integument appears unremarkable at this time. There are no open sores, no preulcerative lesions, no rash or signs of infection present.  Vascular: Dorsalis Pedis artery and Posterior Tibial artery pedal pulses are 2/4 right foot plus 0 out of 4 on the left foot.  With immedate capillary fill time. Pedal hair growth present. No varicosities and no lower extremity edema present bilateral.   Neruologic: Grossly intact via light touch bilateral. Vibratory intact via tuning fork bilateral. Protective threshold with Semmes Wienstein monofilament intact to all pedal sites bilateral. Patellar and Achilles deep tendon reflexes 2+ bilateral. No Babinski or clonus noted bilateral.   Musculoskeletal: No gross boney pedal deformities bilateral. No pain, crepitus, or limitation noted with foot and ankle range of  motion bilateral. Muscular strength 5/5 in all groups tested bilateral.  Gait: Unassisted, Nonantalgic.    Radiographs:  None taken  Assessment & Plan:   Assessment: Diabetes mellitus with peripheral vascular disease left.  Plan: He is already seeing a vascular surgeon/cardiologist for his blockage.  Currently feel that is too risky because of his kidney.  Currently he has no complications peripherally associated with his diabetes other than a nonpalpable pulse but obviously demonstrating good collateral circulation.  Follow-up with him in 6 months     Sufyan Meidinger [employer], [place]

## 2020-11-30 NOTE — Patient Instructions (Signed)

## 2020-12-01 ENCOUNTER — Other Ambulatory Visit: Payer: Self-pay | Admitting: Family Medicine

## 2020-12-01 DIAGNOSIS — M79604 Pain in right leg: Secondary | ICD-10-CM

## 2020-12-01 DIAGNOSIS — R52 Pain, unspecified: Secondary | ICD-10-CM

## 2020-12-01 DIAGNOSIS — L905 Scar conditions and fibrosis of skin: Secondary | ICD-10-CM

## 2020-12-01 NOTE — Telephone Encounter (Signed)
Medication Refill - Medication: oxycodone 5 mg next appt with dr fisher jan 2023 Has the patient contacted their pharmacy? Yes.  Told call provider  (Preferred Pharmacy (with phone number or street name): [facility] [address] in [place] phone number [phone]  Agent: Please be advised that RX refills may take up to 3 business days. We ask that you follow-up with your pharmacy.

## 2020-12-02 ENCOUNTER — Telehealth: Payer: Self-pay

## 2020-12-02 NOTE — Progress Notes (Addendum)
Chronic Care Management Pharmacy Assistant   Name: James Moreno  MRN: [id number] DOB: 05/16/1965  Patient Assistant Application for Wilder Glade  Patient called to inform me that Dr. Anthonette Legato, MD started him on [name] 10 mg daily, but the medication is 95.00 and he just can't afford it so he has never picked it up from [facility].   I informed the patient that I could start the patient assistance application for [name] and he is in agreeable to me starting the process and he stated that he would pick up the application next week at Dr. Sabino Snipes Office. I told him that I would have James Moreno leave it at the front desk for him. I also informed him that since Dr. Holley Raring was the provider who prescribed the [name] that he would have to be the one sign the patient assistant application. [name] stated that he would take the application to Dr. Elwyn Moreno office for his signature and return it back to [facility] for James Moreno to fax over to [name] @[phone].  Application has been completed and emailed to James Moreno, [name] for printing and placing it at the front desk of the providers office for patient to pickup next week.  Medications: Outpatient Encounter Medications as of 12/02/2020  Medication Sig Note   alprazolam (XANAX) 2 MG tablet TAKE 1 TABLET BY MOUTH THREE TIMES DAILY AS NEEDED FOR SLEEP    amLODipine (NORVASC) 10 MG tablet Take 10 mg by mouth daily.     B-D ULTRAFINE III SHORT PEN 31G X 8 MM MISC     BD INSULIN SYRINGE U/F 31G X 5/16" 1 ML MISC     Blood Glucose Monitoring Suppl (GLUCOCOM BLOOD GLUCOSE MONITOR) DEVI Frequency:ONCE   Dosage:0.0     Instructions:  Note:Dose: N/A    calcitRIOL (ROCALTROL) 0.25 MCG capsule     carvedilol (COREG) 6.25 MG tablet TAKE 1 TABLET TWICE A DAY WITH MEALS (NEED APPOINTMENT FOR FURTHER REFILLS)    cinacalcet (SENSIPAR) 30 MG tablet Take 30 mg by mouth daily.    EDEX 40 MCG injection 40 mcg by Intracavitary route as needed.     insulin aspart (NOVOLOG) 100  UNIT/ML injection Inject 15 Units into the skin 3 (three) times daily before meals. 5/23/2022Frances Moreno through [name] Patient Assistance through Dec 2022    insulin degludec (TRESIBA FLEXTOUCH) 100 UNIT/ML FlexTouch Pen Inject 30 Units into the skin daily. 5/23/2022Frances Moreno through [name] Patient Assistance through Dec 2022   Lancets [facility] DELICA PLUS [id number]) MISC     losartan (COZAAR) 100 MG tablet Take 1 tablet (100 mg total) by mouth at bedtime.    mycophenolate (CELLCEPT) 500 MG tablet Take 1,000 mg by mouth 2 (two) times daily.    naloxone (NARCAN) nasal spray 4 mg/0.1 mL Place 1 spray into the nose once.     nicotine (NICODERM CQ) 14 mg/24hr patch Place 1 patch (14 mg total) onto the skin daily.    omeprazole (PRILOSEC) 20 MG capsule Take 2 capsules (40 mg total) by mouth 2 (two) times daily before a meal.    ONETOUCH VERIO test strip     oxyCODONE (OXY IR/ROXICODONE) 5 MG immediate release tablet Take 1-2 tablets (5-10 mg total) by mouth every 6 (six) hours as needed for severe pain.    rosuvastatin (CRESTOR) 40 MG tablet Take 1 tablet (40 mg total) by mouth daily.    sildenafil (VIAGRA) 100 MG tablet TAKE ONE-HALF (1/2) TO ONE TABLET DAILY AS NEEDED FOR  ERECTILE DYSFUNCTION    tacrolimus (PROGRAF) 1 MG capsule Take 3 mg by mouth 2 (two) times daily.     tretinoin (RETIN-A) 0.05 % cream Apply topically as needed.     triamcinolone cream (KENALOG) 0.1 % APPLY DAILY TO INFLAMED BUMPS AS NEEDED    XYOSTED 100 MG/0.5ML SOAJ 0.5 mLs once a week. Every sunday    No facility-administered encounter medications on file as of 12/02/2020.    James Moreno, CPA/CMA Clinical Pharmacist Assistant Phone: [phone]    Addendum: PAP Mailed to patient on 12/02/2020  James Moreno, PharmD, [address] [facility], [place] [phone]

## 2020-12-02 NOTE — Telephone Encounter (Signed)
   Requested medications are on the active medication list yes  Last refill 10/11/20  Last visit 10/14/20  Future visit scheduled 12/09/20  Notes to clinic Not Delegated.

## 2020-12-04 MED ORDER — OXYCODONE HCL 5 MG PO TABS: 5.0000 mg | ORAL_TABLET | Freq: Four times a day (QID) | ORAL | 0 refills | Status: DC | PRN

## 2020-12-08 ENCOUNTER — Telehealth: Payer: Self-pay

## 2020-12-08 NOTE — Progress Notes (Addendum)
    Chronic Care Management Pharmacy Assistant   Name: James Moreno  MRN: [id number] DOB: 1965/10/24  Patient called to be reminded of his appointment with [name], [name] on 12/09/2020 @1200  via telephone.  Patient aware of appointment date, time, and type of appointment (either telephone or in person). Patient aware to have/bring all medications, supplements, blood pressure and/or blood sugar logs to visit.  Questions: Are there any concerns you would like to discuss during your office visit? Nothing that he can think of at this moment.  Are you having any problems obtaining your medications? Yes patient was recently prescribed Tresiba and can't afford this medication PAP was started and sent to his home address for him to complete. Patient stated he is on vacation and not sure if he received the application in the mail yet but he will check today when he gets home.  If patient has any PAP medications ask if they are having any problems getting their PAP medication or refill? No  Star Rating Drug: Losartan 100 mg last filled on 06/21/2020 for a 90-Day supply no pharmacy indicated  Any gaps in medications fill history? Yes  Lynann Bologna, CPA/CMA Clinical Pharmacist Assistant Phone: [phone]   Addendum: 12/14/20: Patient assistance form printed and left with front desk for patient to pick up.  [name], PharmD, [facility], [place] [phone]

## 2020-12-09 ENCOUNTER — Ambulatory Visit (INDEPENDENT_AMBULATORY_CARE_PROVIDER_SITE_OTHER): Payer: Medicare HMO

## 2020-12-09 DIAGNOSIS — N1831 Chronic kidney disease, stage 3a: Secondary | ICD-10-CM

## 2020-12-09 DIAGNOSIS — Z794 Long term (current) use of insulin: Secondary | ICD-10-CM

## 2020-12-09 DIAGNOSIS — E1159 Type 2 diabetes mellitus with other circulatory complications: Secondary | ICD-10-CM

## 2020-12-09 DIAGNOSIS — E1140 Type 2 diabetes mellitus with diabetic neuropathy, unspecified: Secondary | ICD-10-CM

## 2020-12-09 NOTE — Progress Notes (Signed)
Chronic Care Management Pharmacy Note  12/12/2020 Name:  James Moreno MRN:  [id number] DOB:  12/20/1965  Summary: Patient presents for CCM follow-up. His primary concern is his worsening kidney function. He is motivated to improve his diabetes control and is planning to establish with a new endocrinologist. His nephrologist started him on [name], which is unaffordable for him.   Recommendations/Changes made from today's visit: START PAP for [place]: [name] follow-up in one month  Subjective: James Moreno is an 55 y.o. year old male who is a primary patient of Fisher, Kirstie Peri, MD.  The CCM team was consulted for assistance with disease management and care coordination needs.    Engaged with patient by telephone for follow up visit in response to provider referral for pharmacy case management and/or care coordination services.   Consent to Services:  The patient was given information about Chronic Care Management services, agreed to services, and gave verbal consent prior to initiation of services.  Please see initial visit note for detailed documentation.   Patient Care Team: Birdie Sons, MD as PCP - General (Family Medicine) Rockey Situ Kathlene November, MD as PCP - Cardiology (Cardiology) Ronnald Collum, Lourdes Sledge, MD as Attending Physician (Endocrinology) Pa, [place] (Optometry) Anthonette Legato, MD (Nephrology) Long, Thomes Cake, MD as Referring Physician (Vascular Surgery) Isaias Sakai, MD as Referring Physician (Ophthalmology) Lin Landsman, MD as Consulting Physician (Gastroenterology) Wellington Hampshire, MD as Consulting Physician (Cardiology) Germaine Pomfret, [facility] (Pharmacist) Garrel Ridgel, DPM as Consulting Physician (Podiatry)  Recent office visits: 06/20/20: Patient presented to Dr. Caryn Section for follow-up. Chantix stopped, patient started on Nicotine Patch 21 mg daily.  02/12/20: Video visit with Dr. Caryn Section for follow-up. Patient given Lantus 30  units daily due to cost .   Recent consult visits: 11/01/20: Patient presented to Dr. Holley Raring (Nephrology). [name] 10 mg daily added. 10/21/20: Patient presented to Dr. Rockey Situ (Cardiology) for follow-up.  07/21/20: Patient presented to Dr. Holley Raring (Nephrology) for follow-up.  06/01/20: Patient presented to Dr. Ronnald Collum (Endocrinology) for initial visit.  05/12/20: Patient presented to Laurann Montana, NP for follow-up. Claudication stable.   Hospital visits: None in previous 6 months  Objective:  Lab Results  Component Value Date   CREATININE 1.72 (H) 06/20/2020   BUN 29 (H) 06/20/2020   GFRNONAA 39 (L) 09/17/2019   GFRAA 45 (L) 09/17/2019   NA 139 06/20/2020   K 4.2 06/20/2020   CALCIUM 10.7 (H) 06/20/2020   CO2 21 06/20/2020    Lab Results  Component Value Date/Time   HGBA1C 8.8 05/25/2020 12:00 AM   HGBA1C 9.7 (H) 09/17/2019 02:17 PM   HGBA1C 6.7 12/12/2018 12:00 AM   MICROALBUR 100 12/31/2016 02:13 PM    Last diabetic Eye exam:  Lab Results  Component Value Date/Time   HMDIABEYEEXA Retinopathy (A) 02/15/2020 12:00 AM    Last diabetic Foot exam: No results found for: HMDIABFOOTEX   Lab Results  Component Value Date   CHOL 141 06/20/2020   HDL 46 06/20/2020   LDLCALC 53 06/20/2020   TRIG 268 (H) 06/20/2020   CHOLHDL 3.1 06/20/2020    Hepatic Function Latest Ref Rng & Units 06/20/2020 09/17/2019 12/12/2018  Total Protein 6.0 - 8.5 g/dL - 6.5 -  Albumin 3.8 - 4.9 g/dL 4.2 4.3 -  AST 0 - 40 IU/L - 9 14  ALT 0 - 44 IU/L - 9 16  Alk Phosphatase 48 - 121 IU/L - 131(H) 95  Total Bilirubin 0.0 -  1.2 mg/dL - 0.6 -    Lab Results  Component Value Date/Time   TSH 1.34 12/12/2018 12:00 AM   TSH 1.72 06/13/2006 12:00 AM    CBC Latest Ref Rng & Units 12/12/2018 10/21/2015 06/21/2013  WBC - 6.7 8.9 9.7  Hemoglobin 13.5 - 17.5 14.8 16.3 10.1(L)  Hematocrit 41 - 53 43 46.7 28.9(L)  Platelets 150 - 399 182 140(L) 175    No results found for: VD25OH  Clinical ASCVD: Yes  The  10-year ASCVD risk score (Arnett DK, et al., 2019) is: 19.3%   Values used to calculate the score:     Age: 55 years     Sex: Male     Is Non-Hispanic African American: No     Diabetic: Yes     Tobacco smoker: Yes     Systolic Blood Pressure: 338 mmHg     Is BP treated: Yes     HDL Cholesterol: 46 mg/dL     Total Cholesterol: 141 mg/dL    Depression screen [facility] 2/9 10/14/2020 01/19/2020 01/06/2019  Decreased Interest 0 0 0  Down, Depressed, Hopeless 0 0 0  PHQ - 2 Score 0 0 0  Altered sleeping 0 - -  Tired, decreased energy 0 - -  Change in appetite 0 - -  Feeling bad or failure about yourself  0 - -  Trouble concentrating 0 - -  Moving slowly or fidgety/restless 0 - -  Suicidal thoughts 0 - -  PHQ-9 Score 0 - -  Difficult doing work/chores Not difficult at all - -  Some recent data might be hidden      Social History   Tobacco Use  Smoking Status Every Day   Packs/day: 0.75   Years: 38.00   Pack years: 28.50   Types: Cigarettes  Smokeless Tobacco Never  Tobacco Comments   since age 57.   BP Readings from Last 3 Encounters:  10/21/20 140/60  10/14/20 (!) 164/76  06/20/20 124/60   Pulse Readings from Last 3 Encounters:  10/21/20 89  10/14/20 86  06/20/20 91   Wt Readings from Last 3 Encounters:  10/21/20 224 lb 2 oz (101.7 kg)  10/14/20 226 lb 12.8 oz (102.9 kg)  06/20/20 222 lb 9.6 oz (101 kg)    Assessment/Interventions: Review of patient past medical history, allergies, medications, health status, including review of consultants reports, laboratory and other test data, was performed as part of comprehensive evaluation and provision of chronic care management services.   SDOH:  (Social Determinants of Health) assessments and interventions performed: Yes   CCM Care Plan  Allergies  Allergen Reactions   No Known Allergies     Medications Reviewed Today     Reviewed by Rip Harbour, [facility] (Certified Podiatric Assistant) on 11/30/20 at 1348  Med  List Status: <None>   Medication Order Taking? Sig Documenting Provider Last Dose Status Informant  alprazolam (XANAX) 2 MG tablet [id number] No TAKE 1 TABLET BY MOUTH THREE TIMES DAILY AS NEEDED FOR SLEEP Fisher, Kirstie Peri, MD Taking Active   amLODipine (NORVASC) 10 MG tablet [id number] No Take 10 mg by mouth daily.  [provider] Taking Active   B-D ULTRAFINE III SHORT PEN 31G X 8 MM MISC [id number] No  [provider] Taking Active   BD INSULIN SYRINGE U/F 31G X 5/16" 1 ML MISC [id number] No  [provider] Taking Active   Blood Glucose Monitoring Suppl [facility] BLOOD GLUCOSE MONITOR) DEVI [id number] No Frequency:ONCE  Dosage:0.0     Instructions:  Note:Dose: N/A [provider] Taking Active Self           Med Note Kenton Kingfisher, Germaine Pomfret Oct 30, 2017 11:50 AM)    calcitRIOL (ROCALTROL) 0.25 MCG capsule [id number] No  [provider] Taking Active   carvedilol (COREG) 6.25 MG tablet [id number] No TAKE 1 TABLET TWICE A DAY WITH MEALS (NEED APPOINTMENT FOR FURTHER REFILLS) Minna Merritts, MD Taking Active            Med Note Tyrone Sage May 12, 2020 11:06 AM)    cinacalcet (SENSIPAR) 30 MG tablet [id number] No Take 30 mg by mouth daily. [provider] Taking Active Self  EDEX 40 MCG injection [id number] No 40 mcg by Intracavitary route as needed.  [provider] Taking Active   insulin aspart (NOVOLOG) 100 UNIT/ML injection [id number] No Inject 15 Units into the skin 3 (three) times daily before meals. [provider] Taking Active Self           Med Note Raeford Razor Aug 15, 2020  3:50 PM) Frances [place]wood through [name] Patient Assistance through Dec 2022   insulin degludec [facility]) 100 UNIT/ML FlexTouch Pen [id number] No Inject 30 Units into the skin daily. [provider] Taking Active            Med Note Raeford Razor Aug 15, 2020  3:50 PM) Frances Maywood through  [name] Patient Assistance through Dec 2022  Lancets [facility] Donaciano Eva PLUS [id number]) [place] [id number] No  [provider] Taking Active   losartan (COZAAR) 100 MG tablet [id number] No Take 1 tablet (100 mg total) by mouth at bedtime. Rise Mu, PA-C Taking Active   mycophenolate (CELLCEPT) 500 MG tablet [id number] No Take 1,000 mg by mouth 2 (two) times daily. [provider] Taking Active   naloxone [facility]) nasal spray 4 mg/0.1 mL [id number] No Place 1 spray into the nose once.  [provider] Taking Active   nicotine (NICODERM CQ) 14 mg/24hr patch [id number] No Place 1 patch (14 mg total) onto the skin daily. Birdie Sons, MD Taking Active   omeprazole (PRILOSEC) 20 MG capsule [id number] No Take 2 capsules (40 mg total) by mouth 2 (two) times daily before a meal. Lin Landsman, MD Taking Expired 10/21/20 2359   ONETOUCH VERIO test strip [id number] No  [provider] Taking Active   oxyCODONE (OXY IR/ROXICODONE) 5 MG immediate release tablet [id number] No Take 1-2 tablets (5-10 mg total) by mouth every 6 (six) hours as needed for severe pain. Birdie Sons, MD Taking Active   rosuvastatin (CRESTOR) 40 MG tablet [id number] No Take 1 tablet (40 mg total) by mouth daily. Minna Merritts, MD Taking Active   sildenafil (VIAGRA) 100 MG tablet [id number] No TAKE ONE-HALF (1/2) TO ONE TABLET DAILY AS NEEDED FOR ERECTILE DYSFUNCTION Birdie Sons, MD Taking Active   tacrolimus (PROGRAF) 1 MG capsule [id number] No Take 3 mg by mouth 2 (two) times daily.  [provider] Taking Active Self           Med Note Kenton Kingfisher, Germaine Pomfret Oct 30, 2017 11:51 AM)    tretinoin (RETIN-A) 0.05 % cream [id number] No Apply topically as needed.  [provider] Taking Active   triamcinolone cream (KENALOG) 0.1 % [id number] No APPLY DAILY TO INFLAMED BUMPS AS NEEDED [provider]  Taking Active   XYOSTED 100 MG/0.5ML SOAJ [id number] No 0.5 mLs once  a week. Every sunday [provider] Taking Active             Patient Active Problem List   Diagnosis Date Noted   Chronic pain following surgery or procedure 11/15/2020   Proliferative retinopathy of left eye due to diabetes mellitus ([place]) 01/28/2019   Esophageal dysphagia    Benign essential hypertension 12/11/2018   Secondary hyperparathyroidism of renal origin ([place]) 12/11/2018   Severe tobacco use disorder 08/01/2018   Atherosclerosis of artery of extremity with ulceration ([place]) 07/23/2018   Gastroesophageal reflux disease 05/26/2018   PAD (peripheral artery disease) ([place]) 10/30/2017   Chronic ulcer of heel, right, with unspecified severity ([place]) 10/16/2017   Hepatitis B core antibody positive 07/03/2017   Hypertriglyceridemia 07/03/2017   Chronic, continuous use of opioids 03/28/2016   Anxiety 03/28/2016   Pain in surgical scar 11/07/2015   Bulging eyes 01/24/2015   Leg mass 01/24/2015   Renal transplant, status post 01/24/2015   Carotid arterial disease ([place]) 12/27/2014   Compulsive tobacco user syndrome 12/27/2014   Abnormal EKG 04/06/2013   Erectile dysfunction 11/29/2012   Obesity 11/28/2012   Type 2 diabetes mellitus with diabetic nephropathy ([place]) 11/28/2012   Hypertension 12/21/2011   Hyperlipidemia 12/21/2011   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 10/30/2011   Obstructive apnea 01/09/2011   History of other malignant neoplasm of skin 07/16/2006   Glaucoma 01/13/2006   Episodic paroxysmal anxiety disorder 03/26/1998    Immunization History  Administered Date(s) Administered   Influenza Split 12/05/2010   Influenza,inj,Quad PF,6+ Mos 12/31/2016, 01/27/2019   PFIZER(Purple Top)SARS-COV-2 Vaccination 06/18/2019, 07/09/2019, 02/22/2020   Pneumococcal Polysaccharide-23 10/24/2010   Tdap 12/05/2010    Conditions to be addressed/monitored:  Hypertension, Hyperlipidemia, Diabetes, Coronary Artery Disease, GERD, Anxiety, Tobacco use and History of  Renal Transplant   Care Plan : General Pharmacy (Adult)  Updates made by Germaine Pomfret, RPH since 12/12/2020 12:00 AM     Problem: Hypertension, Hyperlipidemia, Diabetes, Coronary Artery Disease, GERD, Anxiety, Tobacco use and History of Renal Transplant   Priority: High     Long-Range Goal: Patient-Specific Goal   Start Date: 05/19/2020  Expected End Date: 12/12/2021  This Visit's Progress: On track  Recent Progress: On track  Priority: High  Note:   Current Barriers:  Unable to independently afford treatment regimen Unable to achieve control of Diabetes   Pharmacist Clinical Goal(s):  Over the next 90 days, patient will verbalize ability to afford treatment regimen achieve control of Diabetes as evidenced by A1c less than 7% through collaboration with PharmD and provider.   Interventions: 1:1 collaboration with Birdie Sons, MD regarding development and update of comprehensive plan of care as evidenced by provider attestation and co-signature Inter-disciplinary care team collaboration (see longitudinal plan of care) Comprehensive medication review performed; medication list updated in electronic medical record  Hypertension (BP goal <130/80) -Controlled -Current treatment: Amlodipine 10 mg daily  Carvedilol 6.25 mg twice daily  Losartan 100 mg daily  -Medications previously tried: NA  -Current home readings: 120-140/70-80  -Denies hypotensive/hypertensive symptoms -Educated on Daily salt intake goal < 2300 mg; Importance of home blood pressure monitoring; -Counseled to monitor BP at home 2-3 times weekly, document, and provide log at future appointments -Recommended to continue current medication  Hyperlipidemia: (LDL goal < 70) -History of PAD, CAD  -Controlled -Current treatment: Rosuvastatin 40 mg daily  -Medications previously tried: NA  -Educated on Importance of limiting foods  high in cholesterol; -Recommended to continue current  medication  Diabetes (A1c goal <7%) -Uncontrolled -Current medications: Novolog 15 units three times daily + 2 units for every 50 units above 150  Tresiba 45 units daily -Medications previously tried: Lantus (Formulary)  -Current home glucose readings  Target 7/12-7/25 8/6-8/19 9/3-9/16  Number of days worn ? 14 days _0 % of time active ? 70% 74% 88% 40%  Mean Glucose (mg/dL)  229 195 233  GMI (2-week A1c estimate) = 3.31 + 0.02392 x [mean glucose in mg/dL]  8.8% 8.0% 8.9%  Glycemic Variability (%CV) ?36% NA 52.7% 48.6%  Time above >250 mg/dL <5% 35% 28% 40%  Time above 70-180 mg/dL <25% 29% 17% 16%  Time in range: 70-180 mg/dL >70% 36% 52% 43%  Time below 70 mg/dL <4% 0% 3% 1%  Time below 54 mg/dL <1% 0% 0%   -Patient reports CGM sensor fell off and was unable to get refill from [facility] or through his DME supplier.  -Patient was started on [name], but has not started the medication due to cost. -Counseled to adjust Novolog dose based on estimated size of carbohydrate intake with meal.  -Recommended to continue current medication -Will start PAP for [name].   Depression/Anxiety (Goal: Maintain stable mood and sleep) -Controlled -Current treatment: Alprazolam 2 mg three times daily as needed - Sleep  -Medications previously tried/failed: NA -PHQ9: 0 -GAD7: 0 -Educated on Benefits of medication for symptom control Benefits of cognitive-behavioral therapy with or without medication -Recommended to continue current medication   Renal Transplant  (Goal: prevent rejection of kidney ) -Managed by Dr. Holley Raring  -Controlled -Current treatment  Mycophenolate 500 mg 2 tablets twice daily  Tacrolimus 1 mg 3 capsules twice daily  -Medications previously tried: Myfortic (cost) -Drug interactions evaluated, no significant drug interactions noted with current regimen.  -Recommended to continue current medication  Tobacco use (Goal Quit Smoking) -Controlled -Previous quit  attempts: Chantix, Nicotine Patch (never started) -Current treatment  None -Patient smokes Within 30 minutes of waking -Patient triggers include: boredom  and finishing a meal -Counseled on identifying alternative stress management tools such as walking, reading or exercising   Allergic Rhinitis (Goal: Minimize symptoms) -Not ideally controlled -Current treatment  None -Medications previously tried: Flonase (ineffective), Zyrtec (ineffective) -Symptoms significantly worse. Patient reports itchy eyes, congestion.  -Recommended claritin 10 mg daily  Chronic Kidney Disease Stage 3a  -All medications assessed for renal dosing and appropriateness in chronic kidney disease. -Recommended to continue current medication  Patient Goals/Self-Care Activities Over the next 90 days, patient will:  - check glucose 2-3 times daily , document, and provide at future appointments -check blood pressure 2-3 times weekly , document, and provide at future appointments -decrease cigarette use   Follow Up Plan: Telephone follow up appointment with care management team member scheduled for:  01/31/2021 at 1:00 PM    Medication Assistance:  Mirian Mo obtained through [name] medication assistance program.  Enrollment ends Dec 2022 [name] PAP in progress. Anticipated start date TBD.   Patient's preferred pharmacy is:  [facility] [address], [place] [address] [place] [address] Phone: [phone] Fax: [phone]  [facility] ([facility] Bundle Only) - [place], [address] [place] [address] Phone: [phone] Fax: [place], [place] [address] [place] [address] Phone: [phone] Fax: [place] Mail  Delivery (Now [facility] Mail [facility]) - [place], [place]  [address] Phone: [phone] Fax: [phone]  [place] [address]), [place] - [place]) [place] [address] Phone: [phone] Fax: [phone]  Uses pill box? Yes Pt endorses 100% compliance  We discussed: Current pharmacy is preferred with insurance plan and patient is satisfied with pharmacy services Patient decided to: Continue current medication management strategy  Care Plan and Follow Up Patient Decision:  Patient agrees to Care Plan and Follow-up.  Plan: Telephone follow up appointment with care management team member scheduled for:  01/31/2021 at 1:00 PM  [place] [phone]

## 2020-12-12 DIAGNOSIS — K219 Gastro-esophageal reflux disease without esophagitis: Secondary | ICD-10-CM | POA: Diagnosis not present

## 2020-12-12 NOTE — Patient Instructions (Signed)
Visit Information It was great speaking with you today!  Please let me know if you have any questions about our visit.   Goals Addressed             This Visit's Progress    Monitor and Manage My Blood Sugar-Diabetes Type 2   On track    Timeframe:  Long-Range Goal Priority:  High Start Date: 05/17/2020                            Expected End Date: 11/16/2021                       Follow Up within 90 days    - check blood sugar at prescribed times - check blood sugar if I feel it is too high or too low - enter blood sugar readings and medication or insulin into daily log    Why is this important?   Checking your blood sugar at home helps to keep it from getting very high or very low.  Writing the results in a diary or log helps the doctor know how to care for you.  Your blood sugar log should have the time, date and the results.  Also, write down the amount of insulin or other medicine that you take.  Other information, like what you ate, exercise done and how you were feeling, will also be helpful.     Notes:         Patient Care Plan: General Pharmacy (Adult)     Problem Identified: Hypertension, Hyperlipidemia, Diabetes, Coronary Artery Disease, GERD, Anxiety, Tobacco use and History of Renal Transplant   Priority: High     Long-Range Goal: Patient-Specific Goal   Start Date: 05/19/2020  Expected End Date: 12/12/2021  This Visit's Progress: On track  Recent Progress: On track  Priority: High  Note:   Current Barriers:  Unable to independently afford treatment regimen Unable to achieve control of Diabetes   Pharmacist Clinical Goal(s):  Over the next 90 days, patient will verbalize ability to afford treatment regimen achieve control of Diabetes as evidenced by A1c less than 7% through collaboration with PharmD and provider.   Interventions: 1:1 collaboration with Birdie Sons, MD regarding development and update of comprehensive plan of care as evidenced by  provider attestation and co-signature Inter-disciplinary care team collaboration (see longitudinal plan of care) Comprehensive medication review performed; medication list updated in electronic medical record  Hypertension (BP goal <130/80) -Controlled -Current treatment: Amlodipine 10 mg daily  Carvedilol 6.25 mg twice daily  Losartan 100 mg daily  -Medications previously tried: NA  -Current home readings: 120-140/70-80  -Denies hypotensive/hypertensive symptoms -Educated on Daily salt intake goal < 2300 mg; Importance of home blood pressure monitoring; -Counseled to monitor BP at home 2-3 times weekly, document, and provide log at future appointments -Recommended to continue current medication  Hyperlipidemia: (LDL goal < 70) -History of PAD, CAD  -Controlled -Current treatment: Rosuvastatin 40 mg daily  -Medications previously tried: NA  -Educated on Importance of limiting foods high in cholesterol; -Recommended to continue current medication  Diabetes (A1c goal <7%) -Uncontrolled -Current medications: Novolog 15 units three times daily + 2 units for every 50 units above 150  Tresiba 45 units daily -Medications previously tried: Lantus (Formulary)  -Current home glucose readings  Target 7/12-7/25 8/6-8/19 9/3-9/16  Number of days worn ? 14 days 14 14 14   % of time active ?  70% 74% 88% 40%  Mean Glucose (mg/dL)  229 195 233  GMI (2-week A1c estimate) = 3.31 + 0.02392 x [mean glucose in mg/dL]  8.8% 8.0% 8.9%  Glycemic Variability (%CV) ?36% NA 52.7% 48.6%  Time above >250 mg/dL <5% 35% 28% 40%  Time above 70-180 mg/dL <25% 29% 17% 16%  Time in range: 70-180 mg/dL >70% 36% 52% 43%  Time below 70 mg/dL <4% 0% 3% 1%  Time below 54 mg/dL <1% 0% 0%   -Patient reports CGM sensor fell off and was unable to get refill from [facility] or through his DME supplier.  -Patient was started on [name], but has not started the medication due to cost. -Counseled to adjust Novolog dose  based on estimated size of carbohydrate intake with meal.  -Recommended to continue current medication -Will start PAP for [name].   Depression/Anxiety (Goal: Maintain stable mood and sleep) -Controlled -Current treatment: Alprazolam 2 mg three times daily as needed - Sleep  -Medications previously tried/failed: NA -PHQ9: 0 -GAD7: 0 -Educated on Benefits of medication for symptom control Benefits of cognitive-behavioral therapy with or without medication -Recommended to continue current medication   Renal Transplant  (Goal: prevent rejection of kidney ) -Managed by Dr. Holley Raring  -Controlled -Current treatment  Mycophenolate 500 mg 2 tablets twice daily  Tacrolimus 1 mg 3 capsules twice daily  -Medications previously tried: Myfortic (cost) -Drug interactions evaluated, no significant drug interactions noted with current regimen.  -Recommended to continue current medication  Tobacco use (Goal Quit Smoking) -Controlled -Previous quit attempts: Chantix, Nicotine Patch (never started) -Current treatment  None -Patient smokes Within 30 minutes of waking -Patient triggers include: boredom  and finishing a meal -Counseled on identifying alternative stress management tools such as walking, reading or exercising   Allergic Rhinitis (Goal: Minimize symptoms) -Not ideally controlled -Current treatment  None -Medications previously tried: Flonase (ineffective), Zyrtec (ineffective) -Symptoms significantly worse. Patient reports itchy eyes, congestion.  -Recommended claritin 10 mg daily  Chronic Kidney Disease Stage 3a  -All medications assessed for renal dosing and appropriateness in chronic kidney disease. -Recommended to continue current medication  Patient Goals/Self-Care Activities Over the next 90 days, patient will:  - check glucose 2-3 times daily , document, and provide at future appointments -check blood pressure 2-3 times weekly , document, and provide at future  appointments -decrease cigarette use   Follow Up Plan: Telephone follow up appointment with care management team member scheduled for:  01/31/2021 at 1:00 PM      Patient agreed to services and verbal consent obtained.   Patient verbalizes understanding of instructions provided today and agrees to view in [place].   [name], PharmD, [place], [place] [phone]

## 2020-12-14 DIAGNOSIS — E785 Hyperlipidemia, unspecified: Secondary | ICD-10-CM | POA: Diagnosis not present

## 2020-12-14 DIAGNOSIS — E669 Obesity, unspecified: Secondary | ICD-10-CM | POA: Diagnosis not present

## 2020-12-14 DIAGNOSIS — N189 Chronic kidney disease, unspecified: Secondary | ICD-10-CM | POA: Diagnosis not present

## 2020-12-14 DIAGNOSIS — E1165 Type 2 diabetes mellitus with hyperglycemia: Secondary | ICD-10-CM | POA: Diagnosis not present

## 2020-12-14 DIAGNOSIS — N2581 Secondary hyperparathyroidism of renal origin: Secondary | ICD-10-CM | POA: Diagnosis not present

## 2020-12-18 DIAGNOSIS — Z794 Long term (current) use of insulin: Secondary | ICD-10-CM | POA: Diagnosis not present

## 2020-12-18 DIAGNOSIS — E1121 Type 2 diabetes mellitus with diabetic nephropathy: Secondary | ICD-10-CM | POA: Diagnosis not present

## 2020-12-23 DIAGNOSIS — E1159 Type 2 diabetes mellitus with other circulatory complications: Secondary | ICD-10-CM

## 2020-12-23 DIAGNOSIS — N1831 Chronic kidney disease, stage 3a: Secondary | ICD-10-CM

## 2020-12-23 DIAGNOSIS — Z794 Long term (current) use of insulin: Secondary | ICD-10-CM | POA: Diagnosis not present

## 2020-12-23 DIAGNOSIS — I152 Hypertension secondary to endocrine disorders: Secondary | ICD-10-CM | POA: Diagnosis not present

## 2020-12-23 DIAGNOSIS — E1140 Type 2 diabetes mellitus with diabetic neuropathy, unspecified: Secondary | ICD-10-CM | POA: Diagnosis not present

## 2020-12-26 DIAGNOSIS — E0859 Diabetes mellitus due to underlying condition with other circulatory complications: Secondary | ICD-10-CM | POA: Diagnosis not present

## 2020-12-26 DIAGNOSIS — I7389 Other specified peripheral vascular diseases: Secondary | ICD-10-CM | POA: Diagnosis not present

## 2020-12-26 DIAGNOSIS — E118 Type 2 diabetes mellitus with unspecified complications: Secondary | ICD-10-CM | POA: Diagnosis not present

## 2020-12-26 DIAGNOSIS — N189 Chronic kidney disease, unspecified: Secondary | ICD-10-CM | POA: Diagnosis not present

## 2020-12-26 DIAGNOSIS — E1121 Type 2 diabetes mellitus with diabetic nephropathy: Secondary | ICD-10-CM | POA: Diagnosis not present

## 2020-12-26 DIAGNOSIS — E669 Obesity, unspecified: Secondary | ICD-10-CM | POA: Diagnosis not present

## 2020-12-26 DIAGNOSIS — E785 Hyperlipidemia, unspecified: Secondary | ICD-10-CM | POA: Diagnosis not present

## 2020-12-26 DIAGNOSIS — I10 Essential (primary) hypertension: Secondary | ICD-10-CM | POA: Diagnosis not present

## 2020-12-26 DIAGNOSIS — Z1211 Encounter for screening for malignant neoplasm of colon: Secondary | ICD-10-CM | POA: Diagnosis not present

## 2020-12-26 DIAGNOSIS — N2581 Secondary hyperparathyroidism of renal origin: Secondary | ICD-10-CM | POA: Diagnosis not present

## 2020-12-28 ENCOUNTER — Telehealth: Payer: Self-pay

## 2020-12-28 NOTE — Progress Notes (Signed)
Chronic Care Management Pharmacy Assistant   Name: James Moreno  MRN: [id number] DOB: 06-Mar-1966  01/06/2021  Reached out to [name] @ [phone], and the representative informed me that the patient has still not been processed in the system at this time. Representative stated that they are working on faxes from 01/04/2021 so either they did not receive it or it still has not been processed. I will at this time apply for the patient via the online website since it has been such a delay so far.    I was able to get the patient enrolled via the [name] website. The patient was approved and enrolled Start of Enrollment 01/06/2021 End of Enrollment is 03/25/2022. I will have to have [name], [name] fax over a prescription to [name] @  [phone] for the patient's [name] 10 mg 1 tablet daily.   [name] is out until Monday, but I sent him a message requesting that he fax over the prescription to [name] upon his return.   Reached out to the patient to provide him an update, but I had to leave a voicemail with information. Patient informed he can call me back with any questions he has   01/04/2021 Received an update for [name], [name] stating that he was able to get PCP to sign patient's application for his [place]. I reached out to [name] @[phone] to check the updated status of the application for this medication. Spoke with the representative and she informed me that they do not have the application in the system at this time that it can take up to 48 hours or more for processing. I will give them a call back Friday to check status.   Called the patient to update him, and I did have to leave a VM with the updated information.  12/28/2020 I received a voicemail from patient asking me to please give him a call back in regards to one of his medications.  I called the patient back, and he informed me that he opened his last box of Tyler Aas so he wanted me to call and check the status of when  they will be mailing out his new order, and he also wanted to let me know he completed the application for his [place] Glade and provided the documentation need so he wanted to check the status of that application. I informed patient that I would reach out to [name] @ [phone]  to see when his next shipment will be sent out and I would also contact [name] @ [phone] to check the status of his application.   I contacted [name] spoke with the representative whom advised that the medication has been shipped and delivered to the providers office on 09/29 and a person named Black signed for this medication. I reached back out to the patient to inform him of the update, and ask if he remembers anyone from the office calling him to come pickup his medication. Patient stated he can't remember and he was heading out of town so he sill go by the office tomorrow.   Prior to reaching out to [name] I spoke with [name], [name] and he informed me that he had not faxed over the patient's application yet as he was missing the providers signature. Once he get's this signature he will fax the application over this week.   Medications: Outpatient Encounter Medications as of 12/28/2020  Medication Sig Note   alprazolam (XANAX) 2 MG tablet TAKE 1 TABLET BY  MOUTH THREE TIMES DAILY AS NEEDED FOR SLEEP    amLODipine (NORVASC) 10 MG tablet Take 10 mg by mouth daily.     B-D ULTRAFINE III SHORT PEN 31G X 8 MM MISC     BD INSULIN SYRINGE U/F 31G X 5/16" 1 ML MISC     Blood Glucose Monitoring Suppl (GLUCOCOM BLOOD GLUCOSE MONITOR) DEVI Frequency:ONCE   Dosage:0.0     Instructions:  Note:Dose: N/A    calcitRIOL (ROCALTROL) 0.25 MCG capsule     carvedilol (COREG) 6.25 MG tablet TAKE 1 TABLET TWICE A DAY WITH MEALS (NEED APPOINTMENT FOR FURTHER REFILLS)    cinacalcet (SENSIPAR) 30 MG tablet Take 30 mg by mouth daily.    EDEX 40 MCG injection 40 mcg by Intracavitary route as needed.     insulin aspart (NOVOLOG) 100  UNIT/ML injection Inject 15 Units into the skin 3 (three) times daily before meals. 5/23/2022Frances [place]wood through [name] Patient Assistance through Dec 2022    insulin degludec (TRESIBA FLEXTOUCH) 100 UNIT/ML FlexTouch Pen Inject 45 Units into the skin daily. 5/23/2022Frances Maywood through [name] Patient Assistance through Dec 2022   Lancets [facility] DELICA PLUS [id number]) MISC     losartan (COZAAR) 100 MG tablet Take 1 tablet (100 mg total) by mouth at bedtime.    mycophenolate (CELLCEPT) 500 MG tablet Take 1,000 mg by mouth 2 (two) times daily.    naloxone (NARCAN) nasal spray 4 mg/0.1 mL Place 1 spray into the nose once.     nicotine (NICODERM CQ) 14 mg/24hr patch Place 1 patch (14 mg total) onto the skin daily.    omeprazole (PRILOSEC) 20 MG capsule Take 2 capsules (40 mg total) by mouth 2 (two) times daily before a meal.    ONETOUCH VERIO test strip     oxyCODONE (OXY IR/ROXICODONE) 5 MG immediate release tablet Take 1-2 tablets (5-10 mg total) by mouth every 6 (six) hours as needed for severe pain.    rosuvastatin (CRESTOR) 40 MG tablet Take 1 tablet (40 mg total) by mouth daily.    sildenafil (VIAGRA) 100 MG tablet TAKE ONE-HALF (1/2) TO ONE TABLET DAILY AS NEEDED FOR ERECTILE DYSFUNCTION    tacrolimus (PROGRAF) 1 MG capsule Take 3 mg by mouth 2 (two) times daily.     tretinoin (RETIN-A) 0.05 % cream Apply topically as needed.     triamcinolone cream (KENALOG) 0.1 % APPLY DAILY TO INFLAMED BUMPS AS NEEDED    XYOSTED 100 MG/0.5ML SOAJ 0.5 mLs once a week. Every sunday    No facility-administered encounter medications on file as of 12/28/2020.    Lynann Bologna, CPA/CMA Clinical Pharmacist Assistant Phone: [phone]

## 2020-12-30 ENCOUNTER — Other Ambulatory Visit: Payer: Self-pay | Admitting: Family Medicine

## 2020-12-30 DIAGNOSIS — L905 Scar conditions and fibrosis of skin: Secondary | ICD-10-CM

## 2020-12-30 DIAGNOSIS — M79604 Pain in right leg: Secondary | ICD-10-CM

## 2020-12-30 LAB — COLOGUARD: Cologuard: NEGATIVE

## 2020-12-30 NOTE — Telephone Encounter (Signed)
Medication Refill - Medication: oxycodone 5 mg  Has the patient contacted their pharmacy no controlled substance must call provider  Preferred Pharmacy (with phone number or street name): [facility]  [address] in [place] phone number [phone] Has the patient been seen for an appointment in the last year OR does the patient have an upcoming appointment? Yes.    Agent: Please be advised that RX refills may take up to 3 business days. We ask that you follow-up with your pharmacy.

## 2020-12-30 NOTE — Telephone Encounter (Signed)
Requested medications are due for refill today yes  Requested medications are on the active medication list yes  Last refill 12/04/20  Last visit 7/22  Future visit scheduled 04/17/21  Notes to clinic This medication can not be delegated, please assess.

## 2021-01-02 ENCOUNTER — Other Ambulatory Visit: Payer: Self-pay | Admitting: Family Medicine

## 2021-01-02 DIAGNOSIS — M79604 Pain in right leg: Secondary | ICD-10-CM

## 2021-01-02 DIAGNOSIS — L905 Scar conditions and fibrosis of skin: Secondary | ICD-10-CM

## 2021-01-02 DIAGNOSIS — R52 Pain, unspecified: Secondary | ICD-10-CM

## 2021-01-02 NOTE — Telephone Encounter (Signed)
Copied from [place] [id number]. Topic: Quick Communication - Rx Refill/Question >> Jan 02, 2021 11:53 AM Tessa Lerner A wrote: Medication: oxyCODONE (OXY IR/ROXICODONE) 5 MG immediate release tablet [[id number]]   Has the patient contacted their pharmacy? Yes. - the patient was directed to contact their PCP. The patient's pharmacy told them that they have not heard from the patient's doctor.  (Agent: If no, request that the patient contact the pharmacy for the refill.) (Agent: If yes, when and what did the pharmacy advise?)  Preferred Pharmacy (with phone number or street name): [place], [place] - [place] [address] Phone: [phone] Fax: [phone]  Has the patient been seen for an appointment in the last year OR does the patient have an upcoming appointment? Yes.    Agent: Please be advised that RX refills may take up to 3 business days. We ask that you follow-up with your pharmacy.

## 2021-01-03 MED ORDER — OXYCODONE HCL 5 MG PO TABS: 5.0000 mg | ORAL_TABLET | Freq: Four times a day (QID) | ORAL | 0 refills | Status: DC | PRN

## 2021-01-03 NOTE — Telephone Encounter (Signed)
Requested medications are on the active medication list yes  Last visit 10/14/20  Future visit scheduled 04/17/21  Notes to clinic Chronic opiod use, no urine drug screen, this med is not delegated, please assess.

## 2021-01-17 DIAGNOSIS — E1121 Type 2 diabetes mellitus with diabetic nephropathy: Secondary | ICD-10-CM | POA: Diagnosis not present

## 2021-01-17 DIAGNOSIS — Z794 Long term (current) use of insulin: Secondary | ICD-10-CM | POA: Diagnosis not present

## 2021-01-18 ENCOUNTER — Telehealth: Payer: Self-pay

## 2021-01-18 NOTE — Telephone Encounter (Signed)
-----   Message from Lin Landsman, MD sent at 01/18/2021  4:13 PM EDT ----- Regarding: Gastric emptying study I recommend gastric emptying study for epigastric pain  Please call patient and ask him if he is agreeable, then go ahead and order  He also needs a follow-up appointment to see me  Thanks RV

## 2021-01-18 NOTE — Telephone Encounter (Signed)
Called patient no answer left voicemail to call us back was gonna ask patient if he was ok with a gastric emptying study for the gastric pain if he is then I will get it scheduled

## 2021-01-19 ENCOUNTER — Telehealth: Payer: Self-pay

## 2021-01-19 ENCOUNTER — Other Ambulatory Visit: Payer: Self-pay

## 2021-01-19 DIAGNOSIS — K219 Gastro-esophageal reflux disease without esophagitis: Secondary | ICD-10-CM

## 2021-01-19 NOTE — Telephone Encounter (Signed)
Called patient he stated he had had a study done where he drank some fluid an dthey studied it going in his stomach he said if the gastric emptying was different to call him back and he will look at his schedule and see

## 2021-01-19 NOTE — Progress Notes (Signed)
Yes patient wants the gastric emptying study done but wants it in January sometime so called central scheduling they cant schedule into January yet called patient and let him know so we wil call him back in December sometime to get it scheduled for him

## 2021-01-19 NOTE — Telephone Encounter (Signed)
-----   Message from Lin Landsman, MD sent at 01/18/2021  4:13 PM EDT ----- Regarding: Gastric emptying study I recommend gastric emptying study for epigastric pain  Please call patient and ask him if he is agreeable, then go ahead and order  He also needs a follow-up appointment to see me  Thanks RV

## 2021-01-27 ENCOUNTER — Other Ambulatory Visit: Payer: Self-pay | Admitting: Family Medicine

## 2021-01-27 DIAGNOSIS — M79604 Pain in right leg: Secondary | ICD-10-CM

## 2021-01-27 DIAGNOSIS — L905 Scar conditions and fibrosis of skin: Secondary | ICD-10-CM

## 2021-01-27 NOTE — Telephone Encounter (Signed)
Requested medications are due for refill today filled for #240 01/03/21  Requested medications are on the active medication list yes  Last refill 01/03/21  Last visit 10/14/20  Future visit scheduled 03/2021  Notes to clinic This medication can not be delegated, please assess.  Requested Prescriptions  Pending Prescriptions Disp Refills   oxyCODONE (OXY IR/ROXICODONE) 5 MG immediate release tablet 240 tablet 0    Sig: Take 1-2 tablets (5-10 mg total) by mouth every 6 (six) hours as needed for severe pain.     Not Delegated - Analgesics:  Opioid Agonists Failed - 01/27/2021  4:04 PM      Failed - This refill cannot be delegated      Failed - Urine Drug Screen completed in last 360 days      Passed - Valid encounter within last 6 months    Recent Outpatient Visits           3 months ago Type 2 diabetes mellitus with diabetic nephropathy, without long-term current use of insulin ([place])   [facility] Birdie Sons, MD   7 months ago Type 2 diabetes mellitus with diabetic nephropathy, without long-term current use of insulin ([place])   [facility] Birdie Sons, MD   11 months ago Type 2 diabetes mellitus with diabetic nephropathy, with long-term current use of insulin ([place])   [facility] Birdie Sons, MD   1 year ago Type 2 diabetes mellitus with diabetic nephropathy, without long-term current use of insulin [facility])   [facility] Birdie Sons, MD   2 years ago Mixed hyperlipidemia   Ollie, Kirstie Peri, MD       Future Appointments             In 2 months Fisher, Kirstie Peri, MD [facility], [place]

## 2021-01-27 NOTE — Telephone Encounter (Signed)
Medication Refill - Medication:oxyCODONE (OXY IR/ROXICODONE) 5 MG immediate release tablet   Has the patient contacted their pharmacy? yes (Agent: If no, request that the patient contact the pharmacy for the refill. If patient does not wish to contact the pharmacy document the reason why and proceed with request.) (Agent: If yes, when and what did the pharmacy advise?)contact pcp  Preferred Pharmacy (with phone number or street name): [address]  [place], [place]  Phone:  [phone] Fax:  [phone] Has the patient been seen for an appointment in the last year OR does the patient have an upcoming appointment? yes  Agent: Please be advised that RX refills may take up to 3 business days. We ask that you follow-up with your pharmacy.

## 2021-01-29 MED ORDER — OXYCODONE HCL 5 MG PO TABS: 5.0000 mg | ORAL_TABLET | Freq: Four times a day (QID) | ORAL | 0 refills | Status: DC | PRN

## 2021-01-30 ENCOUNTER — Telehealth: Payer: Self-pay

## 2021-01-30 NOTE — Progress Notes (Signed)
    Chronic Care Management Pharmacy Assistant   Name: James Moreno  MRN: [id number] DOB: 1965/05/30  Patient called to be reminded of his telephone appointment with [name], [name] on 01/31/2021 @ 1300.  No answer, left message of appointment date, time and type of appointment (either telephone or in person). Left message to have all medications, supplements, blood pressure and/or blood sugar logs available during appointment and to return call if need to reschedule.  Questions:  If patient has any PAP medications ask if they are having any problems getting their PAP medication or refill?   Renewal application for [place] and Novolog started and e-mailed to [name]. The patient did not answer the phone so I will have [name] ask the patient during their telephone appointment tomorrow how he would like to receive the application in the mail or pick up at [facility].   The patient's Wilder Glade is good until 03/25/2022. The application was recently submitted and approved for this medication so nothing at this time has to be done as the patient does have a year prescription on file at this time.   Star Rating Drug: Rosuvastatin 40 mg last filled on 09/16/2020 for a 90-Day supply with [place] Losartan 100 mg last filled on 11/15/2020 for a 90-Day supply no pharmacy noted [name] 10 mg patient currently receives this medication through [name] enrollment good until 03/25/2022  Any gaps in medications fill history? Yes  Care Gaps: HIV Screening Zoster Vaccines PNA Vaccine Hemoglobin A1C (last completed 05/25/2020) Tetanus/TDAP Diabetic Foot Exam (Patient had Podiatry appt on 11/30/2020) BP> 140/90   Lynann Bologna, CPA/CMA Clinical Pharmacist Assistant Phone: [phone]

## 2021-01-31 ENCOUNTER — Ambulatory Visit (INDEPENDENT_AMBULATORY_CARE_PROVIDER_SITE_OTHER): Payer: Medicare HMO

## 2021-01-31 DIAGNOSIS — E1140 Type 2 diabetes mellitus with diabetic neuropathy, unspecified: Secondary | ICD-10-CM

## 2021-01-31 DIAGNOSIS — I152 Hypertension secondary to endocrine disorders: Secondary | ICD-10-CM

## 2021-01-31 DIAGNOSIS — Z794 Long term (current) use of insulin: Secondary | ICD-10-CM

## 2021-01-31 MED ORDER — DAPAGLIFLOZIN PROPANEDIOL 10 MG PO TABS: 10.0000 mg | ORAL_TABLET | Freq: Every day | ORAL | 3 refills | Status: DC

## 2021-01-31 NOTE — Progress Notes (Signed)
Chronic Care Management Pharmacy Note  02/02/2021 Name:  James Moreno MRN:  [id number] DOB:  Jun 10, 1965  Summary: Patient presents for CCM follow-up. He has been on vacation and has not been as strict on his diet. [name] PAP was approved, but he has not received the medication yet.   Recommendations/Changes made from today's visit: INCREASE Tresiba to 50 units daily in on week.  -Start PAP renewals for James Moreno, and [name]  Plan: [name] follow-up 3 months  Subjective: James Moreno is an 55 y.o. year old male who is a primary patient of Fisher, Kirstie Peri, MD.  The CCM team was consulted for assistance with disease management and care coordination needs.    Engaged with patient by telephone for follow up visit in response to provider referral for pharmacy case management and/or care coordination services.   Consent to Services:  The patient was given information about Chronic Care Management services, agreed to services, and gave verbal consent prior to initiation of services.  Please see initial visit note for detailed documentation.   Patient Care Team: Birdie Sons, MD as PCP - General (Family Medicine) Rockey Situ Kathlene November, MD as PCP - Cardiology (Cardiology) Ronnald Collum, Lourdes Sledge, MD as Attending Physician (Endocrinology) [place], [place] (Optometry) Anthonette Legato, MD (Nephrology) Long, Thomes Cake, MD as Referring Physician (Vascular Surgery) Isaias Sakai, MD as Referring Physician (Ophthalmology) Lin Landsman, MD as Consulting Physician (Gastroenterology) Wellington Hampshire, MD as Consulting Physician (Cardiology) Germaine Pomfret, [facility] (Pharmacist) Garrel Ridgel, DPM as Consulting Physician (Podiatry)  Recent office visits: 06/20/20: Patient presented to Dr. Caryn Section for follow-up. Chantix stopped, patient started on Nicotine Patch 21 mg daily.  02/12/20: Video visit with Dr. Caryn Section for follow-up. Patient given Lantus 30 units daily due to  cost .   Recent consult visits: 11/01/20: Patient presented to Dr. Holley Raring (Nephrology). [name] 10 mg daily added. 10/21/20: Patient presented to Dr. Rockey Situ (Cardiology) for follow-up.  07/21/20: Patient presented to Dr. Holley Raring (Nephrology) for follow-up.  06/01/20: Patient presented to Dr. Ronnald Collum (Endocrinology) for initial visit.  05/12/20: Patient presented to Laurann Montana, NP for follow-up. Claudication stable.   Hospital visits: None in previous 6 months  Objective:  Lab Results  Component Value Date   CREATININE 1.72 (H) 06/20/2020   BUN 29 (H) 06/20/2020   GFRNONAA 39 (L) 09/17/2019   GFRAA 45 (L) 09/17/2019   NA 139 06/20/2020   K 4.2 06/20/2020   CALCIUM 10.7 (H) 06/20/2020   CO2 21 06/20/2020    Lab Results  Component Value Date/Time   HGBA1C 8.8 05/25/2020 12:00 AM   HGBA1C 9.7 (H) 09/17/2019 02:17 PM   HGBA1C 6.7 12/12/2018 12:00 AM   MICROALBUR 100 12/31/2016 02:13 PM    Last diabetic Eye exam:  Lab Results  Component Value Date/Time   HMDIABEYEEXA Retinopathy (A) 02/15/2020 12:00 AM    Last diabetic Foot exam: No results found for: HMDIABFOOTEX   Lab Results  Component Value Date   CHOL 141 06/20/2020   HDL 46 06/20/2020   LDLCALC 53 06/20/2020   TRIG 268 (H) 06/20/2020   CHOLHDL 3.1 06/20/2020    Hepatic Function Latest Ref Rng & Units 06/20/2020 09/17/2019 12/12/2018  Total Protein 6.0 - 8.5 g/dL - 6.5 -  Albumin 3.8 - 4.9 g/dL 4.2 4.3 -  AST 0 - 40 IU/L - 9 14  ALT 0 - 44 IU/L - 9 16  Alk Phosphatase 48 - 121 IU/L - 131(H) 95  Total  Bilirubin 0.0 - 1.2 mg/dL - 0.6 -    Lab Results  Component Value Date/Time   TSH 1.34 12/12/2018 12:00 AM   TSH 1.72 06/13/2006 12:00 AM    CBC Latest Ref Rng & Units 12/12/2018 10/21/2015 06/21/2013  WBC - 6.7 8.9 9.7  Hemoglobin 13.5 - 17.5 14.8 16.3 10.1(L)  Hematocrit 41 - 53 43 46.7 28.9(L)  Platelets 150 - 399 182 140(L) 175    No results found for: VD25OH  Clinical ASCVD: Yes  The 10-year ASCVD risk  score (Arnett DK, et al., 2019) is: 19.3%   Values used to calculate the score:     Age: 55 years     Sex: Male     Is Non-Hispanic African American: No     Diabetic: Yes     Tobacco smoker: Yes     Systolic Blood Pressure: 626 mmHg     Is BP treated: Yes     HDL Cholesterol: 46 mg/dL     Total Cholesterol: 141 mg/dL    Depression screen [facility] 2/9 10/14/2020 01/19/2020 01/06/2019  Decreased Interest 0 0 0  Down, Depressed, Hopeless 0 0 0  PHQ - 2 Score 0 0 0  Altered sleeping 0 - -  Tired, decreased energy 0 - -  Change in appetite 0 - -  Feeling bad or failure about yourself  0 - -  Trouble concentrating 0 - -  Moving slowly or fidgety/restless 0 - -  Suicidal thoughts 0 - -  PHQ-9 Score 0 - -  Difficult doing work/chores Not difficult at all - -  Some recent data might be hidden      Social History   Tobacco Use  Smoking Status Every Day   Packs/day: 0.75   Years: 38.00   Pack years: 28.50   Types: Cigarettes  Smokeless Tobacco Never  Tobacco Comments   since age 16.   BP Readings from Last 3 Encounters:  10/21/20 140/60  10/14/20 (!) 164/76  06/20/20 124/60   Pulse Readings from Last 3 Encounters:  10/21/20 89  10/14/20 86  06/20/20 91   Wt Readings from Last 3 Encounters:  10/21/20 224 lb 2 oz (101.7 kg)  10/14/20 226 lb 12.8 oz (102.9 kg)  06/20/20 222 lb 9.6 oz (101 kg)    Assessment/Interventions: Review of patient past medical history, allergies, medications, health status, including review of consultants reports, laboratory and other test data, was performed as part of comprehensive evaluation and provision of chronic care management services.   SDOH:  (Social Determinants of Health) assessments and interventions performed: Yes   CCM Care Plan  Allergies  Allergen Reactions   No Known Allergies     Medications Reviewed Today     Reviewed by Rip Harbour, [facility] (Certified Podiatric Assistant) on 11/30/20 at 1348  Med List Status: <None>    Medication Order Taking? Sig Documenting Provider Last Dose Status Informant  alprazolam (XANAX) 2 MG tablet [id number] No TAKE 1 TABLET BY MOUTH THREE TIMES DAILY AS NEEDED FOR SLEEP Fisher, Kirstie Peri, MD Taking Active   amLODipine (NORVASC) 10 MG tablet [id number] No Take 10 mg by mouth daily.  [provider] Taking Active   B-D ULTRAFINE III SHORT PEN 31G X 8 MM MISC [id number] No  [provider] Taking Active   BD INSULIN SYRINGE U/F 31G X 5/16" 1 ML MISC [id number] No  [provider] Taking Active   Blood Glucose Monitoring Suppl [facility] BLOOD GLUCOSE MONITOR) DEVI [id number] No  Frequency:ONCE   Dosage:0.0     Instructions:  Note:Dose: N/A [provider] Taking Active Self           Med Note Kenton Kingfisher, Germaine Pomfret Oct 30, 2017 11:50 AM)    calcitRIOL (ROCALTROL) 0.25 MCG capsule [id number] No  [provider] Taking Active   carvedilol (COREG) 6.25 MG tablet [id number] No TAKE 1 TABLET TWICE A DAY WITH MEALS (NEED APPOINTMENT FOR FURTHER REFILLS) Minna Merritts, MD Taking Active            Med Note Tyrone Sage May 12, 2020 11:06 AM)    cinacalcet (SENSIPAR) 30 MG tablet [id number] No Take 30 mg by mouth daily. [provider] Taking Active Self  EDEX 40 MCG injection [id number] No 40 mcg by Intracavitary route as needed.  [provider] Taking Active   insulin aspart (NOVOLOG) 100 UNIT/ML injection [id number] No Inject 15 Units into the skin 3 (three) times daily before meals. [provider] Taking Active Self           Med Note Raeford Razor Aug 15, 2020  3:50 PM) Frances Maywood through [name] Patient Assistance through Dec 2022   insulin degludec [facility]) 100 UNIT/ML FlexTouch Pen [id number] No Inject 30 Units into the skin daily. [provider] Taking Active            Med Note Raeford Razor Aug 15, 2020  3:50 PM) Frances Maywood through [name] Patient  Assistance through Dec 2022  Lancets [facility] Donaciano Eva PLUS [id number]) [place] [id number] No  [provider] Taking Active   losartan (COZAAR) 100 MG tablet [id number] No Take 1 tablet (100 mg total) by mouth at bedtime. Rise Mu, PA-C Taking Active   mycophenolate (CELLCEPT) 500 MG tablet [id number] No Take 1,000 mg by mouth 2 (two) times daily. [provider] Taking Active   naloxone [facility] nasal spray 4 mg/0.1 mL [id number] No Place 1 spray into the nose once.  [provider] Taking Active   nicotine (NICODERM CQ) 14 mg/24hr patch [id number] No Place 1 patch (14 mg total) onto the skin daily. Birdie Sons, MD Taking Active   omeprazole (PRILOSEC) 20 MG capsule [id number] No Take 2 capsules (40 mg total) by mouth 2 (two) times daily before a meal. Lin Landsman, MD Taking Expired 10/21/20 2359   ONETOUCH VERIO test strip [id number] No  [provider] Taking Active   oxyCODONE (OXY IR/ROXICODONE) 5 MG immediate release tablet [id number] No Take 1-2 tablets (5-10 mg total) by mouth every 6 (six) hours as needed for severe pain. Birdie Sons, MD Taking Active   rosuvastatin (CRESTOR) 40 MG tablet [id number] No Take 1 tablet (40 mg total) by mouth daily. Minna Merritts, MD Taking Active   sildenafil (VIAGRA) 100 MG tablet [id number] No TAKE ONE-HALF (1/2) TO ONE TABLET DAILY AS NEEDED FOR ERECTILE DYSFUNCTION Birdie Sons, MD Taking Active   tacrolimus (PROGRAF) 1 MG capsule [id number] No Take 3 mg by mouth 2 (two) times daily.  [provider] Taking Active Self           Med Note Kenton Kingfisher, Germaine Pomfret Oct 30, 2017 11:51 AM)    tretinoin (RETIN-A) 0.05 % cream [id number] No Apply topically as needed.  [provider] Taking Active   triamcinolone cream (KENALOG) 0.1 % [id number] No APPLY DAILY TO INFLAMED BUMPS AS NEEDED  [provider] Taking Active   XYOSTED 100 MG/0.5ML SOAJ [id number] No 0.5 mLs once a week. Every sunday  [provider] Taking Active             Patient Active Problem List   Diagnosis Date Noted   Chronic pain following surgery or procedure 11/15/2020   Proliferative retinopathy of left eye due to diabetes mellitus ([place]) 01/28/2019   Esophageal dysphagia    Benign essential hypertension 12/11/2018   Secondary hyperparathyroidism of renal origin ([place]) 12/11/2018   Severe tobacco use disorder 08/01/2018   Atherosclerosis of artery of extremity with ulceration ([place]) 07/23/2018   Gastroesophageal reflux disease 05/26/2018   PAD (peripheral artery disease) ([place]) 10/30/2017   Chronic ulcer of heel, right, with unspecified severity ([place]) 10/16/2017   Hepatitis B core antibody positive 07/03/2017   Hypertriglyceridemia 07/03/2017   Chronic, continuous use of opioids 03/28/2016   Anxiety 03/28/2016   Pain in surgical scar 11/07/2015   Bulging eyes 01/24/2015   Leg mass 01/24/2015   Renal transplant, status post 01/24/2015   Carotid arterial disease ([place]) 12/27/2014   Compulsive tobacco user syndrome 12/27/2014   Abnormal EKG 04/06/2013   Erectile dysfunction 11/29/2012   Obesity 11/28/2012   Type 2 diabetes mellitus with diabetic nephropathy ([place]) 11/28/2012   Hypertension 12/21/2011   Hyperlipidemia 12/21/2011   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 10/30/2011   Obstructive apnea 01/09/2011   History of other malignant neoplasm of skin 07/16/2006   Glaucoma 01/13/2006   Episodic paroxysmal anxiety disorder 03/26/1998    Immunization History  Administered Date(s) Administered   Influenza Split 12/05/2010   Influenza,inj,Quad PF,6+ Mos 12/31/2016, 01/27/2019, 01/02/2021   PFIZER(Purple Top)SARS-COV-2 Vaccination 06/18/2019, 07/09/2019, 02/22/2020, 01/31/2021   Pneumococcal Polysaccharide-23 10/24/2010   Tdap 12/05/2010    Conditions to be addressed/monitored:  Hypertension, Hyperlipidemia, Diabetes, Coronary Artery Disease, GERD, Anxiety, Tobacco use and History  of Renal Transplant   Care Plan : General Pharmacy (Adult)  Updates made by Germaine Pomfret, RPH since 02/02/2021 12:00 AM     Problem: Hypertension, Hyperlipidemia, Diabetes, Coronary Artery Disease, GERD, Anxiety, Tobacco use and History of Renal Transplant   Priority: High     Long-Range Goal: Patient-Specific Goal   Start Date: 05/19/2020  Expected End Date: 12/12/2021  This Visit's Progress: On track  Recent Progress: On track  Priority: High  Note:   Current Barriers:  Unable to independently afford treatment regimen Unable to achieve control of Diabetes   Pharmacist Clinical Goal(s):  Over the next 90 days, patient will verbalize ability to afford treatment regimen achieve control of Diabetes as evidenced by A1c less than 7% through collaboration with PharmD and provider.   Interventions: 1:1 collaboration with Birdie Sons, MD regarding development and update of comprehensive plan of care as evidenced by provider attestation and co-signature Inter-disciplinary care team collaboration (see longitudinal plan of care) Comprehensive medication review performed; medication list updated in electronic medical record  Hypertension (BP goal <130/80) -Controlled -Current treatment: Amlodipine 10 mg daily  Carvedilol 6.25 mg twice daily  Losartan 100 mg daily  -Medications previously tried: NA  -Current home readings: 120-140/70-80  -Denies hypotensive/hypertensive symptoms -Educated on Daily salt intake goal < 2300 mg; Importance of home blood pressure monitoring; -Counseled to monitor BP at home 2-3 times weekly, document, and provide log at future appointments -Recommended to continue current medication  Hyperlipidemia: (LDL goal < 70) -History of PAD, CAD  -Controlled -Current treatment: Rosuvastatin 40 mg daily  -Medications previously tried: NA  -Educated  on Importance of limiting foods high in cholesterol; -Recommended to continue current  medication  Diabetes (A1c goal <8%) -Uncontrolled -Current medications: [name] 10 mg daily (not started yet) Novolog 15 units three times daily + 2 units for every 50 units above 150  Tresiba 45 units daily (0.44 u/kg) -Medications previously tried: Lantus (Formulary)  -Current home glucose readings  Target 7/12-7/25 8/6-8/19 9/3-9/16 10/26-11/8  Number of days worn ? 14 days _0 % of time active ? 70% 74% 88% 40% 80%  Mean Glucose (mg/dL)  229 195 233 262  GMI (2-week A1c estimate) = 3.31 + 0.02392 x [mean glucose in mg/dL]  8.8% 8.0% 8.9% 9.6%  Glycemic Variability (%CV) ?36% NA 52.7% 48.6% 38.8%  Time above >250 mg/dL <5% 35% 28% 40% 49%  Time above 70-180 mg/dL <25% 29% 17% 16% 26%  Time in range: 70-180 mg/dL >70% 36% 52% 43% 25%  Time below 70 mg/dL <4% 0% 3% 1% 0%  Time below 54 mg/dL <1% 0% 0% 0% 0%  -Dietary Patterns: Patient has been on vacation and indulging in high carbohydrate diet.  -Exericse Patterns: Plans to restart weekly  -Recommended to continue current medication  Depression/Anxiety (Goal: Maintain stable mood and sleep) -Controlled -Current treatment: Alprazolam 2 mg three times daily as needed - Sleep  -Medications previously tried/failed: NA -PHQ9: 0 -GAD7: 0 -Educated on Benefits of medication for symptom control Benefits of cognitive-behavioral therapy with or without medication -Recommended to continue current medication   Renal Transplant  (Goal: prevent rejection of kidney ) -Managed by Dr. Holley Raring  -Controlled -Current treatment  Mycophenolate 500 mg 2 tablets twice daily  Tacrolimus 1 mg 3 capsules twice daily  -Medications previously tried: Myfortic (cost) -Drug interactions evaluated, no significant drug interactions noted with current regimen.  -Recommended to continue current medication  Allergic Rhinitis (Goal: Minimize symptoms) -Not ideally controlled -Current treatment  None -Medications previously tried: Flonase  (ineffective), Zyrtec (ineffective) -Symptoms significantly worse. Patient reports itchy eyes, congestion.  -Recommended claritin 10 mg daily  Chronic Kidney Disease Stage 3a  -All medications assessed for renal dosing and appropriateness in chronic kidney disease. -Recommended to continue current medication  Patient Goals/Self-Care Activities Over the next 90 days, patient will:  - check glucose 2-3 times daily , document, and provide at future appointments -check blood pressure 2-3 times weekly , document, and provide at future appointments -decrease cigarette use   Follow Up Plan: Telephone follow up appointment with care management team member scheduled for:  05/05/2021 at 1:00 PM    Medication Assistance:  James Moreno obtained through [name] medication assistance program.  Enrollment ends Dec 2022 [name] PAP in progress. Anticipated start date TBD.   Patient's preferred pharmacy is:  [facility] [address] [place] [address] [place] [address] Phone: [phone] Fax: [phone]  [facility] (ESRD Bundle Only) - [place], [address] [place] [address] Phone: [phone] Fax: [place], [place] [address] [place] [address] Phone: [phone] Fax: [phone]  [place], [place] [address] Phone: [phone] Fax: [phone]  [place] [address] [place]) [place] [address] Phone: [phone] Fax: [place], [place]  [place] [address] Phone: [phone] Fax: [phone]  Uses pill box? Yes Pt endorses 100% compliance  We discussed: Current pharmacy is preferred with insurance plan  and patient is satisfied with pharmacy services Patient decided to: Continue current medication management strategy  Care Plan and Follow Up Patient Decision:  Patient agrees to Care Plan and Follow-up.  Plan: Telephone follow up appointment with care management team member scheduled for:  05/05/2021 at 1:00 PM  [place] [phone]

## 2021-02-01 ENCOUNTER — Telehealth: Payer: Self-pay

## 2021-02-01 NOTE — Progress Notes (Signed)
Chronic Care Management Pharmacy Assistant   Name: LARSEN DUNGAN  MRN: [id number] DOB: [birth year]  Patient Assistance Application [name] - ID [id number]  I received a task from [name], [name] requesting that I reach out to [name] to check the status of the patient's [name] shipment with [name].   Patient was approved on 12/28/2020 for [name], but still has not received the medication. I reached out to [name] @ [phone] and spoke with Alda Berthold and she advised that the patient's Wilder Glade was shipped on 01/18/2021. Alda Berthold advised that the medication was not shipped to the patient's home address it was shipped to [address] [place],  [address]. Alda Berthold advised that this address was another address listed on the patient's application.  I tried calling the patient to speak with him to inform him about the delivery of his medication, but I had to leave a voice mail requesting a return call.  02/02/2021  I spoke with the patient, and updated him on the delivery address for his [place] Glade, and he stated this is his Kidney doctor so he will contact the provider to see if they have the medication. I told him if they do not have it to give me a call back so I can contact [name] in order to have them reship this medication back out to him. Patient also requested I contact [name] to have the shipping address changed to his home address.   Patient also wanted me to make sure that [name] changed his household size to one on his renewal application He also wanted me to inform [name] that he had his Flu Vaccine given to him on 12/26/2020 by Dr. [name] and it was the Quadrivalent Flue Vaccine.  Message sent to [name] with all information provided by patient.   02/03/2021  I spoke with a representative, and advised her that the address that the patient's Wilder Glade was sent to was the patient's Nephrologist Office, but the patient would like the medication to be shipped to his home. The  representative was able to updated the information so the next shipment will be sent to the patient's home address per request of the patient.     Medications: Outpatient Encounter Medications as of 02/01/2021  Medication Sig Note   alprazolam (XANAX) 2 MG tablet TAKE 1 TABLET BY MOUTH THREE TIMES DAILY AS NEEDED FOR SLEEP    amLODipine (NORVASC) 10 MG tablet Take 10 mg by mouth daily.     B-D ULTRAFINE III SHORT PEN 31G X 8 MM MISC     BD INSULIN SYRINGE U/F 31G X 5/16" 1 ML MISC     Blood Glucose Monitoring Suppl (GLUCOCOM BLOOD GLUCOSE MONITOR) DEVI Frequency:ONCE   Dosage:0.0     Instructions:  Note:Dose: N/A    calcitRIOL (ROCALTROL) 0.25 MCG capsule     carvedilol (COREG) 6.25 MG tablet TAKE 1 TABLET TWICE A DAY WITH MEALS (NEED APPOINTMENT FOR FURTHER REFILLS)    cinacalcet (SENSIPAR) 30 MG tablet Take 30 mg by mouth daily.    dapagliflozin propanediol ([name]) 10 MG TABS tablet Take 1 tablet (10 mg total) by mouth daily before breakfast. Patient receives through [name] through Dec 2023    EDEX 40 MCG injection 40 mcg by Intracavitary route as needed.     insulin aspart (NOVOLOG) 100 UNIT/ML injection Inject 15 Units into the skin 3 (three) times daily before meals. 5/23/2022Frances Maywood through [name] Patient Assistance through Dec 2022    insulin degludec [facility]) 100  UNIT/ML FlexTouch Pen Inject 45 Units into the skin daily. 5/23/2022Frances Maywood through [name] Patient Assistance through Dec 2022   Lancets [facility] DELICA PLUS [id number]) MISC     losartan (COZAAR) 100 MG tablet Take 1 tablet (100 mg total) by mouth at bedtime.    mycophenolate (CELLCEPT) 500 MG tablet Take 1,000 mg by mouth 2 (two) times daily.    naloxone (NARCAN) nasal spray 4 mg/0.1 mL Place 1 spray into the nose once.     nicotine (NICODERM CQ) 14 mg/24hr patch Place 1 patch (14 mg total) onto the skin daily.    omeprazole (PRILOSEC) 20 MG capsule Take 2 capsules (40 mg total) by mouth 2 (two)  times daily before a meal.    ONETOUCH VERIO test strip     oxyCODONE (OXY IR/ROXICODONE) 5 MG immediate release tablet Take 1-2 tablets (5-10 mg total) by mouth every 6 (six) hours as needed for severe pain.    rosuvastatin (CRESTOR) 40 MG tablet Take 1 tablet (40 mg total) by mouth daily.    sildenafil (VIAGRA) 100 MG tablet TAKE ONE-HALF (1/2) TO ONE TABLET DAILY AS NEEDED FOR ERECTILE DYSFUNCTION    tacrolimus (PROGRAF) 1 MG capsule Take 3 mg by mouth 2 (two) times daily.     tretinoin (RETIN-A) 0.05 % cream Apply topically as needed.     triamcinolone cream (KENALOG) 0.1 % APPLY DAILY TO INFLAMED BUMPS AS NEEDED    XYOSTED 100 MG/0.5ML SOAJ 0.5 mLs once a week. Every sunday    No facility-administered encounter medications on file as of 02/01/2021.   Lynann Bologna, CPA/CMA Clinical Pharmacist Assistant Phone: [phone]

## 2021-02-02 NOTE — Patient Instructions (Signed)
Visit Information It was great speaking with you today!  Please let me know if you have any questions about our visit.   Goals Addressed   None     Patient Care Plan: General Pharmacy (Adult)     Problem Identified: Hypertension, Hyperlipidemia, Diabetes, Coronary Artery Disease, GERD, Anxiety, Tobacco use and History of Renal Transplant   Priority: High     Long-Range Goal: Patient-Specific Goal   Start Date: 05/19/2020  Expected End Date: 12/12/2021  This Visit's Progress: On track  Recent Progress: On track  Priority: High  Note:   Current Barriers:  Unable to independently afford treatment regimen Unable to achieve control of Diabetes   Pharmacist Clinical Goal(s):  Over the next 90 days, patient will verbalize ability to afford treatment regimen achieve control of Diabetes as evidenced by A1c less than 7% through collaboration with PharmD and provider.   Interventions: 1:1 collaboration with Birdie Sons, MD regarding development and update of comprehensive plan of care as evidenced by provider attestation and co-signature Inter-disciplinary care team collaboration (see longitudinal plan of care) Comprehensive medication review performed; medication list updated in electronic medical record  Hypertension (BP goal <130/80) -Controlled -Current treatment: Amlodipine 10 mg daily  Carvedilol 6.25 mg twice daily  Losartan 100 mg daily  -Medications previously tried: NA  -Current home readings: 120-140/70-80  -Denies hypotensive/hypertensive symptoms -Educated on Daily salt intake goal < 2300 mg; Importance of home blood pressure monitoring; -Counseled to monitor BP at home 2-3 times weekly, document, and provide log at future appointments -Recommended to continue current medication  Hyperlipidemia: (LDL goal < 70) -History of PAD, CAD  -Controlled -Current treatment: Rosuvastatin 40 mg daily  -Medications previously tried: NA  -Educated on Importance of  limiting foods high in cholesterol; -Recommended to continue current medication  Diabetes (A1c goal <8%) -Uncontrolled -Current medications: [name] 10 mg daily (not started yet) Novolog 15 units three times daily + 2 units for every 50 units above 150  Tresiba 45 units daily (0.44 u/kg) -Medications previously tried: Lantus (Formulary)  -Current home glucose readings  Target 7/12-7/25 8/6-8/19 9/3-9/16 10/26-11/8  Number of days worn ? 14 days 14 14 14 14   % of time active ? 70% 74% 88% 40% 80%  Mean Glucose (mg/dL)  229 195 233 262  GMI (2-week A1c estimate) = 3.31 + 0.02392 x [mean glucose in mg/dL]  8.8% 8.0% 8.9% 9.6%  Glycemic Variability (%CV) ?36% NA 52.7% 48.6% 38.8%  Time above >250 mg/dL <5% 35% 28% 40% 49%  Time above 70-180 mg/dL <25% 29% 17% 16% 26%  Time in range: 70-180 mg/dL >70% 36% 52% 43% 25%  Time below 70 mg/dL <4% 0% 3% 1% 0%  Time below 54 mg/dL <1% 0% 0% 0% 0%  -Dietary Patterns: Patient has been on vacation and indulging in high carbohydrate diet.  -Exericse Patterns: Plans to restart weekly  -Recommended to continue current medication  Depression/Anxiety (Goal: Maintain stable mood and sleep) -Controlled -Current treatment: Alprazolam 2 mg three times daily as needed - Sleep  -Medications previously tried/failed: NA -PHQ9: 0 -GAD7: 0 -Educated on Benefits of medication for symptom control Benefits of cognitive-behavioral therapy with or without medication -Recommended to continue current medication   Renal Transplant  (Goal: prevent rejection of kidney ) -Managed by Dr. Holley Raring  -Controlled -Current treatment  Mycophenolate 500 mg 2 tablets twice daily  Tacrolimus 1 mg 3 capsules twice daily  -Medications previously tried: Myfortic (cost) -Drug interactions evaluated, no significant drug interactions noted  with current regimen.  -Recommended to continue current medication  Allergic Rhinitis (Goal: Minimize symptoms) -Not ideally  controlled -Current treatment  None -Medications previously tried: Flonase (ineffective), Zyrtec (ineffective) -Symptoms significantly worse. Patient reports itchy eyes, congestion.  -Recommended claritin 10 mg daily  Chronic Kidney Disease Stage 3a  -All medications assessed for renal dosing and appropriateness in chronic kidney disease. -Recommended to continue current medication  Patient Goals/Self-Care Activities Over the next 90 days, patient will:  - check glucose 2-3 times daily , document, and provide at future appointments -check blood pressure 2-3 times weekly , document, and provide at future appointments -decrease cigarette use   Follow Up Plan: Telephone follow up appointment with care management team member scheduled for:  05/05/2021 at 1:00 PM      Patient agreed to services and verbal consent obtained.   Patient verbalizes understanding of instructions provided today and agrees to view in [place].   [name], PharmD, [place], [name]  Clinical Pharmacist Practitioner  [facility] [phone]

## 2021-02-16 DIAGNOSIS — Z794 Long term (current) use of insulin: Secondary | ICD-10-CM | POA: Diagnosis not present

## 2021-02-16 DIAGNOSIS — E1121 Type 2 diabetes mellitus with diabetic nephropathy: Secondary | ICD-10-CM | POA: Diagnosis not present

## 2021-02-22 DIAGNOSIS — I152 Hypertension secondary to endocrine disorders: Secondary | ICD-10-CM | POA: Diagnosis not present

## 2021-02-22 DIAGNOSIS — E1159 Type 2 diabetes mellitus with other circulatory complications: Secondary | ICD-10-CM

## 2021-02-22 DIAGNOSIS — E1122 Type 2 diabetes mellitus with diabetic chronic kidney disease: Secondary | ICD-10-CM | POA: Diagnosis not present

## 2021-02-22 DIAGNOSIS — Z794 Long term (current) use of insulin: Secondary | ICD-10-CM

## 2021-02-22 DIAGNOSIS — N1831 Chronic kidney disease, stage 3a: Secondary | ICD-10-CM

## 2021-02-22 DIAGNOSIS — E1140 Type 2 diabetes mellitus with diabetic neuropathy, unspecified: Secondary | ICD-10-CM

## 2021-02-22 DIAGNOSIS — F17210 Nicotine dependence, cigarettes, uncomplicated: Secondary | ICD-10-CM

## 2021-02-23 ENCOUNTER — Other Ambulatory Visit: Payer: Self-pay | Admitting: Family Medicine

## 2021-02-23 DIAGNOSIS — M79604 Pain in right leg: Secondary | ICD-10-CM

## 2021-02-23 DIAGNOSIS — L905 Scar conditions and fibrosis of skin: Secondary | ICD-10-CM

## 2021-02-23 DIAGNOSIS — R52 Pain, unspecified: Secondary | ICD-10-CM

## 2021-02-23 NOTE — Telephone Encounter (Signed)
Copied from [place] [phone]. Topic: Quick Communication - Rx Refill/Question >> Feb 23, 2021  4:35 PM Tessa Lerner A wrote: Medication: oxyCODONE (OXY IR/ROXICODONE) 5 MG immediate release tablet [[id number]]   Has the patient contacted their pharmacy? No.   (Agent: If no, request that the patient contact the pharmacy for the refill. If patient does not wish to contact the pharmacy document the reason why and proceed with request.) (Agent: If yes, when and what did the pharmacy advise?)  Preferred Pharmacy (with phone number or street name): [place], [place]  Phone:  [phone] Fax:  [phone]  Has the patient been seen for an appointment in the last year OR does the patient have an upcoming appointment? Yes.    Agent: Please be advised that RX refills may take up to 3 business days. We ask that you follow-up with your pharmacy.

## 2021-02-25 NOTE — Telephone Encounter (Signed)
Requested medication (s) are due for refill today: yes  Requested medication (s) are on the active medication list: yes  Future visit scheduled: 04/17/21  Notes to clinic:  This medication can not be delegated, please assess.     Requested Prescriptions  Pending Prescriptions Disp Refills   oxyCODONE (OXY IR/ROXICODONE) 5 MG immediate release tablet 240 tablet 0    Sig: Take 1-2 tablets (5-10 mg total) by mouth every 6 (six) hours as needed for severe pain.     Not Delegated - Analgesics:  Opioid Agonists Failed - 02/23/2021  9:25 PM      Failed - This refill cannot be delegated      Failed - Urine Drug Screen completed in last 360 days      Passed - Valid encounter within last 6 months    Recent Outpatient Visits           4 months ago Type 2 diabetes mellitus with diabetic nephropathy, without long-term current use of insulin ([place])   [facility] Birdie Sons, MD   8 months ago Type 2 diabetes mellitus with diabetic nephropathy, without long-term current use of insulin ([place])   [facility] Birdie Sons, MD   1 year ago Type 2 diabetes mellitus with diabetic nephropathy, with long-term current use of insulin ([place])   [facility] Birdie Sons, MD   1 year ago Type 2 diabetes mellitus with diabetic nephropathy, without long-term current use of insulin [facility])   [facility] Birdie Sons, MD   2 years ago Mixed hyperlipidemia   [place], Kirstie Peri, MD       Future Appointments             In 1 month Fisher, Kirstie Peri, MD [facility], [place]

## 2021-02-27 ENCOUNTER — Ambulatory Visit (INDEPENDENT_AMBULATORY_CARE_PROVIDER_SITE_OTHER): Payer: Medicare HMO

## 2021-02-27 DIAGNOSIS — Z0000 Encounter for general adult medical examination without abnormal findings: Secondary | ICD-10-CM

## 2021-02-27 NOTE — Patient Instructions (Signed)
[name] , Thank you for taking time to come for your Medicare Wellness Visit. I appreciate your ongoing commitment to your health goals. Please review the following plan we discussed and let me know if I can assist you in the future.   Screening recommendations/referrals: Colonoscopy: cologuard this year, 2022 Recommended yearly ophthalmology/optometry visit for glaucoma screening and checkup Recommended yearly dental visit for hygiene and checkup  Vaccinations: Influenza vaccine: 04/04/20 Pneumococcal vaccine: 09/26/10 Tdap vaccine: 12/05/10, due Shingles vaccine: n/d   Covid-19: 06/18/19, 07/09/19, 02/22/20, 01/31/21   Advanced directives: no, requested in info, will mail  Conditions/risks identified:   Next appointment: Follow up in one year for your annual wellness visit - next year  Preventive Care 40-64 Years, Male Preventive care refers to lifestyle choices and visits with your health care provider that can promote health and wellness. What does preventive care include? A yearly physical exam. This is also called an annual well check. Dental exams once or twice a year. Routine eye exams. Ask your health care provider how often you should have your eyes checked. Personal lifestyle choices, including: Daily care of your teeth and gums. Regular physical activity. Eating a healthy diet. Avoiding tobacco and drug use. Limiting alcohol use. Practicing safe sex. Taking low-dose aspirin every day starting at age 72. What happens during an annual well check? The services and screenings done by your health care provider during your annual well check will depend on your age, overall health, lifestyle risk factors, and family history of disease. Counseling  Your health care provider may ask you questions about your: Alcohol use. Tobacco use. Drug use. Emotional well-being. Home and relationship well-being. Sexual activity. Eating habits. Work and work Statistician. Screening   You may have the following tests or measurements: Height, weight, and BMI. Blood pressure. Lipid and cholesterol levels. These may be checked every 5 years, or more frequently if you are over 42 years old. Skin check. Lung cancer screening. You may have this screening every year starting at age 36 if you have a 30-pack-year history of smoking and currently smoke or have quit within the past 15 years. Fecal occult blood test (FOBT) of the stool. You may have this test every year starting at age 19. Flexible sigmoidoscopy or colonoscopy. You may have a sigmoidoscopy every 5 years or a colonoscopy every 10 years starting at age 81. Prostate cancer screening. Recommendations will vary depending on your family history and other risks. Hepatitis C blood test. Hepatitis B blood test. Sexually transmitted disease (STD) testing. Diabetes screening. This is done by checking your blood sugar (glucose) after you have not eaten for a while (fasting). You may have this done every 1-3 years. Discuss your test results, treatment options, and if necessary, the need for more tests with your health care provider. Vaccines  Your health care provider may recommend certain vaccines, such as: Influenza vaccine. This is recommended every year. Tetanus, diphtheria, and acellular pertussis (Tdap, Td) vaccine. You may need a Td booster every 10 years. Zoster vaccine. You may need this after age 51. Pneumococcal 13-valent conjugate (PCV13) vaccine. You may need this if you have certain conditions and have not been vaccinated. Pneumococcal polysaccharide (PPSV23) vaccine. You may need one or two doses if you smoke cigarettes or if you have certain conditions. Talk to your health care provider about which screenings and vaccines you need and how often you need them. This information is not intended to replace advice given to you by your health care  provider. Make sure you discuss any questions you have with your health  care provider. Document Released: 04/08/2015 Document Revised: 11/30/2015 Document Reviewed: 01/11/2015 Elsevier Interactive Patient Education  2017 [employer] Prevention in the Home Falls can cause injuries. They can happen to people of all ages. There are many things you can do to make your home safe and to help prevent falls. What can I do on the outside of my home? Regularly fix the edges of walkways and driveways and fix any cracks. Remove anything that might make you trip as you walk through a door, such as a raised step or threshold. Trim any bushes or trees on the path to your home. Use bright outdoor lighting. Clear any walking paths of anything that might make someone trip, such as rocks or tools. Regularly check to see if handrails are loose or broken. Make sure that both sides of any steps have handrails. Any raised decks and porches should have guardrails on the edges. Have any leaves, snow, or ice cleared regularly. Use sand or salt on walking paths during winter. Clean up any spills in your garage right away. This includes oil or grease spills. What can I do in the bathroom? Use night lights. Install grab bars by the toilet and in the tub and shower. Do not use towel bars as grab bars. Use non-skid mats or decals in the tub or shower. If you need to sit down in the shower, use a plastic, non-slip stool. Keep the floor dry. Clean up any water that spills on the floor as soon as it happens. Remove soap buildup in the tub or shower regularly. Attach bath mats securely with double-sided non-slip rug tape. Do not have throw rugs and other things on the floor that can make you trip. What can I do in the bedroom? Use night lights. Make sure that you have a light by your bed that is easy to reach. Do not use any sheets or blankets that are too big for your bed. They should not hang down onto the floor. Have a firm chair that has side arms. You can use this for support  while you get dressed. Do not have throw rugs and other things on the floor that can make you trip. What can I do in the kitchen? Clean up any spills right away. Avoid walking on wet floors. Keep items that you use a lot in easy-to-reach places. If you need to reach something above you, use a strong step stool that has a grab bar. Keep electrical cords out of the way. Do not use floor polish or wax that makes floors slippery. If you must use wax, use non-skid floor wax. Do not have throw rugs and other things on the floor that can make you trip. What can I do with my stairs? Do not leave any items on the stairs. Make sure that there are handrails on both sides of the stairs and use them. Fix handrails that are broken or loose. Make sure that handrails are as long as the stairways. Check any carpeting to make sure that it is firmly attached to the stairs. Fix any carpet that is loose or worn. Avoid having throw rugs at the top or bottom of the stairs. If you do have throw rugs, attach them to the floor with carpet tape. Make sure that you have a light switch at the top of the stairs and the bottom of the stairs. If you do not have  them, ask someone to add them for you. What else can I do to help prevent falls? Wear shoes that: Do not have high heels. Have rubber bottoms. Are comfortable and fit you well. Are closed at the toe. Do not wear sandals. If you use a stepladder: Make sure that it is fully opened. Do not climb a closed stepladder. Make sure that both sides of the stepladder are locked into place. Ask someone to hold it for you, if possible. Clearly mark and make sure that you can see: Any grab bars or handrails. First and last steps. Where the edge of each step is. Use tools that help you move around (mobility aids) if they are needed. These include: Canes. Walkers. Scooters. Crutches. Turn on the lights when you go into a dark area. Replace any light bulbs as soon as they  burn out. Set up your furniture so you have a clear path. Avoid moving your furniture around. If any of your floors are uneven, fix them. If there are any pets around you, be aware of where they are. Review your medicines with your doctor. Some medicines can make you feel dizzy. This can increase your chance of falling. Ask your doctor what other things that you can do to help prevent falls. This information is not intended to replace advice given to you by your health care provider. Make sure you discuss any questions you have with your health care provider. Document Released: 01/06/2009 Document Revised: 08/18/2015 Document Reviewed: 04/16/2014 Elsevier Interactive Patient Education  2017 [employer].

## 2021-02-27 NOTE — Progress Notes (Signed)
Virtual Visit via Telephone Note  I connected with  James Moreno on 02/27/21 at 11:00 AM EST by telephone and verified that I am speaking with the correct person using two identifiers.  Location: Patient: home Provider: [facility] Persons participating in the virtual visit: [place]   I discussed the limitations, risks, security and privacy concerns of performing an evaluation and management service by telephone and the availability of in person appointments. The patient expressed understanding and agreed to proceed.  Interactive audio and video telecommunications were attempted between this nurse and patient, however failed, due to patient having technical difficulties OR patient did not have access to video capability.  We continued and completed visit with audio only.  Some vital signs may be absent or patient reported.   Dionisio David, LPN   Subjective:   James Moreno is a 55 y.o. male who presents for Medicare Annual/Subsequent preventive examination.  Review of Systems     Cardiac Risk Factors include: advanced age (>9men, >15 women);male gender     Objective:    There were no vitals filed for this visit. There is no height or weight on file to calculate BMI.  Advanced Directives 02/27/2021 01/19/2020 01/21/2019 01/06/2019 01/02/2018 10/21/2015 02/09/2015  Does Patient Have a Medical Advance Directive? No No No No Yes Yes No  Type of Advance Directive - - - - Press photographer;Living will - -  Does patient want to make changes to medical advance directive? - - - Yes (ED - Information included in AVS) - - -  Copy of [place] in Chart? - - - - No - copy requested - -  Would patient like information on creating a medical advance directive? Yes (ED - Information included in AVS) No - Patient declined Yes (MAU/Ambulatory/Procedural Areas - Information given) - - - -    Current Medications (verified) Outpatient Encounter  Medications as of 02/27/2021  Medication Sig   alprazolam (XANAX) 2 MG tablet TAKE 1 TABLET BY MOUTH THREE TIMES DAILY AS NEEDED FOR SLEEP   amLODipine (NORVASC) 10 MG tablet Take 10 mg by mouth daily.    amLODipine (NORVASC) 10 MG tablet Take 1 tablet by mouth daily.   B-D ULTRAFINE III SHORT PEN 31G X 8 MM MISC    BD INSULIN SYRINGE U/F 31G X 5/16" 1 ML MISC    Blood Glucose Monitoring Suppl (GLUCOCOM BLOOD GLUCOSE MONITOR) DEVI Frequency:ONCE   Dosage:0.0     Instructions:  Note:Dose: N/A   calcitRIOL (ROCALTROL) 0.25 MCG capsule    carvedilol (COREG) 6.25 MG tablet TAKE 1 TABLET TWICE A DAY WITH MEALS (NEED APPOINTMENT FOR FURTHER REFILLS)   cinacalcet (SENSIPAR) 30 MG tablet Take 30 mg by mouth daily.   dapagliflozin propanediol ([name]) 10 MG TABS tablet Take 1 tablet (10 mg total) by mouth daily before breakfast. Patient receives through [name] through Dec 2023   EDEX 40 MCG injection 40 mcg by Intracavitary route as needed.    fenofibrate (TRICOR) 145 MG tablet    insulin aspart (NOVOLOG) 100 UNIT/ML injection Inject 15 Units into the skin 3 (three) times daily before meals.   insulin degludec (TRESIBA FLEXTOUCH) 100 UNIT/ML FlexTouch Pen Inject 45 Units into the skin daily.   Lancets (ONETOUCH DELICA PLUS [id number]) MISC    LANTUS SOLOSTAR 100 UNIT/ML Solostar Pen    losartan (COZAAR) 100 MG tablet Take 1 tablet (100 mg total) by mouth at bedtime.   mycophenolate (CELLCEPT) 500 MG tablet  Take 1,000 mg by mouth 2 (two) times daily.   naloxone (NARCAN) nasal spray 4 mg/0.1 mL Place 1 spray into the nose once.    nicotine (NICODERM CQ) 14 mg/24hr patch Place 1 patch (14 mg total) onto the skin daily.   NOVOLOG 100 UNIT/ML injection    ONETOUCH VERIO test strip    oxyCODONE (OXY IR/ROXICODONE) 5 MG immediate release tablet Take 1-2 tablets (5-10 mg total) by mouth every 6 (six) hours as needed for severe pain.   rosuvastatin (CRESTOR) 40 MG tablet Take 1 tablet (40 mg total) by mouth  daily.   sildenafil (VIAGRA) 100 MG tablet TAKE ONE-HALF (1/2) TO ONE TABLET DAILY AS NEEDED FOR ERECTILE DYSFUNCTION   tacrolimus (PROGRAF) 1 MG capsule Take 3 mg by mouth 2 (two) times daily.    tretinoin (RETIN-A) 0.05 % cream Apply topically as needed.    triamcinolone cream (KENALOG) 0.1 % APPLY DAILY TO INFLAMED BUMPS AS NEEDED   XYOSTED 100 MG/0.5ML SOAJ 0.5 mLs once a week. Every sunday   omeprazole (PRILOSEC) 20 MG capsule Take 2 capsules (40 mg total) by mouth 2 (two) times daily before a meal.   No facility-administered encounter medications on file as of 02/27/2021.    Allergies (verified) No known allergies   History: Past Medical History:  Diagnosis Date   Acute kidney failure, unspecified ([place])    Anxiety    Diabetes mellitus, type 2 (HCC)    GERD (gastroesophageal reflux disease)    History of arterial disease of lower extremity    History of hepatitis B    Hypertension    Hypothyroidism    Mitral valve disorders(424.0)    Pityriasis 12/27/2014   Primary pulmonary HTN ([place])    Pt denies ever having.   Tricuspid valve disorders, specified as nonrheumatic    Past Surgical History:  Procedure Laterality Date   APPENDECTOMY     Carotid Doppler Ultrasound  06/14/2009   39% stenosis of bilateral internal carotid artery, bilateral anterograde vertebral flow   ESOPHAGOGASTRODUODENOSCOPY (EGD) WITH PROPOFOL N/A 01/21/2019   Procedure: ESOPHAGOGASTRODUODENOSCOPY (EGD) WITH PROPOFOL;  Surgeon: Lin Landsman, MD;  Location: [facility];  Service: Endoscopy;  Laterality: N/A;  Diabetic - insulin   EYE SURGERY     right   HEMORROIDECTOMY     KIDNEY TRANSPLANT Right 2016   MECKEL DIVERTICULUM EXCISION     infancy   Myocardial Perfusion scan  01/17/2009   [facility], non- ischemic. LVEF= 55%   PARS PLANA VITRECTOMY Left 02/09/2015   Procedure: Pan retinal photocoagulation 14481;  Surgeon: Milus Height, MD;  Location: [facility];  Service:  Ophthalmology;  Laterality: Left;   REFRACTIVE SURGERY Left    sleep study  01/09/2011   Severe sleep apnea. AHI 72.9/hr. RDI=83.0/hr. Desaturation to 69.0% Emergency CPAP titaration to 14.0cm (01/14/19 resolved after wt loss after kidney transplant.)   Family History  Problem Relation Age of Onset   Hypertension Mother    Hyperlipidemia Mother    Melanoma Father    Social History   Socioeconomic History   Marital status: Divorced    Spouse name: Not on file   Number of children: 1   Years of education: Not on file   Highest education level: Associate degree: occupational, Hotel manager, or vocational program  Occupational History   Occupation: Insurance account manager    Comment: on disability  Tobacco Use   Smoking status: Every Day    Packs/day: 0.75    Years: 38.00  Pack years: 28.50    Types: Cigarettes   Smokeless tobacco: Never   Tobacco comments:    since age 34.  Vaping Use   Vaping Use: Former  Substance and Sexual Activity   Alcohol use: Yes    Alcohol/week: 0.0 - 1.0 standard drinks    Comment: Excessive alcohol consumption in the past. Quit around 2016. Drinks occasionally.   Drug use: No   Sexual activity: Not on file  Other Topics Concern   Not on file  Social History Narrative   Not on file   Social Determinants of Health   Financial Resource Strain: Low Risk    Difficulty of Paying Living Expenses: Not hard at all  Food Insecurity: No Food Insecurity   Worried About Running Out of Food in the Last Year: Never true   [place] in the Last Year: Never true  Transportation Needs: No Transportation Needs   Lack of Transportation (Medical): No   Lack of Transportation (Non-Medical): No  Physical Activity: Insufficiently Active   Days of Exercise per Week: 3 days   Minutes of Exercise per Session: 30 min  Stress: No Stress Concern Present   Feeling of Stress : Not at all  Social Connections: Socially Isolated   Frequency of Communication with Friends  and Family: More than three times a week   Frequency of Social Gatherings with Friends and Family: More than three times a week   Attends Religious Services: Never   Marine scientist or Organizations: No   Attends Archivist Meetings: Never   Marital Status: Divorced    Tobacco Counseling Ready to quit: Not Answered Counseling given: Not Answered Tobacco comments: since age 28.   Clinical Intake:  Pre-visit preparation completed: Yes  Pain : No/denies pain     Nutritional Risks: None Diabetes: Yes CBG done?: No Did pt. bring in CBG monitor from home?: No  How often do you need to have someone help you when you read instructions, pamphlets, or other written materials from your doctor or pharmacy?: 1 - Never  Diabetic?yes Nutrition Risk Assessment:  Has the patient had any N/V/D within the last 2 months?  No  Does the patient have any non-healing wounds?  No  Has the patient had any unintentional weight loss or weight gain?  No   Diabetes:  Is the patient diabetic?  Yes  If diabetic, was a CBG obtained today?  No  Did the patient bring in their glucometer from home?  No  How often do you monitor your CBG's? 3 times/day.   Financial Strains and Diabetes Management:  Are you having any financial strains with the device, your supplies or your medication? No .  Does the patient want to be seen by Chronic Care Management for management of their diabetes?  No  Would the patient like to be referred to a Nutritionist or for Diabetic Management?  No   Diabetic Exams:  Diabetic Eye Exam: Completed 02/15/20. Overdue for diabetic eye exam. Pt has been advised about the importance in completing this exam.   Diabetic Foot Exam: Completed 11/14/17. Pt has been advised about the importance in completing this exam.  Interpreter Needed?: No  Information entered by :: Kirke Shaggy, LPN   Activities of Daily Living In your present state of health, do you have any  difficulty performing the following activities: 02/27/2021 10/14/2020  Hearing? N N  Vision? N N  Difficulty concentrating or making decisions? N N  Walking  or climbing stairs? N Y  Dressing or bathing? N N  Doing errands, shopping? N N  Preparing Food and eating ? N -  Using the Toilet? N -  In the past six months, have you accidently leaked urine? N -  Do you have problems with loss of bowel control? N -  Managing your Medications? N -  Managing your Finances? N -  Housekeeping or managing your Housekeeping? N -  Some recent data might be hidden    Patient Care Team: Birdie Sons, MD as PCP - General (Family Medicine) Rockey Situ, Kathlene November, MD as PCP - Cardiology (Cardiology) Ronnald Collum, Lourdes Sledge, MD as Attending Physician (Endocrinology) Pa, [place] (Optometry) Anthonette Legato, MD (Nephrology) Long, Thomes Cake, MD as Referring Physician (Vascular Surgery) Isaias Sakai, MD as Referring Physician (Ophthalmology) Lin Landsman, MD as Consulting Physician (Gastroenterology) Wellington Hampshire, MD as Consulting Physician (Cardiology) Germaine Pomfret, [facility] (Pharmacist) Garrel Ridgel, DPM as Consulting Physician (Podiatry)  Indicate any recent Medical Services you may have received from other than [facility] providers in the past year (date may be approximate).     Assessment:   This is a routine wellness examination for Val.  Hearing/Vision screen No results found.  Dietary issues and exercise activities discussed: Current Exercise Habits: Home exercise routine, Type of exercise: walking, Time (Minutes): 30, Frequency (Times/Week): 3, Weekly Exercise (Minutes/Week): 90, Intensity: Mild, Exercise limited by: None identified   Goals Addressed             This Visit's Progress    DIET - EAT MORE FRUITS AND VEGETABLES       See A1C down 6.9       Depression Screen PHQ 2/9 Scores 02/27/2021 10/14/2020 01/19/2020 01/06/2019 01/02/2018 12/31/2016  PHQ - 2  Score 0 0 0 0 0 0  PHQ- 9 Score - 0 - - - 0    Fall Risk Fall Risk  02/27/2021 10/14/2020 01/19/2020 01/19/2019 01/06/2019  Falls in the past year? 0 0 0 0 0  Number falls in past yr: 0 0 0 0 0  Injury with Fall? 0 0 0 0 0  Risk for fall due to : No Fall Risks No Fall Risks - - -  Follow up Falls prevention discussed - - - -    FALL RISK PREVENTION PERTAINING TO THE HOME:  Any stairs in or around the home? Yes  If so, are there any without handrails? No  Home free of loose throw rugs in walkways, pet beds, electrical cords, etc? Yes  Adequate lighting in your home to reduce risk of falls? Yes   ASSISTIVE DEVICES UTILIZED TO PREVENT FALLS:  Life alert? No  Use of a cane, walker or w/c? No  Grab bars in the bathroom? Yes  Shower chair or bench in shower? No  Elevated toilet seat or a handicapped toilet? No   TIMED UP AND GO:  Was the test performed? No .    Cognitive Function:Normal cognitive status assessed by direct observation by this Nurse Health Advisor. No abnormalities found.       6CIT Screen 01/06/2019  What Year? 0 points  What month? 0 points  What time? 0 points  Count back from 20 0 points  Months in reverse 0 points  Repeat phrase 0 points  Total Score 0    Immunizations Immunization History  Administered Date(s) Administered   Influenza Split 12/05/2010   Influenza,inj,Quad PF,6+ Mos 12/31/2016, 01/27/2019, 01/02/2021   PFIZER(Purple  Top)SARS-COV-2 Vaccination 06/18/2019, 07/09/2019, 02/22/2020, 01/31/2021   Pneumococcal Polysaccharide-23 10/24/2010   Tdap 12/05/2010    TDAP status: Due, Education has been provided regarding the importance of this vaccine. Advised may receive this vaccine at local pharmacy or Health Dept. Aware to provide a copy of the vaccination record if obtained from local pharmacy or Health Dept. Verbalized acceptance and understanding.  Flu Vaccine status: Up to date  Pneumococcal vaccine status: Up to date  Covid-19  vaccine status: Completed vaccines  Qualifies for Shingles Vaccine? Yes   Zostavax completed No   Shingrix Completed?: No.    Education has been provided regarding the importance of this vaccine. Patient has been advised to call insurance company to determine out of pocket expense if they have not yet received this vaccine. Advised may also receive vaccine at local pharmacy or Health Dept. Verbalized acceptance and understanding.  Screening Tests Health Maintenance  Topic Date Due   HIV Screening  Never done   Zoster Vaccines- Shingrix (1 of 2) Never done   Pneumococcal Vaccine 60-85 Years old (2 - PCV) 10/24/2011   FOOT EXAM  11/15/2018   HEMOGLOBIN A1C  11/25/2020   TETANUS/TDAP  12/04/2020   OPHTHALMOLOGY EXAM  02/14/2021   COVID-19 Vaccine (5 - Booster for Pfizer series) 03/28/2021   Fecal DNA (Cologuard)  12/27/2023   INFLUENZA VACCINE  Completed   HPV VACCINES  Aged Out    Health Maintenance  Health Maintenance Due  Topic Date Due   HIV Screening  Never done   Zoster Vaccines- Shingrix (1 of 2) Never done   Pneumococcal Vaccine 78-82 Years old (2 - PCV) 10/24/2011   FOOT EXAM  11/15/2018   HEMOGLOBIN A1C  11/25/2020   TETANUS/TDAP  12/04/2020   OPHTHALMOLOGY EXAM  02/14/2021    Colorectal cancer screening: Type of screening: Cologuard. Completed this year 2022. Repeat every 3 years  Lung Cancer Screening: (Low Dose CT Chest recommended if Age 27-80 years, 30 pack-year currently smoking OR have quit w/in 15years.) does not qualify.    Additional Screening:  Hepatitis C Screening: does qualify; Completed no  Vision Screening: Recommended annual ophthalmology exams for early detection of glaucoma and other disorders of the eye. Is the patient up to date with their annual eye exam?  No  Who is the provider or what is the name of the office in which the patient attends annual eye exams? [facility] If pt is not established with a provider, would they like to be  referred to a provider to establish care? No .   Dental Screening: Recommended annual dental exams for proper oral hygiene  Community Resource Referral / Chronic Care Management: CRR required this visit?  No   CCM required this visit?  No      Plan:     I have personally reviewed and noted the following in the patient's chart:   Medical and social history Use of alcohol, tobacco or illicit drugs  Current medications and supplements including opioid prescriptions. Patient is currently taking opioid prescriptions. Information provided to patient regarding non-opioid alternatives. Patient advised to discuss non-opioid treatment plan with their provider. Functional ability and status Nutritional status Physical activity Advanced directives List of other physicians Hospitalizations, surgeries, and ER visits in previous 12 months Vitals Screenings to include cognitive, depression, and falls Referrals and appointments  In addition, I have reviewed and discussed with patient certain preventive protocols, quality metrics, and best practice recommendations. A written personalized care plan for preventive services  as well as general preventive health recommendations were provided to patient.     Dionisio David, LPN   49/09/261   Nurse Notes: none- phone call visit

## 2021-02-28 DIAGNOSIS — I152 Hypertension secondary to endocrine disorders: Secondary | ICD-10-CM | POA: Diagnosis not present

## 2021-02-28 DIAGNOSIS — Z794 Long term (current) use of insulin: Secondary | ICD-10-CM | POA: Diagnosis not present

## 2021-02-28 DIAGNOSIS — E785 Hyperlipidemia, unspecified: Secondary | ICD-10-CM | POA: Diagnosis not present

## 2021-02-28 DIAGNOSIS — E1129 Type 2 diabetes mellitus with other diabetic kidney complication: Secondary | ICD-10-CM | POA: Diagnosis not present

## 2021-02-28 DIAGNOSIS — E1151 Type 2 diabetes mellitus with diabetic peripheral angiopathy without gangrene: Secondary | ICD-10-CM | POA: Diagnosis not present

## 2021-02-28 DIAGNOSIS — E1169 Type 2 diabetes mellitus with other specified complication: Secondary | ICD-10-CM | POA: Diagnosis not present

## 2021-02-28 DIAGNOSIS — E1159 Type 2 diabetes mellitus with other circulatory complications: Secondary | ICD-10-CM | POA: Diagnosis not present

## 2021-02-28 LAB — HEMOGLOBIN A1C: Hemoglobin A1C: 8.1

## 2021-02-28 MED ORDER — OXYCODONE HCL 5 MG PO TABS: 5.0000 mg | ORAL_TABLET | Freq: Four times a day (QID) | ORAL | 0 refills | Status: DC | PRN

## 2021-03-01 ENCOUNTER — Other Ambulatory Visit: Payer: Self-pay

## 2021-03-01 ENCOUNTER — Ambulatory Visit: Payer: Medicare HMO | Admitting: Podiatry

## 2021-03-01 ENCOUNTER — Encounter: Payer: Self-pay | Admitting: Podiatry

## 2021-03-01 DIAGNOSIS — Z794 Long term (current) use of insulin: Secondary | ICD-10-CM

## 2021-03-01 DIAGNOSIS — I739 Peripheral vascular disease, unspecified: Secondary | ICD-10-CM

## 2021-03-01 DIAGNOSIS — E0859 Diabetes mellitus due to underlying condition with other circulatory complications: Secondary | ICD-10-CM | POA: Diagnosis not present

## 2021-03-01 DIAGNOSIS — B351 Tinea unguium: Secondary | ICD-10-CM | POA: Diagnosis not present

## 2021-03-01 DIAGNOSIS — M79676 Pain in unspecified toe(s): Secondary | ICD-10-CM

## 2021-03-01 NOTE — Progress Notes (Signed)
He presents today chief complaint of painful elongated toenails.  States that he is diabetic and like to have his feet checked as well.  Objective: There is no erythema edema cellulitis drainage or odor pulses are barely palpable DP right minimally so on the left capillary fill time is bilateral and immediate.  There are some digital hair growth noted.  His nails are long thick yellow dystrophic.  Assessment: Pain in limb secondary to nail dystrophy.  Plan: Debridement of toenails bilateral.

## 2021-03-02 ENCOUNTER — Other Ambulatory Visit: Payer: Self-pay

## 2021-03-02 ENCOUNTER — Other Ambulatory Visit: Payer: Self-pay | Admitting: Gastroenterology

## 2021-03-02 MED ORDER — OMEPRAZOLE 20 MG PO CPDR: 40.0000 mg | DELAYED_RELEASE_CAPSULE | Freq: Two times a day (BID) | ORAL | 1 refills | Status: DC

## 2021-03-02 NOTE — Progress Notes (Signed)
Sent in refill as requested 

## 2021-03-06 ENCOUNTER — Telehealth: Payer: Self-pay

## 2021-03-06 NOTE — Progress Notes (Signed)
Chronic Care Management Pharmacy Assistant   Name: James Moreno  MRN: [id number] DOB: [birth year]  Patient Assistant 1540 Renewal Application Status  The patient is currently receiving his [place] and Novolog medication through a patient assistant program with [name]. The patient's renewal application was faxed to them on 02/10/2021 by [name], [name]. The patient's Endocrinologist recently started him on Ozempic in which he dropped off patient assistant paperwork to [name], [name], and it was faxed over to [name] on Friday.  I reached out toto [employer] to check the status of the patient's T[place]iba, and EchoStar application. I also wanted to see if they received the paperwork for his Ozempic as well.    I spoke with Rudie Meyer a representative with [name], and she confirmed that they did receive the patient's [id number] application. The representative also confirmed that the application for 6195 which included his Tyler Aas and Cira Servant has been approved and shipment would start in January. The representative was unable to find the application for the patient's Ozempic that was faxed on Friday. I will check back later in the week to follow-up to make sure they did get the paperwork for his Ozempic.  03/07/2021  Patient called as he wanted an update regarding his Ozempic application he turned in a few days ago. I informed him that [facility] faxed it to [employer], but when I spoke with [employer] on yesterday they did not see the fax in their system. I advised the patient that I was going to give it a few days and contact them back on Friday and I would give him a call with the update at that time. I also informed patient that he has been approved for the 2023 year with [employer] for his [place] and Novolog.   Patient advised that he received his first shipment of [place] today, and he has taken his first pill today. He advised that he also had blood work done today prior to taking his first pill.  Patient has no other concerns or issues at this time.  03/10/2021  I spoke with the representative regarding the prescription for Ozempic that was faxed over on 03/03/2021 for the patient. The representative advised that they have not received any faxes for this patient since 02/12/2021.  I sent my PTM the e-mail to have her re-fax over the prescription to [employer] @ [phone] for the patient since the [name] is on PTO. PTM confirmed when she is back in office she will fax prescription over.   03/14/2021  I spoke with the representative at [name], and she stated that she does not see where there has been a new prescription for Ozempic faxed in for this patient. The representative also advised that the prescription should not be faxed in until January or it will be denied.   I sent a task to the [name] so he will not forget to refax the prescription over to [name] in January for the patient's Ozempic.   I spoke with the patient patient, and updated him on his application. The patient also stated that his [place] sensor feel off, and that when he contacted [facility] regarding this they advised him they were no longer in network, and he would need to contact [facility] for his replacement. Patient advised he did contact [employer], but now he needs me to figure out what DME company is in network for his Elenor Legato supplies so a new prescription can be sent to them before  January.   I spoke to a representative at [facility] who did advise they are no longer in network with [employer], but did advise [facility] as far as she knows should still be in network with them. She gave me a phone number to give them a call. The number is [phone].  I spoke with a representative at [facility] and he did confirm that they are in Network with [employer]. He stated that the prescription for the patients [place] can be faxed to them at [phone], and once they receive the prescription they would start the process of  taking step to create an account for the patient in order to be able to start sending El[employer]or Legato supplies to him.   Task sent to [name] requesting that prescription be sent when he gets back next week so things can be done by January so when patient's sensor runs out he will hopefully be getting a shipment with no delays.     Medications: Outpatient Encounter Medications as of 03/06/2021  Medication Sig Note   alprazolam (XANAX) 2 MG tablet TAKE 1 TABLET BY MOUTH THREE TIMES DAILY AS NEEDED FOR SLEEP    amLODipine (NORVASC) 10 MG tablet Take 10 mg by mouth daily.     amLODipine (NORVASC) 10 MG tablet Take 1 tablet by mouth daily.    B-D ULTRAFINE III SHORT PEN 31G X 8 MM MISC     BD INSULIN SYRINGE U/F 31G X 5/16" 1 ML MISC     Blood Glucose Monitoring Suppl (GLUCOCOM BLOOD GLUCOSE MONITOR) DEVI Frequency:ONCE   Dosage:0.0     Instructions:  Note:Dose: N/A    calcitRIOL (ROCALTROL) 0.25 MCG capsule     carvedilol (COREG) 6.25 MG tablet TAKE 1 TABLET TWICE A DAY WITH MEALS (NEED APPOINTMENT FOR FURTHER REFILLS)    cinacalcet (SENSIPAR) 30 MG tablet Take 30 mg by mouth daily.    dapagliflozin propanediol ([name]) 10 MG TABS tablet Take 1 tablet (10 mg total) by mouth daily before breakfast. Patient receives through [name] through Dec 2023    EDEX 40 MCG injection 40 mcg by Intracavitary route as needed.     fenofibrate (TRICOR) 145 MG tablet     insulin aspart (NOVOLOG) 100 UNIT/ML injection Inject 15 Units into the skin 3 (three) times daily before meals. 5/23/2022Frances [place] through [name] Patient Assistance through Dec 2022    insulin degludec (TRESIBA FLEXTOUCH) 100 UNIT/ML FlexTouch Pen Inject 45 Units into the skin daily. 5/23/2022Frances [place] through [name] Patient Assistance through Dec 2022   Lancets (ONETOUCH DELICA PLUS [id number]) MISC     LANTUS SOLOSTAR 100 UNIT/ML Solostar Pen     losartan (COZAAR) 100 MG tablet Take 1 tablet (100 mg total) by mouth at bedtime.    mycophenolate  (CELLCEPT) 500 MG tablet Take 1,000 mg by mouth 2 (two) times daily.    naloxone (NARCAN) nasal spray 4 mg/0.1 mL Place 1 spray into the nose once.     nicotine (NICODERM CQ) 14 mg/24hr patch Place 1 patch (14 mg total) onto the skin daily.    NOVOLOG 100 UNIT/ML injection     omeprazole (PRILOSEC) 20 MG capsule TAKE 2 CAPSULES TWICE DAILY BEFORE A MEAL    omeprazole (PRILOSEC) 20 MG capsule Take 2 capsules (40 mg total) by mouth 2 (two) times daily before a meal.    ONETOUCH VERIO test strip     oxyCODONE (OXY IR/ROXICODONE) 5 MG immediate release tablet Take 1-2 tablets (5-10 mg total) by mouth  every 6 (six) hours as needed for severe pain.    rosuvastatin (CRESTOR) 40 MG tablet Take 1 tablet (40 mg total) by mouth daily.    sildenafil (VIAGRA) 100 MG tablet TAKE ONE-HALF (1/2) TO ONE TABLET DAILY AS NEEDED FOR ERECTILE DYSFUNCTION    tacrolimus (PROGRAF) 1 MG capsule Take 3 mg by mouth 2 (two) times daily.     tretinoin (RETIN-A) 0.05 % cream Apply topically as needed.     triamcinolone cream (KENALOG) 0.1 % APPLY DAILY TO INFLAMED BUMPS AS NEEDED    XYOSTED 100 MG/0.5ML SOAJ 0.5 mLs once a week. Every sunday    No facility-administered encounter medications on file as of 03/06/2021.    Lynann Bologna, CPA/CMA Clinical Pharmacist Assistant Phone: [phone]

## 2021-03-06 NOTE — Telephone Encounter (Signed)
Omeprazole approved  put letter to be scanned

## 2021-03-07 DIAGNOSIS — N2581 Secondary hyperparathyroidism of renal origin: Secondary | ICD-10-CM | POA: Diagnosis not present

## 2021-03-07 DIAGNOSIS — N186 End stage renal disease: Secondary | ICD-10-CM | POA: Diagnosis not present

## 2021-03-07 DIAGNOSIS — Z940 Kidney transplant status: Secondary | ICD-10-CM | POA: Diagnosis not present

## 2021-03-07 DIAGNOSIS — I10 Essential (primary) hypertension: Secondary | ICD-10-CM | POA: Diagnosis not present

## 2021-03-18 ENCOUNTER — Other Ambulatory Visit: Payer: Self-pay | Admitting: Family Medicine

## 2021-03-18 NOTE — Telephone Encounter (Signed)
Requested medication (s) are due for refill today: yes  Requested medication (s) are on the active medication list: yes  Last refill:  09/07/20 #90 3 RF  Future visit scheduled: yes  Notes to clinic:  med not delegated to NT to RF   Requested Prescriptions  Pending Prescriptions Disp Refills   alprazolam (XANAX) 2 MG tablet [Pharmacy Med Name: ALPRAZolam 2 MG Oral Tablet] 90 tablet 0    Sig: TAKE 1 TABLET BY MOUTH THREE TIMES DAILY AS NEEDED FOR SLEEP     Not Delegated - Psychiatry:  Anxiolytics/Hypnotics Failed - 03/18/2021  4:34 PM      Failed - This refill cannot be delegated      Failed - Urine Drug Screen completed in last 360 days      Passed - Valid encounter within last 6 months    Recent Outpatient Visits           5 months ago Type 2 diabetes mellitus with diabetic nephropathy, without long-term current use of insulin ([place])   [facility] Birdie Sons, MD   9 months ago Type 2 diabetes mellitus with diabetic nephropathy, without long-term current use of insulin ([place])   [facility] Birdie Sons, MD   1 year ago Type 2 diabetes mellitus with diabetic nephropathy, with long-term current use of insulin ([place])   [facility] Birdie Sons, MD   1 year ago Type 2 diabetes mellitus with diabetic nephropathy, without long-term current use of insulin ([place])   [facility] Birdie Sons, MD   2 years ago Mixed hyperlipidemia   [place], Kirstie Peri, MD       Future Appointments             In 1 week Vanga, Tally Due, MD [place]   In 1 month Fisher, Kirstie Peri, MD [facility], [place]

## 2021-03-24 ENCOUNTER — Other Ambulatory Visit: Payer: Self-pay | Admitting: Family Medicine

## 2021-03-24 DIAGNOSIS — L905 Scar conditions and fibrosis of skin: Secondary | ICD-10-CM

## 2021-03-24 DIAGNOSIS — M79604 Pain in right leg: Secondary | ICD-10-CM

## 2021-03-24 NOTE — Telephone Encounter (Signed)
Medication Refill - Medication: oxyCODONE (OXY IR/ROXICODONE) 5 MG immediate release tablet  Has the patient contacted their pharmacy? Yes.    (Agent: If yes, when and what did the pharmacy advise?) Contact PCP office because its a controlled substance  Preferred Pharmacy (with phone number or street name):   [place], [place] - [place]  [address] [place] [address]  Phone:  [phone]  Fax:  [phone]   Has the patient been seen for an appointment in the last year OR does the patient have an upcoming appointment? Yes.    Agent: Please be advised that RX refills may take up to 3 business days. We ask that you follow-up with your pharmacy.

## 2021-03-24 NOTE — Telephone Encounter (Signed)
Pt called to report that he has enough to last him until Monday.

## 2021-03-24 NOTE — Telephone Encounter (Signed)
Requested medication (s) are due for refill today:   Provider to review  Requested medication (s) are on the active medication list:   Yes  Future visit scheduled:   Yes   Last ordered: 02/28/2021 #240, 0 refills  Non delegated refill   Requested Prescriptions  Pending Prescriptions Disp Refills   oxyCODONE (OXY IR/ROXICODONE) 5 MG immediate release tablet 240 tablet 0    Sig: Take 1-2 tablets (5-10 mg total) by mouth every 6 (six) hours as needed for severe pain.     Not Delegated - Analgesics:  Opioid Agonists Failed - 03/24/2021 11:57 AM      Failed - This refill cannot be delegated      Failed - Urine Drug Screen completed in last 360 days      Passed - Valid encounter within last 6 months    Recent Outpatient Visits           5 months ago Type 2 diabetes mellitus with diabetic nephropathy, without long-term current use of insulin ([place])   [facility] Birdie Sons, MD   9 months ago Type 2 diabetes mellitus with diabetic nephropathy, without long-term current use of insulin ([place])   [facility] Birdie Sons, MD   1 year ago Type 2 diabetes mellitus with diabetic nephropathy, with long-term current use of insulin ([place])   [facility] Birdie Sons, MD   1 year ago Type 2 diabetes mellitus with diabetic nephropathy, without long-term current use of insulin [facility])   [facility] Birdie Sons, MD   2 years ago Mixed hyperlipidemia   [place], Kirstie Peri, MD       Future Appointments             In 6 days Vanga, Tally Due, MD [place]   In 3 weeks Caryn Section, Kirstie Peri, MD [facility], [place]

## 2021-03-27 MED ORDER — OXYCODONE HCL 5 MG PO TABS: 5.0000 mg | ORAL_TABLET | Freq: Four times a day (QID) | ORAL | 0 refills | Status: DC | PRN

## 2021-03-28 ENCOUNTER — Telehealth: Payer: Self-pay | Admitting: Family Medicine

## 2021-03-28 NOTE — Telephone Encounter (Signed)
Medication Refill - Medication: libre 2 cgm sensor  Has the patient contacted their pharmacy? No. Pt asked for a cal back from [facility] the pharmacist?  (Agent: If no, request that the patient contact the pharmacy for the refill. If patient does not wish to contact the pharmacy document the reason why and proceed with request.)   Preferred Pharmacy (with phone number or street name):   [place], [place] - [place]  [address] [place] [address]  Phone: [phone] Fax: [phone]  Hours: Not open 24 hours    Has the patient been seen for an appointment in the last year OR does the patient have an upcoming appointment? Yes.    Agent: Please be advised that RX refills may take up to 3 business days. We ask that you follow-up with your pharmacy.

## 2021-03-29 NOTE — Telephone Encounter (Signed)
Patient informed we are working on getting [employer] sensors approved. Waiting on signature for documentation from Dr. Caryn Section now.

## 2021-03-30 ENCOUNTER — Encounter: Payer: Self-pay | Admitting: Gastroenterology

## 2021-03-30 ENCOUNTER — Ambulatory Visit (INDEPENDENT_AMBULATORY_CARE_PROVIDER_SITE_OTHER): Payer: Medicare HMO | Admitting: Gastroenterology

## 2021-03-30 ENCOUNTER — Other Ambulatory Visit: Payer: Self-pay

## 2021-03-30 VITALS — BP 155/89 | HR 97 | Temp 98.9°F | Ht 71.0 in | Wt 230.1 lb

## 2021-03-30 DIAGNOSIS — R748 Abnormal levels of other serum enzymes: Secondary | ICD-10-CM | POA: Diagnosis not present

## 2021-03-30 DIAGNOSIS — R1013 Epigastric pain: Secondary | ICD-10-CM | POA: Diagnosis not present

## 2021-03-30 DIAGNOSIS — K219 Gastro-esophageal reflux disease without esophagitis: Secondary | ICD-10-CM | POA: Diagnosis not present

## 2021-03-30 NOTE — Patient Instructions (Addendum)
Your RUQ ultrasound is scheduled for 04/14/2020 arrive to medical mall at 8:00am please check in at 7:30am to the medical mall. Nothing to eat or drink after midnight   Your gastric emptying study is schedule for 04/14/2020 arrive to medical mall at 8:45am for a 9:00am scan.   nothing to eat or drink after midnight at Hold the omeprazole 8 hours before the scan

## 2021-03-30 NOTE — Progress Notes (Signed)
James Darby, MD [address]  [place]  [place],  [address]  Main: [phone]  Fax: [phone]    Gastroenterology Consultation  Referring Provider:     Birdie Sons, MD Primary Care Physician:  Birdie Sons, MD Primary Gastroenterologist:  Dr. Cephas Moreno Reason for Consultation: Epigastric pain, reflux        HPI:   James Moreno is a 56 y.o. male referred by Dr. Birdie Sons, MD  for consultation & management of reflux, dysphagia.  Patient has history of metabolic syndrome, status post kidney transplant in 2016, secondary to diabetic nephropathy resulting in end-stage renal failure.  Patient reports for last 5 to 6 months, he has been experiencing significant regurgitation, heartburn, food not passing down in his mid chest.  He felt severe chest pressure, evaluated by cardiology as he was concerned about heart disease and it was ruled out.  He started on omeprazole 20 mg twice daily which seems to alleviate symptoms partially.  He also reports nocturnal heartburn, waking up with coughing spells and choking episodes.  Reflux symptoms worse if his last meal is after 6 PM.  He continues to smoke cigarettes, 1 pack last for 2 days.  Alcohol occasionally.  He is requesting upper endoscopy for further evaluation Patient was also evaluated by bariatric surgery team at [facility] for sleeve gastrectomy and currently under work-up today.  He lost about 9 pounds with diet modification  Patient underwent Cologuard testing 2019 and it was negative.  He prefers not to undergo colonoscopy for colon cancer screening History of hep B, spontaneously cleared  Follow-up visit 01/13/2020 He reports that, for last few months, he has been experiencing epigastric pain, reflux, early satiety, upper abdominal bloating.  He has history of diabetes, hemoglobin A1c has been worsening, 9.7 on 09/17/2019, last year it was 6.7.  He said he has been going through significant stress and  was eating out of control.  He is now trying to lose weight and managing his blood sugars with insulin.  He reports that his blood sugars are in the 120s.  He noticed that white bread was causing choking sensation, which was relieved after he stopped eating.  He is currently taking omeprazole 20 mg twice daily.  Patient was worried that he may had a hiatal hernia.  Therefore, I ordered x-ray upper GI series prior to this follow-up visit which revealed moderate reflux and possible delayed gastric emptying.  Normal hemoglobin.  Patient denies oral thrush, pain during swallowing.  He is trying to lose weight by following healthy diet.   Follow-up visit 03/30/2021 Patient is here for worsening of his upper GI symptoms, he had most recent flareup about 2 to 3 weeks ago which subsequently resolved after increasing omeprazole to 40 mg twice daily.  He is currently taking omeprazole 20 mg twice daily. He reports that if he does not eat anything for a day, he does not experience abdominal bloating and his belly is flat.  He reports ongoing reflux symptoms including burning pain in the chest associated with regurgitation as well as epigastric pain.  He has gained weight during holidays.  Patient did not undergo gastric emptying study as recommended.  His hemoglobin A1c is improving.  Patient is also concerned about hemorrhoidal symptoms including rectal pain, swelling, pressure, burning, itching.  He has tried over-the-counter Preparation H which provided temporary relief only.  He is interested in hemorrhoid ligation but not today, asked for more information  about it.  Patient had surgical hemorrhoidectomy more than 10 years ago  NSAIDs: None  Antiplts/Anticoagulants/Anti thrombotics: None  GI Procedures:  Patient undergoes Cologuard test for colon cancer screening EGD 01/21/2019 - Normal duodenal bulb and second portion of the duodenum. - Congested and inflamed mucosa in the prepyloric region of the stomach.  Biopsied. - Normal gastric body, incisura and antrum. Biopsied. - Esophagogastric landmarks identified. - Normal gastroesophageal junction and esophagus. Biopsied.    He denies family history of GI malignancy  Past Medical History:  Diagnosis Date   Acute kidney failure, unspecified ([place])    Anxiety    Diabetes mellitus, type 2 (HCC)    GERD (gastroesophageal reflux disease)    History of arterial disease of lower extremity    History of hepatitis B    Hypertension    Hypothyroidism    Mitral valve disorders(424.0)    Pityriasis 12/27/2014   Primary pulmonary HTN ([place])    Pt denies ever having.   Tricuspid valve disorders, specified as nonrheumatic     Past Surgical History:  Procedure Laterality Date   APPENDECTOMY     Carotid Doppler Ultrasound  06/14/2009   39% stenosis of bilateral internal carotid artery, bilateral anterograde vertebral flow   ESOPHAGOGASTRODUODENOSCOPY (EGD) WITH PROPOFOL N/A 01/21/2019   Procedure: ESOPHAGOGASTRODUODENOSCOPY (EGD) WITH PROPOFOL;  Surgeon: Lin Landsman, MD;  Location: [facility];  Service: Endoscopy;  Laterality: N/A;  Diabetic - insulin   EYE SURGERY     right   HEMORROIDECTOMY     KIDNEY TRANSPLANT Right 2016   MECKEL DIVERTICULUM EXCISION     infancy   Myocardial Perfusion scan  01/17/2009   [facility], non- ischemic. LVEF= 55%   PARS PLANA VITRECTOMY Left 02/09/2015   Procedure: Pan retinal photocoagulation [address];  Surgeon: Milus Height, MD;  Location: [facility];  Service: Ophthalmology;  Laterality: Left;   REFRACTIVE SURGERY Left    sleep study  01/09/2011   Severe sleep apnea. AHI 72.9/hr. RDI=83.0/hr. Desaturation to 69.0% Emergency CPAP titaration to 14.0cm (01/14/19 resolved after wt loss after kidney transplant.)    Current Outpatient Medications:    alprazolam (XANAX) 2 MG tablet, TAKE 1 TABLET BY MOUTH THREE TIMES DAILY AS NEEDED FOR SLEEP, Disp: 90 tablet, Rfl: 4   amLODipine (NORVASC) 10  MG tablet, Take 10 mg by mouth daily. , Disp: , Rfl:    amLODipine (NORVASC) 10 MG tablet, Take 1 tablet by mouth daily., Disp: , Rfl:    B-D ULTRAFINE III SHORT PEN 31G X 8 MM MISC, , Disp: , Rfl:    BD INSULIN SYRINGE U/F 31G X 5/16" 1 ML MISC, , Disp: , Rfl:    Blood Glucose Monitoring Suppl (GLUCOCOM BLOOD GLUCOSE MONITOR) DEVI, Frequency:ONCE   Dosage:0.0     Instructions:  Note:Dose: N/A, Disp: , Rfl:    calcitRIOL (ROCALTROL) 0.25 MCG capsule, , Disp: , Rfl:    carvedilol (COREG) 12.5 MG tablet, Take 12.5 mg by mouth 2 (two) times daily., Disp: , Rfl:    cinacalcet (SENSIPAR) 30 MG tablet, Take 30 mg by mouth daily., Disp: , Rfl:    dapagliflozin propanediol ([name]) 10 MG TABS tablet, Take 1 tablet (10 mg total) by mouth daily before breakfast. Patient receives through [name] through Dec 2023, Disp: 90 tablet, Rfl: 3   EDEX 40 MCG injection, 40 mcg by Intracavitary route as needed. , Disp: , Rfl:    fenofibrate (TRICOR) 145 MG tablet, , Disp: , Rfl:    insulin  aspart (NOVOLOG) 100 UNIT/ML injection, Inject 15 Units into the skin 3 (three) times daily before meals., Disp: , Rfl:    insulin degludec (TRESIBA FLEXTOUCH) 100 UNIT/ML FlexTouch Pen, Inject 45 Units into the skin daily., Disp: , Rfl:    Lancets (ONETOUCH DELICA PLUS EQASTM19Q) MISC, , Disp: , Rfl:    LANTUS SOLOSTAR 100 UNIT/ML Solostar Pen, , Disp: , Rfl:    losartan (COZAAR) 100 MG tablet, Take 1 tablet (100 mg total) by mouth at bedtime., Disp: 90 tablet, Rfl: 2   mycophenolate (CELLCEPT) 500 MG tablet, Take 1,000 mg by mouth 2 (two) times daily., Disp: , Rfl:    naloxone (NARCAN) nasal spray 4 mg/0.1 mL, Place 1 spray into the nose once. , Disp: , Rfl:    nicotine (NICODERM CQ) 14 mg/24hr patch, Place 1 patch (14 mg total) onto the skin daily., Disp: 28 patch, Rfl: 0   NOVOLOG 100 UNIT/ML injection, , Disp: , Rfl:    omeprazole (PRILOSEC) 20 MG capsule, TAKE 2 CAPSULES TWICE DAILY BEFORE A MEAL, Disp: 360 capsule, Rfl: 1    omeprazole (PRILOSEC) 20 MG capsule, Take 2 capsules (40 mg total) by mouth 2 (two) times daily before a meal., Disp: 360 capsule, Rfl: 1   ONETOUCH VERIO test strip, , Disp: , Rfl:    oxyCODONE (OXY IR/ROXICODONE) 5 MG immediate release tablet, Take 1-2 tablets (5-10 mg total) by mouth every 6 (six) hours as needed for severe pain., Disp: 240 tablet, Rfl: 0   rosuvastatin (CRESTOR) 40 MG tablet, Take 1 tablet (40 mg total) by mouth daily., Disp: 90 tablet, Rfl: 1   sildenafil (VIAGRA) 100 MG tablet, TAKE ONE-HALF (1/2) TO ONE TABLET DAILY AS NEEDED FOR ERECTILE DYSFUNCTION, Disp: 30 tablet, Rfl: 3   tacrolimus (PROGRAF) 1 MG capsule, Take 3 mg by mouth 2 (two) times daily. , Disp: , Rfl: 11   tretinoin (RETIN-A) 0.05 % cream, Apply topically as needed. , Disp: , Rfl:    triamcinolone cream (KENALOG) 0.1 %, APPLY DAILY TO INFLAMED BUMPS AS NEEDED, Disp: , Rfl:    XYOSTED 100 MG/0.5ML SOAJ, 0.5 mLs once a week. Every sunday, Disp: , Rfl:    Family History  Problem Relation Age of Onset   Hypertension Mother    Hyperlipidemia Mother    Melanoma Father      Social History   Tobacco Use   Smoking status: Every Day    Packs/day: 0.75    Years: 38.00    Pack years: 28.50    Types: Cigarettes   Smokeless tobacco: Never   Tobacco comments:    since age 79.  Vaping Use   Vaping Use: Former  Substance Use Topics   Alcohol use: Yes    Alcohol/week: 0.0 - 1.0 standard drinks    Comment: Excessive alcohol consumption in the past. Quit around 2016. Drinks occasionally.   Drug use: No    Allergies as of 03/30/2021 - Review Complete 03/30/2021  Allergen Reaction Noted   No known allergies  05/21/2018    Review of Systems:    All systems reviewed and negative except where noted in HPI.   Physical Exam:  BP (!) 155/89 (BP Location: Left Arm, Patient Position: Sitting, Cuff Size: Normal)    Pulse 97    Temp 98.9 F (37.2 C) (Oral)    Ht 5\' 11"  (1.803 m)    Wt 230 lb 2 oz (104.4 kg)     BMI 32.10 kg/m  No LMP for male patient.  General:   Alert,  Well-developed, well-nourished, pleasant and cooperative in NAD Head:  Normocephalic and atraumatic. Eyes:  Sclera clear, no icterus.   Conjunctiva pink. Ears:  Normal auditory acuity. Nose:  No deformity, discharge, or lesions. Mouth:  No deformity or lesions,oropharynx pink & moist. Neck:  Supple; no masses or thyromegaly. Lungs:  Respirations even and unlabored.  Clear throughout to auscultation.   No wheezes, crackles, or rhonchi. No acute distress. Heart:  Regular rate and rhythm; no murmurs, clicks, rubs, or gallops. Abdomen:  Normal bowel sounds. Soft, obese, non-tender and non-distended without masses, hepatosplenomegaly or hernias noted.  No guarding or rebound tenderness.   Rectal: Not performed Msk:  Symmetrical without gross deformities. Good, equal movement & strength bilaterally. Pulses:  Normal pulses noted. Extremities:  No clubbing or edema.  No cyanosis. Neurologic:  Alert and oriented x3;  grossly normal neurologically. Skin:  Intact without significant lesions or rashes. No jaundice. Psych:  Alert and cooperative. Normal mood and affect.  Imaging Studies: Reviewed  Assessment and Plan:   AVELARDO REESMAN is a 56 y.o. male with history of metabolic syndrome, diabetes on insulin, status post renal transplant on immunosuppression seen in consultation for chronic GERD and dysphagia, currently in partial remission on omeprazole 20 mg twice daily.  Patient underwent EGD in 12/2018, unremarkable. X-ray upper GI series in 12/2019 revealed moderate reflux, no evidence of hernia  Chronic GERD:  Fairly controlled on omeprazole 20 mg p.o. twice daily EGD in 12/2018 unremarkable including esophageal biopsies Continue antireflux lifestyle modification Patient deferred EGD at this time  Dyspepsia Concerned about diabetic gastroparesis given his symptoms Discussed about gastroparesis diet, information  provided Continue PPI twice daily Reiterated on tight control of diabetes Encouraged to quit smoking Recommend gastric emptying study  Elevated alkaline phosphatase Hepatitis B surface antigen, antibody, core antibody and HCV negative Given history of metabolic syndrome, recommend right upper quadrant ultrasound  Follow up in 4 months   James Darby, MD

## 2021-03-31 ENCOUNTER — Telehealth: Payer: Self-pay

## 2021-03-31 NOTE — Progress Notes (Signed)
Chronic Care Management Pharmacy Assistant   Name: James Moreno  MRN: [id number] DOB: 1965-08-04  DME Coordination [employer]  Patient is calling to check status of his [employer] order. I submitted a task to [name] on 03/10/2021 advising that [facility] was no longer in network with the patient's insurance and patient would need to have a new prescription submitted to [facility]. [name] was on PTO when the task was submitted, but it was there for him to complete when he arrived back. [name] did start the process of completing paperwork for the patient's [place] to be submitted to the new DME Company, but had to wait on PCP's signature.  Today [name] was able to fax paperwork over to [facility] for processing of the [facility] order for the patient.  Patient called to advise he only has 3 hours left on his sensor, and wasn't happy that this hasn't been taken care of. I informed the patient that due to the holidays, and providers not being in office we were able to take care of this issue as fast as we could. I advised the patient unfortunately PCP just signed off on the order today, and it was just faxed over to [facility] today so it still could be another week or 2 before he will get his sensors. I advised the patient that in the meantime he will have to perform fingerstick's to check his bloodsugar levels. I asked the patient does he have enough lancets, and test strips to hold him for a few weeks until he finds out when he will get his order. He advised he did. Patient stated he was just going out of town, and did not want to do finger sticks. I informed him that is the best that can be done unless he would like a prescription called into the pharmacy, but it would be a cash pay option as Medicare will not cover these devices through a regular pharmacy. I also informed him they typically come 2 to a box and the price range is from $130-150 a box cash pay. Patient stated he was unable to  pay that amount so he would just do finger sticks until [facility] processes his order. I advised him that as long as all the correct paperwork was submitted to them that they probably would be giving him a call to verify and setup and account for him so they can process his order.   I did reach out to [facility] on the patient's behalf just to make sure they received the paperwork we sent over. I spoke with Malachy Mood who confirmed they did. She stated that it will take about a week for them to do their process. She stated they will have to setup a file for him, check his insurance, then they will contact him regarding any copayment's he will have and if he would like to proceed they will send another form to the providers office for him to sign off on. So with the whole process it will be about 2 weeks before they even ship the sensors to the patient.  Medications: Outpatient Encounter Medications as of 03/31/2021  Medication Sig Note   alprazolam (XANAX) 2 MG tablet TAKE 1 TABLET BY MOUTH THREE TIMES DAILY AS NEEDED FOR SLEEP    amLODipine (NORVASC) 10 MG tablet Take 10 mg by mouth daily.     amLODipine (NORVASC) 10 MG tablet Take 1 tablet by mouth daily.  B-D ULTRAFINE III SHORT PEN 31G X 8 MM MISC     BD INSULIN SYRINGE U/F 31G X 5/16" 1 ML MISC     Blood Glucose Monitoring Suppl (GLUCOCOM BLOOD GLUCOSE MONITOR) DEVI Frequency:ONCE   Dosage:0.0     Instructions:  Note:Dose: N/A    calcitRIOL (ROCALTROL) 0.25 MCG capsule     carvedilol (COREG) 12.5 MG tablet Take 12.5 mg by mouth 2 (two) times daily.    cinacalcet (SENSIPAR) 30 MG tablet Take 30 mg by mouth daily.    dapagliflozin propanediol ([name]) 10 MG TABS tablet Take 1 tablet (10 mg total) by mouth daily before breakfast. Patient receives through [name] through Dec 2023    EDEX 40 MCG injection 40 mcg by Intracavitary route as needed.     fenofibrate (TRICOR) 145 MG tablet     insulin aspart (NOVOLOG) 100 UNIT/ML injection Inject 15 Units  into the skin 3 (three) times daily before meals. 5/23/2022Frances [place] through [name] Patient Assistance through Dec 2022    insulin degludec (TRESIBA FLEXTOUCH) 100 UNIT/ML FlexTouch Pen Inject 45 Units into the skin daily. 5/23/2022Frances [place] through [name] Patient Assistance through Dec 2022   Lancets (ONETOUCH DELICA PLUS [id number]) MISC     LANTUS SOLOSTAR 100 UNIT/ML Solostar Pen     losartan (COZAAR) 100 MG tablet Take 1 tablet (100 mg total) by mouth at bedtime.    mycophenolate (CELLCEPT) 500 MG tablet Take 1,000 mg by mouth 2 (two) times daily.    naloxone (NARCAN) nasal spray 4 mg/0.1 mL Place 1 spray into the nose once.     nicotine (NICODERM CQ) 14 mg/24hr patch Place 1 patch (14 mg total) onto the skin daily.    NOVOLOG 100 UNIT/ML injection     omeprazole (PRILOSEC) 20 MG capsule TAKE 2 CAPSULES TWICE DAILY BEFORE A MEAL    omeprazole (PRILOSEC) 20 MG capsule Take 2 capsules (40 mg total) by mouth 2 (two) times daily before a meal.    ONETOUCH VERIO test strip     oxyCODONE (OXY IR/ROXICODONE) 5 MG immediate release tablet Take 1-2 tablets (5-10 mg total) by mouth every 6 (six) hours as needed for severe pain.    rosuvastatin (CRESTOR) 40 MG tablet Take 1 tablet (40 mg total) by mouth daily.    sildenafil (VIAGRA) 100 MG tablet TAKE ONE-HALF (1/2) TO ONE TABLET DAILY AS NEEDED FOR ERECTILE DYSFUNCTION    tacrolimus (PROGRAF) 1 MG capsule Take 3 mg by mouth 2 (two) times daily.     tretinoin (RETIN-A) 0.05 % cream Apply topically as needed.     triamcinolone cream (KENALOG) 0.1 % APPLY DAILY TO INFLAMED BUMPS AS NEEDED    XYOSTED 100 MG/0.5ML SOAJ 0.5 mLs once a week. Every sunday    No facility-administered encounter medications on file as of 03/31/2021.    Lynann Bologna, CPA/CMA Clinical Pharmacist Assistant Phone: [phone]

## 2021-04-10 ENCOUNTER — Other Ambulatory Visit: Payer: Self-pay

## 2021-04-10 DIAGNOSIS — L905 Scar conditions and fibrosis of skin: Secondary | ICD-10-CM

## 2021-04-10 DIAGNOSIS — R52 Pain, unspecified: Secondary | ICD-10-CM

## 2021-04-10 DIAGNOSIS — M79604 Pain in right leg: Secondary | ICD-10-CM

## 2021-04-10 NOTE — Telephone Encounter (Signed)
Please review.  He states he only needs about a 10 day supply until his mother can mail down his pill bottles.  He states the nephrologist was able to fill the other prescriptions he just needs the ones pended.   Thanks,   -Mickel Baas

## 2021-04-10 NOTE — Addendum Note (Signed)
Addended by: Ashley Royalty E on: 04/10/2021 01:28 PM   Modules accepted: Orders

## 2021-04-10 NOTE — Telephone Encounter (Signed)
Copied from [facility] [id number]. Topic: General - Other >> Apr 10, 2021 12:05 PM Valere Dross wrote: Reason for CRM: Pt called in stating he went to visit his mother out of state and ended up leaving his medications behind, and needed a temporary refill, and requested if a nurse could give him a call back because there were a few he needs to get, please advise.

## 2021-04-11 MED ORDER — AMLODIPINE BESYLATE 10 MG PO TABS: 10.0000 mg | ORAL_TABLET | Freq: Every day | ORAL | 0 refills | Status: DC

## 2021-04-11 MED ORDER — OXYCODONE HCL 5 MG PO TABS: 5.0000 mg | ORAL_TABLET | Freq: Four times a day (QID) | ORAL | 0 refills | Status: DC | PRN

## 2021-04-11 MED ORDER — CINACALCET HCL 30 MG PO TABS
30.0000 mg | ORAL_TABLET | Freq: Every day | ORAL | 0 refills | Status: DC
Start: 2021-04-11 — End: 2022-04-27

## 2021-04-11 MED ORDER — LOSARTAN POTASSIUM 100 MG PO TABS: 100.0000 mg | ORAL_TABLET | Freq: Every day | ORAL | 0 refills | Status: DC

## 2021-04-11 MED ORDER — CALCITRIOL 0.25 MCG PO CAPS: 0.2500 ug | ORAL_CAPSULE | Freq: Every day | ORAL | 0 refills | Status: DC

## 2021-04-11 MED ORDER — ALPRAZOLAM 2 MG PO TABS: 2.0000 mg | ORAL_TABLET | Freq: Three times a day (TID) | ORAL | 0 refills | Status: DC | PRN

## 2021-04-11 MED ORDER — ROSUVASTATIN CALCIUM 40 MG PO TABS: 40.0000 mg | ORAL_TABLET | Freq: Every day | ORAL | 0 refills | Status: DC

## 2021-04-11 NOTE — Addendum Note (Signed)
Addended by: Birdie Sons on: 04/11/2021 07:41 AM   Modules accepted: Orders

## 2021-04-12 NOTE — Telephone Encounter (Signed)
Pt called in and requested to speak directly with [name], please advise.

## 2021-04-13 ENCOUNTER — Telehealth: Payer: Self-pay

## 2021-04-13 NOTE — Telephone Encounter (Signed)
Patient called he is gonna cancel his procedure tomorrow till dr Marius Ditch can talk to his kidney doctor then he will feel safe to do patient will call and cancel scan

## 2021-04-13 NOTE — Telephone Encounter (Signed)
Called patient let him know there is no contrast in his gastric emptying study so he said he will keep appointment

## 2021-04-13 NOTE — Progress Notes (Signed)
Chronic Care Management Pharmacy Assistant   Name: James Moreno  MRN: [id number] DOB: [birth year]  Reason for Encounter: Diabetes Disease State/PAP Prescription Status Novo Nordisk/DME follow-up  I contacted [place] [facility] regarding the patient's prescription for his Ozempic. I spoke with a representative who confirmed that the [place] application still has not been received. The representative advised that they only received the 1287 renewal application once in November and again in January. I informed the [name], and requested him to refax it again. The representative provided me with a different fax number of [phone]. Spoke with [facility], and they confirmed that the only thing missing was patient agreeing to delivery in which they have been calling the patient, but someone put the wrong number on patient's account. Patient was advised of this and given the number to call them directly. They also advised that the prescription they received was missing providers signature and date. Informed [name] and he refaxed prescription over today.    I spoke with the patient and provided him with all the updates.  Recent office visits:  None ID  Recent consult visits:  03/30/2021 James Landsman, MD (Gastroenterology) for GERD- No medication changes noted, NM Gastric Emptying placed US Abdomen Limited RUQ Placed, patient instructed to follow-up in four months  03/07/2021 James Lateef, MD (Nephrology) for Follow-up- No medication changes note, no orders placed, patient instructed to follow-up in 3 months  03/01/2021 James Moreno, DPM (Podiatry) for Follow-up- No medication changes noted, no orders placed, patient instructed to follow-up in 3 months  02/28/2021 James Arthurs, MD (Endocrinology)- for Initial Consult- Started: Ozempic 0.25 mg once a week for the 5th and 6th injection take 0.5 mg, no orders placed, patient instructed to follow-up in 4 months  Hospital visits:  None in  previous 6 months  Medications: Outpatient Encounter Medications as of 04/13/2021  Medication Sig Note   alprazolam (XANAX) 2 MG tablet Take 1 tablet (2 mg total) by mouth 3 (three) times daily as needed for sleep.    amLODipine (NORVASC) 10 MG tablet Take 10 mg by mouth daily.     amLODipine (NORVASC) 10 MG tablet Take 1 tablet (10 mg total) by mouth daily.    B-D ULTRAFINE III SHORT PEN 31G X 8 MM MISC     BD INSULIN SYRINGE U/F 31G X 5/16" 1 ML MISC     Blood Glucose Monitoring Suppl (GLUCOCOM BLOOD GLUCOSE MONITOR) DEVI Frequency:ONCE   Dosage:0.0     Instructions:  Note:Dose: N/A    calcitRIOL (ROCALTROL) 0.25 MCG capsule Take 1 capsule (0.25 mcg total) by mouth daily.    carvedilol (COREG) 12.5 MG tablet Take 12.5 mg by mouth 2 (two) times daily.    cinacalcet (SENSIPAR) 30 MG tablet Take 1 tablet (30 mg total) by mouth daily.    dapagliflozin propanediol ([name]) 10 MG TABS tablet Take 1 tablet (10 mg total) by mouth daily before breakfast. Patient receives through [name] through Dec 2023    EDEX 40 MCG injection 40 mcg by Intracavitary route as needed.     fenofibrate (TRICOR) 145 MG tablet     insulin aspart (NOVOLOG) 100 UNIT/ML injection Inject 15 Units into the skin 3 (three) times daily before meals. 5/23/2022Frances [place] through [name] Patient Assistance through Dec 2022    insulin degludec (TRESIBA FLEXTOUCH) 100 UNIT/ML FlexTouch Pen Inject 45 Units into the skin daily. 5/23/2022Frances [place] through [name] Patient Assistance through Dec 2022   Lancets [facility] PLUS  LANCET30G) MISC     LANTUS SOLOSTAR 100 UNIT/ML Solostar Pen     losartan (COZAAR) 100 MG tablet Take 1 tablet (100 mg total) by mouth at bedtime.    mycophenolate (CELLCEPT) 500 MG tablet Take 1,000 mg by mouth 2 (two) times daily.    naloxone (NARCAN) nasal spray 4 mg/0.1 mL Place 1 spray into the nose once.     nicotine (NICODERM CQ) 14 mg/24hr patch Place 1 patch (14 mg total) onto the skin  daily.    NOVOLOG 100 UNIT/ML injection     omeprazole (PRILOSEC) 20 MG capsule TAKE 2 CAPSULES TWICE DAILY BEFORE A MEAL    omeprazole (PRILOSEC) 20 MG capsule Take 2 capsules (40 mg total) by mouth 2 (two) times daily before a meal.    ONETOUCH VERIO test strip     oxyCODONE (OXY IR/ROXICODONE) 5 MG immediate release tablet Take 1-2 tablets (5-10 mg total) by mouth every 6 (six) hours as needed for severe pain.    rosuvastatin (CRESTOR) 40 MG tablet Take 1 tablet (40 mg total) by mouth daily.    sildenafil (VIAGRA) 100 MG tablet TAKE ONE-HALF (1/2) TO ONE TABLET DAILY AS NEEDED FOR ERECTILE DYSFUNCTION    tacrolimus (PROGRAF) 1 MG capsule Take 3 mg by mouth 2 (two) times daily.     tretinoin (RETIN-A) 0.05 % cream Apply topically as needed.     triamcinolone cream (KENALOG) 0.1 % APPLY DAILY TO INFLAMED BUMPS AS NEEDED    XYOSTED 100 MG/0.5ML SOAJ 0.5 mLs once a week. Every sunday    No facility-administered encounter medications on file as of 04/13/2021.   Care Gaps: HIV Screening Zoster Vaccines Tetanus/TDAP Diabetic Eye Exam  Star Rating Drugs: Losartan 100 mg last filled on 03/01/2021 for a 90-Day supply with [facility] Rosuvastatin 40 mg last filled on 09/16/2020 for a 90-Day supply with James Moreno 10 mg patient on patient assistance with [name]  Recent Relevant Labs: Lab Results  Component Value Date/Time   HGBA1C 8.8 05/25/2020 12:00 AM   HGBA1C 9.7 (H) 09/17/2019 02:17 PM   HGBA1C 6.7 12/12/2018 12:00 AM   MICROALBUR 100 12/31/2016 02:13 PM    Kidney Function Lab Results  Component Value Date/Time   CREATININE 1.72 (H) 06/20/2020 09:03 AM   CREATININE 1.92 (H) 09/17/2019 02:17 PM   CREATININE 9.88 (H) 06/21/2013 02:29 PM   GFRNONAA 39 (L) 09/17/2019 02:17 PM   GFRNONAA 6 (L) 06/21/2013 02:29 PM   GFRAA 45 (L) 09/17/2019 02:17 PM   GFRAA 6 (L) 06/21/2013 02:29 PM    Current antihyperglycemic regimen:  Faxiga 10 mg 1 tablet daily Ozempic 0.25  mg (patient is not taking at this time waiting on PAP approval) Novolog 15 units three times daily + 2 units for every 50 units above 150  Tresiba 45 units daily  What recent interventions/DTPs have been made to improve glycemic control:  Patient was started on Ozempic but has not yet started due to cost  Have there been any recent hospitalizations or ED visits since last visit with [name]? No  Patient denies hypoglycemic symptoms, including Pale, Sweaty, Shaky, Hungry, Nervous/irritable, and Vision changes  Patient denies hyperglycemic symptoms, including blurry vision, excessive thirst, fatigue, polyuria, and weakness  How often are you checking your blood sugar? 3-4 times daily  What are your blood sugars ranging? Patient advised that yesterday it went low to 43, but he was able to correct it, and he said the highest he has gone in the last  week was 250, but he tested right after eating something sweet. Patient wasn't home so he could not give me all his numbers. He did advise he is aware how to self correct his numbers when they are to low or to high.   During the week, how often does your blood glucose drop below 70?  Patient went low one day this week.  Are you checking your feet daily/regularly? Yes  Adherence Review: Is the patient currently on a STATIN medication? Yes Is the patient currently on ACE/ARB medication? Yes Does the patient have >5 day gap between last estimated fill dates? No  Patient stated he spoke with [facility], and he has that all straighten out. He stated they informed him the only thing they are waiting on is the prescription. I informed him that was sent over this morning. Patient reports that he also had a visit with Nephrology, and was informed that his kidney function levels are a little abnormal and he is worried about that. Patient hasn't started Ozempic yet as he is still waiting on [place] to approve his prescription as he can not afford this  medication. Due to his kidneys he is anxious to start on the medication though. I advised him I would contact his Endocrinologist to see if they can provide him samples.   I contacted Dr. Honor Junes Office, and they advised they do not have samples of Ozempic in office. The representative also stated that Ozempic is on back order at this time, and it's hard to get.    Patient has a telephone appointment with [name], [name] on 05/05/2021 @ [place], CPA/CMA Catering manager Phone: [phone]

## 2021-04-14 ENCOUNTER — Encounter: Admission: RE | Admit: 2021-04-14 | Payer: Medicare HMO | Source: Ambulatory Visit

## 2021-04-14 ENCOUNTER — Ambulatory Visit
Admission: RE | Admit: 2021-04-14 | Discharge: 2021-04-14 | Disposition: A | Discharge status: Home / Self Care | Payer: Medicare HMO | Source: Ambulatory Visit | Attending: Gastroenterology | Admitting: Gastroenterology

## 2021-04-14 ENCOUNTER — Other Ambulatory Visit: Payer: Self-pay

## 2021-04-14 DIAGNOSIS — R748 Abnormal levels of other serum enzymes: Secondary | ICD-10-CM | POA: Insufficient documentation

## 2021-04-14 DIAGNOSIS — K838 Other specified diseases of biliary tract: Secondary | ICD-10-CM | POA: Diagnosis not present

## 2021-04-17 ENCOUNTER — Encounter: Payer: Self-pay | Admitting: Gastroenterology

## 2021-04-17 ENCOUNTER — Ambulatory Visit: Payer: Self-pay | Admitting: Family Medicine

## 2021-04-17 DIAGNOSIS — K838 Other specified diseases of biliary tract: Secondary | ICD-10-CM

## 2021-04-18 ENCOUNTER — Telehealth: Payer: Self-pay

## 2021-04-18 NOTE — Telephone Encounter (Signed)
Called and left a message for call back  

## 2021-04-18 NOTE — Telephone Encounter (Signed)
-----   Message from Lin Landsman, MD sent at 04/17/2021  4:54 PM EST ----- Caryl Pina  I sent message regarding the ultrasound results via MyChart.  Please call patient when you get a chance to go over the MyChart message and about the MRCP  Thanks RV

## 2021-04-20 NOTE — Telephone Encounter (Signed)
Please review the guidelines for diagnosis and then please sign and let me know

## 2021-04-20 NOTE — Telephone Encounter (Signed)
Patient MRCP is schedule for 04/27/21 [facility] medical mall at 4:30pm for a 5:00am scan. Nothing to eat or drink 4 hours before.

## 2021-04-25 ENCOUNTER — Other Ambulatory Visit: Payer: Self-pay | Admitting: Family Medicine

## 2021-04-25 DIAGNOSIS — M79604 Pain in right leg: Secondary | ICD-10-CM

## 2021-04-25 DIAGNOSIS — E1121 Type 2 diabetes mellitus with diabetic nephropathy: Secondary | ICD-10-CM | POA: Diagnosis not present

## 2021-04-25 DIAGNOSIS — L905 Scar conditions and fibrosis of skin: Secondary | ICD-10-CM

## 2021-04-25 NOTE — Telephone Encounter (Signed)
Patient called to clarify the medications needed. He says he only needs Oxycodone sent to the [facility] on [address] because they have it in stock and the other [facility] it was sent to on 04/11/21 says it's on back order. He says he's there waiting on the prescription to be sent. I advised I will send to Dr. Caryn Section.

## 2021-04-25 NOTE — Telephone Encounter (Signed)
Patient all out of this medication, he had previously left ,he never picked this up before on Jan 17.He is asking for short supply, until refill comes in. Medication Refill - Medication:  [facility] [address] [place]), [place] - [place] Phone:  [phone]  Fax:  [phone]      Has the patient contacted their pharmacy? Yes/not in stock/ needs sent to another location in stock/ (Agent: If no, request that the patient contact the pharmacy for the refill. If patient does not wish to contact the pharmacy document the reason why and proceed with request.) (Agent: If yes, when and what did the pharmacy advise?)send to another location in stock  Preferred Pharmacy (with phone number or street name): [facility]), [place] - [address] Phone:  [phone]  Fax:  [phone]     Has the patient been seen for an appointment in the last year OR does the patient have an upcoming appointment? yes  Agent: Please be advised that RX refills may take up to 3 business days. We ask that you follow-up with your pharmacy.

## 2021-04-26 MED ORDER — OXYCODONE HCL 5 MG PO TABS: 5.0000 mg | ORAL_TABLET | Freq: Four times a day (QID) | ORAL | 0 refills | Status: DC | PRN

## 2021-04-27 ENCOUNTER — Other Ambulatory Visit: Payer: Self-pay

## 2021-04-27 ENCOUNTER — Other Ambulatory Visit: Payer: Self-pay | Admitting: Gastroenterology

## 2021-04-27 ENCOUNTER — Telehealth: Payer: Self-pay | Admitting: Family Medicine

## 2021-04-27 ENCOUNTER — Ambulatory Visit
Admission: RE | Admit: 2021-04-27 | Discharge: 2021-04-27 | Disposition: A | Discharge status: Home / Self Care | Payer: Medicare HMO | Source: Ambulatory Visit | Attending: Gastroenterology | Admitting: Gastroenterology

## 2021-04-27 DIAGNOSIS — K838 Other specified diseases of biliary tract: Secondary | ICD-10-CM

## 2021-04-27 NOTE — Telephone Encounter (Signed)
Reason for CRM: Patient called in and stated that he need to speak to a nurse about his medication did not say what it is just that when he call and ask for the nurse all he get is [place] people and he need Dr Caryn Section nurse to call him say its not for a refill or anything like that he just need to talk to the nurse. Can be reached at Ph# [phone]     Chief Complaint: Medication refill issues Symptoms: na Frequency: na Pertinent Negatives: Patient denies na Disposition: [] ED /[] Urgent Care (no appt availability in office) / [] Appointment(In office/virtual)/ []  [facility]/ [] Home Care/ [] Refused Recommended Disposition /[] [facility]/ []  Follow-up with PCP Additional Notes:    Pt is short his Oxycodone. PT given a refill of 60 tablets not the 240 he gets every month.  Pt is worried he will run out of medication. Pt has also had trouble with [facility] not having medication in stock.   Pt visited his mother in [place] and left his medication there. His mom sent him the medication, but he ran out of his oxycodone, while waiting on medication to come from his mom. This also occurred on Gwynne Edinger, [place]. Holiday weekend.   Pt called and requested a small amount from Dr. Caryn Section to last until shipment arrived from his mom. This Rx was refilled for him on 04/11/2021 for 60 tablets to hold him over until meds came.  And all was fine.   Pt refills this medication at the end of the month.  Pt requested his usual medication refill. When pt picked up the medication, he was given 60 tablets, not the 240 usually given. He thinks that the refill was a "repeat" from the 17th.

## 2021-04-27 NOTE — Telephone Encounter (Signed)
Copied from [place] [id number]. Topic: General - Other >> Apr 27, 2021 12:35 PM Leward Quan A wrote: Reason for CRM: Patient called in and stated that he need to speak to a nurse about his medication did not say what it is just that when he call and ask for the nurse all he get is [place] people and he need Dr Caryn Section nurse to call him say its not for a refill or anything like that he just need to talk to the nurse. Can be reached at Ph# [phone]

## 2021-04-27 NOTE — Telephone Encounter (Signed)
Patient called, left VM to return the call to the office to speak to a nurse about his medications.

## 2021-04-28 ENCOUNTER — Telehealth: Payer: Self-pay

## 2021-04-28 ENCOUNTER — Other Ambulatory Visit: Payer: Medicare HMO

## 2021-04-28 NOTE — Telephone Encounter (Signed)
Patient advised. He says he never picked up the early refill that was sent in on 04/11/2021. He was able to have his mom send his medication that he left at her home. Yesterday when patient went to the pharmacy to pick up his normal prescription the pharmacy gave him the partial prescription of 60 tablets. Patient wants to know when he will have another prescription sent in? He uses [facility] [address]

## 2021-04-28 NOTE — Progress Notes (Signed)
Chronic Care Management Pharmacy Assistant   Name: James Moreno  MRN: [id number] DOB: 06-09-1965  Patient Assistance for [place]  The patient had a new prescription for Ozempic given to him several months ago by his Endocrinologist. The patient was approved for other medications through patient assistance with [name], but since the [place] was a new medication they would not process the prescription for this one until 03/2021. The prescription was faxed to [facility] several times through out the month of January, and every time I called to check status of the prescription the representative I would speak with would inform me that they did not receive the prescription at all.  I contacted [name] today to check status of Ozempic prescription as the [name] refaxed it again to them. The representative was able to confirm receipt of the prescription and she sent it for processing. The representative advised it could take 10-14 business days and up to 30 since there is a delay in there shipping products at this time.   I contacted the patient to give him an update on this prescription. I left a HIPPA complaint voice mail as patient is okay with detail in his voicemail that informed him that if he has not heard anything regarding his medication by the end of the month he can give me a call back, and I will contact [facility] to check shipping status for him.  Medications: Outpatient Encounter Medications as of 04/28/2021  Medication Sig Note   alprazolam (XANAX) 2 MG tablet Take 1 tablet (2 mg total) by mouth 3 (three) times daily as needed for sleep.    amLODipine (NORVASC) 10 MG tablet Take 10 mg by mouth daily.     amLODipine (NORVASC) 10 MG tablet Take 1 tablet (10 mg total) by mouth daily.    B-D ULTRAFINE III SHORT PEN 31G X 8 MM MISC     BD INSULIN SYRINGE U/F 31G X 5/16" 1 ML MISC     Blood Glucose Monitoring Suppl (GLUCOCOM BLOOD GLUCOSE MONITOR) DEVI Frequency:ONCE   Dosage:0.0      Instructions:  Note:Dose: N/A    calcitRIOL (ROCALTROL) 0.25 MCG capsule Take 1 capsule (0.25 mcg total) by mouth daily.    carvedilol (COREG) 12.5 MG tablet Take 12.5 mg by mouth 2 (two) times daily.    cinacalcet (SENSIPAR) 30 MG tablet Take 1 tablet (30 mg total) by mouth daily.    dapagliflozin propanediol ([name]) 10 MG TABS tablet Take 1 tablet (10 mg total) by mouth daily before breakfast. Patient receives through [name] through Dec 2023    EDEX 40 MCG injection 40 mcg by Intracavitary route as needed.     fenofibrate (TRICOR) 145 MG tablet     insulin aspart (NOVOLOG) 100 UNIT/ML injection Inject 15 Units into the skin 3 (three) times daily before meals. 5/23/2022Frances [place]wood through [name] Patient Assistance through Dec 2022    insulin degludec (TRESIBA FLEXTOUCH) 100 UNIT/ML FlexTouch Pen Inject 45 Units into the skin daily. 5/23/2022Frances [place]wood through [name] Patient Assistance through Dec 2022   Lancets (ONETOUCH DELICA PLUS [id number]) MISC     LANTUS SOLOSTAR 100 UNIT/ML Solostar Pen     losartan (COZAAR) 100 MG tablet Take 1 tablet (100 mg total) by mouth at bedtime.    mycophenolate (CELLCEPT) 500 MG tablet Take 1,000 mg by mouth 2 (two) times daily.    naloxone (NARCAN) nasal spray 4 mg/0.1 mL Place 1 spray into the nose once.  nicotine (NICODERM CQ) 14 mg/24hr patch Place 1 patch (14 mg total) onto the skin daily.    NOVOLOG 100 UNIT/ML injection     omeprazole (PRILOSEC) 20 MG capsule TAKE 2 CAPSULES TWICE DAILY BEFORE A MEAL    omeprazole (PRILOSEC) 20 MG capsule Take 2 capsules (40 mg total) by mouth 2 (two) times daily before a meal.    ONETOUCH VERIO test strip     oxyCODONE (OXY IR/ROXICODONE) 5 MG immediate release tablet Take 1-2 tablets (5-10 mg total) by mouth every 6 (six) hours as needed for severe pain.    rosuvastatin (CRESTOR) 40 MG tablet Take 1 tablet (40 mg total) by mouth daily.    sildenafil (VIAGRA) 100 MG tablet TAKE ONE-HALF (1/2) TO ONE  TABLET DAILY AS NEEDED FOR ERECTILE DYSFUNCTION    tacrolimus (PROGRAF) 1 MG capsule Take 3 mg by mouth 2 (two) times daily.     tretinoin (RETIN-A) 0.05 % cream Apply topically as needed.     triamcinolone cream (KENALOG) 0.1 % APPLY DAILY TO INFLAMED BUMPS AS NEEDED    XYOSTED 100 MG/0.5ML SOAJ 0.5 mLs once a week. Every sunday    No facility-administered encounter medications on file as of 04/28/2021.    Lynann Bologna, CPA/CMA Clinical Pharmacist Assistant Phone: [phone]

## 2021-04-28 NOTE — Telephone Encounter (Signed)
Where does he want to get this filled?   His refill quantity changed when he requested early refill back in January. We can change next refill back to the usual 240 for the month, but need to know which pharmacy he's planning on using from now on.

## 2021-05-01 ENCOUNTER — Telehealth: Payer: Self-pay

## 2021-05-01 ENCOUNTER — Other Ambulatory Visit: Payer: Self-pay

## 2021-05-01 ENCOUNTER — Other Ambulatory Visit: Payer: Self-pay | Admitting: Gastroenterology

## 2021-05-01 ENCOUNTER — Ambulatory Visit
Admission: RE | Admit: 2021-05-01 | Discharge: 2021-05-01 | Disposition: A | Discharge status: Home / Self Care | Payer: Medicare HMO | Source: Ambulatory Visit | Attending: Gastroenterology | Admitting: Gastroenterology

## 2021-05-01 DIAGNOSIS — K760 Fatty (change of) liver, not elsewhere classified: Secondary | ICD-10-CM | POA: Diagnosis not present

## 2021-05-01 DIAGNOSIS — N261 Atrophy of kidney (terminal): Secondary | ICD-10-CM | POA: Diagnosis not present

## 2021-05-01 DIAGNOSIS — N281 Cyst of kidney, acquired: Secondary | ICD-10-CM | POA: Diagnosis not present

## 2021-05-01 DIAGNOSIS — K838 Other specified diseases of biliary tract: Secondary | ICD-10-CM | POA: Diagnosis not present

## 2021-05-01 NOTE — Telephone Encounter (Signed)
Patient states he is at the medical mall now and he is going to have the MRI done

## 2021-05-01 NOTE — Telephone Encounter (Signed)
James Moreno  Please inform patient that the alternative study is endoscopic ultrasound which is an upper endoscopic procedure with ultrasound technique to help visualize the bile ducts.  If he is willing to proceed with it, please refer him to Dr. Rush Landmark  Thanks RV

## 2021-05-01 NOTE — Telephone Encounter (Signed)
Please advised if there is any other test

## 2021-05-01 NOTE — Telephone Encounter (Signed)
Wants to know if there is any other imaging he can do and not do the mri he is claustrophobic And has already tried to and panicked and had to reschedule till today at 430 wants you to call him and let him know

## 2021-05-02 ENCOUNTER — Other Ambulatory Visit: Payer: Self-pay | Admitting: Family Medicine

## 2021-05-02 DIAGNOSIS — M79604 Pain in right leg: Secondary | ICD-10-CM

## 2021-05-02 DIAGNOSIS — L905 Scar conditions and fibrosis of skin: Secondary | ICD-10-CM

## 2021-05-02 MED ORDER — OXYCODONE HCL 5 MG PO TABS: 5.0000 mg | ORAL_TABLET | Freq: Four times a day (QID) | ORAL | 0 refills | Status: DC | PRN

## 2021-05-03 ENCOUNTER — Encounter: Payer: Self-pay | Admitting: Gastroenterology

## 2021-05-04 ENCOUNTER — Telehealth: Payer: Self-pay

## 2021-05-04 NOTE — Progress Notes (Signed)
° ° °  Chronic Care Management Pharmacy Assistant   Name: RHONE OZAKI  MRN: [id number] DOB: 01/17/1966  Patient called to be reminded of his appointment with [name], [name] on 05/05/2021 @ 1300  No answer, left message of appointment date, time and type of appointment (either telephone or in person). Left message to have all medications, supplements, blood pressure and/or blood sugar logs available during appointment and to return call if need to reschedule.   Star Rating Drug: Losartan 100 mg last filled on 03/01/2021 for a 90-Day supply with [place] Rosuvastatin 40 mg last filled on 09/16/2020 for a 90-Day supply with [place] 10 mg patient on patient assistance with [name]  Any gaps in medications fill history?  Care Gaps: Losartan 100 mg last filled on 03/01/2021 for a 90-Day supply with [place] Rosuvastatin 40 mg last filled on 09/16/2020 for a 90-Day supply with [place] 10 mg patient on patient assistance with [name]   Lynann Bologna, CPA/CMA Clinical Pharmacist Assistant Phone: [phone]

## 2021-05-05 ENCOUNTER — Telehealth: Payer: Medicare HMO

## 2021-05-05 NOTE — Progress Notes (Deleted)
Chronic Care Management Pharmacy Note  05/05/2021 Name:  James Moreno MRN:  [id number] DOB:  [birth year]  Summary: Patient presents for CCM follow-up. He has been on vacation and has not been as strict on his diet. [name] PAP was approved, but he has not received the medication yet.   Recommendations/Changes made from today's visit: INCREASE Tresiba to 50 units daily in on week.  -Start PAP renewals for Mirian Mo, and [name]  Plan: [name] follow-up 3 months  Subjective: NATURE James Moreno is an 56 y.o. year old male who is a primary patient of Fisher, Kirstie Peri, MD.  The CCM team was consulted for assistance with disease management and care coordination needs.    Engaged with patient by telephone for follow up visit in response to provider referral for pharmacy case management and/or care coordination services.   Consent to Services:  The patient was given information about Chronic Care Management services, agreed to services, and gave verbal consent prior to initiation of services.  Please see initial visit note for detailed documentation.   Patient Care Team: Birdie Sons, MD as PCP - General (Family Medicine) Rockey Situ Kathlene November, MD as PCP - Cardiology (Cardiology) Ronnald Collum, Lourdes Sledge, MD as Attending Physician (Endocrinology) Pa, Bergholz (Optometry) Anthonette Legato, MD (Nephrology) Long, Thomes Cake, MD as Referring Physician (Vascular Surgery) Isaias Sakai, MD as Referring Physician (Ophthalmology) Lin Landsman, MD as Consulting Physician (Gastroenterology) Wellington Hampshire, MD as Consulting Physician (Cardiology) Germaine Pomfret, [facility] (Pharmacist) Garrel Ridgel, DPM as Consulting Physician (Podiatry)  Recent office visits: 06/20/20: Patient presented to Dr. Caryn Section for follow-up. Chantix stopped, patient started on Nicotine Patch 21 mg daily.  02/12/20: Video visit with Dr. Caryn Section for follow-up. Patient given Lantus 30 units daily due to  cost .   Recent consult visits: 11/01/20: Patient presented to Dr. Holley Raring (Nephrology). [name] 10 mg daily added. 10/21/20: Patient presented to Dr. Rockey Situ (Cardiology) for follow-up.  07/21/20: Patient presented to Dr. Holley Raring (Nephrology) for follow-up.  06/01/20: Patient presented to Dr. Ronnald Collum (Endocrinology) for initial visit.  05/12/20: Patient presented to Laurann Montana, NP for follow-up. Claudication stable.   Hospital visits: None in previous 6 months  Objective:  Lab Results  Component Value Date   CREATININE 1.72 (H) 06/20/2020   BUN 29 (H) 06/20/2020   GFRNONAA 39 (L) 09/17/2019   GFRAA 45 (L) 09/17/2019   NA 139 06/20/2020   K 4.2 06/20/2020   CALCIUM 10.7 (H) 06/20/2020   CO2 21 06/20/2020    Lab Results  Component Value Date/Time   HGBA1C 8.8 05/25/2020 12:00 AM   HGBA1C 9.7 (H) 09/17/2019 02:17 PM   HGBA1C 6.7 12/12/2018 12:00 AM   MICROALBUR 100 12/31/2016 02:13 PM    Last diabetic Eye exam:  Lab Results  Component Value Date/Time   HMDIABEYEEXA Retinopathy (A) 02/15/2020 12:00 AM    Last diabetic Foot exam: No results found for: HMDIABFOOTEX   Lab Results  Component Value Date   CHOL 141 06/20/2020   HDL 46 06/20/2020   LDLCALC 53 06/20/2020   TRIG 268 (H) 06/20/2020   CHOLHDL 3.1 06/20/2020    Hepatic Function Latest Ref Rng & Units 06/20/2020 09/17/2019 12/12/2018  Total Protein 6.0 - 8.5 g/dL - 6.5 -  Albumin 3.8 - 4.9 g/dL 4.2 4.3 -  AST 0 - 40 IU/L - 9 14  ALT 0 - 44 IU/L - 9 16  Alk Phosphatase 48 - 121 IU/L - 131(H) 95  Total  Bilirubin 0.0 - 1.2 mg/dL - 0.6 -    Lab Results  Component Value Date/Time   TSH 1.34 12/12/2018 12:00 AM   TSH 1.72 06/13/2006 12:00 AM    CBC Latest Ref Rng & Units 12/12/2018 10/21/2015 06/21/2013  WBC - 6.7 8.9 9.7  Hemoglobin 13.5 - 17.5 14.8 16.3 10.1(L)  Hematocrit 41 - 53 43 46.7 28.9(L)  Platelets 150 - 399 182 140(L) 175    No results found for: VD25OH  Clinical ASCVD: Yes  The 10-year ASCVD risk  score (Arnett DK, et al., 2019) is: 23.6%   Values used to calculate the score:     Age: 56 years     Sex: Male     Is Non-Hispanic African American: No     Diabetic: Yes     Tobacco smoker: Yes     Systolic Blood Pressure: 893 mmHg     Is BP treated: Yes     HDL Cholesterol: 46 mg/dL     Total Cholesterol: 141 mg/dL    Depression screen [facility] 2/9 02/27/2021 10/14/2020 01/19/2020  Decreased Interest 0 0 0  Down, Depressed, Hopeless 0 0 0  PHQ - 2 Score 0 0 0  Altered sleeping - 0 -  Tired, decreased energy - 0 -  Change in appetite - 0 -  Feeling bad or failure about yourself  - 0 -  Trouble concentrating - 0 -  Moving slowly or fidgety/restless - 0 -  Suicidal thoughts - 0 -  PHQ-9 Score - 0 -  Difficult doing work/chores - Not difficult at all -  Some recent data might be hidden      Social History   Tobacco Use  Smoking Status Every Day   Packs/day: 0.75   Years: 38.00   Pack years: 28.50   Types: Cigarettes  Smokeless Tobacco Never  Tobacco Comments   since age 56.   BP Readings from Last 3 Encounters:  03/30/21 (!) 155/89  10/21/20 140/60  10/14/20 (!) 164/76   Pulse Readings from Last 3 Encounters:  03/30/21 97  10/21/20 89  10/14/20 86   Wt Readings from Last 3 Encounters:  03/30/21 230 lb 2 oz (104.4 kg)  10/21/20 224 lb 2 oz (101.7 kg)  10/14/20 226 lb 12.8 oz (102.9 kg)    Assessment/Interventions: Review of patient past medical history, allergies, medications, health status, including review of consultants reports, laboratory and other test data, was performed as part of comprehensive evaluation and provision of chronic care management services.   SDOH:  (Social Determinants of Health) assessments and interventions performed: Yes   CCM Care Plan  Allergies  Allergen Reactions   No Known Allergies     Medications Reviewed Today     Reviewed by Shelby Mattocks, CMA (Certified Medical Assistant) on 03/30/21 at 59  Med List Status: <None>    Medication Order Taking? Sig Documenting Provider Last Dose Status Informant  alprazolam (XANAX) 2 MG tablet [id number] Yes TAKE 1 TABLET BY MOUTH THREE TIMES DAILY AS NEEDED FOR SLEEP Birdie Sons, MD Taking Active   amLODipine (NORVASC) 10 MG tablet [id number] Yes Take 10 mg by mouth daily.  [provider] Taking Active   amLODipine (NORVASC) 10 MG tablet [id number] Yes Take 1 tablet by mouth daily. [provider] Taking Active   B-D ULTRAFINE III SHORT PEN 31G X 8 MM MISC [id number] Yes  [provider] Taking Active   BD INSULIN SYRINGE U/F 31G X 5/16" 1 ML MISC  [id number] Yes  [provider] Taking Active   Blood Glucose Monitoring Suppl ([place]) DEVI [id number] Yes Frequency:ONCE   Dosage:0.0     Instructions:  Note:Dose: N/A [provider] Taking Active Self           Med Note Peggye Pitt T   Wed Oct 30, 2017 11:50 AM)    calcitRIOL (ROCALTROL) 0.25 MCG capsule [id number] Yes  [provider] Taking Active   carvedilol (COREG) 12.5 MG tablet [id number] Yes Take 12.5 mg by mouth 2 (two) times daily. [provider] Taking Active   cinacalcet (SENSIPAR) 30 MG tablet [id number] Yes Take 30 mg by mouth daily. [provider] Taking Active Self  dapagliflozin propanediol ([name]) 10 MG TABS tablet [id number] Yes Take 1 tablet (10 mg total) by mouth daily before breakfast. Patient receives through [name] through Dec 2023 Birdie Sons, MD Taking Active   EDEX 40 MCG injection [id number] Yes 40 mcg by Intracavitary route as needed.  [provider] Taking Active   fenofibrate (TRICOR) 145 MG tablet [id number] Yes  [provider] Taking Active   insulin aspart (NOVOLOG) 100 UNIT/ML injection [id number] Yes Inject 15 Units into the skin 3 (three) times daily before meals. [provider] Taking Active Self           Med Note Raeford Razor Aug 15, 2020  3:50 PM)  Frances Maywood through [name] Patient Assistance through Dec 2022   insulin degludec [facility]) 100 UNIT/ML FlexTouch Pen [id number] Yes Inject 45 Units into the skin daily. [provider] Taking Active            Med Note Raeford Razor Aug 15, 2020  3:50 PM) Frances Maywood through [name] Patient Assistance through Dec 2022  Lancets [facility] Donaciano Eva PLUS [id number]) [place] [id number] Yes  [provider] Taking Active   LANTUS SOLOSTAR 100 UNIT/ML Solostar Pen [id number] Yes  [provider] Taking Active   losartan (COZAAR) 100 MG tablet [id number] Yes Take 1 tablet (100 mg total) by mouth at bedtime. Rise Mu, PA-C Taking Active   mycophenolate (CELLCEPT) 500 MG tablet [id number] Yes Take 1,000 mg by mouth 2 (two) times daily. [provider] Taking Active   naloxone [facility]) nasal spray 4 mg/0.1 mL [id number] Yes Place 1 spray into the nose once.  [provider] Taking Active   nicotine (NICODERM CQ) 14 mg/24hr patch [id number] Yes Place 1 patch (14 mg total) onto the skin daily. Birdie Sons, MD Taking Active   NOVOLOG 100 UNIT/ML injection [id number] Yes  [provider] Taking Active   omeprazole (PRILOSEC) 20 MG capsule [id number] Yes TAKE 2 CAPSULES TWICE DAILY BEFORE A MEAL Vanga, Tally Due, MD Taking Active   omeprazole (PRILOSEC) 20 MG capsule [id number] Yes Take 2 capsules (40 mg total) by mouth 2 (two) times daily before a meal. Lin Landsman, MD Taking Active   [facility] VERIO test strip [id number] Yes  [provider] Taking Active   oxyCODONE (OXY IR/ROXICODONE) 5 MG immediate release tablet [id number] Yes Take 1-2 tablets (5-10 mg total) by mouth every 6 (six) hours as needed for severe pain. Birdie Sons, MD Taking Active   rosuvastatin (CRESTOR) 40 MG tablet [id number] Yes Take 1 tablet (40 mg total) by mouth daily. Minna Merritts, MD Taking Active   sildenafil (VIAGRA) 100 MG tablet  [id number] Yes TAKE ONE-HALF (1/2) TO ONE TABLET DAILY AS NEEDED  FOR ERECTILE DYSFUNCTION Birdie Sons, MD Taking Active   tacrolimus (PROGRAF) 1 MG capsule [id number] Yes Take 3 mg by mouth 2 (two) times daily.  [provider] Taking Active Self           Med Note Kenton Kingfisher, Germaine Pomfret Oct 30, 2017 11:51 AM)    tretinoin (RETIN-A) 0.05 % cream [id number] Yes Apply topically as needed.  [provider] Taking Active   triamcinolone cream (KENALOG) 0.1 % [id number] Yes APPLY DAILY TO INFLAMED BUMPS AS NEEDED [provider] Taking Active   XYOSTED 100 MG/0.5ML SOAJ [id number] Yes 0.5 mLs once a week. Every sunday [provider] Taking Active             Patient Active Problem List   Diagnosis Date Noted   Chronic pain following surgery or procedure 11/15/2020   Proliferative retinopathy of left eye due to diabetes mellitus ([place]) 01/28/2019   Esophageal dysphagia    Benign essential hypertension 12/11/2018   Secondary hyperparathyroidism of renal origin ([place]) 12/11/2018   Severe tobacco use disorder 08/01/2018   Atherosclerosis of artery of extremity with ulceration ([place]) 07/23/2018   Gastroesophageal reflux disease 05/26/2018   PAD (peripheral artery disease) ([place]) 10/30/2017   Chronic ulcer of heel, right, with unspecified severity ([place]) 10/16/2017   Hepatitis B core antibody positive 07/03/2017   Hypertriglyceridemia 07/03/2017   Chronic, continuous use of opioids 03/28/2016   Anxiety 03/28/2016   Pain in surgical scar 11/07/2015   Bulging eyes 01/24/2015   Leg mass 01/24/2015   Renal transplant, status post 01/24/2015   Carotid arterial disease ([place]) 12/27/2014   Compulsive tobacco user syndrome 12/27/2014   Abnormal EKG 04/06/2013   Erectile dysfunction 11/29/2012   Obesity 11/28/2012   Type 2 diabetes mellitus with diabetic nephropathy ([place]) 11/28/2012   Hypertension 12/21/2011   Hyperlipidemia 12/21/2011   Exposure to Mycobacterium  tuberculosis 10/30/2011   Obstructive apnea 01/09/2011   History of other malignant neoplasm of skin 07/16/2006   Glaucoma 01/13/2006   Episodic paroxysmal anxiety disorder 03/26/1998    Immunization History  Administered Date(s) Administered   Influenza Split 12/05/2010   Influenza,inj,Quad PF,6+ Mos 12/31/2016, 01/27/2019, 01/02/2021   Influenza-Unspecified 12/26/2020   PFIZER(Purple Top)SARS-COV-2 Vaccination 06/18/2019, 07/09/2019, 02/22/2020, 01/31/2021   Pneumococcal Polysaccharide-23 10/24/2010   Tdap 12/05/2010    Conditions to be addressed/monitored:  Hypertension, Hyperlipidemia, Diabetes, Coronary Artery Disease, GERD, Anxiety, Tobacco use and History of Renal Transplant   There are no care plans that you recently modified to display for this patient.   Medication Assistance:  Mirian Mo obtained through [name] medication assistance program.  Enrollment ends Dec 2022 [name] PAP in progress. Anticipated start date TBD.   Patient's preferred pharmacy is:  [facility] [address] [place] [address] [place] [address] Phone: [phone] Fax: [phone]  [facility] ([facility] Bundle Only) - [place], [address] [place] [address] Phone: [phone] Fax: [place], [place] [address] [place] [address] Phone: [phone] Fax: [phone]  [place], [place] [address] Phone: [phone] Fax: [phone]  [place] [address]), [place] - [place]) [place] [address] Phone: [phone] Fax: [address], [place]  [place] [address] Phone: [phone] Fax: [phone]   Uses pill box? Yes Pt  endorses 100% compliance  We discussed: Current pharmacy is preferred with insurance plan and patient is satisfied with pharmacy services Patient decided to: Continue current medication management strategy  Care Plan and Follow Up Patient Decision:  Patient agrees to Care Plan and Follow-up.  Plan: Telephone follow up appointment with care management team member scheduled for:  05/05/2021 at 1:00 PM  [facility] [phone]  Current Barriers:  Unable to independently afford treatment regimen Unable to achieve control of Diabetes   Pharmacist Clinical Goal(s):  Over the next 90 days, patient will verbalize ability to afford treatment regimen achieve control of Diabetes as evidenced by A1c less than 7% through collaboration with PharmD and provider.   Interventions: 1:1 collaboration with Birdie Sons, MD regarding development and update of comprehensive plan of care as evidenced by provider attestation and co-signature Inter-disciplinary care team collaboration (see longitudinal plan of care) Comprehensive medication review performed; medication list updated in electronic medical record  Hypertension (BP goal <130/80) -Controlled -Current treatment: Amlodipine 10 mg daily  Carvedilol 6.25 mg twice daily  Losartan 100 mg daily  -Medications previously tried: NA  -Current home readings: 120-140/70-80  -Denies hypotensive/hypertensive symptoms -Educated on Daily salt intake goal < 2300 mg; Importance of home blood pressure monitoring; -Counseled to monitor BP at home 2-3 times weekly, document, and provide log at future appointments -Recommended to continue current medication  Hyperlipidemia: (LDL goal < 70) -History of PAD, CAD  -Controlled -Current treatment: Rosuvastatin 40 mg daily  -Medications previously tried: NA  -Educated on Importance of limiting foods high in cholesterol; -Recommended to continue current  medication  Diabetes (A1c goal <8%) -Uncontrolled -Current medications: [name] 10 mg daily (not started yet) Novolog 15 units three times daily + 2 units for every 50 units above 150  Tresiba 45 units daily (0.44 u/kg) -Medications previously tried: Lantus (Formulary)  -Current home glucose readings  Target 10/26-11/8 1/28-2/10  Number of days worn ? 14 days 14 14  % of time active ? 70% 80% 50%  Mean Glucose (mg/dL)  262 182  GMI (2-week A1c estimate) = 3.31 + 0.02392 x [mean glucose in mg/dL]  9.6% 7.7%  Glycemic Variability (%CV) ?36% 38.8% 46.2%  Time above >250 mg/dL <5% 49% 20%  Time above 70-180 mg/dL <25% 26% 26%  Time in range: 70-180 mg/dL >70% 25% 50%  Time below 70 mg/dL <4% 0% 4%  Time below 54 mg/dL <1% 0%   -Dietary Patterns:  -Exericse Patterns: Plans to restart weekly  -Recommended to continue current medication  Depression/Anxiety (Goal: Maintain stable mood and sleep) -Controlled -Current treatment: Alprazolam 2 mg three times daily as needed - Sleep  -Medications previously tried/failed: NA -PHQ9: 0 -GAD7: 0 -Educated on Benefits of medication for symptom control Benefits of cognitive-behavioral therapy with or without medication -Recommended to continue current medication   Renal Transplant  (Goal: prevent rejection of kidney ) -Managed by Dr. Holley Raring  -Controlled -Current treatment  Mycophenolate 500 mg 2 tablets twice daily  Tacrolimus 1 mg 3 capsules twice daily  -Medications previously tried: Myfortic (cost) -Drug interactions evaluated, no significant drug interactions noted with current regimen.  -Recommended to continue current medication  Allergic Rhinitis (Goal: Minimize symptoms) -Not ideally controlled -Current treatment  None -Medications previously tried: Flonase (ineffective), Zyrtec (ineffective) -Symptoms significantly worse. Patient reports itchy eyes, congestion.  -Recommended claritin  10 mg daily  Chronic Kidney Disease  Stage 3a  -All medications assessed for renal dosing and appropriateness in chronic kidney disease. -Recommended to continue current medication  Patient Goals/Self-Care Activities Over the next 90 days, patient will:  - check glucose 2-3 times daily , document, and provide at future appointments -check blood pressure 2-3 times weekly , document, and provide at future appointments -decrease cigarette use   Follow Up Plan: ***

## 2021-05-19 ENCOUNTER — Telehealth: Payer: Self-pay

## 2021-05-19 ENCOUNTER — Ambulatory Visit (INDEPENDENT_AMBULATORY_CARE_PROVIDER_SITE_OTHER): Payer: Medicare HMO

## 2021-05-19 DIAGNOSIS — E1121 Type 2 diabetes mellitus with diabetic nephropathy: Secondary | ICD-10-CM

## 2021-05-19 DIAGNOSIS — I10 Essential (primary) hypertension: Secondary | ICD-10-CM

## 2021-05-19 NOTE — Progress Notes (Signed)
Chronic Care Management Pharmacy Assistant   Name: James Moreno  MRN: [id number] DOB: 07-04-1965  Patient Assistance for [place]  I received a message from the [name] today advising that the patient still has not received his Ozempic. The last representative a spoke with advised it could take up to 30-Days in which the 30-Days are not up. I will contact [employer] to see if they can provide the patient with a 30-Day voucher until his shipment arrives.  I contacted [name] and want able to speak with anyone. I will try back on Monday.  05/22/2021  I contacted [name] and spoke with Seth Bake and she advised that the patient's medication has been processing for shipment since 04/30/2021 and due to delays it can take up to 30 business days for shipping. I did request to speak to someone in the voucher department and after a hold she advised that their wait time was very long and she would put in a request to have someone call me back regarding a voucher for the patient.   I tried to contact the patient, but his phone went straight to voicemail. I was unable to leave a message at this time as the patient's voicemail was full.  05/23/2021  I contacted [employer] back to see if I could be transferred over to their voucher department to request a voucher for the patient. I spoke with Elmo Putt who was able to provide me with 30-Day voucher for the patient's Ozempic. She advised she would e-mail the voucher information to my e-mail address. [name] has been notified and will send in a 30-Day prescription for the patient to his local [facility] on file per patient's request. Once the prescription has been sent to [facility] I will contact them and provide them with the voucher information so patient can start on this medication.  I reached out to the patient's [place] and provided them with the  Voucher Information: [id number] PCN: [id number] GRP NUM: [id number] MEM [id number]   The  pharmacist was able to get everything inputted into their system, and confirmed everything went through with no issues and the patient can pick-up his prescription at no charge to him.  Patient notified.   Medications: Outpatient Encounter Medications as of 05/19/2021  Medication Sig Note   alprazolam (XANAX) 2 MG tablet Take 1 tablet (2 mg total) by mouth 3 (three) times daily as needed for sleep.    amLODipine (NORVASC) 10 MG tablet Take 10 mg by mouth daily.     amLODipine (NORVASC) 10 MG tablet Take 1 tablet (10 mg total) by mouth daily.    B-D ULTRAFINE III SHORT PEN 31G X 8 MM MISC     BD INSULIN SYRINGE U/F 31G X 5/16" 1 ML MISC     Blood Glucose Monitoring Suppl (GLUCOCOM BLOOD GLUCOSE MONITOR) DEVI Frequency:ONCE   Dosage:0.0     Instructions:  Note:Dose: N/A    calcitRIOL (ROCALTROL) 0.25 MCG capsule Take 1 capsule (0.25 mcg total) by mouth daily.    carvedilol (COREG) 12.5 MG tablet Take 12.5 mg by mouth 2 (two) times daily.    cinacalcet (SENSIPAR) 30 MG tablet Take 1 tablet (30 mg total) by mouth daily.    dapagliflozin propanediol ([name]) 10 MG TABS tablet Take 1 tablet (10 mg total) by mouth daily before breakfast. Patient receives through [name] through Dec 2023    EDEX 40 MCG injection 40 mcg by Intracavitary route as needed.     fenofibrate (TRICOR)  145 MG tablet     insulin aspart (NOVOLOG) 100 UNIT/ML injection Inject 15 Units into the skin 3 (three) times daily before meals. 5/23/2022Frances Maywood through [name] Patient Assistance through Dec 2022    insulin degludec (TRESIBA FLEXTOUCH) 100 UNIT/ML FlexTouch Pen Inject 45 Units into the skin daily. 5/23/2022Frances Maywood through [name] Patient Assistance through Dec 2022   Lancets (ONETOUCH DELICA PLUS [id number]) MISC     LANTUS SOLOSTAR 100 UNIT/ML Solostar Pen     losartan (COZAAR) 100 MG tablet Take 1 tablet (100 mg total) by mouth at bedtime.    naloxone (NARCAN) nasal spray 4 mg/0.1 mL Place 1 spray into the  nose once.     nicotine (NICODERM CQ) 14 mg/24hr patch Place 1 patch (14 mg total) onto the skin daily.    NOVOLOG 100 UNIT/ML injection     omeprazole (PRILOSEC) 20 MG capsule TAKE 2 CAPSULES TWICE DAILY BEFORE A MEAL    omeprazole (PRILOSEC) 20 MG capsule Take 2 capsules (40 mg total) by mouth 2 (two) times daily before a meal.    ONETOUCH VERIO test strip     oxyCODONE (OXY IR/ROXICODONE) 5 MG immediate release tablet Take 1-2 tablets (5-10 mg total) by mouth every 6 (six) hours as needed for severe pain.    rosuvastatin (CRESTOR) 40 MG tablet Take 1 tablet (40 mg total) by mouth daily.    sildenafil (VIAGRA) 100 MG tablet TAKE ONE-HALF (1/2) TO ONE TABLET DAILY AS NEEDED FOR ERECTILE DYSFUNCTION    tacrolimus (PROGRAF) 1 MG capsule Take 3 mg by mouth 2 (two) times daily.     tretinoin (RETIN-A) 0.05 % cream Apply topically as needed.     triamcinolone cream (KENALOG) 0.1 % APPLY DAILY TO INFLAMED BUMPS AS NEEDED    XYOSTED 100 MG/0.5ML SOAJ 0.5 mLs once a week. Every sunday    No facility-administered encounter medications on file as of 05/19/2021.    Lynann Bologna, CPA/CMA Clinical Pharmacist Assistant Phone: [phone]

## 2021-05-19 NOTE — Progress Notes (Signed)
Chronic Care Management Pharmacy Note  05/29/2021 Name:  James Moreno MRN:  [id number] DOB:  1966/02/05  Summary: Patient presents for CCM follow-up. He has been following an intermittent fasting diet, patient continues to have high variability in blood sugar readings at all times of the day.  -Patient has yet to receive Ozempic via patient assistance. Was able to co-ordinate getting patient voucher to get medication from pharmacy.   Recommendations/Changes made from today's visit: -START Ozempic 0.25 mg weekly for four weeks, then increase to 0.5 mg weekly.   Plan: [name] follow-up 3 months  Subjective: James Moreno is an 56 y.o. year old male who is a primary patient of Fisher, Kirstie Peri, MD.  The CCM team was consulted for assistance with disease management and care coordination needs.    Engaged with patient by telephone for follow up visit in response to provider referral for pharmacy case management and/or care coordination services.   Consent to Services:  The patient was given information about Chronic Care Management services, agreed to services, and gave verbal consent prior to initiation of services.  Please see initial visit note for detailed documentation.   Patient Care Team: Birdie Sons, MD as PCP - General (Family Medicine) Rockey Situ Kathlene November, MD as PCP - Cardiology (Cardiology) Ronnald Collum, Lourdes Sledge, MD as Attending Physician (Endocrinology) Pa, [place] (Optometry) Anthonette Legato, MD (Nephrology) Long, Thomes Cake, MD as Referring Physician (Vascular Surgery) Isaias Sakai, MD as Referring Physician (Ophthalmology) Lin Landsman, MD as Consulting Physician (Gastroenterology) Wellington Hampshire, MD as Consulting Physician (Cardiology) Germaine Pomfret, [facility] (Pharmacist) Garrel Ridgel, DPM as Consulting Physician (Podiatry)  Recent office visits: 06/20/20: Patient presented to Dr. Caryn Section for follow-up. Chantix stopped, patient started on  Nicotine Patch 21 mg daily.  02/12/20: Video visit with Dr. Caryn Section for follow-up. Patient given Lantus 30 units daily due to cost .   Recent consult visits: 11/01/20: Patient presented to Dr. Holley Raring (Nephrology). [name] 10 mg daily added. 10/21/20: Patient presented to Dr. Rockey Situ (Cardiology) for follow-up.  07/21/20: Patient presented to Dr. Holley Raring (Nephrology) for follow-up.  06/01/20: Patient presented to Dr. Ronnald Collum (Endocrinology) for initial visit.  05/12/20: Patient presented to Laurann Montana, NP for follow-up. Claudication stable.   Hospital visits: None in previous 6 months  Objective:  Lab Results  Component Value Date   CREATININE 1.72 (H) 06/20/2020   BUN 29 (H) 06/20/2020   GFRNONAA 39 (L) 09/17/2019   GFRAA 45 (L) 09/17/2019   NA 139 06/20/2020   K 4.2 06/20/2020   CALCIUM 10.7 (H) 06/20/2020   CO2 21 06/20/2020    Lab Results  Component Value Date/Time   HGBA1C 8.8 05/25/2020 12:00 AM   HGBA1C 9.7 (H) 09/17/2019 02:17 PM   HGBA1C 6.7 12/12/2018 12:00 AM   MICROALBUR 100 12/31/2016 02:13 PM    Last diabetic Eye exam:  Lab Results  Component Value Date/Time   HMDIABEYEEXA Retinopathy (A) 02/15/2020 12:00 AM    Last diabetic Foot exam: No results found for: HMDIABFOOTEX   Lab Results  Component Value Date   CHOL 141 06/20/2020   HDL 46 06/20/2020   LDLCALC 53 06/20/2020   TRIG 268 (H) 06/20/2020   CHOLHDL 3.1 06/20/2020    Hepatic Function Latest Ref Rng & Units 06/20/2020 09/17/2019 12/12/2018  Total Protein 6.0 - 8.5 g/dL - 6.5 -  Albumin 3.8 - 4.9 g/dL 4.2 4.3 -  AST 0 - 40 IU/L - 9 14  ALT 0 -  44 IU/L - 9 16  °Alk Phosphatase 48 - 121 IU/L - 131(H) 95  °Total Bilirubin 0.0 - 1.2 mg/dL - 0.6 -  ° ° °Lab Results  °Component Value Date/Time  ° TSH 1.34 12/12/2018 12:00 AM  ° TSH 1.72 06/13/2006 12:00 AM  ° ° °CBC Latest Ref Rng & Units 12/12/2018 10/21/2015 06/21/2013  °WBC - 6.7 8.9 9.7  °Hemoglobin 13.5 - 17.5 14.8 16.3 10.1(L)  °Hematocrit 41 - 53 43 46.7  28.9(L)  °Platelets 150 - 399 182 140(L) 175  ° ° °No results found for: VD25OH ° °Clinical ASCVD: Yes  °The 10-year ASCVD risk score (Arnett DK, et al., 2019) is: 23.6% °  Values used to calculate the score: °    Age: 55 years °    Sex: Male °    Is Non-Hispanic African American: No °    Diabetic: Yes °    Tobacco smoker: Yes °    Systolic Blood Pressure: 155 mmHg °    Is BP treated: Yes °    HDL Cholesterol: 46 mg/dL °    Total Cholesterol: 141 mg/dL   ° °Depression screen PHQ 2/9 02/27/2021 10/14/2020 01/19/2020  °Decreased Interest 0 0 0  °Down, Depressed, Hopeless 0 0 0  °PHQ - 2 Score 0 0 0  °Altered sleeping - 0 -  °Tired, decreased energy - 0 -  °Change in appetite - 0 -  °Feeling bad or failure about yourself  - 0 -  °Trouble concentrating - 0 -  °Moving slowly or fidgety/restless - 0 -  °Suicidal thoughts - 0 -  °PHQ-9 Score - 0 -  °Difficult doing work/chores - Not difficult at all -  °Some recent data might be hidden  °  ° ° °Social History  ° °Tobacco Use  °Smoking Status Every Day  ° Packs/day: 0.75  ° Years: 38.00  ° Pack years: 28.50  ° Types: Cigarettes  °Smokeless Tobacco Never  °Tobacco Comments  ° since age 14.  ° °BP Readings from Last 3 Encounters:  °03/30/21 (!) 155/89  °10/21/20 140/60  °10/14/20 (!) 164/76  ° °Pulse Readings from Last 3 Encounters:  °03/30/21 97  °10/21/20 89  °10/14/20 86  ° °Wt Readings from Last 3 Encounters:  °03/30/21 230 lb 2 oz (104.4 kg)  °10/21/20 224 lb 2 oz (101.7 kg)  °10/14/20 226 lb 12.8 oz (102.9 kg)  ° ° °Assessment/Interventions: Review of patient past medical history, allergies, medications, health status, including review of consultants reports, laboratory and other test data, was performed as part of comprehensive evaluation and provision of chronic care management services.  ° °SDOH:  (Social Determinants of Health) assessments and interventions performed: Yes ° ° °CCM Care Plan ° °Allergies  °Allergen Reactions  ° No Known Allergies   ° ° °Medications  Reviewed Today   ° ° Reviewed by Hobbs, Ashley L, CMA (Certified Medical Assistant) on 03/30/21 at 1426  Med List Status: <None>  ° °Medication Order Taking? Sig Documenting Provider Last Dose Status Informant  °alprazolam (XANAX) 2 MG tablet [id number] Yes TAKE 1 TABLET BY MOUTH THREE TIMES DAILY AS NEEDED FOR SLEEP Fisher, Donald E, MD Taking Active   °amLODipine (NORVASC) 10 MG tablet [id number] Yes Take 10 mg by mouth daily.  [provider] Taking Active   °amLODipine (NORVASC) 10 MG tablet [id number] Yes Take 1 tablet by mouth daily. [provider] Taking Active   °B-D ULTRAFINE III SHORT PEN 31G X 8 MM MISC [id number] Yes    [provider] Taking Active   °BD INSULIN SYRINGE U/F 31G X 5/16" 1 ML MISC [id number] Yes  [provider] Taking Active   °Blood Glucose Monitoring Suppl (GLUCOCOM BLOOD GLUCOSE MONITOR) DEVI [id number] Yes Frequency:ONCE   Dosage:0.0     Instructions:  Note:Dose: N/A [provider] Taking Active Self  °         °Med Note (HARRIS, YOLINDA T   Wed Oct 30, 2017 11:50 AM)    °calcitRIOL (ROCALTROL) 0.25 MCG capsule [id number] Yes  [provider] Taking Active   °carvedilol (COREG) 12.5 MG tablet [id number] Yes Take 12.5 mg by mouth 2 (two) times daily. [provider] Taking Active   °cinacalcet (SENSIPAR) 30 MG tablet [id number] Yes Take 30 mg by mouth daily. [provider] Taking Active Self  °dapagliflozin propanediol ([name]) 10 MG TABS tablet [id number] Yes Take 1 tablet (10 mg total) by mouth daily before breakfast. Patient receives through [name] through Dec 2023 Fisher, Donald E, MD Taking Active   °EDEX 40 MCG injection [id number] Yes 40 mcg by Intracavitary route as needed.  [provider] Taking Active   °fenofibrate (TRICOR) 145 MG tablet [id number] Yes  [provider] Taking Active   °insulin aspart (NOVOLOG) 100 UNIT/ML injection [id number] Yes Inject 15 Units into the skin 3 (three) times daily  before meals. [provider] Taking Active Self  °         °Med Note (FLEURY, ALEXANDRE A   Mon Aug 15, 2020  3:50 PM) Receives through [employer] Patient Assistance through Dec 2022 °  °insulin degludec (TRESIBA FLEXTOUCH) 100 UNIT/ML FlexTouch Pen [id number] Yes Inject 45 Units into the skin daily. [provider] Taking Active   °         °Med Note (FLEURY, ALEXANDRE A   Mon Aug 15, 2020  3:50 PM) Receives through [employer] Patient Assistance through Dec 2022  °Lancets (ONETOUCH DELICA PLUS LANCET30G) MISC [id number] Yes  [provider] Taking Active   °LANTUS SOLOSTAR 100 UNIT/ML Solostar Pen [id number] Yes  [provider] Taking Active   °losartan (COZAAR) 100 MG tablet [id number] Yes Take 1 tablet (100 mg total) by mouth at bedtime. Dunn, Ryan M, PA-C Taking Active   °mycophenolate (CELLCEPT) 500 MG tablet [id number] Yes Take 1,000 mg by mouth 2 (two) times daily. [provider] Taking Active   °naloxone (NARCAN) nasal spray 4 mg/0.1 mL [id number] Yes Place 1 spray into the nose once.  [provider] Taking Active   °nicotine (NICODERM CQ) 14 mg/24hr patch [id number] Yes Place 1 patch (14 mg total) onto the skin daily. Fisher, Donald E, MD Taking Active   °NOVOLOG 100 UNIT/ML injection [id number] Yes  [provider] Taking Active   °omeprazole (PRILOSEC) 20 MG capsule [id number] Yes TAKE 2 CAPSULES TWICE DAILY BEFORE A MEAL Vanga, Rohini Reddy, MD Taking Active   °omeprazole (PRILOSEC) 20 MG capsule [id number] Yes Take 2 capsules (40 mg total) by mouth 2 (two) times daily before a meal. Vanga, Rohini Reddy, MD Taking Active   °ONETOUCH VERIO test strip [id number] Yes  [provider] Taking Active   °oxyCODONE (OXY IR/ROXICODONE) 5 MG immediate release tablet [id number] Yes Take 1-2 tablets (5-10 mg total) by mouth every 6 (six) hours as needed for severe pain. Fisher, Donald E, MD Taking Active   °rosuvastatin (CRESTOR) 40 MG tablet  [id number] Yes Take 1 tablet (40 mg total) by mouth daily. Gollan, Timothy J, MD Taking Active   °  sildenafil (VIAGRA) 100 MG tablet [id number] Yes TAKE ONE-HALF (1/2) TO ONE TABLET DAILY AS NEEDED FOR ERECTILE DYSFUNCTION Fisher, Donald E, MD Taking Active   °tacrolimus (PROGRAF) 1 MG capsule [id number] Yes Take 3 mg by mouth 2 (two) times daily.  [provider] Taking Active Self  °         °Med Note (HARRIS, YOLINDA T   Wed Oct 30, 2017 11:51 AM)    °tretinoin (RETIN-A) 0.05 % cream [id number] Yes Apply topically as needed.  [provider] Taking Active   °triamcinolone cream (KENALOG) 0.1 % [id number] Yes APPLY DAILY TO INFLAMED BUMPS AS NEEDED [provider] Taking Active   °XYOSTED 100 MG/0.5ML SOAJ [id number] Yes 0.5 mLs once a week. Every sunday [provider] Taking Active   ° °  °  ° °  ° ° °Patient Active Problem List  ° Diagnosis Date Noted  ° Chronic pain following surgery or procedure 11/15/2020  ° Proliferative retinopathy of left eye due to diabetes mellitus (HCC) 01/28/2019  ° Esophageal dysphagia   ° Benign essential hypertension 12/11/2018  ° Secondary hyperparathyroidism of renal origin (HCC) 12/11/2018  ° Severe tobacco use disorder 08/01/2018  ° Atherosclerosis of artery of extremity with ulceration (HCC) 07/23/2018  ° Gastroesophageal reflux disease 05/26/2018  ° PAD (peripheral artery disease) (HCC) 10/30/2017  ° Chronic ulcer of heel, right, with unspecified severity (HCC) 10/16/2017  ° Hepatitis B core antibody positive 07/03/2017  ° Hypertriglyceridemia 07/03/2017  ° Chronic, continuous use of opioids 03/28/2016  ° Anxiety 03/28/2016  ° Pain in surgical scar 11/07/2015  ° Bulging eyes 01/24/2015  ° Leg mass 01/24/2015  ° Renal transplant, status post 01/24/2015  ° Carotid arterial disease (HCC) 12/27/2014  ° Compulsive tobacco user syndrome 12/27/2014  ° Abnormal EKG 04/06/2013  ° Erectile dysfunction 11/29/2012  ° Obesity 11/28/2012  ° Type 2 diabetes  mellitus with diabetic nephropathy (HCC) 11/28/2012  ° Hypertension 12/21/2011  ° Hyperlipidemia 12/21/2011  ° 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 10/30/2011  ° Obstructive apnea 01/09/2011  ° History of other malignant neoplasm of skin 07/16/2006  ° Glaucoma 01/13/2006  ° Episodic paroxysmal anxiety disorder 03/26/1998  ° ° °Immunization History  °Administered Date(s) Administered  ° Influenza Split 12/05/2010  ° Influenza,inj,Quad PF,6+ Mos 12/31/2016, 01/27/2019, 01/02/2021  ° Influenza-Unspecified 12/26/2020  ° PFIZER(Purple Top)SARS-COV-2 Vaccination 06/18/2019, 07/09/2019, 02/22/2020, 01/31/2021  ° Pneumococcal Polysaccharide-23 10/24/2010  ° Tdap 12/05/2010  ° ° °Conditions to be addressed/monitored:  °Hypertension, Hyperlipidemia, Diabetes, Coronary Artery Disease, GERD, Anxiety, Tobacco use and History of Renal Transplant  ° °Care Plan : General Pharmacy (Adult)  °Updates made by Fleury, Alexandre A, RPH since 05/29/2021 12:00 AM  °  ° °Problem: Hypertension, Hyperlipidemia, Diabetes, Coronary Artery Disease, GERD, Anxiety, Tobacco use and History of Renal Transplant   °Priority: High  °  ° °Long-Range Goal: Patient-Specific Goal   °Start Date: 05/19/2020  °Expected End Date: 12/12/2021  °This Visit's Progress: On track  °Recent Progress: On track  °Priority: High  °Note:   °Current Barriers:  °Unable to independently afford treatment regimen °Unable to achieve control of Diabetes  ° °Pharmacist Clinical Goal(s):  °Over the next 90 days, patient will verbalize ability to afford treatment regimen °achieve control of Diabetes as evidenced by A1c less than 7% through collaboration with PharmD and provider.  ° °Interventions: °1:1 collaboration with Fisher, Donald E, MD regarding development and update of comprehensive plan of care as evidenced by provider attestation and co-signature °Inter-disciplinary care team collaboration (see longitudinal   plan of care) Comprehensive medication review performed;  medication list updated in electronic medical record  Hypertension (BP goal <130/80) -Controlled -Current treatment: Amlodipine 10 mg daily  Carvedilol 6.25 mg twice daily  Losartan 100 mg daily  -Medications previously tried: NA  -Current home readings: 120-140/70-80  -Denies hypotensive/hypertensive symptoms -Educated on Daily salt intake goal < 2300 mg; Importance of home blood pressure monitoring; -Counseled to monitor BP at home 2-3 times weekly, document, and provide log at future appointments -Recommended to continue current medication  Hyperlipidemia: (LDL goal < 70) -History of PAD, CAD  -Controlled -Current treatment: Rosuvastatin 40 mg daily  -Medications previously tried: NA  -Educated on Importance of limiting foods high in cholesterol; -Recommended to continue current medication  Diabetes (A1c goal <8%) -Uncontrolled -Current medications: [name] 10 mg daily: Appropriate, Query effective Novolog 15 units three times daily + 2 units for every 50 units above 150: Appropriate, Query effective  Tresiba 50 units daily (0.44 u/kg): Appropriate, Query effective -Medications previously tried: Lantus (Formulary)  -Current home glucose readings  Target 10/26-11/8 1/28-2/10 2/11-2/24  Number of days worn ? 14 days _0 % of time active ? 70% 80% 50% 76%  Mean Glucose (mg/dL)  262 182 207  GMI (2-week A1c estimate) = 3.31 + 0.02392 x [mean glucose in mg/dL]  9.6% 7.7% 8.3%  Glycemic Variability (%CV) ?36% 38.8% 46.2% 48.4%  Time above >250 mg/dL <5% 49% 20% 35%  Time above 70-180 mg/dL <25% 26% 26% 15%  Time in range: 70-180 mg/dL >70% 25% 50% 47%  Time below 70 mg/dL <4% 0% 4% 3%  Time below 54 mg/dL <1% 0%    -Dietary Patterns: Intermittent fasting.  -Exericse Patterns: Plans to restart weekly  -Patient has yet to receive Ozempic via patient assistance. Was able to co-ordinate getting patient voucher to get medication from pharmacy.  -START Ozempic 0.25 mg  weekly for four weeks, then increase to 0.5 mg weekly.  -Recommended to continue current medication  Depression/Anxiety (Goal: Maintain stable mood and sleep) -Controlled -Current treatment: Alprazolam 2 mg three times daily as needed - Sleep  -Medications previously tried/failed: NA -PHQ9: 0 -GAD7: 0 -Educated on Benefits of medication for symptom control Benefits of cognitive-behavioral therapy with or without medication -Recommended to continue current medication   Renal Transplant  (Goal: prevent rejection of kidney ) -Managed by Dr. Holley Raring  -Controlled -Current treatment  Mycophenolate 500 mg 2 tablets twice daily  Tacrolimus 1 mg 3 capsules twice daily  -Medications previously tried: Myfortic (cost) -Drug interactions evaluated, no significant drug interactions noted with current regimen.  -Recommended to continue current medication  Allergic Rhinitis (Goal: Minimize symptoms) -Not ideally controlled -Current treatment  None -Medications previously tried: Flonase (ineffective), Zyrtec (ineffective) -Symptoms significantly worse. Patient reports itchy eyes, congestion.  -Recommended claritin 10 mg daily  Chronic Kidney Disease Stage 3a  -All medications assessed for renal dosing and appropriateness in chronic kidney disease. -Recommended to continue current medication  Patient Goals/Self-Care Activities Over the next 90 days, patient will:  - check glucose 2-3 times daily , document, and provide at future appointments -check blood pressure 2-3 times weekly , document, and provide at future appointments -decrease cigarette use   Follow Up Plan: Telephone follow up appointment with care management team member scheduled for:  06/13/2021 at 3:45 PM     Medication Assistance:  Mirian Mo obtained through [name] medication assistance program.  Enrollment ends Dec 2022 [name] PAP in progress. Anticipated start date TBD.  Patient's preferred pharmacy  is:  [facility] [address], [place] [address] [place] [address] Phone: [phone] Fax: [phone]  [facility] (ESRD Bundle Only) - [place], [address] [place] [address] Phone: [phone] Fax: [place], [place] [place] [address] Phone: [phone] Fax: [phone]  [place], [place] [address] Phone: [phone] Fax: [phone]  [place] [address]), [place] - [place]) [place] [address] Phone: [phone] Fax: [place], [place]. [place] [address] Phone: [phone] Fax: [phone]   Uses pill box? Yes Pt endorses 100% compliance  We discussed: Current pharmacy is preferred with insurance plan and patient is satisfied with pharmacy services Patient decided to: Continue current medication management strategy  Care Plan and Follow Up Patient Decision:  Patient agrees to Care Plan and Follow-up.  Plan: Telephone follow up appointment with care management team member scheduled for:  06/13/2021 at 3:45 PM  [place] [phone]

## 2021-05-23 DIAGNOSIS — I10 Essential (primary) hypertension: Secondary | ICD-10-CM

## 2021-05-23 DIAGNOSIS — E1121 Type 2 diabetes mellitus with diabetic nephropathy: Secondary | ICD-10-CM | POA: Diagnosis not present

## 2021-05-23 MED ORDER — OZEMPIC (0.25 OR 0.5 MG/DOSE) 2 MG/1.5ML ~~LOC~~ SOPN: PEN_INJECTOR | SUBCUTANEOUS | 0 refills | Status: DC

## 2021-05-26 DIAGNOSIS — H524 Presbyopia: Secondary | ICD-10-CM | POA: Diagnosis not present

## 2021-05-26 DIAGNOSIS — E113293 Type 2 diabetes mellitus with mild nonproliferative diabetic retinopathy without macular edema, bilateral: Secondary | ICD-10-CM | POA: Diagnosis not present

## 2021-05-26 LAB — HM DIABETES EYE EXAM

## 2021-05-29 ENCOUNTER — Other Ambulatory Visit: Payer: Self-pay | Admitting: Family Medicine

## 2021-05-29 DIAGNOSIS — M79604 Pain in right leg: Secondary | ICD-10-CM

## 2021-05-29 DIAGNOSIS — R52 Pain, unspecified: Secondary | ICD-10-CM

## 2021-05-29 DIAGNOSIS — L905 Scar conditions and fibrosis of skin: Secondary | ICD-10-CM

## 2021-05-29 NOTE — Patient Instructions (Signed)
Visit Information It was great speaking with you today!  Please let me know if you have any questions about our visit.   Goals Addressed             This Visit's Progress    Monitor and Manage My Blood Sugar-Diabetes Type 2   On track    Timeframe:  Long-Range Goal Priority:  High Start Date: 05/17/2020                            Expected End Date: 11/16/2021                       Follow Up within 90 days    - check blood sugar at prescribed times - check blood sugar if I feel it is too high or too low - enter blood sugar readings and medication or insulin into daily log    Why is this important?   Checking your blood sugar at home helps to keep it from getting very high or very low.  Writing the results in a diary or log helps the doctor know how to care for you.  Your blood sugar log should have the time, date and the results.  Also, write down the amount of insulin or other medicine that you take.  Other information, like what you ate, exercise done and how you were feeling, will also be helpful.     Notes:         Patient Care Plan: General Pharmacy (Adult)     Problem Identified: Hypertension, Hyperlipidemia, Diabetes, Coronary Artery Disease, GERD, Anxiety, Tobacco use and History of Renal Transplant   Priority: High     Long-Range Goal: Patient-Specific Goal   Start Date: 05/19/2020  Expected End Date: 12/12/2021  This Visit's Progress: On track  Recent Progress: On track  Priority: High  Note:   Current Barriers:  Unable to independently afford treatment regimen Unable to achieve control of Diabetes   Pharmacist Clinical Goal(s):  Over the next 90 days, patient will verbalize ability to afford treatment regimen achieve control of Diabetes as evidenced by A1c less than 7% through collaboration with PharmD and provider.   Interventions: 1:1 collaboration with Birdie Sons, MD regarding development and update of comprehensive plan of care as evidenced by  provider attestation and co-signature Inter-disciplinary care team collaboration (see longitudinal plan of care) Comprehensive medication review performed; medication list updated in electronic medical record  Hypertension (BP goal <130/80) -Controlled -Current treatment: Amlodipine 10 mg daily  Carvedilol 6.25 mg twice daily  Losartan 100 mg daily  -Medications previously tried: NA  -Current home readings: 120-140/70-80  -Denies hypotensive/hypertensive symptoms -Educated on Daily salt intake goal < 2300 mg; Importance of home blood pressure monitoring; -Counseled to monitor BP at home 2-3 times weekly, document, and provide log at future appointments -Recommended to continue current medication  Hyperlipidemia: (LDL goal < 70) -History of PAD, CAD  -Controlled -Current treatment: Rosuvastatin 40 mg daily  -Medications previously tried: NA  -Educated on Importance of limiting foods high in cholesterol; -Recommended to continue current medication  Diabetes (A1c goal <8%) -Uncontrolled -Current medications: [name] 10 mg daily: Appropriate, Query effective Novolog 15 units three times daily + 2 units for every 50 units above 150: Appropriate, Query effective  Tresiba 50 units daily (0.44 u/kg): Appropriate, Query effective -Medications previously tried: Lantus (Formulary)  -Current home glucose readings  Target 10/26-11/8 1/28-2/10 2/11-2/24  Number of days worn ? 14 days '14 14 14  '$ % of time active ? 70% 80% 50% 76%  Mean Glucose (mg/dL)  262 182 207  GMI (2-week A1c estimate) = 3.31 + 0.02392 x [mean glucose in mg/dL]  9.6% 7.7% 8.3%  Glycemic Variability (%CV) ?36% 38.8% 46.2% 48.4%  Time above >250 mg/dL <5% 49% 20% 35%  Time above 70-180 mg/dL <25% 26% 26% 15%  Time in range: 70-180 mg/dL >70% 25% 50% 47%  Time below 70 mg/dL <4% 0% 4% 3%  Time below 54 mg/dL <1% 0%    -Dietary Patterns: Intermittent fasting.  -Exericse Patterns: Plans to restart weekly  -Patient  has yet to receive Ozempic via patient assistance. Was able to co-ordinate getting patient voucher to get medication from pharmacy.  -START Ozempic 0.25 mg weekly for four weeks, then increase to 0.5 mg weekly.  -Recommended to continue current medication  Depression/Anxiety (Goal: Maintain stable mood and sleep) -Controlled -Current treatment: Alprazolam 2 mg three times daily as needed - Sleep  -Medications previously tried/failed: NA -PHQ9: 0 -GAD7: 0 -Educated on Benefits of medication for symptom control Benefits of cognitive-behavioral therapy with or without medication -Recommended to continue current medication   Renal Transplant  (Goal: prevent rejection of kidney ) -Managed by Dr. Holley Raring  -Controlled -Current treatment  Mycophenolate 500 mg 2 tablets twice daily  Tacrolimus 1 mg 3 capsules twice daily  -Medications previously tried: Myfortic (cost) -Drug interactions evaluated, no significant drug interactions noted with current regimen.  -Recommended to continue current medication  Allergic Rhinitis (Goal: Minimize symptoms) -Not ideally controlled -Current treatment  None -Medications previously tried: Flonase (ineffective), Zyrtec (ineffective) -Symptoms significantly worse. Patient reports itchy eyes, congestion.  -Recommended claritin 10 mg daily  Chronic Kidney Disease Stage 3a  -All medications assessed for renal dosing and appropriateness in chronic kidney disease. -Recommended to continue current medication  Patient Goals/Self-Care Activities Over the next 90 days, patient will:  - check glucose 2-3 times daily , document, and provide at future appointments -check blood pressure 2-3 times weekly , document, and provide at future appointments -decrease cigarette use   Follow Up Plan: Telephone follow up appointment with care management team member scheduled for:  06/13/2021 at 3:45 PM    Patient agreed to services and verbal consent obtained.   Patient  verbalizes understanding of instructions and care plan provided today and agrees to view in [place]. Active MyChart status confirmed with patient.    [name], PharmD, Para March, [name]  Clinical Pharmacist Practitioner  [facility] [phone]

## 2021-05-29 NOTE — Telephone Encounter (Signed)
Medication Refill - Medication: oxyCODONE (OXY IR/ROXICODONE) 5 MG immediate release tablet  ? ?Has the patient contacted their pharmacy? No. ?(Agent: If no, request that the patient contact the pharmacy for the refill. If patient does not wish to contact the pharmacy document the reason why and proceed with request.) ?(Agent: If yes, when and what did the pharmacy advise?) ? ?Preferred Pharmacy (with phone number or street name): [facility] on [address] ?Has the patient been seen for an appointment in the last year OR does the patient have an upcoming appointment? Yes.   ? ?Agent: Please be advised that RX refills may take up to 3 business days. We ask that you follow-up with your pharmacy. ?

## 2021-05-30 ENCOUNTER — Ambulatory Visit: Payer: Self-pay | Admitting: Family Medicine

## 2021-05-30 ENCOUNTER — Ambulatory Visit: Payer: Medicare HMO | Admitting: Physician Assistant

## 2021-05-30 NOTE — Telephone Encounter (Signed)
Requested medication (s) are due for refill today: Yes ? ?Requested medication (s) are on the active medication list: Yes ? ?Last refill:  05/02/21 ? ?Future visit scheduled: Yes ? ?Notes to clinic:  See request. ? ? ? ?Requested Prescriptions  ?Pending Prescriptions Disp Refills  ? oxyCODONE (OXY IR/ROXICODONE) 5 MG immediate release tablet 240 tablet 0  ?  Sig: Take 1-2 tablets (5-10 mg total) by mouth every 6 (six) hours as needed for severe pain.  ?  ? Not Delegated - Analgesics:  Opioid Agonists Failed - 05/29/2021  2:52 PM  ?  ?  Failed - This refill cannot be delegated  ?  ?  Failed - Urine Drug Screen completed in last 360 days  ?  ?  Failed - Valid encounter within last 3 months  ?  Recent Outpatient Visits   ? ?      ? 7 months ago Type 2 diabetes mellitus with diabetic nephropathy, without long-term current use of insulin ([place])  ? [facility] Birdie Sons, MD  ? 11 months ago Type 2 diabetes mellitus with diabetic nephropathy, without long-term current use of insulin ([place])  ? [facility] Birdie Sons, MD  ? 1 year ago Type 2 diabetes mellitus with diabetic nephropathy, with long-term current use of insulin ([place])  ? [facility] Birdie Sons, MD  ? 1 year ago Type 2 diabetes mellitus with diabetic nephropathy, without long-term current use of insulin ([place])  ? [facility] Birdie Sons, MD  ? 2 years ago Mixed hyperlipidemia  ? [facility] Caryn Section, Kirstie Peri, MD  ? ?  ?  ?Future Appointments   ? ?        ? In 2 weeks Fisher, Kirstie Peri, MD [facility]  ? In 2 months Vanga, Tally Due, MD [place]  ? ?  ? ?  ?  ?  ? ?

## 2021-05-31 ENCOUNTER — Ambulatory Visit: Payer: Medicare HMO | Admitting: Podiatry

## 2021-05-31 ENCOUNTER — Encounter: Payer: Self-pay | Admitting: Podiatry

## 2021-05-31 ENCOUNTER — Telehealth: Payer: Self-pay

## 2021-05-31 ENCOUNTER — Other Ambulatory Visit: Payer: Self-pay

## 2021-05-31 DIAGNOSIS — Z794 Long term (current) use of insulin: Secondary | ICD-10-CM | POA: Diagnosis not present

## 2021-05-31 DIAGNOSIS — M79676 Pain in unspecified toe(s): Secondary | ICD-10-CM | POA: Diagnosis not present

## 2021-05-31 DIAGNOSIS — B351 Tinea unguium: Secondary | ICD-10-CM

## 2021-05-31 DIAGNOSIS — E0859 Diabetes mellitus due to underlying condition with other circulatory complications: Secondary | ICD-10-CM | POA: Diagnosis not present

## 2021-05-31 DIAGNOSIS — I739 Peripheral vascular disease, unspecified: Secondary | ICD-10-CM

## 2021-05-31 DIAGNOSIS — E1121 Type 2 diabetes mellitus with diabetic nephropathy: Secondary | ICD-10-CM

## 2021-05-31 MED ORDER — ONETOUCH DELICA PLUS LANCET30G MISC: 11 refills | Status: DC

## 2021-05-31 MED ORDER — OXYCODONE HCL 5 MG PO TABS: 5.0000 mg | ORAL_TABLET | Freq: Four times a day (QID) | ORAL | 0 refills | Status: DC | PRN

## 2021-05-31 MED ORDER — ONETOUCH VERIO VI STRP: ORAL_STRIP | 11 refills | Status: DC

## 2021-05-31 NOTE — Progress Notes (Signed)
? ? ?Chronic Care Management ?Pharmacy Assistant  ? ?Name: James Moreno  MRN: [id number] DOB: 10/20/1965 ? ?DME Coordination ? ?Patient left a VM requesting I give him a call back regarding his glucometer. I spoke with the patient, and he reports that his Elenor Legato is not always correct so he would like to have a back up for his blood sugar checks. Patient is requesting that [name] sends in refills to his local pharmacy for test strips and lancets.  ? ?[name] notified of request. ? ?[name] sent patient's prescriptions for his lancets and test strips to his local [facility] on [address].  ? ?I contacted the patient and updated him regarding his prescriptions. I also educated the patient that per [name] the Freestyle Elenor Legato has a lag-time when the blood sugars are checked with this sensor vs when you do a finger prick. I informed the patient's the numbers would not match up.  ? ?I also informed the patient that if he is having issues with keeping his sensor on there are covers for the sensors like patches that can be purchased that goes over the sensor for a more secure fit. Patient verbalized understanding to all. ? ?Patient also requested if there was patient assistance for his Narcan that he has to get as his copayment is 45.00. I informed him I am not really sure there is a patient assistance program for this medication, but I will look into it for him as well as maybe a possible Tier Reduction with his insurance. ? ?After my research there is no patient assistance for Narcan, but the reason the medication has a 45.00 copayment is that it's a Tier 3 drug with [employer]. Dr. Caryn Section did not order this medication so I can not request the Tier Reduction for the patient. I did notify the patient of the information and informed him that he can contact the ordering providers office and request they submit the Tier Reduction paperwork for him. I encouraged the patient to give me a call if he has any questions.   ? ? ?Medications: ?Outpatient Encounter Medications as of 05/31/2021  ?Medication Sig Note  ? alprazolam (XANAX) 2 MG tablet Take 1 tablet (2 mg total) by mouth 3 (three) times daily as needed for sleep.   ? amLODipine (NORVASC) 10 MG tablet Take 10 mg by mouth daily.    ? amLODipine (NORVASC) 10 MG tablet Take 1 tablet (10 mg total) by mouth daily.   ? B-D ULTRAFINE III SHORT PEN 31G X 8 MM MISC    ? BD INSULIN SYRINGE U/F 31G X 5/16" 1 ML MISC    ? Blood Glucose Monitoring Suppl (GLUCOCOM BLOOD GLUCOSE MONITOR) DEVI Frequency:ONCE   Dosage:0.0     Instructions:  Note:Dose: N/A   ? calcitRIOL (ROCALTROL) 0.25 MCG capsule Take 1 capsule (0.25 mcg total) by mouth daily.   ? carvedilol (COREG) 12.5 MG tablet Take 12.5 mg by mouth 2 (two) times daily.   ? cinacalcet (SENSIPAR) 30 MG tablet Take 1 tablet (30 mg total) by mouth daily.   ? dapagliflozin propanediol ([name]) 10 MG TABS tablet Take 1 tablet (10 mg total) by mouth daily before breakfast. Patient receives through [name] through Dec 2023   ? EDEX 40 MCG injection 40 mcg by Intracavitary route as needed.    ? fenofibrate (TRICOR) 145 MG tablet    ? insulin aspart (NOVOLOG) 100 UNIT/ML injection Inject 15 Units into the skin 3 (three) times daily before meals. 5/23/2022Frances Maywood  through [name] Patient Assistance through Dec 2022 ?  ? insulin degludec (TRESIBA FLEXTOUCH) 100 UNIT/ML FlexTouch Pen Inject 45 Units into the skin daily. 5/23/2022Frances [place] through [name] Patient Assistance through Dec 2022  ? Lancets (ONETOUCH DELICA PLUS [id number]) MISC    ? LANTUS SOLOSTAR 100 UNIT/ML Solostar Pen    ? losartan (COZAAR) 100 MG tablet Take 1 tablet (100 mg total) by mouth at bedtime.   ? naloxone (NARCAN) nasal spray 4 mg/0.1 mL Place 1 spray into the nose once.    ? nicotine (NICODERM CQ) 14 mg/24hr patch Place 1 patch (14 mg total) onto the skin daily.   ? NOVOLOG 100 UNIT/ML injection    ? omeprazole (PRILOSEC) 20 MG capsule TAKE 2 CAPSULES TWICE  DAILY BEFORE A MEAL   ? omeprazole (PRILOSEC) 20 MG capsule Take 2 capsules (40 mg total) by mouth 2 (two) times daily before a meal.   ? ONETOUCH VERIO test strip    ? oxyCODONE (OXY IR/ROXICODONE) 5 MG immediate release tablet Take 1-2 tablets (5-10 mg total) by mouth every 6 (six) hours as needed for severe pain.   ? rosuvastatin (CRESTOR) 40 MG tablet Take 1 tablet (40 mg total) by mouth daily.   ? Semaglutide,0.25 or 0.'5MG'$ /DOS, (OZEMPIC, 0.25 OR 0.5 MG/DOSE,) 2 MG/1.5ML SOPN Inject 0.25 mg into the skin for four weeks, then inject 0.5 mg into the skin weekly.   ? sildenafil (REVATIO) 20 MG tablet    ? tacrolimus (PROGRAF) 1 MG capsule Take 3 mg by mouth 2 (two) times daily.    ? tretinoin (RETIN-A) 0.05 % cream Apply topically as needed.    ? triamcinolone cream (KENALOG) 0.1 % APPLY DAILY TO INFLAMED BUMPS AS NEEDED   ? XYOSTED 50 MG/0.5ML SOAJ    ? ?No facility-administered encounter medications on file as of 05/31/2021.  ? ? ?Lynann Bologna, CPA/CMA ?Clinical Pharmacist Assistant ?Phone: [phone]  ? ?

## 2021-05-31 NOTE — Progress Notes (Signed)
He presents today chief complaint of painful elongated toenails 1 through 5 bilaterally. ? ?Objective: Pulses are present capillary fill time is a little bit sluggish.  Toenails are long thick yellow dystrophic clinically mycotic.  No open lesions or wounds. ? ?Assessment: Pain in limb secondary to onychomycosis and diabetic changes. ? ?Plan: Debridement of toenails 1 through 5 bilateral. ?

## 2021-06-12 ENCOUNTER — Telehealth: Payer: Self-pay

## 2021-06-12 NOTE — Progress Notes (Signed)
? ? ?  Chronic Care Management ?Pharmacy Assistant  ? ?Name: James Moreno  MRN: [id number] DOB: 1965-06-21 ? ?Patient called to be reminded of his telephone appointment with [name], [name] on 06/13/2021 @ 1545 ? ?No answer, left message of appointment date, time and type of appointment (either telephone or in person). Left message to have all medications, supplements, blood pressure and/or blood sugar logs available during appointment and to return call if need to reschedule. ? ?Star Rating Drug: ?Ozempic 0.25 mg patient is currently on Patient Assistance Program through [name] for this medication ?Losartan 100 mg last filled on 03/01/2021 for a 90-Day supply with [facility] ?Rosuvastatin 40 mg last filled on 04/11/2021 for a 10-Day supply with [facility] ?[name] 10 mg patient currently receives this medication through [name] Patient Assistance Program ? ?Any gaps in medications fill history? Yes ? ?Care Gaps: ?HIV Screening ?Zoster Vaccine ?Hemoglobin A1C  ?Tetanus/TDAP ?Diabetic Eye Exam ? ? ?Lynann Bologna, CPA/CMA ?Clinical Pharmacist Assistant ?Phone: [phone]  ? ?

## 2021-06-13 ENCOUNTER — Ambulatory Visit (INDEPENDENT_AMBULATORY_CARE_PROVIDER_SITE_OTHER): Payer: Medicare HMO | Admitting: Family Medicine

## 2021-06-13 ENCOUNTER — Other Ambulatory Visit: Payer: Self-pay

## 2021-06-13 ENCOUNTER — Ambulatory Visit: Payer: Medicare HMO

## 2021-06-13 ENCOUNTER — Encounter: Payer: Self-pay | Admitting: Family Medicine

## 2021-06-13 VITALS — BP 151/89 | HR 90 | Temp 98.2°F | Resp 16 | Wt 232.0 lb

## 2021-06-13 DIAGNOSIS — F1190 Opioid use, unspecified, uncomplicated: Secondary | ICD-10-CM | POA: Diagnosis not present

## 2021-06-13 DIAGNOSIS — N2581 Secondary hyperparathyroidism of renal origin: Secondary | ICD-10-CM

## 2021-06-13 DIAGNOSIS — E1121 Type 2 diabetes mellitus with diabetic nephropathy: Secondary | ICD-10-CM

## 2021-06-13 DIAGNOSIS — N1832 Chronic kidney disease, stage 3b: Secondary | ICD-10-CM

## 2021-06-13 DIAGNOSIS — E782 Mixed hyperlipidemia: Secondary | ICD-10-CM

## 2021-06-13 DIAGNOSIS — Z23 Encounter for immunization: Secondary | ICD-10-CM

## 2021-06-13 DIAGNOSIS — R52 Pain, unspecified: Secondary | ICD-10-CM | POA: Diagnosis not present

## 2021-06-13 DIAGNOSIS — F17210 Nicotine dependence, cigarettes, uncomplicated: Secondary | ICD-10-CM

## 2021-06-13 DIAGNOSIS — I10 Essential (primary) hypertension: Secondary | ICD-10-CM | POA: Diagnosis not present

## 2021-06-13 DIAGNOSIS — Z289 Immunization not carried out for unspecified reason: Secondary | ICD-10-CM

## 2021-06-13 DIAGNOSIS — E113592 Type 2 diabetes mellitus with proliferative diabetic retinopathy without macular edema, left eye: Secondary | ICD-10-CM | POA: Diagnosis not present

## 2021-06-13 DIAGNOSIS — L905 Scar conditions and fibrosis of skin: Secondary | ICD-10-CM

## 2021-06-13 MED ORDER — TETANUS-DIPHTH-ACELL PERTUSSIS 5-2.5-18.5 LF-MCG/0.5 IM SUSY: 0.5000 mL | PREFILLED_SYRINGE | Freq: Once | INTRAMUSCULAR | 0 refills | Status: AC

## 2021-06-13 MED ORDER — SHINGRIX 50 MCG/0.5ML IM SUSR: 0.5000 mL | Freq: Once | INTRAMUSCULAR | 0 refills | Status: AC

## 2021-06-13 NOTE — Progress Notes (Signed)
?  ? ?I,Roshena L Chambers,acting as a scribe for Lelon Huh, MD.,have documented all relevant documentation on the behalf of Lelon Huh, MD,as directed by  Lelon Huh, MD while in the presence of Lelon Huh, MD.  ? ?Established patient visit ? ? ?Patient: James Moreno   DOB: [birth year]   56 y.o. Male  MRN: [id number] ?Visit Date: 06/13/2021 ? ?Today's healthcare provider: Lelon Huh, MD  ? ?Chief Complaint  ?Patient presents with  ? Pain Management  ? Hypertension  ? ?Subjective  ?  ?HPI  ?Follow up for chronic pain: ? ?The patient was last seen for this 8 months ago. ?Changes made at last visit include none; UDS was done. ? ?He reports good compliance with treatment. ?He feels that condition is Unchanged. ?He is not having side effects.  ? ?-----------------------------------------------------------------------------------------  ? ?Hypertension, follow-up ? ?BP Readings from Last 3 Encounters:  ?06/13/21 (!) 151/89  ?03/30/21 (!) 155/89  ?10/21/20 140/60  ? Wt Readings from Last 3 Encounters:  ?06/13/21 232 lb (105.2 kg)  ?03/30/21 230 lb 2 oz (104.4 kg)  ?10/21/20 224 lb 2 oz (101.7 kg)  ?  ? ?He was last seen for hypertension 8 months ago.  ?BP at that visit was 164/76. Management since that visit includes continue same medication. ? ?He reports good compliance with treatment. ?He is not having side effects.  ?He is following a Regular diet. ?He is not exercising. ?He does smoke. ? ?Use of agents associated with hypertension: none.  ? ?Outside blood pressures are averaging 130-140/ 70-80. ?Symptoms: ?No chest pain No chest pressure  ?No palpitations No syncope  ?No dyspnea No orthopnea  ?No paroxysmal nocturnal dyspnea No lower extremity edema  ? ?Pertinent labs: ?Lab Results  ?Component Value Date  ? CHOL 141 06/20/2020  ? HDL 46 06/20/2020  ? [place] 53 06/20/2020  ? TRIG 268 (H) 06/20/2020  ? CHOLHDL 3.1 06/20/2020  ? Lab Results  ?Component Value Date  ? NA 139 06/20/2020  ? K 4.2  06/20/2020  ? CREATININE 1.72 (H) 06/20/2020  ? EGFR 46 (L) 06/20/2020  ? GLUCOSE 62 (L) 06/20/2020  ? TSH 1.34 12/12/2018  ?  ? ?The 10-year ASCVD risk score (Arnett DK, et al., 2019) is: 22.7%  ? ?---------------------------------------------------------------------------------------------------  ?He continues to follow up with Dr. Manfred Shirts for diabetes.  ?Lab Results  ?Component Value Date  ? HGBA1C 8.1 02/28/2021  ?  ? ?He continues to follow up with nephrology ? ?Medications: ?Outpatient Medications Prior to Visit  ?Medication Sig  ? alprazolam (XANAX) 2 MG tablet Take 1 tablet (2 mg total) by mouth 3 (three) times daily as needed for sleep.  ? amLODipine (NORVASC) 10 MG tablet Take 10 mg by mouth daily.   ? amLODipine (NORVASC) 10 MG tablet Take 1 tablet (10 mg total) by mouth daily.  ? B-D ULTRAFINE III SHORT PEN 31G X 8 MM MISC   ? BD INSULIN SYRINGE U/F 31G X 5/16" 1 ML MISC   ? Blood Glucose Monitoring Suppl (GLUCOCOM BLOOD GLUCOSE MONITOR) DEVI Frequency:ONCE   Dosage:0.0     Instructions:  Note:Dose: N/A  ? calcitRIOL (ROCALTROL) 0.25 MCG capsule Take 1 capsule (0.25 mcg total) by mouth daily.  ? carvedilol (COREG) 12.5 MG tablet Take 12.5 mg by mouth 2 (two) times daily.  ? cinacalcet (SENSIPAR) 30 MG tablet Take 1 tablet (30 mg total) by mouth daily.  ? dapagliflozin propanediol ([name]) 10 MG TABS tablet Take 1 tablet (10 mg total) by mouth  daily before breakfast. Patient receives through [name] through Dec 2023  ? EDEX 40 MCG injection 40 mcg by Intracavitary route as needed.   ? fenofibrate (TRICOR) 145 MG tablet   ? insulin aspart (NOVOLOG) 100 UNIT/ML injection Inject 15 Units into the skin 3 (three) times daily before meals.  ? insulin degludec (TRESIBA FLEXTOUCH) 100 UNIT/ML FlexTouch Pen Inject 45 Units into the skin daily.  ? Lancets (ONETOUCH DELICA PLUS [id number]) MISC Use to monitor blood sugars four times daily as directed. DX E11.21, Z79.4  ? LANTUS SOLOSTAR 100 UNIT/ML Solostar Pen   ?  losartan (COZAAR) 100 MG tablet Take 1 tablet (100 mg total) by mouth at bedtime.  ? naloxone (NARCAN) nasal spray 4 mg/0.1 mL Place 1 spray into the nose once.   ? NOVOLOG 100 UNIT/ML injection   ? omeprazole (PRILOSEC) 20 MG capsule TAKE 2 CAPSULES TWICE DAILY BEFORE A MEAL  ? ONETOUCH VERIO test strip Use to monitor blood sugars four times daily as directed. DX E11.21, Z79.4  ? oxyCODONE (OXY IR/ROXICODONE) 5 MG immediate release tablet Take 1-2 tablets (5-10 mg total) by mouth every 6 (six) hours as needed for severe pain.  ? rosuvastatin (CRESTOR) 40 MG tablet Take 1 tablet (40 mg total) by mouth daily.  ? Semaglutide,0.25 or 0.5MG/DOS, (OZEMPIC, 0.25 OR 0.5 MG/DOSE,) 2 MG/1.5ML SOPN Inject 0.25 mg into the skin for four weeks, then inject 0.5 mg into the skin weekly.  ? sildenafil (REVATIO) 20 MG tablet   ? tacrolimus (PROGRAF) 1 MG capsule Take 3 mg by mouth 2 (two) times daily.   ? tretinoin (RETIN-A) 0.05 % cream Apply topically as needed.   ? triamcinolone cream (KENALOG) 0.1 % APPLY DAILY TO INFLAMED BUMPS AS NEEDED  ? XYOSTED 50 MG/0.5ML SOAJ   ? nicotine (NICODERM CQ) 14 mg/24hr patch Place 1 patch (14 mg total) onto the skin daily. (Patient not taking: Reported on 06/13/2021)  ? omeprazole (PRILOSEC) 20 MG capsule Take 2 capsules (40 mg total) by mouth 2 (two) times daily before a meal.  ? ?No facility-administered medications prior to visit.  ? ? ?Review of Systems  ?Constitutional:  Negative for appetite change, chills and fever.  ?Respiratory:  Negative for chest tightness, shortness of breath and wheezing.   ?Cardiovascular:  Negative for chest pain and palpitations.  ?Gastrointestinal:  Negative for abdominal pain, nausea and vomiting.  ? ? ?  Objective  ?  ?BP (!) 151/89 (BP Location: Right Arm, Patient Position: Sitting, Cuff Size: Large)   Pulse 90   Temp 98.2 ?F (36.8 ?C) (Oral)   Resp 16   Wt 232 lb (105.2 kg)   SpO2 100% Comment: room air  BMI 32.36 kg/m?  ? ?Today's Vitals  ? 06/13/21  1028 06/13/21 1042  ?BP: (!) 179/87 (!) 151/89  ?Pulse: 90   ?Resp: 16   ?Temp: 98.2 ?F (36.8 ?C)   ?TempSrc: Oral   ?SpO2: 100%   ?Weight: 232 lb (105.2 kg)   ? ?Body mass index is 32.36 kg/m?.  ? ? ?Physical Exam  ? ?General: Appearance:    Mildly obese male in no acute distress  ?Eyes:    PERRL, conjunctiva/corneas clear, EOM's intact       ?Lungs:     Clear to auscultation bilaterally, respirations unlabored  ?Heart:    Normal heart rate. Normal rhythm. No murmurs, rubs, or gallops.    ?MS:   All extremities are intact.    ?Neurologic:   Awake, alert, oriented x  3. No apparent focal neurological defect.   ?   ?  ? Assessment & Plan  ?  ?1. Primary hypertension ?Not quite to goal. Has upcoming appointment with nephrology continue same meds for now.   ? ?2. Mixed hyperlipidemia ?He is tolerating rosuvastatin well with no adverse effects.  a ? ?3. Secondary hyperparathyroidism of renal origin [facility]) ?Continue regular renal follow up.  ? ?4. 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  ? ?5. Pain in surgical scar ? ? ?6. Chronic, continuous use of opioids ?Stable on current medications.  ?- Pain Mgt Scrn (14 Drugs), Ur ? ?7. Chronic kidney disease, stage 3b ([place]) ?Continue routine follow up nephrology.  ? ? ?8. Smoking greater than 20 pack years ? ?- Ambulatory Referral Lung Cancer Screening [facility] ? ?9. Prescription for Tdap. Vaccine not administered in office.  ? ?- Tdap (BOOSTRIX) 5-2.5-18.5 LF-MCG/0.5 injection; Inject 0.5 mLs into the muscle once for 1 dose.  Dispense: 0.5 mL; Refill: 0 ? ?10. Prescription for Shingrix. Vaccine not administered in office.  ? ?- Zoster Vaccine Adjuvanted [facility]) injection; Inject 0.5 mLs into the muscle once for 1 dose. Repeat after 2 months  Dispense: 0.5 mL; Refill: 0 ? ? ?   ? ?The entirety of the information documented in the History of Present Illness, Review of Systems and Physical Exam were  personally obtained by me. Portions of this information were initially documented by the CMA and reviewed by me for thoroughness and accuracy.   ? ? ?Lelon Huh, MD  ?[facility] ?[phone] (phone)

## 2021-06-13 NOTE — Progress Notes (Signed)
? ?Chronic Care Management ?Pharmacy Note ? ?06/14/2021 ?Name:  James Moreno MRN:  [id number] DOB:  07-20-1965 ? ?Summary: ?Patient presents for CCM follow-up. Patient reports nausea, multiple episodes of vomiting since starting Ozempic.  ? ?Recommendations/Changes made from today's visit: ?-Patient to continue on Ozempic 0.25 mg weekly until follow-up with Endocrinology, encouraged patient to reach out and see if sooner follow-up could be scheduled.  ? ?Plan: ?[name] follow-up 3 months ? ?Subjective: ?James Moreno is an 56 y.o. year old male who is a primary patient of Honor Junes, Arvid Right, MD.  The CCM team was consulted for assistance with disease management and care coordination needs.   ? ?Engaged with patient by telephone for follow up visit in response to provider referral for pharmacy case management and/or care coordination services.  ? ?Consent to Services:  ?The patient was given information about Chronic Care Management services, agreed to services, and gave verbal consent prior to initiation of services.  Please see initial visit note for detailed documentation.  ? ?Patient Care Team: ?Lonia Farber, MD as PCP - General (Internal Medicine) ?Minna Merritts, MD as PCP - Cardiology (Cardiology) ?Pa, [place] [facility]) ?Anthonette Legato, MD (Nephrology) ?Long, Thomes Cake, MD as Referring Physician (Vascular Surgery) ?Isaias Sakai, MD as Referring Physician (Ophthalmology) ?Lin Landsman, MD as Consulting Physician (Gastroenterology) ?Wellington Hampshire, MD as Consulting Physician (Cardiology) ?Germaine Pomfret, [facility] (Pharmacist) ?Hyatt, Max T, DPM as Veterinary surgeon) ? ?Recent office visits: ?06/20/20: Patient presented to Dr. Caryn Section for follow-up. Chantix stopped, patient started on Nicotine Patch 21 mg daily.  ?02/12/20: Video visit with Dr. Caryn Section for follow-up. Patient given Lantus 30 units daily due to cost .  ? ?Recent consult visits: ?11/01/20: Patient  presented to Dr. Holley Raring (Nephrology). [name] 10 mg daily added. ?10/21/20: Patient presented to Dr. Rockey Situ (Cardiology) for follow-up.  ?07/21/20: Patient presented to Dr. Holley Raring (Nephrology) for follow-up.  ?06/01/20: Patient presented to Dr. Ronnald Collum (Endocrinology) for initial visit.  ?05/12/20: Patient presented to Laurann Montana, NP for follow-up. Claudication stable.  ? ?Hospital visits: ?None in previous 6 months ? ?Objective: ? ?Lab Results  ?Component Value Date  ? CREATININE 1.72 (H) 06/20/2020  ? BUN 29 (H) 06/20/2020  ? GFRNONAA 39 (L) 09/17/2019  ? GFRAA 45 (L) 09/17/2019  ? NA 139 06/20/2020  ? K 4.2 06/20/2020  ? CALCIUM 10.7 (H) 06/20/2020  ? CO2 21 06/20/2020  ? ? ?Lab Results  ?Component Value Date/Time  ? HGBA1C 8.8 05/25/2020 12:00 AM  ? HGBA1C 9.7 (H) 09/17/2019 02:17 PM  ? HGBA1C 6.7 12/12/2018 12:00 AM  ? MICROALBUR 100 12/31/2016 02:13 PM  ?  ?Last diabetic Eye exam:  ?Lab Results  ?Component Value Date/Time  ? HMDIABEYEEXA Retinopathy (A) 02/15/2020 12:00 AM  ?  ?Last diabetic Foot exam: No results found for: HMDIABFOOTEX  ? ?Lab Results  ?Component Value Date  ? CHOL 141 06/20/2020  ? HDL 46 06/20/2020  ? Artas 53 06/20/2020  ? TRIG 268 (H) 06/20/2020  ? CHOLHDL 3.1 06/20/2020  ? ? ? ?  Latest Ref Rng & Units 06/20/2020  ?  9:03 AM 09/17/2019  ?  2:17 PM 12/12/2018  ? 12:00 AM  ?Hepatic Function  ?Total Protein 6.0 - 8.5 g/dL  6.5     ?Albumin 3.8 - 4.9 g/dL 4.2   4.3     ?AST 0 - 40 IU/L  9   14       ?ALT 0 - 44 IU/L  9   16       ?Alk Phosphatase 48 - 121 IU/L  131   95       ?Total Bilirubin 0.0 - 1.2 mg/dL  0.6     ?  ? This result is from an external source.  ? ? ?Lab Results  ?Component Value Date/Time  ? TSH 1.34 12/12/2018 12:00 AM  ? TSH 1.72 06/13/2006 12:00 AM  ? ? ? ?  Latest Ref Rng & Units 12/12/2018  ? 12:00 AM 10/21/2015  ?  1:10 PM 06/21/2013  ?  2:29 PM  ?CBC  ?WBC  6.7      8.9   9.7    ?Hemoglobin 13.5 - 17.5 14.8      16.3   10.1    ?Hematocrit 41 - 53 43      46.7   28.9     ?Platelets 150 - 399 182      140   175    ?  ? This result is from an external source.  ? ? ?No results found for: VD25OH ? ?Clinical ASCVD: Yes  ?The 10-year ASCVD risk score (Arnett DK, et al., 2019) is: 22.7% ?  Values used to calculate the score: ?    Age: 75 years ?    Sex: Male ?    Is Non-Hispanic African American: No ?    Diabetic: Yes ?    Tobacco smoker: Yes ?    Systolic Blood Pressure: 801 mmHg ?    Is BP treated: Yes ?    HDL Cholesterol: 46 mg/dL ?    Total Cholesterol: 141 mg/dL   ? ? ?  06/13/2021  ? 10:27 AM 02/27/2021  ? 10:59 AM 10/14/2020  ?  3:41 PM  ?Depression screen PHQ 2/9  ?Decreased Interest 0 0 0  ?Down, Depressed, Hopeless 0 0 0  ?PHQ - 2 Score 0 0 0  ?Altered sleeping 0  0  ?Tired, decreased energy 0  0  ?Change in appetite 0  0  ?Feeling bad or failure about yourself  0  0  ?Trouble concentrating 0  0  ?Moving slowly or fidgety/restless 0  0  ?Suicidal thoughts 0  0  ?PHQ-9 Score 0  0  ?Difficult doing work/chores Not difficult at all  Not difficult at all  ?  ? ? ?Social History  ? ?Tobacco Use  ?Smoking Status Every Day  ? Packs/day: 0.75  ? Years: 38.00  ? Pack years: 28.50  ? Types: Cigarettes  ?Smokeless Tobacco Never  ?Tobacco Comments  ? since age 69.  ? ?BP Readings from Last 3 Encounters:  ?06/13/21 (!) 151/89  ?03/30/21 (!) 155/89  ?10/21/20 140/60  ? ?Pulse Readings from Last 3 Encounters:  ?06/13/21 90  ?03/30/21 97  ?10/21/20 89  ? ?Wt Readings from Last 3 Encounters:  ?06/13/21 232 lb (105.2 kg)  ?03/30/21 230 lb 2 oz (104.4 kg)  ?10/21/20 224 lb 2 oz (101.7 kg)  ? ? ?Assessment/Interventions: Review of patient past medical history, allergies, medications, health status, including review of consultants reports, laboratory and other test data, was performed as part of comprehensive evaluation and provision of chronic care management services.  ? ?SDOH:  (Social Determinants of Health) assessments and interventions performed: Yes ? ? ?[place] ? ?Allergies  ?Allergen  Reactions  ? No Known Allergies   ? ? ?Medications Reviewed Today   ? ? Reviewed by Randal Buba, CMA (Certified Medical Assistant) on 06/13/21 at  [place] List Status: <None>  ? ?Medication Order Taking? Sig Documenting Provider Last Dose Status Informant  ?alprazolam (XANAX) 2 MG tablet [id number] Yes Take 1 tablet (2 mg total) by mouth 3 (three) times daily as needed for sleep. Birdie Sons, MD Taking Active   ?amLODipine (NORVASC) 10 MG tablet [id number] Yes Take 10 mg by mouth daily.  [provider] Taking Active   ?amLODipine (NORVASC) 10 MG tablet [id number] Yes Take 1 tablet (10 mg total) by mouth daily. Birdie Sons, MD Taking Active   ?B-D ULTRAFINE III SHORT PEN 31G X 8 MM MISC [id number] Yes  [provider] Taking Active   ?BD INSULIN SYRINGE U/F 31G X 5/16" 1 ML MISC [id number] Yes  [provider] Taking Active   ?Blood Glucose Monitoring Suppl ([place]) DEVI [id number] Yes Frequency:ONCE   Dosage:0.0     Instructions:  Note:Dose: N/A [provider] Taking Active Self  ?         ?Med Note Jeanie Cooks Oct 30, 2017 11:50 AM)    ?calcitRIOL (ROCALTROL) 0.25 MCG capsule [id number] Yes Take 1 capsule (0.25 mcg total) by mouth daily. Birdie Sons, MD Taking Active   ?carvedilol (COREG) 12.5 MG tablet [id number] Yes Take 12.5 mg by mouth 2 (two) times daily. [provider] Taking Active   ?cinacalcet (SENSIPAR) 30 MG tablet [id number] Yes Take 1 tablet (30 mg total) by mouth daily. Birdie Sons, MD Taking Active   ?dapagliflozin propanediol ([name]) 10 MG TABS tablet [id number] Yes Take 1 tablet (10 mg total) by mouth daily before breakfast. Patient receives through [name] through Dec 2023 Birdie Sons, MD Taking Active   ?EDEX 40 MCG injection [id number] Yes 40 mcg by Intracavitary route as needed.  [provider] Taking Active   ?fenofibrate (TRICOR) 145 MG tablet [id number] Yes  [provider] Taking Active   ?insulin aspart (NOVOLOG) 100 UNIT/ML injection [id number] Yes Inject 15 Units into the skin 3 (three) times daily before meals. [provider] Taking Active Self  ?         ?

## 2021-06-14 LAB — HM DIABETES EYE EXAM

## 2021-06-14 NOTE — Patient Instructions (Addendum)
Visit Information ?It was great speaking with you today!  Please let me know if you have any questions about our visit. ? ? Goals Addressed   ? ?  ?  ?  ?  ? This Visit's Progress  ?  Monitor and Manage My Blood Sugar-Diabetes Type 2   On track  ?  Timeframe:  Long-Range Goal ?Priority:  High ?Start Date: 05/17/2020                            ?Expected End Date: 11/16/2021                      ? ?Follow Up within 90 days  ?  ?- check blood sugar at prescribed times ?- check blood sugar if I feel it is too high or too low ?- enter blood sugar readings and medication or insulin into daily log  ?  ?Why is this important?   ?Checking your blood sugar at home helps to keep it from getting very high or very low.  ?Writing the results in a diary or log helps the doctor know how to care for you.  ?Your blood sugar log should have the time, date and the results.  ?Also, write down the amount of insulin or other medicine that you take.  ?Other information, like what you ate, exercise done and how you were feeling, will also be helpful.   ?  ?Notes:  ?  ? ?  ? ? ?Patient Care Plan: General Pharmacy (Adult)  ?  ? ?Problem Identified: Hypertension, Hyperlipidemia, Diabetes, Coronary Artery Disease, GERD, Anxiety, Tobacco use and History of Renal Transplant   ?Priority: High  ?  ? ?Long-Range Goal: Patient-Specific Goal   ?Start Date: 05/19/2020  ?Expected End Date: 12/12/2021  ?This Visit's Progress: On track  ?Recent Progress: On track  ?Priority: High  ?Note:   ?Current Barriers:  ?Unable to independently afford treatment regimen ?Unable to achieve control of Diabetes  ? ?Pharmacist Clinical Goal(s):  ?Over the next 90 days, patient will verbalize ability to afford treatment regimen ?achieve control of Diabetes as evidenced by A1c less than 7% through collaboration with PharmD and provider.  ? ?Interventions: ?1:1 collaboration with Birdie Sons, MD regarding development and update of comprehensive plan of care as evidenced by  provider attestation and co-signature ?Inter-disciplinary care team collaboration (see longitudinal plan of care) ?Comprehensive medication review performed; medication list updated in electronic medical record ? ?Hypertension (BP goal <130/80) ?-Controlled ?-Current treatment: ?Amlodipine 10 mg daily: Appropriate, Effective, Safe, Accessible  ?Carvedilol 6.25 mg twice daily: Appropriate, Effective, Safe, Accessible   ?Losartan 100 mg daily: Appropriate, Effective, Safe, Accessible   ?-Medications previously tried: NA  ?-Current home readings: 120-140/70-80  ?-Denies hypotensive/hypertensive symptoms ?-Educated on Daily salt intake goal < 2300 mg; ?Importance of home blood pressure monitoring; ?-Counseled to monitor BP at home 2-3 times weekly, document, and provide log at future appointments ?-Recommended to continue current medication ? ?Hyperlipidemia: (LDL goal < 70) ?-History of PAD, CAD  ?-Controlled ?-Current treatment: ?Rosuvastatin 40 mg daily  ?-Medications previously tried: NA  ?-Educated on Importance of limiting foods high in cholesterol; ?-Recommended to continue current medication ? ?Diabetes (A1c goal <8%) ?-Uncontrolled ?-Managed by Dr. Honor Junes, next follow-up scheduled for May 4th.  ?-Current medications: ?[name] 10 mg daily: Appropriate, Query effective ?Novolog 15 units three times daily + 2 units for every 50 units above 150: Appropriate, Query effective  ?  Ozempic 0.25 mg weekly: Appropriate, Query effective  ?Patient reports nausea, multiple episodes of vomiting.  ?Tresiba 50 units daily (0.44 u/kg): Appropriate, Query effective ?-Medications previously tried: Lantus (Formulary)  ?-Current home glucose readings ? Target 10/26-11/8 1/28-2/10 2/11-2/24 3/8-3/21  ?Number of days worn ? 14 days '14 14 14 14  '$ ?% of time active ? 70% 80% 50% 76% 83%  ?Mean Glucose (mg/dL)  262 182 207 175  ?GMI (2-week A1c estimate) ?= 3.31 + 0.02392 x [mean glucose in mg/dL]  9.6% 7.7% 8.3% 7.5%  ?Glycemic  Variability (%CV) ?36% 38.8% 46.2% 48.4% 30.7%  ?Time above >250 mg/dL <5% 49% 20% 35% 9%  ?Time above 70-180 mg/dL <25% 26% 26% 15% 38%  ?Time in range: 70-180 mg/dL >70% 25% 50% 47% 52%  ?Time below 70 mg/dL <4% 0% 4% 3% 1%  ?Time below 54 mg/dL <1% 0%   0%  ?-Patient with less variability overall, 2-3 episodes of hypoglycemia.  ?-Dietary Patterns: Intermittent fasting.  ?-Exericse Patterns: Plans to restart weekly  ?-Patient to continue on Ozempic 0.25 mg weekly until follow-up with Endocrinology, encouraged patient to reach out and see if sooner follow-up could be scheduled.     ?-Recommended to continue current medication ? ?Depression/Anxiety (Goal: Maintain stable mood and sleep) ?-Controlled ?-Current treatment: ?Alprazolam 2 mg three times daily as needed - Sleep  ?-Medications previously tried/failed: NA ?-PHQ9: 0 ?-GAD7: 0 ?-Educated on Benefits of medication for symptom control ?Benefits of cognitive-behavioral therapy with or without medication ?-Recommended to continue current medication  ? ?Renal Transplant  (Goal: prevent rejection of kidney ) ?-Managed by Dr. Holley Raring  ?-Controlled ?-Current treatment  ?Mycophenolate 500 mg 2 tablets twice daily  ?Tacrolimus 1 mg 3 capsules twice daily  ?-Medications previously tried: Myfortic (cost) ?-Drug interactions evaluated, no significant drug interactions noted with current regimen.  ?-Recommended to continue current medication ? ?Allergic Rhinitis (Goal: Minimize symptoms) ?-Not ideally controlled ?-Current treatment  ?None ?-Medications previously tried: Flonase (ineffective), Zyrtec (ineffective) ?-Symptoms significantly worse. Patient reports itchy eyes, congestion.  ?-Recommended claritin 10 mg daily ? ?Chronic Kidney Disease Stage 3a  ?-All medications assessed for renal dosing and appropriateness in chronic kidney disease. ?-Next follow-up with nephrology 06/14/21. ?-Recommended to continue current medication ? ?Patient Goals/Self-Care Activities ?Over  the next 90 days, patient will:  ?- check glucose 2-3 times daily , document, and provide at future appointments ?-check blood pressure 2-3 times weekly , document, and provide at future appointments ?-decrease cigarette use  ? ?Follow Up Plan: Telephone follow up appointment with care management team member scheduled for:  07/11/2021 at 3:45 PM ?  ? ?Patient agreed to services and verbal consent obtained.  ? ?Patient verbalizes understanding of instructions and care plan provided today and agrees to view in [facility]. Active MyChart status confirmed with patient.   ? ?[name], PharmD, BCACP, [name]  ?Clinical Pharmacist Practitioner  ?[place] ?[phone]  ?

## 2021-06-15 ENCOUNTER — Encounter: Payer: Self-pay | Admitting: Family Medicine

## 2021-06-15 LAB — PAIN MGT SCRN (14 DRUGS), UR
Amphetamine Scrn, Ur: NEGATIVE ng/mL
BARBITURATE SCREEN URINE: NEGATIVE ng/mL
BENZODIAZEPINE SCREEN, URINE: NEGATIVE ng/mL
Buprenorphine, Urine: NEGATIVE ng/mL
CANNABINOIDS UR QL SCN: NEGATIVE ng/mL
Cocaine (Metab) Scrn, Ur: NEGATIVE ng/mL
Creatinine(Crt), U: 20.2 mg/dL (ref 20.0–300.0)
Fentanyl, Urine: NEGATIVE pg/mL
Meperidine Screen, Urine: NEGATIVE ng/mL
Methadone Screen, Urine: NEGATIVE ng/mL
OXYCODONE+OXYMORPHONE UR QL SCN: POSITIVE ng/mL — AB
Opiate Scrn, Ur: NEGATIVE ng/mL
Ph of Urine: 6.1 (ref 4.5–8.9)
Phencyclidine Qn, Ur: NEGATIVE ng/mL
Propoxyphene Scrn, Ur: NEGATIVE ng/mL
Tramadol Screen, Urine: NEGATIVE ng/mL

## 2021-06-21 DIAGNOSIS — I25118 Atherosclerotic heart disease of native coronary artery with other forms of angina pectoris: Secondary | ICD-10-CM | POA: Insufficient documentation

## 2021-06-21 DIAGNOSIS — N1832 Chronic kidney disease, stage 3b: Secondary | ICD-10-CM | POA: Insufficient documentation

## 2021-06-26 ENCOUNTER — Other Ambulatory Visit: Payer: Self-pay | Admitting: Family Medicine

## 2021-06-26 DIAGNOSIS — R52 Pain, unspecified: Secondary | ICD-10-CM

## 2021-06-26 DIAGNOSIS — M79604 Pain in right leg: Secondary | ICD-10-CM

## 2021-06-26 NOTE — Telephone Encounter (Signed)
Medication Refill - Medication: oxyCODONE (OXY IR/ROXICODONE) 5 MG immediate release tablet   Has the patient contacted their pharmacy? Yes.   (Agent: If no, request that the patient contact the pharmacy for the refill. If patient does not wish to contact the pharmacy document the reason why and proceed with request.) (Agent: If yes, when and what did the pharmacy advise?)  Preferred Pharmacy (with phone number or street name):  [id number] - [place], [place] - [address]  [address] [place] [address]  Phone: [phone] Fax: [phone]   Has the patient been seen for an appointment in the last year OR does the patient have an upcoming appointment? Yes.    Agent: Please be advised that RX refills may take up to 3 business days. We ask that you follow-up with your pharmacy.  

## 2021-06-27 NOTE — Telephone Encounter (Signed)
Requested medication (s) are due for refill today: yes ? ?Requested medication (s) are on the active medication list: yes ? ?Last refill:  05/31/21 #240/0 ? ?Future visit scheduled: yes ? ?Notes to clinic:  Unable to refill per protocol, cannot delegate. ? ?  ?Requested Prescriptions  ?Pending Prescriptions Disp Refills  ? oxyCODONE (OXY IR/ROXICODONE) 5 MG immediate release tablet 240 tablet 0  ?  Sig: Take 1-2 tablets (5-10 mg total) by mouth every 6 (six) hours as needed for severe pain.  ?  ? Not Delegated - Analgesics:  Opioid Agonists Failed - 06/27/2021 11:23 AM  ?  ?  Failed - This refill cannot be delegated  ?  ?  Failed - Urine Drug Screen completed in last 360 days  ?  ?  Passed - Valid encounter within last 3 months  ?  Recent Outpatient Visits   ? ?      ? 2 weeks ago Primary hypertension  ? [facility] Birdie Sons, MD  ? 8 months ago Type 2 diabetes mellitus with diabetic nephropathy, without long-term current use of insulin ([place])  ? [facility] Birdie Sons, MD  ? 1 year ago Type 2 diabetes mellitus with diabetic nephropathy, without long-term current use of insulin ([place])  ? [facility] Birdie Sons, MD  ? 1 year ago Type 2 diabetes mellitus with diabetic nephropathy, with long-term current use of insulin ([place])  ? [facility] Birdie Sons, MD  ? 1 year ago Type 2 diabetes mellitus with diabetic nephropathy, without long-term current use of insulin ([place])  ? [facility] Caryn Section, Kirstie Peri, MD  ? ?  ?  ?Future Appointments   ? ?        ? In 1 month Vanga, Tally Due, MD [place]  ? In 5 months Fisher, Kirstie Peri, MD [facility]  ? ?  ? ?  ?  ?  ? ?

## 2021-06-28 MED ORDER — OXYCODONE HCL 5 MG PO TABS: 5.0000 mg | ORAL_TABLET | Freq: Four times a day (QID) | ORAL | 0 refills | Status: DC | PRN

## 2021-07-10 ENCOUNTER — Telehealth: Payer: Self-pay

## 2021-07-10 DIAGNOSIS — N186 End stage renal disease: Secondary | ICD-10-CM | POA: Diagnosis not present

## 2021-07-10 DIAGNOSIS — N2581 Secondary hyperparathyroidism of renal origin: Secondary | ICD-10-CM | POA: Diagnosis not present

## 2021-07-10 DIAGNOSIS — I10 Essential (primary) hypertension: Secondary | ICD-10-CM | POA: Diagnosis not present

## 2021-07-10 DIAGNOSIS — Z940 Kidney transplant status: Secondary | ICD-10-CM | POA: Diagnosis not present

## 2021-07-10 NOTE — Progress Notes (Signed)
? ? ?  Chronic Care Management ?Pharmacy Assistant  ? ?Name: James Moreno  MRN: [id number] DOB: 08-01-1965 ? ?Patient called to be reminded of his telephone appointment with [name], [name] on 07/11/2021 '@1545'$  ? ?No answer, left message of appointment date, time and type of appointment (either telephone or in person). Left message to have all medications, supplements, blood pressure and/or blood sugar logs available during appointment and to return call if need to reschedule. ? ?Star Rating Drug: ?Ozempic 0.25 mg this patient receives this via [name] patient assistance program ?Losartan 100 mg last filled on 06/05/2021 for a 90-Day supply with [facility] ?Rosuvastatin 40 mg last filled on 04/11/2021 for a 10-Day supply with [facility] ?[name] 10 mg Patient receives this medication from [name] patient assistance program ? ?Any gaps in medications fill history? Yes ? ?Care Gaps: ?HIV Screening ?Zoster Vaccine ?Tetanus/Tdap ?BP> 140/90 ? ?Lynann Bologna, CPA/CMA ?Clinical Pharmacist Assistant ?Phone: [phone]  ? ?

## 2021-07-11 ENCOUNTER — Ambulatory Visit (INDEPENDENT_AMBULATORY_CARE_PROVIDER_SITE_OTHER): Payer: Medicare HMO

## 2021-07-11 DIAGNOSIS — F17200 Nicotine dependence, unspecified, uncomplicated: Secondary | ICD-10-CM

## 2021-07-11 DIAGNOSIS — Z794 Long term (current) use of insulin: Secondary | ICD-10-CM

## 2021-07-11 DIAGNOSIS — I10 Essential (primary) hypertension: Secondary | ICD-10-CM

## 2021-07-11 NOTE — Progress Notes (Signed)
? ?Chronic Care Management ?Pharmacy Note ? ?07/12/2021 ?Name:  James Moreno MRN:  [id number] DOB:  [birth year] ? ?Summary: ?Patient presents for CCM follow-up.  ?-Wait until Endocrinology follow-up to increase Ozempic given significant side effects with lower dose, prefer slow titration schedule.   ? ?-Patient in the preparation stage of quitting smoking.  ? ? ?Recommendations/Changes made from today's visit: ?-Patient to continue on Ozempic 0.25 mg weekly until follow-up with Endocrinology ?-Recommend Nicotine Patch 21 mg/24 hr for 6 weeks + Nicotine lozenge 4 mg as needed for breakthrough cravings.  ?-Patient to work on consistency with medication timing ? ?Plan: ?[name] follow-up 1 months ? ?Subjective: ?James Moreno is an 56 y.o. year old male who is a primary patient of Fisher, Kirstie Peri, MD.  The CCM team was consulted for assistance with disease management and care coordination needs.   ? ?Engaged with patient by telephone for follow up visit in response to provider referral for pharmacy case management and/or care coordination services.  ? ?Consent to Services:  ?The patient was given information about Chronic Care Management services, agreed to services, and gave verbal consent prior to initiation of services.  Please see initial visit note for detailed documentation.  ? ?Patient Care Team: ?Birdie Sons, MD as PCP - General (Family Medicine) ?Minna Merritts, MD as PCP - Cardiology (Cardiology) ?Pa, [facility]) ?Anthonette Legato, MD (Nephrology) ?Long, Thomes Cake, MD as Referring Physician (Vascular Surgery) ?Isaias Sakai, MD as Referring Physician (Ophthalmology) ?Lin Landsman, MD as Consulting Physician (Gastroenterology) ?Wellington Hampshire, MD as Consulting Physician (Cardiology) ?Germaine Pomfret, [facility] (Pharmacist) ?Hyatt, Max T, DPM as Veterinary surgeon) ? ?Recent office visits: ?06/20/20: Patient presented to Dr. Caryn Section for follow-up. Chantix  stopped, patient started on Nicotine Patch 21 mg daily.  ?02/12/20: Video visit with Dr. Caryn Section for follow-up. Patient given Lantus 30 units daily due to cost .  ? ?Recent consult visits: ?11/01/20: Patient presented to Dr. Holley Raring (Nephrology). [name] 10 mg daily added. ?10/21/20: Patient presented to Dr. Rockey Situ (Cardiology) for follow-up.  ?07/21/20: Patient presented to Dr. Holley Raring (Nephrology) for follow-up.  ?06/01/20: Patient presented to Dr. Ronnald Collum (Endocrinology) for initial visit.  ? ?Hospital visits: ?None in previous 6 months ? ?Objective: ? ?Lab Results  ?Component Value Date  ? CREATININE 1.72 (H) 06/20/2020  ? BUN 29 (H) 06/20/2020  ? GFRNONAA 39 (L) 09/17/2019  ? GFRAA 45 (L) 09/17/2019  ? NA 139 06/20/2020  ? K 4.2 06/20/2020  ? CALCIUM 10.7 (H) 06/20/2020  ? CO2 21 06/20/2020  ? ? ?Lab Results  ?Component Value Date/Time  ? HGBA1C 8.1 02/28/2021 12:00 AM  ? HGBA1C 8.8 05/25/2020 12:00 AM  ? MICROALBUR 100 12/31/2016 02:13 PM  ?  ?Last diabetic Eye exam:  ?Lab Results  ?Component Value Date/Time  ? HMDIABEYEEXA Retinopathy (A) 06/14/2021 12:00 AM  ?  ?Last diabetic Foot exam: No results found for: HMDIABFOOTEX  ? ?Lab Results  ?Component Value Date  ? CHOL 141 06/20/2020  ? HDL 46 06/20/2020  ? [place] 53 06/20/2020  ? TRIG 268 (H) 06/20/2020  ? CHOLHDL 3.1 06/20/2020  ? ? ? ?  Latest Ref Rng & Units 06/20/2020  ?  9:03 AM 09/17/2019  ?  2:17 PM 12/12/2018  ? 12:00 AM  ?Hepatic Function  ?Total Protein 6.0 - 8.5 g/dL  6.5     ?Albumin 3.8 - 4.9 g/dL 4.2   4.3     ?AST 0 - 40 IU/L  9   14       ?ALT 0 - 44 IU/L  9   16       ?Alk Phosphatase 48 - 121 IU/L  131   95       ?Total Bilirubin 0.0 - 1.2 mg/dL  0.6     ?  ? This result is from an external source.  ? ? ?Lab Results  ?Component Value Date/Time  ? TSH 1.34 12/12/2018 12:00 AM  ? TSH 1.72 06/13/2006 12:00 AM  ? ? ? ?  Latest Ref Rng & Units 12/12/2018  ? 12:00 AM 10/21/2015  ?  1:10 PM 06/21/2013  ?  2:29 PM  ?CBC  ?WBC  6.7      8.9   9.7    ?Hemoglobin  13.5 - 17.5 14.8      16.3   10.1    ?Hematocrit 41 - 53 43      46.7   28.9    ?Platelets 150 - 399 182      140   175    ?  ? This result is from an external source.  ? ? ?No results found for: VD25OH ? ?Clinical ASCVD: Yes  ?The 10-year ASCVD risk score (Arnett DK, et al., 2019) is: 20.8% ?  Values used to calculate the score: ?    Age: 51 years ?    Sex: Male ?    Is Non-Hispanic African American: No ?    Diabetic: Yes ?    Tobacco smoker: Yes ?    Systolic Blood Pressure: 409 mmHg ?    Is BP treated: Yes ?    HDL Cholesterol: 46 mg/dL ?    Total Cholesterol: 141 mg/dL   ? ? ?  06/13/2021  ? 10:27 AM 02/27/2021  ? 10:59 AM 10/14/2020  ?  3:41 PM  ?Depression screen PHQ 2/9  ?Decreased Interest 0 0 0  ?Down, Depressed, Hopeless 0 0 0  ?PHQ - 2 Score 0 0 0  ?Altered sleeping 0  0  ?Tired, decreased energy 0  0  ?Change in appetite 0  0  ?Feeling bad or failure about yourself  0  0  ?Trouble concentrating 0  0  ?Moving slowly or fidgety/restless 0  0  ?Suicidal thoughts 0  0  ?PHQ-9 Score 0  0  ?Difficult doing work/chores Not difficult at all  Not difficult at all  ?  ? ? ?Social History  ? ?Tobacco Use  ?Smoking Status Every Day  ? Packs/day: 0.75  ? Years: 38.00  ? Pack years: 28.50  ? Types: Cigarettes  ?Smokeless Tobacco Never  ?Tobacco Comments  ? since age 24.  ? ?BP Readings from Last 3 Encounters:  ?06/13/21 (!) 151/89  ?03/30/21 (!) 155/89  ?10/21/20 140/60  ? ?Pulse Readings from Last 3 Encounters:  ?06/13/21 90  ?03/30/21 97  ?10/21/20 89  ? ?Wt Readings from Last 3 Encounters:  ?06/13/21 232 lb (105.2 kg)  ?03/30/21 230 lb 2 oz (104.4 kg)  ?10/21/20 224 lb 2 oz (101.7 kg)  ? ? ?Assessment/Interventions: Review of patient past medical history, allergies, medications, health status, including review of consultants reports, laboratory and other test data, was performed as part of comprehensive evaluation and provision of chronic care management services.  ? ?SDOH:  (Social Determinants of Health) assessments  and interventions performed: Yes ? ? ?[place] ? ?Allergies  ?Allergen Reactions  ? No Known Allergies   ? ? ?Medications Reviewed Today   ? ?  Reviewed by Randal Buba, CMA (Certified Medical Assistant) on 06/13/21 at 25  Med List Status: <None>  ? ?Medication Order Taking? Sig Documenting Provider Last Dose Status Informant  ?alprazolam (XANAX) 2 MG tablet [id number] Yes Take 1 tablet (2 mg total) by mouth 3 (three) times daily as needed for sleep. Birdie Sons, MD Taking Active   ?amLODipine (NORVASC) 10 MG tablet [id number] Yes Take 10 mg by mouth daily.  [provider] Taking Active   ?amLODipine (NORVASC) 10 MG tablet [id number] Yes Take 1 tablet (10 mg total) by mouth daily. Birdie Sons, MD Taking Active   ?B-D ULTRAFINE III SHORT PEN 31G X 8 MM MISC [id number] Yes  [provider] Taking Active   ?BD INSULIN SYRINGE U/F 31G X 5/16" 1 ML MISC [id number] Yes  [provider] Taking Active   ?Blood Glucose Monitoring Suppl ([place]) DEVI [id number] Yes Frequency:ONCE   Dosage:0.0     Instructions:  Note:Dose: N/A [provider] Taking Active Self  ?         ?Med Note Jeanie Cooks Oct 30, 2017 11:50 AM)    ?calcitRIOL (ROCALTROL) 0.25 MCG capsule [id number] Yes Take 1 capsule (0.25 mcg total) by mouth daily. Birdie Sons, MD Taking Active   ?carvedilol (COREG) 12.5 MG tablet [id number] Yes Take 12.5 mg by mouth 2 (two) times daily. [provider] Taking Active   ?cinacalcet (SENSIPAR) 30 MG tablet [id number] Yes Take 1 tablet (30 mg total) by mouth daily. Birdie Sons, MD Taking Active   ?dapagliflozin propanediol ([name]) 10 MG TABS tablet [id number] Yes Take 1 tablet (10 mg total) by mouth daily before breakfast. Patient receives through [name] through Dec 2023 Birdie Sons, MD Taking Active   ?EDEX 40 MCG injection [id number] Yes 40 mcg by Intracavitary route as needed.  [provider] Taking  Active   ?fenofibrate (TRICOR) 145 MG tablet [id number] Yes  [provider] Taking Active   ?insulin aspart (NOVOLOG) 100 UNIT/ML injection [id number] Yes Inject 15 Units into the skin 3 (three) times

## 2021-07-12 NOTE — Patient Instructions (Signed)
Visit Information ?It was great speaking with you today!  Please let me know if you have any questions about our visit. ? ? Goals Addressed   ? ?  ?  ?  ?  ? This Visit's Progress  ?  Monitor and Manage My Blood Sugar-Diabetes Type 2   On track  ?  Timeframe:  Long-Range Goal ?Priority:  High ?Start Date: 05/17/2020                            ?Expected End Date: 11/16/2021                      ? ?Follow Up within 30 days  ?  ?- check blood sugar at prescribed times ?- check blood sugar if I feel it is too high or too low ?- enter blood sugar readings and medication or insulin into daily log  ?  ?Why is this important?   ?Checking your blood sugar at home helps to keep it from getting very high or very low.  ?Writing the results in a diary or log helps the doctor know how to care for you.  ?Your blood sugar log should have the time, date and the results.  ?Also, write down the amount of insulin or other medicine that you take.  ?Other information, like what you ate, exercise done and how you were feeling, will also be helpful.   ?  ?Notes:  ?  ?  Quit Smoking   On track  ?  Recommend to continue efforts to reduce smoking habits until no longer smoking (Smoking Cessation literature attached to AVS). ? ?  ? ?  ? ?  ? ? ?Patient Care Plan: General Pharmacy (Adult)  ?  ? ?Problem Identified: Hypertension, Hyperlipidemia, Diabetes, Coronary Artery Disease, GERD, Anxiety, Tobacco use and History of Renal Transplant   ?Priority: High  ?  ? ?Long-Range Goal: Patient-Specific Goal   ?Start Date: 05/19/2020  ?Expected End Date: 12/12/2021  ?This Visit's Progress: On track  ?Recent Progress: On track  ?Priority: High  ?Note:   ?Current Barriers:  ?Unable to independently afford treatment regimen ?Unable to achieve control of Diabetes  ? ?Pharmacist Clinical Goal(s):  ?Over the next 90 days, patient will verbalize ability to afford treatment regimen ?achieve control of Diabetes as evidenced by A1c less than 7% through collaboration  with PharmD and provider.  ? ?Interventions: ?1:1 collaboration with Birdie Sons, MD regarding development and update of comprehensive plan of care as evidenced by provider attestation and co-signature ?Inter-disciplinary care team collaboration (see longitudinal plan of care) ?Comprehensive medication review performed; medication list updated in electronic medical record ? ?Hypertension (BP goal <130/80) ?-Controlled ?-Current treatment: ?Amlodipine 10 mg daily: Appropriate, Effective, Safe, Accessible  ?Carvedilol 6.25 mg twice daily: Appropriate, Effective, Safe, Accessible   ?Losartan 100 mg daily: Appropriate, Effective, Safe, Accessible   ?-Medications previously tried: NA  ?-Current home readings:  ?-Denies hypotensive/hypertensive symptoms ?-Educated on Daily salt intake goal < 2300 mg; ?Importance of home blood pressure monitoring; ?-Counseled to monitor BP at home 2-3 times weekly, document, and provide log at future appointments ?-Recommended to continue current medication ? ?Hyperlipidemia: (LDL goal < 70) ?-History of PAD, CAD  ?-Controlled ?-Current treatment: ?Rosuvastatin 40 mg daily  ?-Medications previously tried: NA  ?-Educated on Importance of limiting foods high in cholesterol; ?-Recommended to continue current medication ? ?Diabetes (A1c goal <8%) ?-Uncontrolled ?-Managed by Dr. Honor Junes, next  follow-up scheduled for May 4th.  ?-Current medications: ?[name] 10 mg daily: Appropriate, Query effective ?Novolog 15 units three times daily + 2 units for every 50 units above 150: Appropriate, Query effective  ?Ozempic 0.25 mg weekly: Appropriate, Query effective  ?Nausea has resolved in the past week ?Tresiba 50 units daily (0.44 u/kg): Appropriate, Query effective ?-Medications previously tried: Lantus (Formulary)  ?-Current home glucose readings ? Target 10/26-11/8 1/28-2/10 2/11-2/24 3/8-3/21  ?Number of days worn ? 14 days '14 14 14 14  '$ ?% of time active ? 70% 80% 50% 76% 83%  ?Mean Glucose  (mg/dL)  262 182 207 175  ?GMI  9.6% 7.7% 8.3% 7.5%  ?Glycemic Variability (%CV) ?36% 38.8% 46.2% 48.4% 30.7%  ?Time above >250 mg/dL <5% 49% 20% 35% 9%  ?Time above 70-180 mg/dL <25% 26% 26% 15% 38%  ?Time in range: 70-180 mg/dL >70% 25% 50% 47% 52%  ?Time below 70 mg/dL <4% 0% 4% 3% 1%  ?Time below 54 mg/dL <1% 0%   0%  ?-Dietary Patterns: Intermittent fasting.  ?-Exericse Patterns: Plans to restart weekly  ?-Wait until Endocrinology follow-up to increase Ozempic given significant side effects with lower dose, prefer slow titration schedule.   ?-Recommended to continue current medication ? ?Depression/Anxiety (Goal: Maintain stable mood and sleep) ?-Controlled ?-Current treatment: ?Alprazolam 2 mg three times daily as needed - Sleep  ?-Medications previously tried/failed: NA ?-PHQ9: 0 ?-GAD7: 0 ?-Educated on Benefits of medication for symptom control ?Benefits of cognitive-behavioral therapy with or without medication ?-Recommended to continue current medication  ? ?Renal Transplant  (Goal: prevent rejection of kidney ) ?-Managed by Dr. Holley Raring  ?-Controlled ?-Current treatment  ?Mycophenolate 500 mg 2 tablets twice daily  ?Tacrolimus 1 mg 3 capsules twice daily  ?-Medications previously tried: Myfortic (cost) ?-Drug interactions evaluated, no significant drug interactions noted with current regimen.  ?-Recommended to continue current medication ? ?Tobacco use (Goal Quit smoking) ?-Uncontrolled ?-Previous quit attempts: Chantix (nightmares), nicotine gum (ulcers), nicotine patch (stickiness) ?-Current treatment  ?1 ppd  ?-Patient smokes Within 30 minutes of waking ?-Patient triggers include: stress and finishing a meal ?-Patient in the preparation stage of quitting smoking.  ?-Patient's partner also smokes, he states both are committed to trying to reduce/quit smoking.  ?-Recommend Nicotine Patch 21 mg/24 hr for 6 weeks + Nicotine lozenge 4 mg as needed for breakthrough cravings.  ?-Counseled on patch placement,  side effects, and option to remove at night if they experience trouble sleeping or bad dreams.  Provided contact information for  Quit Line (1-800-QUIT-NOW) and encouraged patient to reach out to this group for support. ?-Patient to work on cutting down cigarette use while working on getting cessation supplies.  ? ?Chronic Kidney Disease Stage 3a  ?-All medications assessed for renal dosing and appropriateness in chronic kidney disease. ?-Next follow-up with nephrology 06/14/21. ?-Recommended to continue current medication ? ?Patient Goals/Self-Care Activities ?Over the next 90 days, patient will:  ?- check glucose 2-3 times daily , document, and provide at future appointments ?-check blood pressure 2-3 times weekly , document, and provide at future appointments ?-decrease cigarette use  ? ?Follow Up Plan: Telephone follow up appointment with care management team member scheduled for:  07/31/2021 at 2:00 PM ?  ? ? ? ?Patient agreed to services and verbal consent obtained.  ? ?Patient verbalizes understanding of instructions and care plan provided today and agrees to view in [place]. Active MyChart status confirmed with patient.   ? ?[name], PharmD, BCACP, [name]  ?Clinical Pharmacist Practitioner  ?  [place] ?[phone]  ?

## 2021-07-20 ENCOUNTER — Other Ambulatory Visit: Payer: Self-pay | Admitting: Cardiovascular Disease

## 2021-07-23 DIAGNOSIS — N1832 Chronic kidney disease, stage 3b: Secondary | ICD-10-CM

## 2021-07-23 DIAGNOSIS — F17210 Nicotine dependence, cigarettes, uncomplicated: Secondary | ICD-10-CM | POA: Diagnosis not present

## 2021-07-23 DIAGNOSIS — Z940 Kidney transplant status: Secondary | ICD-10-CM | POA: Diagnosis not present

## 2021-07-23 DIAGNOSIS — F32A Depression, unspecified: Secondary | ICD-10-CM | POA: Diagnosis not present

## 2021-07-23 DIAGNOSIS — E785 Hyperlipidemia, unspecified: Secondary | ICD-10-CM | POA: Diagnosis not present

## 2021-07-23 DIAGNOSIS — Z794 Long term (current) use of insulin: Secondary | ICD-10-CM

## 2021-07-23 DIAGNOSIS — E1122 Type 2 diabetes mellitus with diabetic chronic kidney disease: Secondary | ICD-10-CM

## 2021-07-23 DIAGNOSIS — I129 Hypertensive chronic kidney disease with stage 1 through stage 4 chronic kidney disease, or unspecified chronic kidney disease: Secondary | ICD-10-CM | POA: Diagnosis not present

## 2021-07-25 ENCOUNTER — Other Ambulatory Visit: Payer: Self-pay

## 2021-07-25 DIAGNOSIS — Z87891 Personal history of nicotine dependence: Secondary | ICD-10-CM

## 2021-07-25 DIAGNOSIS — F17210 Nicotine dependence, cigarettes, uncomplicated: Secondary | ICD-10-CM

## 2021-07-25 DIAGNOSIS — Z122 Encounter for screening for malignant neoplasm of respiratory organs: Secondary | ICD-10-CM

## 2021-07-26 ENCOUNTER — Other Ambulatory Visit: Payer: Self-pay

## 2021-07-26 DIAGNOSIS — L905 Scar conditions and fibrosis of skin: Secondary | ICD-10-CM

## 2021-07-26 DIAGNOSIS — M79604 Pain in right leg: Secondary | ICD-10-CM

## 2021-07-26 DIAGNOSIS — E1121 Type 2 diabetes mellitus with diabetic nephropathy: Secondary | ICD-10-CM | POA: Diagnosis not present

## 2021-07-26 NOTE — Telephone Encounter (Signed)
Requested medication (s) are due for refill today: Due 07/28/21 ? ?Requested medication (s) are on the active medication list: yes   ? ?Last refill: 06/28/21  #240  0 refills ? ?Future visit scheduled Appt with Pharmacist 07/31/21 ? ?Notes to clinic:Not delegated. ? ?Requested Prescriptions  ?Pending Prescriptions Disp Refills  ? oxyCODONE (OXY IR/ROXICODONE) 5 MG immediate release tablet 240 tablet 0  ?  Sig: Take 1-2 tablets (5-10 mg total) by mouth every 6 (six) hours as needed for severe pain.  ?  ? Not Delegated - Analgesics:  Opioid Agonists Failed - 07/26/2021 11:24 AM  ?  ?  Failed - This refill cannot be delegated  ?  ?  Failed - Urine Drug Screen completed in last 360 days  ?  ?  Passed - Valid encounter within last 3 months  ?  Recent Outpatient Visits   ? ?      ? 1 month ago Primary hypertension  ? [facility] Birdie Sons, MD  ? 9 months ago Type 2 diabetes mellitus with diabetic nephropathy, without long-term current use of insulin ([place])  ? [facility] Birdie Sons, MD  ? 1 year ago Type 2 diabetes mellitus with diabetic nephropathy, without long-term current use of insulin ([place])  ? [facility] Birdie Sons, MD  ? 1 year ago Type 2 diabetes mellitus with diabetic nephropathy, with long-term current use of insulin ([place])  ? [facility] Birdie Sons, MD  ? 1 year ago Type 2 diabetes mellitus with diabetic nephropathy, without long-term current use of insulin ([place])  ? [facility] Caryn Section, Kirstie Peri, MD  ? ?  ?  ?Future Appointments   ? ?        ? In 1 week Vanga, Tally Due, MD [address]  ? In 4 months Fisher, Kirstie Peri, MD [facility]  ? ?  ? ? ?  ?  ?  ? ? ? ? ?

## 2021-07-26 NOTE — Telephone Encounter (Signed)
Pt called and stated that he would like a refill for oxyCODONE (OXY IR/ROXICODONE) 5 MG immediate release tablet. Sent to [facility] on [address] in [place]. ?

## 2021-07-27 DIAGNOSIS — Z794 Long term (current) use of insulin: Secondary | ICD-10-CM | POA: Diagnosis not present

## 2021-07-27 DIAGNOSIS — E785 Hyperlipidemia, unspecified: Secondary | ICD-10-CM | POA: Diagnosis not present

## 2021-07-27 DIAGNOSIS — E1169 Type 2 diabetes mellitus with other specified complication: Secondary | ICD-10-CM | POA: Diagnosis not present

## 2021-07-27 DIAGNOSIS — I152 Hypertension secondary to endocrine disorders: Secondary | ICD-10-CM | POA: Diagnosis not present

## 2021-07-27 DIAGNOSIS — E1159 Type 2 diabetes mellitus with other circulatory complications: Secondary | ICD-10-CM | POA: Diagnosis not present

## 2021-07-27 DIAGNOSIS — E1129 Type 2 diabetes mellitus with other diabetic kidney complication: Secondary | ICD-10-CM | POA: Diagnosis not present

## 2021-07-27 MED ORDER — OXYCODONE HCL 5 MG PO TABS: 5.0000 mg | ORAL_TABLET | Freq: Four times a day (QID) | ORAL | 0 refills | Status: DC | PRN

## 2021-07-28 ENCOUNTER — Telehealth: Payer: Self-pay

## 2021-07-28 NOTE — Progress Notes (Signed)
? ? ?  Chronic Care Management ?Pharmacy Assistant  ? ?Name: James Moreno  MRN: [id number] DOB: 1966-01-10 ? ?Patient called to be reminded of his telephone appointment with [name], [name] on 07/31/2021 @ 1400 ? ?No answer, left message of appointment date, time and type of appointment (either telephone or in person). Left message to have all medications, supplements, blood pressure and/or blood sugar logs available during appointment and to return call if need to reschedule. ? ?Star Rating Drug: ?Rosuvastatin 40 mg last filled on 07/20/2021 for a 90-Day supply via [place] ?Ozempic 0.25 mg patient receives this medication via a patient assistance program with [name] ?Losartan 100 mg last filled on 06/05/2021 for a 90-Day supply via [place] ?[name] 10 mg patient receives this medication via a patient assistance program with [name] ? ?Any gaps in medications fill history? No ? ?Care Gaps: ?HIV Screening ?Zoster Vaccine ?Tetanus/TDAP ?BP> 140/90 ? ?Lynann Bologna, CPA/CMA ?Clinical Pharmacist Assistant ?Phone: [phone]  ? ?

## 2021-07-31 ENCOUNTER — Ambulatory Visit (INDEPENDENT_AMBULATORY_CARE_PROVIDER_SITE_OTHER): Payer: Medicare HMO

## 2021-07-31 ENCOUNTER — Ambulatory Visit: Payer: Medicare HMO | Admitting: Gastroenterology

## 2021-07-31 DIAGNOSIS — E1121 Type 2 diabetes mellitus with diabetic nephropathy: Secondary | ICD-10-CM

## 2021-07-31 DIAGNOSIS — F17200 Nicotine dependence, unspecified, uncomplicated: Secondary | ICD-10-CM

## 2021-07-31 NOTE — Patient Instructions (Signed)
Visit Information ?It was great speaking with you today!  Please let me know if you have any questions about our visit. ? ? Goals Addressed   ? ?  ?  ?  ?  ? This Visit's Progress  ?  Monitor and Manage My Blood Sugar-Diabetes Type 2   On track  ?  Timeframe:  Long-Range Goal ?Priority:  High ?Start Date: 05/17/2020                            ?Expected End Date: 11/16/2021                      ? ?Follow Up within 30 days  ?  ?- check blood sugar at prescribed times ?- check blood sugar if I feel it is too high or too low ?- enter blood sugar readings and medication or insulin into daily log  ?  ?Why is this important?   ?Checking your blood sugar at home helps to keep it from getting very high or very low.  ?Writing the results in a diary or log helps the doctor know how to care for you.  ?Your blood sugar log should have the time, date and the results.  ?Also, write down the amount of insulin or other medicine that you take.  ?Other information, like what you ate, exercise done and how you were feeling, will also be helpful.   ?  ?Notes:  ?  ?  Quit Smoking   On track  ?  Recommend to continue efforts to reduce smoking habits until no longer smoking (Smoking Cessation literature attached to AVS). ? ?  ? ?  ? ?  ? ? ?Patient Care Plan: General Pharmacy (Adult)  ?  ? ?Problem Identified: Hypertension, Hyperlipidemia, Diabetes, Coronary Artery Disease, GERD, Anxiety, Tobacco use and History of Renal Transplant   ?Priority: High  ?  ? ?Long-Range Goal: Patient-Specific Goal   ?Start Date: 05/19/2020  ?Expected End Date: 12/12/2021  ?This Visit's Progress: On track  ?Recent Progress: On track  ?Priority: High  ?Note:   ?Current Barriers:  ?Unable to independently afford treatment regimen ?Unable to achieve control of Diabetes  ? ?Pharmacist Clinical Goal(s):  ?Over the next 90 days, patient will verbalize ability to afford treatment regimen ?achieve control of Diabetes as evidenced by A1c less than 7% through collaboration  with PharmD and provider.  ? ?Interventions: ?1:1 collaboration with Birdie Sons, MD regarding development and update of comprehensive plan of care as evidenced by provider attestation and co-signature ?Inter-disciplinary care team collaboration (see longitudinal plan of care) ?Comprehensive medication review performed; medication list updated in electronic medical record ? ?Hypertension (BP goal <130/80) ?-Controlled ?-Current treatment: ?Amlodipine 10 mg daily ?Carvedilol 6.25 mg twice daily   ?Losartan 100 mg daily   ?-Medications previously tried: NA  ?-Current home readings:  ?-Denies hypotensive/hypertensive symptoms ?-Recommended to continue current medication ? ?Hyperlipidemia: (LDL goal < 70) ?-History of PAD, CAD  ?-Controlled ?-Current treatment: ?Rosuvastatin 40 mg daily  ?-Medications previously tried: NA  ?-Educated on Importance of limiting foods high in cholesterol; ?-Recommended to continue current medication ? ?Diabetes (A1c goal <7%) ?-Uncontrolled ?-Managed by Dr. Honor Junes, next follow-up scheduled for May 4th.  ?-Current medications: ?[name] 10 mg daily: Appropriate, Query effective ?Novolog 15 units three times daily + 2 units for every 50 units above 150: Appropriate, Query effective  ?Ozempic 0.5 mg on Tuesdays: Appropriate, Query effective ?Tresiba 50  units daily (0.44 u/kg): Appropriate, Query effective ?-Medications previously tried: Lantus (Formulary)  ?-Current home glucose readings ? Target 1/28-2/10 2/11-2/24 3/8-3/21 4/25-5/8  ?Number of days worn ? 14 days '14 14 14 14  '$ ?% of time active ? 70% 50% 76% 83% 81%  ?Mean Glucose (mg/dL)  182 207 175 167  ?GMI  7.7% 8.3% 7.5% 7.3%  ?Glycemic Variability (%CV) ?36% 46.2% 48.4% 30.7% 45.1%  ?Time above >250 mg/dL <5% 20% 35% 9% 15%  ?Time above 70-180 mg/dL <25% 26% 15% 38% 18%  ?Time in range: 70-180 mg/dL >70% 50% 47% 52% 66%  ?Time below 70 mg/dL <4% 4% 3% 1% 1%  ?Time below 54 mg/dL <1%   0%   ?-Dietary Patterns: Intermittent  fasting.  ?-Exericse Patterns: Plans to restart weekly  ?-Up to date with eye and foot exams.  ?-Counseled patient on managing side effects of Ozempic treatment, when to contact team in the setting of hypoglycemia. ?-Recommended to continue current medication ? ?Depression/Anxiety (Goal: Maintain stable mood and sleep) ?-Controlled ?-Current treatment: ?Alprazolam 2 mg three times daily as needed - Sleep  ?-Medications previously tried/failed: NA ?-PHQ9: 0 ?-GAD7: 0 ?-Educated on Benefits of medication for symptom control ?Benefits of cognitive-behavioral therapy with or without medication ?-Recommended to continue current medication  ? ?Renal Transplant  (Goal: prevent rejection of kidney ) ?-Managed by Dr. Holley Raring  ?-Controlled ?-Current treatment  ?Mycophenolate 500 mg 2 tablets twice daily  ?Tacrolimus 1 mg 3 capsules twice daily  ?-Medications previously tried: Myfortic (cost) ?-Recommended to continue current medication ? ?Tobacco use (Goal Quit smoking) ?-Uncontrolled ?-Previous quit attempts: Chantix (nightmares), nicotine gum (ulcers), nicotine patch (stickiness) ?-Current treatment  ?17-18 cigarettes daily  ?-Patient smokes Within 30 minutes of waking ?-Patient triggers include: stress and finishing a meal ?-Patient in the preparation stage of quitting smoking. Patient set a quit date of May 31st. Patient planned to to start using the patch tomorrow, discussed having the patient's true quit date be the same date he starts using the patch.    ?-Patient's partner also smokes, he states both are committed to trying to reduce/quit smoking.  ?-START Nicotine Patch 21 mg/24 hr for 6 weeks + Nicotine lozenge 4 mg as needed for breakthrough cravings.  ?-Counseled on patch placement, side effects, and option to remove at night if they experience trouble sleeping or bad dreams.   ?-Counseled on park & chew method for NRT gum. ?-HC to assess smoking cessation in 1-2 weeks  ? ?Chronic Kidney Disease Stage 3a  ?-All  medications assessed for renal dosing and appropriateness in chronic kidney disease. ?-Recommended to continue current medication ? ?Patient Goals/Self-Care Activities ?Over the next 90 days, patient will:  ?- check glucose 2-3 times daily , document, and provide at future appointments ?-check blood pressure 2-3 times weekly , document, and provide at future appointments ?-decrease cigarette use  ? ?Follow Up Plan: Telephone follow up appointment with care management team member scheduled for:  09/04/2021 at 2:00 PM ?  ? ?Patient agreed to services and verbal consent obtained.  ? ?Patient verbalizes understanding of instructions and care plan provided today and agrees to view in [facility]. Active MyChart status confirmed with patient.   ? ?[name], PharmD, BCACP, [name]  ?Clinical Pharmacist Practitioner  ?[place] ?[phone]  ?

## 2021-07-31 NOTE — Progress Notes (Addendum)
? ?Chronic Care Management ?Pharmacy Note ? ?07/31/2021 ?Name:  James Moreno MRN:  [id number] DOB:  1965/12/12 ? ?Summary: ?Patient presents for CCM follow-up. Patient in the preparation stage of quitting smoking. Patient set a quit date of May 31st, but plans to start using nicotine patch + gum in the next day.  ? ?Recommendations/Changes made from today's visit: ?-HC to assess smoking cessation in 1-2 weeks ?-START Nicotine Patch 21 mg/24 hr for 6 weeks + Nicotine lozenge 4 mg as needed for breakthrough cravings. ? ?Plan: ?[name] follow-up 1 month ? ?Subjective: ?James Moreno is an 56 y.o. year old male who is a primary patient of Fisher, Kirstie Peri, MD.  The CCM team was consulted for assistance with disease management and care coordination needs.   ? ?Engaged with patient by telephone for follow up visit in response to provider referral for pharmacy case management and/or care coordination services.  ? ?Consent to Services:  ?The patient was given information about Chronic Care Management services, agreed to services, and gave verbal consent prior to initiation of services.  Please see initial visit note for detailed documentation.  ? ?Patient Care Team: ?Birdie Sons, MD as PCP - General (Family Medicine) ?Minna Merritts, MD as PCP - Cardiology (Cardiology) ?Pa, [facility]) ?Anthonette Legato, MD (Nephrology) ?Long, Thomes Cake, MD as Referring Physician (Vascular Surgery) ?Isaias Sakai, MD as Referring Physician (Ophthalmology) ?Lin Landsman, MD as Consulting Physician (Gastroenterology) ?Wellington Hampshire, MD as Consulting Physician (Cardiology) ?Germaine Pomfret, [facility] (Pharmacist) ?Hyatt, Max T, DPM as Veterinary surgeon) ? ?Recent office visits: ?06/20/20: Patient presented to Dr. Caryn Section for follow-up. Chantix stopped, patient started on Nicotine Patch 21 mg daily.  ? ?Recent consult visits: ?07/27/21: Patient presented to Dr. Honor Junes (endocrinology). BP  110/70. Ozempic 0.5 mg weekly.  ?07/10/21: Patient presented to Dr. Holley Raring (Nephrology).  ? ?Hospital visits: ?None in previous 6 months ? ?Objective: ? ?Lab Results  ?Component Value Date  ? CREATININE 1.72 (H) 06/20/2020  ? BUN 29 (H) 06/20/2020  ? GFRNONAA 39 (L) 09/17/2019  ? GFRAA 45 (L) 09/17/2019  ? NA 139 06/20/2020  ? K 4.2 06/20/2020  ? CALCIUM 10.7 (H) 06/20/2020  ? CO2 21 06/20/2020  ? ? ?Lab Results  ?Component Value Date/Time  ? HGBA1C 8.1 02/28/2021 12:00 AM  ? HGBA1C 8.8 05/25/2020 12:00 AM  ? MICROALBUR 100 12/31/2016 02:13 PM  ?  ?Last diabetic Eye exam:  ?Lab Results  ?Component Value Date/Time  ? HMDIABEYEEXA Retinopathy (A) 06/14/2021 12:00 AM  ?  ?Last diabetic Foot exam: No results found for: HMDIABFOOTEX  ? ?Lab Results  ?Component Value Date  ? CHOL 141 06/20/2020  ? HDL 46 06/20/2020  ? [place] 53 06/20/2020  ? TRIG 268 (H) 06/20/2020  ? CHOLHDL 3.1 06/20/2020  ? ? ? ?  Latest Ref Rng & Units 06/20/2020  ?  9:03 AM 09/17/2019  ?  2:17 PM 12/12/2018  ? 12:00 AM  ?Hepatic Function  ?Total Protein 6.0 - 8.5 g/dL  6.5     ?Albumin 3.8 - 4.9 g/dL 4.2   4.3     ?AST 0 - 40 IU/L  9   14       ?ALT 0 - 44 IU/L  9   16       ?Alk Phosphatase 48 - 121 IU/L  131   95       ?Total Bilirubin 0.0 - 1.2 mg/dL  0.6     ?  ?  This result is from an external source.  ? ? ?Lab Results  ?Component Value Date/Time  ? TSH 1.34 12/12/2018 12:00 AM  ? TSH 1.72 06/13/2006 12:00 AM  ? ? ? ?  Latest Ref Rng & Units 12/12/2018  ? 12:00 AM 10/21/2015  ?  1:10 PM 06/21/2013  ?  2:29 PM  ?CBC  ?WBC  6.7      8.9   9.7    ?Hemoglobin 13.5 - 17.5 14.8      16.3   10.1    ?Hematocrit 41 - 53 43      46.7   28.9    ?Platelets 150 - 399 182      140   175    ?  ? This result is from an external source.  ? ? ?No results found for: VD25OH ? ?Clinical ASCVD: Yes  ?The 10-year ASCVD risk score (Arnett DK, et al., 2019) is: 13.6% ?  Values used to calculate the score: ?    Age: 56 years ?    Sex: Male ?    Is Non-Hispanic African  American: No ?    Diabetic: Yes ?    Tobacco smoker: Yes ?    Systolic Blood Pressure: 509 mmHg ?    Is BP treated: Yes ?    HDL Cholesterol: 46 mg/dL ?    Total Cholesterol: 141 mg/dL   ? ? ?  06/13/2021  ? 10:27 AM 02/27/2021  ? 10:59 AM 10/14/2020  ?  3:41 PM  ?Depression screen PHQ 2/9  ?Decreased Interest 0 0 0  ?Down, Depressed, Hopeless 0 0 0  ?PHQ - 2 Score 0 0 0  ?Altered sleeping 0  0  ?Tired, decreased energy 0  0  ?Change in appetite 0  0  ?Feeling bad or failure about yourself  0  0  ?Trouble concentrating 0  0  ?Moving slowly or fidgety/restless 0  0  ?Suicidal thoughts 0  0  ?PHQ-9 Score 0  0  ?Difficult doing work/chores Not difficult at all  Not difficult at all  ?  ? ? ?Social History  ? ?Tobacco Use  ?Smoking Status Every Day  ? Packs/day: 0.75  ? Years: 38.00  ? Pack years: 28.50  ? Types: Cigarettes  ?Smokeless Tobacco Never  ?Tobacco Comments  ? since age 45.  ? ?BP Readings from Last 3 Encounters:  ?06/13/21 (!) 151/89  ?03/30/21 (!) 155/89  ?10/21/20 140/60  ? ?Pulse Readings from Last 3 Encounters:  ?06/13/21 90  ?03/30/21 97  ?10/21/20 89  ? ?Wt Readings from Last 3 Encounters:  ?06/13/21 232 lb (105.2 kg)  ?03/30/21 230 lb 2 oz (104.4 kg)  ?10/21/20 224 lb 2 oz (101.7 kg)  ? ? ?Assessment/Interventions: Review of patient past medical history, allergies, medications, health status, including review of consultants reports, laboratory and other test data, was performed as part of comprehensive evaluation and provision of chronic care management services.  ? ?SDOH:  (Social Determinants of Health) assessments and interventions performed: No; completed in Mar 2023.  ? ?CCM Care Plan ? ?Allergies  ?Allergen Reactions  ? No Known Allergies   ? ? ?Medications Reviewed Today   ? ? Reviewed by Randal Buba, CMA (Certified Medical Assistant) on 06/13/21 at 57  Med List Status: <None>  ? ?Medication Order Taking? Sig Documenting Provider Last Dose Status Informant  ?alprazolam (XANAX) 2 MG  tablet [id number] Yes Take 1 tablet (2 mg total) by mouth 3 (three) times daily  as needed for sleep. Birdie Sons, MD Taking Active   ?amLODipine (NORVASC) 10 MG tablet [id number] Yes Take 10 mg by mouth daily.  [provider] Taking Active   ?amLODipine (NORVASC) 10 MG tablet [id number] Yes Take 1 tablet (10 mg total) by mouth daily. Birdie Sons, MD Taking Active   ?B-D ULTRAFINE III SHORT PEN 31G X 8 MM MISC [id number] Yes  [provider] Taking Active   ?BD INSULIN SYRINGE U/F 31G X 5/16" 1 ML MISC [id number] Yes  [provider] Taking Active   ?Blood Glucose Monitoring Suppl ([place]) DEVI [id number] Yes Frequency:ONCE   Dosage:0.0     Instructions:  Note:Dose: N/A [provider] Taking Active Self  ?         ?Med Note Jeanie Cooks Oct 30, 2017 11:50 AM)    ?calcitRIOL (ROCALTROL) 0.25 MCG capsule [id number] Yes Take 1 capsule (0.25 mcg total) by mouth daily. Birdie Sons, MD Taking Active   ?carvedilol (COREG) 12.5 MG tablet [id number] Yes Take 12.5 mg by mouth 2 (two) times daily. [provider] Taking Active   ?cinacalcet (SENSIPAR) 30 MG tablet [id number] Yes Take 1 tablet (30 mg total) by mouth daily. Birdie Sons, MD Taking Active   ?dapagliflozin propanediol ([name]) 10 MG TABS tablet [id number] Yes Take 1 tablet (10 mg total) by mouth daily before breakfast. Patient receives through [name] through Dec 2023 Birdie Sons, MD Taking Active   ?EDEX 40 MCG injection [id number] Yes 40 mcg by Intracavitary route as needed.  [provider] Taking Active   ?fenofibrate (TRICOR) 145 MG tablet [id number] Yes  [provider] Taking Active   ?insulin aspart (NOVOLOG) 100 UNIT/ML injection [id number] Yes Inject 15 Units into the skin 3 (three) times daily before meals. [provider] Taking Active Self  ?         ?Med Note Germaine Pomfret   Mon Aug 15, 2020  3:50 PM) Frances Maywood through [employer] Patient Assistance through Dec 2022 ?  ?insulin degludec (TRESIBA FLEXTOUCH) 100 UNIT/ML FlexTouch Pen [id number] Yes Inject 45 Units into the skin daily. [provider] Taking Active   ?         ?Med Note

## 2021-08-01 ENCOUNTER — Other Ambulatory Visit: Payer: Self-pay

## 2021-08-02 ENCOUNTER — Ambulatory Visit: Payer: Medicare HMO | Admitting: Gastroenterology

## 2021-08-03 ENCOUNTER — Other Ambulatory Visit: Payer: Self-pay | Admitting: Family Medicine

## 2021-08-03 NOTE — Telephone Encounter (Signed)
Requested medication (s) are due for refill today: yes ? ?Requested medication (s) are on the active medication list: yes ? ?Last refill:  04/11/21 #30 ? ?Future visit scheduled: no ? ?Notes to clinic:  med not delegated to NT to RF ? ? ?Requested Prescriptions  ?Pending Prescriptions Disp Refills  ? alprazolam (XANAX) 2 MG tablet 30 tablet 0  ?  Sig: Take 1 tablet (2 mg total) by mouth 3 (three) times daily as needed for sleep.  ?  ? Not Delegated - Psychiatry: Anxiolytics/Hypnotics 2 Failed - 08/03/2021  1:46 PM  ?  ?  Failed - This refill cannot be delegated  ?  ?  Failed - Urine Drug Screen completed in last 360 days  ?  ?  Passed - Patient is not pregnant  ?  ?  Passed - Valid encounter within last 6 months  ?  Recent Outpatient Visits   ? ?      ? 1 month ago Primary hypertension  ? [facility] Birdie Sons, MD  ? 9 months ago Type 2 diabetes mellitus with diabetic nephropathy, without long-term current use of insulin ([place])  ? [facility] Birdie Sons, MD  ? 1 year ago Type 2 diabetes mellitus with diabetic nephropathy, without long-term current use of insulin ([place])  ? [facility] Birdie Sons, MD  ? 1 year ago Type 2 diabetes mellitus with diabetic nephropathy, with long-term current use of insulin ([place])  ? [facility] Birdie Sons, MD  ? 1 year ago Type 2 diabetes mellitus with diabetic nephropathy, without long-term current use of insulin ([place])  ? [facility] Caryn Section, Kirstie Peri, MD  ? ?  ?  ?Future Appointments   ? ?        ? In 4 months Fisher, Kirstie Peri, MD [facility], [facility]  ? ?  ? ? ?  ?  ?  ? ? ? ? ?

## 2021-08-03 NOTE — Telephone Encounter (Signed)
Medication Refill - Medication: alprazolam Duanne Moron) 2 MG tablet  Has the patient contacted their pharmacy? Yes.    (Agent: If no, request that the patient contact the pharmacy for the refill. If patient does not wish to contact the pharmacy document the reason why and proceed with request.) (Agent: If yes, when and what did the pharmacy advise?)  Preferred Pharmacy (with phone number or street name): [facility], [place]  Has the patient been seen for an appointment in the last year OR does the patient have an upcoming appointment? Yes.    Agent: Please be advised that RX refills may take up to 3 business days. We ask that you follow-up with your pharmacy.

## 2021-08-07 ENCOUNTER — Other Ambulatory Visit: Payer: Self-pay | Admitting: Family Medicine

## 2021-08-07 MED ORDER — ALPRAZOLAM 2 MG PO TABS: 2.0000 mg | ORAL_TABLET | Freq: Three times a day (TID) | ORAL | 5 refills | Status: DC | PRN

## 2021-08-10 ENCOUNTER — Telehealth: Payer: Self-pay

## 2021-08-10 NOTE — Progress Notes (Signed)
Chronic Care Management Pharmacy Assistant   Name: James Moreno  MRN: [id number] DOB: Jan 13, 1966  Reason for Encounter: Follow-up with patient on where he is with his goal of quitting smoking  Recent office visits:  None ID  Recent consult visits:  None ID  Hospital visits:  None in previous 6 months  Medications: Outpatient Encounter Medications as of 08/10/2021  Medication Sig Note   alprazolam (XANAX) 2 MG tablet Take 1 tablet (2 mg total) by mouth 3 (three) times daily as needed for sleep.    amLODipine (NORVASC) 10 MG tablet Take 10 mg by mouth daily.     amLODipine (NORVASC) 10 MG tablet Take 1 tablet (10 mg total) by mouth daily.    B-D ULTRAFINE III SHORT PEN 31G X 8 MM MISC     BD INSULIN SYRINGE U/F 31G X 5/16" 1 ML MISC     Blood Glucose Monitoring Suppl (GLUCOCOM BLOOD GLUCOSE MONITOR) DEVI Frequency:ONCE   Dosage:0.0     Instructions:  Note:Dose: N/A    calcitRIOL (ROCALTROL) 0.25 MCG capsule Take 1 capsule (0.25 mcg total) by mouth daily.    carvedilol (COREG) 12.5 MG tablet Take 12.5 mg by mouth 2 (two) times daily.    cinacalcet (SENSIPAR) 30 MG tablet Take 1 tablet (30 mg total) by mouth daily.    dapagliflozin propanediol ([name]) 10 MG TABS tablet Take 1 tablet (10 mg total) by mouth daily before breakfast. Patient receives through [name] through Dec 2023    EDEX 40 MCG injection 40 mcg by Intracavitary route as needed.     fenofibrate (TRICOR) 145 MG tablet     insulin aspart (NOVOLOG) 100 UNIT/ML injection Inject 15 Units into the skin 3 (three) times daily before meals. 5/23/2022Frances Maywood through [name] Patient Assistance through Dec 2022    insulin degludec (TRESIBA FLEXTOUCH) 100 UNIT/ML FlexTouch Pen Inject 45 Units into the skin daily. 5/23/2022Frances Maywood through [name] Patient Assistance through Dec 2022   Lancets [facility] DELICA PLUS [id number]) MISC Use to monitor blood sugars four times daily as directed. DX E11.21, Z79.4    LANTUS  SOLOSTAR 100 UNIT/ML Solostar Pen     losartan (COZAAR) 100 MG tablet Take 1 tablet (100 mg total) by mouth at bedtime.    naloxone (NARCAN) nasal spray 4 mg/0.1 mL Place 1 spray into the nose once.     nicotine (NICODERM CQ) 14 mg/24hr patch Place 1 patch (14 mg total) onto the skin daily. (Patient not taking: Reported on 06/13/2021)    NOVOLOG 100 UNIT/ML injection     omeprazole (PRILOSEC) 20 MG capsule Take by mouth.    ONETOUCH VERIO test strip Use to monitor blood sugars four times daily as directed. DX E11.21, Z79.4    oxyCODONE (OXY IR/ROXICODONE) 5 MG immediate release tablet Take 1-2 tablets (5-10 mg total) by mouth every 6 (six) hours as needed for severe pain.    rosuvastatin (CRESTOR) 40 MG tablet TAKE 1 TABLET EVERY DAY    Semaglutide,0.25 or 0.'5MG'$ /DOS, (OZEMPIC, 0.25 OR 0.5 MG/DOSE,) 2 MG/1.5ML SOPN Inject 0.25 mg into the skin for four weeks, then inject 0.5 mg into the skin weekly.    sildenafil (REVATIO) 20 MG tablet     tacrolimus (PROGRAF) 1 MG capsule Take 3 mg by mouth 2 (two) times daily.     tretinoin (RETIN-A) 0.05 % cream Apply topically as needed.     triamcinolone cream (KENALOG) 0.1 % APPLY DAILY TO INFLAMED BUMPS AS NEEDED  XYOSTED 50 MG/0.5ML SOAJ     No facility-administered encounter medications on file as of 08/10/2021.   Care Gaps: HIV Screening Zoster Vaccine Tetanus/TDAP BP> 140/90  Star Rating Drugs: Rosuvastatin 40 mg last filled on 07/20/2021 for a 90-Day supply via [place] 0.25 mg patient receives this medication via a patient assistance program with [name] Losartan 100 mg last filled on 06/05/2021 for a 90-Day supply via [place] 10 mg patient receives this medication via a patient assistance program with [name]   I received a task from [name], [name] requesting that I do a follow-up with patient to access where he is with his goal to stop smoking.   [name] would like to know:  If the patient is  experiencing smoking cravings? Yes as he has not really started the process of stopping per patient he is out of town this week and still smoking a pack a day. He plans to start cutting back when he get's home on Sunday.  If the patient is using nicotine patches/gum? Patient is not using the patch nor the gum at this time. Per patient the provider would only like him to use one of the products not both of them. Patient stated he is going to start on Sunday with one of the products.  If he is using the patches or the gum is he experiencing any side effects? N/A patient is not using any products at this time.  Do you currently use any tobacco products? Yes patient still is smoking  How many cigarettes a day are you smoking? 1 pack daily  Per the patient due to him being out of town he is still smoking, and he knew it would be hard for him to quit due to being on vacation. Patient reports that he is going to try to start with the process of stopping on Sunday when he returns home, and stating he still has the goal of stopping by 08/23/2021.  Patient requested I follow back up with him on 08/18/2021 to check where he is with his goal.   Lynann Bologna, CPA/CMA Clinical Pharmacist Assistant Phone: [phone]

## 2021-08-14 ENCOUNTER — Telehealth: Payer: Self-pay | Admitting: Family Medicine

## 2021-08-14 MED ORDER — ALPRAZOLAM 2 MG PO TABS: 2.0000 mg | ORAL_TABLET | Freq: Three times a day (TID) | ORAL | 5 refills | Status: DC | PRN

## 2021-08-14 NOTE — Telephone Encounter (Signed)
Please advise 

## 2021-08-14 NOTE — Telephone Encounter (Signed)
Pt called in for assistance. Pt says that he was given a partial Rx of alprazolam (XANAX) 2 MG tablet  before and since he has been receiving the partial Rx. Pt says that he would like to get back to his full Rx. Pt would like further assistance with this Rx.    CB: [phone] - please assist pt further.

## 2021-08-18 ENCOUNTER — Telehealth: Payer: Self-pay

## 2021-08-18 NOTE — Progress Notes (Unsigned)
Chronic Care Management Pharmacy Assistant   Name: James Moreno  MRN: [id number] DOB: [birth year]  Reason for Encounter: Tobacco Assessment   Recent office visits:  None ID  Recent consult visits:  None ID  Hospital visits:  None in previous 6 months  Medications: Outpatient Encounter Medications as of 08/18/2021  Medication Sig Note   alprazolam (XANAX) 2 MG tablet Take 1 tablet (2 mg total) by mouth 3 (three) times daily as needed for sleep.    amLODipine (NORVASC) 10 MG tablet Take 10 mg by mouth daily.     amLODipine (NORVASC) 10 MG tablet Take 1 tablet (10 mg total) by mouth daily.    B-D ULTRAFINE III SHORT PEN 31G X 8 MM MISC     BD INSULIN SYRINGE U/F 31G X 5/16" 1 ML MISC     Blood Glucose Monitoring Suppl (GLUCOCOM BLOOD GLUCOSE MONITOR) DEVI Frequency:ONCE   Dosage:0.0     Instructions:  Note:Dose: N/A    calcitRIOL (ROCALTROL) 0.25 MCG capsule Take 1 capsule (0.25 mcg total) by mouth daily.    carvedilol (COREG) 12.5 MG tablet Take 12.5 mg by mouth 2 (two) times daily.    cinacalcet (SENSIPAR) 30 MG tablet Take 1 tablet (30 mg total) by mouth daily.    dapagliflozin propanediol ([name]) 10 MG TABS tablet Take 1 tablet (10 mg total) by mouth daily before breakfast. Patient receives through [name] through Dec 2023    EDEX 40 MCG injection 40 mcg by Intracavitary route as needed.     fenofibrate (TRICOR) 145 MG tablet     insulin aspart (NOVOLOG) 100 UNIT/ML injection Inject 15 Units into the skin 3 (three) times daily before meals. 5/23/2022Frances [place] through [name] Patient Assistance through Dec 2022    insulin degludec (TRESIBA FLEXTOUCH) 100 UNIT/ML FlexTouch Pen Inject 45 Units into the skin daily. 5/23/2022Frances [place] through [name] Patient Assistance through Dec 2022   Lancets [facility] DELICA PLUS [id number]) MISC Use to monitor blood sugars four times daily as directed. DX E11.21, Z79.4    LANTUS SOLOSTAR 100 UNIT/ML Solostar Pen     losartan  (COZAAR) 100 MG tablet Take 1 tablet (100 mg total) by mouth at bedtime.    naloxone (NARCAN) nasal spray 4 mg/0.1 mL Place 1 spray into the nose once.     nicotine (NICODERM CQ) 14 mg/24hr patch Place 1 patch (14 mg total) onto the skin daily. (Patient not taking: Reported on 06/13/2021)    NOVOLOG 100 UNIT/ML injection     omeprazole (PRILOSEC) 20 MG capsule Take by mouth.    ONETOUCH VERIO test strip Use to monitor blood sugars four times daily as directed. DX E11.21, Z79.4    oxyCODONE (OXY IR/ROXICODONE) 5 MG immediate release tablet Take 1-2 tablets (5-10 mg total) by mouth every 6 (six) hours as needed for severe pain.    rosuvastatin (CRESTOR) 40 MG tablet TAKE 1 TABLET EVERY DAY    Semaglutide,0.25 or 0.'5MG'$ /DOS, (OZEMPIC, 0.25 OR 0.5 MG/DOSE,) 2 MG/1.5ML SOPN Inject 0.25 mg into the skin for four weeks, then inject 0.5 mg into the skin weekly.    sildenafil (REVATIO) 20 MG tablet     tacrolimus (PROGRAF) 1 MG capsule Take 3 mg by mouth 2 (two) times daily.     tretinoin (RETIN-A) 0.05 % cream Apply topically as needed.     triamcinolone cream (KENALOG) 0.1 % APPLY DAILY TO INFLAMED BUMPS AS NEEDED    XYOSTED 50 MG/0.5ML SOAJ     No  facility-administered encounter medications on file as of 08/18/2021.    Care Gaps: HIV Screening Zoster Vaccine Tetanus/TDAP BP> 140/90   Star Rating Drugs: Rosuvastatin 40 mg last filled on 07/20/2021 for a 90-Day supply via [place] 0.25 mg patient receives this medication via a patient assistance program with [name] Losartan 100 mg last filled on 06/05/2021 for a 90-Day supply via [place] 10 mg patient receives this medication via a patient assistance program with [name]    I received a task from [name], [name] requesting that I do a follow-up with patient to access where he is with his goal to stop smoking.    [name] would like to know:   If the patient is experiencing smoking cravings?    If the  patient is using nicotine patches/gum?   If he is using the patches or the gum is he experiencing any side effects?    Do you currently use any tobacco products?    How many cigarettes a day are you smoking?     If not smoking:   Have you had any cravings for cigarettes in the last 2 weeks?  How do you manage your cravings for cigarettes when they arise?   Is there anything we can do to help support you?  05/26 Left HIPAA compliant VM requesting the patient to return my call 06/01 Patient never returned my call I was unable to speak with him regarding his goal   Lynann Bologna, [place] Pharmacist Assistant Phone: [phone]

## 2021-08-22 ENCOUNTER — Ambulatory Visit (INDEPENDENT_AMBULATORY_CARE_PROVIDER_SITE_OTHER): Payer: Medicare HMO | Admitting: Acute Care

## 2021-08-22 ENCOUNTER — Encounter: Payer: Self-pay | Admitting: Acute Care

## 2021-08-22 ENCOUNTER — Telehealth: Payer: Self-pay

## 2021-08-22 DIAGNOSIS — F17210 Nicotine dependence, cigarettes, uncomplicated: Secondary | ICD-10-CM | POA: Diagnosis not present

## 2021-08-22 NOTE — Patient Instructions (Signed)
Thank you for participating in the [facility] Lung Cancer Screening Program. It was our pleasure to meet you today. We will call you with the results of your scan within the next few days. Your scan will be assigned a Lung RADS category score by the physicians reading the scans.  This Lung RADS score determines follow up scanning.  See below for description of categories, and follow up screening recommendations. We will be in touch to schedule your follow up screening annually or based on recommendations of our providers. We will fax a copy of your scan results to your Primary Care Physician, or the physician who referred you to the program, to ensure they have the results. Please call the office if you have any questions or concerns regarding your scanning experience or results.  Our office number is [phone]. Please speak with Denise Phelps, RN. , or  Denise Buckner RN, They are  our Lung Cancer Screening RN.'s If They are unavailable when you call, Please leave a message on the voice mail. We will return your call at our earliest convenience.This voice mail is monitored several times a day.  Remember, if your scan is normal, we will scan you annually as long as you continue to meet the criteria for the program. (Age 55-77, Current smoker or smoker who has quit within the last 15 years). If you are a smoker, remember, quitting is the single most powerful action that you can take to decrease your risk of lung cancer and other pulmonary, breathing related problems. We know quitting is hard, and we are here to help.  Please let us know if there is anything we can do to help you meet your goal of quitting. If you are a former smoker, congratulations. We are proud of you! Remain smoke free! Remember you can refer friends or family members through the number above.  We will screen them to make sure they meet criteria for the program. Thank you for helping us take better care of you by  participating in Lung Screening.  You can receive free nicotine replacement therapy ( patches, gum or mints) by calling 1-800-QUIT NOW. Please call so we can get you on the path to becoming  a non-smoker. I know it is hard, but you can do this!  Lung RADS Categories:  Lung RADS 1: no nodules or definitely non-concerning nodules.  Recommendation is for a repeat annual scan in 12 months.  Lung RADS 2:  nodules that are non-concerning in appearance and behavior with a very low likelihood of becoming an active cancer. Recommendation is for a repeat annual scan in 12 months.  Lung RADS 3: nodules that are probably non-concerning , includes nodules with a low likelihood of becoming an active cancer.  Recommendation is for a 6-month repeat screening scan. Often noted after an upper respiratory illness. We will be in touch to make sure you have no questions, and to schedule your 6-month scan.  Lung RADS 4 A: nodules with concerning findings, recommendation is most often for a follow up scan in 3 months or additional testing based on our provider's assessment of the scan. We will be in touch to make sure you have no questions and to schedule the recommended 3 month follow up scan.  Lung RADS 4 B:  indicates findings that are concerning. We will be in touch with you to schedule additional diagnostic testing based on our provider's  assessment of the scan.  Other options for assistance in smoking cessation (   As covered by your insurance benefits)  Hypnosis for smoking cessation  [employer] [phone]  Acupuncture for smoking cessation  [facility] [phone]   

## 2021-08-22 NOTE — Progress Notes (Signed)
Virtual Visit via Telephone Note  I connected with James Moreno on 08/22/21 at  2:00 PM EDT by telephone and verified that I am speaking with the correct person using two identifiers.  Location: Patient: At home Provider: [place], [place], [place], [address]    I discussed the limitations, risks, security and privacy concerns of performing an evaluation and management service by telephone and the availability of in person appointments. I also discussed with the patient that there may be a patient responsible charge related to this service. The patient expressed understanding and agreed to proceed.   Shared Decision Making Visit Lung Cancer Screening Program [phone])   Eligibility: Age 56 y.o. Pack Years Smoking History Calculation 41 pack year smoking history (# packs/per year x # years smoked) Recent History of coughing up blood  no Unexplained weight loss? no ( >Than 15 pounds within the last 6 months ) Prior History Lung / other cancer no (Diagnosis within the last 5 years already requiring surveillance chest CT Scans). Smoking Status Current Smoker Former Smokers: Years since quit:  NA  Quit Date:  NA  Visit Components: Discussion included one or more decision making aids. yes Discussion included risk/benefits of screening. yes Discussion included potential follow up diagnostic testing for abnormal scans. yes Discussion included meaning and risk of over diagnosis. yes Discussion included meaning and risk of False Positives. yes Discussion included meaning of total radiation exposure. yes  Counseling Included: Importance of adherence to annual lung cancer LDCT screening. yes Impact of comorbidities on ability to participate in the program. yes Ability and willingness to under diagnostic treatment. yes  Smoking Cessation Counseling: Current Smokers:  Discussed importance of smoking cessation. yes Information about tobacco cessation classes and interventions  provided to patient. yes Patient provided with "ticket" for LDCT Scan. yes Symptomatic Patient. no  Counseling NA Diagnosis Code: Tobacco Use Z72.0 Asymptomatic Patient yes  Counseling (Intermediate counseling: > three minutes counseling) [id number] Former Smokers:  Discussed the importance of maintaining cigarette abstinence. yes Diagnosis Code: Personal History of Nicotine Dependence. V03.500 Information about tobacco cessation classes and interventions provided to patient. Yes Patient provided with "ticket" for LDCT Scan. yes Written Order for Lung Cancer Screening with LDCT placed in Epic. Yes (CT Chest Lung Cancer Screening Low Dose W/O CM) [id number] Z12.2-Screening of respiratory organs Z87.891-Personal history of nicotine dependence  I have spent 25 minutes of face to face/ virtual visit   time with  James Moreno discussing the risks and benefits of lung cancer screening. We viewed / discussed a power point together that explained in detail the above noted topics. We paused at intervals to allow for questions to be asked and answered to ensure understanding.We discussed that the single most powerful action that he can take to decrease his risk of developing lung cancer is to quit smoking. We discussed whether or not he is ready to commit to setting a quit date. He has set a quit date and he has called 1-800 QUIT NOW for nicotine replacement therapy. We discussed options for tools to aid in quitting smoking including nicotine replacement therapy, non-nicotine medications, support groups, Quit Smart classes, and behavior modification. We discussed that often times setting smaller, more achievable goals, such as eliminating 1 cigarette a day for a week and then 2 cigarettes a day for a week can be helpful in slowly decreasing the number of cigarettes smoked. This allows for a sense of accomplishment as well as providing a clinical benefit. I provided  him   with smoking cessation  information  with  contact information for community resources, classes, free nicotine replacement therapy, and access to mobile apps, text messaging, and on-line smoking cessation help. I have also provided  him  the office contact information in the event he  needs to contact me, or the screening staff. We discussed the time and location of the scan, and that either Doroteo Glassman RN, Joella Prince, RN  or I will call / send a letter with the results within 24-72 hours of receiving them. The patient verbalized understanding of all of  the above and had no further questions upon leaving the office. They have my contact information in the event they have any further questions.  I spent 3 minutes counseling on smoking cessation and the health risks of continued tobacco abuse.  I explained to the patient that there has been a high incidence of coronary artery disease noted on these exams. I explained that this is a non-gated exam therefore degree or severity cannot be determined. This patient is not on statin therapy. I have asked the patient to follow-up with their PCP regarding any incidental finding of coronary artery disease and management with diet or medication as their PCP  feels is clinically indicated. The patient verbalized understanding of the above and had no further questions upon completion of the visit.      Magdalen Spatz, NP 08/22/2021

## 2021-08-22 NOTE — Progress Notes (Signed)
Per Clinical pharmacist, he received a shipment of Tresiba and Novolog on Friday, but the normal front desk person was off. The insulin was left at room temperature all weekend. Can you call [facility] and see if they can send him another refill? I can send in a refill order if needed.    Per [name],  the provider office will need to document what happen and fax the letter over to [phone], and they will be happy to send another shipment. Per [name], with any situation where the shipment gets misplace or damage all that needs to be done is to type a letter up and send it to them and they will replace it.Notified Clinical pharmacist.  Anderson [place] Clinical Pharmacist Assistant [phone]

## 2021-08-23 ENCOUNTER — Ambulatory Visit
Admission: RE | Admit: 2021-08-23 | Discharge: 2021-08-23 | Disposition: A | Discharge status: Home / Self Care | Payer: Medicare HMO | Source: Ambulatory Visit | Attending: Acute Care | Admitting: Acute Care

## 2021-08-23 DIAGNOSIS — I129 Hypertensive chronic kidney disease with stage 1 through stage 4 chronic kidney disease, or unspecified chronic kidney disease: Secondary | ICD-10-CM | POA: Diagnosis not present

## 2021-08-23 DIAGNOSIS — F418 Other specified anxiety disorders: Secondary | ICD-10-CM

## 2021-08-23 DIAGNOSIS — Z940 Kidney transplant status: Secondary | ICD-10-CM

## 2021-08-23 DIAGNOSIS — F17210 Nicotine dependence, cigarettes, uncomplicated: Secondary | ICD-10-CM | POA: Insufficient documentation

## 2021-08-23 DIAGNOSIS — Z794 Long term (current) use of insulin: Secondary | ICD-10-CM | POA: Diagnosis not present

## 2021-08-23 DIAGNOSIS — E1122 Type 2 diabetes mellitus with diabetic chronic kidney disease: Secondary | ICD-10-CM

## 2021-08-23 DIAGNOSIS — Z7985 Long-term (current) use of injectable non-insulin antidiabetic drugs: Secondary | ICD-10-CM

## 2021-08-23 DIAGNOSIS — N1831 Chronic kidney disease, stage 3a: Secondary | ICD-10-CM

## 2021-08-23 DIAGNOSIS — E785 Hyperlipidemia, unspecified: Secondary | ICD-10-CM

## 2021-08-23 DIAGNOSIS — Z87891 Personal history of nicotine dependence: Secondary | ICD-10-CM | POA: Diagnosis not present

## 2021-08-23 DIAGNOSIS — F32A Depression, unspecified: Secondary | ICD-10-CM

## 2021-08-23 DIAGNOSIS — Z122 Encounter for screening for malignant neoplasm of respiratory organs: Secondary | ICD-10-CM | POA: Diagnosis not present

## 2021-08-24 ENCOUNTER — Other Ambulatory Visit: Payer: Self-pay | Admitting: Family Medicine

## 2021-08-24 DIAGNOSIS — L905 Scar conditions and fibrosis of skin: Secondary | ICD-10-CM

## 2021-08-24 DIAGNOSIS — M79604 Pain in right leg: Secondary | ICD-10-CM

## 2021-08-24 NOTE — Telephone Encounter (Signed)
Medication Refill - Medication: oxyCODONE (OXY IR/ROXICODONE) 5 MG immediate release tablet  Has the patient contacted their pharmacy? No. Pt previously told to contact provider  Preferred Pharmacy (with phone number or street name):  [facility], [place] Phone:  [phone]  Fax:  [phone]     Has the patient been seen for an appointment in the last year OR does the patient have an upcoming appointment? Yes.    Agent: Please be advised that RX refills may take up to 3 business days. We ask that you follow-up with your pharmacy.

## 2021-08-25 ENCOUNTER — Telehealth: Payer: Self-pay | Admitting: Family Medicine

## 2021-08-25 NOTE — Telephone Encounter (Signed)
Patient viewed cancer screening results on My Chart but in need of clarity, patient states PCP is the ordering physician. Patient would like a follow up cal .

## 2021-08-25 NOTE — Telephone Encounter (Signed)
Pt informed and verbalizes understanding.  Reports James Moreno set him up with 1-800-quit smoking, he is to receive med and nicotine gum and has a quit date. He wanted to be sure and make you aware.   Also, requesting refill for oxycodone.   LOV 07-31-21 NOV  LR 07-27-21 #240 with 0 refill

## 2021-08-25 NOTE — Telephone Encounter (Signed)
Requested medication (s) are due for refill today: yes  Requested medication (s) are on the active medication list: yes  Last refill:  07/27/21 #240  Future visit scheduled: yes  Notes to clinic:  med not delegated to NT to RF    Requested Prescriptions  Pending Prescriptions Disp Refills   oxyCODONE (OXY IR/ROXICODONE) 5 MG immediate release tablet 240 tablet 0    Sig: Take 1-2 tablets (5-10 mg total) by mouth every 6 (six) hours as needed for severe pain.     Not Delegated - Analgesics:  Opioid Agonists Failed - 08/24/2021 11:16 AM      Failed - This refill cannot be delegated      Failed - Urine Drug Screen completed in last 360 days      Passed - Valid encounter within last 3 months    Recent Outpatient Visits           2 months ago Primary hypertension   [facility] Birdie Sons, MD   10 months ago Type 2 diabetes mellitus with diabetic nephropathy, without long-term current use of insulin ([place])   [facility] Birdie Sons, MD   1 year ago Type 2 diabetes mellitus with diabetic nephropathy, without long-term current use of insulin ([place])   [facility] Birdie Sons, MD   1 year ago Type 2 diabetes mellitus with diabetic nephropathy, with long-term current use of insulin ([place])   [facility] Birdie Sons, MD   2 years ago Type 2 diabetes mellitus with diabetic nephropathy, without long-term current use of insulin [facility])   [facility] Birdie Sons, MD       Future Appointments             In 3 months Fisher, Kirstie Peri, MD [facility] [place]

## 2021-08-25 NOTE — Telephone Encounter (Signed)
Pt made another call to follow up on CT chest lung screen results, please advise.

## 2021-08-25 NOTE — Telephone Encounter (Signed)
CT does not show any cancerous or precancerous lung nodules. It does show emphysema and inflammation of the lungs, probably from smoking. He really needs to stop smoking.  It also shows atherosclerosis (hardening of the arteries) but we already knew he has that and he is already on medications for it.

## 2021-08-25 NOTE — Telephone Encounter (Signed)
Patient refers to the CT CHEST LUNG CA SCREEN results.

## 2021-08-25 NOTE — Telephone Encounter (Signed)
???   Don't know what he is talking about

## 2021-08-28 MED ORDER — OXYCODONE HCL 5 MG PO TABS: 5.0000 mg | ORAL_TABLET | Freq: Four times a day (QID) | ORAL | 0 refills | Status: DC | PRN

## 2021-08-30 ENCOUNTER — Telehealth: Payer: Self-pay | Admitting: Acute Care

## 2021-08-30 NOTE — Telephone Encounter (Signed)
I have attempted to call the patient with the results of their  Low Dose CT Chest Lung cancer screening scan. There was no answer. I have left a HIPPA compliant VM requesting the patient call the office for the scan results. I included the office contact information in the message. We will await his return call. If no return call we will continue to call until patient is contacted.   

## 2021-08-31 ENCOUNTER — Other Ambulatory Visit: Payer: Self-pay

## 2021-08-31 DIAGNOSIS — Z122 Encounter for screening for malignant neoplasm of respiratory organs: Secondary | ICD-10-CM

## 2021-08-31 DIAGNOSIS — Z87891 Personal history of nicotine dependence: Secondary | ICD-10-CM

## 2021-08-31 DIAGNOSIS — F17210 Nicotine dependence, cigarettes, uncomplicated: Secondary | ICD-10-CM

## 2021-08-31 NOTE — Telephone Encounter (Signed)
I have attempted to call the patient with the results of their  Low Dose CT Chest Lung cancer screening scan. There was no answer. I have left a HIPPA compliant VM requesting the patient call the office for the scan results. I included the office contact information in the message. We will await his return call. If no return call we will continue to call until patient is contacted.   

## 2021-08-31 NOTE — Telephone Encounter (Signed)
Review/discussion of results with Dr. Patsey Berthold and Eric Form, NP.  Agree Rads2 letter may be sent via mychart to patient.  Results faxed to PCP and letter sent to patient

## 2021-09-01 ENCOUNTER — Telehealth: Payer: Self-pay

## 2021-09-01 NOTE — Progress Notes (Cosign Needed)
    Chronic Care Management Pharmacy Assistant   Name: THORNTON DOHRMANN  MRN: [id number] DOB: March 12, 1966  Patient called to be reminded of his telephone appointment with [name], [name] on 09/04/2021 @ 1400   No answer, left message of appointment date, time and type of appointment (either telephone or in person). Left message to have all medications, supplements, blood pressure and/or blood sugar logs available during appointment and to return call if need to reschedule.   Star Rating Drug: Rosuvastatin 40 mg last filled on 07/20/2021 for a 90-Day supply via [place] 0.25 mg patient receives this medication via a patient assistance program with [name] Losartan 100 mg last filled on 06/05/2021 for a 90-Day supply via [place] 10 mg patient receives this medication via a patient assistance program with [name]   Any gaps in medications fill history? No   Care Gaps: HIV Screening Zoster Vaccine Tetanus/TDAP BP> 140/90 Hemoglobin A1C   Lynann Bologna, CPA/CMA Clinical Pharmacist Assistant Phone: [phone]

## 2021-09-04 ENCOUNTER — Telehealth: Payer: Medicare HMO

## 2021-09-04 ENCOUNTER — Telehealth: Payer: Self-pay | Admitting: Acute Care

## 2021-09-04 NOTE — Progress Notes (Deleted)
Chronic Care Management Pharmacy Note  09/04/2021 Name:  James Moreno MRN:  [id number] DOB:  08-21-1965  Summary: Patient presents for CCM follow-up. Patient in the preparation stage of quitting smoking. Patient set a quit date of May 31st, but plans to start using nicotine patch + gum in the next day.   Recommendations/Changes made from today's visit: -HC to assess smoking cessation in 1-2 weeks -START Nicotine Patch 21 mg/24 hr for 6 weeks + Nicotine lozenge 4 mg as needed for breakthrough cravings.  Plan: [name] follow-up 1 month  Subjective: [name] is an 56 y.o. year old male who is a primary patient of Fisher, Kirstie Peri, MD.  The CCM team was consulted for assistance with disease management and care coordination needs.    Engaged with patient by telephone for follow up visit in response to provider referral for pharmacy case management and/or care coordination services.   Consent to Services:  The patient was given information about Chronic Care Management services, agreed to services, and gave verbal consent prior to initiation of services.  Please see initial visit note for detailed documentation.   Patient Care Team: Birdie Sons, MD as PCP - General (Family Medicine) Rockey Situ Kathlene November, MD as PCP - Cardiology (Cardiology) Pa, [place] (Optometry) Anthonette Legato, MD (Nephrology) Long, Thomes Cake, MD as Referring Physician (Vascular Surgery) Isaias Sakai, MD as Referring Physician (Ophthalmology) Lin Landsman, MD as Consulting Physician (Gastroenterology) Wellington Hampshire, MD as Consulting Physician (Cardiology) Germaine Pomfret, [facility] (Pharmacist) Garrel Ridgel, DPM as Consulting Physician (Podiatry)  Recent office visits: 06/20/20: Patient presented to Dr. Caryn Section for follow-up. Chantix stopped, patient started on Nicotine Patch 21 mg daily.   Recent consult visits: 07/27/21: Patient presented to Dr. Honor Junes (endocrinology). BP  110/70. Ozempic 0.5 mg weekly.  07/10/21: Patient presented to Dr. Holley Raring (Nephrology).   Hospital visits: None in previous 6 months  Objective:  Lab Results  Component Value Date   CREATININE 1.72 (H) 06/20/2020   BUN 29 (H) 06/20/2020   GFRNONAA 39 (L) 09/17/2019   GFRAA 45 (L) 09/17/2019   NA 139 06/20/2020   K 4.2 06/20/2020   CALCIUM 10.7 (H) 06/20/2020   CO2 21 06/20/2020    Lab Results  Component Value Date/Time   HGBA1C 8.1 02/28/2021 12:00 AM   HGBA1C 8.8 05/25/2020 12:00 AM   MICROALBUR 100 12/31/2016 02:13 PM    Last diabetic Eye exam:  Lab Results  Component Value Date/Time   HMDIABEYEEXA Retinopathy (A) 06/14/2021 12:00 AM    Last diabetic Foot exam: No results found for: "HMDIABFOOTEX"   Lab Results  Component Value Date   CHOL 141 06/20/2020   HDL 46 06/20/2020   LDLCALC 53 06/20/2020   TRIG 268 (H) 06/20/2020   CHOLHDL 3.1 06/20/2020       Latest Ref Rng & Units 06/20/2020    9:03 AM 09/17/2019    2:17 PM 12/12/2018   12:00 AM  Hepatic Function  Total Protein 6.0 - 8.5 g/dL  6.5    Albumin 3.8 - 4.9 g/dL 4.2  4.3    AST 0 - 40 IU/L  9  14      ALT 0 - 44 IU/L  9  16      Alk Phosphatase 48 - 121 IU/L  131  95      Total Bilirubin 0.0 - 1.2 mg/dL  0.6       This result is from an external source.  Lab Results  Component Value Date/Time   TSH 1.34 12/12/2018 12:00 AM   TSH 1.72 06/13/2006 12:00 AM       Latest Ref Rng & Units 12/12/2018   12:00 AM 10/21/2015    1:10 PM 06/21/2013    2:29 PM  CBC  WBC  6.7     8.9  9.7   Hemoglobin 13.5 - 17.5 14.8     16.3  10.1   Hematocrit 41 - 53 43     46.7  28.9   Platelets 150 - 399 182     140  175      This result is from an external source.    No results found for: "VD25OH"  Clinical ASCVD: Yes  The 10-year ASCVD risk score (Arnett DK, et al., 2019) is: 13.6%   Values used to calculate the score:     Age: 56 years     Sex: Male     Is Non-Hispanic African American: No      Diabetic: Yes     Tobacco smoker: Yes     Systolic Blood Pressure: 097 mmHg     Is BP treated: Yes     HDL Cholesterol: 46 mg/dL     Total Cholesterol: 141 mg/dL       06/13/2021   10:27 AM 02/27/2021   10:59 AM 10/14/2020    3:41 PM  Depression screen PHQ 2/9  Decreased Interest 0 0 0  Down, Depressed, Hopeless 0 0 0  PHQ - 2 Score 0 0 0  Altered sleeping 0  0  Tired, decreased energy 0  0  Change in appetite 0  0  Feeling bad or failure about yourself  0  0  Trouble concentrating 0  0  Moving slowly or fidgety/restless 0  0  Suicidal thoughts 0  0  PHQ-9 Score 0  0  Difficult doing work/chores Not difficult at all  Not difficult at all      Social History   Tobacco Use  Smoking Status Every Day   Packs/day: 1.00   Years: 41.00   Total pack years: 41.00   Types: Cigarettes  Smokeless Tobacco Never  Tobacco Comments   since age 40.   BP Readings from Last 3 Encounters:  06/13/21 (!) 151/89  03/30/21 (!) 155/89  10/21/20 140/60   Pulse Readings from Last 3 Encounters:  06/13/21 90  03/30/21 97  10/21/20 89   Wt Readings from Last 3 Encounters:  08/23/21 238 lb (108 kg)  06/13/21 232 lb (105.2 kg)  03/30/21 230 lb 2 oz (104.4 kg)    Assessment/Interventions: Review of patient past medical history, allergies, medications, health status, including review of consultants reports, laboratory and other test data, was performed as part of comprehensive evaluation and provision of chronic care management services.   SDOH:  (Social Determinants of Health) assessments and interventions performed: No; completed in Mar 2023.   CCM Care Plan  Allergies  Allergen Reactions   No Known Allergies     Medications Reviewed Today     Reviewed by Magdalen Spatz, NP (Nurse Practitioner) on 08/22/21 at 1427  Med List Status: <None>   Medication Order Taking? Sig Documenting Provider Last Dose Status Informant  alprazolam (XANAX) 2 MG tablet [id number]  Take 1 tablet (2 mg  total) by mouth 3 (three) times daily as needed for sleep. Birdie Sons, MD  Active   amLODipine (NORVASC) 10 MG tablet [id number] No Take 10 mg by mouth  daily.  [provider] Taking Active   amLODipine (NORVASC) 10 MG tablet [id number] No Take 1 tablet (10 mg total) by mouth daily. Birdie Sons, MD Taking Active   B-D ULTRAFINE III SHORT PEN 31G X 8 MM MISC [id number] No  [provider] Taking Active   BD INSULIN SYRINGE U/F 31G X 5/16" 1 ML MISC [id number] No  [provider] Taking Active   Blood Glucose Monitoring Suppl (GLUCOCOM BLOOD GLUCOSE MONITOR) DEVI [id number] No Frequency:ONCE   Dosage:0.0     Instructions:  Note:Dose: N/A [provider] Taking Active Self           Med Note Kenton Kingfisher, Enrique Sack T   Wed Oct 30, 2017 11:50 AM)    calcitRIOL (ROCALTROL) 0.25 MCG capsule [id number] No Take 1 capsule (0.25 mcg total) by mouth daily. Birdie Sons, MD Taking Active   carvedilol (COREG) 12.5 MG tablet [id number] No Take 12.5 mg by mouth 2 (two) times daily. [provider] Taking Active   cinacalcet (SENSIPAR) 30 MG tablet [id number] No Take 1 tablet (30 mg total) by mouth daily. Birdie Sons, MD Taking Active   dapagliflozin propanediol ([name]) 10 MG TABS tablet [id number] No Take 1 tablet (10 mg total) by mouth daily before breakfast. Patient receives through [name] through Dec 2023 Birdie Sons, MD Taking Active   EDEX 40 MCG injection [id number] No 40 mcg by Intracavitary route as needed.  [provider] Taking Active   fenofibrate (TRICOR) 145 MG tablet [id number] No  [provider] Taking Active   insulin aspart (NOVOLOG) 100 UNIT/ML injection [id number] No Inject 15 Units into the skin 3 (three) times daily before meals. [provider] Taking Active Self           Med Note Raeford Razor Aug 15, 2020  3:50 PM) Frances Maywood through [name] Patient Assistance through Dec 2022   insulin  degludec [facility]) 100 UNIT/ML FlexTouch Pen [id number] No Inject 45 Units into the skin daily. [provider] Taking Active            Med Note Raeford Razor Aug 15, 2020  3:50 PM) Frances Maywood through [name] Patient Assistance through Dec 2022  Lancets (ONETOUCH DELICA PLUS [id number]) [place] [id number] No Use to monitor blood sugars four times daily as directed. DX E11.21, Z79.4 Birdie Sons, MD Taking Active   LANTUS SOLOSTAR 100 UNIT/ML Solostar Pen [id number] No  [provider] Taking Active   losartan (COZAAR) 100 MG tablet [id number] No Take 1 tablet (100 mg total) by mouth at bedtime. Birdie Sons, MD Taking Active   naloxone [facility]) nasal spray 4 mg/0.1 mL [id number] No Place 1 spray into the nose once.  [provider] Taking Active   nicotine (NICODERM CQ) 14 mg/24hr patch [id number] No Place 1 patch (14 mg total) onto the skin daily.  Patient not taking: Reported on 06/13/2021   Birdie Sons, MD Not Taking Active   NOVOLOG 100 UNIT/ML injection [id number] No  [provider] Taking Active   omeprazole (PRILOSEC) 20 MG capsule [id number]  Take by mouth. [provider]  Active   [facility] VERIO test strip [id number] No Use to monitor blood sugars four times daily as directed. DX E11.21, Z79.4 Birdie Sons, MD Taking Active   oxyCODONE (OXY IR/ROXICODONE) 5 MG immediate release tablet [id number]  Take 1-2 tablets (5-10 mg total) by mouth every 6 (six) hours as  needed for severe pain. Birdie Sons, MD  Active   rosuvastatin (CRESTOR) 40 MG tablet [id number]  TAKE 1 TABLET EVERY DAY Gollan, Kathlene November, MD  Active   Semaglutide,0.25 or 0.5MG/DOS, (OZEMPIC, 0.25 OR 0.5 MG/DOSE,) 2 MG/1.5ML SOPN [id number] No Inject 0.25 mg into the skin for four weeks, then inject 0.5 mg into the skin weekly. Birdie Sons, MD Taking Active   sildenafil (REVATIO) 20 MG tablet [id number] No  [provider] Taking Active    tacrolimus (PROGRAF) 1 MG capsule [id number] No Take 3 mg by mouth 2 (two) times daily.  [provider] Taking Active Self           Med Note Kenton Kingfisher, Germaine Pomfret Oct 30, 2017 11:51 AM)    tretinoin (RETIN-A) 0.05 % cream [id number] No Apply topically as needed.  [provider] Taking Active   triamcinolone cream (KENALOG) 0.1 % [id number] No APPLY DAILY TO INFLAMED BUMPS AS NEEDED [provider] Taking Active   XYOSTED 36 MG/0.5ML Darden Palmer [id number] No  [provider] Taking Active             Patient Active Problem List   Diagnosis Date Noted   Coronary artery disease of native artery of native heart with stable angina pectoris ([place]) 06/21/2021   Chronic kidney disease, stage 3b ([place]) 06/21/2021   Chronic pain following surgery or procedure 11/15/2020   Proliferative retinopathy of left eye due to diabetes mellitus ([place]) 01/28/2019   Esophageal dysphagia    Benign essential hypertension 12/11/2018   Secondary hyperparathyroidism of renal origin ([place]) 12/11/2018   Severe tobacco use disorder 08/01/2018   Atherosclerosis of artery of extremity with ulceration ([place]) 07/23/2018   Gastroesophageal reflux disease 05/26/2018   PAD (peripheral artery disease) ([place]) 10/30/2017   Chronic ulcer of heel, right, with unspecified severity ([place]) 10/16/2017   Hepatitis B core antibody positive 07/03/2017   Hypertriglyceridemia 07/03/2017   Chronic, continuous use of opioids 03/28/2016   Anxiety 03/28/2016   Pain in surgical scar 11/07/2015   Bulging eyes 01/24/2015   Leg mass 01/24/2015   Renal transplant, status post 01/24/2015   Carotid arterial disease ([place]) 12/27/2014   Compulsive tobacco user syndrome 12/27/2014   Abnormal EKG 04/06/2013   Erectile dysfunction 11/29/2012   Obesity 11/28/2012   Type 2 diabetes mellitus with diabetic nephropathy ([place]) 11/28/2012   Hypertension 12/21/2011   Hyperlipidemia 12/21/2011   Exposure to Mycobacterium  tuberculosis 10/30/2011   Obstructive apnea 01/09/2011   History of other malignant neoplasm of skin 07/16/2006   Glaucoma 01/13/2006   Episodic paroxysmal anxiety disorder 03/26/1998    Immunization History  Administered Date(s) Administered   Influenza Split 12/05/2010   Influenza,inj,Quad PF,6+ Mos 12/31/2016, 01/27/2019, 01/02/2021   Influenza-Unspecified 12/26/2020   PFIZER(Purple Top)SARS-COV-2 Vaccination 06/18/2019, 07/09/2019, 02/22/2020, 01/31/2021   Pneumococcal Polysaccharide-23 10/24/2010   Tdap 12/05/2010    Conditions to be addressed/monitored:  Hypertension, Hyperlipidemia, Diabetes, Coronary Artery Disease, GERD, Anxiety, Tobacco use and History of Renal Transplant   There are no care plans that you recently modified to display for this patient.    Medication Assistance:  Joni Reining, Cira Servant obtained through [name] medication assistance program.  Enrollment ends Dec 2023  Wilder Glade obtained through [name] medication assistance program.  Enrollment ends Dec 2023  Patient's preferred pharmacy is:  [place], [place] - [address] [place] [address] Phone: [phone] Fax: [phone]  [facility] (ESRD Bundle Only) - [place],  [place] [address] Phone: [phone] Fax: [place], [place] [address] [address] Phone: [phone] Fax: [phone]  [place], [place] [address] Phone: [phone] Fax: [phone]  Uses pill box? Yes Pt endorses 100% compliance  We discussed: Current pharmacy is preferred with insurance plan and patient is satisfied with pharmacy services Patient decided to: Continue current medication management strategy  Care Plan and Follow Up Patient Decision:   Patient agrees to Care Plan and Follow-up.  Compliance/Adherence/Medication fill history: Care Gaps: HIV Shingrix Tdap Covid Booster   Star-Rating Drugs: Rosuvastatin 40 mg last filled on 07/20/2021 for a 90-Day supply via [facility] Losartan 100 mg last filled on 06/05/2021 for a 90-Day supply via [place]: Telephone follow up appointment with care management team member scheduled for:  09/04/2021 at 2:00 PM  [name], PharmD, [place], [name]  Clinical Pharmacist Practitioner  [facility] [phone]  Current Barriers:  Unable to independently afford treatment regimen Unable to achieve control of Diabetes   Pharmacist Clinical Goal(s):  Over the next 90 days, patient will verbalize ability to afford treatment regimen achieve control of Diabetes as evidenced by A1c less than 7% through collaboration with PharmD and provider.   Interventions: 1:1 collaboration with Birdie Sons, MD regarding development and update of comprehensive plan of care as evidenced by provider attestation and co-signature Inter-disciplinary care team collaboration (see longitudinal plan of care) Comprehensive medication review performed; medication list updated in electronic medical record  Hypertension (BP goal <130/80) -Controlled -Current treatment: Amlodipine 10 mg daily Carvedilol 6.25 mg twice daily   Losartan 100 mg daily   -Medications previously tried: NA  -Current home readings:  -Denies hypotensive/hypertensive symptoms -Recommended to continue current medication  Hyperlipidemia: (LDL goal < 70) -History of PAD, CAD  -Controlled -Current treatment: Rosuvastatin 40 mg daily  -Medications previously tried: NA  -Educated on Importance of limiting foods high in cholesterol; -Recommended to continue current medication  Diabetes (A1c goal <7%) -Uncontrolled -Managed by Dr. Honor Junes, next follow-up scheduled for May 4th.  -Current  medications: [name] 10 mg daily: Appropriate, Query effective Novolog 15 units three times daily + 2 units for every 50 units above 150: Appropriate, Query effective  Ozempic 0.5 mg on Tuesdays: Appropriate, Query effective Tresiba 50 units daily (0.44 u/kg): Appropriate, Query effective -Medications previously tried: Lantus (Formulary)  -Current home glucose readings  Target 1/28-2/10 2/11-2/24 3/8-3/21 4/25-5/8 5/29-6/11  Number of days worn ? 14 days 14 14 14 14 14   % of time active ? 70% 50% 76% 83% 81% 81%  Mean Glucose (mg/dL)  182 207 175 167 183  GMI  7.7% 8.3% 7.5% 7.3% 7.7%  Glycemic Variability (%CV) ?36% 46.2% 48.4% 30.7% 45.1% 48.5%  Time above >250 mg/dL <5% 20% 35% 9% 15% 18%  Time above 70-180 mg/dL <25% 26% 15% 38% 18% 25%  Time in range: 70-180 mg/dL >70% 50% 47% 52% 66% 56%  Time below 70 mg/dL <4% 4% 3% 1% 1% 1%  Time below 54 mg/dL <1%   0%  0%  -Dietary Patterns: Intermittent fasting.  -Exericse Patterns: Plans to restart weekly  -Up to date with eye and foot exams.  -Counseled patient on managing side effects of Ozempic treatment, when to contact team in the setting of hypoglycemia. -Recommended to  continue current medication  Depression/Anxiety (Goal: Maintain stable mood and sleep) -Controlled -Current treatment: Alprazolam 2 mg three times daily as needed - Sleep  -Medications previously tried/failed: NA -PHQ9: 0 -GAD7: 0 -Educated on Benefits of medication for symptom control Benefits of cognitive-behavioral therapy with or without medication -Recommended to continue current medication   Renal Transplant  (Goal: prevent rejection of kidney ) -Managed by Dr. Holley Raring  -Controlled -Current treatment  Mycophenolate 500 mg 2 tablets twice daily  Tacrolimus 1 mg 3 capsules twice daily  -Medications previously tried: Myfortic (cost) -Recommended to continue current medication  Tobacco use (Goal Quit smoking) -Uncontrolled -Previous quit attempts:  Chantix (nightmares), nicotine gum (ulcers), nicotine patch (stickiness) -Current treatment  17-18 cigarettes daily  -Patient smokes Within 30 minutes of waking -Patient triggers include: stress and finishing a meal -Patient in the preparation stage of quitting smoking. Patient set a quit date of May 31st. Patient planned to to start using the patch tomorrow, discussed having the patient's true quit date be the same date he starts using the patch.    -Patient's partner also smokes, he states both are committed to trying to reduce/quit smoking.  -START Nicotine Patch 21 mg/24 hr for 6 weeks + Nicotine lozenge 4 mg as needed for breakthrough cravings.  -Counseled on patch placement, side effects, and option to remove at night if they experience trouble sleeping or bad dreams.   -Counseled on park & chew method for NRT gum. -HC to assess smoking cessation in 1-2 weeks   Chronic Kidney Disease Stage 3a  -All medications assessed for renal dosing and appropriateness in chronic kidney disease. -Recommended to continue current medication  Patient Goals/Self-Care Activities Over the next 90 days, patient will:  - check glucose 2-3 times daily , document, and provide at future appointments -check blood pressure 2-3 times weekly , document, and provide at future appointments -decrease cigarette use   Follow Up Plan: Telephone follow up appointment with care management team member scheduled for:  09/04/2021 at 2:00 PM

## 2021-09-04 NOTE — Telephone Encounter (Signed)
Returned call to pt and reviewed recent lung screening CT results with him. PT denies having any respiratory symptoms, cough , fever or congestion recently. He states that he has a occas cough related to smoking which he is working on quitting. I advised pt that his results have been forwarded to his PCP and that we will plan to repeat his CT in 1 year. Pt verbalized understanding and had no further questions.

## 2021-09-06 ENCOUNTER — Ambulatory Visit: Payer: Medicare HMO | Admitting: Podiatry

## 2021-09-15 ENCOUNTER — Telehealth: Payer: Self-pay

## 2021-09-15 NOTE — Progress Notes (Signed)
Chronic Care Management APPOINTMENT REMINDER   Called James Moreno, No answer, left message of appointment on 09/18/2021 at 2:00 pm via telephone visit with [name] , Pharm D. Notified to have all medications, supplements, blood pressure and/or blood sugar logs available during appointment and to return call if need to reschedule.    Everlean Cherry Clinical Pharmacist Assistant [phone]

## 2021-09-18 ENCOUNTER — Ambulatory Visit (INDEPENDENT_AMBULATORY_CARE_PROVIDER_SITE_OTHER): Payer: Medicare HMO

## 2021-09-18 DIAGNOSIS — F17200 Nicotine dependence, unspecified, uncomplicated: Secondary | ICD-10-CM

## 2021-09-18 DIAGNOSIS — F410 Panic disorder [episodic paroxysmal anxiety] without agoraphobia: Secondary | ICD-10-CM

## 2021-09-18 DIAGNOSIS — Z794 Long term (current) use of insulin: Secondary | ICD-10-CM

## 2021-09-22 ENCOUNTER — Telehealth: Payer: Self-pay

## 2021-09-22 ENCOUNTER — Other Ambulatory Visit: Payer: Self-pay | Admitting: Family Medicine

## 2021-09-22 DIAGNOSIS — F17210 Nicotine dependence, cigarettes, uncomplicated: Secondary | ICD-10-CM | POA: Diagnosis not present

## 2021-09-22 DIAGNOSIS — E1122 Type 2 diabetes mellitus with diabetic chronic kidney disease: Secondary | ICD-10-CM | POA: Diagnosis not present

## 2021-09-22 DIAGNOSIS — N1831 Chronic kidney disease, stage 3a: Secondary | ICD-10-CM

## 2021-09-22 DIAGNOSIS — I129 Hypertensive chronic kidney disease with stage 1 through stage 4 chronic kidney disease, or unspecified chronic kidney disease: Secondary | ICD-10-CM

## 2021-09-22 DIAGNOSIS — Z794 Long term (current) use of insulin: Secondary | ICD-10-CM

## 2021-09-22 DIAGNOSIS — M79604 Pain in right leg: Secondary | ICD-10-CM

## 2021-09-22 DIAGNOSIS — F32A Depression, unspecified: Secondary | ICD-10-CM | POA: Diagnosis not present

## 2021-09-22 DIAGNOSIS — L905 Scar conditions and fibrosis of skin: Secondary | ICD-10-CM

## 2021-09-22 DIAGNOSIS — E785 Hyperlipidemia, unspecified: Secondary | ICD-10-CM

## 2021-09-22 DIAGNOSIS — R52 Pain, unspecified: Secondary | ICD-10-CM

## 2021-09-22 MED ORDER — OXYCODONE HCL 5 MG PO TABS: 5.0000 mg | ORAL_TABLET | Freq: Four times a day (QID) | ORAL | 0 refills | Status: DC | PRN

## 2021-09-22 NOTE — Progress Notes (Signed)
Chronic Care Management Pharmacy Assistant   Name: James Moreno  MRN: [id number] DOB: 08-18-1965  Reason for Encounter: Medication Management  I spoke with the patient and per patient his mom just passed and he has not spoken to Endocrinology regarding his insulin. I spoke with [name] and per [name] he advise patient to do the following.  Per [name] until you can speak with your Endocrinologist he recommends decreasing your nighttime Novolog by 4 units. So for that shot  James Moreno recommends you give 11 units plus his sliding scale.  Patient verbalized understanding.    Medications: Outpatient Encounter Medications as of 09/22/2021  Medication Sig Note   alprazolam (XANAX) 2 MG tablet Take 1 tablet (2 mg total) by mouth 3 (three) times daily as needed for sleep.    amLODipine (NORVASC) 10 MG tablet Take 10 mg by mouth daily.     amLODipine (NORVASC) 10 MG tablet Take 1 tablet (10 mg total) by mouth daily.    B-D ULTRAFINE III SHORT PEN 31G X 8 MM MISC     BD INSULIN SYRINGE U/F 31G X 5/16" 1 ML MISC     Blood Glucose Monitoring Suppl (GLUCOCOM BLOOD GLUCOSE MONITOR) DEVI Frequency:ONCE   Dosage:0.0     Instructions:  Note:Dose: N/A    calcitRIOL (ROCALTROL) 0.25 MCG capsule Take 1 capsule (0.25 mcg total) by mouth daily.    carvedilol (COREG) 12.5 MG tablet Take 12.5 mg by mouth 2 (two) times daily.    cinacalcet (SENSIPAR) 30 MG tablet Take 1 tablet (30 mg total) by mouth daily.    dapagliflozin propanediol ([name]) 10 MG TABS tablet Take 1 tablet (10 mg total) by mouth daily before breakfast. Patient receives through [name] through Dec 2023    EDEX 40 MCG injection 40 mcg by Intracavitary route as needed.     fenofibrate (TRICOR) 145 MG tablet     insulin aspart (NOVOLOG) 100 UNIT/ML injection Inject 15 Units into the skin 3 (three) times daily before meals. 5/23/2022Frances Maywood through [name] Patient Assistance through Dec 2022    insulin degludec (TRESIBA FLEXTOUCH) 100 UNIT/ML  FlexTouch Pen Inject 45 Units into the skin daily. 5/23/2022Frances Maywood through [name] Patient Assistance through Dec 2022   Lancets [facility] DELICA PLUS [id number]) MISC Use to monitor blood sugars four times daily as directed. DX E11.21, Z79.4    LANTUS SOLOSTAR 100 UNIT/ML Solostar Pen     losartan (COZAAR) 100 MG tablet Take 1 tablet (100 mg total) by mouth at bedtime.    naloxone (NARCAN) nasal spray 4 mg/0.1 mL Place 1 spray into the nose once.     nicotine (NICODERM CQ) 14 mg/24hr patch Place 1 patch (14 mg total) onto the skin daily. (Patient not taking: Reported on 06/13/2021)    NOVOLOG 100 UNIT/ML injection     omeprazole (PRILOSEC) 20 MG capsule Take by mouth.    ONETOUCH VERIO test strip Use to monitor blood sugars four times daily as directed. DX E11.21, Z79.4    oxyCODONE (OXY IR/ROXICODONE) 5 MG immediate release tablet Take 1-2 tablets (5-10 mg total) by mouth every 6 (six) hours as needed for severe pain.    rosuvastatin (CRESTOR) 40 MG tablet TAKE 1 TABLET EVERY DAY    Semaglutide,0.25 or 0.'5MG'$ /DOS, (OZEMPIC, 0.25 OR 0.5 MG/DOSE,) 2 MG/1.5ML SOPN Inject 0.25 mg into the skin for four weeks, then inject 0.5 mg into the skin weekly.    sildenafil (REVATIO) 20 MG tablet     tacrolimus (PROGRAF)  1 MG capsule Take 3 mg by mouth 2 (two) times daily.     tretinoin (RETIN-A) 0.05 % cream Apply topically as needed.     triamcinolone cream (KENALOG) 0.1 % APPLY DAILY TO INFLAMED BUMPS AS NEEDED    XYOSTED 50 MG/0.5ML SOAJ     No facility-administered encounter medications on file as of 09/22/2021.   Lynann Bologna, CPA/CMA Clinical Pharmacist Assistant Phone: [phone]

## 2021-09-22 NOTE — Telephone Encounter (Signed)
Requested medication (s) are due for refill today: 5 days early  Requested medication (s) are on the active medication list: yes  Last refill:  08/28/21 #240 with 0 RF  Future visit scheduled: 12/15/21, seen 06/13/21  Notes to clinic:  This medication can not be delegated, please assess.        Requested Prescriptions  Pending Prescriptions Disp Refills   oxyCODONE (OXY IR/ROXICODONE) 5 MG immediate release tablet 240 tablet 0    Sig: Take 1-2 tablets (5-10 mg total) by mouth every 6 (six) hours as needed for severe pain.     Not Delegated - Analgesics:  Opioid Agonists Failed - 09/22/2021 11:33 AM      Failed - This refill cannot be delegated      Failed - Urine Drug Screen completed in last 360 days      Failed - Valid encounter within last 3 months    Recent Outpatient Visits           3 months ago Primary hypertension   [facility] Birdie Sons, MD   11 months ago Type 2 diabetes mellitus with diabetic nephropathy, without long-term current use of insulin ([place])   [facility] Birdie Sons, MD   1 year ago Type 2 diabetes mellitus with diabetic nephropathy, without long-term current use of insulin ([place])   [facility] Birdie Sons, MD   1 year ago Type 2 diabetes mellitus with diabetic nephropathy, with long-term current use of insulin [facility])   [facility] Birdie Sons, MD   2 years ago Type 2 diabetes mellitus with diabetic nephropathy, without long-term current use of insulin [facility])   [facility] Birdie Sons, MD       Future Appointments             In 2 months Fisher, Kirstie Peri, MD [facility], [place]

## 2021-09-22 NOTE — Telephone Encounter (Signed)
Medication Refill - Medication: Oxycodone 5 mg  Has the patient contacted their pharmacy? No. )  Preferred Pharmacy (with phone number or street name): [place] Has the patient been seen for an appointment in the last year OR does the patient have an upcoming appointment? Yes.    Agent: Please be advised that RX refills may take up to 3 business days. We ask that you follow-up with your pharmacy.

## 2021-09-27 ENCOUNTER — Ambulatory Visit: Payer: Medicare HMO | Admitting: Podiatry

## 2021-10-23 ENCOUNTER — Other Ambulatory Visit: Payer: Self-pay | Admitting: Family Medicine

## 2021-10-23 DIAGNOSIS — M79604 Pain in right leg: Secondary | ICD-10-CM

## 2021-10-23 DIAGNOSIS — L905 Scar conditions and fibrosis of skin: Secondary | ICD-10-CM

## 2021-10-23 NOTE — Telephone Encounter (Signed)
Medication Refill - Medication: oxyCODONE (OXY IR/ROXICODONE) 5 MG immediate release tablet  Has the patient contacted their pharmacy? No.  Preferred Pharmacy (with phone number or street name):  [place], [place] Phone:  [phone]  Fax:  [phone]     Has the patient been seen for an appointment in the last year OR does the patient have an upcoming appointment? Yes.

## 2021-10-24 ENCOUNTER — Ambulatory Visit (INDEPENDENT_AMBULATORY_CARE_PROVIDER_SITE_OTHER): Payer: Medicare HMO

## 2021-10-24 DIAGNOSIS — F419 Anxiety disorder, unspecified: Secondary | ICD-10-CM

## 2021-10-24 DIAGNOSIS — E1121 Type 2 diabetes mellitus with diabetic nephropathy: Secondary | ICD-10-CM

## 2021-10-24 NOTE — Telephone Encounter (Signed)
Requested medication (s) are due for refill today - provider review   Requested medication (s) are on the active medication list -yes  Future visit scheduled -yes  Last refill: 09/22/21 #240  Notes to clinic: non delegated Rx  Requested Prescriptions  Pending Prescriptions Disp Refills   oxyCODONE (OXY IR/ROXICODONE) 5 MG immediate release tablet 240 tablet 0    Sig: Take 1-2 tablets (5-10 mg total) by mouth every 6 (six) hours as needed for severe pain.     Not Delegated - Analgesics:  Opioid Agonists Failed - 10/23/2021  1:53 PM      Failed - This refill cannot be delegated      Failed - Urine Drug Screen completed in last 360 days      Failed - Valid encounter within last 3 months    Recent Outpatient Visits           4 months ago Primary hypertension   [facility] Birdie Sons, MD   1 year ago Type 2 diabetes mellitus with diabetic nephropathy, without long-term current use of insulin ([place])   [facility] Birdie Sons, MD   1 year ago Type 2 diabetes mellitus with diabetic nephropathy, without long-term current use of insulin ([place])   [facility] Birdie Sons, MD   1 year ago Type 2 diabetes mellitus with diabetic nephropathy, with long-term current use of insulin ([place])   [facility] Birdie Sons, MD   2 years ago Type 2 diabetes mellitus with diabetic nephropathy, without long-term current use of insulin [facility])   [facility] Birdie Sons, MD       Future Appointments             In 1 month Fisher, Kirstie Peri, MD [facility]               Requested Prescriptions  Pending Prescriptions Disp Refills   oxyCODONE (OXY IR/ROXICODONE) 5 MG immediate release tablet 240 tablet 0    Sig: Take 1-2 tablets (5-10 mg total) by mouth every 6 (six) hours as needed for severe pain.     Not Delegated - Analgesics:  Opioid Agonists Failed - 10/23/2021  1:53 PM       Failed - This refill cannot be delegated      Failed - Urine Drug Screen completed in last 360 days      Failed - Valid encounter within last 3 months    Recent Outpatient Visits           4 months ago Primary hypertension   [facility] Birdie Sons, MD   1 year ago Type 2 diabetes mellitus with diabetic nephropathy, without long-term current use of insulin ([place])   [facility] Birdie Sons, MD   1 year ago Type 2 diabetes mellitus with diabetic nephropathy, without long-term current use of insulin ([place])   [facility] Birdie Sons, MD   1 year ago Type 2 diabetes mellitus with diabetic nephropathy, with long-term current use of insulin [facility])   [facility] Birdie Sons, MD   2 years ago Type 2 diabetes mellitus with diabetic nephropathy, without long-term current use of insulin [facility])   [facility] Birdie Sons, MD       Future Appointments             In 1 month Fisher, Kirstie Peri, MD [facility], [place]

## 2021-10-24 NOTE — Progress Notes (Signed)
Chronic Care Management Pharmacy Note  10/30/2021 Name:  James Moreno MRN:  [id number] DOB:  1965-08-24  Summary: Patient presents for CCM follow-up.  Patient with worsening glucose control due to large variability in diet. Binge eating due to nerves. Poor sleep. He is interested in [place] today.   Recommendations/Changes made from today's visit: -At this time would not recommend increasing Ozempic or changing to [facility] due to patient history of challenging tolerability to Ozempic and overall low/variable appetite.   Plan: [name] follow-up 1 month  Subjective: James Moreno is an 56 y.o. year old male who is a primary patient of Fisher, Kirstie Peri, MD.  The CCM team was consulted for assistance with disease management and care coordination needs.    Engaged with patient by telephone for follow up visit in response to provider referral for pharmacy case management and/or care coordination services.   Consent to Services:  The patient was given information about Chronic Care Management services, agreed to services, and gave verbal consent prior to initiation of services.  Please see initial visit note for detailed documentation.   Patient Care Team: Birdie Sons, MD as PCP - General (Family Medicine) Rockey Situ Kathlene November, MD as PCP - Cardiology (Cardiology) Pa, Thomas (Optometry) Anthonette Legato, MD (Nephrology) Long, Thomes Cake, MD as Referring Physician (Vascular Surgery) Isaias Sakai, MD as Referring Physician (Ophthalmology) Lin Landsman, MD as Consulting Physician (Gastroenterology) Wellington Hampshire, MD as Consulting Physician (Cardiology) Germaine Pomfret, [facility] (Pharmacist) Garrel Ridgel, DPM as Consulting Physician (Podiatry)  Recent office visits: 06/13/21: Patient presented to Dr. Caryn Section for follow-up.    Recent consult visits: 07/27/21: Patient presented to Dr. Honor Junes (endocrinology). BP 110/70. Ozempic 0.5 mg weekly.  07/10/21: Patient  presented to Dr. Holley Raring (Nephrology).   Hospital visits: None in previous 6 months  Objective:  Lab Results  Component Value Date   CREATININE 1.72 (H) 06/20/2020   BUN 29 (H) 06/20/2020   GFRNONAA 39 (L) 09/17/2019   GFRAA 45 (L) 09/17/2019   NA 139 06/20/2020   K 4.2 06/20/2020   CALCIUM 10.7 (H) 06/20/2020   CO2 21 06/20/2020    Lab Results  Component Value Date/Time   HGBA1C 8.1 02/28/2021 12:00 AM   HGBA1C 8.8 05/25/2020 12:00 AM   MICROALBUR 100 12/31/2016 02:13 PM    Last diabetic Eye exam:  Lab Results  Component Value Date/Time   HMDIABEYEEXA Retinopathy (A) 06/14/2021 12:00 AM    Last diabetic Foot exam: No results found for: "HMDIABFOOTEX"   Lab Results  Component Value Date   CHOL 141 06/20/2020   HDL 46 06/20/2020   LDLCALC 53 06/20/2020   TRIG 268 (H) 06/20/2020   CHOLHDL 3.1 06/20/2020       Latest Ref Rng & Units 06/20/2020    9:03 AM 09/17/2019    2:17 PM 12/12/2018   12:00 AM  Hepatic Function  Total Protein 6.0 - 8.5 g/dL  6.5    Albumin 3.8 - 4.9 g/dL 4.2  4.3    AST 0 - 40 IU/L  9  14      ALT 0 - 44 IU/L  9  16      Alk Phosphatase 48 - 121 IU/L  131  95      Total Bilirubin 0.0 - 1.2 mg/dL  0.6       This result is from an external source.    Lab Results  Component Value Date/Time   TSH 1.34 12/12/2018  12:00 AM   TSH 1.72 06/13/2006 12:00 AM       Latest Ref Rng & Units 12/12/2018   12:00 AM 10/21/2015    1:10 PM 06/21/2013    2:29 PM  CBC  WBC  6.7     8.9  9.7   Hemoglobin 13.5 - 17.5 14.8     16.3  10.1   Hematocrit 41 - 53 43     46.7  28.9   Platelets 150 - 399 182     140  175      This result is from an external source.    No results found for: "VD25OH"  Clinical ASCVD: Yes  The 10-year ASCVD risk score (Arnett DK, et al., 2019) is: 15.7%   Values used to calculate the score:     Age: 56 years     Sex: Male     Is Non-Hispanic African American: No     Diabetic: Yes     Tobacco smoker: Yes     Systolic Blood  Pressure: 120 mmHg     Is BP treated: Yes     HDL Cholesterol: 46 mg/dL     Total Cholesterol: 141 mg/dL       06/13/2021   10:27 AM 02/27/2021   10:59 AM 10/14/2020    3:41 PM  Depression screen PHQ 2/9  Decreased Interest 0 0 0  Down, Depressed, Hopeless 0 0 0  PHQ - 2 Score 0 0 0  Altered sleeping 0  0  Tired, decreased energy 0  0  Change in appetite 0  0  Feeling bad or failure about yourself  0  0  Trouble concentrating 0  0  Moving slowly or fidgety/restless 0  0  Suicidal thoughts 0  0  PHQ-9 Score 0  0  Difficult doing work/chores Not difficult at all  Not difficult at all      Social History   Tobacco Use  Smoking Status Every Day   Packs/day: 1.00   Years: 41.00   Total pack years: 41.00   Types: Cigarettes  Smokeless Tobacco Never  Tobacco Comments   since age 56.   BP Readings from Last 3 Encounters:  06/13/21 (!) 151/89  03/30/21 (!) 155/89  10/21/20 140/60   Pulse Readings from Last 3 Encounters:  06/13/21 90  03/30/21 97  10/21/20 89   Wt Readings from Last 3 Encounters:  08/23/21 238 lb (108 kg)  06/13/21 232 lb (105.2 kg)  03/30/21 230 lb 2 oz (104.4 kg)    Assessment/Interventions: Review of patient past medical history, allergies, medications, health status, including review of consultants reports, laboratory and other test data, was performed as part of comprehensive evaluation and provision of chronic care management services.   SDOH:  (Social Determinants of Health) assessments and interventions performed: No; completed in Mar 2023.   CCM Care Plan  Allergies  Allergen Reactions   No Known Allergies     Medications Reviewed Today     Reviewed by Magdalen Spatz, NP (Nurse Practitioner) on 08/22/21 at 1427  Med List Status: <None>   Medication Order Taking? Sig Documenting Provider Last Dose Status Informant  alprazolam (XANAX) 2 MG tablet [id number]  Take 1 tablet (2 mg total) by mouth 3 (three) times daily as needed for sleep.  Birdie Sons, MD  Active   amLODipine (NORVASC) 10 MG tablet [id number] No Take 10 mg by mouth daily.  [provider] Taking Active   amLODipine (NORVASC)  10 MG tablet [id number] No Take 1 tablet (10 mg total) by mouth daily. Birdie Sons, MD Taking Active   B-D ULTRAFINE III SHORT PEN 31G X 8 MM MISC [id number] No  [provider] Taking Active   BD INSULIN SYRINGE U/F 31G X 5/16" 1 ML MISC [id number] No  [provider] Taking Active   Blood Glucose Monitoring Suppl (GLUCOCOM BLOOD GLUCOSE MONITOR) DEVI [id number] No Frequency:ONCE   Dosage:0.0     Instructions:  Note:Dose: N/A [provider] Taking Active Self           Med Note [place] Kingfisher, Enrique Sack T   Wed Oct 30, 2017 11:50 AM)    calcitRIOL (ROCALTROL) 0.25 MCG capsule [id number] No Take 1 capsule (0.25 mcg total) by mouth daily. Birdie Sons, MD Taking Active   carvedilol (COREG) 12.5 MG tablet [id number] No Take 12.5 mg by mouth 2 (two) times daily. [provider] Taking Active   cinacalcet (SENSIPAR) 30 MG tablet [id number] No Take 1 tablet (30 mg total) by mouth daily. Birdie Sons, MD Taking Active   dapagliflozin propanediol ([name]) 10 MG TABS tablet [id number] No Take 1 tablet (10 mg total) by mouth daily before breakfast. Patient receives through [name] through Dec 2023 Birdie Sons, MD Taking Active   EDEX 40 MCG injection [id number] No 40 mcg by Intracavitary route as needed.  [provider] Taking Active   fenofibrate (TRICOR) 145 MG tablet [id number] No  [provider] Taking Active   insulin aspart (NOVOLOG) 100 UNIT/ML injection [id number] No Inject 15 Units into the skin 3 (three) times daily before meals. [provider] Taking Active Self           Med Note Raeford Razor Aug 15, 2020  3:50 PM) Frances [place] through [name] Patient Assistance through Dec 2022   insulin degludec [facility]) 100 UNIT/ML FlexTouch Pen [id number]  No Inject 45 Units into the skin daily. [provider] Taking Active            Med Note Raeford Razor Aug 15, 2020  3:50 PM) Frances [place] through [name] Patient Assistance through Dec 2022  Lancets (ONETOUCH DELICA PLUS KTCCEQ33V) [place] [id number] No Use to monitor blood sugars four times daily as directed. DX E11.21, Z79.4 Birdie Sons, MD Taking Active   LANTUS SOLOSTAR 100 UNIT/ML Solostar Pen [id number] No  [provider] Taking Active   losartan (COZAAR) 100 MG tablet [id number] No Take 1 tablet (100 mg total) by mouth at bedtime. Birdie Sons, MD Taking Active   naloxone [facility]) nasal spray 4 mg/0.1 mL [id number] No Place 1 spray into the nose once.  [provider] Taking Active   nicotine (NICODERM CQ) 14 mg/24hr patch [id number] No Place 1 patch (14 mg total) onto the skin daily.  Patient not taking: Reported on 06/13/2021   Birdie Sons, MD Not Taking Active   NOVOLOG 100 UNIT/ML injection [id number] No  [provider] Taking Active   omeprazole (PRILOSEC) 20 MG capsule [id number]  Take by mouth. [provider]  Active   [facility] VERIO test strip [id number] No Use to monitor blood sugars four times daily as directed. DX E11.21, Z79.4 Birdie Sons, MD Taking Active   oxyCODONE (OXY IR/ROXICODONE) 5 MG immediate release tablet [id number]  Take 1-2 tablets (5-10 mg total) by mouth every 6 (six) hours as needed for severe pain. Birdie Sons, MD  Active  rosuvastatin (CRESTOR) 40 MG tablet [id number]  TAKE 1 TABLET EVERY DAY Gollan, Kathlene November, MD  Active   Semaglutide,0.25 or 0.5MG/DOS, (OZEMPIC, 0.25 OR 0.5 MG/DOSE,) 2 MG/1.5ML SOPN [id number] No Inject 0.25 mg into the skin for four weeks, then inject 0.5 mg into the skin weekly. Birdie Sons, MD Taking Active   sildenafil (REVATIO) 20 MG tablet [id number] No  [provider] Taking Active   tacrolimus (PROGRAF) 1 MG capsule [id number] No Take 3 mg by mouth 2  (two) times daily.  [provider] Taking Active Self           Med Note [place]on Kingfisher, Germaine Pomfret Oct 30, 2017 11:51 AM)    tretinoin (RETIN-A) 0.05 % cream [id number] No Apply topically as needed.  [provider] Taking Active   triamcinolone cream (KENALOG) 0.1 % [id number] No APPLY DAILY TO INFLAMED BUMPS AS NEEDED [provider] Taking Active   XYOSTED 36 MG/0.5ML Darden Palmer [id number] No  [provider] Taking Active             Patient Active Problem List   Diagnosis Date Noted   Coronary artery disease of native artery of native heart with stable angina pectoris ([place]) 06/21/2021   Chronic kidney disease, stage 3b ([place]) 06/21/2021   Chronic pain following surgery or procedure 11/15/2020   Proliferative retinopathy of left eye due to diabetes mellitus ([place]) 01/28/2019   Esophageal dysphagia    Benign essential hypertension 12/11/2018   Secondary hyperparathyroidism of renal origin ([place]) 12/11/2018   Severe tobacco use disorder 08/01/2018   Atherosclerosis of artery of extremity with ulceration ([place]) 07/23/2018   Gastroesophageal reflux disease 05/26/2018   PAD (peripheral artery disease) ([place]) 10/30/2017   Chronic ulcer of heel, right, with unspecified severity ([place]) 10/16/2017   Hepatitis B core antibody positive 07/03/2017   Hypertriglyceridemia 07/03/2017   Chronic, continuous use of opioids 03/28/2016   Anxiety 03/28/2016   Pain in surgical scar 11/07/2015   Bulging eyes 01/24/2015   Leg mass 01/24/2015   Renal transplant, status post 01/24/2015   Carotid arterial disease ([place]) 12/27/2014   Compulsive tobacco user syndrome 12/27/2014   Abnormal EKG 04/06/2013   Erectile dysfunction 11/29/2012   Obesity 11/28/2012   Type 2 diabetes mellitus with diabetic nephropathy ([place]) 11/28/2012   Hypertension 12/21/2011   Hyperlipidemia 12/21/2011   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 10/30/2011   Obstructive apnea 01/09/2011   History of  other malignant neoplasm of skin 07/16/2006   Glaucoma 01/13/2006   Episodic paroxysmal anxiety disorder 03/26/1998    Immunization History  Administered Date(s) Administered   Influenza Split 12/05/2010   Influenza,inj,Quad PF,6+ Mos 12/31/2016, 01/27/2019, 01/02/2021   Influenza-Unspecified 12/26/2020   PFIZER(Purple Top)SARS-COV-2 Vaccination 06/18/2019, 07/09/2019, 02/22/2020, 01/31/2021   Pneumococcal Polysaccharide-23 10/24/2010   Tdap 12/05/2010    Conditions to be addressed/monitored:  Hypertension, Hyperlipidemia, Diabetes, Coronary Artery Disease, GERD, Anxiety, Tobacco use and History of Renal Transplant   Care Plan : General Pharmacy (Adult)  Updates made by Germaine Pomfret, RPH since 10/30/2021 12:00 AM     Problem: Hypertension, Hyperlipidemia, Diabetes, Coronary Artery Disease, GERD, Anxiety, Tobacco use and History of Renal Transplant   Priority: High     Long-Range Goal: Patient-Specific Goal   Start Date: 05/19/2020  Expected End Date: 12/12/2021  This Visit's Progress: On track  Recent Progress: On track  Priority: High  Note:   Current Barriers:  Unable to independently afford treatment regimen Unable to achieve control of Diabetes  Pharmacist Clinical Goal(s):  Over the next 90 days, patient will verbalize ability to afford treatment regimen achieve control of Diabetes as evidenced by A1c less than 7% through collaboration with PharmD and provider.   Interventions: 1:1 collaboration with Birdie Sons, MD regarding development and update of comprehensive plan of care as evidenced by provider attestation and co-signature Inter-disciplinary care team collaboration (see longitudinal plan of care) Comprehensive medication review performed; medication list updated in electronic medical record  Hypertension (BP goal <130/80) -Controlled -Current treatment: Amlodipine 10 mg daily Carvedilol 6.25 mg twice daily   Losartan 100 mg daily    -Medications previously tried: NA  -Current home readings:  -Denies hypotensive/hypertensive symptoms -Recommended to continue current medication  Hyperlipidemia: (LDL goal < 70) -History of PAD, CAD  -Controlled -Current treatment: Rosuvastatin 40 mg daily  -Medications previously tried: NA  -Educated on Importance of limiting foods high in cholesterol; -Recommended to continue current medication  Diabetes (A1c goal <7%) -Uncontrolled -Managed by Dr. Honor Junes -Current medications: [name] 10 mg daily: Appropriate, Query effective Novolog 15 units three times daily + 2 units for every 50 units above 150: Appropriate, Query effective  Ozempic 0.5 mg on Tuesdays: Appropriate, Query effective Tresiba 50 units daily (0.44 u/kg): Appropriate, Query effective -Medications previously tried: Lantus (Formulary)  -Current home glucose readings  Target 3/8-3/21 4/25-5/8 6/13-6/26 7/19-8/1  Number of days worn ? 14 days _0 % of time active ? 70% 83% 81% 86% 75%  Mean Glucose (mg/dL)  175 167 167 204  GMI  7.5% 7.3% 7.3% 8.2%  Glycemic Variability (%CV) ?36% 30.7% 45.1% 42.5% 49.5%  Time above >250 mg/dL <5% 9% 15% 12% 29%  Time above 70-180 mg/dL <25% 38% 18% 25% 16%  Time in range: 70-180 mg/dL >70% 52% 66% 62% 54%  Time below 70 mg/dL <4% 1% 1% 1% 1%  Time below 54 mg/dL <1% 0%     -Dietary Patterns: Patient with large variability in diet. Binge eating due to nerves. Poor sleep. Cut out most diet soda, trying to hydrate more with water.  -Exericse Patterns: Plans to restart weekly  -At this time would not recommend increasing Ozempic or changing to [facility] due to patient history of challenging tolerability to Ozempic and overall low/variable appetite.  -Continue current medications  Anxiety (Goal: Maintain stable mood and sleep) -Controlled -Current treatment: Alprazolam 2 mg three times daily as needed - Sleep  -Medications previously tried/failed: NA -PHQ9:  0 -GAD7: 5 -Still struggling with grief from his mother's death. Some days he feels ok but other times   Renal Transplant  (Goal: prevent rejection of kidney ) -Managed by Dr. Holley Raring  -Controlled -Current treatment  Mycophenolate 500 mg 2 tablets twice daily  Tacrolimus 1 mg 3 capsules twice daily  -Medications previously tried: Myfortic (cost) -Recommended to continue current medication  Tobacco use (Goal Quit smoking) -Uncontrolled -Previous quit attempts: Chantix (nightmares), nicotine gum (ulcers), nicotine patch (stickiness) -Current treatment  17-18 cigarettes daily  -Patient smokes Within 30 minutes of waking -Patient triggers include: stress and finishing a meal -Patient was in the preparation stage of quitting smoking with an expected quit date of May 31st. Unfortunately, patient's mother was hospitalized and patient decided to defer his plan.  Chronic Kidney Disease Stage 3a  -All medications assessed for renal dosing and appropriateness in chronic kidney disease. -Recommended to continue current medication  Patient Goals/Self-Care Activities Over the next 90 days, patient will:  - check glucose 2-3 times daily ,  document, and provide at future appointments -check blood pressure 2-3 times weekly , document, and provide at future appointments -decrease cigarette use   Follow Up Plan: Telephone follow up appointment with care management team member scheduled for:  12/04/2021 at 2:00 PM    Medication Assistance:  Joni Reining, Novolog obtained through [name] medication assistance program.  Enrollment ends Dec 2023  Wilder Glade obtained through [name] medication assistance program.  Enrollment ends Dec 2023  Patient's preferred pharmacy is:  [facility] [address] [place] - [place] [address] Phone: [phone] Fax: [phone]  [facility] (ESRD Bundle Only) - [place], [address] [place] [address] Phone: [phone] Fax: [place], [address] [place] [address] Phone: [phone] Fax: [phone]  [place], [place] [address] Phone: [phone] Fax: [phone]  Uses pill box? Yes Pt endorses 100% compliance  We discussed: Current pharmacy is preferred with insurance plan and patient is satisfied with pharmacy services Patient decided to: Continue current medication management strategy  Care Plan and Follow Up Patient Decision:  Patient agrees to Care Plan and Follow-up.  Compliance/Adherence/Medication fill history: Care Gaps: HIV Shingrix Tdap Covid Booster   Star-Rating Drugs: Rosuvastatin 40 mg last filled on 07/20/2021 for a 90-Day supply via [place] Losartan 100 mg last filled on 06/05/2021 for a 90-Day supply via [place]: Telephone follow up appointment with care management team member scheduled for:  12/04/2021 at 2:00 PM  [name], PharmD, [place], [place] Pharmacist Practitioner  [facility] [phone]

## 2021-10-25 DIAGNOSIS — N186 End stage renal disease: Secondary | ICD-10-CM | POA: Diagnosis not present

## 2021-10-25 DIAGNOSIS — Z940 Kidney transplant status: Secondary | ICD-10-CM | POA: Diagnosis not present

## 2021-10-25 DIAGNOSIS — N2581 Secondary hyperparathyroidism of renal origin: Secondary | ICD-10-CM | POA: Diagnosis not present

## 2021-10-25 DIAGNOSIS — I10 Essential (primary) hypertension: Secondary | ICD-10-CM | POA: Diagnosis not present

## 2021-10-25 MED ORDER — OXYCODONE HCL 5 MG PO TABS: 5.0000 mg | ORAL_TABLET | Freq: Four times a day (QID) | ORAL | 0 refills | Status: DC | PRN

## 2021-10-26 ENCOUNTER — Other Ambulatory Visit: Payer: Self-pay

## 2021-10-26 NOTE — Patient Outreach (Signed)
[facility]) Care Management  10/26/2021  James Moreno 1965-11-22 [id number]   Telephone Screen   Voicemail message received from patient. Return call placed to patient. Patient confirms that he is active with CCM pharmacist at PCP office. He reports she has been working with him for about  year and how much he has helped with his medical care and needs. Pharmacist has gotten patient on right meds and helped lower A1C level (7.1). Praised patient for progress. Advised patient that [facility] would not follow since he is already engaged with CCM pharmacist. He voiced understanding and appreciation of call.      Plan: RN CM will close case.   Enzo Montgomery, RN,BSN,CCM [place] Management Telephonic Care Management Coordinator Direct Phone: [phone] Toll Free: [phone] Fax: [phone]

## 2021-10-26 NOTE — Patient Outreach (Signed)
[facility]) Care Management  10/26/2021  CLAUD GOWAN March 12, 1966 [id number]   Telephone Screen    Outreach call to patient to introduce [facility] services and assess care needs as part of benefit of PCP office and insurance plan. No answer. RN CM left HIPAA compliant voicemail message along with contact info.    Plan: RN CM will make outreach attempt to patient within 4 business days. RN CM will send unsuccessful outreach letter to patient.   Enzo Montgomery, RN,BSN,CCM [place] Management Telephonic Care Management Coordinator Direct Phone: [phone] Toll Free: [phone] Fax: [phone]

## 2021-10-30 NOTE — Patient Instructions (Signed)
Visit Information It was great speaking with you today!  Please let me know if you have any questions about our visit.   Goals Addressed             This Visit's Progress    Monitor and Manage My Blood Sugar-Diabetes Type 2   On track    Timeframe:  Long-Range Goal Priority:  High Start Date: 05/17/2020                            Expected End Date: 11/16/2021                       Follow Up within 30 days    - check blood sugar at prescribed times - check blood sugar if I feel it is too high or too low - enter blood sugar readings and medication or insulin into daily log    Why is this important?   Checking your blood sugar at home helps to keep it from getting very high or very low.  Writing the results in a diary or log helps the doctor know how to care for you.  Your blood sugar log should have the time, date and the results.  Also, write down the amount of insulin or other medicine that you take.  Other information, like what you ate, exercise done and how you were feeling, will also be helpful.     Notes:         Patient Care Plan: General Pharmacy (Adult)     Problem Identified: Hypertension, Hyperlipidemia, Diabetes, Coronary Artery Disease, GERD, Anxiety, Tobacco use and History of Renal Transplant   Priority: High     Long-Range Goal: Patient-Specific Goal   Start Date: 05/19/2020  Expected End Date: 12/12/2021  This Visit's Progress: On track  Recent Progress: On track  Priority: High  Note:   Current Barriers:  Unable to independently afford treatment regimen Unable to achieve control of Diabetes   Pharmacist Clinical Goal(s):  Over the next 90 days, patient will verbalize ability to afford treatment regimen achieve control of Diabetes as evidenced by A1c less than 7% through collaboration with PharmD and provider.   Interventions: 1:1 collaboration with Birdie Sons, MD regarding development and update of comprehensive plan of care as evidenced by  provider attestation and co-signature Inter-disciplinary care team collaboration (see longitudinal plan of care) Comprehensive medication review performed; medication list updated in electronic medical record  Hypertension (BP goal <130/80) -Controlled -Current treatment: Amlodipine 10 mg daily Carvedilol 6.25 mg twice daily   Losartan 100 mg daily   -Medications previously tried: NA  -Current home readings:  -Denies hypotensive/hypertensive symptoms -Recommended to continue current medication  Hyperlipidemia: (LDL goal < 70) -History of PAD, CAD  -Controlled -Current treatment: Rosuvastatin 40 mg daily  -Medications previously tried: NA  -Educated on Importance of limiting foods high in cholesterol; -Recommended to continue current medication  Diabetes (A1c goal <7%) -Uncontrolled -Managed by Dr. Honor Junes -Current medications: [name] 10 mg daily: Appropriate, Query effective Novolog 15 units three times daily + 2 units for every 50 units above 150: Appropriate, Query effective  Ozempic 0.5 mg on Tuesdays: Appropriate, Query effective Tresiba 50 units daily (0.44 u/kg): Appropriate, Query effective -Medications previously tried: Lantus (Formulary)  -Current home glucose readings  Target 3/8-3/21 4/25-5/8 6/13-6/26 7/19-8/1  Number of days worn ? 14 days '14 14 14 14  '$ % of time active ? 70%  83% 81% 86% 75%  Mean Glucose (mg/dL)  175 167 167 204  GMI  7.5% 7.3% 7.3% 8.2%  Glycemic Variability (%CV) ?36% 30.7% 45.1% 42.5% 49.5%  Time above >250 mg/dL <5% 9% 15% 12% 29%  Time above 70-180 mg/dL <25% 38% 18% 25% 16%  Time in range: 70-180 mg/dL >70% 52% 66% 62% 54%  Time below 70 mg/dL <4% 1% 1% 1% 1%  Time below 54 mg/dL <1% 0%     -Dietary Patterns: Patient with large variability in diet. Binge eating due to nerves. Poor sleep. Cut out most diet soda, trying to hydrate more with water.  -Exericse Patterns: Plans to restart weekly  -At this time would not recommend  increasing Ozempic or changing to [facility] due to patient history of challenging tolerability to Ozempic and overall low/variable appetite.  -Continue current medications  Anxiety (Goal: Maintain stable mood and sleep) -Controlled -Current treatment: Alprazolam 2 mg three times daily as needed - Sleep  -Medications previously tried/failed: NA -PHQ9: 0 -GAD7: 5 -Still struggling with grief from his mother's death. Some days he feels ok but other times   Renal Transplant  (Goal: prevent rejection of kidney ) -Managed by Dr. Holley Raring  -Controlled -Current treatment  Mycophenolate 500 mg 2 tablets twice daily  Tacrolimus 1 mg 3 capsules twice daily  -Medications previously tried: Myfortic (cost) -Recommended to continue current medication  Tobacco use (Goal Quit smoking) -Uncontrolled -Previous quit attempts: Chantix (nightmares), nicotine gum (ulcers), nicotine patch (stickiness) -Current treatment  17-18 cigarettes daily  -Patient smokes Within 30 minutes of waking -Patient triggers include: stress and finishing a meal -Patient was in the preparation stage of quitting smoking with an expected quit date of May 31st. Unfortunately, patient's mother was hospitalized and patient decided to defer his plan.  Chronic Kidney Disease Stage 3a  -All medications assessed for renal dosing and appropriateness in chronic kidney disease. -Recommended to continue current medication  Patient Goals/Self-Care Activities Over the next 90 days, patient will:  - check glucose 2-3 times daily , document, and provide at future appointments -check blood pressure 2-3 times weekly , document, and provide at future appointments -decrease cigarette use   Follow Up Plan: Telephone follow up appointment with care management team member scheduled for:  12/04/2021 at 2:00 PM      Patient agreed to services and verbal consent obtained.   Patient verbalizes understanding of instructions and care plan provided  today and agrees to view in [place]. Active MyChart status and patient understanding of how to access instructions and care plan via MyChart confirmed with patient.     [name], PharmD, [place], [name]  Clinical Pharmacist Practitioner  [facility] [phone]

## 2021-11-06 ENCOUNTER — Ambulatory Visit (INDEPENDENT_AMBULATORY_CARE_PROVIDER_SITE_OTHER): Payer: Medicare HMO | Admitting: Podiatry

## 2021-11-06 ENCOUNTER — Encounter: Payer: Self-pay | Admitting: Podiatry

## 2021-11-06 DIAGNOSIS — B351 Tinea unguium: Secondary | ICD-10-CM

## 2021-11-06 DIAGNOSIS — E0859 Diabetes mellitus due to underlying condition with other circulatory complications: Secondary | ICD-10-CM

## 2021-11-06 DIAGNOSIS — Z794 Long term (current) use of insulin: Secondary | ICD-10-CM | POA: Diagnosis not present

## 2021-11-06 DIAGNOSIS — M79676 Pain in unspecified toe(s): Secondary | ICD-10-CM

## 2021-11-06 NOTE — Progress Notes (Signed)
He presents today chief complaint of painfully elongated toenails 1 through 5 bilaterally.  Objective: Vital signs stable alert oriented x3 toenails are long thick yellow dystrophic Lee mycotic.  Assessment: Pain in limb secondary onychomycosis.  Plan: Debridement of toenails 1 through 5 bilaterally.  And at the end of the third toe trying to remove a little skin tag and cleaned it immediately with alcohol and placed silver nitrate on it.  Appears to be okay.  I will follow-up with him in 3 weeks he will watch for signs and symptoms of infection.

## 2021-11-13 DIAGNOSIS — E1121 Type 2 diabetes mellitus with diabetic nephropathy: Secondary | ICD-10-CM | POA: Diagnosis not present

## 2021-11-20 ENCOUNTER — Other Ambulatory Visit: Payer: Self-pay | Admitting: Family Medicine

## 2021-11-20 DIAGNOSIS — R52 Pain, unspecified: Secondary | ICD-10-CM

## 2021-11-20 DIAGNOSIS — L905 Scar conditions and fibrosis of skin: Secondary | ICD-10-CM

## 2021-11-20 DIAGNOSIS — M79604 Pain in right leg: Secondary | ICD-10-CM

## 2021-11-20 NOTE — Telephone Encounter (Signed)
Medication Refill - Medication: oxyCODONE (OXY IR/ROXICODONE) 5 MG immediate release tablet   Has the patient contacted their pharmacy? No.  Preferred Pharmacy (with phone number or street name):  [id number] - [place], [place] - [address] Phone: [phone]  Fax: [phone]     Has the patient been seen for an appointment in the last year OR does the patient have an upcoming appointment? Yes.    Agent: Please be advised that RX refills may take up to 3 business days. We ask that you follow-up with your pharmacy.  

## 2021-11-21 NOTE — Telephone Encounter (Signed)
Requested medication (s) are due for refill today - yes  Requested medication (s) are on the active medication list -yes  Future visit scheduled -yes  Last refill: 10/25/21 #240  Notes to clinic: non delegated Rx  Requested Prescriptions  Pending Prescriptions Disp Refills   oxyCODONE (OXY IR/ROXICODONE) 5 MG immediate release tablet 240 tablet 0    Sig: Take 1-2 tablets (5-10 mg total) by mouth every 6 (six) hours as needed for severe pain.     Not Delegated - Analgesics:  Opioid Agonists Failed - 11/20/2021  2:08 PM      Failed - This refill cannot be delegated      Failed - Urine Drug Screen completed in last 360 days      Failed - Valid encounter within last 3 months    Recent Outpatient Visits           5 months ago Primary hypertension   [facility] Birdie Sons, MD   1 year ago Type 2 diabetes mellitus with diabetic nephropathy, without long-term current use of insulin ([place])   [facility] Birdie Sons, MD   1 year ago Type 2 diabetes mellitus with diabetic nephropathy, without long-term current use of insulin ([place])   [facility] Birdie Sons, MD   1 year ago Type 2 diabetes mellitus with diabetic nephropathy, with long-term current use of insulin ([place])   [facility] Birdie Sons, MD   2 years ago Type 2 diabetes mellitus with diabetic nephropathy, without long-term current use of insulin [facility])   [place], Kirstie Peri, MD       Future Appointments             In 3 weeks Fisher, Kirstie Peri, MD [facility]   In 2 months Gollan, Kathlene November, MD [place]   In 3 months Vanga, Tally Due, MD [facility]               Requested Prescriptions  Pending Prescriptions Disp Refills   oxyCODONE (OXY IR/ROXICODONE) 5 MG immediate release tablet 240 tablet 0    Sig: Take 1-2 tablets (5-10 mg  total) by mouth every 6 (six) hours as needed for severe pain.     Not Delegated - Analgesics:  Opioid Agonists Failed - 11/20/2021  2:08 PM      Failed - This refill cannot be delegated      Failed - Urine Drug Screen completed in last 360 days      Failed - Valid encounter within last 3 months    Recent Outpatient Visits           5 months ago Primary hypertension   [facility] Birdie Sons, MD   1 year ago Type 2 diabetes mellitus with diabetic nephropathy, without long-term current use of insulin ([place])   [facility] Birdie Sons, MD   1 year ago Type 2 diabetes mellitus with diabetic nephropathy, without long-term current use of insulin ([employer])   [facility] Birdie Sons, MD   1 year ago Type 2 diabetes mellitus with diabetic nephropathy, with long-term current use of insulin [facility])   [facility] Birdie Sons, MD   2 years ago Type 2 diabetes mellitus with diabetic nephropathy, without long-term current use of insulin [facility])   [facility] Caryn Section, Kirstie Peri, MD  Future Appointments             In 3 weeks Fisher, Kirstie Peri, MD [facility]   In 2 months Gollan, Kathlene November, MD [place]. [place]   In 3 months Vanga, Tally Due, MD [place]

## 2021-11-22 ENCOUNTER — Telehealth: Payer: Self-pay | Admitting: Family Medicine

## 2021-11-22 DIAGNOSIS — L905 Scar conditions and fibrosis of skin: Secondary | ICD-10-CM

## 2021-11-22 DIAGNOSIS — M79604 Pain in right leg: Secondary | ICD-10-CM

## 2021-11-22 NOTE — Telephone Encounter (Signed)
Copied from [place] [id number]. Topic: General - Other >> Nov 22, 2021  3:02 PM Everette C wrote: Reason for CRM: The patient has been directed to contact their PCP and follow up on their request for oxyCODONE (OXY IR/ROXICODONE) 5 MG immediate release tablet [[id number]]   Please contact the patient further when possible to discuss the status of their refill

## 2021-11-23 DIAGNOSIS — Z794 Long term (current) use of insulin: Secondary | ICD-10-CM

## 2021-11-23 DIAGNOSIS — F17210 Nicotine dependence, cigarettes, uncomplicated: Secondary | ICD-10-CM

## 2021-11-23 DIAGNOSIS — I129 Hypertensive chronic kidney disease with stage 1 through stage 4 chronic kidney disease, or unspecified chronic kidney disease: Secondary | ICD-10-CM | POA: Diagnosis not present

## 2021-11-23 DIAGNOSIS — E785 Hyperlipidemia, unspecified: Secondary | ICD-10-CM | POA: Diagnosis not present

## 2021-11-23 DIAGNOSIS — E1122 Type 2 diabetes mellitus with diabetic chronic kidney disease: Secondary | ICD-10-CM

## 2021-11-23 DIAGNOSIS — N1831 Chronic kidney disease, stage 3a: Secondary | ICD-10-CM

## 2021-11-24 MED ORDER — OXYCODONE HCL 5 MG PO TABS: 5.0000 mg | ORAL_TABLET | Freq: Four times a day (QID) | ORAL | 0 refills | Status: DC | PRN

## 2021-11-24 NOTE — Telephone Encounter (Signed)
Pt checking status of refill request  Pt requesting a cb to discuss request  Please fu w/ pt

## 2021-11-28 DIAGNOSIS — Z794 Long term (current) use of insulin: Secondary | ICD-10-CM | POA: Diagnosis not present

## 2021-11-28 DIAGNOSIS — E1129 Type 2 diabetes mellitus with other diabetic kidney complication: Secondary | ICD-10-CM | POA: Diagnosis not present

## 2021-11-28 DIAGNOSIS — E1169 Type 2 diabetes mellitus with other specified complication: Secondary | ICD-10-CM | POA: Diagnosis not present

## 2021-11-28 DIAGNOSIS — E785 Hyperlipidemia, unspecified: Secondary | ICD-10-CM | POA: Diagnosis not present

## 2021-11-28 DIAGNOSIS — I152 Hypertension secondary to endocrine disorders: Secondary | ICD-10-CM | POA: Diagnosis not present

## 2021-11-28 DIAGNOSIS — E1159 Type 2 diabetes mellitus with other circulatory complications: Secondary | ICD-10-CM | POA: Diagnosis not present

## 2021-11-28 LAB — HEMOGLOBIN A1C: Hemoglobin A1C: 6.9

## 2021-12-04 ENCOUNTER — Ambulatory Visit (INDEPENDENT_AMBULATORY_CARE_PROVIDER_SITE_OTHER): Payer: Medicare HMO

## 2021-12-04 ENCOUNTER — Telehealth: Payer: Self-pay

## 2021-12-04 DIAGNOSIS — Z794 Long term (current) use of insulin: Secondary | ICD-10-CM

## 2021-12-04 DIAGNOSIS — F17200 Nicotine dependence, unspecified, uncomplicated: Secondary | ICD-10-CM

## 2021-12-04 NOTE — Progress Notes (Signed)
Chronic Care Management Pharmacy Note  12/04/2021 Name:  James Moreno MRN:  [id number] DOB:  [birth year]  Summary: Patient presents for CCM follow-up.   -Patient and CGM reports frequent hypoglycemia all throughout the day. Patient has not given his Tyler Aas or his Novolog but continues to experience frequent lows over the weekend.  He is treating his low blood sugars with glucose tablets.   -We discussed adding a snack between his morning and evening meal, and adding a snack with some protein (PB&J or cheese + crackers) after treating a low blood sugar.   -Patient set a smoking quit date for 12/09/21.  Recommendations/Changes made from today's visit: -STOP Tresiba, STOP Novolog for now while hypoglycemic.   -Reached out to Dr. Honor Junes to inform him of above changes in patient's blood sugars following his visit.   Plan: [name] follow-up 1 week  Subjective: James Moreno is an 56 y.o. year old male who is a primary patient of Fisher, Kirstie Peri, MD.  The CCM team was consulted for assistance with disease management and care coordination needs.    Engaged with patient by telephone for follow up visit in response to provider referral for pharmacy case management and/or care coordination services.   Consent to Services:  The patient was given information about Chronic Care Management services, agreed to services, and gave verbal consent prior to initiation of services.  Please see initial visit note for detailed documentation.   Patient Care Team: Birdie Sons, MD as PCP - General (Family Medicine) Rockey Situ Kathlene November, MD as PCP - Cardiology (Cardiology) Pa, Garrison (Optometry) Anthonette Legato, MD (Nephrology) Long, Thomes Cake, MD as Referring Physician (Vascular Surgery) Isaias Sakai, MD as Referring Physician (Ophthalmology) Lin Landsman, MD as Consulting Physician (Gastroenterology) Wellington Hampshire, MD as Consulting Physician (Cardiology) Germaine Pomfret, [facility] (Pharmacist) Garrel Ridgel, DPM as Consulting Physician (Podiatry)  Recent office visits: 06/13/21: Patient presented to Dr. Caryn Section for follow-up.    Recent consult visits: 11/28/21: Patient presented to Dr. Honor Junes (endocrinology). Ozempic 1 mg weekly.  10/25/21: Patient presented to Dr. Holley Raring (nephrology)  07/27/21: Patient presented to Dr. Honor Junes (endocrinology). BP 110/70. Ozempic 0.5 mg weekly.  07/10/21: Patient presented to Dr. Holley Raring (Nephrology).   Hospital visits: None in previous 6 months  Objective:  Lab Results  Component Value Date   CREATININE 1.72 (H) 06/20/2020   BUN 29 (H) 06/20/2020   GFRNONAA 39 (L) 09/17/2019   GFRAA 45 (L) 09/17/2019   NA 139 06/20/2020   K 4.2 06/20/2020   CALCIUM 10.7 (H) 06/20/2020   CO2 21 06/20/2020    Lab Results  Component Value Date/Time   HGBA1C 8.1 02/28/2021 12:00 AM   HGBA1C 8.8 05/25/2020 12:00 AM   MICROALBUR 100 12/31/2016 02:13 PM    Last diabetic Eye exam:  Lab Results  Component Value Date/Time   HMDIABEYEEXA Retinopathy (A) 06/14/2021 12:00 AM    Last diabetic Foot exam: No results found for: "HMDIABFOOTEX"   Lab Results  Component Value Date   CHOL 141 06/20/2020   HDL 46 06/20/2020   LDLCALC 53 06/20/2020   TRIG 268 (H) 06/20/2020   CHOLHDL 3.1 06/20/2020       Latest Ref Rng & Units 06/20/2020    9:03 AM 09/17/2019    2:17 PM 12/12/2018   12:00 AM  Hepatic Function  Total Protein 6.0 - 8.5 g/dL  6.5    Albumin 3.8 - 4.9 g/dL 4.2  4.3  AST 0 - 40 IU/L  9  14      ALT 0 - 44 IU/L  9  16      Alk Phosphatase 48 - 121 IU/L  131  95      Total Bilirubin 0.0 - 1.2 mg/dL  0.6       This result is from an external source.    Lab Results  Component Value Date/Time   TSH 1.34 12/12/2018 12:00 AM   TSH 1.72 06/13/2006 12:00 AM       Latest Ref Rng & Units 12/12/2018   12:00 AM 10/21/2015    1:10 PM 06/21/2013    2:29 PM  CBC  WBC  6.7     8.9  9.7   Hemoglobin 13.5 - 17.5 14.8      16.3  10.1   Hematocrit 41 - 53 43     46.7  28.9   Platelets 150 - 399 182     140  175      This result is from an external source.    No results found for: "VD25OH"  Clinical ASCVD: Yes  The 10-year ASCVD risk score (Arnett DK, et al., 2019) is: 18.7%   Values used to calculate the score:     Age: 9 years     Sex: Male     Is Non-Hispanic African American: No     Diabetic: Yes     Tobacco smoker: Yes     Systolic Blood Pressure: 315 mmHg     Is BP treated: Yes     HDL Cholesterol: 46 mg/dL     Total Cholesterol: 141 mg/dL       06/13/2021   10:27 AM 02/27/2021   10:59 AM 10/14/2020    3:41 PM  Depression screen PHQ 2/9  Decreased Interest 0 0 0  Down, Depressed, Hopeless 0 0 0  PHQ - 2 Score 0 0 0  Altered sleeping 0  0  Tired, decreased energy 0  0  Change in appetite 0  0  Feeling bad or failure about yourself  0  0  Trouble concentrating 0  0  Moving slowly or fidgety/restless 0  0  Suicidal thoughts 0  0  PHQ-9 Score 0  0  Difficult doing work/chores Not difficult at all  Not difficult at all      Social History   Tobacco Use  Smoking Status Every Day   Packs/day: 1.00   Years: 41.00   Total pack years: 41.00   Types: Cigarettes  Smokeless Tobacco Never  Tobacco Comments   since age 64.   BP Readings from Last 3 Encounters:  06/13/21 (!) 151/89  03/30/21 (!) 155/89  10/21/20 140/60   Pulse Readings from Last 3 Encounters:  06/13/21 90  03/30/21 97  10/21/20 89   Wt Readings from Last 3 Encounters:  08/23/21 238 lb (108 kg)  06/13/21 232 lb (105.2 kg)  03/30/21 230 lb 2 oz (104.4 kg)    Assessment/Interventions: Review of patient past medical history, allergies, medications, health status, including review of consultants reports, laboratory and other test data, was performed as part of comprehensive evaluation and provision of chronic care management services.   SDOH:  (Social Determinants of Health) assessments and interventions  performed: No; completed in Mar 2023.   CCM Care Plan  Allergies  Allergen Reactions   No Known Allergies     Medications Reviewed Today     Reviewed by Rip Harbour, PMAC (  Certified Comptroller) on 11/06/21 at 1122  Med List Status: <None>   Medication Order Taking? Sig Documenting Provider Last Dose Status Informant  alprazolam (XANAX) 2 MG tablet [id number]  Take 1 tablet (2 mg total) by mouth 3 (three) times daily as needed for sleep. Birdie Sons, MD  Active   amLODipine (NORVASC) 10 MG tablet [id number] No Take 10 mg by mouth daily.  [provider] Taking Active   amLODipine (NORVASC) 10 MG tablet [id number] No Take 1 tablet (10 mg total) by mouth daily. Birdie Sons, MD Taking Active   B-D ULTRAFINE III SHORT PEN 31G X 8 MM MISC [id number] No  [provider] Taking Active   BD INSULIN SYRINGE U/F 31G X 5/16" 1 ML MISC [id number] No  [provider] Taking Active   Blood Glucose Monitoring Suppl (GLUCOCOM BLOOD GLUCOSE MONITOR) DEVI [id number] No Frequency:ONCE   Dosage:0.0     Instructions:  Note:Dose: N/A [provider] Taking Active Self           Med Note Kenton Kingfisher, Enrique Sack T   Wed Oct 30, 2017 11:50 AM)    calcitRIOL (ROCALTROL) 0.25 MCG capsule [id number] No Take 1 capsule (0.25 mcg total) by mouth daily. Birdie Sons, MD Taking Active   carvedilol (COREG) 12.5 MG tablet [id number] No Take 12.5 mg by mouth 2 (two) times daily. [provider] Taking Active   cinacalcet (SENSIPAR) 30 MG tablet [id number] No Take 1 tablet (30 mg total) by mouth daily. Birdie Sons, MD Taking Active   dapagliflozin propanediol ([name]) 10 MG TABS tablet [id number] No Take 1 tablet (10 mg total) by mouth daily before breakfast. Patient receives through [name] through Dec 2023 Birdie Sons, MD Taking Active   EDEX 40 MCG injection [id number] No 40 mcg by Intracavitary route as needed.  [provider] Taking Active    fenofibrate (TRICOR) 145 MG tablet [id number] No  [provider] Taking Active   insulin aspart (NOVOLOG) 100 UNIT/ML injection [id number] No Inject 15 Units into the skin 3 (three) times daily before meals. [provider] Taking Active Self           Med Note Raeford Razor Aug 15, 2020  3:50 PM) Frances Maywood through [name] Patient Assistance through Dec 2022   insulin degludec [facility]) 100 UNIT/ML FlexTouch Pen [id number] No Inject 45 Units into the skin daily. [provider] Taking Active            Med Note Raeford Razor Aug 15, 2020  3:50 PM) Frances Maywood through [name] Patient Assistance through Dec 2022  Lancets (ONETOUCH DELICA PLUS [id number]) [place] [id number] No Use to monitor blood sugars four times daily as directed. DX E11.21, Z79.4 Birdie Sons, MD Taking Active   LANTUS SOLOSTAR 100 UNIT/ML Solostar Pen [id number] No  [provider] Taking Active   losartan (COZAAR) 100 MG tablet [id number] No Take 1 tablet (100 mg total) by mouth at bedtime. Birdie Sons, MD Taking Active   naloxone [facility]) nasal spray 4 mg/0.1 mL [id number] No Place 1 spray into the nose once.  [provider] Taking Active   nicotine (NICODERM CQ) 14 mg/24hr patch [id number] No Place 1 patch (14 mg total) onto the skin daily.  Patient not taking: Reported on 06/13/2021   Birdie Sons, MD Not Taking Active   NOVOLOG 100 UNIT/ML injection [id number] No  [provider] Taking Active  omeprazole (PRILOSEC) 20 MG capsule [id number]  Take by mouth. [provider]  Active   [facility] VERIO test strip [id number] No Use to monitor blood sugars four times daily as directed. DX E11.21, Z79.4 Birdie Sons, MD Taking Active   oxyCODONE (OXY IR/ROXICODONE) 5 MG immediate release tablet [id number]  Take 1-2 tablets (5-10 mg total) by mouth every 6 (six) hours as needed for severe pain. Birdie Sons, MD  Active    rosuvastatin (CRESTOR) 40 MG tablet [id number]  TAKE 1 TABLET EVERY DAY Gollan, Kathlene November, MD  Active   Semaglutide,0.25 or 0.5MG/DOS, (OZEMPIC, 0.25 OR 0.5 MG/DOSE,) 2 MG/1.5ML SOPN [id number] No Inject 0.25 mg into the skin for four weeks, then inject 0.5 mg into the skin weekly. Birdie Sons, MD Taking Active   sildenafil (REVATIO) 20 MG tablet [id number] No  [provider] Taking Active   tacrolimus (PROGRAF) 1 MG capsule [id number] No Take 3 mg by mouth 2 (two) times daily.  [provider] Taking Active Self           Med Note Kenton Kingfisher, Germaine Pomfret Oct 30, 2017 11:51 AM)    tretinoin (RETIN-A) 0.05 % cream [id number] No Apply topically as needed.  [provider] Taking Active   triamcinolone cream (KENALOG) 0.1 % [id number] No APPLY DAILY TO INFLAMED BUMPS AS NEEDED [provider] Taking Active   XYOSTED 34 MG/0.5ML Darden Palmer [id number] No  [provider] Taking Active             Patient Active Problem List   Diagnosis Date Noted   Coronary artery disease of native artery of native heart with stable angina pectoris ([place]) 06/21/2021   Chronic kidney disease, stage 3b ([place]) 06/21/2021   Chronic pain following surgery or procedure 11/15/2020   Proliferative retinopathy of left eye due to diabetes mellitus ([place]) 01/28/2019   Esophageal dysphagia    Benign essential hypertension 12/11/2018   Secondary hyperparathyroidism of renal origin ([place]) 12/11/2018   Severe tobacco use disorder 08/01/2018   Atherosclerosis of artery of extremity with ulceration ([place]) 07/23/2018   Gastroesophageal reflux disease 05/26/2018   PAD (peripheral artery disease) ([place]) 10/30/2017   Chronic ulcer of heel, right, with unspecified severity ([place]) 10/16/2017   Hepatitis B core antibody positive 07/03/2017   Hypertriglyceridemia 07/03/2017   Chronic, continuous use of opioids 03/28/2016   Anxiety 03/28/2016   Pain in surgical scar 11/07/2015   Bulging eyes  01/24/2015   Leg mass 01/24/2015   Renal transplant, status post 01/24/2015   Carotid arterial disease ([place]) 12/27/2014   Compulsive tobacco user syndrome 12/27/2014   Abnormal EKG 04/06/2013   Erectile dysfunction 11/29/2012   Obesity 11/28/2012   Type 2 diabetes mellitus with diabetic nephropathy ([place]) 11/28/2012   Hypertension 12/21/2011   Hyperlipidemia 12/21/2011   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 10/30/2011   Obstructive apnea 01/09/2011   History of other malignant neoplasm of skin 07/16/2006   Glaucoma 01/13/2006   Episodic paroxysmal anxiety disorder 03/26/1998    Immunization History  Administered Date(s) Administered   Influenza Split 12/05/2010   Influenza,inj,Quad PF,6+ Mos 12/31/2016, 01/27/2019, 01/02/2021   Influenza-Unspecified 12/26/2020   PFIZER(Purple Top)SARS-COV-2 Vaccination 06/18/2019, 07/09/2019, 02/22/2020, 01/31/2021   Pneumococcal Polysaccharide-23 10/24/2010   Tdap 12/05/2010    Conditions to be addressed/monitored:  Hypertension, Hyperlipidemia, Diabetes, Coronary Artery Disease, GERD, Anxiety, Tobacco use and History of Renal Transplant   Care Plan : General Pharmacy (Adult)  Updates made by Germaine Pomfret, [place] since  12/04/2021 12:00 AM     Problem: Hypertension, Hyperlipidemia, Diabetes, Coronary Artery Disease, GERD, Anxiety, Tobacco use and History of Renal Transplant   Priority: High     Long-Range Goal: Patient-Specific Goal   Start Date: 05/19/2020  Expected End Date: 12/05/2022  This Visit's Progress: On track  Recent Progress: On track  Priority: High  Note:   Current Barriers:  Unable to independently afford treatment regimen Unable to achieve control of Diabetes   Pharmacist Clinical Goal(s):  Over the next 90 days, patient will verbalize ability to afford treatment regimen achieve control of Diabetes as evidenced by A1c less than 7% through collaboration with PharmD and provider.   Interventions: 1:1  collaboration with Birdie Sons, MD regarding development and update of comprehensive plan of care as evidenced by provider attestation and co-signature Inter-disciplinary care team collaboration (see longitudinal plan of care) Comprehensive medication review performed; medication list updated in electronic medical record  Hypertension (BP goal <130/80) -Controlled -Current treatment: Amlodipine 10 mg daily Carvedilol 6.25 mg twice daily   Losartan 100 mg daily   -Medications previously tried: NA  -Current home readings:  -Denies hypotensive/hypertensive symptoms -Recommended to continue current medication  Hyperlipidemia: (LDL goal < 70) -History of PAD, CAD  -Controlled -Current treatment: Rosuvastatin 40 mg daily  -Medications previously tried: NA  -Educated on Importance of limiting foods high in cholesterol; -Recommended to continue current medication  Diabetes (A1c goal <7%) -Controlled -Managed by Dr. Honor Junes -Current medications: [name] 10 mg daily: Appropriate, Query effective Novolog 15 units three times daily + 2 units for every 50 units above 150: Appropriate, Query effective  Ozempic 0.5 mg on Tuesdays: Appropriate, Query effective Tresiba 50 units daily (0.44 u/kg): Appropriate, Query effective -Medications previously tried: Lantus (Formulary)  -Current home glucose readings  Target 6/13-6/26 7/19-8/1 8/29-9/11  Number of days worn ? 14 days 14 14 14   % of time active ? 70% 86% 75% 88%  Mean Glucose (mg/dL)  167 204 129  GMI  7.3% 8.2% 6.4%  Glycemic Variability (%CV) ?36% 42.5% 49.5% 42.9%  Time above >250 mg/dL <5% 12% 29% 5%  Time above 70-180 mg/dL <25% 25% 16% 13%  Time in range: 70-180 mg/dL >70% 62% 54% 75%  Time below 70 mg/dL <4% 1% 1% 7%  Time below 54 mg/dL <1%   0%  -Dietary Patterns: Dramatic changes to diet, cut out sodas, cut out fried foods, and has cut out late night snacking.  -Breakfast: Bologna Sandwich  -Supper: Home cooked  meal.  -Patient and CGM reports frequent hypoglycemia all throughout the day. Patient has not given his Tyler Aas or his Novolog but continues to experience frequent lows over the weekend.  He is treating his low blood sugars with glucose tablets.  -We discussed adding a snack between his morning and evening meal, and adding a snack with some protein (PB&J or cheese + crackers) after treating a low blood sugar.  -STOP Carlean Jews Novolog for now while hypoglycemic.   -Reached out to Dr. Honor Junes to inform him of above changes in patient's blood sugars following his visit.   Anxiety (Goal: Maintain stable mood and sleep) -Controlled -Current treatment: Alprazolam 2 mg three times daily as needed - Sleep  -Medications previously tried/failed: NA -PHQ9: 0 -GAD7: 5 -Still struggling with grief from his mother's death. Some days he feels ok but other times   Renal Transplant  (Goal: prevent rejection of kidney ) -Managed by Dr. Holley Raring  -Controlled -Current treatment  Mycophenolate 500 mg  2 tablets twice daily  Tacrolimus 1 mg 3 capsules twice daily  -Medications previously tried: Myfortic (cost) -Recommended to continue current medication  Tobacco use (Goal Quit smoking) -Uncontrolled -Previous quit attempts: Chantix (nightmares), nicotine gum (ulcers), nicotine patch (stickiness) -Current treatment  17-18 cigarettes daily  -Patient smokes Within 30 minutes of waking -Patient triggers include: stress and finishing a meal -Patient was in the preparation stage of quitting smoking with an expected quit date of May 31st. Unfortunately, patient's mother was hospitalized and patient decided to defer his plan.  Chronic Kidney Disease Stage 3a  -All medications assessed for renal dosing and appropriateness in chronic kidney disease. -Recommended to continue current medication  Patient Goals/Self-Care Activities Over the next 90 days, patient will:  - check glucose 2-3 times daily ,  document, and provide at future appointments -check blood pressure 2-3 times weekly , document, and provide at future appointments -decrease cigarette use   Follow Up Plan: Telephone follow up appointment with care management team member scheduled for:  12/11/2021 at 9:30 AM     Medication Assistance:  Joni Reining, Novolog obtained through [name] medication assistance program.  Enrollment ends Dec 2023  Wilder Glade obtained through [name] medication assistance program.  Enrollment ends Dec 2023  Patient's preferred pharmacy is:  [facility] [address] [place] - [place] [address] Phone: [phone] Fax: [phone]  [facility] (ESRD Bundle Only) - [place], [address] [place] [address] Phone: [phone] Fax: [place], [place] [address] [place] [address] Phone: [phone] Fax: [phone]  [place], [place] [address] Phone: [phone] Fax: [phone]  Uses pill box? Yes Pt endorses 100% compliance  We discussed: Current pharmacy is preferred with insurance plan and patient is satisfied with pharmacy services Patient decided to: Continue current medication management strategy  Care Plan and Follow Up Patient Decision:  Patient agrees to Care Plan and Follow-up.  Compliance/Adherence/Medication fill history: Care Gaps: HIV Shingrix Tdap Covid Booster   Star-Rating Drugs: Rosuvastatin 40 mg last filled on 07/20/2021 for a 90-Day supply via [place] Losartan 100 mg last filled on 06/05/2021 for a 90-Day supply via [place]: Telephone follow up appointment with care management team member scheduled for:  12/11/2021 at 9:30 AM  [name], PharmD, [place], [place] [phone]

## 2021-12-04 NOTE — Progress Notes (Signed)
Per Clinical Pharmacist, Can you please reach out to Dr. Honor Junes regarding [name]? I would like you to relay the following to his nurse:    "I spoke with [name] today for chronic care management. Patient reports he has been experiencing frequent hypoglycemia all throughout the day. He thinks this may be due to recent changes he has made in his diet. He has not given his Tyler Aas or his Novolog but continues to experience frequent lows over the weekend. I recommended that he continue holding his Novolog and Tyler Aas for now and to add a small snack between his morning and evening meals. can you please have Dr. Honor Junes review the patient's CGM data and reach out to the patient if there are any other interventions Dr. Honor Junes would recommend for the patient?"  Per Endocrinology office, they would send the message to Dr. Honor Junes nurse whom will send to the message to Dr. Honor Junes.Notified Clinical pharmacist.  Anderson Malta Clinical Pharmacist Assistant [phone]

## 2021-12-04 NOTE — Patient Instructions (Signed)
Visit Information It was great speaking with you today!  Please let me know if you have any questions about our visit.   Goals Addressed             This Visit's Progress    Monitor and Manage My Blood Sugar-Diabetes Type 2   On track    Timeframe:  Long-Range Goal Priority:  High Start Date: 05/17/2020                            Expected End Date: 11/17/2022                       Follow Up within 30 days    - check blood sugar at prescribed times - check blood sugar if I feel it is too high or too low - enter blood sugar readings and medication or insulin into daily log    Why is this important?   Checking your blood sugar at home helps to keep it from getting very high or very low.  Writing the results in a diary or log helps the doctor know how to care for you.  Your blood sugar log should have the time, date and the results.  Also, write down the amount of insulin or other medicine that you take.  Other information, like what you ate, exercise done and how you were feeling, will also be helpful.     Notes:         Patient Care Plan: General Pharmacy (Adult)     Problem Identified: Hypertension, Hyperlipidemia, Diabetes, Coronary Artery Disease, GERD, Anxiety, Tobacco use and History of Renal Transplant   Priority: High     Long-Range Goal: Patient-Specific Goal   Start Date: 05/19/2020  Expected End Date: 12/05/2022  This Visit's Progress: On track  Recent Progress: On track  Priority: High  Note:   Current Barriers:  Unable to independently afford treatment regimen Unable to achieve control of Diabetes   Pharmacist Clinical Goal(s):  Over the next 90 days, patient will verbalize ability to afford treatment regimen achieve control of Diabetes as evidenced by A1c less than 7% through collaboration with PharmD and provider.   Interventions: 1:1 collaboration with Birdie Sons, MD regarding development and update of comprehensive plan of care as evidenced by  provider attestation and co-signature Inter-disciplinary care team collaboration (see longitudinal plan of care) Comprehensive medication review performed; medication list updated in electronic medical record  Hypertension (BP goal <130/80) -Controlled -Current treatment: Amlodipine 10 mg daily Carvedilol 6.25 mg twice daily   Losartan 100 mg daily   -Medications previously tried: NA  -Current home readings:  -Denies hypotensive/hypertensive symptoms -Recommended to continue current medication  Hyperlipidemia: (LDL goal < 70) -History of PAD, CAD  -Controlled -Current treatment: Rosuvastatin 40 mg daily  -Medications previously tried: NA  -Educated on Importance of limiting foods high in cholesterol; -Recommended to continue current medication  Diabetes (A1c goal <7%) -Controlled -Managed by Dr. Honor Junes -Current medications: [name] 10 mg daily: Appropriate, Query effective Novolog 15 units three times daily + 2 units for every 50 units above 150: Appropriate, Query effective  Ozempic 0.5 mg on Tuesdays: Appropriate, Query effective Tresiba 50 units daily (0.44 u/kg): Appropriate, Query effective -Medications previously tried: Lantus (Formulary)  -Current home glucose readings  Target 6/13-6/26 7/19-8/1 8/29-9/11  Number of days worn ? 14 days '14 14 14  '$ % of time active ? 70% 86% 75%  88%  Mean Glucose (mg/dL)  167 204 129  GMI  7.3% 8.2% 6.4%  Glycemic Variability (%CV) ?36% 42.5% 49.5% 42.9%  Time above >250 mg/dL <5% 12% 29% 5%  Time above 70-180 mg/dL <25% 25% 16% 13%  Time in range: 70-180 mg/dL >70% 62% 54% 75%  Time below 70 mg/dL <4% 1% 1% 7%  Time below 54 mg/dL <1%   0%  -Dietary Patterns: Dramatic changes to diet, cut out sodas, cut out fried foods, and has cut out late night snacking.  -Breakfast: Bologna Sandwich  -Supper: Home cooked meal.  -Patient and CGM reports frequent hypoglycemia all throughout the day. Patient has not given his Tyler Aas or his  Novolog but continues to experience frequent lows over the weekend.  He is treating his low blood sugars with glucose tablets.  -We discussed adding a snack between his morning and evening meal, and adding a snack with some protein (PB&J or cheese + crackers) after treating a low blood sugar.  -STOP Carlean Jews Novolog for now while hypoglycemic.   -Reached out to Dr. Honor Junes to inform him of above changes in patient's blood sugars following his visit.   Anxiety (Goal: Maintain stable mood and sleep) -Controlled -Current treatment: Alprazolam 2 mg three times daily as needed - Sleep  -Medications previously tried/failed: NA -PHQ9: 0 -GAD7: 5 -Still struggling with grief from his mother's death. Some days he feels ok but other times   Renal Transplant  (Goal: prevent rejection of kidney ) -Managed by Dr. Holley Raring  -Controlled -Current treatment  Mycophenolate 500 mg 2 tablets twice daily  Tacrolimus 1 mg 3 capsules twice daily  -Medications previously tried: Myfortic (cost) -Recommended to continue current medication  Tobacco use (Goal Quit smoking) -Uncontrolled -Previous quit attempts: Chantix (nightmares), nicotine gum (ulcers), nicotine patch (stickiness) -Current treatment  17-18 cigarettes daily  -Patient smokes Within 30 minutes of waking -Patient triggers include: stress and finishing a meal -Patient was in the preparation stage of quitting smoking with an expected quit date of May 31st. Unfortunately, patient's mother was hospitalized and patient decided to defer his plan.  Chronic Kidney Disease Stage 3a  -All medications assessed for renal dosing and appropriateness in chronic kidney disease. -Recommended to continue current medication  Patient Goals/Self-Care Activities Over the next 90 days, patient will:  - check glucose 2-3 times daily , document, and provide at future appointments -check blood pressure 2-3 times weekly , document, and provide at future  appointments -decrease cigarette use   Follow Up Plan: Telephone follow up appointment with care management team member scheduled for:  12/11/2021 at 9:30 AM    Patient agreed to services and verbal consent obtained.   Patient verbalizes understanding of instructions and care plan provided today and agrees to view in [place]. Active MyChart status and patient understanding of how to access instructions and care plan via MyChart confirmed with patient.     [name], PharmD, Para [place], [name]  Clinical Pharmacist Practitioner  [facility] [phone]

## 2021-12-11 ENCOUNTER — Telehealth: Payer: Medicare HMO

## 2021-12-11 NOTE — Progress Notes (Deleted)
Chronic Care Management Pharmacy Note  12/11/2021 Name:  James Moreno MRN:  [id number] DOB:  12/11/1965  Summary: Patient presents for CCM follow-up.   -Patient and CGM reports frequent hypoglycemia all throughout the day. Patient has not given his Tyler Aas or his Novolog but continues to experience frequent lows over the weekend.  He is treating his low blood sugars with glucose tablets.   -We discussed adding a snack between his morning and evening meal, and adding a snack with some protein (PB&J or cheese + crackers) after treating a low blood sugar.   -Patient set a smoking quit date for 12/09/21.  Recommendations/Changes made from today's visit: -STOP Tresiba, STOP Novolog for now while hypoglycemic.   -Reached out to Dr. Honor Junes to inform him of above changes in patient's blood sugars following his visit.   Plan: [name] follow-up 1 week  Subjective: James Moreno is an 56 y.o. year old male who is a primary patient of Fisher, Kirstie Peri, MD.  The CCM team was consulted for assistance with disease management and care coordination needs.    Engaged with patient by telephone for follow up visit in response to provider referral for pharmacy case management and/or care coordination services.   Consent to Services:  The patient was given information about Chronic Care Management services, agreed to services, and gave verbal consent prior to initiation of services.  Please see initial visit note for detailed documentation.   Patient Care Team: Birdie Sons, MD as PCP - General (Family Medicine) Rockey Situ Kathlene November, MD as PCP - Cardiology (Cardiology) Pa, [place] (Optometry) Anthonette Legato, MD (Nephrology) Long, Thomes Cake, MD as Referring Physician (Vascular Surgery) Isaias Sakai, MD as Referring Physician (Ophthalmology) Lin Landsman, MD as Consulting Physician (Gastroenterology) Wellington Hampshire, MD as Consulting Physician (Cardiology) Germaine Pomfret, [facility] (Pharmacist) Garrel Ridgel, DPM as Consulting Physician (Podiatry)  Recent office visits: 06/13/21: Patient presented to Dr. Caryn Section for follow-up.    Recent consult visits: 11/28/21: Patient presented to Dr. Honor Junes (endocrinology). Ozempic 1 mg weekly.  10/25/21: Patient presented to Dr. Holley Raring (nephrology)  07/27/21: Patient presented to Dr. Honor Junes (endocrinology). BP 110/70. Ozempic 0.5 mg weekly.  07/10/21: Patient presented to Dr. Holley Raring (Nephrology).   Hospital visits: None in previous 6 months  Objective:  Lab Results  Component Value Date   CREATININE 1.72 (H) 06/20/2020   BUN 29 (H) 06/20/2020   GFRNONAA 39 (L) 09/17/2019   GFRAA 45 (L) 09/17/2019   NA 139 06/20/2020   K 4.2 06/20/2020   CALCIUM 10.7 (H) 06/20/2020   CO2 21 06/20/2020    Lab Results  Component Value Date/Time   HGBA1C 8.1 02/28/2021 12:00 AM   HGBA1C 8.8 05/25/2020 12:00 AM   MICROALBUR 100 12/31/2016 02:13 PM    Last diabetic Eye exam:  Lab Results  Component Value Date/Time   HMDIABEYEEXA Retinopathy (A) 06/14/2021 12:00 AM    Last diabetic Foot exam: No results found for: "HMDIABFOOTEX"   Lab Results  Component Value Date   CHOL 141 06/20/2020   HDL 46 06/20/2020   LDLCALC 53 06/20/2020   TRIG 268 (H) 06/20/2020   CHOLHDL 3.1 06/20/2020       Latest Ref Rng & Units 06/20/2020    9:03 AM 09/17/2019    2:17 PM 12/12/2018   12:00 AM  Hepatic Function  Total Protein 6.0 - 8.5 g/dL  6.5    Albumin 3.8 - 4.9 g/dL 4.2  4.3  AST 0 - 40 IU/L  9  14      ALT 0 - 44 IU/L  9  16      Alk Phosphatase 48 - 121 IU/L  131  95      Total Bilirubin 0.0 - 1.2 mg/dL  0.6       This result is from an external source.    Lab Results  Component Value Date/Time   TSH 1.34 12/12/2018 12:00 AM   TSH 1.72 06/13/2006 12:00 AM       Latest Ref Rng & Units 12/12/2018   12:00 AM 10/21/2015    1:10 PM 06/21/2013    2:29 PM  CBC  WBC  6.7     8.9  9.7   Hemoglobin 13.5 - 17.5 14.8      16.3  10.1   Hematocrit 41 - 53 43     46.7  28.9   Platelets 150 - 399 182     140  175      This result is from an external source.    No results found for: "VD25OH"  Clinical ASCVD: Yes  The 10-year ASCVD risk score (Arnett DK, et al., 2019) is: 18.7%   Values used to calculate the score:     Age: 77 years     Sex: Male     Is Non-Hispanic African American: No     Diabetic: Yes     Tobacco smoker: Yes     Systolic Blood Pressure: 801 mmHg     Is BP treated: Yes     HDL Cholesterol: 46 mg/dL     Total Cholesterol: 141 mg/dL       06/13/2021   10:27 AM 02/27/2021   10:59 AM 10/14/2020    3:41 PM  Depression screen PHQ 2/9  Decreased Interest 0 0 0  Down, Depressed, Hopeless 0 0 0  PHQ - 2 Score 0 0 0  Altered sleeping 0  0  Tired, decreased energy 0  0  Change in appetite 0  0  Feeling bad or failure about yourself  0  0  Trouble concentrating 0  0  Moving slowly or fidgety/restless 0  0  Suicidal thoughts 0  0  PHQ-9 Score 0  0  Difficult doing work/chores Not difficult at all  Not difficult at all      Social History   Tobacco Use  Smoking Status Every Day   Packs/day: 1.00   Years: 41.00   Total pack years: 41.00   Types: Cigarettes  Smokeless Tobacco Never  Tobacco Comments   since age 32.   BP Readings from Last 3 Encounters:  06/13/21 (!) 151/89  03/30/21 (!) 155/89  10/21/20 140/60   Pulse Readings from Last 3 Encounters:  06/13/21 90  03/30/21 97  10/21/20 89   Wt Readings from Last 3 Encounters:  08/23/21 238 lb (108 kg)  06/13/21 232 lb (105.2 kg)  03/30/21 230 lb 2 oz (104.4 kg)    Assessment/Interventions: Review of patient past medical history, allergies, medications, health status, including review of consultants reports, laboratory and other test data, was performed as part of comprehensive evaluation and provision of chronic care management services.   SDOH:  (Social Determinants of Health) assessments and interventions  performed: No; completed in Mar 2023.   CCM Care Plan  Allergies  Allergen Reactions   No Known Allergies     Medications Reviewed Today     Reviewed by Rip Harbour, PMAC (  Certified Comptroller) on 11/06/21 at 1122  Med List Status: <None>   Medication Order Taking? Sig Documenting Provider Last Dose Status Informant  alprazolam (XANAX) 2 MG tablet [id number]  Take 1 tablet (2 mg total) by mouth 3 (three) times daily as needed for sleep. Birdie Sons, MD  Active   amLODipine (NORVASC) 10 MG tablet [id number] No Take 10 mg by mouth daily.  [provider] Taking Active   amLODipine (NORVASC) 10 MG tablet [id number] No Take 1 tablet (10 mg total) by mouth daily. Birdie Sons, MD Taking Active   B-D ULTRAFINE III SHORT PEN 31G X 8 MM MISC [id number] No  [provider] Taking Active   BD INSULIN SYRINGE U/F 31G X 5/16" 1 ML MISC [id number] No  [provider] Taking Active   Blood Glucose Monitoring Suppl (GLUCOCOM BLOOD GLUCOSE MONITOR) DEVI [id number] No Frequency:ONCE   Dosage:0.0     Instructions:  Note:Dose: N/A [provider] Taking Active Self           Med Note Kenton Kingfisher, Enrique Sack T   Wed Oct 30, 2017 11:50 AM)    calcitRIOL (ROCALTROL) 0.25 MCG capsule [id number] No Take 1 capsule (0.25 mcg total) by mouth daily. Birdie Sons, MD Taking Active   carvedilol (COREG) 12.5 MG tablet [id number] No Take 12.5 mg by mouth 2 (two) times daily. [provider] Taking Active   cinacalcet (SENSIPAR) 30 MG tablet [id number] No Take 1 tablet (30 mg total) by mouth daily. Birdie Sons, MD Taking Active   dapagliflozin propanediol ([name]) 10 MG TABS tablet [id number] No Take 1 tablet (10 mg total) by mouth daily before breakfast. Patient receives through [name] through Dec 2023 Birdie Sons, MD Taking Active   EDEX 40 MCG injection [id number] No 40 mcg by Intracavitary route as needed.  [provider] Taking Active    fenofibrate (TRICOR) 145 MG tablet [id number] No  [provider] Taking Active   insulin aspart (NOVOLOG) 100 UNIT/ML injection [id number] No Inject 15 Units into the skin 3 (three) times daily before meals. [provider] Taking Active Self           Med Note Raeford Razor Aug 15, 2020  3:50 PM) Frances Maywood through [name] Patient Assistance through Dec 2022   insulin degludec [facility]) 100 UNIT/ML FlexTouch Pen [id number] No Inject 45 Units into the skin daily. [provider] Taking Active            Med Note Raeford Razor Aug 15, 2020  3:50 PM) Frances Maywood through [name] Patient Assistance through Dec 2022  Lancets (ONETOUCH DELICA PLUS MGQQPY19J) [place] [id number] No Use to monitor blood sugars four times daily as directed. DX E11.21, Z79.4 Birdie Sons, MD Taking Active   LANTUS SOLOSTAR 100 UNIT/ML Solostar Pen [id number] No  [provider] Taking Active   losartan (COZAAR) 100 MG tablet [id number] No Take 1 tablet (100 mg total) by mouth at bedtime. Birdie Sons, MD Taking Active   naloxone [facility]) nasal spray 4 mg/0.1 mL [id number] No Place 1 spray into the nose once.  [provider] Taking Active   nicotine (NICODERM CQ) 14 mg/24hr patch [id number] No Place 1 patch (14 mg total) onto the skin daily.  Patient not taking: Reported on 06/13/2021   Birdie Sons, MD Not Taking Active   NOVOLOG 100 UNIT/ML injection [id number] No  [provider] Taking Active  omeprazole (PRILOSEC) 20 MG capsule [id number]  Take by mouth. [provider]  Active   [facility] VERIO test strip [id number] No Use to monitor blood sugars four times daily as directed. DX E11.21, Z79.4 Birdie Sons, MD Taking Active   oxyCODONE (OXY IR/ROXICODONE) 5 MG immediate release tablet [id number]  Take 1-2 tablets (5-10 mg total) by mouth every 6 (six) hours as needed for severe pain. Birdie Sons, MD  Active    rosuvastatin (CRESTOR) 40 MG tablet [id number]  TAKE 1 TABLET EVERY DAY Gollan, Kathlene November, MD  Active   Semaglutide,0.25 or 0.5MG/DOS, (OZEMPIC, 0.25 OR 0.5 MG/DOSE,) 2 MG/1.5ML SOPN [id number] No Inject 0.25 mg into the skin for four weeks, then inject 0.5 mg into the skin weekly. Birdie Sons, MD Taking Active   sildenafil (REVATIO) 20 MG tablet [id number] No  [provider] Taking Active   tacrolimus (PROGRAF) 1 MG capsule [id number] No Take 3 mg by mouth 2 (two) times daily.  [provider] Taking Active Self           Med Note Kenton Kingfisher, Germaine Pomfret Oct 30, 2017 11:51 AM)    tretinoin (RETIN-A) 0.05 % cream [id number] No Apply topically as needed.  [provider] Taking Active   triamcinolone cream (KENALOG) 0.1 % [id number] No APPLY DAILY TO INFLAMED BUMPS AS NEEDED [provider] Taking Active   XYOSTED 26 MG/0.5ML Darden Palmer [id number] No  [provider] Taking Active             Patient Active Problem List   Diagnosis Date Noted   Coronary artery disease of native artery of native heart with stable angina pectoris ([place]) 06/21/2021   Chronic kidney disease, stage 3b ([place]) 06/21/2021   Chronic pain following surgery or procedure 11/15/2020   Proliferative retinopathy of left eye due to diabetes mellitus ([place]) 01/28/2019   Esophageal dysphagia    Benign essential hypertension 12/11/2018   Secondary hyperparathyroidism of renal origin ([place]) 12/11/2018   Severe tobacco use disorder 08/01/2018   Atherosclerosis of artery of extremity with ulceration ([place]) 07/23/2018   Gastroesophageal reflux disease 05/26/2018   PAD (peripheral artery disease) ([place]) 10/30/2017   Chronic ulcer of heel, right, with unspecified severity ([place]) 10/16/2017   Hepatitis B core antibody positive 07/03/2017   Hypertriglyceridemia 07/03/2017   Chronic, continuous use of opioids 03/28/2016   Anxiety 03/28/2016   Pain in surgical scar 11/07/2015   Bulging eyes  01/24/2015   Leg mass 01/24/2015   Renal transplant, status post 01/24/2015   Carotid arterial disease ([place]) 12/27/2014   Compulsive tobacco user syndrome 12/27/2014   Abnormal EKG 04/06/2013   Erectile dysfunction 11/29/2012   Obesity 11/28/2012   Type 2 diabetes mellitus with diabetic nephropathy ([place]) 11/28/2012   Hypertension 12/21/2011   Hyperlipidemia 12/21/2011   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 10/30/2011   Obstructive apnea 01/09/2011   History of other malignant neoplasm of skin 07/16/2006   Glaucoma 01/13/2006   Episodic paroxysmal anxiety disorder 03/26/1998    Immunization History  Administered Date(s) Administered   Influenza Split 12/05/2010   Influenza,inj,Quad PF,6+ Mos 12/31/2016, 01/27/2019, 01/02/2021   Influenza-Unspecified 12/26/2020   PFIZER(Purple Top)SARS-COV-2 Vaccination 06/18/2019, 07/09/2019, 02/22/2020, 01/31/2021   Pneumococcal Polysaccharide-23 10/24/2010   Tdap 12/05/2010    Conditions to be addressed/monitored:  Hypertension, Hyperlipidemia, Diabetes, Coronary Artery Disease, GERD, Anxiety, Tobacco use and History of Renal Transplant   There are no care plans that you recently modified to display for this patient.  Medication Assistance:  Joni Reining, Cira Servant obtained through [name] medication assistance program.  Enrollment ends Dec 2023  Wilder Glade obtained through [name] medication assistance program.  Enrollment ends Dec 2023  Patient's preferred pharmacy is:  [facility] [address] [place] [address] Phone: [phone] Fax: [phone]  [facility] (ESRD Bundle Only) - [place], [address] [place] [address] Phone: [phone] Fax: [facility] [place], [address] [place] [address] Phone: [phone] Fax: [phone]  [place], [place] [address] Phone: [phone] Fax: [phone]  Uses pill box? Yes Pt endorses 100% compliance  We discussed: Current pharmacy is preferred with insurance plan and patient is satisfied with pharmacy services Patient decided to: Continue current medication management strategy  Care Plan and Follow Up Patient Decision:  Patient agrees to Care Plan and Follow-up.  Compliance/Adherence/Medication fill history: Care Gaps: HIV Shingrix Tdap Covid Booster   Star-Rating Drugs: Rosuvastatin 40 mg last filled on 07/20/2021 for a 90-Day supply via [place] Losartan 100 mg last filled on 06/05/2021 for a 90-Day supply via [place]: Telephone follow up appointment with care management team member scheduled for:  12/11/2021 at 9:30 AM  [name], PharmD, BCACP, [name]  Clinical Pharmacist Practitioner  [facility] [phone]  Patient Care Plan: General Pharmacy (Adult)     Problem Identified: Hypertension, Hyperlipidemia, Diabetes, Coronary Artery Disease, GERD, Anxiety, Tobacco use and History of Renal Transplant   Priority: High     Long-Range Goal: Patient-Specific Goal   Start Date: 05/19/2020  Expected End Date: 12/05/2022  This Visit's Progress: On track  Recent Progress: On track  Priority: High  Note:      Current Barriers:  Unable to independently afford treatment regimen Unable to achieve control of Diabetes   Pharmacist Clinical Goal(s):  Over the next 90 days, patient will verbalize ability to afford treatment regimen achieve control of Diabetes as evidenced by A1c less than 7% through collaboration with PharmD and provider.   Interventions: 1:1 collaboration with Birdie Sons, MD regarding development and update of comprehensive plan of care as evidenced by provider attestation and co-signature Inter-disciplinary care team collaboration (see  longitudinal plan of care) Comprehensive medication review performed; medication list updated in electronic medical record  Hypertension (BP goal <130/80) -Controlled -Current treatment: Amlodipine 10 mg daily Carvedilol 6.25 mg twice daily   Losartan 100 mg daily   -Medications previously tried: NA  -Current home readings:  -Denies hypotensive/hypertensive symptoms -Recommended to continue current medication  Hyperlipidemia: (LDL goal < 70) -History of PAD, CAD  -Controlled -Current treatment: Rosuvastatin 40 mg daily  -Medications previously tried: NA  -Educated on Importance of limiting foods high in cholesterol; -Recommended to continue current medication  Diabetes (A1c goal <7%) -Controlled -Managed by Dr. Honor Junes -Current medications: [name] 10 mg daily: Appropriate, Query effective Novolog 15 units three times daily + 2 units for every 50 units above 150: Appropriate, Query effective  Ozempic 0.5 mg on Tuesdays: Appropriate, Query effective Tresiba 50 units daily (0.44 u/kg): Appropriate, Query effective -Medications previously tried: Lantus (Formulary)  -Current home glucose readings  Target 6/13-6/26 7/19-8/1 8/29-9/11 9/12-9/18  Number of days worn ? 14 days 14 14 14 14   % of time active ? 70% 86% 75% 88% 85%  Mean Glucose (mg/dL)  167 204 129 171  GMI  7.3% 8.2% 6.4% 7.4%  Glycemic Variability (%CV) ?36% 42.5% 49.5% 42.9% 31.4%  Time above >250 mg/dL <5% 12% 29% 5% 8%  Time above 70-180 mg/dL <25% 25% 16% 13% 35%  Time in range: 70-180 mg/dL >70% 62% 54% 75% 55%  Time below 70 mg/dL <4% 1% 1% 7% 2%  Time below 54 mg/dL <1%   0%   -Dietary Patterns: Dramatic changes to diet, cut out sodas, cut out fried foods, and has cut out late night snacking.  -   Anxiety (Goal: Maintain stable mood and sleep) -Controlled -Current treatment: Alprazolam 2 mg three times daily as needed - Sleep  -Medications previously tried/failed: NA -PHQ9: 0 -GAD7: 5 -Still  struggling with grief from his mother's death. Some days he feels ok but other times   Renal Transplant  (Goal: prevent rejection of kidney ) -Managed by Dr. Holley Raring  -Controlled -Current treatment  Mycophenolate 500 mg 2 tablets twice daily  Tacrolimus 1 mg 3 capsules twice daily  -Medications previously tried: Myfortic (cost) -Recommended to continue current medication  Tobacco use (Goal Quit smoking) -Uncontrolled -Previous quit attempts: Chantix (nightmares), nicotine gum (ulcers), nicotine patch (stickiness) -Current treatment  17-18 cigarettes daily  -Patient smokes Within 30 minutes of waking -Patient triggers include: stress and finishing a meal -Patient was in the preparation stage of quitting smoking with an expected quit date of May 31st. Unfortunately, patient's mother was hospitalized and patient decided to defer his plan.  Chronic Kidney Disease Stage 3a  -All medications assessed for renal dosing and appropriateness in chronic kidney disease. -Recommended to continue current medication  Patient Goals/Self-Care Activities Over the next 90 days, patient will:  - check glucose 2-3 times daily , document, and provide at future appointments -check blood pressure 2-3 times weekly , document, and provide at future appointments -decrease cigarette use   Follow Up Plan: ***

## 2021-12-15 ENCOUNTER — Ambulatory Visit: Payer: Medicare HMO | Admitting: Family Medicine

## 2021-12-20 ENCOUNTER — Other Ambulatory Visit: Payer: Self-pay | Admitting: Family Medicine

## 2021-12-20 DIAGNOSIS — M79604 Pain in right leg: Secondary | ICD-10-CM

## 2021-12-20 DIAGNOSIS — L905 Scar conditions and fibrosis of skin: Secondary | ICD-10-CM

## 2021-12-20 MED ORDER — OXYCODONE HCL 5 MG PO TABS: 5.0000 mg | ORAL_TABLET | Freq: Four times a day (QID) | ORAL | 0 refills | Status: DC | PRN

## 2021-12-20 NOTE — Telephone Encounter (Signed)
Medication Refill - Medication: oxyCODONE (OXY IR/ROXICODONE) 5 MG immediate release tablet    Has the patient contacted their pharmacy? yes (Agent: If no, request that the patient contact the pharmacy for the refill. If patient does not wish to contact the pharmacy document the reason why and proceed with request.) (Agent: If yes, when and what did the pharmacy advise?)contact pcp  Preferred Pharmacy (with phone number or street name):  [place], [place] Phone:  [phone]  Fax:  [phone]     Has the patient been seen for an appointment in the last year OR does the patient have an upcoming appointment? yes  Agent: Please be advised that RX refills may take up to 3 business days. We ask that you follow-up with your pharmacy.

## 2021-12-20 NOTE — Telephone Encounter (Signed)
Requested medication (s) are due for refill today: no  Requested medication (s) are on the active medication list: yes  Last refill:  11/24/21 #240/0  Future visit scheduled: yes  Notes to clinic:  Unable to refill per protocol, cannot delegate.    Requested Prescriptions  Pending Prescriptions Disp Refills   oxyCODONE (OXY IR/ROXICODONE) 5 MG immediate release tablet 240 tablet 0    Sig: Take 1-2 tablets (5-10 mg total) by mouth every 6 (six) hours as needed for severe pain.     Not Delegated - Analgesics:  Opioid Agonists Failed - 12/20/2021  9:24 AM      Failed - This refill cannot be delegated      Failed - Urine Drug Screen completed in last 360 days      Failed - Valid encounter within last 3 months    Recent Outpatient Visits           6 months ago Primary hypertension   [facility] Birdie Sons, MD   1 year ago Type 2 diabetes mellitus with diabetic nephropathy, without long-term current use of insulin ([place])   [facility] Birdie Sons, MD   1 year ago Type 2 diabetes mellitus with diabetic nephropathy, without long-term current use of insulin ([place])   [facility] Birdie Sons, MD   1 year ago Type 2 diabetes mellitus with diabetic nephropathy, with long-term current use of insulin ([place])   [facility] Birdie Sons, MD   2 years ago Type 2 diabetes mellitus with diabetic nephropathy, without long-term current use of insulin [facility])   [place], Kirstie Peri, MD       Future Appointments             In 1 week Fisher, Kirstie Peri, MD [facility]   In 1 month Gollan, Kathlene November, MD [place] [place]   In 2 months Vanga, Tally Due, MD [place]

## 2021-12-23 DIAGNOSIS — I129 Hypertensive chronic kidney disease with stage 1 through stage 4 chronic kidney disease, or unspecified chronic kidney disease: Secondary | ICD-10-CM | POA: Diagnosis not present

## 2021-12-23 DIAGNOSIS — N1831 Chronic kidney disease, stage 3a: Secondary | ICD-10-CM

## 2021-12-23 DIAGNOSIS — E785 Hyperlipidemia, unspecified: Secondary | ICD-10-CM | POA: Diagnosis not present

## 2021-12-23 DIAGNOSIS — E1122 Type 2 diabetes mellitus with diabetic chronic kidney disease: Secondary | ICD-10-CM | POA: Diagnosis not present

## 2021-12-23 DIAGNOSIS — F17210 Nicotine dependence, cigarettes, uncomplicated: Secondary | ICD-10-CM | POA: Diagnosis not present

## 2021-12-23 DIAGNOSIS — Z794 Long term (current) use of insulin: Secondary | ICD-10-CM

## 2021-12-25 ENCOUNTER — Telehealth: Payer: Self-pay

## 2021-12-25 NOTE — Progress Notes (Signed)
Chronic Care Management Pharmacy Assistant   Name: James Moreno  MRN: [id number] DOB: 1966/01/27  Reason for Encounter: Patient Assistance Renewal Applications for Joni Reining, and Valders, Valinda   The patient assistance application renewals for Medicare patients start the month of October. Applications for Medicare patient's for the 2024 year can start being submitted on or after 01/07/2022.  Patient is currently on patient assistance through [name] for his Joni Reining, and Novolog. Renewal application started and will be mailed to the patient's home address to complete and return to the PCP's Office along with his proof of income.  Patient also receives patient assistance for [name] though [name] at this time unless something changes [name] will do automatic renewals, and the [name] will just send an updated Prescription.  Task sent to [name] to send in updated script when the time comes.  [employer] e-mailed to PTM/[name] for mailing to the patient's home address for him to complete. Patient instructed on application to return application along with proof of income to [facility].  Medications: Outpatient Encounter Medications as of 12/25/2021  Medication Sig Note   alprazolam (XANAX) 2 MG tablet Take 1 tablet (2 mg total) by mouth 3 (three) times daily as needed for sleep.    amLODipine (NORVASC) 10 MG tablet Take 10 mg by mouth daily.     amLODipine (NORVASC) 10 MG tablet Take 1 tablet (10 mg total) by mouth daily.    B-D ULTRAFINE III SHORT PEN 31G X 8 MM MISC     BD INSULIN SYRINGE U/F 31G X 5/16" 1 ML MISC     Blood Glucose Monitoring Suppl (GLUCOCOM BLOOD GLUCOSE MONITOR) DEVI Frequency:ONCE   Dosage:0.0     Instructions:  Note:Dose: N/A    calcitRIOL (ROCALTROL) 0.25 MCG capsule Take 1 capsule (0.25 mcg total) by mouth daily.    carvedilol (COREG) 12.5 MG tablet Take 12.5 mg by mouth 2 (two) times daily.    cinacalcet (SENSIPAR) 30 MG tablet Take 1  tablet (30 mg total) by mouth daily.    dapagliflozin propanediol ([name]) 10 MG TABS tablet Take 1 tablet (10 mg total) by mouth daily before breakfast. Patient receives through [name] through Dec 2023    EDEX 40 MCG injection 40 mcg by Intracavitary route as needed.     fenofibrate (TRICOR) 145 MG tablet     insulin aspart (NOVOLOG) 100 UNIT/ML injection Inject 15 Units into the skin 3 (three) times daily before meals. 5/23/2022Frances Maywood through [name] Patient Assistance through Dec 2022    insulin degludec (TRESIBA FLEXTOUCH) 100 UNIT/ML FlexTouch Pen Inject 45 Units into the skin daily. 5/23/2022Frances Maywood through [name] Patient Assistance through Dec 2022   Lancets [facility] DELICA PLUS [id number]) MISC Use to monitor blood sugars four times daily as directed. DX E11.21, Z79.4    LANTUS SOLOSTAR 100 UNIT/ML Solostar Pen     losartan (COZAAR) 100 MG tablet Take 1 tablet (100 mg total) by mouth at bedtime.    naloxone (NARCAN) nasal spray 4 mg/0.1 mL Place 1 spray into the nose once.     nicotine (NICODERM CQ) 14 mg/24hr patch Place 1 patch (14 mg total) onto the skin daily. (Patient not taking: Reported on 06/13/2021)    NOVOLOG 100 UNIT/ML injection     omeprazole (PRILOSEC) 20 MG capsule Take by mouth.    ONETOUCH VERIO test strip Use to monitor blood sugars four times daily as directed. DX E11.21, Z79.4    oxyCODONE (OXY  IR/ROXICODONE) 5 MG immediate release tablet Take 1-2 tablets (5-10 mg total) by mouth every 6 (six) hours as needed for severe pain.    rosuvastatin (CRESTOR) 40 MG tablet TAKE 1 TABLET EVERY DAY    Semaglutide,0.25 or 0.'5MG'$ /DOS, (OZEMPIC, 0.25 OR 0.5 MG/DOSE,) 2 MG/1.5ML SOPN Inject 0.25 mg into the skin for four weeks, then inject 0.5 mg into the skin weekly.    sildenafil (REVATIO) 20 MG tablet     tacrolimus (PROGRAF) 1 MG capsule Take 3 mg by mouth 2 (two) times daily.     tretinoin (RETIN-A) 0.05 % cream Apply topically as needed.     triamcinolone cream  (KENALOG) 0.1 % APPLY DAILY TO INFLAMED BUMPS AS NEEDED    XYOSTED 50 MG/0.5ML SOAJ     No facility-administered encounter medications on file as of 12/25/2021.   Lynann Bologna, CPA/CMA Clinical Pharmacist Assistant Phone: [phone]

## 2021-12-25 NOTE — Progress Notes (Deleted)
Established patient visit   Patient: James Moreno   DOB: 21-Apr-1965   56 y.o. Male  MRN: [id number] Visit Date: 12/29/2021  Today's healthcare provider: Lelon Huh, MD   No chief complaint on file.  Subjective    HPI  Hypertension, follow-up  BP Readings from Last 3 Encounters:  06/13/21 (!) 151/89  03/30/21 (!) 155/89  10/21/20 140/60   Wt Readings from Last 3 Encounters:  08/23/21 238 lb (108 kg)  06/13/21 232 lb (105.2 kg)  03/30/21 230 lb 2 oz (104.4 kg)     He was last seen for hypertension 6 months ago.  BP at that visit was 151/89. Management since that visit includes advising patient to follow up with nephrology continue same meds for now.    He reports {excellent/good/fair/poor:19665} compliance with treatment. He {is/is not:9024} having side effects. {document side effects if present:1} He is following a {diet:21022986} diet. He {is/is not:9024} exercising. He {does/does not:200015} smoke.  Use of agents associated with hypertension: {bp agents assoc with hypertension:511::"none"}.   Outside blood pressures are {***enter patient reported home BP readings, or 'not being checked':1}. Symptoms: {Yes/No:20286} chest pain {Yes/No:20286} chest pressure  {Yes/No:20286} palpitations {Yes/No:20286} syncope  {Yes/No:20286} dyspnea {Yes/No:20286} orthopnea  {Yes/No:20286} paroxysmal nocturnal dyspnea {Yes/No:20286} lower extremity edema   Pertinent labs Lab Results  Component Value Date   CHOL 141 06/20/2020   HDL 46 06/20/2020   LDLCALC 53 06/20/2020   TRIG 268 (H) 06/20/2020   CHOLHDL 3.1 06/20/2020   Lab Results  Component Value Date   NA 139 06/20/2020   K 4.2 06/20/2020   CREATININE 1.72 (H) 06/20/2020   EGFR 46 (L) 06/20/2020   GLUCOSE 62 (L) 06/20/2020   TSH 1.34 12/12/2018     The 10-year ASCVD risk score (Arnett DK, et al., 2019) is:  18.7%  ---------------------------------------------------------------------------------------------------  Lipid/Cholesterol, Follow-up  Last lipid panel Other pertinent labs  Lab Results  Component Value Date   CHOL 141 06/20/2020   HDL 46 06/20/2020   LDLCALC 53 06/20/2020   TRIG 268 (H) 06/20/2020   CHOLHDL 3.1 06/20/2020   Lab Results  Component Value Date   ALT 9 09/17/2019   AST 9 09/17/2019   PLT 182 12/12/2018   TSH 1.34 12/12/2018     He was last seen for this 6 months ago.  Management since that visit includes continuing same medication.  He reports {excellent/good/fair/poor:19665} compliance with treatment. He {is/is not:9024} having side effects. {document side effects if present:1}  Symptoms: {Yes/No:20286} chest pain {Yes/No:20286} chest pressure/discomfort  {Yes/No:20286} dyspnea {Yes/No:20286} lower extremity edema  {Yes/No:20286} numbness or tingling of extremity {Yes/No:20286} orthopnea  {Yes/No:20286} palpitations {Yes/No:20286} paroxysmal nocturnal dyspnea  {Yes/No:20286} speech difficulty {Yes/No:20286} syncope   Current diet: {diet habits:16563} Current exercise: {exercise types:16438}  The 10-year ASCVD risk score (Arnett DK, et al., 2019) is: 18.7%  ---------------------------------------------------------------------------------------------------   Follow up for chronic pain:  The patient was last seen for this 6 months ago. Changes made at last visit include none; UDS done.  He reports {excellent/good/fair/poor:19665} compliance with treatment. He feels that condition is {improved/worse/unchanged:3041574}. He {is/is not:21021397} having side effects. ***  -----------------------------------------------------------------------------------------   Medications: Outpatient Medications Prior to Visit  Medication Sig   alprazolam (XANAX) 2 MG tablet Take 1 tablet (2 mg total) by mouth 3 (three) times daily as needed for sleep.   amLODipine  (NORVASC) 10 MG tablet Take 10 mg by mouth daily.    amLODipine (NORVASC) 10 MG tablet Take 1 tablet (10  mg total) by mouth daily.   B-D ULTRAFINE III SHORT PEN 31G X 8 MM MISC    BD INSULIN SYRINGE U/F 31G X 5/16" 1 ML MISC    Blood Glucose Monitoring Suppl (GLUCOCOM BLOOD GLUCOSE MONITOR) DEVI Frequency:ONCE   Dosage:0.0     Instructions:  Note:Dose: N/A   calcitRIOL (ROCALTROL) 0.25 MCG capsule Take 1 capsule (0.25 mcg total) by mouth daily.   carvedilol (COREG) 12.5 MG tablet Take 12.5 mg by mouth 2 (two) times daily.   cinacalcet (SENSIPAR) 30 MG tablet Take 1 tablet (30 mg total) by mouth daily.   dapagliflozin propanediol ([name]) 10 MG TABS tablet Take 1 tablet (10 mg total) by mouth daily before breakfast. Patient receives through [name] through Dec 2023   EDEX 40 MCG injection 40 mcg by Intracavitary route as needed.    fenofibrate (TRICOR) 145 MG tablet    insulin aspart (NOVOLOG) 100 UNIT/ML injection Inject 15 Units into the skin 3 (three) times daily before meals.   insulin degludec (TRESIBA FLEXTOUCH) 100 UNIT/ML FlexTouch Pen Inject 45 Units into the skin daily.   Lancets (ONETOUCH DELICA PLUS [id number]) MISC Use to monitor blood sugars four times daily as directed. DX E11.21, Z79.4   LANTUS SOLOSTAR 100 UNIT/ML Solostar Pen    losartan (COZAAR) 100 MG tablet Take 1 tablet (100 mg total) by mouth at bedtime.   naloxone (NARCAN) nasal spray 4 mg/0.1 mL Place 1 spray into the nose once.    nicotine (NICODERM CQ) 14 mg/24hr patch Place 1 patch (14 mg total) onto the skin daily. (Patient not taking: Reported on 06/13/2021)   NOVOLOG 100 UNIT/ML injection    omeprazole (PRILOSEC) 20 MG capsule Take by mouth.   ONETOUCH VERIO test strip Use to monitor blood sugars four times daily as directed. DX E11.21, Z79.4   oxyCODONE (OXY IR/ROXICODONE) 5 MG immediate release tablet Take 1-2 tablets (5-10 mg total) by mouth every 6 (six) hours as needed for severe pain.   rosuvastatin (CRESTOR) 40  MG tablet TAKE 1 TABLET EVERY DAY   Semaglutide,0.25 or 0.5MG/DOS, (OZEMPIC, 0.25 OR 0.5 MG/DOSE,) 2 MG/1.5ML SOPN Inject 0.25 mg into the skin for four weeks, then inject 0.5 mg into the skin weekly.   sildenafil (REVATIO) 20 MG tablet    tacrolimus (PROGRAF) 1 MG capsule Take 3 mg by mouth 2 (two) times daily.    tretinoin (RETIN-A) 0.05 % cream Apply topically as needed.    triamcinolone cream (KENALOG) 0.1 % APPLY DAILY TO INFLAMED BUMPS AS NEEDED   XYOSTED 50 MG/0.5ML SOAJ    No facility-administered medications prior to visit.    Review of Systems  {Labs  Heme  Chem  Endocrine  Serology  Results Review (optional):23779}   Objective    There were no vitals taken for this visit. {Show previous vital signs (optional):23777}  Physical Exam  ***  No results found for any visits on 12/29/21.  Assessment & Plan     ***  No follow-ups on file.      {provider attestation***:1}   Lelon Huh, MD  [facility] [phone] (phone) [phone] (fax)  [place]

## 2021-12-26 ENCOUNTER — Ambulatory Visit (INDEPENDENT_AMBULATORY_CARE_PROVIDER_SITE_OTHER): Payer: Medicare HMO

## 2021-12-26 DIAGNOSIS — E1121 Type 2 diabetes mellitus with diabetic nephropathy: Secondary | ICD-10-CM

## 2021-12-26 DIAGNOSIS — F17200 Nicotine dependence, unspecified, uncomplicated: Secondary | ICD-10-CM

## 2021-12-26 NOTE — Progress Notes (Signed)
Chronic Care Management Pharmacy Note  12/26/2021 Name:  James Moreno MRN:  [id number] DOB:  1966/03/25  Summary: Patient presents for CCM follow-up.   -Blood sugars have resumed normal patterns, patient believes his hypoglycemia was due to a faulty sensor. He has resumed previous doses of Tresiba + Novolog and is planning to start Ozempic 1 mg as prescribed by Dr. Honor Junes   Recommendations/Changes made from today's visit: -Recommend decreasing Tresiba to 40 units daily to minimize risk of hypoglycemia when starting Ozempic 1 mg weekly.   Plan: [name] follow-up 1 month  Subjective: James Moreno is an 56 y.o. year old male who is a primary patient of Fisher, Kirstie Peri, MD.  The CCM team was consulted for assistance with disease management and care coordination needs.    Engaged with patient by telephone for follow up visit in response to provider referral for pharmacy case management and/or care coordination services.   Consent to Services:  The patient was given information about Chronic Care Management services, agreed to services, and gave verbal consent prior to initiation of services.  Please see initial visit note for detailed documentation.   Patient Care Team: Birdie Sons, MD as PCP - General (Family Medicine) Rockey Situ Kathlene November, MD as PCP - Cardiology (Cardiology) Pa, [place] (Optometry) Anthonette Legato, MD (Nephrology) Long, Thomes Cake, MD as Referring Physician (Vascular Surgery) Isaias Sakai, MD as Referring Physician (Ophthalmology) Lin Landsman, MD as Consulting Physician (Gastroenterology) Wellington Hampshire, MD as Consulting Physician (Cardiology) Germaine Pomfret, [facility] (Pharmacist) Garrel Ridgel, DPM as Consulting Physician (Podiatry)  Recent office visits: 06/13/21: Patient presented to Dr. Caryn Section for follow-up.    Recent consult visits: 11/28/21: Patient presented to Dr. Honor Junes (endocrinology). Ozempic 1 mg weekly.  10/25/21:  Patient presented to Dr. Holley Raring (nephrology)  07/27/21: Patient presented to Dr. Honor Junes (endocrinology). BP 110/70. Ozempic 0.5 mg weekly.  07/10/21: Patient presented to Dr. Holley Raring (Nephrology).   Hospital visits: None in previous 6 months  Objective:  Lab Results  Component Value Date   CREATININE 1.72 (H) 06/20/2020   BUN 29 (H) 06/20/2020   GFRNONAA 39 (L) 09/17/2019   GFRAA 45 (L) 09/17/2019   NA 139 06/20/2020   K 4.2 06/20/2020   CALCIUM 10.7 (H) 06/20/2020   CO2 21 06/20/2020    Lab Results  Component Value Date/Time   HGBA1C 8.1 02/28/2021 12:00 AM   HGBA1C 8.8 05/25/2020 12:00 AM   MICROALBUR 100 12/31/2016 02:13 PM    Last diabetic Eye exam:  Lab Results  Component Value Date/Time   HMDIABEYEEXA Retinopathy (A) 06/14/2021 12:00 AM    Last diabetic Foot exam: No results found for: "HMDIABFOOTEX"   Lab Results  Component Value Date   CHOL 141 06/20/2020   HDL 46 06/20/2020   LDLCALC 53 06/20/2020   TRIG 268 (H) 06/20/2020   CHOLHDL 3.1 06/20/2020       Latest Ref Rng & Units 06/20/2020    9:03 AM 09/17/2019    2:17 PM 12/12/2018   12:00 AM  Hepatic Function  Total Protein 6.0 - 8.5 g/dL  6.5    Albumin 3.8 - 4.9 g/dL 4.2  4.3    AST 0 - 40 IU/L  9  14      ALT 0 - 44 IU/L  9  16      Alk Phosphatase 48 - 121 IU/L  131  95      Total Bilirubin 0.0 - 1.2 mg/dL  0.6       This result is from an external source.    Lab Results  Component Value Date/Time   TSH 1.34 12/12/2018 12:00 AM   TSH 1.72 06/13/2006 12:00 AM       Latest Ref Rng & Units 12/12/2018   12:00 AM 10/21/2015    1:10 PM 06/21/2013    2:29 PM  CBC  WBC  6.7     8.9  9.7   Hemoglobin 13.5 - 17.5 14.8     16.3  10.1   Hematocrit 41 - 53 43     46.7  28.9   Platelets 150 - 399 182     140  175      This result is from an external source.    No results found for: "VD25OH"  Clinical ASCVD: Yes  The 10-year ASCVD risk score (Arnett DK, et al., 2019) is: 18.7%   Values used to  calculate the score:     Age: 58 years     Sex: Male     Is Non-Hispanic African American: No     Diabetic: Yes     Tobacco smoker: Yes     Systolic Blood Pressure: 161 mmHg     Is BP treated: Yes     HDL Cholesterol: 46 mg/dL     Total Cholesterol: 141 mg/dL       06/13/2021   10:27 AM 02/27/2021   10:59 AM 10/14/2020    3:41 PM  Depression screen PHQ 2/9  Decreased Interest 0 0 0  Down, Depressed, Hopeless 0 0 0  PHQ - 2 Score 0 0 0  Altered sleeping 0  0  Tired, decreased energy 0  0  Change in appetite 0  0  Feeling bad or failure about yourself  0  0  Trouble concentrating 0  0  Moving slowly or fidgety/restless 0  0  Suicidal thoughts 0  0  PHQ-9 Score 0  0  Difficult doing work/chores Not difficult at all  Not difficult at all      Social History   Tobacco Use  Smoking Status Every Day   Packs/day: 1.00   Years: 41.00   Total pack years: 41.00   Types: Cigarettes  Smokeless Tobacco Never  Tobacco Comments   since age 46.   BP Readings from Last 3 Encounters:  06/13/21 (!) 151/89  03/30/21 (!) 155/89  10/21/20 140/60   Pulse Readings from Last 3 Encounters:  06/13/21 90  03/30/21 97  10/21/20 89   Wt Readings from Last 3 Encounters:  08/23/21 238 lb (108 kg)  06/13/21 232 lb (105.2 kg)  03/30/21 230 lb 2 oz (104.4 kg)    Assessment/Interventions: Review of patient past medical history, allergies, medications, health status, including review of consultants reports, laboratory and other test data, was performed as part of comprehensive evaluation and provision of chronic care management services.   SDOH:  (Social Determinants of Health) assessments and interventions performed: No; completed in Mar 2023.   CCM Care Plan  Allergies  Allergen Reactions   No Known Allergies     Medications Reviewed Today     Reviewed by Rip Harbour, [facility] (Certified Podiatric Assistant) on 11/06/21 at 1122  Med List Status: <None>   Medication Order  Taking? Sig Documenting Provider Last Dose Status Informant  alprazolam (XANAX) 2 MG tablet [id number]  Take 1 tablet (2 mg total) by mouth 3 (three) times daily as needed for sleep. Lelon Huh  E, MD  Active   amLODipine (NORVASC) 10 MG tablet [id number] No Take 10 mg by mouth daily.  [provider] Taking Active   amLODipine (NORVASC) 10 MG tablet [id number] No Take 1 tablet (10 mg total) by mouth daily. Birdie Sons, MD Taking Active   B-D ULTRAFINE III SHORT PEN 31G X 8 MM MISC [id number] No  [provider] Taking Active   BD INSULIN SYRINGE U/F 31G X 5/16" 1 ML MISC [id number] No  [provider] Taking Active   Blood Glucose Monitoring Suppl (GLUCOCOM BLOOD GLUCOSE MONITOR) DEVI [id number] No Frequency:ONCE   Dosage:0.0     Instructions:  Note:Dose: N/A [provider] Taking Active Self           Med Note Kenton Kingfisher, Enrique Sack T   Wed Oct 30, 2017 11:50 AM)    calcitRIOL (ROCALTROL) 0.25 MCG capsule [id number] No Take 1 capsule (0.25 mcg total) by mouth daily. Birdie Sons, MD Taking Active   carvedilol (COREG) 12.5 MG tablet [id number] No Take 12.5 mg by mouth 2 (two) times daily. [provider] Taking Active   cinacalcet (SENSIPAR) 30 MG tablet [id number] No Take 1 tablet (30 mg total) by mouth daily. Birdie Sons, MD Taking Active   dapagliflozin propanediol ([name]) 10 MG TABS tablet [id number] No Take 1 tablet (10 mg total) by mouth daily before breakfast. Patient receives through [name] through Dec 2023 Birdie Sons, MD Taking Active   EDEX 40 MCG injection [id number] No 40 mcg by Intracavitary route as needed.  [provider] Taking Active   fenofibrate (TRICOR) 145 MG tablet [id number] No  [provider] Taking Active   insulin aspart (NOVOLOG) 100 UNIT/ML injection [id number] No Inject 15 Units into the skin 3 (three) times daily before meals. [provider] Taking Active Self           Med Note Raeford Razor Aug 15, 2020  3:50 PM) Frances [place]wood through [name] Patient Assistance through Dec 2022   insulin [facility]) 100 UNIT/ML FlexTouch Pen [id number] No Inject 45 Units into the skin daily. [provider] Taking Active            Med Note Raeford Razor Aug 15, 2020  3:50 PM) Frances Maywood through [name] Patient Assistance through Dec 2022  Lancets (ONETOUCH DELICA PLUS [id number]) [place] [id number] No Use to monitor blood sugars four times daily as directed. DX E11.21, Z79.4 Birdie Sons, MD Taking Active   LANTUS SOLOSTAR 100 UNIT/ML Solostar Pen [id number] No  [provider] Taking Active   losartan (COZAAR) 100 MG tablet [id number] No Take 1 tablet (100 mg total) by mouth at bedtime. Birdie Sons, MD Taking Active   naloxone [facility]) nasal spray 4 mg/0.1 mL [id number] No Place 1 spray into the nose once.  [provider] Taking Active   nicotine (NICODERM CQ) 14 mg/24hr patch [id number] No Place 1 patch (14 mg total) onto the skin daily.  Patient not taking: Reported on 06/13/2021   Birdie Sons, MD Not Taking Active   NOVOLOG 100 UNIT/ML injection [id number] No  [provider] Taking Active   omeprazole (PRILOSEC) 20 MG capsule [id number]  Take by mouth. [provider]  Active   [facility] VERIO test strip [id number] No Use to monitor blood sugars four times daily as directed. DX E11.21, Z79.4 Birdie Sons, MD Taking Active   oxyCODONE (OXY IR/ROXICODONE) 5 MG  immediate release tablet [id number]  Take 1-2 tablets (5-10 mg total) by mouth every 6 (six) hours as needed for severe pain. Birdie Sons, MD  Active   rosuvastatin (CRESTOR) 40 MG tablet [id number]  TAKE 1 TABLET EVERY DAY Gollan, Kathlene November, MD  Active   Semaglutide,0.25 or 0.5MG/DOS, (OZEMPIC, 0.25 OR 0.5 MG/DOSE,) 2 MG/1.5ML SOPN [id number] No Inject 0.25 mg into the skin for four weeks, then inject 0.5 mg into the skin weekly. Birdie Sons, MD Taking Active   sildenafil (REVATIO) 20 MG tablet [id number] No  [provider] Taking Active   tacrolimus (PROGRAF) 1 MG capsule [id number] No Take 3 mg by mouth 2 (two) times daily.  [provider] Taking Active Self           Med Note Kenton Kingfisher, Germaine Pomfret Oct 30, 2017 11:51 AM)    tretinoin (RETIN-A) 0.05 % cream [id number] No Apply topically as needed.  [provider] Taking Active   triamcinolone cream (KENALOG) 0.1 % [id number] No APPLY DAILY TO INFLAMED BUMPS AS NEEDED [provider] Taking Active   XYOSTED 62 MG/0.5ML Darden Palmer [id number] No  [provider] Taking Active             Patient Active Problem List   Diagnosis Date Noted   Coronary artery disease of native artery of native heart with stable angina pectoris ([place]) 06/21/2021   Chronic kidney disease, stage 3b ([place]) 06/21/2021   Chronic pain following surgery or procedure 11/15/2020   Proliferative retinopathy of left eye due to diabetes mellitus ([place]) 01/28/2019   Esophageal dysphagia    Benign essential hypertension 12/11/2018   Secondary hyperparathyroidism of renal origin ([place]) 12/11/2018   Severe tobacco use disorder 08/01/2018   Atherosclerosis of artery of extremity with ulceration ([place]) 07/23/2018   Gastroesophageal reflux disease 05/26/2018   PAD (peripheral artery disease) ([place]) 10/30/2017   Chronic ulcer of heel, right, with unspecified severity ([place]) 10/16/2017   Hepatitis B core antibody positive 07/03/2017   Hypertriglyceridemia 07/03/2017   Chronic, continuous use of opioids 03/28/2016   Anxiety 03/28/2016   Pain in surgical scar 11/07/2015   Bulging eyes 01/24/2015   Leg mass 01/24/2015   Renal transplant, status post 01/24/2015   Carotid arterial disease ([place]) 12/27/2014   Compulsive tobacco user syndrome 12/27/2014   Abnormal EKG 04/06/2013   Erectile dysfunction 11/29/2012   Obesity 11/28/2012   Type 2 diabetes mellitus with diabetic  nephropathy ([place]) 11/28/2012   Hypertension 12/21/2011   Hyperlipidemia 12/21/2011   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 10/30/2011   Obstructive apnea 01/09/2011   History of other malignant neoplasm of skin 07/16/2006   Glaucoma 01/13/2006   Episodic paroxysmal anxiety disorder 03/26/1998    Immunization History  Administered Date(s) Administered   Influenza Split 12/05/2010   Influenza,inj,Quad PF,6+ Mos 12/31/2016, 01/27/2019, 01/02/2021   Influenza-Unspecified 12/26/2020   PFIZER(Purple Top)SARS-COV-2 Vaccination 06/18/2019, 07/09/2019, 02/22/2020, 01/31/2021   Pneumococcal Polysaccharide-23 10/24/2010   Tdap 12/05/2010    Conditions to be addressed/monitored:  Hypertension, Hyperlipidemia, Diabetes, Coronary Artery Disease, GERD, Anxiety, Tobacco use and History of Renal Transplant   Care Plan : General Pharmacy (Adult)  Updates made by Germaine Pomfret, RPH since 12/26/2021 12:00 AM     Problem: Hypertension, Hyperlipidemia, Diabetes, Coronary Artery Disease, GERD, Anxiety, Tobacco use and History of Renal Transplant   Priority: High     Long-Range Goal: Patient-Specific Goal   Start Date: 05/19/2020  Expected End Date: 12/05/2022  This Visit's Progress:  On track  Recent Progress: On track  Priority: High  Note:   Current Barriers:  Unable to independently afford treatment regimen Unable to achieve control of Diabetes   Pharmacist Clinical Goal(s):  Over the next 90 days, patient will verbalize ability to afford treatment regimen achieve control of Diabetes as evidenced by A1c less than 7% through collaboration with PharmD and provider.   Interventions: 1:1 collaboration with Birdie Sons, MD regarding development and update of comprehensive plan of care as evidenced by provider attestation and co-signature Inter-disciplinary care team collaboration (see longitudinal plan of care) Comprehensive medication review performed; medication list updated in  electronic medical record  Hypertension (BP goal <130/80) -Controlled -Current treatment: Amlodipine 10 mg daily Carvedilol 6.25 mg twice daily   Losartan 100 mg daily   -Medications previously tried: NA  -Current home readings:  -Denies hypotensive/hypertensive symptoms -Recommended to continue current medication  Hyperlipidemia: (LDL goal < 70) -History of PAD, CAD  -Controlled -Current treatment: Rosuvastatin 40 mg daily  -Medications previously tried: NA  -Educated on Importance of limiting foods high in cholesterol; -Recommended to continue current medication  Diabetes (A1c goal <7%) -Controlled -Managed by Dr. Honor Junes -Current medications: [name] 10 mg daily: Appropriate, Query effective Novolog 15 units three times daily + 2 units for every 50 units above 150: Appropriate, Query effective  Ozempic 0.5 mg on Tuesdays: Appropriate, Query effective Tresiba 50 units daily (0.44 u/kg): Appropriate, Query effective -Medications previously tried: Lantus (Formulary)  -Current home glucose readings  Target 6/13-6/26 7/19-8/1 8/29-9/11 9/12-9/18  Number of days worn ? 14 days 14 14 14 14   % of time active ? 70% 86% 75% 88% 85%  Mean Glucose (mg/dL)  167 204 129 171  GMI  7.3% 8.2% 6.4% 7.4%  Glycemic Variability (%CV) ?36% 42.5% 49.5% 42.9% 31.4%  Time above >250 mg/dL <5% 12% 29% 5% 8%  Time above 70-180 mg/dL <25% 25% 16% 13% 35%  Time in range: 70-180 mg/dL >70% 62% 54% 75% 55%  Time below 70 mg/dL <4% 1% 1% 7% 2%  Time below 54 mg/dL <1%   0%   -Dietary Patterns: Dramatic changes to diet, cut out sodas, cut out fried foods, and has cut out late night snacking.  -Blood sugars have resumed normal patterns, patient believes his hypoglycemia was due to a faulty sensor. He has resumed previous doses of Tresiba + Novolog and is planning to start Ozempic 1 mg as prescribed by Dr. Honor Junes  -Recommend decreasing Tyler Aas to 40 units daily to minimize risk of hypoglycemia when  starting Ozempic 1 mg weekly.   Anxiety (Goal: Maintain stable mood and sleep) -Controlled -Current treatment: Alprazolam 2 mg three times daily as needed - Sleep  -Medications previously tried/failed: NA -PHQ9: 0 -GAD7: 5 -Still struggling with grief from his mother's death. Some days he feels ok but other times   Renal Transplant  (Goal: prevent rejection of kidney ) -Managed by Dr. Holley Raring  -Controlled -Current treatment  Mycophenolate 500 mg 2 tablets twice daily  Tacrolimus 1 mg 3 capsules twice daily  -Medications previously tried: Myfortic (cost) -Recommended to continue current medication  Tobacco use (Goal Quit smoking) -Uncontrolled -Previous quit attempts: Chantix (nightmares), nicotine gum (ulcers), nicotine patch (stickiness) -Current treatment  17-18 cigarettes daily  -Patient smokes Within 30 minutes of waking -Patient triggers include: stress and finishing a meal -Patient was in the preparation stage of quitting smoking with an expected quit date of May 31st. Unfortunately, patient's mother was hospitalized and patient decided  to defer his plan.  Chronic Kidney Disease Stage 3a  -All medications assessed for renal dosing and appropriateness in chronic kidney disease. -Recommended to continue current medication  Patient Goals/Self-Care Activities Over the next 90 days, patient will:  - check glucose 2-3 times daily , document, and provide at future appointments -check blood pressure 2-3 times weekly , document, and provide at future appointments -decrease cigarette use   Follow Up Plan: Telephone follow up appointment with care management team member scheduled for:  01/30/2022 at 3:45 PM    Medication Assistance:  Joni Reining, Novolog obtained through [name] medication assistance program.  Enrollment ends Dec 2023  Wilder Glade obtained through [name] medication assistance program.  Enrollment ends Dec 2023  Patient's preferred pharmacy is:  [facility] [address], [place] - [place] [address] Phone: [phone] Fax: [phone]  [facility] (ESRD Bundle Only) - [place], [address] [place] [address] Phone: [phone] Fax: [place], [place] [address] [place] [address] Phone: [phone] Fax: [phone]  [place], [place] [address] Phone: [phone] Fax: [phone]  Uses pill box? Yes Pt endorses 100% compliance  We discussed: Current pharmacy is preferred with insurance plan and patient is satisfied with pharmacy services Patient decided to: Continue current medication management strategy  Care Plan and Follow Up Patient Decision:  Patient agrees to Care Plan and Follow-up.  Compliance/Adherence/Medication fill history: Care Gaps: HIV Shingrix Tdap Covid Booster   Star-Rating Drugs: Rosuvastatin 40 mg last filled on 07/20/2021 for a 90-Day supply via [place] Losartan 100 mg last filled on 06/05/2021 for a 90-Day supply via [place]: Telephone follow up appointment with care management team member scheduled for:  01/30/2022 at 3:45 PM  [name], PharmD, [place], [place] Pharmacist Practitioner  [facility] [phone]

## 2021-12-29 ENCOUNTER — Ambulatory Visit: Payer: Medicare HMO | Admitting: Family Medicine

## 2022-01-19 ENCOUNTER — Ambulatory Visit: Payer: Medicare HMO | Admitting: Family Medicine

## 2022-01-19 NOTE — Progress Notes (Deleted)
Established patient visit   Patient: James Moreno   DOB: 20-Sep-1965   56 y.o. Male  MRN: [id number] Visit Date: 01/19/2022  Today's healthcare provider: Lelon Huh, MD   No chief complaint on file.  Subjective    HPI  Diabetes Mellitus Type II, Follow-up  Lab Results  Component Value Date   HGBA1C 8.1 02/28/2021   HGBA1C 8.8 05/25/2020   HGBA1C 9.7 (H) 09/17/2019   Wt Readings from Last 3 Encounters:  08/23/21 238 lb (108 kg)  06/13/21 232 lb (105.2 kg)  03/30/21 230 lb 2 oz (104.4 kg)   Diabetes is managed by Endocrinology.   Most Recent Eye Exam: 06/14/2021   Pertinent Labs: Lab Results  Component Value Date   CHOL 141 06/20/2020   HDL 46 06/20/2020   LDLCALC 53 06/20/2020   TRIG 268 (H) 06/20/2020   CHOLHDL 3.1 06/20/2020   Lab Results  Component Value Date   NA 139 06/20/2020   K 4.2 06/20/2020   CREATININE 1.72 (H) 06/20/2020   EGFR 46 (L) 06/20/2020   MICROALBUR 100 12/31/2016     ---------------------------------------------------------------------------------------------------   Hypertension, follow-up  BP Readings from Last 3 Encounters:  06/13/21 (!) 151/89  03/30/21 (!) 155/89  10/21/20 140/60   Wt Readings from Last 3 Encounters:  08/23/21 238 lb (108 kg)  06/13/21 232 lb (105.2 kg)  03/30/21 230 lb 2 oz (104.4 kg)     He was last seen for hypertension 7 months ago.  BP at that visit was 151/89. Management since that visit includes continuing same medications.  He reports {excellent/good/fair/poor:19665} compliance with treatment. He {is/is not:9024} having side effects. {document side effects if present:1} He is following a {diet:21022986} diet. He {is/is not:9024} exercising. He {does/does not:200015} smoke.  Use of agents associated with hypertension: {bp agents assoc with hypertension:511::"none"}.   Outside blood pressures are {***enter patient reported home BP readings, or 'not being  checked':1}. Symptoms: {Yes/No:20286} chest pain {Yes/No:20286} chest pressure  {Yes/No:20286} palpitations {Yes/No:20286} syncope  {Yes/No:20286} dyspnea {Yes/No:20286} orthopnea  {Yes/No:20286} paroxysmal nocturnal dyspnea {Yes/No:20286} lower extremity edema   Pertinent labs Lab Results  Component Value Date   CHOL 141 06/20/2020   HDL 46 06/20/2020   LDLCALC 53 06/20/2020   TRIG 268 (H) 06/20/2020   CHOLHDL 3.1 06/20/2020   Lab Results  Component Value Date   NA 139 06/20/2020   K 4.2 06/20/2020   CREATININE 1.72 (H) 06/20/2020   EGFR 46 (L) 06/20/2020   GLUCOSE 62 (L) 06/20/2020   TSH 1.34 12/12/2018     The 10-year ASCVD risk score (Arnett DK, et al., 2019) is: 18.7%  ---------------------------------------------------------------------------------------------------   Follow up for chronic pain:  The patient was last seen for this 7 months ago. Changes made at last visit include none.  He reports {excellent/good/fair/poor:19665} compliance with treatment. He feels that condition is {improved/worse/unchanged:3041574}. He {is/is not:21021397} having side effects. ***  -----------------------------------------------------------------------------------------   Medications: Outpatient Medications Prior to Visit  Medication Sig   alprazolam (XANAX) 2 MG tablet Take 1 tablet (2 mg total) by mouth 3 (three) times daily as needed for sleep.   amLODipine (NORVASC) 10 MG tablet Take 10 mg by mouth daily.    amLODipine (NORVASC) 10 MG tablet Take 1 tablet (10 mg total) by mouth daily.   B-D ULTRAFINE III SHORT PEN 31G X 8 MM MISC    BD INSULIN SYRINGE U/F 31G X 5/16" 1 ML MISC    Blood Glucose Monitoring Suppl (GLUCOCOM BLOOD  GLUCOSE MONITOR) DEVI Frequency:ONCE   Dosage:0.0     Instructions:  Note:Dose: N/A   calcitRIOL (ROCALTROL) 0.25 MCG capsule Take 1 capsule (0.25 mcg total) by mouth daily.   carvedilol (COREG) 12.5 MG tablet Take 12.5 mg by mouth 2 (two) times  daily.   cinacalcet (SENSIPAR) 30 MG tablet Take 1 tablet (30 mg total) by mouth daily.   dapagliflozin propanediol ([name]) 10 MG TABS tablet Take 1 tablet (10 mg total) by mouth daily before breakfast. Patient receives through [name] through Dec 2023   EDEX 40 MCG injection 40 mcg by Intracavitary route as needed.    fenofibrate (TRICOR) 145 MG tablet    insulin aspart (NOVOLOG) 100 UNIT/ML injection Inject 15 Units into the skin 3 (three) times daily before meals.   insulin degludec (TRESIBA FLEXTOUCH) 100 UNIT/ML FlexTouch Pen Inject 45 Units into the skin daily.   Lancets (ONETOUCH DELICA PLUS ZOXWRU04V) MISC Use to monitor blood sugars four times daily as directed. DX E11.21, Z79.4   LANTUS SOLOSTAR 100 UNIT/ML Solostar Pen    losartan (COZAAR) 100 MG tablet Take 1 tablet (100 mg total) by mouth at bedtime.   naloxone (NARCAN) nasal spray 4 mg/0.1 mL Place 1 spray into the nose once.    nicotine (NICODERM CQ) 14 mg/24hr patch Place 1 patch (14 mg total) onto the skin daily. (Patient not taking: Reported on 06/13/2021)   NOVOLOG 100 UNIT/ML injection    omeprazole (PRILOSEC) 20 MG capsule Take by mouth.   ONETOUCH VERIO test strip Use to monitor blood sugars four times daily as directed. DX E11.21, Z79.4   oxyCODONE (OXY IR/ROXICODONE) 5 MG immediate release tablet Take 1-2 tablets (5-10 mg total) by mouth every 6 (six) hours as needed for severe pain.   rosuvastatin (CRESTOR) 40 MG tablet TAKE 1 TABLET EVERY DAY   Semaglutide,0.25 or 0.5MG/DOS, (OZEMPIC, 0.25 OR 0.5 MG/DOSE,) 2 MG/1.5ML SOPN Inject 0.25 mg into the skin for four weeks, then inject 0.5 mg into the skin weekly.   sildenafil (REVATIO) 20 MG tablet    tacrolimus (PROGRAF) 1 MG capsule Take 3 mg by mouth 2 (two) times daily.    tretinoin (RETIN-A) 0.05 % cream Apply topically as needed.    triamcinolone cream (KENALOG) 0.1 % APPLY DAILY TO INFLAMED BUMPS AS NEEDED   XYOSTED 50 MG/0.5ML SOAJ    No facility-administered  medications prior to visit.    Review of Systems  {Labs  Heme  Chem  Endocrine  Serology  Results Review (optional):23779}   Objective    There were no vitals taken for this visit. {Show previous vital signs (optional):23777}  Physical Exam  ***  No results found for any visits on 01/19/22.  Assessment & Plan     ***  No follow-ups on file.      {provider attestation***:1}   Lelon Huh, MD  [facility] [place] [phone] (phone) [phone] (fax)  [place]

## 2022-01-22 ENCOUNTER — Other Ambulatory Visit: Payer: Self-pay | Admitting: Family Medicine

## 2022-01-22 DIAGNOSIS — L905 Scar conditions and fibrosis of skin: Secondary | ICD-10-CM

## 2022-01-22 DIAGNOSIS — M79604 Pain in right leg: Secondary | ICD-10-CM

## 2022-01-22 NOTE — Telephone Encounter (Signed)
Medication Refill - Medication: oxyCODONE (OXY IR/ROXICODONE) 5 MG immediate release tablet   Has the patient contacted their pharmacy? No. (Agent: If no, request that the patient contact the pharmacy for the refill. If patient does not wish to contact the pharmacy document the reason why and proceed with request.) (Agent: If yes, when and what did the pharmacy advise?)  Preferred Pharmacy (with phone number or street name): [facility]  Has the patient been seen for an appointment in the last year OR does the patient have an upcoming appointment? Yes.    Agent: Please be advised that RX refills may take up to 3 business days. We ask that you follow-up with your pharmacy.

## 2022-01-23 DIAGNOSIS — Z794 Long term (current) use of insulin: Secondary | ICD-10-CM | POA: Diagnosis not present

## 2022-01-23 DIAGNOSIS — F17210 Nicotine dependence, cigarettes, uncomplicated: Secondary | ICD-10-CM | POA: Diagnosis not present

## 2022-01-23 DIAGNOSIS — E1122 Type 2 diabetes mellitus with diabetic chronic kidney disease: Secondary | ICD-10-CM

## 2022-01-23 DIAGNOSIS — I129 Hypertensive chronic kidney disease with stage 1 through stage 4 chronic kidney disease, or unspecified chronic kidney disease: Secondary | ICD-10-CM | POA: Diagnosis not present

## 2022-01-23 DIAGNOSIS — E785 Hyperlipidemia, unspecified: Secondary | ICD-10-CM

## 2022-01-23 DIAGNOSIS — N1831 Chronic kidney disease, stage 3a: Secondary | ICD-10-CM | POA: Diagnosis not present

## 2022-01-23 MED ORDER — OXYCODONE HCL 5 MG PO TABS: 5.0000 mg | ORAL_TABLET | Freq: Four times a day (QID) | ORAL | 0 refills | Status: DC | PRN

## 2022-01-23 NOTE — Telephone Encounter (Signed)
Requested medication (s) are due for refill today - yes  Requested medication (s) are on the active medication list -yes  Future visit scheduled -yes  Last refill: 12/20/21 #240  Notes to clinic: non delegated Rx  Requested Prescriptions  Pending Prescriptions Disp Refills   oxyCODONE (OXY IR/ROXICODONE) 5 MG immediate release tablet 240 tablet 0    Sig: Take 1-2 tablets (5-10 mg total) by mouth every 6 (six) hours as needed for severe pain.     Not Delegated - Analgesics:  Opioid Agonists Failed - 01/22/2022 11:25 AM      Failed - This refill cannot be delegated      Failed - Urine Drug Screen completed in last 360 days      Failed - Valid encounter within last 3 months    Recent Outpatient Visits           7 months ago Primary hypertension   [facility] Birdie Sons, MD   1 year ago Type 2 diabetes mellitus with diabetic nephropathy, without long-term current use of insulin ([place])   [facility] Birdie Sons, MD   1 year ago Type 2 diabetes mellitus with diabetic nephropathy, without long-term current use of insulin ([place])   [facility] Birdie Sons, MD   1 year ago Type 2 diabetes mellitus with diabetic nephropathy, with long-term current use of insulin ([place])   [facility] Birdie Sons, MD   2 years ago Type 2 diabetes mellitus with diabetic nephropathy, without long-term current use of insulin [facility])   [place], Kirstie Peri, MD       Future Appointments             In 1 week Gollan, Kathlene November, MD [place]. [place]   In 2 weeks Fisher, Kirstie Peri, MD [facility]   In 1 month Vanga, Tally Due, MD [facility]               Requested Prescriptions  Pending Prescriptions Disp Refills   oxyCODONE (OXY IR/ROXICODONE) 5 MG immediate release tablet 240 tablet 0    Sig: Take 1-2 tablets (5-10 mg  total) by mouth every 6 (six) hours as needed for severe pain.     Not Delegated - Analgesics:  Opioid Agonists Failed - 01/22/2022 11:25 AM      Failed - This refill cannot be delegated      Failed - Urine Drug Screen completed in last 360 days      Failed - Valid encounter within last 3 months    Recent Outpatient Visits           7 months ago Primary hypertension   [facility] Birdie Sons, MD   1 year ago Type 2 diabetes mellitus with diabetic nephropathy, without long-term current use of insulin ([place])   [facility] Birdie Sons, MD   1 year ago Type 2 diabetes mellitus with diabetic nephropathy, without long-term current use of insulin ([place])   [facility] Birdie Sons, MD   1 year ago Type 2 diabetes mellitus with diabetic nephropathy, with long-term current use of insulin [facility])   [facility] Birdie Sons, MD   2 years ago Type 2 diabetes mellitus with diabetic nephropathy, without long-term current use of insulin [facility])   [facility] Section, Kirstie Peri, MD  Future Appointments             In 1 week Gollan, Kathlene November, MD [place]. [place]   In 2 weeks Fisher, Kirstie Peri, MD [facility], [place]   In 1 month Vanga, Tally Due, MD [place]

## 2022-01-26 ENCOUNTER — Other Ambulatory Visit: Payer: Self-pay | Admitting: Cardiovascular Disease

## 2022-01-29 ENCOUNTER — Telehealth: Payer: Self-pay | Admitting: Family Medicine

## 2022-01-29 MED ORDER — ALPRAZOLAM 2 MG PO TABS: 2.0000 mg | ORAL_TABLET | Freq: Three times a day (TID) | ORAL | 0 refills | Status: DC | PRN

## 2022-01-29 NOTE — Telephone Encounter (Signed)
Please contact patient for overdue annual follow up. Last seen 10-21-20, refill request pending follow up appt.  Thank you.

## 2022-01-29 NOTE — Telephone Encounter (Signed)
alprazolam Duanne Moron) 2 MG tablet  Pt called in stating has not gotten refill, not seen since March 2023, refused to make a sooner appt, states he will be coming by office today as he will have seizures without, cancelled last 3 appt. [phone]

## 2022-01-29 NOTE — Progress Notes (Signed)
Evaluation Performed:  Follow-up visit  Date:  01/30/2022   ID:  James Moreno, DOB 08-14-1965, MRN [id number]  Patient Location:  [address] [place] [address]   Provider location:   [facility], [place] office  PCP:  [name], MD  Cardiologist:  [facility]  Chief Complaint  Patient presents with   12 month follow up     "Doing well." Medications reviewed by the patient verblaly.     History of Present Illness:    James Moreno is a 56 y.o. male  past medical history of Diabetes type 2 previously on peritoneal hemodialysis,   kidney transplant April 2016 in [place],  hypertension ,  long smoking history who continues to smoke,  Bilateral carotid disease,  50% LE arterial disease Covid 10/2019 evaluated for chest pain in the past, presenting for routine followup of his PAD.   Last seen in clinic July 2022 In follow-up today reports that he feels well Was rushing into the office today, blood pressure elevated Prior recordings in the chart also show high blood pressure Sometimes with pulsation in his ear at home, blood pressure elevated at those times  Hobbies include guitar, in band Rides his motorcycle  Weight trending down, on Ozempic Down 16 pounds from March 2023 Down over 20 pounds from May 2023  Labs reviewed A1C 8.8 to 7.2 to 6.9 CR 1.72 to 2.5 Total chol 119, LDL 25  Smoking 1 ppd, on last clinic visit reported he was going to start nicotine patches On today's visit reports he is going to start nicotine patches  Known carotid disease, moderate disease noted  EKG personally reviewed by myself on todays visit NSr rate 95 bpm, consider old anterior MI   Other past medical history  previously required iron infusions   history of  chronic discomfort in his legs. Reports having ABIs with Dr. Johny Chess   Echocardiogram over the past 6 months was essentially normal, normal ejection  fraction estimated at greater than 55% . This was done in preparation for kidney transplant listing .    stress test in 2013 showed no ischemia, done at [facility], repeat stress test in the past month or so at [facility]  Did stress test in the past on treadmill, had trouble Also did lexiscan 2015, had severe SOB    Past Medical History:  Diagnosis Date   Acute kidney failure, unspecified (HCC)    Anxiety    Diabetes mellitus, type 2 (HCC)    GERD (gastroesophageal reflux disease)    History of arterial disease of lower extremity    History of hepatitis B    Hypertension    Hypothyroidism    Mitral valve disorders(424.0)    Pityriasis 12/27/2014   Primary pulmonary HTN (HCC)    Pt denies ever having.   Tricuspid valve disorders, specified as nonrheumatic    Past Surgical History:  Procedure Laterality Date   APPENDECTOMY     Carotid Doppler Ultrasound  06/14/2009   39% stenosis of bilateral internal carotid artery, bilateral anterograde vertebral flow   ESOPHAGOGASTRODUODENOSCOPY (EGD) WITH PROPOFOL N/A 01/21/2019   Procedure: ESOPHAGOGASTRODUODENOSCOPY (EGD) WITH PROPOFOL;  Surgeon: Toney Reil, MD;  Location: [facility];  Service: Endoscopy;  Laterality: N/A;  Diabetic - insulin   EYE SURGERY     right   HEMORROIDECTOMY     KIDNEY TRANSPLANT Right 2016   MECKEL DIVERTICULUM EXCISION  infancy   Myocardial Perfusion scan  01/17/2009   [facility], non- ischemic. LVEF= 55%   PARS PLANA VITRECTOMY Left 02/09/2015   Procedure: Pan retinal photocoagulation 09811;  Surgeon: Marcelene Butte, MD;  Location: [facility];  Service: Ophthalmology;  Laterality: Left;   REFRACTIVE SURGERY Left    sleep study  01/09/2011   Severe sleep apnea. AHI 72.9/hr. RDI=83.0/hr. Desaturation to 69.0% Emergency CPAP titaration to 14.0cm (01/14/19 resolved after wt loss after kidney transplant.)     Current Meds  Medication Sig   alprazolam (XANAX) 2 MG tablet Take  1 tablet (2 mg total) by mouth 3 (three) times daily as needed for sleep.   amLODipine (NORVASC) 10 MG tablet Take 10 mg by mouth daily.    calcitRIOL (ROCALTROL) 0.25 MCG capsule Take 1 capsule (0.25 mcg total) by mouth daily.   carvedilol (COREG) 12.5 MG tablet Take 12.5 mg by mouth 2 (two) times daily.   cinacalcet (SENSIPAR) 30 MG tablet Take 1 tablet (30 mg total) by mouth daily.   dapagliflozin propanediol ([name]) 10 MG TABS tablet Take 1 tablet (10 mg total) by mouth daily before breakfast. Patient receives through [name] through Dec 2023   EDEX 40 MCG injection 40 mcg by Intracavitary route as needed.    fenofibrate (TRICOR) 145 MG tablet    insulin aspart (NOVOLOG) 100 UNIT/ML injection Inject 15 Units into the skin 3 (three) times daily before meals.   insulin degludec (TRESIBA FLEXTOUCH) 100 UNIT/ML FlexTouch Pen Inject 45 Units into the skin daily.   losartan (COZAAR) 100 MG tablet Take 1 tablet (100 mg total) by mouth at bedtime.   omeprazole (PRILOSEC) 20 MG capsule Take by mouth.   oxyCODONE (OXY IR/ROXICODONE) 5 MG immediate release tablet Take 1-2 tablets (5-10 mg total) by mouth every 6 (six) hours as needed for severe pain.   rosuvastatin (CRESTOR) 40 MG tablet TAKE 1 TABLET EVERY DAY   Semaglutide,0.25 or 0.5MG /DOS, (OZEMPIC, 0.25 OR 0.5 MG/DOSE,) 2 MG/1.5ML SOPN Inject 0.25 mg into the skin for four weeks, then inject 0.5 mg into the skin weekly.   sildenafil (REVATIO) 20 MG tablet    tacrolimus (PROGRAF) 1 MG capsule Take 3 mg by mouth 2 (two) times daily.    tretinoin (RETIN-A) 0.05 % cream Apply topically as needed.    triamcinolone cream (KENALOG) 0.1 % APPLY DAILY TO INFLAMED BUMPS AS NEEDED     Allergies:   No known allergies   Social History   Tobacco Use   Smoking status: Every Day    Packs/day: 1.00    Years: 41.00    Total pack years: 41.00    Types: Cigarettes   Smokeless tobacco: Never   Tobacco comments:    since age 13.  Vaping Use   Vaping Use:  Former  Substance Use Topics   Alcohol use: Yes    Alcohol/week: 0.0 - 1.0 standard drinks of alcohol    Comment: Excessive alcohol consumption in the past. Quit around 2016. Drinks occasionally.   Drug use: No     Family Hx: The patient's family history includes Hyperlipidemia in his mother; Hypertension in his mother; Melanoma in his father.  ROS:   Please see the history of present illness.    Review of Systems  Constitutional: Negative.   HENT: Negative.    Respiratory: Negative.    Cardiovascular: Negative.   Gastrointestinal: Negative.   Musculoskeletal: Negative.   Neurological: Negative.   Psychiatric/Behavioral: Negative.    All other systems reviewed and  are negative.    Labs/Other Tests and Data Reviewed:    Recent Labs: No results found for requested labs within last 365 days.   Recent Lipid Panel Lab Results  Component Value Date/Time   CHOL 141 06/20/2020 09:03 AM   TRIG 268 (H) 06/20/2020 09:03 AM   HDL 46 06/20/2020 09:03 AM   CHOLHDL 3.1 06/20/2020 09:03 AM   LDLCALC 53 06/20/2020 09:03 AM    Wt Readings from Last 3 Encounters:  01/30/22 216 lb (98 kg)  08/23/21 238 lb (108 kg)  06/13/21 232 lb (105.2 kg)     Exam:    Vital Signs: Vital signs may also be detailed in the HPI BP (!) 150/60 (BP Location: Left Arm, Patient Position: Sitting, Cuff Size: Normal)   Pulse 95   Ht 5\' 11"  (1.803 m)   Wt 216 lb (98 kg)   SpO2 98%   BMI 30.13 kg/m   Constitutional:  oriented to person, place, and time. No distress.  HENT:  Head: Grossly normal Eyes:  no discharge. No scleral icterus.  Neck: No JVD, no carotid bruits  Cardiovascular: Regular rate and rhythm, no murmurs appreciated Pulmonary/Chest: Clear to auscultation bilaterally, no wheezes or rails Abdominal: Soft.  no distension.  no tenderness.  Musculoskeletal: Normal range of motion Neurological:  normal muscle tone. Coordination normal. No atrophy Skin: Skin warm and dry Psychiatric:  normal affect, pleasant  ASSESSMENT & PLAN:    PAD (peripheral artery disease) (HCC) Chronic stable claudication symptoms, previously evaluated by Dr. Kirke [name] Cholesterol at goal, A1c improved, smoking cessation recommended  Atherosclerosis of artery of extremity with ulceration (HCC) Chronic stable claudication sx,  Recommended smoking cessation as above  Bilateral carotid artery disease, unspecified type (HCC) Stable moderate dx Smoking cessation recommended, cholesterol at goal Diabetes numbers much improved with weight loss/Ozempic  Mixed hyperlipidemia Cholesterol is at goal on the current lipid regimen. No changes to the medications were made.  Hypertension secondary to other renal disorders Blood pressure consistently running high, recommend increase carvedilol up to 18.5 twice daily May need to go up to 25 twice daily if blood pressure continues to run high     Total encounter time more than 30 minutes  Greater than 50% was spent in counseling and coordination of care with the patient   Signed, Julien Nordmann, MD  01/30/2022 1:54 PM    [facility] [address], [place], [place] [address]

## 2022-01-30 ENCOUNTER — Ambulatory Visit: Payer: Medicare HMO | Attending: Cardiovascular Disease | Admitting: Cardiovascular Disease

## 2022-01-30 ENCOUNTER — Ambulatory Visit (INDEPENDENT_AMBULATORY_CARE_PROVIDER_SITE_OTHER): Payer: Medicare HMO

## 2022-01-30 ENCOUNTER — Encounter: Payer: Self-pay | Admitting: Cardiovascular Disease

## 2022-01-30 VITALS — BP 150/60 | HR 95 | Ht 71.0 in | Wt 216.0 lb

## 2022-01-30 DIAGNOSIS — E785 Hyperlipidemia, unspecified: Secondary | ICD-10-CM

## 2022-01-30 DIAGNOSIS — I25118 Atherosclerotic heart disease of native coronary artery with other forms of angina pectoris: Secondary | ICD-10-CM

## 2022-01-30 DIAGNOSIS — I779 Disorder of arteries and arterioles, unspecified: Secondary | ICD-10-CM | POA: Diagnosis not present

## 2022-01-30 DIAGNOSIS — I10 Essential (primary) hypertension: Secondary | ICD-10-CM

## 2022-01-30 DIAGNOSIS — N186 End stage renal disease: Secondary | ICD-10-CM

## 2022-01-30 DIAGNOSIS — Z720 Tobacco use: Secondary | ICD-10-CM

## 2022-01-30 DIAGNOSIS — I739 Peripheral vascular disease, unspecified: Secondary | ICD-10-CM

## 2022-01-30 DIAGNOSIS — Z794 Long term (current) use of insulin: Secondary | ICD-10-CM

## 2022-01-30 MED ORDER — CARVEDILOL 12.5 MG PO TABS: 18.5000 mg | ORAL_TABLET | Freq: Two times a day (BID) | ORAL | 2 refills | Status: DC

## 2022-01-30 NOTE — Progress Notes (Signed)
Chronic Care Management Pharmacy Note  02/22/2022 Name:  James Moreno MRN:  [id number] DOB:  02-Oct-1965  Summary: Patient presents for CCM follow-up.   Recommendations/Changes made from today's visit: Continue current medications  Plan: [name] follow-up 1 month  Subjective: James Moreno is an 56 y.o. year old male who is a primary patient of Fisher, Kirstie Peri, MD.  The CCM team was consulted for assistance with disease management and care coordination needs.    Engaged with patient by telephone for follow up visit in response to provider referral for pharmacy case management and/or care coordination services.   Consent to Services:  The patient was given information about Chronic Care Management services, agreed to services, and gave verbal consent prior to initiation of services.  Please see initial visit note for detailed documentation.   Patient Care Team: Birdie Sons, MD as PCP - General (Family Medicine) Rockey Situ Kathlene November, MD as PCP - Cardiology (Cardiology) Pa, [place] (Optometry) Anthonette Legato, MD (Nephrology) Long, Thomes Cake, MD as Referring Physician (Vascular Surgery) Isaias Sakai, MD as Referring Physician (Ophthalmology) Lin Landsman, MD as Consulting Physician (Gastroenterology) Wellington Hampshire, MD as Consulting Physician (Cardiology) Germaine Pomfret, [facility] (Pharmacist) Garrel Ridgel, DPM as Consulting Physician (Podiatry)  Recent office visits: 06/13/21: Patient presented to Dr. Caryn Section for follow-up.    Recent consult visits: 01/30/22: Patient presented to Dr. Rockey Situ (cardiology) for follow-up. Carvedilol 18.75 mg twice daily.  11/28/21: Patient presented to Dr. Honor Junes (endocrinology). Ozempic 1 mg weekly.  10/25/21: Patient presented to Dr. Holley Raring (nephrology)  07/27/21: Patient presented to Dr. Honor Junes (endocrinology). BP 110/70. Ozempic 0.5 mg weekly.  07/10/21: Patient presented to Dr. Holley Raring (Nephrology).   Hospital  visits: None in previous 6 months  Objective:  Lab Results  Component Value Date   CREATININE 2.81 (H) 02/12/2022   BUN 36 (H) 02/12/2022   GFRNONAA 39 (L) 09/17/2019   GFRAA 45 (L) 09/17/2019   NA 130 (L) 02/12/2022   K 3.9 02/12/2022   CALCIUM 9.8 02/12/2022   CO2 20 02/12/2022    Lab Results  Component Value Date/Time   HGBA1C 6.9 11/28/2021 12:00 AM   HGBA1C 8.1 02/28/2021 12:00 AM   MICROALBUR 100 12/31/2016 02:13 PM    Last diabetic Eye exam:  Lab Results  Component Value Date/Time   HMDIABEYEEXA Retinopathy (A) 06/14/2021 12:00 AM    Last diabetic Foot exam: No results found for: "HMDIABFOOTEX"   Lab Results  Component Value Date   CHOL 141 06/20/2020   HDL 46 06/20/2020   LDLCALC 53 06/20/2020   TRIG 268 (H) 06/20/2020   CHOLHDL 3.1 06/20/2020       Latest Ref Rng & Units 02/12/2022    2:14 PM 06/20/2020    9:03 AM 09/17/2019    2:17 PM  Hepatic Function  Total Protein 6.0 - 8.5 g/dL   6.5   Albumin 3.8 - 4.9 g/dL 3.9  4.2  4.3   AST 0 - 40 IU/L   9   ALT 0 - 44 IU/L   9   Alk Phosphatase 48 - 121 IU/L   131   Total Bilirubin 0.0 - 1.2 mg/dL   0.6     Lab Results  Component Value Date/Time   TSH 1.34 12/12/2018 12:00 AM   TSH 1.72 06/13/2006 12:00 AM       Latest Ref Rng & Units 12/12/2018   12:00 AM 10/21/2015    1:10 PM 06/21/2013  2:29 PM  CBC  WBC  6.7     8.9  9.7   Hemoglobin 13.5 - 17.5 14.8     16.3  10.1   Hematocrit 41 - 53 43     46.7  28.9   Platelets 150 - 399 182     140  175      This result is from an external source.    No results found for: "VD25OH"  Clinical ASCVD: Yes  The 10-year ASCVD risk score (Arnett DK, et al., 2019) is: 19.7%   Values used to calculate the score:     Age: 42 years     Sex: Male     Is Non-Hispanic African American: No     Diabetic: Yes     Tobacco smoker: Yes     Systolic Blood Pressure: 053 mmHg     Is BP treated: Yes     HDL Cholesterol: 46 mg/dL     Total Cholesterol: 141 mg/dL        02/12/2022    1:35 PM 06/13/2021   10:27 AM 02/27/2021   10:59 AM  Depression screen PHQ 2/9  Decreased Interest 0 0 0  Down, Depressed, Hopeless 0 0 0  PHQ - 2 Score 0 0 0  Altered sleeping 0 0   Tired, decreased energy 1 0   Change in appetite 0 0   Feeling bad or failure about yourself  0 0   Trouble concentrating 0 0   Moving slowly or fidgety/restless 0 0   Suicidal thoughts 0 0   PHQ-9 Score 1 0   Difficult doing work/chores Not difficult at all Not difficult at all       Social History   Tobacco Use  Smoking Status Every Day   Packs/day: 1.00   Years: 41.00   Total pack years: 41.00   Types: Cigarettes  Smokeless Tobacco Never  Tobacco Comments   since age 43.   BP Readings from Last 3 Encounters:  02/12/22 138/70  01/30/22 (!) 150/60  06/13/21 (!) 151/89   Pulse Readings from Last 3 Encounters:  02/12/22 86  01/30/22 95  06/13/21 90   Wt Readings from Last 3 Encounters:  01/30/22 216 lb (98 kg)  08/23/21 238 lb (108 kg)  06/13/21 232 lb (105.2 kg)    Assessment/Interventions: Review of patient past medical history, allergies, medications, health status, including review of consultants reports, laboratory and other test data, was performed as part of comprehensive evaluation and provision of chronic care management services.   SDOH:  (Social Determinants of Health) assessments and interventions performed: No; completed in Mar 2023.   CCM Care Plan  Allergies  Allergen Reactions   No Known Allergies     Medications Reviewed Today     Reviewed by Michel Santee, CMA (Certified Medical Assistant) on 01/30/22 at 1350  Med List Status: <None>   Medication Order Taking? Sig Documenting Provider Last Dose Status Informant  alprazolam (XANAX) 2 MG tablet [id number] Yes Take 1 tablet (2 mg total) by mouth 3 (three) times daily as needed for sleep. Birdie Sons, MD Taking Active   amLODipine (NORVASC) 10 MG tablet [id number] Yes Take 10 mg by  mouth daily.  [provider] Taking Active   B-D ULTRAFINE III SHORT PEN 31G X 8 MM MISC [id number] No   Patient not taking: Reported on 01/30/2022   [provider] Not Taking Active   BD INSULIN SYRINGE U/F 31G X 5/16" 1  ML MISC [id number] No   Patient not taking: Reported on 01/30/2022   [provider] Not Taking Active   Blood Glucose Monitoring Suppl ([place]) DEVI [id number] No Frequency:ONCE   Dosage:0.0     Instructions:  Note:Dose: N/A  Patient not taking: Reported on 01/30/2022   [provider] Not Taking Active Self           Med Note Jeanie Cooks Oct 30, 2017 11:50 AM)    calcitRIOL (ROCALTROL) 0.25 MCG capsule [id number] Yes Take 1 capsule (0.25 mcg total) by mouth daily. Birdie Sons, MD Taking Active   carvedilol (COREG) 12.5 MG tablet [id number] Yes Take 12.5 mg by mouth 2 (two) times daily. [provider] Taking Active   cinacalcet (SENSIPAR) 30 MG tablet [id number] Yes Take 1 tablet (30 mg total) by mouth daily. Birdie Sons, MD Taking Active   dapagliflozin propanediol ([name]) 10 MG TABS tablet [id number] Yes Take 1 tablet (10 mg total) by mouth daily before breakfast. Patient receives through [name] through Dec 2023 Birdie Sons, MD Taking Active   EDEX 40 MCG injection [id number] Yes 40 mcg by Intracavitary route as needed.  [provider] Taking Active   fenofibrate (TRICOR) 145 MG tablet [id number] Yes  [provider] Taking Active   insulin aspart (NOVOLOG) 100 UNIT/ML injection [id number] Yes Inject 15 Units into the skin 3 (three) times daily before meals. [provider] Taking Active Self           Med Note Raeford Razor Aug 15, 2020  3:50 PM) Frances Maywood through [name] Patient Assistance through Dec 2022   insulin degludec [facility]) 100 UNIT/ML FlexTouch Pen [id number] Yes Inject 45 Units into the skin daily. [provider] Taking Active            Med Note Raeford Razor Aug 15, 2020  3:50 PM) Frances Maywood through [name] Patient Assistance through Dec 2022  Lancets (ONETOUCH DELICA PLUS OMBTDH74B) [place] [id number] No Use to monitor blood sugars four times daily as directed. DX E11.21, Z79.4  Patient not taking: Reported on 01/30/2022   Birdie Sons, MD Not Taking Active   LANTUS SOLOSTAR 100 UNIT/ML Solostar Pen [id number] No   Patient not taking: Reported on 01/30/2022   [provider] Not Taking Active   losartan (COZAAR) 100 MG tablet [id number] Yes Take 1 tablet (100 mg total) by mouth at bedtime. Birdie Sons, MD Taking Active   naloxone [facility]) nasal spray 4 mg/0.1 mL [id number] No Place 1 spray into the nose once.   Patient not taking: Reported on 01/30/2022   [provider] Not Taking Active   nicotine (NICODERM CQ) 14 mg/24hr patch [id number] No Place 1 patch (14 mg total) onto the skin daily.  Patient not taking: Reported on 06/13/2021   Birdie Sons, MD Not Taking Active   omeprazole (PRILOSEC) 20 MG capsule [id number] Yes Take by mouth. [provider] Taking Active   [facility] VERIO test strip [id number] No Use to monitor blood sugars four times daily as directed. DX E11.21, Z79.4  Patient not taking: Reported on 01/30/2022   Birdie Sons, MD Not Taking Active   oxyCODONE (OXY IR/ROXICODONE) 5 MG immediate release tablet [id number] Yes Take 1-2 tablets (5-10 mg total) by mouth every 6 (six) hours as needed for severe pain. Birdie Sons, MD Taking Active   rosuvastatin (CRESTOR) 40  MG tablet [id number] Yes TAKE 1 TABLET EVERY DAY Gollan, Kathlene November, MD Taking Active   Semaglutide,0.25 or 0.5MG/DOS, (OZEMPIC, 0.25 OR 0.5 MG/DOSE,) 2 MG/1.5ML SOPN [id number] Yes Inject 0.25 mg into the skin for four weeks, then inject 0.5 mg into the skin weekly. Birdie Sons, MD Taking Active   sildenafil (REVATIO) 20 MG tablet [id number] Yes  [provider] Taking Active   tacrolimus (PROGRAF) 1 MG capsule [id number] Yes Take 3 mg by mouth 2 (two) times daily.  [provider] Taking Active Self           Med Note Kenton Kingfisher, Germaine Pomfret Oct 30, 2017 11:51 AM)    tretinoin (RETIN-A) 0.05 % cream [id number] Yes Apply topically as needed.  [provider] Taking Active   triamcinolone cream (KENALOG) 0.1 % [id number] Yes APPLY DAILY TO INFLAMED BUMPS AS NEEDED [provider] Taking Active   XYOSTED 72 MG/0.5ML Darden Palmer [id number] No   Patient not taking: Reported on 01/30/2022   [provider] Not Taking Active             Patient Active Problem List   Diagnosis Date Noted   Coronary artery disease of native artery of native heart with stable angina pectoris ([place]) 06/21/2021   Chronic kidney disease, stage 3b ([place]) 06/21/2021   Chronic pain following surgery or procedure 11/15/2020   Proliferative retinopathy of left eye due to diabetes mellitus ([place]) 01/28/2019   Esophageal dysphagia    Benign essential hypertension 12/11/2018   Secondary hyperparathyroidism of renal origin ([place]) 12/11/2018   Severe tobacco use disorder 08/01/2018   Atherosclerosis of artery of extremity with ulceration ([place]) 07/23/2018   Gastroesophageal reflux disease 05/26/2018   PAD (peripheral artery disease) ([place]) 10/30/2017   Chronic ulcer of heel, right, with unspecified severity ([place]) 10/16/2017   Hepatitis B core antibody positive 07/03/2017   Hypertriglyceridemia 07/03/2017   Chronic, continuous use of opioids 03/28/2016   Anxiety 03/28/2016   Pain in surgical scar 11/07/2015   Bulging eyes 01/24/2015   Leg mass 01/24/2015   Renal transplant, status post 01/24/2015   Carotid arterial disease ([place]) 12/27/2014   Compulsive tobacco user syndrome 12/27/2014   Abnormal EKG 04/06/2013   Erectile dysfunction 11/29/2012   Obesity 11/28/2012   Type 2 diabetes mellitus with diabetic nephropathy ([place]) 11/28/2012   Hypertension  12/21/2011   Hyperlipidemia 12/21/2011   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 10/30/2011   Obstructive apnea 01/09/2011   History of other malignant neoplasm of skin 07/16/2006   Glaucoma 01/13/2006   Episodic paroxysmal anxiety disorder 03/26/1998    Immunization History  Administered Date(s) Administered   Influenza Split 12/05/2010   Influenza,inj,Quad PF,6+ Mos 12/31/2016, 01/27/2019, 01/02/2021   Influenza-Unspecified 12/26/2020   PFIZER(Purple Top)SARS-COV-2 Vaccination 06/18/2019, 07/09/2019, 02/22/2020, 01/31/2021   Pneumococcal Polysaccharide-23 10/24/2010   Tdap 12/05/2010    Conditions to be addressed/monitored:  Hypertension, Hyperlipidemia, Diabetes, Coronary Artery Disease, GERD, Anxiety, Tobacco use and History of Renal Transplant   Care Plan : General Pharmacy (Adult)  Updates made by Germaine Pomfret, RPH since 02/22/2022 12:00 AM     Problem: Hypertension, Hyperlipidemia, Diabetes, Coronary Artery Disease, GERD, Anxiety, Tobacco use and History of Renal Transplant   Priority: High     Long-Range Goal: Patient-Specific Goal   Start Date: 05/19/2020  Expected End Date: 12/05/2022  This Visit's Progress: On track  Recent Progress: On track  Priority: High  Note:   Current Barriers:  Unable to independently afford treatment regimen  Unable to achieve control of Diabetes   Pharmacist Clinical Goal(s):  Over the next 90 days, patient will verbalize ability to afford treatment regimen achieve control of Diabetes as evidenced by A1c less than 7% through collaboration with PharmD and provider.   Interventions: 1:1 collaboration with Birdie Sons, MD regarding development and update of comprehensive plan of care as evidenced by provider attestation and co-signature Inter-disciplinary care team collaboration (see longitudinal plan of care) Comprehensive medication review performed; medication list updated in electronic medical record  Hypertension (BP  goal <130/80) -Uncontrolled -Current treatment: Amlodipine 10 mg daily Carvedilol 12.5 mg twice daily   Losartan 100 mg daily   -Medications previously tried: NA  -Current home readings:  -Denies hypotensive/hypertensive symptoms -Recommended to continue current medication  Hyperlipidemia: (LDL goal < 70) -History of PAD, CAD  -Controlled -Current treatment: Rosuvastatin 40 mg daily  -Medications previously tried: NA  -Educated on Importance of limiting foods high in cholesterol; -Recommended to continue current medication  Diabetes (A1c goal <7%) -Controlled -Managed by Dr. Honor Junes -Current medications: [name] 10 mg daily: Appropriate, Query effective Novolog 15 units three times daily + 2 units for every 50 units above 150: Appropriate, Query effective  Ozempic 0.5 mg on Tuesdays: Appropriate, Query effective Tresiba 50 units daily (0.44 u/kg): Appropriate, Query effective -Medications previously tried: Lantus (Formulary)  -Current home glucose readings  Target 6/13-6/26 7/19-8/1 8/29-9/11 9/12-9/18  Number of days worn ? 14 days _0 % of time active ? 70% 86% 75% 88% 85%  Mean Glucose (mg/dL)  167 204 129 171  GMI  7.3% 8.2% 6.4% 7.4%  Glycemic Variability (%CV) ?36% 42.5% 49.5% 42.9% 31.4%  Time above >250 mg/dL <5% 12% 29% 5% 8%  Time above 70-180 mg/dL <25% 25% 16% 13% 35%  Time in range: 70-180 mg/dL >70% 62% 54% 75% 55%  Time below 70 mg/dL <4% 1% 1% 7% 2%  Time below 54 mg/dL <1%   0%   -Dietary Patterns: Dramatic changes to diet, cut out sodas, cut out fried foods, and has cut out late night snacking.  -Blood sugars have resumed normal patterns, patient believes his hypoglycemia was due to a faulty sensor. He has resumed previous doses of Tresiba + Novolog and is planning to start Ozempic 1 mg as prescribed by Dr. Honor Junes  -Recommend decreasing Tyler Aas to 40 units daily to minimize risk of hypoglycemia when starting Ozempic 1 mg weekly.   Anxiety  (Goal: Maintain stable mood and sleep) -Controlled -Current treatment: Alprazolam 2 mg three times daily as needed - Sleep  -Medications previously tried/failed: NA -PHQ9: 0 -GAD7: 5 -Still struggling with grief from his mother's death. Some days he feels ok but other times   Renal Transplant  (Goal: prevent rejection of kidney ) -Managed by Dr. Holley Raring  -Controlled -Current treatment  Mycophenolate 500 mg 2 tablets twice daily  Tacrolimus 1 mg 3 capsules twice daily  -Medications previously tried: Myfortic (cost) -Recommended to continue current medication  Tobacco use (Goal Quit smoking) -Uncontrolled -Previous quit attempts: Chantix (nightmares), nicotine gum (ulcers), nicotine patch (stickiness) -Current treatment  17-18 cigarettes daily  -Patient smokes Within 30 minutes of waking -Patient triggers include: stress and finishing a meal -Continue working to cut down on cigarette use.   Chronic Kidney Disease Stage 3a  -All medications assessed for renal dosing and appropriateness in chronic kidney disease. -Recommended to continue current medication  Patient Goals/Self-Care Activities Over the next 90 days, patient will:  - check glucose  2-3 times daily , document, and provide at future appointments -check blood pressure 2-3 times weekly , document, and provide at future appointments -decrease cigarette use   Follow Up Plan: Telephone follow up appointment with care management team member scheduled for:  03/02/2022 at 1:00 PM     Medication Assistance:  Joni Reining, Novolog obtained through [name] medication assistance program.  Enrollment ends Dec 2023  Wilder Glade obtained through [name] medication assistance program.  Enrollment ends Dec 2023  Patient's preferred pharmacy is:  [facility] [address] [place] [address] Phone: [phone] Fax: [phone]  [facility] (ESRD Bundle Only) - [place], [address] [place] [address] Phone: [phone] Fax: [place], [place] [address] [place] [address] Phone: [phone] Fax: [phone]  [place], [place] [address] Phone: [phone] Fax: [phone]  Uses pill box? Yes Pt endorses 100% compliance  We discussed: Current pharmacy is preferred with insurance plan and patient is satisfied with pharmacy services Patient decided to: Continue current medication management strategy  Care Plan and Follow Up Patient Decision:  Patient agrees to Care Plan and Follow-up.  Compliance/Adherence/Medication fill history: Care Gaps: HIV Shingrix Tdap Covid Booster   Star-Rating Drugs: Rosuvastatin 40 mg last filled on 07/20/2021 for a 90-Day supply via [place] Losartan 100 mg last filled on 06/05/2021 for a 90-Day supply via [place]: Telephone follow up appointment with care management team member scheduled for:  03/02/2022 at 1:00 PM  [name], PharmD, [place], [place] [phone]

## 2022-01-30 NOTE — Patient Instructions (Addendum)
Medication Instructions:   - Please increase the coreg up to 1 and a half tablets (18.5 mg) twice a day  If you need a refill on your cardiac medications before your next appointment, please call your pharmacy.   Lab work: No new labs needed  Testing/Procedures: No new testing needed  Follow-Up: At [facility], you and your health needs are our priority.  As part of our continuing mission to provide you with exceptional heart care, we have created designated Provider Care Teams.  These Care Teams include your primary Cardiologist (physician) and Advanced Practice Providers (APPs -  Physician Assistants and Nurse Practitioners) who all work together to provide you with the care you need, when you need it.  You will need a follow up appointment in 12 months  Providers on your designated Care Team:   Murray Hodgkins, NP Christell Faith, PA-C Cadence Kathlen Mody, [place]  COVID-19 Vaccine Information can be found at: ShippingScam.co.uk For questions related to vaccine distribution or appointments, please email [email or web address] or call [phone].

## 2022-01-30 NOTE — Telephone Encounter (Signed)
Patient has appointment scheduled today with Dr. Rockey Situ. Will you please refill if appropriate? Thank you!

## 2022-02-01 DIAGNOSIS — E1121 Type 2 diabetes mellitus with diabetic nephropathy: Secondary | ICD-10-CM | POA: Diagnosis not present

## 2022-02-04 ENCOUNTER — Other Ambulatory Visit: Payer: Self-pay | Admitting: Family Medicine

## 2022-02-04 DIAGNOSIS — N1832 Chronic kidney disease, stage 3b: Secondary | ICD-10-CM

## 2022-02-04 DIAGNOSIS — E1121 Type 2 diabetes mellitus with diabetic nephropathy: Secondary | ICD-10-CM

## 2022-02-04 DIAGNOSIS — I10 Essential (primary) hypertension: Secondary | ICD-10-CM

## 2022-02-07 ENCOUNTER — Ambulatory Visit: Payer: Medicare HMO | Admitting: Podiatry

## 2022-02-12 ENCOUNTER — Ambulatory Visit (INDEPENDENT_AMBULATORY_CARE_PROVIDER_SITE_OTHER): Payer: Medicare HMO | Admitting: Family Medicine

## 2022-02-12 VITALS — BP 138/70 | HR 86 | Temp 97.6°F

## 2022-02-12 DIAGNOSIS — R52 Pain, unspecified: Secondary | ICD-10-CM

## 2022-02-12 DIAGNOSIS — L905 Scar conditions and fibrosis of skin: Secondary | ICD-10-CM | POA: Diagnosis not present

## 2022-02-12 DIAGNOSIS — R3911 Hesitancy of micturition: Secondary | ICD-10-CM

## 2022-02-12 DIAGNOSIS — M2550 Pain in unspecified joint: Secondary | ICD-10-CM | POA: Diagnosis not present

## 2022-02-12 DIAGNOSIS — F1190 Opioid use, unspecified, uncomplicated: Secondary | ICD-10-CM | POA: Diagnosis not present

## 2022-02-12 DIAGNOSIS — N1832 Chronic kidney disease, stage 3b: Secondary | ICD-10-CM

## 2022-02-12 DIAGNOSIS — Z125 Encounter for screening for malignant neoplasm of prostate: Secondary | ICD-10-CM

## 2022-02-12 MED ORDER — TAMSULOSIN HCL 0.4 MG PO CAPS: 0.4000 mg | ORAL_CAPSULE | Freq: Every day | ORAL | 3 refills | Status: DC

## 2022-02-12 NOTE — Progress Notes (Signed)
I,Roshena L Chambers,acting as a scribe for Lelon Huh, MD.,have documented all relevant documentation on the behalf of Lelon Huh, MD,as directed by  Lelon Huh, MD while in the presence of Lelon Huh, MD.    Established patient visit   Patient: James Moreno   DOB: 08/03/1965   56 y.o. Male  MRN: [id number] Visit Date: 02/12/2022  Today's healthcare provider: Lelon Huh, MD    Subjective    HPI  Follow up for chronic pain:  He reports good compliance with treatment. He feels that condition is Worse. He is not having side effects.   His main complaint today is frequent urination, urinary hesitancy, bilateral flank pain, suprapubic pain and worsening of kidney functions when last checked by his endocrinologist.   He is also having a lot more pains in his joints, especially in his fingers over the last few months.  -----------------------------------------------------------------------------------------   Medications: Outpatient Medications Prior to Visit  Medication Sig   alprazolam (XANAX) 2 MG tablet Take 1 tablet (2 mg total) by mouth 3 (three) times daily as needed for sleep.   amLODipine (NORVASC) 10 MG tablet Take 10 mg by mouth daily.    BD INSULIN SYRINGE U/F 31G X 5/16" 1 ML MISC  (Patient not taking: Reported on 01/30/2022)   calcitRIOL (ROCALTROL) 0.25 MCG capsule Take 1 capsule (0.25 mcg total) by mouth daily.   carvedilol (COREG) 12.5 MG tablet Take 1.5 tablets (18.75 mg total) by mouth 2 (two) times daily.   cinacalcet (SENSIPAR) 30 MG tablet Take 1 tablet (30 mg total) by mouth daily.   dapagliflozin propanediol ([name]) 10 MG TABS tablet Take 1 tablet (10 mg total) by mouth daily before breakfast. Patient receives through [name] through Dec 2023   EDEX 40 MCG injection 40 mcg by Intracavitary route as needed.    fenofibrate (TRICOR) 145 MG tablet    insulin aspart (NOVOLOG) 100 UNIT/ML injection Inject 15 Units into the skin 3 (three) times  daily before meals.   insulin degludec (TRESIBA FLEXTOUCH) 100 UNIT/ML FlexTouch Pen Inject 45 Units into the skin daily.   losartan (COZAAR) 100 MG tablet Take 1 tablet (100 mg total) by mouth at bedtime.   naloxone (NARCAN) nasal spray 4 mg/0.1 mL Place 1 spray into the nose once.  (Patient not taking: Reported on 01/30/2022)   omeprazole (PRILOSEC) 20 MG capsule Take by mouth.   oxyCODONE (OXY IR/ROXICODONE) 5 MG immediate release tablet Take 1-2 tablets (5-10 mg total) by mouth every 6 (six) hours as needed for severe pain.   rosuvastatin (CRESTOR) 40 MG tablet TAKE 1 TABLET EVERY DAY   Semaglutide,0.25 or 0.5MG/DOS, (OZEMPIC, 0.25 OR 0.5 MG/DOSE,) 2 MG/1.5ML SOPN Inject 0.25 mg into the skin for four weeks, then inject 0.5 mg into the skin weekly. (Patient taking differently: 1 mg daily at 6 (six) AM.)   sildenafil (REVATIO) 20 MG tablet    tacrolimus (PROGRAF) 1 MG capsule Take 3 mg by mouth 2 (two) times daily.    tretinoin (RETIN-A) 0.05 % cream Apply topically as needed.    triamcinolone cream (KENALOG) 0.1 % APPLY DAILY TO INFLAMED BUMPS AS NEEDED   XYOSTED 50 MG/0.5ML SOAJ  (Patient not taking: Reported on 01/30/2022)   No facility-administered medications prior to visit.    Review of Systems  Constitutional:  Negative for appetite change, chills and fever.  Respiratory:  Negative for chest tightness, shortness of breath and wheezing.   Cardiovascular:  Negative for chest pain and palpitations.  Gastrointestinal:  Negative for abdominal pain, nausea and vomiting.       Objective    BP 138/70 (BP Location: Right Arm, Patient Position: Sitting, Cuff Size: Normal)   Pulse 86   Temp 97.6 F (36.4 C) (Oral)   SpO2 100% Comment: room air   Physical Exam  General Appearance:    Mildly obese male, alert, cooperative, in no acute distress  Eyes:    PERRL, conjunctiva/corneas clear, EOM's intact       Lungs:     Clear to auscultation bilaterally, respirations unlabored  Heart:     Normal heart rate. Normal rhythm. No murmurs, rubs, or gallops.    Abdomen:   soft, round, nontender. Slight suprapubic tenderness with mild bilateral CVAT       Assessment & Plan    1. Urinary hesitancy He is unable to provide urine sample today.   Will try tamsulosin (FLOMAX) 0.4 MG CAPS capsule; Take 1 capsule (0.4 mg total) by mouth daily.  Dispense: 30 capsule; Refill: 3  2. Prostate cancer screening  - PSA Total (Reflex To Free)  3. Chronic kidney disease, stage 3b (HCC) Slightly worsening from his baseline. Possible related to urinary retention. Not scheduled to see nephrology until the end of December. Will see how creatinine is now and consider renal ultrasound if still doing poorly.  - Renal function panel  4. Arthralgia, unspecified joint He requests increasing his pain medications. NSAIDs are not an option. Check  - Sed Rate (ESR) - Rheumatoid Factor - ANA Direct w/Reflex if Positive  5. Chronic, continuous use of opioids He is unable to provide a urine sample for drug screen.  - Pain Mgt Scrn (14 Drugs), Ur  6. Pain in surgical scar   Declined flu vaccine due to feeling poorly.     The entirety of the information documented in the History of Present Illness, Review of Systems and Physical Exam were personally obtained by me. Portions of this information were initially documented by the CMA and reviewed by me for thoroughness and accuracy.     Lelon Huh, MD  [facility] [phone] (phone) [phone] (fax)  [place]

## 2022-02-13 ENCOUNTER — Other Ambulatory Visit: Payer: Self-pay | Admitting: Family Medicine

## 2022-02-13 DIAGNOSIS — N179 Acute kidney failure, unspecified: Secondary | ICD-10-CM

## 2022-02-13 LAB — RENAL FUNCTION PANEL
Albumin: 3.9 g/dL (ref 3.8–4.9)
BUN/Creatinine Ratio: 13 (ref 9–20)
BUN: 36 mg/dL — ABNORMAL HIGH (ref 6–24)
CO2: 20 mmol/L (ref 20–29)
Calcium: 9.8 mg/dL (ref 8.7–10.2)
Chloride: 93 mmol/L — ABNORMAL LOW (ref 96–106)
Creatinine, Ser: 2.81 mg/dL — ABNORMAL HIGH (ref 0.76–1.27)
Glucose: 165 mg/dL — ABNORMAL HIGH (ref 70–99)
Phosphorus: 3.9 mg/dL (ref 2.8–4.1)
Potassium: 3.9 mmol/L (ref 3.5–5.2)
Sodium: 130 mmol/L — ABNORMAL LOW (ref 134–144)
eGFR: 26 mL/min/{1.73_m2} — ABNORMAL LOW (ref >59)

## 2022-02-13 LAB — PSA TOTAL (REFLEX TO FREE): Prostate Specific Ag, Serum: 0.2 ng/mL (ref 0.0–4.0)

## 2022-02-13 LAB — RHEUMATOID FACTOR: Rheumatoid fact SerPl-aCnc: <10 IU/mL (ref <14.0)

## 2022-02-13 LAB — ANA W/REFLEX IF POSITIVE: Anti Nuclear Antibody (ANA): NEGATIVE

## 2022-02-13 LAB — SEDIMENTATION RATE: Sed Rate: 18 mm/hr (ref 0–30)

## 2022-02-16 ENCOUNTER — Ambulatory Visit: Payer: Medicare HMO

## 2022-02-19 ENCOUNTER — Telehealth: Payer: Self-pay | Admitting: Family Medicine

## 2022-02-19 DIAGNOSIS — M79604 Pain in right leg: Secondary | ICD-10-CM

## 2022-02-19 DIAGNOSIS — L905 Scar conditions and fibrosis of skin: Secondary | ICD-10-CM

## 2022-02-20 NOTE — Telephone Encounter (Signed)
Medication Refill - Medication: oxyCODONE (OXY IR/ROXICODONE) 5 MG immediate release tablet   Has the patient contacted their pharmacy? No. (Agent: If no, request that the patient contact the pharmacy for the refill. If patient does not wish to contact the pharmacy document the reason why and proceed with request.)   Preferred Pharmacy (with phone number or street name):  [place], [place] - [address]  [address] [place] [address]  Phone: [phone] Fax: [phone]  Hours: Not open 24 hours   Has the patient been seen for an appointment in the last year OR does the patient have an upcoming appointment? Yes.    Agent: Please be advised that RX refills may take up to 3 business days. We ask that you follow-up with your pharmacy.

## 2022-02-21 MED ORDER — OXYCODONE HCL 5 MG PO TABS: 5.0000 mg | ORAL_TABLET | Freq: Four times a day (QID) | ORAL | 0 refills | Status: DC | PRN

## 2022-02-22 DIAGNOSIS — N183 Chronic kidney disease, stage 3 unspecified: Secondary | ICD-10-CM

## 2022-02-22 DIAGNOSIS — I129 Hypertensive chronic kidney disease with stage 1 through stage 4 chronic kidney disease, or unspecified chronic kidney disease: Secondary | ICD-10-CM

## 2022-02-22 DIAGNOSIS — F17210 Nicotine dependence, cigarettes, uncomplicated: Secondary | ICD-10-CM

## 2022-02-22 DIAGNOSIS — Z794 Long term (current) use of insulin: Secondary | ICD-10-CM | POA: Diagnosis not present

## 2022-02-22 DIAGNOSIS — E1122 Type 2 diabetes mellitus with diabetic chronic kidney disease: Secondary | ICD-10-CM | POA: Diagnosis not present

## 2022-02-22 NOTE — Patient Instructions (Signed)
Visit Information It was great speaking with you today!  Please let me know if you have any questions about our visit.  Patient Care Plan: General Pharmacy (Adult)     Problem Identified: Hypertension, Hyperlipidemia, Diabetes, Coronary Artery Disease, GERD, Anxiety, Tobacco use and History of Renal Transplant   Priority: High     Long-Range Goal: Patient-Specific Goal   Start Date: 05/19/2020  Expected End Date: 12/05/2022  This Visit's Progress: On track  Recent Progress: On track  Priority: High  Note:   Current Barriers:  Unable to independently afford treatment regimen Unable to achieve control of Diabetes   Pharmacist Clinical Goal(s):  Over the next 90 days, patient will verbalize ability to afford treatment regimen achieve control of Diabetes as evidenced by A1c less than 7% through collaboration with PharmD and provider.   Interventions: 1:1 collaboration with Birdie Sons, MD regarding development and update of comprehensive plan of care as evidenced by provider attestation and co-signature Inter-disciplinary care team collaboration (see longitudinal plan of care) Comprehensive medication review performed; medication list updated in electronic medical record  Hypertension (BP goal <130/80) -Uncontrolled -Current treatment: Amlodipine 10 mg daily Carvedilol 12.5 mg twice daily   Losartan 100 mg daily   -Medications previously tried: NA  -Current home readings:  -Denies hypotensive/hypertensive symptoms -Recommended to continue current medication  Hyperlipidemia: (LDL goal < 70) -History of PAD, CAD  -Controlled -Current treatment: Rosuvastatin 40 mg daily  -Medications previously tried: NA  -Educated on Importance of limiting foods high in cholesterol; -Recommended to continue current medication  Diabetes (A1c goal <7%) -Controlled -Managed by Dr. Honor Junes -Current medications: [name] 10 mg daily: Appropriate, Query effective Novolog 15 units three  times daily + 2 units for every 50 units above 150: Appropriate, Query effective  Ozempic 0.5 mg on Tuesdays: Appropriate, Query effective Tresiba 50 units daily (0.44 u/kg): Appropriate, Query effective -Medications previously tried: Lantus (Formulary)  -Current home glucose readings  Target 6/13-6/26 7/19-8/1 8/29-9/11 9/12-9/18  Number of days worn ? 14 days '14 14 14 14  '$ % of time active ? 70% 86% 75% 88% 85%  Mean Glucose (mg/dL)  167 204 129 171  GMI  7.3% 8.2% 6.4% 7.4%  Glycemic Variability (%CV) ?36% 42.5% 49.5% 42.9% 31.4%  Time above >250 mg/dL <5% 12% 29% 5% 8%  Time above 70-180 mg/dL <25% 25% 16% 13% 35%  Time in range: 70-180 mg/dL >70% 62% 54% 75% 55%  Time below 70 mg/dL <4% 1% 1% 7% 2%  Time below 54 mg/dL <1%   0%   -Dietary Patterns: Dramatic changes to diet, cut out sodas, cut out fried foods, and has cut out late night snacking.  -Blood sugars have resumed normal patterns, patient believes his hypoglycemia was due to a faulty sensor. He has resumed previous doses of Tresiba + Novolog and is planning to start Ozempic 1 mg as prescribed by Dr. Honor Junes  -Recommend decreasing Tyler Aas to 40 units daily to minimize risk of hypoglycemia when starting Ozempic 1 mg weekly.   Anxiety (Goal: Maintain stable mood and sleep) -Controlled -Current treatment: Alprazolam 2 mg three times daily as needed - Sleep  -Medications previously tried/failed: NA -PHQ9: 0 -GAD7: 5 -Still struggling with grief from his mother's death. Some days he feels ok but other times   Renal Transplant  (Goal: prevent rejection of kidney ) -Managed by Dr. Holley Raring  -Controlled -Current treatment  Mycophenolate 500 mg 2 tablets twice daily  Tacrolimus 1 mg 3 capsules twice daily  -  Medications previously tried: Myfortic (cost) -Recommended to continue current medication  Tobacco use (Goal Quit smoking) -Uncontrolled -Previous quit attempts: Chantix (nightmares), nicotine gum (ulcers), nicotine  patch (stickiness) -Current treatment  17-18 cigarettes daily  -Patient smokes Within 30 minutes of waking -Patient triggers include: stress and finishing a meal -Continue working to cut down on cigarette use.   Chronic Kidney Disease Stage 3a  -All medications assessed for renal dosing and appropriateness in chronic kidney disease. -Recommended to continue current medication  Patient Goals/Self-Care Activities Over the next 90 days, patient will:  - check glucose 2-3 times daily , document, and provide at future appointments -check blood pressure 2-3 times weekly , document, and provide at future appointments -decrease cigarette use   Follow Up Plan: Telephone follow up appointment with care management team member scheduled for:  03/02/2022 at 1:00 PM    Patient agreed to services and verbal consent obtained.   Patient verbalizes understanding of instructions and care plan provided today and agrees to view in [place]. Active MyChart status and patient understanding of how to access instructions and care plan via MyChart confirmed with patient.     [name], PharmD, [place], [name]  Clinical Pharmacist Practitioner  [facility] [phone]

## 2022-02-26 ENCOUNTER — Other Ambulatory Visit: Payer: Self-pay | Admitting: Family Medicine

## 2022-02-26 DIAGNOSIS — N2581 Secondary hyperparathyroidism of renal origin: Secondary | ICD-10-CM | POA: Diagnosis not present

## 2022-02-26 DIAGNOSIS — N186 End stage renal disease: Secondary | ICD-10-CM | POA: Diagnosis not present

## 2022-02-26 DIAGNOSIS — I10 Essential (primary) hypertension: Secondary | ICD-10-CM | POA: Diagnosis not present

## 2022-02-26 DIAGNOSIS — Z940 Kidney transplant status: Secondary | ICD-10-CM | POA: Diagnosis not present

## 2022-02-26 DIAGNOSIS — R809 Proteinuria, unspecified: Secondary | ICD-10-CM | POA: Diagnosis not present

## 2022-02-27 ENCOUNTER — Telehealth: Payer: Self-pay

## 2022-02-27 NOTE — Progress Notes (Signed)
  Chronic Care Management   Note  02/27/2022 Name: DILLIAN FEIG MRN: [id number] DOB: 1965-05-22  VONNIE LIGMAN is a 56 y.o. year old male who is a primary care patient of Caryn Section, Kirstie Peri, MD. I reached out to [name] by phone today in response to a referral sent by Mr. Emeka Lindner Koppel's PCP.  Mr. JOVEN MOM  agreedto scheduling an appointment with the CCM RN Case Manager   Follow up plan: Patient agreed to scheduled appointment with RN Case Manager on 03/01/2022(date/time).   Noreene Larsson, [place], [place] [address] Direct Dial: [phone] [email or web address]

## 2022-02-27 NOTE — Telephone Encounter (Signed)
Requested medication (s) are due for refill today - yes  Requested medication (s) are on the active medication list -yes  Future visit scheduled -no  Last refill: 01/29/22 #90   Notes to clinic: non delegated Rx  Requested Prescriptions  Pending Prescriptions Disp Refills   alprazolam (XANAX) 2 MG tablet [Pharmacy Med Name: ALPRAZolam 2 MG Oral Tablet] 90 tablet 0    Sig: TAKE 1 TABLET BY MOUTH THREE TIMES DAILY AS NEEDED FOR SLEEP     Not Delegated - Psychiatry: Anxiolytics/Hypnotics 2 Failed - 02/26/2022  4:25 PM      Failed - This refill cannot be delegated      Failed - Urine Drug Screen completed in last 360 days      Passed - Patient is not pregnant      Passed - Valid encounter within last 6 months    Recent Outpatient Visits           2 weeks ago Urinary hesitancy   [facility] Birdie Sons, MD   8 months ago Primary hypertension   [facility] Birdie Sons, MD   1 year ago Type 2 diabetes mellitus with diabetic nephropathy, without long-term current use of insulin ([place])   [facility] Birdie Sons, MD   1 year ago Type 2 diabetes mellitus with diabetic nephropathy, without long-term current use of insulin ([place])   [facility] Birdie Sons, MD   2 years ago Type 2 diabetes mellitus with diabetic nephropathy, with long-term current use of insulin ([place])   [place], Kirstie Peri, MD       Future Appointments             In 2 weeks Vanga, Tally Due, MD [place]               Requested Prescriptions  Pending Prescriptions Disp Refills   alprazolam Duanne Moron) 2 MG tablet [Pharmacy Med Name: ALPRAZolam 2 MG Oral Tablet] 90 tablet 0    Sig: TAKE 1 TABLET BY MOUTH THREE TIMES DAILY AS NEEDED FOR SLEEP     Not Delegated - Psychiatry: Anxiolytics/Hypnotics 2 Failed - 02/26/2022  4:25 PM      Failed - This refill cannot be delegated      Failed - Urine  Drug Screen completed in last 360 days      Passed - Patient is not pregnant      Passed - Valid encounter within last 6 months    Recent Outpatient Visits           2 weeks ago Urinary hesitancy   [facility] Birdie Sons, MD   8 months ago Primary hypertension   [facility] Birdie Sons, MD   1 year ago Type 2 diabetes mellitus with diabetic nephropathy, without long-term current use of insulin ([place])   [facility] Birdie Sons, MD   1 year ago Type 2 diabetes mellitus with diabetic nephropathy, without long-term current use of insulin ([place])   [facility] Birdie Sons, MD   2 years ago Type 2 diabetes mellitus with diabetic nephropathy, with long-term current use of insulin [facility])   [place], Kirstie Peri, MD       Future Appointments             In 2 weeks Vanga, Tally Due, MD [place]

## 2022-02-28 ENCOUNTER — Telehealth: Payer: Self-pay

## 2022-02-28 ENCOUNTER — Other Ambulatory Visit: Payer: Self-pay | Admitting: Family Medicine

## 2022-02-28 ENCOUNTER — Ambulatory Visit (INDEPENDENT_AMBULATORY_CARE_PROVIDER_SITE_OTHER): Payer: Medicare HMO

## 2022-02-28 VITALS — Ht 71.0 in | Wt 216.0 lb

## 2022-02-28 DIAGNOSIS — Z0000 Encounter for general adult medical examination without abnormal findings: Secondary | ICD-10-CM

## 2022-02-28 NOTE — Progress Notes (Signed)
Virtual Visit via Telephone Note  I connected with  James Moreno on 02/28/22 at 10:45 AM EST by telephone and verified that I am speaking with the correct person using two identifiers.  Location: Patient: home Provider: [facility] Persons participating in the virtual visit: [place]   I discussed the limitations, risks, security and privacy concerns of performing an evaluation and management service by telephone and the availability of in person appointments. The patient expressed understanding and agreed to proceed.  Interactive audio and video telecommunications were attempted between this nurse and patient, however failed, due to patient having technical difficulties OR patient did not have access to video capability.  We continued and completed visit with audio only.  Some vital signs may be absent or patient reported.   Dionisio David, LPN  Subjective:   James Moreno is a 56 y.o. male who presents for Medicare Annual/Subsequent preventive examination.  Review of Systems     Cardiac Risk Factors include: advanced age (>66mn, >>7women);male gender;diabetes mellitus     Objective:    Today's Vitals   02/28/22 1045  PainSc: 9    There is no height or weight on file to calculate BMI.     02/28/2022   10:59 AM 02/27/2021   11:01 AM 01/19/2020   11:27 AM 01/21/2019    7:15 AM 01/06/2019    9:39 AM 01/02/2018    9:29 AM 10/21/2015    1:08 PM  Advanced Directives  Does Patient Have a Medical Advance Directive? No No No No No Yes Yes  Type of ATeacher, early years/preLiving will   Does patient want to make changes to medical advance directive?     Yes (ED - Information included in AVS)    Copy of [place] Chart?      No - copy requested   Would patient like information on creating a medical advance directive? No - Patient declined Yes (ED - Information included in AVS) No - Patient declined Yes  (MAU/Ambulatory/Procedural Areas - Information given)       Current Medications (verified) Outpatient Encounter Medications as of 02/28/2022  Medication Sig   alprazolam (XANAX) 2 MG tablet Take 1 tablet (2 mg total) by mouth 3 (three) times daily as needed for sleep.   amLODipine (NORVASC) 10 MG tablet Take 10 mg by mouth daily.    BD INSULIN SYRINGE U/F 31G X 5/16" 1 ML MISC    calcitRIOL (ROCALTROL) 0.25 MCG capsule Take 1 capsule (0.25 mcg total) by mouth daily.   carvedilol (COREG) 12.5 MG tablet Take 1.5 tablets (18.75 mg total) by mouth 2 (two) times daily.   cinacalcet (SENSIPAR) 30 MG tablet Take 1 tablet (30 mg total) by mouth daily.   dapagliflozin propanediol ([name]) 10 MG TABS tablet Take 1 tablet (10 mg total) by mouth daily before breakfast. Patient receives through [name] through Dec 2023   EDEX 40 MCG injection 40 mcg by Intracavitary route as needed.    insulin aspart (NOVOLOG) 100 UNIT/ML injection Inject 15 Units into the skin 3 (three) times daily before meals.   insulin degludec (TRESIBA FLEXTOUCH) 100 UNIT/ML FlexTouch Pen Inject 45 Units into the skin daily.   losartan (COZAAR) 100 MG tablet Take 1 tablet (100 mg total) by mouth at bedtime.   mycophenolate (CELLCEPT) 500 MG tablet Take 1,000 mg by mouth 2 (two) times daily.   omeprazole (PRILOSEC) 20 MG capsule Take by  mouth.   oxyCODONE (OXY IR/ROXICODONE) 5 MG immediate release tablet Take 1-2 tablets (5-10 mg total) by mouth every 6 (six) hours as needed for severe pain.   rosuvastatin (CRESTOR) 40 MG tablet TAKE 1 TABLET EVERY DAY   sildenafil (REVATIO) 20 MG tablet    tacrolimus (PROGRAF) 1 MG capsule Take 3 mg by mouth 2 (two) times daily.    tamsulosin (FLOMAX) 0.4 MG CAPS capsule Take 1 capsule (0.4 mg total) by mouth daily.   tirzepatide [facility]) 2.5 MG/0.5ML Pen Inject 2.5 mg into the skin once a week.   tretinoin (RETIN-A) 0.05 % cream Apply topically as needed.    triamcinolone cream (KENALOG) 0.1 %  APPLY DAILY TO INFLAMED BUMPS AS NEEDED   XYOSTED 50 MG/0.5ML SOAJ    fenofibrate (TRICOR) 145 MG tablet  (Patient not taking: Reported on 02/28/2022)   naloxone (NARCAN) nasal spray 4 mg/0.1 mL Place 1 spray into the nose once.  (Patient not taking: Reported on 02/28/2022)   [DISCONTINUED] Semaglutide,0.25 or 0.'5MG'$ /DOS, (OZEMPIC, 0.25 OR 0.5 MG/DOSE,) 2 MG/1.5ML SOPN Inject 0.25 mg into the skin for four weeks, then inject 0.5 mg into the skin weekly. (Patient not taking: Reported on 02/28/2022)   No facility-administered encounter medications on file as of 02/28/2022.    Allergies (verified) No known allergies   History: Past Medical History:  Diagnosis Date   Acute kidney failure, unspecified ([place])    Anxiety    Diabetes mellitus, type 2 (HCC)    GERD (gastroesophageal reflux disease)    History of arterial disease of lower extremity    History of hepatitis B    Hypertension    Hypothyroidism    Mitral valve disorders(424.0)    Pityriasis 12/27/2014   Primary pulmonary HTN ([place])    Pt denies ever having.   Tricuspid valve disorders, specified as nonrheumatic    Past Surgical History:  Procedure Laterality Date   APPENDECTOMY     Carotid Doppler Ultrasound  06/14/2009   39% stenosis of bilateral internal carotid artery, bilateral anterograde vertebral flow   ESOPHAGOGASTRODUODENOSCOPY (EGD) WITH PROPOFOL N/A 01/21/2019   Procedure: ESOPHAGOGASTRODUODENOSCOPY (EGD) WITH PROPOFOL;  Surgeon: Lin Landsman, MD;  Location: [facility];  Service: Endoscopy;  Laterality: N/A;  Diabetic - insulin   EYE SURGERY     right   HEMORROIDECTOMY     KIDNEY TRANSPLANT Right 2016   MECKEL DIVERTICULUM EXCISION     infancy   Myocardial Perfusion scan  01/17/2009   [facility], non- ischemic. LVEF= 55%   PARS PLANA VITRECTOMY Left 02/09/2015   Procedure: Pan retinal photocoagulation 76195;  Surgeon: Milus Height, MD;  Location: [facility];  Service: Ophthalmology;   Laterality: Left;   REFRACTIVE SURGERY Left    sleep study  01/09/2011   Severe sleep apnea. AHI 72.9/hr. RDI=83.0/hr. Desaturation to 69.0% Emergency CPAP titaration to 14.0cm (01/14/19 resolved after wt loss after kidney transplant.)   Family History  Problem Relation Age of Onset   Hypertension Mother    Hyperlipidemia Mother    Melanoma Father    Social History   Socioeconomic History   Marital status: Divorced    Spouse name: Not on file   Number of children: 1   Years of education: Not on file   Highest education level: Associate degree: occupational, Hotel manager, or vocational program  Occupational History   Occupation: Insurance account manager    Comment: on disability  Tobacco Use   Smoking status: Every Day    Packs/day: 1.00  Years: 41.00    Total pack years: 41.00    Types: Cigarettes   Smokeless tobacco: Never   Tobacco comments:    since age 60.  Vaping Use   Vaping Use: Former  Substance and Sexual Activity   Alcohol use: Yes    Alcohol/week: 0.0 - 1.0 standard drinks of alcohol    Comment: Excessive alcohol consumption in the past. Quit around 2016. Drinks occasionally.   Drug use: No   Sexual activity: Not on file  Other Topics Concern   Not on file  Social History Narrative   Not on file   Social Determinants of Health   Financial Resource Strain: Medium Risk (02/28/2022)   Overall Financial Resource Strain (CARDIA)    Difficulty of Paying Living Expenses: Somewhat hard  Food Insecurity: No Food Insecurity (02/28/2022)   Hunger Vital Sign    Worried About Running Out of Food in the Last Year: Never true    Ran Out of Food in the Last Year: Never true  Transportation Needs: No Transportation Needs (02/28/2022)   PRAPARE - Hydrologist (Medical): No    Lack of Transportation (Non-Medical): No  Physical Activity: Inactive (02/28/2022)   Exercise Vital Sign    Days of Exercise per Week: 0 days    Minutes of Exercise per Session: 0  min  Stress: Stress Concern Present (02/28/2022)   [place]    Feeling of Stress : To some extent  Social Connections: Moderately Isolated (02/28/2022)   Social Connection and Isolation Panel [NHANES]    Frequency of Communication with Friends and Family: More than three times a week    Frequency of Social Gatherings with Friends and Family: Once a week    Attends Religious Services: More than 4 times per year    Active Member of [employer] or Organizations: No    Attends Archivist Meetings: Never    Marital Status: Divorced    Tobacco Counseling Ready to quit: Not Answered Counseling given: Not Answered Tobacco comments: since age 41.   Clinical Intake:  Pre-visit preparation completed: Yes  Pain : 0-10 Pain Score: 9  Pain Type: Chronic pain Pain Location: Epigastric Pain Relieving Factors: laying down  Pain Relieving Factors: laying down  Nutritional Risks: None Diabetes: Yes CBG done?: No Did pt. bring in CBG monitor from home?: No  How often do you need to have someone help you when you read instructions, pamphlets, or other written materials from your doctor or pharmacy?: 1 - Never  Diabetic?yes Nutrition Risk Assessment:  Has the patient had any N/V/D within the last 2 months?  Yes  Does the patient have any non-healing wounds?  No  Has the patient had any unintentional weight loss or weight gain?  No   Diabetes:  Is the patient diabetic?  Yes  If diabetic, was a CBG obtained today?  No  Did the patient bring in their glucometer from home?  No  How often do you monitor your CBG's? continuous.   Financial Strains and Diabetes Management:  Are you having any financial strains with the device, your supplies or your medication? No .  Does the patient want to be seen by Chronic Care Management for management of their diabetes?  No  Would the patient like to be referred to a Nutritionist  or for Diabetic Management?  No   Diabetic Exams:  Diabetic Eye Exam: Completed 06/14/21. Pt has been advised  about the importance in completing this exam.   Diabetic Foot Exam: Completed 11/14/17. Pt has been advised about the importance in completing this exam.   Interpreter Needed?: No  Information entered by :: Kirke Shaggy, LPN   Activities of Daily Living    02/28/2022   11:00 AM 02/12/2022    1:35 PM  In your present state of health, do you have any difficulty performing the following activities:  Hearing? 0 0  Vision? 1 1  Difficulty concentrating or making decisions? 0 0  Walking or climbing stairs? 1 1  Dressing or bathing? 0 0  Doing errands, shopping? 0 0  Preparing Food and eating ? N   Using the Toilet? N   In the past six months, have you accidently leaked urine? N   Do you have problems with loss of bowel control? N   Managing your Medications? N   Managing your Finances? N   Housekeeping or managing your Housekeeping? N     Patient Care Team: Birdie Sons, MD as PCP - General (Family Medicine) Rockey Situ Kathlene November, MD as PCP - Cardiology (Cardiology) [place], [place] (Optometry) Anthonette Legato, MD (Nephrology) Long, Thomes Cake, MD as Referring Physician (Vascular Surgery) Isaias Sakai, MD as Referring Physician (Ophthalmology) Lin Landsman, MD as Consulting Physician (Gastroenterology) Wellington Hampshire, MD as Consulting Physician (Cardiology) Germaine Pomfret, [facility] (Pharmacist) Garrel Ridgel, DPM as Consulting Physician (Podiatry)  Indicate any recent Medical Services you may have received from other than [facility] providers in the past year (date may be approximate).     Assessment:   This is a routine wellness examination for [place].  Hearing/Vision screen Hearing Screening - Comments:: No aids Vision Screening - Comments:: [facility]- [facility]  Dietary issues and exercise activities discussed: Current Exercise Habits: The  patient does not participate in regular exercise at present   Goals Addressed             This Visit's Progress    DIET - EAT MORE FRUITS AND VEGETABLES         Depression Screen    02/28/2022   10:56 AM 02/12/2022    1:35 PM 06/13/2021   10:27 AM 02/27/2021   10:59 AM 10/14/2020    3:41 PM 01/19/2020   11:22 AM 01/06/2019    9:41 AM  PHQ 2/9 Scores  PHQ - 2 Score 0 0 0 0 0 0 0  PHQ- 9 Score 1 1 0  0      Fall Risk    02/28/2022   11:00 AM 02/12/2022    1:36 PM 06/13/2021   10:27 AM 02/27/2021   11:02 AM 10/14/2020    3:40 PM  Fall Risk   Falls in the past year? 1 0 0 0 0  Number falls in past yr: 0 0 0 0 0  Injury with Fall? 0 0 0 0 0  Risk for fall due to : History of fall(s) No Fall Risks No Fall Risks No Fall Risks No Fall Risks  Follow up Falls prevention discussed;Falls evaluation completed Falls evaluation completed Falls evaluation completed Falls prevention discussed     FALL RISK PREVENTION PERTAINING TO THE HOME:  Any stairs in or around the home? No  If so, are there any without handrails? No  Home free of loose throw rugs in walkways, pet beds, electrical cords, etc? Yes  Adequate lighting in your home to reduce risk of falls? Yes   ASSISTIVE DEVICES UTILIZED TO  PREVENT FALLS:  Life alert? No  Use of a cane, walker or w/c? No  Grab bars in the bathroom? No  Shower chair or bench in shower? Yes  Elevated toilet seat or a handicapped toilet? Yes   Cognitive Function:declined testing 2023        01/06/2019    9:46 AM  6CIT Screen  What Year? 0 points  What month? 0 points  What time? 0 points  Count back from 20 0 points  Months in reverse 0 points  Repeat phrase 0 points  Total Score 0 points    Immunizations Immunization History  Administered Date(s) Administered   Influenza Split 12/05/2010   Influenza,inj,Quad PF,6+ Mos 12/31/2016, 01/27/2019, 01/02/2021   Influenza-Unspecified 12/26/2020   PFIZER(Purple Top)SARS-COV-2 Vaccination  06/18/2019, 07/09/2019, 02/22/2020, 01/31/2021   Pneumococcal Polysaccharide-23 10/24/2010   Tdap 12/05/2010    TDAP status: Due, Education has been provided regarding the importance of this vaccine. Advised may receive this vaccine at local pharmacy or Health Dept. Aware to provide a copy of the vaccination record if obtained from local pharmacy or Health Dept. Verbalized acceptance and understanding.  Flu Vaccine status: Due, Education has been provided regarding the importance of this vaccine. Advised may receive this vaccine at local pharmacy or Health Dept. Aware to provide a copy of the vaccination record if obtained from local pharmacy or Health Dept. Verbalized acceptance and understanding.  Pneumococcal vaccine status: Up to date  Covid-19 vaccine status: Completed vaccines  Qualifies for Shingles Vaccine? Yes   Zostavax completed No   Shingrix Completed?: No.    Education has been provided regarding the importance of this vaccine. Patient has been advised to call insurance company to determine out of pocket expense if they have not yet received this vaccine. Advised may also receive vaccine at local pharmacy or Health Dept. Verbalized acceptance and understanding.  Screening Tests Health Maintenance  Topic Date Due   HIV Screening  Never done   Zoster Vaccines- Shingrix (1 of 2) Never done   DTaP/Tdap/Td (2 - Td or Tdap) 12/04/2020   COVID-19 Vaccine (5 - 2023-24 season) 11/24/2021   INFLUENZA VACCINE  06/24/2022 (Originally 10/24/2021)   HEMOGLOBIN A1C  05/29/2022   OPHTHALMOLOGY EXAM  06/15/2022   Lung Cancer Screening  08/24/2022   Medicare Annual Wellness (AWV)  03/01/2023   Fecal DNA (Cologuard)  12/27/2023   HPV VACCINES  Aged Out    Health Maintenance  Health Maintenance Due  Topic Date Due   HIV Screening  Never done   Zoster Vaccines- Shingrix (1 of 2) Never done   DTaP/Tdap/Td (2 - Td or Tdap) 12/04/2020   COVID-19 Vaccine (5 - 2023-24 season) 11/24/2021     Colorectal cancer screening: Type of screening: Cologuard. Completed 12/30/20. Repeat every 3 years  Lung Cancer Screening: (Low Dose CT Chest recommended if Age 2-80 years, 30 pack-year currently smoking OR have quit w/in 15years.) does qualify.   Lung Cancer Screening Referral: ordered 08/31/21- done  Additional Screening:  Hepatitis C Screening: does qualify; Completed no  Vision Screening: Recommended annual ophthalmology exams for early detection of glaucoma and other disorders of the eye. Is the patient up to date with their annual eye exam?  Yes  Who is the provider or what is the name of the office in which the patient attends annual eye exams? [facility] If pt is not established with a provider, would they like to be referred to a provider to establish care? No .   Dental  Screening: Recommended annual dental exams for proper oral hygiene  Community Resource Referral / Chronic Care Management: CRR required this visit?  No   CCM required this visit?  No      Plan:     I have personally reviewed and noted the following in the patient's chart:   Medical and social history Use of alcohol, tobacco or illicit drugs  Current medications and supplements including opioid prescriptions. Patient is currently taking opioid prescriptions. Information provided to patient regarding non-opioid alternatives. Patient advised to discuss non-opioid treatment plan with their provider. Functional ability and status Nutritional status Physical activity Advanced directives List of other physicians Hospitalizations, surgeries, and ER visits in previous 12 months Vitals Screenings to include cognitive, depression, and falls Referrals and appointments  In addition, I have reviewed and discussed with patient certain preventive protocols, quality metrics, and best practice recommendations. A written personalized care plan for preventive services as well as general preventive health  recommendations were provided to patient.     Dionisio David, LPN   [id number]   Nurse Notes: none

## 2022-02-28 NOTE — Patient Instructions (Signed)
James Moreno , Thank you for taking time to come for your Medicare Wellness Visit. I appreciate your ongoing commitment to your health goals. Please review the following plan we discussed and let me know if I can assist you in the future.   Screening recommendations/referrals: Colonoscopy: Cologuard 12/30/20 Recommended yearly ophthalmology/optometry visit for glaucoma screening and checkup Recommended yearly dental visit for hygiene and checkup  Vaccinations: Influenza vaccine: 01/02/21 Pneumococcal vaccine: 10/24/10 Tdap vaccine: 12/05/10, due if have injury Shingles vaccine: n/d   Covid-19: 06/18/19, 07/09/19, 02/22/20,01/31/21  Advanced directives: no  Conditions/risks identified: none  Next appointment: Follow up in one year for your annual wellness visit 03/04/23 @ 2:15 pm by phone  Preventive Care 40-64 Years, Male Preventive care refers to lifestyle choices and visits with your health care provider that can promote health and wellness. What does preventive care include? A yearly physical exam. This is also called an annual well check. Dental exams once or twice a year. Routine eye exams. Ask your health care provider how often you should have your eyes checked. Personal lifestyle choices, including: Daily care of your teeth and gums. Regular physical activity. Eating a healthy diet. Avoiding tobacco and drug use. Limiting alcohol use. Practicing safe sex. Taking low-dose aspirin every day starting at age 1. What happens during an annual well check? The services and screenings done by your health care provider during your annual well check will depend on your age, overall health, lifestyle risk factors, and family history of disease. Counseling  Your health care provider may ask you questions about your: Alcohol use. Tobacco use. Drug use. Emotional well-being. Home and relationship well-being. Sexual activity. Eating habits. Work and work Statistician. Screening  You  may have the following tests or measurements: Height, weight, and BMI. Blood pressure. Lipid and cholesterol levels. These may be checked every 5 years, or more frequently if you are over 47 years old. Skin check. Lung cancer screening. You may have this screening every year starting at age 19 if you have a 30-pack-year history of smoking and currently smoke or have quit within the past 15 years. Fecal occult blood test (FOBT) of the stool. You may have this test every year starting at age 65. Flexible sigmoidoscopy or colonoscopy. You may have a sigmoidoscopy every 5 years or a colonoscopy every 10 years starting at age 81. Prostate cancer screening. Recommendations will vary depending on your family history and other risks. Hepatitis C blood test. Hepatitis B blood test. Sexually transmitted disease (STD) testing. Diabetes screening. This is done by checking your blood sugar (glucose) after you have not eaten for a while (fasting). You may have this done every 1-3 years. Discuss your test results, treatment options, and if necessary, the need for more tests with your health care provider. Vaccines  Your health care provider may recommend certain vaccines, such as: Influenza vaccine. This is recommended every year. Tetanus, diphtheria, and acellular pertussis (Tdap, Td) vaccine. You may need a Td booster every 10 years. Zoster vaccine. You may need this after age 31. Pneumococcal 13-valent conjugate (PCV13) vaccine. You may need this if you have certain conditions and have not been vaccinated. Pneumococcal polysaccharide (PPSV23) vaccine. You may need one or two doses if you smoke cigarettes or if you have certain conditions. Talk to your health care provider about which screenings and vaccines you need and how often you need them. This information is not intended to replace advice given to you by your health care provider. Make sure  you discuss any questions you have with your health care  provider. Document Released: 04/08/2015 Document Revised: 11/30/2015 Document Reviewed: 01/11/2015 Elsevier Interactive Patient Education  2017 [place] Prevention in the Home Falls can cause injuries. They can happen to people of all ages. There are many things you can do to make your home safe and to help prevent falls. What can I do on the outside of my home? Regularly fix the edges of walkways and driveways and fix any cracks. Remove anything that might make you trip as you walk through a door, such as a raised step or threshold. Trim any bushes or trees on the path to your home. Use bright outdoor lighting. Clear any walking paths of anything that might make someone trip, such as rocks or tools. Regularly check to see if handrails are loose or broken. Make sure that both sides of any steps have handrails. Any raised decks and porches should have guardrails on the edges. Have any leaves, snow, or ice cleared regularly. Use sand or salt on walking paths during winter. Clean up any spills in your garage right away. This includes oil or grease spills. What can I do in the bathroom? Use night lights. Install grab bars by the toilet and in the tub and shower. Do not use towel bars as grab bars. Use non-skid mats or decals in the tub or shower. If you need to sit down in the shower, use a plastic, non-slip stool. Keep the floor dry. Clean up any water that spills on the floor as soon as it happens. Remove soap buildup in the tub or shower regularly. Attach bath mats securely with double-sided non-slip rug tape. Do not have throw rugs and other things on the floor that can make you trip. What can I do in the bedroom? Use night lights. Make sure that you have a light by your bed that is easy to reach. Do not use any sheets or blankets that are too big for your bed. They should not hang down onto the floor. Have a firm chair that has side arms. You can use this for support while  you get dressed. Do not have throw rugs and other things on the floor that can make you trip. What can I do in the kitchen? Clean up any spills right away. Avoid walking on wet floors. Keep items that you use a lot in easy-to-reach places. If you need to reach something above you, use a strong step stool that has a grab bar. Keep electrical cords out of the way. Do not use floor polish or wax that makes floors slippery. If you must use wax, use non-skid floor wax. Do not have throw rugs and other things on the floor that can make you trip. What can I do with my stairs? Do not leave any items on the stairs. Make sure that there are handrails on both sides of the stairs and use them. Fix handrails that are broken or loose. Make sure that handrails are as long as the stairways. Check any carpeting to make sure that it is firmly attached to the stairs. Fix any carpet that is loose or worn. Avoid having throw rugs at the top or bottom of the stairs. If you do have throw rugs, attach them to the floor with carpet tape. Make sure that you have a light switch at the top of the stairs and the bottom of the stairs. If you do not have them, ask someone  to add them for you. What else can I do to help prevent falls? Wear shoes that: Do not have high heels. Have rubber bottoms. Are comfortable and fit you well. Are closed at the toe. Do not wear sandals. If you use a stepladder: Make sure that it is fully opened. Do not climb a closed stepladder. Make sure that both sides of the stepladder are locked into place. Ask someone to hold it for you, if possible. Clearly mark and make sure that you can see: Any grab bars or handrails. First and last steps. Where the edge of each step is. Use tools that help you move around (mobility aids) if they are needed. These include: Canes. Walkers. Scooters. Crutches. Turn on the lights when you go into a dark area. Replace any light bulbs as soon as they burn  out. Set up your furniture so you have a clear path. Avoid moving your furniture around. If any of your floors are uneven, fix them. If there are any pets around you, be aware of where they are. Review your medicines with your doctor. Some medicines can make you feel dizzy. This can increase your chance of falling. Ask your doctor what other things that you can do to help prevent falls. This information is not intended to replace advice given to you by your health care provider. Make sure you discuss any questions you have with your health care provider. Document Released: 01/06/2009 Document Revised: 08/18/2015 Document Reviewed: 04/16/2014 Elsevier Interactive Patient Education  2017 [employer].

## 2022-02-28 NOTE — Telephone Encounter (Signed)
Copied from [place] [id number]. Topic: General - Other >> Feb 28, 2022  8:29 AM Chapman Fitch wrote: Reason for CRM: [employer] called to see if a clinical recommendations fax was received yesterday / please advise

## 2022-03-01 ENCOUNTER — Ambulatory Visit (INDEPENDENT_AMBULATORY_CARE_PROVIDER_SITE_OTHER): Payer: Medicare HMO

## 2022-03-01 DIAGNOSIS — Z794 Long term (current) use of insulin: Secondary | ICD-10-CM

## 2022-03-01 DIAGNOSIS — I10 Essential (primary) hypertension: Secondary | ICD-10-CM

## 2022-03-01 DIAGNOSIS — N1832 Chronic kidney disease, stage 3b: Secondary | ICD-10-CM

## 2022-03-01 NOTE — Patient Instructions (Addendum)
Thank you for allowing the Chronic Care Management team to participate in your care.    Following is a copy of your full provider care plan:   Goals Addressed             This Visit's Progress    Goal: CCM (Chronic Kidney Disease) Expected Outcome: Monitor, Self-Manage and Reduce Symptoms of Chronic Kidney Disease       Current Barriers:  Chronic Disease Management support and education needs related to Chronic Kidney Disease  Planned Interventions: Reviewed current treatment plan related to Chronic Kidney Disease. Patient currently being managed by [facility]/Dr. Holley Raring. Assessed the patient's understanding of CKD. Reviewed medications and discussed importance of compliance. Reports fianc is assisting with medication management. Per medication profile, cinacalcet/Sensipar '30mg'$  was ordered in January d/t chronic kidney disease and secondary hyperparathyroidism. Patient currently not on dialysis but reports treatment via peritoneal dialysis in the past. Patient reports not taking this medication d/t oversight. Unsure if recent lab elevations were d/t patient not taking the medication. Collaborated with PCP and contacted the Nephrology team. Pending return call from Nephrology/ Discussed the impact of chronic kidney disease on daily life and mental health. Acknowledged and normalized feelings of disempowerment and fear. Active Listening. Screening for signs and symptoms of depression related to chronic disease state.   Reviewed prescribed diet.  Reviewed scheduled/upcoming provider appointments. Patient scheduled for follow up with the Dr. Holley Raring on 06/04/22. Assessed social determinant of health barriers     Lab Results  Component Value Date   CREATININE 2.81 (H) 02/12/2022   CREATININE 1.72 (H) 06/20/2020   CREATININE 1.92 (H) 09/17/2019    BP Readings from Last 3 Encounters:  02/12/22 138/70  01/30/22 (!) 150/60  06/13/21 (!) 151/89    Lab Results  Component Value  Date   BUN 36 (H) 02/12/2022     Estimated Creatinine Clearance: 35 mL/min (A) (by C-G formula based on SCr of 2.81 mg/dL (H)).    Symptom Management: Call pharmacy for medication refills 3-7 days in advance of running out of medications Take medication exactly as prescribed Call provider office for new concerns or questions  Adjust medications as advised by the Nephrology team Complete clinic visit with Nephrology team as scheduled   Follow Up Plan:  Pending return call from the Nephrology team regarding medications. Will follow up with patient within the next week        Goal: CCM (Diabetes) Expected Outcome: Monitor, Self-Manage and Reduce Symptoms of Diabetes       Current Barriers:  Chronic Disease Management support and education needs related to Diabetes  Planned Interventions: Discussed current plan for diabetes management. Reviewed medications and discussed importance of medication adherence. Reports taking as prescribed. Reports starting Mounjaro injections yesterday. Agreed to keep provider and care management team updated of concerns r/t medication tolerance and prescription cost. Discussed glucose monitoring and recent readings. Currently using a [employer]. Unable to provide fasting readings. Reports 30-day average of 209 mg/dl. Reports 7-day average of 219 mg/dl.  Provided information regarding s/sx of hypoglycemia and hyperglycemia along with recommended interventions.  Discussed nutritional intake and importance of complying with a diabetic diet. Discussed importance of completing recommended DM preventive care. Reports completing foot care as advised. Currently being followed by Podiatry. Reports eye exam is up to date. Discussed importance of completing ordered labs as prescribed.  Assessed social determinant of health barriers.  Lab Results  Component Value Date   HGBA1C 6.9 11/28/2021    Symptom  Management: Take medications as prescribed   Attend all  scheduled provider appointments Call pharmacy for medication refills 3-7 days in advance of running out of medications Monitor blood glucose readings and respond to [facility] alarms appropriately Read food labels for fat, fiber, carbohydrates and portion size Check feet daily for cuts, sores or redness Wash and dry feet carefully every day Wear comfortable, cotton socks Wear comfortable, well-fitting shoes Call provider office for new concerns or questions   Follow Up Plan:  Will follow up next month       Goal: CCM (Hypertension) Expected Outcome: Monitor, Self-Manage and Reduce Symptoms of Hypertension       Current Barriers:  Chronic Disease Management support and education needs related to Hypertension  Planned Interventions: Reviewed provider's plan for hypertension management. Reviewed medications and indications for use. Reports fianc is assisting with medication management. Reports taking as prescribed and tolerating current regimen. Provided information regarding established blood pressure parameters along with indications for notifying a provider. Advised to monitor BP daily and record readings. Reports monitoring as advised. Reports systolic range in the 161W. Reports diastolic readings have ranged in the low 80s. Reviewed symptoms. Reports decreased energy and fatigue. Able to complete ADLs and self-care. Denies episodes of chest discomfort or palpitations. Denies episodes of headaches, dizziness, or visual changes. Reviewed symptoms that require immediate medical attention. Discussed compliance with recommended cardiac prudent diet. Encouraged to read nutrition labels, monitor sodium intake, and avoid highly processed foods when possible. Discussed complications of uncontrolled blood pressure.  Reviewed s/sx of heart attack, stroke and worsening symptoms that require immediate medical attention.   BP Readings from Last 3 Encounters:  02/12/22 138/70  01/30/22 (!)  150/60  06/13/21 (!) 151/89     Symptom Management: Take medications exactly as prescribed   Attend all scheduled provider appointments Call pharmacy for medication refills 3-7 days in advance of running out of medications Check blood pressure daily Keep a blood pressure log Eat more whole grains, fruits and vegetables, lean meats and healthy fats Limit salt intake  Call provider office for new concerns or questions    Follow Up Plan:  Will follow up next month            Mr. rochelle nephew understanding of instructions and care plan provided today. Agrees to view in MyChart.    A member of the care management team will follow up with Mr. Gillingham regarding medications within the next week   [place] Manager/Chronic Care Management [phone]

## 2022-03-01 NOTE — Plan of Care (Unsigned)
Chronic Care Management Provider Comprehensive Care Plan    03/01/2022 Name: James Moreno MRN: [id number] DOB: 19-Jan-1966  Referral to Chronic Care Management (CCM) services was placed by Provider: Birdie Sons, MD on Date: 02/04/22.  Chronic Condition 1: CKD Provider Assessment and Plan   Chronic kidney disease, stage 3b ([place]) Slightly worsening from his baseline. Possible related to urinary retention. Not scheduled to see nephrology until the end of December. Will see how creatinine is now and consider renal ultrasound if still doing poorly.  - Renal function panel   Expected Outcome/Goals Addressed This Visit (Provider CCM goals/Provider Assessment and plan  Goal: CCM (Chronic Kidney Disease) Expected Outcome: Monitor, Self-Manage and Reduce Symptoms of Chronic Kidney Disease  Symptom Management Condition 1: Call pharmacy for medication refills 3-7 days in advance of running out of medications Take medication exactly as prescribed Call provider office for new concerns or questions  Adjust medications as advised by the Nephrology team Complete clinic visit with Nephrology team as scheduled    Chronic Condition 2: DM Provider Assessment and Plan  Type 2 diabetes mellitus with diabetic nephropathy, without long-term current use of insulin ([place])   - Ambulatory referral to Podiatry    Expected Outcome/Goals Addressed This Visit (Provider CCM goals/Provider Assessment and plan  Goal: CCM (Diabetes) Expected Outcome: Monitor, Self-Manage and Reduce Symptoms of Diabetes  Symptom Management Condition 2: Take medications as prescribed   Attend all scheduled provider appointments Call pharmacy for medication refills 3-7 days in advance of running out of medications Monitor blood glucose readings and respond to [facility] alarms appropriately Read food labels for fat, fiber, carbohydrates and portion size Check feet daily for cuts, sores or redness Wash and dry feet  carefully every day Wear comfortable, cotton socks Wear comfortable, well-fitting shoes Call provider office for new concerns or questions    Chronic Condition 3: HTN Provider Assessment and Plan  Primary hypertension Not quite to goal. Has upcoming appointment with nephrology continue same meds for now.    Expected Outcome/Goals Addressed This Visit (Provider CCM goals/Provider Assessment and plan  Goal: CCM (Hypertension) Expected Outcome: Monitor, Self-Manage and Reduce Symptoms of Hypertension  Symptom Management Condition 3: Take medications exactly as prescribed   Attend all scheduled provider appointments Call pharmacy for medication refills 3-7 days in advance of running out of medications Check blood pressure daily Keep a blood pressure log Eat more whole grains, fruits and vegetables, lean meats and healthy fats Limit salt intake  Call provider office for new concerns or questions     Problem List Patient Active Problem List   Diagnosis Date Noted   Coronary artery disease of native artery of native heart with stable angina pectoris ([place]) 06/21/2021   Chronic kidney disease, stage 3b ([place]) 06/21/2021   Chronic pain following surgery or procedure 11/15/2020   Proliferative retinopathy of left eye due to diabetes mellitus ([place]) 01/28/2019   Esophageal dysphagia    Benign essential hypertension 12/11/2018   Secondary hyperparathyroidism of renal origin ([place]) 12/11/2018   Severe tobacco use disorder 08/01/2018   Atherosclerosis of artery of extremity with ulceration ([place]) 07/23/2018   Gastroesophageal reflux disease 05/26/2018   PAD (peripheral artery disease) ([place]) 10/30/2017   Chronic ulcer of heel, right, with unspecified severity ([place]) 10/16/2017   Hepatitis B core antibody positive 07/03/2017   Hypertriglyceridemia 07/03/2017   Chronic, continuous use of opioids 03/28/2016   Anxiety 03/28/2016   Pain in surgical scar 11/07/2015   Bulging eyes 01/24/2015  Leg  mass 01/24/2015   Renal transplant, status post 01/24/2015   Carotid arterial disease ([place]) 12/27/2014   Compulsive tobacco user syndrome 12/27/2014   Abnormal EKG 04/06/2013   Erectile dysfunction 11/29/2012   Obesity 11/28/2012   Type 2 diabetes mellitus with diabetic nephropathy ([place]) 11/28/2012   Hypertension 12/21/2011   Hyperlipidemia 12/21/2011   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 10/30/2011   Obstructive apnea 01/09/2011   History of other malignant neoplasm of skin 07/16/2006   Glaucoma 01/13/2006   Episodic paroxysmal anxiety disorder 03/26/1998    Medication Management  Current Outpatient Medications:    alprazolam (XANAX) 2 MG tablet, Take 1 tablet (2 mg total) by mouth 3 (three) times daily as needed for anxiety or sleep., Disp: 90 tablet, Rfl: 0   amLODipine (NORVASC) 10 MG tablet, Take 10 mg by mouth daily. , Disp: , Rfl:    calcitRIOL (ROCALTROL) 0.25 MCG capsule, Take 1 capsule (0.25 mcg total) by mouth daily., Disp: 10 capsule, Rfl: 0   carvedilol (COREG) 12.5 MG tablet, Take 1.5 tablets (18.75 mg total) by mouth 2 (two) times daily., Disp: 90 tablet, Rfl: 2   dapagliflozin propanediol ([name]) 10 MG TABS tablet, Take 1 tablet (10 mg total) by mouth daily before breakfast. Patient receives through [name] through Dec 2023, Disp: 90 tablet, Rfl: 3   insulin aspart (NOVOLOG) 100 UNIT/ML injection, Inject 15 Units into the skin 3 (three) times daily before meals., Disp: , Rfl:    insulin degludec (TRESIBA FLEXTOUCH) 100 UNIT/ML FlexTouch Pen, Inject 45 Units into the skin daily., Disp: , Rfl:    losartan (COZAAR) 100 MG tablet, Take 1 tablet (100 mg total) by mouth at bedtime., Disp: 10 tablet, Rfl: 0   mycophenolate (CELLCEPT) 500 MG tablet, Take 1,000 mg by mouth 2 (two) times daily., Disp: , Rfl:    omeprazole (PRILOSEC) 20 MG capsule, Take by mouth., Disp: , Rfl:    oxyCODONE (OXY IR/ROXICODONE) 5 MG immediate release tablet, Take 1-2 tablets (5-10 mg total) by  mouth every 6 (six) hours as needed for severe pain., Disp: 240 tablet, Rfl: 0   rosuvastatin (CRESTOR) 40 MG tablet, TAKE 1 TABLET EVERY DAY, Disp: 90 tablet, Rfl: 2   tacrolimus (PROGRAF) 1 MG capsule, Take 3 mg by mouth 2 (two) times daily. , Disp: , Rfl: 11   tamsulosin (FLOMAX) 0.4 MG CAPS capsule, Take 1 capsule (0.4 mg total) by mouth daily., Disp: 30 capsule, Rfl: 3   tirzepatide (MOUNJARO) 2.5 MG/0.5ML Pen, Inject 2.5 mg into the skin once a week., Disp: , Rfl:    triamcinolone cream (KENALOG) 0.1 %, APPLY DAILY TO INFLAMED BUMPS AS NEEDED, Disp: , Rfl:    BD INSULIN SYRINGE U/F 31G X 5/16" 1 ML MISC, , Disp: , Rfl:    cinacalcet (SENSIPAR) 30 MG tablet, Take 1 tablet (30 mg total) by mouth daily., Disp: 10 tablet, Rfl: 0   EDEX 40 MCG injection, 40 mcg by Intracavitary route as needed.  (Patient not taking: Reported on 03/01/2022), Disp: , Rfl:    fenofibrate (TRICOR) 145 MG tablet, , Disp: , Rfl:    naloxone (NARCAN) nasal spray 4 mg/0.1 mL, Place 1 spray into the nose once., Disp: , Rfl:    sildenafil (REVATIO) 20 MG tablet, , Disp: , Rfl:    tretinoin (RETIN-A) 0.05 % cream, Apply topically as needed. , Disp: , Rfl:    XYOSTED 102 MG/0.5ML SOAJ, , Disp: , Rfl:   Cognitive Assessment Identity Confirmed: : Name; DOB Cognitive  Status: Normal  Functional Assessment Hearing Difficulty or Deaf: no Wear Glasses or Blind: no (Wears readers if needed) Concentrating, Remembering or Making Decisions Difficulty (CP): no Difficulty Communicating: no Difficulty Eating/Swallowing: no Walking or Climbing Stairs Difficulty: yes Mobility Management: Reports difficulty with stair climbing Dressing/Bathing Difficulty: no Doing Errands Independently Difficulty (such as shopping) (CP): no Change in Functional Status Since Onset of Current Illness/Injury: no   Caregiver Assessment  Primary Source of Support/Comfort: significant other Name of Support/Comfort Primary Source: Sinda Du People in Home: significant other Name(s) of People in Home: [place] if Needed: significant other Family Caregiver Names: Sinda Du   Planned Interventions  CKD Reviewed current treatment plan related to Chronic Kidney Disease. Patient currently being managed by [facility]/Dr. Holley Raring. Assessed the patient's understanding of CKD. Reviewed medications and discussed importance of compliance. Reports fianc is assisting with medication management. Per medication profile, cinacalcet/Sensipar '30mg'$  was ordered in January d/t chronic kidney disease and secondary hyperparathyroidism. Patient currently not on dialysis but reports treatment via peritoneal dialysis in the past. Patient reports not taking this medication d/t oversight. Unsure if recent lab elevations were d/t patient not taking the medication. Collaborated with PCP and contacted the Nephrology team. Pending return call from Nephrology/ Discussed the impact of chronic kidney disease on daily life and mental health. Acknowledged and normalized feelings of disempowerment and fear. Active Listening. Screening for signs and symptoms of depression related to chronic disease state.   Reviewed prescribed diet.  Reviewed scheduled/upcoming provider appointments. Patient scheduled for follow up with the Dr. Holley Raring on 06/04/22. Assessed social determinant of health barriers      DM Discussed current plan for diabetes management. Reviewed medications and discussed importance of medication adherence. Reports taking as prescribed. Reports starting Mounjaro injections yesterday. Agreed to keep provider and care management team updated of concerns r/t medication tolerance and prescription cost. Discussed glucose monitoring and recent readings. Currently using a [employer]. Unable to provide fasting readings. Reports 30-day average of 209 mg/dl. Reports 7-day average of 219 mg/dl.  Provided  information regarding s/sx of hypoglycemia and hyperglycemia along with recommended interventions.  Discussed nutritional intake and importance of complying with a diabetic diet. Discussed importance of completing recommended DM preventive care. Reports completing foot care as advised. Currently being followed by Podiatry. Reports eye exam is up to date. Discussed importance of completing ordered labs as prescribed.  Assessed social determinant of health barriers.  HTN Reviewed provider's plan for hypertension management. Reviewed medications and indications for use. Reports fianc is assisting with medication management. Reports taking as prescribed and tolerating current regimen. Provided information regarding established blood pressure parameters along with indications for notifying a provider. Advised to monitor BP daily and record readings. Reports monitoring as advised. Reports systolic range in the 128N. Reports diastolic readings have ranged in the low 80s. Reviewed symptoms. Reports decreased energy and fatigue. Able to complete ADLs and self-care. Denies episodes of chest discomfort or palpitations. Denies episodes of headaches, dizziness, or visual changes. Reviewed symptoms that require immediate medical attention. Discussed compliance with recommended cardiac prudent diet. Encouraged to read nutrition labels, monitor sodium intake, and avoid highly processed foods when possible. Discussed complications of uncontrolled blood pressure.  Reviewed s/sx of heart attack, stroke and worsening symptoms that require immediate medical attention.  Interaction and coordination with outside resources, practitioners, and providers See CCM Referral  Care Plan: Available in MyChart

## 2022-03-01 NOTE — Chronic Care Management (AMB) (Signed)
Chronic Care Management   CCM RN Visit Note  03/01/2022 Name: James Moreno MRN: [id number] DOB: 1966/01/13  Subjective: James Moreno is a 56 y.o. year old male who is a primary care patient of Caryn Section, Kirstie Peri, MD. The patient was referred to the Chronic Care Management team for assistance with care management needs subsequent to provider initiation of CCM services and plan of care.    Today's Visit:  Engaged with patient by telephone for initial visit.       Goals Addressed             This Visit's Progress    Goal: CCM (Chronic Kidney Disease) Expected Outcome: Monitor, Self-Manage and Reduce Symptoms of Chronic Kidney Disease       Current Barriers:  Chronic Disease Management support and education needs related to Chronic Kidney Disease  Planned Interventions: Reviewed current treatment plan related to Chronic Kidney Disease. Patient currently being managed by [facility]/Dr. Holley Raring. Assessed the patient's understanding of CKD. Reviewed medications and discussed importance of compliance. Reports fianc is assisting with medication management. Per medication profile, cinacalcet/Sensipar '30mg'$  was ordered in January d/t chronic kidney disease and secondary hyperparathyroidism. Patient currently not on dialysis but reports treatment via peritoneal dialysis in the past. Patient reports not taking this medication d/t oversight. Unsure if recent lab elevations were d/t patient not taking the medication. Collaborated with PCP and contacted the Nephrology team. Pending return call from Nephrology/ Discussed the impact of chronic kidney disease on daily life and mental health. Acknowledged and normalized feelings of disempowerment and fear. Active Listening. Screening for signs and symptoms of depression related to chronic disease state.   Reviewed prescribed diet.  Reviewed scheduled/upcoming provider appointments. Patient scheduled for follow up with the Dr. Holley Raring on  06/04/22. Assessed social determinant of health barriers     Lab Results  Component Value Date   CREATININE 2.81 (H) 02/12/2022   CREATININE 1.72 (H) 06/20/2020   CREATININE 1.92 (H) 09/17/2019    BP Readings from Last 3 Encounters:  02/12/22 138/70  01/30/22 (!) 150/60  06/13/21 (!) 151/89    Lab Results  Component Value Date   BUN 36 (H) 02/12/2022     Estimated Creatinine Clearance: 35 mL/min (A) (by C-G formula based on SCr of 2.81 mg/dL (H)).    Symptom Management: Call pharmacy for medication refills 3-7 days in advance of running out of medications Take medication exactly as prescribed Call provider office for new concerns or questions  Adjust medications as advised by the Nephrology team Complete clinic visit with Nephrology team as scheduled   Follow Up Plan:  Pending return call from the Nephrology team regarding medications. Will follow up with patient within the next week        Goal: CCM (Diabetes) Expected Outcome: Monitor, Self-Manage and Reduce Symptoms of Diabetes       Current Barriers:  Chronic Disease Management support and education needs related to Diabetes  Planned Interventions: Discussed current plan for diabetes management. Reviewed medications and discussed importance of medication adherence. Reports taking as prescribed. Reports starting Mounjaro injections yesterday. Agreed to keep provider and care management team updated of concerns r/t medication tolerance and prescription cost. Discussed glucose monitoring and recent readings. Currently using a [employer]. Unable to provide fasting readings. Reports 30-day average of 209 mg/dl. Reports 7-day average of 219 mg/dl.  Provided information regarding s/sx of hypoglycemia and hyperglycemia along with recommended interventions.  Discussed nutritional intake and importance of complying with  a diabetic diet. Discussed importance of completing recommended DM preventive care. Reports completing  foot care as advised. Currently being followed by Podiatry. Reports eye exam is up to date. Discussed importance of completing ordered labs as prescribed.  Assessed social determinant of health barriers.  Lab Results  Component Value Date   HGBA1C 6.9 11/28/2021    Symptom Management: Take medications as prescribed   Attend all scheduled provider appointments Call pharmacy for medication refills 3-7 days in advance of running out of medications Monitor blood glucose readings and respond to [facility] alarms appropriately Read food labels for fat, fiber, carbohydrates and portion size Check feet daily for cuts, sores or redness Wash and dry feet carefully every day Wear comfortable, cotton socks Wear comfortable, well-fitting shoes Call provider office for new concerns or questions   Follow Up Plan:  Will follow up next month       Goal: CCM (Hypertension) Expected Outcome: Monitor, Self-Manage and Reduce Symptoms of Hypertension       Current Barriers:  Chronic Disease Management support and education needs related to Hypertension  Planned Interventions: Reviewed provider's plan for hypertension management. Reviewed medications and indications for use. Reports fianc is assisting with medication management. Reports taking as prescribed and tolerating current regimen. Provided information regarding established blood pressure parameters along with indications for notifying a provider. Advised to monitor BP daily and record readings. Reports monitoring as advised. Reports systolic range in the 062I. Reports diastolic readings have ranged in the low 80s. Reviewed symptoms. Reports decreased energy and fatigue. Able to complete ADLs and self-care. Denies episodes of chest discomfort or palpitations. Denies episodes of headaches, dizziness, or visual changes. Reviewed symptoms that require immediate medical attention. Discussed compliance with recommended cardiac prudent diet.  Encouraged to read nutrition labels, monitor sodium intake, and avoid highly processed foods when possible. Discussed complications of uncontrolled blood pressure.  Reviewed s/sx of heart attack, stroke and worsening symptoms that require immediate medical attention.   BP Readings from Last 3 Encounters:  02/12/22 138/70  01/30/22 (!) 150/60  06/13/21 (!) 151/89     Symptom Management: Take medications exactly as prescribed   Attend all scheduled provider appointments Call pharmacy for medication refills 3-7 days in advance of running out of medications Check blood pressure daily Keep a blood pressure log Eat more whole grains, fruits and vegetables, lean meats and healthy fats Limit salt intake  Call provider office for new concerns or questions    Follow Up Plan:  Will follow up next month            PLAN Pending return call from the Nephrology team. Will follow up to update patient within the next week   [facility] [phone]

## 2022-03-02 ENCOUNTER — Ambulatory Visit: Payer: Self-pay

## 2022-03-02 ENCOUNTER — Ambulatory Visit: Payer: Medicare HMO

## 2022-03-02 DIAGNOSIS — E782 Mixed hyperlipidemia: Secondary | ICD-10-CM

## 2022-03-02 DIAGNOSIS — E1121 Type 2 diabetes mellitus with diabetic nephropathy: Secondary | ICD-10-CM

## 2022-03-02 DIAGNOSIS — N1832 Chronic kidney disease, stage 3b: Secondary | ICD-10-CM

## 2022-03-02 NOTE — Progress Notes (Unsigned)
Chronic Care Management Pharmacy Note  03/02/2022 Name:  James Moreno MRN:  [id number] DOB:  30-Dec-1965  Summary: Patient presents for CCM follow-up.   Recommendations/Changes made from today's visit: Continue current medications  Plan: [name] follow-up 1 month  Subjective: James Moreno is an 56 y.o. year old male who is a primary patient of Fisher, Kirstie Peri, MD.  The CCM team was consulted for assistance with disease management and care coordination needs.    Engaged with patient by telephone for follow up visit in response to provider referral for pharmacy case management and/or care coordination services.   Consent to Services:  The patient was given information about Chronic Care Management services, agreed to services, and gave verbal consent prior to initiation of services.  Please see initial visit note for detailed documentation.   Patient Care Team: Birdie Sons, MD as PCP - General (Family Medicine) Rockey Situ Kathlene November, MD as PCP - Cardiology (Cardiology) Pa, Ogilvie (Optometry) Anthonette Legato, MD (Nephrology) Long, Thomes Cake, MD as Referring Physician (Vascular Surgery) Isaias Sakai, MD as Referring Physician (Ophthalmology) Lin Landsman, MD as Consulting Physician (Gastroenterology) Wellington Hampshire, MD as Consulting Physician (Cardiology) Germaine Pomfret, [facility] (Pharmacist) Garrel Ridgel, DPM as Consulting Physician (Podiatry) Neldon Labella, RN as Case Manager  Recent office visits: 02/12/22: Patient presented to Dr. Caryn Section for follow-up. Tamsulosin.   06/13/21: Patient presented to Dr. Caryn Section for follow-up.    Recent consult visits: 02/26/22: Patient presented to Dr. Holley Raring (nephrology)  01/30/22: Patient presented to Dr. Rockey Situ (cardiology) for follow-up. Carvedilol 18.75 mg twice daily.  11/28/21: Patient presented to Dr. Honor Junes (endocrinology). Ozempic 1 mg weekly.  10/25/21: Patient presented to Dr. Holley Raring (nephrology)   07/27/21: Patient presented to Dr. Honor Junes (endocrinology). BP 110/70. Ozempic 0.5 mg weekly.  07/10/21: Patient presented to Dr. Holley Raring (Nephrology).   Hospital visits: None in previous 6 months  Objective:  Lab Results  Component Value Date   CREATININE 2.81 (H) 02/12/2022   BUN 36 (H) 02/12/2022   GFRNONAA 39 (L) 09/17/2019   GFRAA 45 (L) 09/17/2019   NA 130 (L) 02/12/2022   K 3.9 02/12/2022   CALCIUM 9.8 02/12/2022   CO2 20 02/12/2022    Lab Results  Component Value Date/Time   HGBA1C 6.9 11/28/2021 12:00 AM   HGBA1C 8.1 02/28/2021 12:00 AM   MICROALBUR 100 12/31/2016 02:13 PM    Last diabetic Eye exam:  Lab Results  Component Value Date/Time   HMDIABEYEEXA Retinopathy (A) 06/14/2021 12:00 AM    Last diabetic Foot exam: No results found for: "HMDIABFOOTEX"   Lab Results  Component Value Date   CHOL 141 06/20/2020   HDL 46 06/20/2020   LDLCALC 53 06/20/2020   TRIG 268 (H) 06/20/2020   CHOLHDL 3.1 06/20/2020       Latest Ref Rng & Units 02/12/2022    2:14 PM 06/20/2020    9:03 AM 09/17/2019    2:17 PM  Hepatic Function  Total Protein 6.0 - 8.5 g/dL   6.5   Albumin 3.8 - 4.9 g/dL 3.9  4.2  4.3   AST 0 - 40 IU/L   9   ALT 0 - 44 IU/L   9   Alk Phosphatase 48 - 121 IU/L   131   Total Bilirubin 0.0 - 1.2 mg/dL   0.6     Lab Results  Component Value Date/Time   TSH 1.34 12/12/2018 12:00 AM   TSH 1.72 06/13/2006 12:00 AM  Latest Ref Rng & Units 12/12/2018   12:00 AM 10/21/2015    1:10 PM 06/21/2013    2:29 PM  CBC  WBC  6.7     8.9  9.7   Hemoglobin 13.5 - 17.5 14.8     16.3  10.1   Hematocrit 41 - 53 43     46.7  28.9   Platelets 150 - 399 182     140  175      This result is from an external source.    No results found for: "VD25OH"  Clinical ASCVD: Yes  The 10-year ASCVD risk score (Arnett DK, et al., 2019) is: 15.7%   Values used to calculate the score:     Age: 31 years     Sex: Male     Is Non-Hispanic African American: No      Diabetic: Yes     Tobacco smoker: Yes     Systolic Blood Pressure: 010 mmHg     Is BP treated: Yes     HDL Cholesterol: 46 mg/dL     Total Cholesterol: 141 mg/dL       03/01/2022    1:00 PM 02/28/2022   10:56 AM 02/12/2022    1:35 PM  Depression screen PHQ 2/9  Decreased Interest 0 0 0  Down, Depressed, Hopeless 1 0 0  PHQ - 2 Score 1 0 0  Altered sleeping  0 0  Tired, decreased energy  1 1  Change in appetite  0 0  Feeling bad or failure about yourself   0 0  Trouble concentrating  0 0  Moving slowly or fidgety/restless  0 0  Suicidal thoughts  0 0  PHQ-9 Score  1 1  Difficult doing work/chores  Not difficult at all Not difficult at all      Social History   Tobacco Use  Smoking Status Every Day   Packs/day: 1.00   Years: 41.00   Total pack years: 41.00   Types: Cigarettes  Smokeless Tobacco Never  Tobacco Comments   since age 70.   BP Readings from Last 3 Encounters:  02/12/22 138/70  01/30/22 (!) 150/60  06/13/21 (!) 151/89   Pulse Readings from Last 3 Encounters:  02/12/22 86  01/30/22 95  06/13/21 90   Wt Readings from Last 3 Encounters:  02/28/22 216 lb (98 kg)  01/30/22 216 lb (98 kg)  08/23/21 238 lb (108 kg)    Assessment/Interventions: Review of patient past medical history, allergies, medications, health status, including review of consultants reports, laboratory and other test data, was performed as part of comprehensive evaluation and provision of chronic care management services.   SDOH:  (Social Determinants of Health) assessments and interventions performed: No; completed in Mar 2023.   CCM Care Plan  Allergies  Allergen Reactions   No Known Allergies     Medications Reviewed Today     Reviewed by Neldon Labella, RN (Registered Nurse) on 03/01/22 at 19  Med List Status: <None>   Medication Order Taking? Sig Documenting Provider Last Dose Status Informant  alprazolam (XANAX) 2 MG tablet [id number]  Take 1 tablet (2 mg total) by  mouth 3 (three) times daily as needed for anxiety or sleep. Birdie Sons, MD  Active   amLODipine (NORVASC) 10 MG tablet [id number]  Take 10 mg by mouth daily.  [provider]  Active   BD INSULIN SYRINGE U/F 31G X 5/16" 1 ML MISC [id number]   [provider]  Active   calcitRIOL (ROCALTROL) 0.25 MCG capsule [id number]  Take 1 capsule (0.25 mcg total) by mouth daily. Birdie Sons, MD  Active   carvedilol (COREG) 12.5 MG tablet [id number]  Take 1.5 tablets (18.75 mg total) by mouth 2 (two) times daily. Minna Merritts, MD  Active   cinacalcet (SENSIPAR) 30 MG tablet [id number]  Take 1 tablet (30 mg total) by mouth daily. Birdie Sons, MD  Active   dapagliflozin propanediol ([name]) 10 MG TABS tablet [id number]  Take 1 tablet (10 mg total) by mouth daily before breakfast. Patient receives through [name] through Dec 2023 Birdie Sons, MD  Active   EDEX 40 MCG injection [id number]  40 mcg by Intracavitary route as needed.  [provider]  Active   fenofibrate (TRICOR) 145 MG tablet [id number]    Patient not taking: Reported on 02/28/2022   [provider]  Active   insulin aspart (NOVOLOG) 100 UNIT/ML injection [id number]  Inject 15 Units into the skin 3 (three) times daily before meals. [provider]  Active Self           Med Note Raeford Razor Aug 15, 2020  3:50 PM) Frances Maywood through [name] Patient Assistance through Dec 2022   insulin degludec [facility]) 100 UNIT/ML FlexTouch Pen [id number]  Inject 45 Units into the skin daily. [provider]  Active            Med Note Raeford Razor Aug 15, 2020  3:50 PM) Frances Maywood through [name] Patient Assistance through Dec 2022  losartan (COZAAR) 100 MG tablet [id number]  Take 1 tablet (100 mg total) by mouth at bedtime. Birdie Sons, MD  Active   mycophenolate (CELLCEPT) 500 MG tablet [id number]  Take 1,000 mg by mouth 2 (two) times daily.  [provider]  Active   naloxone [facility]) nasal spray 4 mg/0.1 mL [id number]  Place 1 spray into the nose once.   Patient not taking: Reported on 02/28/2022   [provider]  Active   omeprazole (PRILOSEC) 20 MG capsule [id number]  Take by mouth. [provider]  Active   oxyCODONE (OXY IR/ROXICODONE) 5 MG immediate release tablet [id number]  Take 1-2 tablets (5-10 mg total) by mouth every 6 (six) hours as needed for severe pain. Birdie Sons, MD  Active   rosuvastatin (CRESTOR) 40 MG tablet [id number]  TAKE 1 TABLET EVERY DAY Gollan, Kathlene November, MD  Active   sildenafil (REVATIO) 20 MG tablet [id number]   [provider]  Active   tacrolimus (PROGRAF) 1 MG capsule [id number]  Take 3 mg by mouth 2 (two) times daily.  [provider]  Active Self           Med Note Kenton Kingfisher, Germaine Pomfret Oct 30, 2017 11:51 AM)    tamsulosin (FLOMAX) 0.4 MG CAPS capsule [id number]  Take 1 capsule (0.4 mg total) by mouth daily. Birdie Sons, MD  Active   tirzepatide [facility]) 2.5 MG/0.5ML Pen [id number]  Inject 2.5 mg into the skin once a week. [provider]  Active   tretinoin (RETIN-A) 0.05 % cream [id number]  Apply topically as needed.  [provider]  Active   triamcinolone cream (KENALOG) 0.1 % [id number]  APPLY DAILY TO INFLAMED BUMPS AS NEEDED [provider]  Active   XYOSTED 50 MG/0.5ML Darden Palmer [id number]   [provider]  Active  Patient Active Problem List   Diagnosis Date Noted   Coronary artery disease of native artery of native heart with stable angina pectoris ([place]) 06/21/2021   Chronic kidney disease, stage 3b ([place]) 06/21/2021   Chronic pain following surgery or procedure 11/15/2020   Proliferative retinopathy of left eye due to diabetes mellitus ([place]) 01/28/2019   Esophageal dysphagia    Benign essential hypertension 12/11/2018   Secondary hyperparathyroidism of renal origin ([place]) 12/11/2018    Severe tobacco use disorder 08/01/2018   Atherosclerosis of artery of extremity with ulceration ([place]) 07/23/2018   Gastroesophageal reflux disease 05/26/2018   PAD (peripheral artery disease) ([place]) 10/30/2017   Chronic ulcer of heel, right, with unspecified severity ([place]) 10/16/2017   Hepatitis B core antibody positive 07/03/2017   Hypertriglyceridemia 07/03/2017   Chronic, continuous use of opioids 03/28/2016   Anxiety 03/28/2016   Pain in surgical scar 11/07/2015   Bulging eyes 01/24/2015   Leg mass 01/24/2015   Renal transplant, status post 01/24/2015   Carotid arterial disease ([place]) 12/27/2014   Compulsive tobacco user syndrome 12/27/2014   Abnormal EKG 04/06/2013   Erectile dysfunction 11/29/2012   Obesity 11/28/2012   Type 2 diabetes mellitus with diabetic nephropathy ([place]) 11/28/2012   Hypertension 12/21/2011   Hyperlipidemia 12/21/2011   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 10/30/2011   Obstructive apnea 01/09/2011   History of other malignant neoplasm of skin 07/16/2006   Glaucoma 01/13/2006   Episodic paroxysmal anxiety disorder 03/26/1998    Immunization History  Administered Date(s) Administered   Influenza Split 12/05/2010   Influenza,inj,Quad PF,6+ Mos 12/31/2016, 01/27/2019, 01/02/2021   Influenza-Unspecified 12/26/2020   PFIZER(Purple Top)SARS-COV-2 Vaccination 06/18/2019, 07/09/2019, 02/22/2020, 01/31/2021   Pneumococcal Polysaccharide-23 10/24/2010   Tdap 12/05/2010    Conditions to be addressed/monitored:  Hypertension, Hyperlipidemia, Diabetes, Coronary Artery Disease, GERD, Anxiety, Tobacco use and History of Renal Transplant   There are no care plans that you recently modified to display for this patient.    Medication Assistance:  Joni Reining, Cira Servant obtained through [name] medication assistance program.  Enrollment ends Dec 2023  Wilder Glade obtained through [name] medication assistance program.  Enrollment ends Dec 2023  Patient's  preferred pharmacy is:  [facility] [address], [place] [address] Phone: [phone] Fax: [phone]  [facility] ([facility] Bundle Only) - [place], [address] [place] [address] Phone: [phone] Fax: [place], [place] [address] [place] [address] Phone: [phone] Fax: [phone]  [place], [place] [address] Phone: [phone] Fax: [phone]  Uses pill box? Yes Pt endorses 100% compliance  We discussed: Current pharmacy is preferred with insurance plan and patient is satisfied with pharmacy services Patient decided to: Continue current medication management strategy  Care Plan and Follow Up Patient Decision:  Patient agrees to Care Plan and Follow-up.  Compliance/Adherence/Medication fill history: Care Gaps: HIV Shingrix Tdap Covid Booster   Star-Rating Drugs: Rosuvastatin 40 mg last filled on 07/20/2021 for a 90-Day supply via [place] Losartan 100 mg last filled on 06/05/2021 for a 90-Day supply via [place]: Telephone follow up appointment with care management team member scheduled for:  03/02/2022 at 1:00 PM  [name], PharmD, [place], [name]  Clinical Pharmacist Practitioner  [facility] [phone]  Current Barriers:  Unable to independently afford  treatment regimen Unable to achieve control of Diabetes   Pharmacist Clinical Goal(s):  Over the next 90 days, patient will verbalize ability to afford treatment regimen achieve control of Diabetes as evidenced by A1c less than 7% through collaboration with PharmD and provider.   Interventions: 1:1 collaboration with Birdie Sons, MD regarding development and update of comprehensive plan of care as evidenced by  provider attestation and co-signature Inter-disciplinary care team collaboration (see longitudinal plan of care) Comprehensive medication review performed; medication list updated in electronic medical record  Hypertension (BP goal <130/80) -Controlled -Current treatment: Amlodipine 10 mg daily Carvedilol 12.5 mg twice daily   Losartan 100 mg daily   -Medications previously tried: NA  -Current home readings:  -Denies hypotensive/hypertensive symptoms -Recommended to continue current medication  Hyperlipidemia: (LDL goal < 70) -History of PAD, CAD  -Controlled -Current treatment: Rosuvastatin 40 mg daily  -Medications previously tried: NA  -Educated on Importance of limiting foods high in cholesterol; -Recommended to continue current medication  Diabetes (A1c goal <7%) -Uncontrolled -Managed by Dr. Honor Junes -Current medications: [name] 10 mg daily: Appropriate, Query effective Novolog 10 units three times daily + 2 units for every 50 units above 150: Appropriate, Query effective  Mounjaro 2.5 mg weekly  Tresiba 40 units daily (0.44 u/kg): Appropriate, Query effective -Medications previously tried: Lantus (Formulary)  -Current home glucose readings  Target 6/13-6/26 7/19-8/1 8/29-9/11 9/12-9/18 11/25-12/8  Number of days worn ? 14 days _0 % of time active ? 70% 86% 75% 88% 85% 88%  Mean Glucose (mg/dL)  167 204 129 171 225  GMI  7.3% 8.2% 6.4% 7.4% 8.7%  Glycemic Variability (%CV) ?36% 42.5% 49.5% 42.9% 31.4% 46.1%  Time above >250 mg/dL <5% 12% 29% 5% 8% 35%  Time above 70-180 mg/dL <25% 25% 16% 13% 35% 21%  Time in range: 70-180 mg/dL >70% 62% 54% 75% 55% 44%  Time below 70 mg/dL <4% 1% 1% 7% 2% 0%  Time below 54 mg/dL <1%   0%  0%  -Dietary Patterns: Dramatic changes to diet, cut out sodas, cut out fried foods, and has cut out late night snacking.  -Blood sugars are elevated in the setting of discontinuing Ozempic. He has changed to [facility] due to  persistent nausea and vomiting with Ozempic 1 mg. He has decided to switch to [facility] as his sister has tolerated that medication well. He is aware there is no patient assistance available at this time for [facility] and will plan to pay for it   Anxiety (Goal: Maintain stable mood and sleep) -Controlled -Current treatment: Alprazolam 2 mg three times daily as needed - Sleep  -Medications previously tried/failed: NA -PHQ9: 0 -GAD7: 5 -  Renal Transplant  (Goal: prevent rejection of kidney ) -Managed by Dr. Holley Raring  -Controlled -Current treatment  Mycophenolate 500 mg 2 tablets twice daily  Tacrolimus 1 mg 3 capsules twice daily  -Medications previously tried: Myfortic (cost) -Recommended to continue current medication  Tobacco use (Goal Quit smoking) -Uncontrolled -Previous quit attempts: Chantix (nightmares), nicotine gum (ulcers), nicotine patch (stickiness) -Current treatment  17-18 cigarettes daily  -Patient smokes Within 30 minutes of waking -Patient triggers include: stress and finishing a meal -Continue working to cut down on cigarette use.   Chronic Kidney Disease Stage 3a  -All medications assessed for renal dosing and appropriateness in chronic kidney disease. -Recommended to continue current medication  Patient Goals/Self-Care Activities Over the next 90 days, patient will:  - check glucose 2-3 times  daily , document, and provide at future appointments -check blood pressure 2-3 times weekly , document, and provide at future appointments -decrease cigarette use   Follow Up Plan: ***

## 2022-03-05 NOTE — Patient Instructions (Signed)
Visit Information It was great speaking with you today!  Please let me know if you have any questions about our visit.  Patient Care Plan: General Pharmacy (Adult)     Problem Identified: Hypertension, Hyperlipidemia, Diabetes, Coronary Artery Disease, GERD, Anxiety, Tobacco use and History of Renal Transplant   Priority: High     Long-Range Goal: Patient-Specific Goal   Start Date: 05/19/2020  Expected End Date: 12/05/2022  This Visit's Progress: On track  Recent Progress: On track  Priority: High  Note:   Current Barriers:  Unable to independently afford treatment regimen Unable to achieve control of Diabetes   Pharmacist Clinical Goal(s):  Over the next 90 days, patient will verbalize ability to afford treatment regimen achieve control of Diabetes as evidenced by A1c less than 7% through collaboration with PharmD and provider.   Interventions: 1:1 collaboration with Birdie Sons, MD regarding development and update of comprehensive plan of care as evidenced by provider attestation and co-signature Inter-disciplinary care team collaboration (see longitudinal plan of care) Comprehensive medication review performed; medication list updated in electronic medical record  Hypertension (BP goal <130/80) -Controlled -Current treatment: Amlodipine 10 mg daily Carvedilol 12.5 mg twice daily   Losartan 100 mg daily   -Medications previously tried: NA  -Current home readings:  -Denies hypotensive/hypertensive symptoms -Recommended to continue current medication  Hyperlipidemia: (LDL goal < 70) -History of PAD, CAD  -Controlled -Current treatment: Rosuvastatin 40 mg daily  -Medications previously tried: NA  -Educated on Importance of limiting foods high in cholesterol; -Will reach out to cardiology regarding switching rosuvastatin to atorvastatin in setting of worsening renal function.  -Recommended to continue current medication  Diabetes (A1c goal  <7%) -Uncontrolled -Managed by Dr. Honor Junes -Current medications: [name] 10 mg daily: Appropriate, Query effective Novolog 10 units three times daily + 2 units for every 50 units above 150: Appropriate, Query effective  Mounjaro 2.5 mg weekly  Tresiba 40 units daily (0.44 u/kg): Appropriate, Query effective -Medications previously tried: Lantus (Formulary)  -Current home glucose readings  Target 6/13-6/26 7/19-8/1 8/29-9/11 9/12-9/18 11/25-12/8  Number of days worn ? 14 days '14 14 14 14 14  '$ % of time active ? 70% 86% 75% 88% 85% 88%  Mean Glucose (mg/dL)  167 204 129 171 225  GMI  7.3% 8.2% 6.4% 7.4% 8.7%  Glycemic Variability (%CV) ?36% 42.5% 49.5% 42.9% 31.4% 46.1%  Time above >250 mg/dL <5% 12% 29% 5% 8% 35%  Time above 70-180 mg/dL <25% 25% 16% 13% 35% 21%  Time in range: 70-180 mg/dL >70% 62% 54% 75% 55% 44%  Time below 70 mg/dL <4% 1% 1% 7% 2% 0%  Time below 54 mg/dL <1%   0%  0%  -Dietary Patterns: Dramatic changes to diet, cut out sodas, cut out fried foods, and has cut out late night snacking.  -Blood sugars are elevated in the setting of discontinuing Ozempic. He has changed to [facility] due to persistent nausea and vomiting with Ozempic 1 mg. He has decided to switch to [facility] as his sister has tolerated that medication well. He is aware there is no patient assistance available at this time for [facility] and will plan to pay for it at his pharmacy.  -Increase Tyler Aas to 42 units daily in setting of elevated blood sugars while in process of titrating Mounjaro.   Anxiety (Goal: Maintain stable mood and sleep) -Controlled -Current treatment: Alprazolam 2 mg three times daily as needed - Sleep  -Medications previously tried/failed: NA -PHQ9: 0 -GAD7: 5 -  Renal Transplant  (Goal: prevent rejection of kidney ) -Managed by Dr. Holley Raring  -Controlled -Current treatment  Mycophenolate 500 mg 2 tablets twice daily  Tacrolimus 1 mg 3 capsules twice daily  -Medications  previously tried: Myfortic (cost) -Recommended to continue current medication  Tobacco use (Goal Quit smoking) -Uncontrolled -Previous quit attempts: Chantix (nightmares), nicotine gum (ulcers), nicotine patch (stickiness) -Current treatment  17-18 cigarettes daily  -Patient smokes Within 30 minutes of waking -Patient triggers include: stress and finishing a meal -Continue working to cut down on cigarette use.   Chronic Kidney Disease Stage 3a  -All medications assessed for renal dosing and appropriateness in chronic kidney disease. -Recommended to continue current medication  Patient Goals/Self-Care Activities Over the next 90 days, patient will:  - check glucose 2-3 times daily , document, and provide at future appointments -check blood pressure 2-3 times weekly , document, and provide at future appointments -decrease cigarette use   Follow Up Plan: Telephone follow up appointment with care management team member scheduled for:  04/02/2022 at 3:45 PM    Patient agreed to services and verbal consent obtained.   Patient verbalizes understanding of instructions and care plan provided today and agrees to view in [place]. Active MyChart status and patient understanding of how to access instructions and care plan via MyChart confirmed with patient.     [name], PharmD, Para [place], [name]  Clinical Pharmacist Practitioner  [facility] [phone]

## 2022-03-13 ENCOUNTER — Ambulatory Visit: Payer: Medicare HMO | Admitting: Gastroenterology

## 2022-03-13 ENCOUNTER — Other Ambulatory Visit: Payer: Self-pay

## 2022-03-20 NOTE — Telephone Encounter (Signed)
Medication Refill - Medication:   Disp Refills Start End   oxyCODONE (OXY IR/ROXICODONE) 5 MG immediate release tablet 240 tablet 0 02/21/2022      Has the patient contacted their pharmacy? No. (Agent: If no, request that the patient contact the pharmacy for the refill. If patient does not wish to contact the pharmacy document the reason why and proceed with request.) (Agent: If yes, when and what did the pharmacy advise?)states has to call Dr Caryn Section  Preferred Pharmacy (with phone number or street name):  [place], [place] [facility] [place]  [address] [place] [address]  Phone: [phone] Fax: [phone]  Hours: Not open 24 hours   Has the patient been seen for an appointment in the last year OR does the patient have an upcoming appointment? Yes.     Agent: Please be advised that RX refills may take up to 3 business days. We ask that you follow-up with your pharmacy.

## 2022-03-24 ENCOUNTER — Other Ambulatory Visit: Payer: Self-pay | Admitting: Family Medicine

## 2022-03-24 DIAGNOSIS — L905 Scar conditions and fibrosis of skin: Secondary | ICD-10-CM

## 2022-03-24 DIAGNOSIS — M79604 Pain in right leg: Secondary | ICD-10-CM

## 2022-03-24 MED ORDER — OXYCODONE HCL 5 MG PO TABS: 5.0000 mg | ORAL_TABLET | Freq: Four times a day (QID) | ORAL | 0 refills | Status: DC | PRN

## 2022-03-25 DIAGNOSIS — N1832 Chronic kidney disease, stage 3b: Secondary | ICD-10-CM

## 2022-03-25 DIAGNOSIS — I10 Essential (primary) hypertension: Secondary | ICD-10-CM

## 2022-03-25 DIAGNOSIS — E1121 Type 2 diabetes mellitus with diabetic nephropathy: Secondary | ICD-10-CM

## 2022-03-25 DIAGNOSIS — E782 Mixed hyperlipidemia: Secondary | ICD-10-CM

## 2022-03-25 DIAGNOSIS — Z794 Long term (current) use of insulin: Secondary | ICD-10-CM

## 2022-03-27 ENCOUNTER — Other Ambulatory Visit: Payer: Self-pay

## 2022-03-28 ENCOUNTER — Telehealth: Payer: Self-pay | Admitting: Gastroenterology

## 2022-03-28 ENCOUNTER — Ambulatory Visit: Payer: Medicare HMO | Admitting: Gastroenterology

## 2022-03-28 NOTE — Chronic Care Management (AMB) (Signed)
  Chronic Care Management   CCM RN Visit Note   Name: James Moreno MRN: [id number] DOB: 14-Nov-1965  Subjective: James Moreno is a 57 y.o. year old male who is a primary care patient of James Moreno, James Peri, MD. The patient was referred to the Chronic Care Management team for assistance with care management needs subsequent to provider initiation of CCM services and plan of care.    Today's Visit:  Collaboration with CCM Pharmacist and call to [facility] regarding plan for medications.      Goals Addressed             This Visit's Progress    Goal: CCM (Chronic Kidney Disease) Expected Outcome: Monitor, Self-Manage and Reduce Symptoms of Chronic Kidney Disease       Current Barriers:  Chronic Disease Management support and education needs related to Chronic Kidney Disease  Planned Interventions: Reviewed current treatment plan related to Chronic Kidney Disease. Patient currently being managed by [facility]/James Moreno. Assessed the patient's understanding of CKD. Reviewed medications and discussed importance of compliance. Reports fianc is assisting with medication management. Per medication profile, cinacalcet/Sensipar '30mg'$  was ordered in January d/t chronic kidney disease and secondary hyperparathyroidism. Patient currently not on dialysis but reports treatment via peritoneal dialysis in the past. Patient reports not taking this medication d/t oversight. Unsure if recent lab elevations were d/t patient not taking the medication. Collaborated with PCP and contacted the Nephrology team. Pending return call from Nephrology/ Discussed the impact of chronic kidney disease on daily life and mental health. Acknowledged and normalized feelings of disempowerment and fear. Active Listening. Screening for signs and symptoms of depression related to chronic disease state.   Reviewed prescribed diet.  Reviewed scheduled/upcoming provider appointments. Patient scheduled  for follow up with the James Moreno on 06/04/22. Assessed social determinant of health barriers  Update 03/02/22: Collaborated CCM Pharmacist regarding pending plan for Sensipar. Agreed to update James Moreno during scheduled outreach today. Additional call placed to [place]. Pending return call.  Lab Results  Component Value Date   CREATININE 2.81 (H) 02/12/2022   CREATININE 1.72 (H) 06/20/2020   CREATININE 1.92 (H) 09/17/2019    BP Readings from Last 3 Encounters:  02/12/22 138/70  01/30/22 (!) 150/60  06/13/21 (!) 151/89    Lab Results  Component Value Date   BUN 36 (H) 02/12/2022     Estimated Creatinine Clearance: 35 mL/min (A) (by C-G formula based on SCr of 2.81 mg/dL (H)).    Symptom Management: Call pharmacy for medication refills 3-7 days in advance of running out of medications Take medication exactly as prescribed Call provider office for new concerns or questions  Adjust medications as advised by the Nephrology team Complete clinic visit with Nephrology team as scheduled   Follow Up Plan:  Pending return call from the Nephrology team regarding medications.            PLAN: Pending return call from Nephrology team Will follow up with patient with update once medication plan is confirmed.  James Latino RN Care Manager/Chronic Care Management [phone]

## 2022-03-28 NOTE — Telephone Encounter (Signed)
Patient calling to reschedule his appointment for today. I did not know where you wanted me to reschedule him for.

## 2022-03-28 NOTE — Telephone Encounter (Signed)
Reschedule patient appointment and called and left a detail message informed patient that if this did not work for him to call us and let us know

## 2022-03-31 IMAGING — US US ABDOMEN LIMITED
1 series · 14 of 25 positions shown · non-contrast
Comparison: Remote CT 10/31/2010

CLINICAL DATA: Elevated alkaline phosphatase.

EXAM:
ULTRASOUND ABDOMEN LIMITED RIGHT UPPER QUADRANT

[Series 1: us abdomen limited · 0.21mm/px · 14 of 29 slices shown]
[im 1/29]
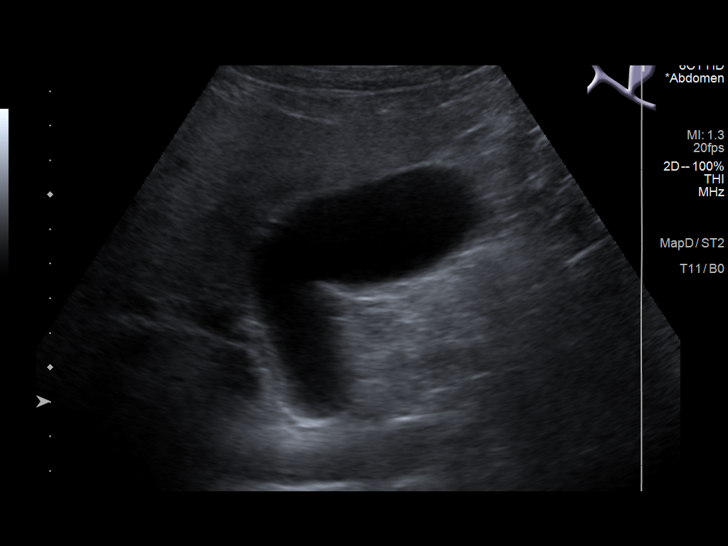
[im 3/29]
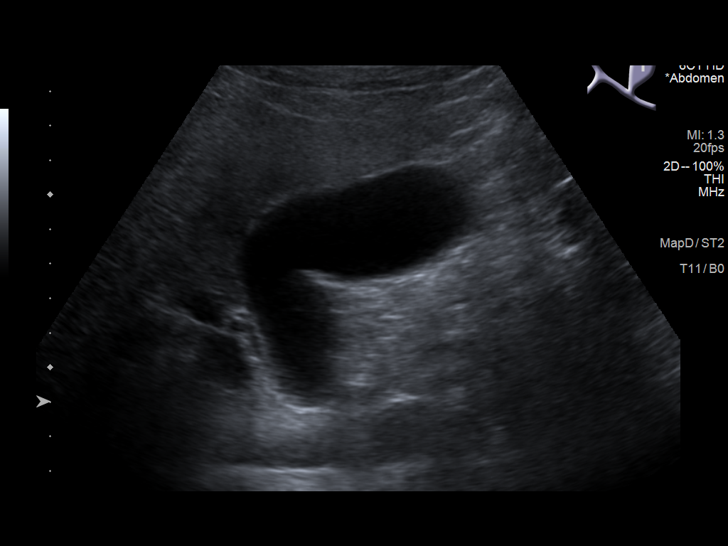
[im 5/29]
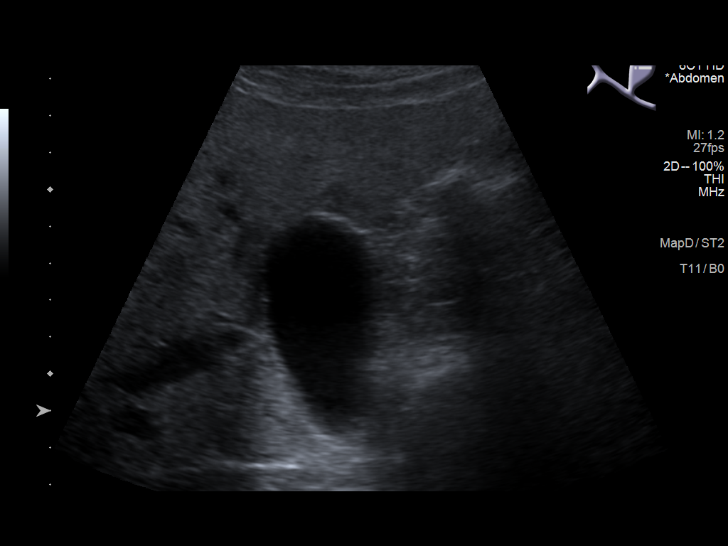
[im 8/29]
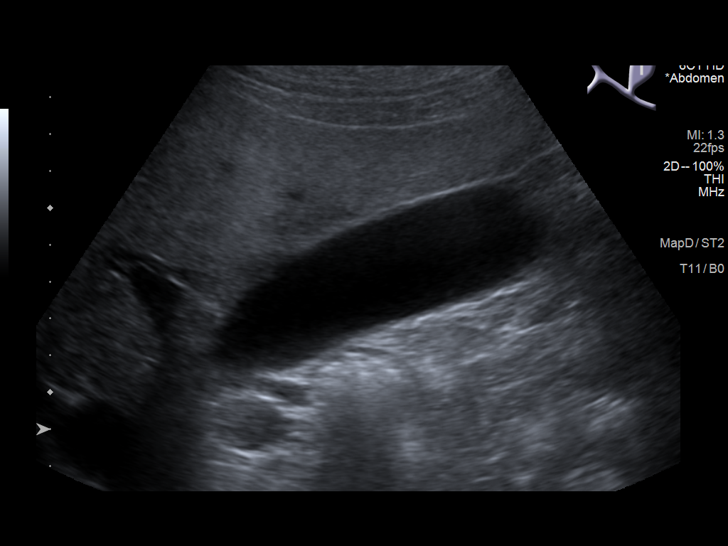
[im 10/29]
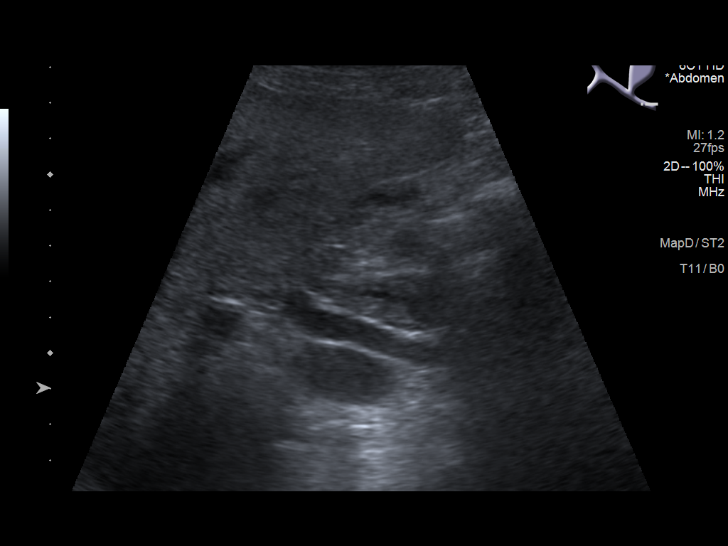
[im 11/29]
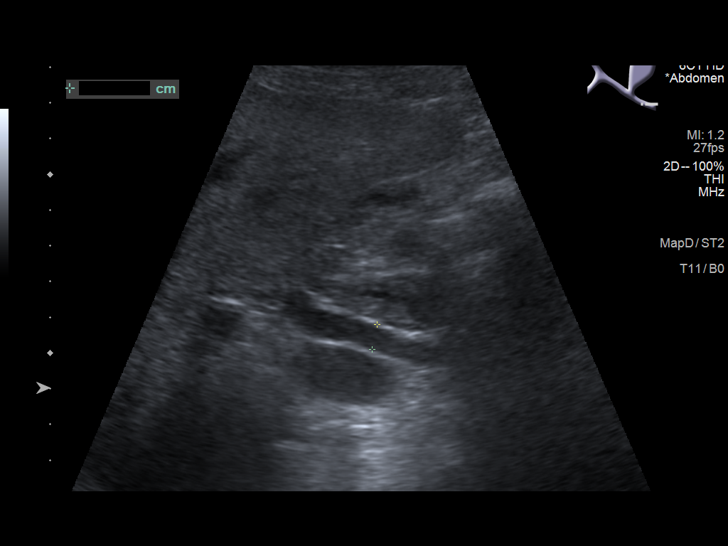
[im 13/29]
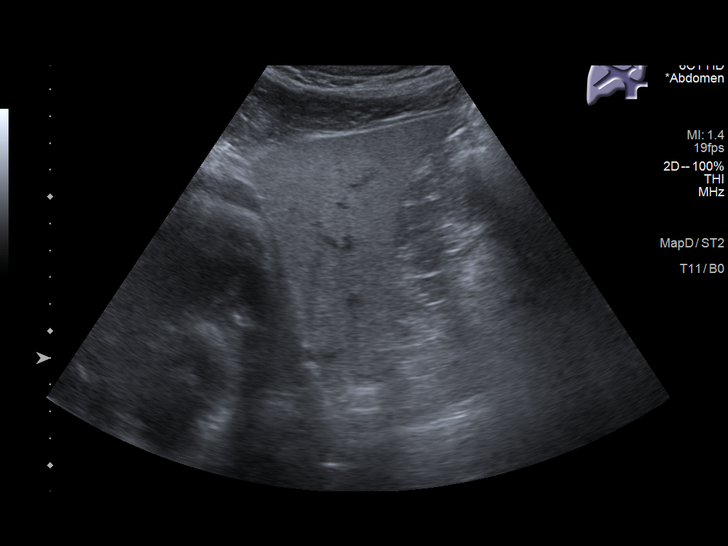
[im 16/29]
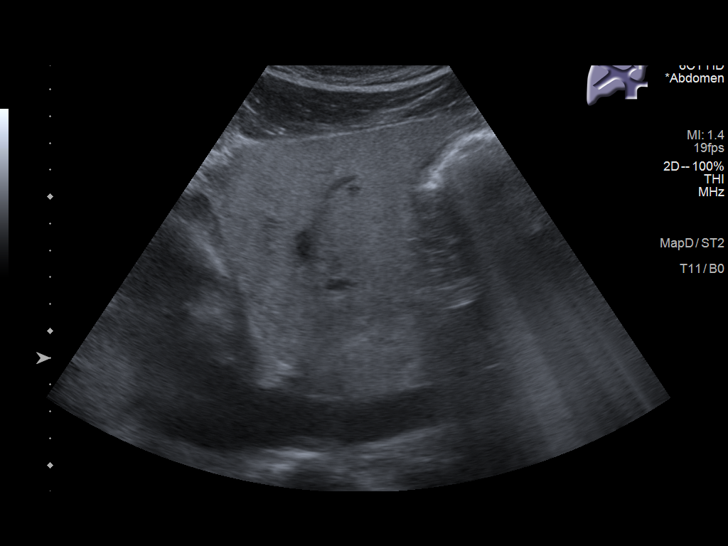
[im 18/29]
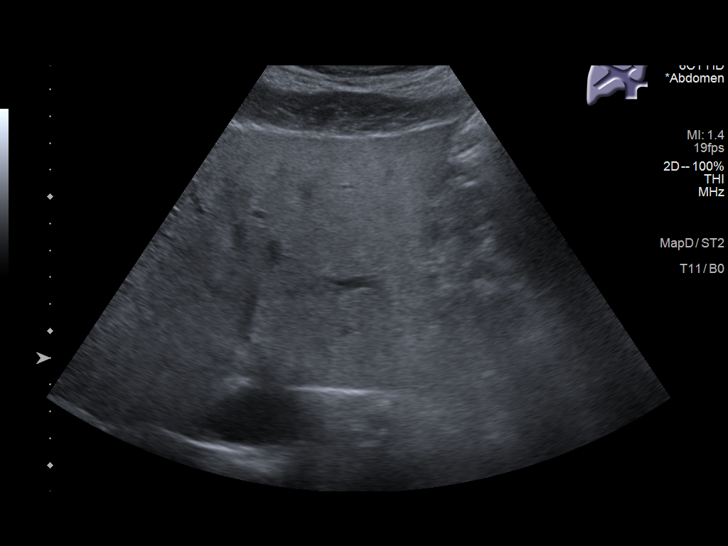
[im 19/29]
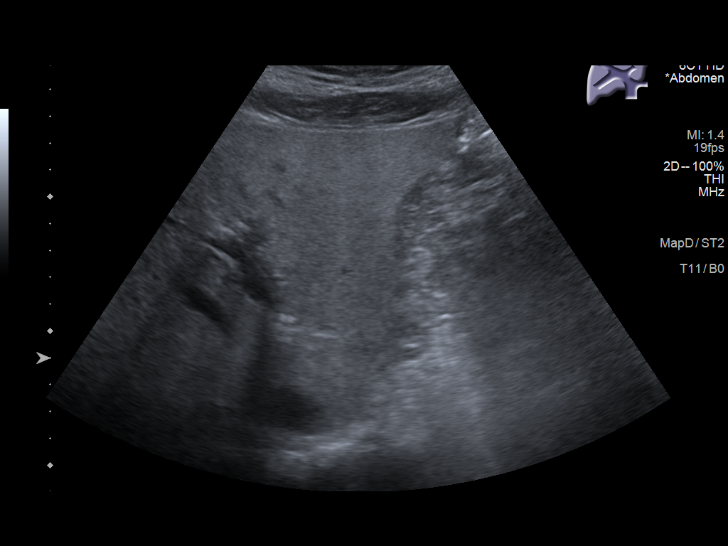
[im 22/29]
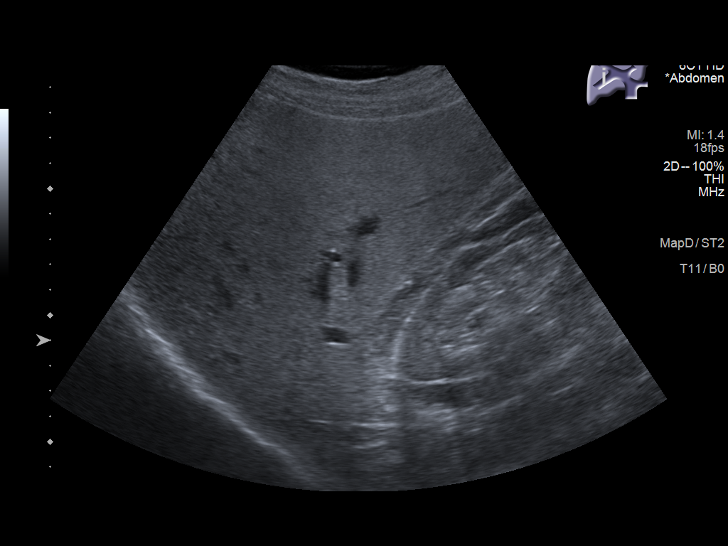
[im 24/29]
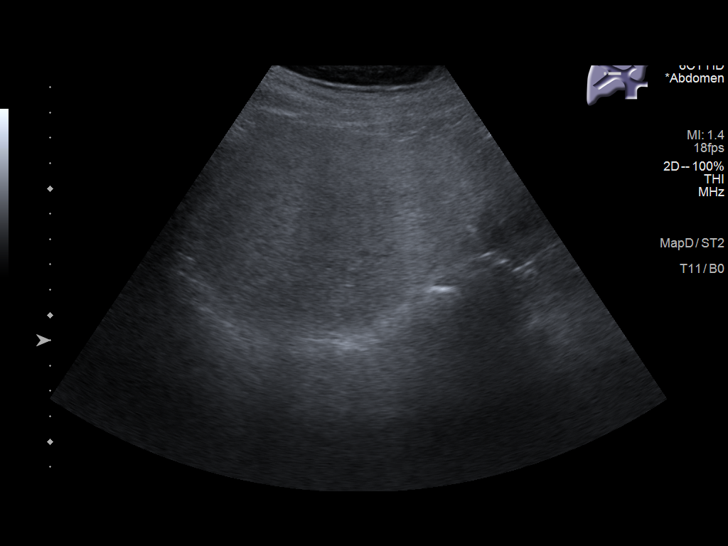
[im 26/29]
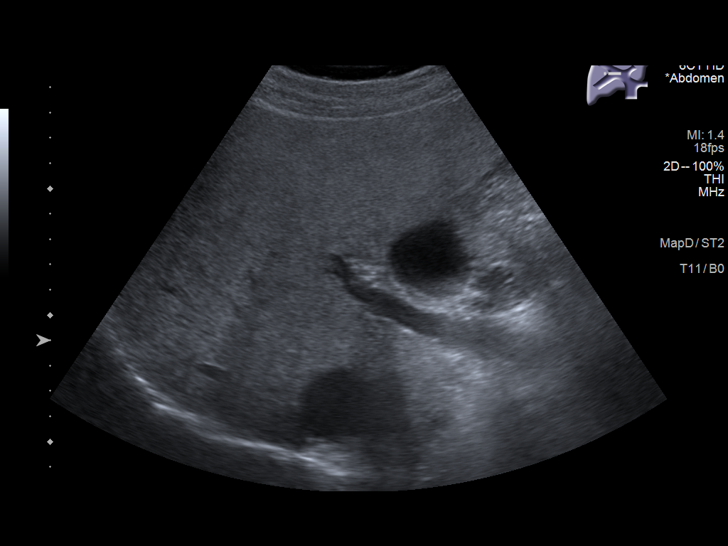
[im 29/29]
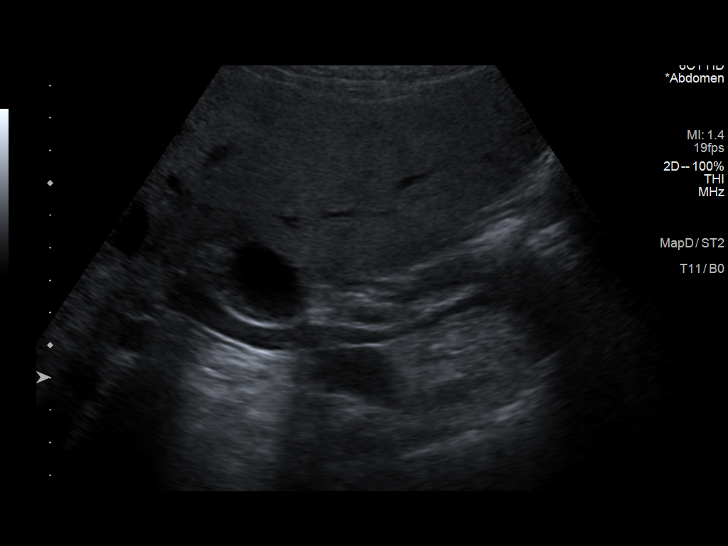

[14 of 25 positions shown; findings below may reference images not displayed]

FINDINGS: Gallbladder:

Physiologically distended. No gallstones or wall thickening
visualized. No sonographic Murphy sign noted by sonographer.

Common bile duct:

Diameter: 7 mm, mildly dilated. This of obstructing lesion or stone.

Liver:

Diffusely increased in parenchymal echogenicity. No discrete focal
lesion. No capsular nodularity. Portal vein is patent on color
Doppler imaging with normal direction of blood flow towards the
liver.

Other: No right upper quadrant ascites.
IMPRESSION: 1. Diffusely increased hepatic parenchymal echogenicity typical of
steatosis.
2. Mild dilatation of the common bile duct at 7 mm. In the setting
of elevated alkaline phosphatase, consider further assessment with
MRCP. No gallstones or abnormal gallbladder distension.

## 2022-04-02 ENCOUNTER — Other Ambulatory Visit: Payer: Self-pay | Admitting: Family Medicine

## 2022-04-02 ENCOUNTER — Telehealth: Payer: Medicare HMO

## 2022-04-05 ENCOUNTER — Ambulatory Visit: Payer: Medicare HMO | Admitting: Gastroenterology

## 2022-04-05 ENCOUNTER — Encounter: Payer: Self-pay | Admitting: Gastroenterology

## 2022-04-05 VITALS — BP 186/93 | HR 84 | Temp 98.3°F | Ht 71.0 in | Wt 222.0 lb

## 2022-04-05 DIAGNOSIS — E1129 Type 2 diabetes mellitus with other diabetic kidney complication: Secondary | ICD-10-CM | POA: Diagnosis not present

## 2022-04-05 DIAGNOSIS — E1169 Type 2 diabetes mellitus with other specified complication: Secondary | ICD-10-CM | POA: Diagnosis not present

## 2022-04-05 DIAGNOSIS — E785 Hyperlipidemia, unspecified: Secondary | ICD-10-CM | POA: Diagnosis not present

## 2022-04-05 DIAGNOSIS — E1159 Type 2 diabetes mellitus with other circulatory complications: Secondary | ICD-10-CM | POA: Diagnosis not present

## 2022-04-05 DIAGNOSIS — I152 Hypertension secondary to endocrine disorders: Secondary | ICD-10-CM | POA: Diagnosis not present

## 2022-04-05 DIAGNOSIS — N2581 Secondary hyperparathyroidism of renal origin: Secondary | ICD-10-CM | POA: Diagnosis not present

## 2022-04-05 DIAGNOSIS — K641 Second degree hemorrhoids: Secondary | ICD-10-CM | POA: Diagnosis not present

## 2022-04-05 DIAGNOSIS — Z794 Long term (current) use of insulin: Secondary | ICD-10-CM | POA: Diagnosis not present

## 2022-04-05 NOTE — Progress Notes (Signed)
PROCEDURE NOTE: The patient presents with symptomatic grade 2 hemorrhoids, unresponsive to maximal medical therapy, requesting rubber band ligation of his/her hemorrhoidal disease.  All risks, benefits and alternative forms of therapy were described and informed consent was obtained.  In the Left Lateral Decubitus position (if anoscopy is performed) anoscopic examination revealed grade 2 hemorrhoids in the LL position(s).   The decision was made to band the LL internal hemorrhoid, and the Berthold was used to perform band ligation without complication.  Digital anorectal examination was then performed to assure proper positioning of the band, and to adjust the banded tissue as required.  The patient was discharged home without pain or other issues.  Dietary and behavioral recommendations were given and (if necessary - prescriptions were given), along with follow-up instructions.  The patient will return 2 weeks for follow-up and possible additional banding as required.  No complications were encountered and the patient tolerated the procedure well.

## 2022-04-05 NOTE — Progress Notes (Signed)
Cephas Darby, MD [address]  [place]  [place], [place] [address]  Main: [phone]  Fax: [phone]    Gastroenterology Consultation  Referring Provider:     Birdie Sons, MD Primary Care Physician:  Birdie Sons, MD Primary Gastroenterologist:  Dr. Cephas Darby Reason for Consultation: Rectal pressure, swelling        HPI:   James Moreno is a 56 y.o. male referred by Dr. Birdie Sons, MD  for consultation & management of rectal pressure and swelling.  Patient reports that he has been experiencing several years history of rectal pressure, swelling, pain.  He tells me that his hemorrhoids are bothering and he is interested in hemorrhoid banding.  He used to be in Architect, was lifting heavy weights.  He is currently not working, involves prolonged sitting.  His diabetes is not under control, hemoglobin A1c is rising.  He reports that he has not been following diabetic diet since Thanksgiving.  He was started on Ozempic and he did not tolerate it due to nausea and vomiting.  Switched to [employer].  He denies any rectal bleeding, change in bowel habits.  He denies any constipation or diarrhea, weight loss.  His renal function is also worsening concerning for diabetic nephropathy.  Patient still is not interested to undergo colonoscopy  NSAIDs: None  Antiplts/Anticoagulants/Anti thrombotics: None  GI Procedures:  Upper endoscopy 01/21/2019  Patient undergoes Cologuard test for colon cancer screening EGD 01/21/2019 - Normal duodenal bulb and second portion of the duodenum. - Congested and inflamed mucosa in the prepyloric region of the stomach. Biopsied. - Normal gastric body, incisura and antrum. Biopsied. - Esophagogastric landmarks identified. - Normal gastroesophageal junction and esophagus. Biopsied.      He denies family history of GI malignancy  Past Medical History:  Diagnosis Date   Acute kidney failure, unspecified ([place])    Anxiety     Diabetes mellitus, type 2 (HCC)    GERD (gastroesophageal reflux disease)    History of arterial disease of lower extremity    History of hepatitis B    Hypertension    Hypothyroidism    Mitral valve disorders(424.0)    Pityriasis 12/27/2014   Primary pulmonary HTN ([place])    Pt denies ever having.   Tricuspid valve disorders, specified as nonrheumatic     Past Surgical History:  Procedure Laterality Date   APPENDECTOMY     Carotid Doppler Ultrasound  06/14/2009   39% stenosis of bilateral internal carotid artery, bilateral anterograde vertebral flow   ESOPHAGOGASTRODUODENOSCOPY (EGD) WITH PROPOFOL N/A 01/21/2019   Procedure: ESOPHAGOGASTRODUODENOSCOPY (EGD) WITH PROPOFOL;  Surgeon: Lin Landsman, MD;  Location: [facility];  Service: Endoscopy;  Laterality: N/A;  Diabetic - insulin   EYE SURGERY     right   HEMORROIDECTOMY     KIDNEY TRANSPLANT Right 2016   MECKEL DIVERTICULUM EXCISION     infancy   Myocardial Perfusion scan  01/17/2009   [facility], non- ischemic. LVEF= 55%   PARS PLANA VITRECTOMY Left 02/09/2015   Procedure: Pan retinal photocoagulation 36144;  Surgeon: Milus Height, MD;  Location: [facility];  Service: Ophthalmology;  Laterality: Left;   REFRACTIVE SURGERY Left    sleep study  01/09/2011   Severe sleep apnea. AHI 72.9/hr. RDI=83.0/hr. Desaturation to 69.0% Emergency CPAP titaration to 14.0cm (01/14/19 resolved after wt loss after kidney transplant.)     Current Outpatient Medications:    alprazolam (XANAX) 2 MG tablet, TAKE 1  TABLET BY MOUTH THREE TIMES DAILY AS NEEDED FOR ANXIETY, Disp: 90 tablet, Rfl: 0   amLODipine (NORVASC) 10 MG tablet, Take 10 mg by mouth daily. , Disp: , Rfl:    BD INSULIN SYRINGE U/F 31G X 5/16" 1 ML MISC, , Disp: , Rfl:    calcitRIOL (ROCALTROL) 0.25 MCG capsule, Take 1 capsule (0.25 mcg total) by mouth daily., Disp: 10 capsule, Rfl: 0   carvedilol (COREG) 12.5 MG tablet, Take 1.5 tablets (18.75 mg total) by  mouth 2 (two) times daily., Disp: 90 tablet, Rfl: 2   cinacalcet (SENSIPAR) 30 MG tablet, Take 1 tablet (30 mg total) by mouth daily., Disp: 10 tablet, Rfl: 0   dapagliflozin propanediol ([name]) 10 MG TABS tablet, Take 1 tablet (10 mg total) by mouth daily before breakfast. Patient receives through [name] through Dec 2023, Disp: 90 tablet, Rfl: 3   EDEX 40 MCG injection, 40 mcg by Intracavitary route as needed., Disp: , Rfl:    fenofibrate (TRICOR) 145 MG tablet, , Disp: , Rfl:    insulin aspart (NOVOLOG) 100 UNIT/ML injection, Inject 15 Units into the skin 3 (three) times daily before meals., Disp: , Rfl:    insulin degludec (TRESIBA FLEXTOUCH) 100 UNIT/ML FlexTouch Pen, Inject 45 Units into the skin daily., Disp: , Rfl:    losartan (COZAAR) 100 MG tablet, Take 1 tablet (100 mg total) by mouth at bedtime., Disp: 10 tablet, Rfl: 0   MOUNJARO 5 MG/0.5ML Pen, Inject 5 mg into the skin once a week., Disp: , Rfl:    mycophenolate (CELLCEPT) 500 MG tablet, Take 1,000 mg by mouth 2 (two) times daily., Disp: , Rfl:    naloxone (NARCAN) nasal spray 4 mg/0.1 mL, Place 1 spray into the nose once., Disp: , Rfl:    omeprazole (PRILOSEC) 20 MG capsule, Take 20 mg by mouth daily as needed., Disp: , Rfl:    oxyCODONE (OXY IR/ROXICODONE) 5 MG immediate release tablet, Take 1-2 tablets (5-10 mg total) by mouth every 6 (six) hours as needed for severe pain., Disp: 240 tablet, Rfl: 0   rosuvastatin (CRESTOR) 40 MG tablet, TAKE 1 TABLET EVERY DAY, Disp: 90 tablet, Rfl: 2   sildenafil (REVATIO) 20 MG tablet, , Disp: , Rfl:    tacrolimus (PROGRAF) 1 MG capsule, Take 3 mg by mouth 2 (two) times daily. , Disp: , Rfl: 11   tamsulosin (FLOMAX) 0.4 MG CAPS capsule, Take 1 capsule (0.4 mg total) by mouth daily., Disp: 30 capsule, Rfl: 3   tretinoin (RETIN-A) 0.05 % cream, Apply topically as needed. , Disp: , Rfl:    triamcinolone cream (KENALOG) 0.1 %, APPLY DAILY TO INFLAMED BUMPS AS NEEDED, Disp: , Rfl:    XYOSTED 76  MG/0.5ML SOAJ, , Disp: , Rfl:    Family History  Problem Relation Age of Onset   Hypertension Mother    Hyperlipidemia Mother    Melanoma Father      Social History   Tobacco Use   Smoking status: Every Day    Packs/day: 1.00    Years: 41.00    Total pack years: 41.00    Types: Cigarettes   Smokeless tobacco: Never   Tobacco comments:    since age 89.  Vaping Use   Vaping Use: Former  Substance Use Topics   Alcohol use: Yes    Alcohol/week: 0.0 - 1.0 standard drinks of alcohol    Comment: Excessive alcohol consumption in the past. Quit around 2016. Drinks occasionally.   Drug use: No  Allergies as of 04/05/2022 - Review Complete 04/05/2022  Allergen Reaction Noted   No known allergies  05/21/2018    Review of Systems:    All systems reviewed and negative except where noted in HPI.   Physical Exam:  BP (!) 186/93 (BP Location: Left Arm, Patient Position: Sitting, Cuff Size: Normal)   Pulse 84   Temp 98.3 F (36.8 C) (Oral)   Ht '5\' 11"'$  (1.803 m)   Wt 222 lb (100.7 kg)   BMI 30.96 kg/m  No LMP for male patient.  General:   Alert,  Well-developed, well-nourished, pleasant and cooperative in NAD Head:  Normocephalic and atraumatic. Eyes:  Sclera clear, no icterus.   Conjunctiva pink. Ears:  Normal auditory acuity. Nose:  No deformity, discharge, or lesions. Mouth:  No deformity or lesions,oropharynx pink & moist. Neck:  Supple; no masses or thyromegaly. Lungs:  Respirations even and unlabored.  Clear throughout to auscultation.   No wheezes, crackles, or rhonchi. No acute distress. Heart:  Regular rate and rhythm; no murmurs, clicks, rubs, or gallops. Abdomen:  Normal bowel sounds. Soft, non-tender and non-distended without masses, hepatosplenomegaly or hernias noted.  No guarding or rebound tenderness.   Rectal: Small perianal skin tag.  Mildly prolapsed left lateral hemorrhoid.  Nontender digital rectal exam.  Anoscopy revealed both internal and external  hemorrhoids and brown mucousy stool in the rectal vault Msk:  Symmetrical without gross deformities. Good, equal movement & strength bilaterally. Pulses:  Normal pulses noted. Extremities:  No clubbing or edema.  No cyanosis. Neurologic:  Alert and oriented x3;  grossly normal neurologically. Skin:  Intact without significant lesions or rashes. No jaundice. Psych:  Alert and cooperative. Normal mood and affect.  Imaging Studies: Reviewed  Assessment and Plan:   James Moreno is a 57 y.o. male with history of metabolic syndrome, diabetes poorly controlled, status post renal transplant on immunosuppression, worsening renal function, chronic GERD is seen in consultation for several years history of rectal pressure, swelling and discomfort, rectal pain.  His symptoms are most likely secondary to external hemorrhoids given the duration of his symptoms.  Discussed about outpatient hemorrhoid banding, including risks and benefits of the procedure.  Consent obtained, proceed with hemorrhoid ligation today  I also encouraged patient to undergo colonoscopy for colon cancer screening and he says that he will think about it   Follow up in 2 weeks   Cephas Darby, MD

## 2022-04-09 ENCOUNTER — Telehealth: Payer: Self-pay

## 2022-04-09 NOTE — Telephone Encounter (Signed)
Copied from [place] [id number]. Topic: General - Inquiry >> Apr 09, 2022  3:33 PM Ludger Nutting wrote: Patient stated he received a letter that [name] is no longer in network for him. Patient wants to know who will be taking over his medication management. Please advise patient.

## 2022-04-10 ENCOUNTER — Other Ambulatory Visit: Payer: Self-pay

## 2022-04-10 DIAGNOSIS — Z794 Long term (current) use of insulin: Secondary | ICD-10-CM

## 2022-04-10 DIAGNOSIS — N1832 Chronic kidney disease, stage 3b: Secondary | ICD-10-CM

## 2022-04-10 DIAGNOSIS — I10 Essential (primary) hypertension: Secondary | ICD-10-CM

## 2022-04-10 DIAGNOSIS — E782 Mixed hyperlipidemia: Secondary | ICD-10-CM

## 2022-04-10 NOTE — Telephone Encounter (Signed)
Can we please get [name] rescheduled with me, he has the appropriate referral now.   [name], PharmD, Para March, [name]  Clinical Pharmacist Practitioner  [facility] [phone]

## 2022-04-10 NOTE — Telephone Encounter (Signed)
Can you place a referral using [id number] for him? That will allow me to continue seeing him.

## 2022-04-10 NOTE — Telephone Encounter (Signed)
Referral placed.

## 2022-04-11 ENCOUNTER — Telehealth: Payer: Self-pay

## 2022-04-11 NOTE — Progress Notes (Signed)
Care Coordination Pharmacy Assistant   Name: James Moreno  MRN: [id number] DOB: 1965-06-30  Reason for Encounter: Reschedule appointment/Medication Refill Request  Patient requested that his Sensipar 30 mg tablet be refilled. I send the request to [name].   [name] advised that:  Cinacalcet is likely not a medication he should be taking. Felecia McCray tried multiple times to reach out to [facility] but never heard back from them. can you please let him know  I am going to plan to discontinue the medication from his medication list and he is welcome to discuss that medicine with Dr. Holley Raring at his next follow-up.   Message left for the patient regarding the update, and informed that any additional questions he can also discuss with [name] on Friday .  Medications: Outpatient Encounter Medications as of 04/11/2022  Medication Sig Note   alprazolam (XANAX) 2 MG tablet TAKE 1 TABLET BY MOUTH THREE TIMES DAILY AS NEEDED FOR ANXIETY    amLODipine (NORVASC) 10 MG tablet Take 10 mg by mouth daily.  03/01/2022: Taking 10 mg in morning   BD INSULIN SYRINGE U/F 31G X 5/16" 1 ML MISC     calcitRIOL (ROCALTROL) 0.25 MCG capsule Take 1 capsule (0.25 mcg total) by mouth daily.    carvedilol (COREG) 12.5 MG tablet Take 1.5 tablets (18.75 mg total) by mouth 2 (two) times daily. 03/01/2022: Taking 18.'75mg'$  twice a day.   cinacalcet (SENSIPAR) 30 MG tablet Take 1 tablet (30 mg total) by mouth daily. 03/01/2022: Medication has not been filled. Will contact provider and pharmacy today   dapagliflozin propanediol ([name]) 10 MG TABS tablet Take 1 tablet (10 mg total) by mouth daily before breakfast. Patient receives through [name] through Dec 2023    EDEX 40 MCG injection 40 mcg by Intracavitary route as needed. 03/01/2022: Reports not taking   fenofibrate (TRICOR) 145 MG tablet  03/01/2022: Patient reports not taking   insulin aspart (NOVOLOG) 100 UNIT/ML injection Inject 15 Units into the skin 3 (three) times daily  before meals. 03/01/2022: Reports receiving via [name]. Reports receiving assistance application via mail. Will submit   insulin degludec (TRESIBA FLEXTOUCH) 100 UNIT/ML FlexTouch Pen Inject 45 Units into the skin daily. 03/01/2022: Reports taking 40 units   losartan (COZAAR) 100 MG tablet Take 1 tablet (100 mg total) by mouth at bedtime.    MOUNJARO 5 MG/0.5ML Pen Inject 5 mg into the skin once a week.    mycophenolate (CELLCEPT) 500 MG tablet Take 1,000 mg by mouth 2 (two) times daily. 03/01/2022: Reports taking taking 2 tabs in morning and 2 tabs at night   naloxone (NARCAN) nasal spray 4 mg/0.1 mL Place 1 spray into the nose once. 03/01/2022: Reports having available. Has not needed to use   omeprazole (PRILOSEC) 20 MG capsule Take 20 mg by mouth daily as needed. 03/01/2022: Reports taking as needed   oxyCODONE (OXY IR/ROXICODONE) 5 MG immediate release tablet Take 1-2 tablets (5-10 mg total) by mouth every 6 (six) hours as needed for severe pain.    rosuvastatin (CRESTOR) 40 MG tablet TAKE 1 TABLET EVERY DAY    sildenafil (REVATIO) 20 MG tablet  03/01/2022: Reports not taking   tacrolimus (PROGRAF) 1 MG capsule Take 3 mg by mouth 2 (two) times daily.  03/01/2022: Reports taking two in the morning and 2 at night   tamsulosin (FLOMAX) 0.4 MG CAPS capsule Take 1 capsule (0.4 mg total) by mouth daily.    tretinoin (RETIN-A) 0.05 % cream Apply topically  as needed.     triamcinolone cream (KENALOG) 0.1 % APPLY DAILY TO INFLAMED BUMPS AS NEEDED    XYOSTED 50 MG/0.5ML SOAJ  03/01/2022: Reports not taking   No facility-administered encounter medications on file as of 04/11/2022.    Lynann Bologna, CPA/CMA Clinical Pharmacist Assistant Phone: [phone]

## 2022-04-13 ENCOUNTER — Telehealth: Payer: Self-pay

## 2022-04-13 NOTE — Telephone Encounter (Signed)
Care Management & Coordination Services Outreach Note  04/13/2022 Name: GRADEN HOSHINO MRN: [id number] DOB: [birth year]  Referred by: Birdie Sons, MD  Patient had a phone appointment scheduled with clinical pharmacist today.  An unsuccessful telephone outreach was attempted today. The patient was referred to the pharmacist for assistance with medications, care management and care coordination.   Patient will NOT be penalized in any way for missing a Care Management & Coordination Services appointment. The no-show fee does not apply.  [name], PharmD, [place], [name]  Clinical Pharmacist Practitioner  [facility] [phone]

## 2022-04-13 NOTE — Progress Notes (Unsigned)
Care Management & Coordination Services Pharmacy Note  04/13/2022 Name:  James Moreno MRN:  [id number] DOB:  07-22-1965  Summary: ***  Recommendations/Changes made from today's visit: ***  Follow up plan: ***   Subjective: James Moreno is an 57 y.o. year old male who is a primary patient of Fisher, Kirstie Peri, MD.  The care coordination team was consulted for assistance with disease management and care coordination needs.    {CCMTELEPHONEFACETOFACE:21091510} for {CCMINITIALFOLLOWUPCHOICE:21091511}.  Recent office visits: 02/12/22: Patient presented to Dr. Caryn Section for follow-up. Tamsulosin.   Recent consult visits: 04/05/22: Patient presented to Dr. Honor Junes (endocrinology)  02/26/22: Patient presented to Dr. Holley Raring (nephrology)  01/30/22: Patient presented to Dr. Rockey Situ (cardiology) for follow-up. Carvedilol 18.75 mg twice daily.  11/28/21: Patient presented to Dr. Honor Junes (endocrinology). Ozempic 1 mg weekly.  10/25/21: Patient presented to Dr. Holley Raring (nephrology)   Hospital visits: None in previous 6 months   Objective:  Lab Results  Component Value Date   CREATININE 2.81 (H) 02/12/2022   BUN 36 (H) 02/12/2022   EGFR 26 (L) 02/12/2022   GFRNONAA 39 (L) 09/17/2019   GFRAA 45 (L) 09/17/2019   NA 130 (L) 02/12/2022   K 3.9 02/12/2022   CALCIUM 9.8 02/12/2022   CO2 20 02/12/2022   GLUCOSE 165 (H) 02/12/2022    Lab Results  Component Value Date/Time   HGBA1C 6.9 11/28/2021 12:00 AM   HGBA1C 8.1 02/28/2021 12:00 AM   MICROALBUR 100 12/31/2016 02:13 PM    Last diabetic Eye exam:  Lab Results  Component Value Date/Time   HMDIABEYEEXA Retinopathy (A) 06/14/2021 12:00 AM    Last diabetic Foot exam: No results found for: "HMDIABFOOTEX"   Lab Results  Component Value Date   CHOL 141 06/20/2020   HDL 46 06/20/2020   LDLCALC 53 06/20/2020   TRIG 268 (H) 06/20/2020   CHOLHDL 3.1 06/20/2020       Latest Ref Rng & Units 02/12/2022    2:14 PM 06/20/2020     9:03 AM 09/17/2019    2:17 PM  Hepatic Function  Total Protein 6.0 - 8.5 g/dL   6.5   Albumin 3.8 - 4.9 g/dL 3.9  4.2  4.3   AST 0 - 40 IU/L   9   ALT 0 - 44 IU/L   9   Alk Phosphatase 48 - 121 IU/L   131   Total Bilirubin 0.0 - 1.2 mg/dL   0.6     Lab Results  Component Value Date/Time   TSH 1.34 12/12/2018 12:00 AM   TSH 1.72 06/13/2006 12:00 AM       Latest Ref Rng & Units 12/12/2018   12:00 AM 10/21/2015    1:10 PM 06/21/2013    2:29 PM  CBC  WBC  6.7     8.9  9.7   Hemoglobin 13.5 - 17.5 14.8     16.3  10.1   Hematocrit 41 - 53 43     46.7  28.9   Platelets 150 - 399 182     140  175      This result is from an external source.    No results found for: "VD25OH", "VITAMINB12"  Clinical ASCVD: {YES/NO:21197} The 10-year ASCVD risk score (Arnett DK, et al., 2019) is: 33.1%   Values used to calculate the score:     Age: 35 years     Sex: Male     Is Non-Hispanic African American: No     Diabetic: Yes  Tobacco smoker: Yes     Systolic Blood Pressure: 324 mmHg     Is BP treated: Yes     HDL Cholesterol: 46 mg/dL     Total Cholesterol: 141 mg/dL    ***Other: (CHADS2VASc if Afib, MMRC or CAT for COPD, ACT, DEXA)     03/01/2022    1:00 PM 02/28/2022   10:56 AM 02/12/2022    1:35 PM  Depression screen PHQ 2/9  Decreased Interest 0 0 0  Down, Depressed, Hopeless 1 0 0  PHQ - 2 Score 1 0 0  Altered sleeping  0 0  Tired, decreased energy  1 1  Change in appetite  0 0  Feeling bad or failure about yourself   0 0  Trouble concentrating  0 0  Moving slowly or fidgety/restless  0 0  Suicidal thoughts  0 0  PHQ-9 Score  1 1  Difficult doing work/chores  Not difficult at all Not difficult at all     Social History   Tobacco Use  Smoking Status Every Day   Packs/day: 1.00   Years: 41.00   Total pack years: 41.00   Types: Cigarettes  Smokeless Tobacco Never  Tobacco Comments   since age 40.   BP Readings from Last 3 Encounters:  04/05/22 (!) 186/93   02/12/22 138/70  01/30/22 (!) 150/60   Pulse Readings from Last 3 Encounters:  04/05/22 84  02/12/22 86  01/30/22 95   Wt Readings from Last 3 Encounters:  04/05/22 222 lb (100.7 kg)  02/28/22 216 lb (98 kg)  01/30/22 216 lb (98 kg)   BMI Readings from Last 3 Encounters:  04/05/22 30.96 kg/m  02/28/22 30.13 kg/m  01/30/22 30.13 kg/m    Allergies  Allergen Reactions   No Known Allergies     Medications Reviewed Today     Reviewed by Shelby Mattocks, CMA (Certified Medical Assistant) on 04/05/22 at [place] List Status: <None>   Medication Order Taking? Sig Documenting Provider Last Dose Status Informant  alprazolam (XANAX) 2 MG tablet [id number] Yes TAKE 1 TABLET BY MOUTH THREE TIMES DAILY AS NEEDED FOR ANXIETY Birdie Sons, MD Taking Active   amLODipine (NORVASC) 10 MG tablet [id number] Yes Take 10 mg by mouth daily.  [provider] Taking Active            Med Note Minerva Ends, FELECIA N   Thu Mar 01, 2022 12:51 PM) Taking 10 mg in morning  BD INSULIN SYRINGE U/F 31G X 5/16" 1 ML MISC [id number] Yes  [provider] Taking Active   calcitRIOL (ROCALTROL) 0.25 MCG capsule [id number] Yes Take 1 capsule (0.25 mcg total) by mouth daily. Birdie Sons, MD Taking Active   carvedilol (COREG) 12.5 MG tablet [id number] Yes Take 1.5 tablets (18.75 mg total) by mouth 2 (two) times daily. Minna Merritts, MD Taking Active            Med Note Minerva Ends, [facility] N   Thu Mar 01, 2022 12:50 PM) Taking 18.'75mg'$  twice a day.  cinacalcet (SENSIPAR) 30 MG tablet [id number] Yes Take 1 tablet (30 mg total) by mouth daily. Birdie Sons, MD Taking Active            Med Note [facility], [facility] N   Thu Mar 01, 2022  1:21 PM) Medication has not been filled. Will contact provider and pharmacy today  dapagliflozin propanediol ([name]) 10 MG TABS tablet [id number] Yes Take 1 tablet (10 mg total) by mouth  daily before breakfast. Patient receives through [name] through Dec 2023 Birdie Sons, MD Taking Active   EDEX 40 MCG injection [id number] Yes 40 mcg by Intracavitary route as needed. [provider] Taking Active            Med Note Minerva Ends, [facility] N   Thu Mar 01, 2022  1:09 PM) Reports not taking  fenofibrate (TRICOR) 145 MG tablet [id number] Yes  [provider] Taking Active            Med Note Minerva Ends, FELECIA N   Thu Mar 01, 2022  1:03 PM) Patient reports not taking  insulin aspart (NOVOLOG) 100 UNIT/ML injection [id number] Yes Inject 15 Units into the skin 3 (three) times daily before meals. [provider] Taking Active Self           Med Note Minerva Ends, [facility] N   Thu Mar 01, 2022 12:42 PM) Reports receiving via [name]. Reports receiving assistance application via mail. Will submit  insulin degludec (TRESIBA FLEXTOUCH) 100 UNIT/ML FlexTouch Pen [id number] Yes Inject 45 Units into the skin daily. [provider] Taking Active            Med Note Minerva Ends, [facility] N   Thu Mar 01, 2022  1:06 PM) Reports taking 40 units  losartan (COZAAR) 100 MG tablet [id number] Yes Take 1 tablet (100 mg total) by mouth at bedtime. Birdie Sons, MD Taking Active   lovastatin (MEVACOR) 40 MG tablet [id number] Yes Take 40 mg by mouth at bedtime. [provider] Taking Active   MOUNJARO 5 MG/0.5ML Pen [id number] Yes Inject 5 mg into the skin once a week. [provider] Taking Active   mycophenolate (CELLCEPT) 500 MG tablet [id number] Yes Take 1,000 mg by mouth 2 (two) times daily. [provider] Taking Active            Med Note Minerva Ends, [facility] N   Thu Mar 01, 2022 12:52 PM) Reports taking taking 2 tabs in morning and 2 tabs at night  naloxone [facility]) nasal spray 4 mg/0.1 mL [id number] Yes Place 1 spray into the nose once. [provider] Taking Active            Med Note Minerva Ends, [facility]IA N   Thu Mar 01, 2022  1:15 PM) Reports having available. Has not needed to use  omeprazole (PRILOSEC) 20 MG capsule [id number]  Yes Take 20 mg by mouth daily as needed. [provider] Taking Active            Med Note Minerva Ends, Lore[place] Mar 01, 2022 12:53 PM) Reports taking as needed  oxyCODONE (OXY IR/ROXICODONE) 5 MG immediate release tablet [id number] Yes Take 1-2 tablets (5-10 mg total) by mouth every 6 (six) hours as needed for severe pain. Birdie Sons, MD Taking Active   rosuvastatin (CRESTOR) 40 MG tablet [id number] Yes TAKE 1 TABLET EVERY DAY Gollan, Kathlene November, MD Taking Active   sildenafil (REVATIO) 20 MG tablet [id number] Yes  [provider] Taking Active            Med Note Minerva Ends, [facility]IA N   Thu Mar 01, 2022  1:06 PM) Reports not taking  tacrolimus (PROGRAF) 1 MG capsule [id number] Yes Take 3 mg by mouth 2 (two) times daily.  [provider] Taking Active Self           Med Note Basilio Cairo Mar 01, 2022 12:53 PM) Reports taking  two in the morning and 2 at night  tamsulosin (FLOMAX) 0.4 MG CAPS capsule [id number] Yes Take 1 capsule (0.4 mg total) by mouth daily. Birdie Sons, MD Taking Active   tretinoin (RETIN-A) 0.05 % cream [id number] Yes Apply topically as needed.  [provider] Taking Active   triamcinolone cream (KENALOG) 0.1 % [id number] Yes APPLY DAILY TO INFLAMED BUMPS AS NEEDED [provider] Taking Active   XYOSTED 50 MG/0.5ML Darden Palmer [id number] Yes  [provider] Taking Active            Med Note Minerva Ends, FELECIA N   Thu Mar 01, 2022  1:06 PM) Reports not taking            SDOH:  (Social Determinants of Health) assessments and interventions performed: {yes/no:20286} SDOH Interventions    Flowsheet Row Chronic Care Management from 03/01/2022 in [place] from 02/28/2022 in [place] Office Visit from 02/12/2022 in [place] Management from 06/13/2021 in [place]  Management from 05/19/2021 in [place] from 02/27/2021 in [place] Interventions        Food Insecurity Interventions Intervention Not Indicated Intervention Not Indicated -- -- -- Intervention Not Indicated  Housing Interventions Intervention Not Indicated Intervention Not Indicated -- -- -- Intervention Not Indicated  Transportation Interventions Intervention Not Indicated Intervention Not Indicated -- -- -- Intervention Not Indicated  Utilities Interventions Intervention Not Indicated Intervention Not Indicated -- -- -- --  Alcohol Usage Interventions Intervention Not Indicated (Score <7) Intervention Not Indicated (Score <7) -- -- -- --  Depression Interventions/Treatment  PHQ2-9 Score <4 Follow-up Not Indicated -- PHQ2-9 Score <4 Follow-up Not Indicated -- -- --  Financial Strain Interventions Intervention Not Indicated Intervention Not Indicated -- Intervention Not Indicated Other (Comment)  [PAP] Intervention Not Indicated  Physical Activity Interventions Other (Comments)  [Ability to exercise limited d/t chronic diseases. Reports decline in activity tolerance] Patient Refused -- -- -- Intervention Not Indicated  Stress Interventions Other (Comment)  [Reports d/t declining health] Intervention Not Indicated -- -- -- Intervention Not Indicated  Social Connections Interventions Intervention Not Indicated Intervention Not Indicated -- -- -- Intervention Not Indicated      SDOH Screenings   Food Insecurity: No Food Insecurity (03/01/2022)  Housing: Low Risk  (03/01/2022)  Transportation Needs: No Transportation Needs (03/01/2022)  Utilities: Not At Risk (03/01/2022)  Alcohol Screen: Low Risk  (02/28/2022)  Depression (PHQ2-9): Low Risk  (03/01/2022)  Financial Resource Strain: Medium Risk (03/01/2022)  Physical Activity: Inactive (03/01/2022)  Social Connections: Moderately Integrated (03/01/2022)  Recent Concern:  Social Connections - Moderately Isolated (02/28/2022)  Stress: Stress Concern Present (03/01/2022)  Tobacco Use: High Risk (04/05/2022)    Medication Assistance: {MEDASSISTANCEINFO:25044}  Medication Access: Within the past 30 days, how often has patient missed a dose of medication? *** Is a pillbox or other method used to improve adherence? {YES/NO:21197} Factors that may affect medication adherence? {CHL DESC; BARRIERS:21522} Are meds synced by current pharmacy? {YES/NO:21197} Are meds delivered by current pharmacy? {YES/NO:21197} Does patient experience delays in picking up medications due to transportation concerns? {YES/NO:21197}  [facility] Reviewed: Is patient disadvantaged to use [facility]?: {YES/NO:21197} Current Rx insurance plan: *** Name and location of Current pharmacy:  [facility] [address] [place] - [place] [address] Phone: [phone] Fax: [phone]  [facility] ([facility] Bundle Only) - [place], [address] [address] Phone: [phone] Fax: [place], [place] [address] [address] Phone: [phone] Fax: [phone]  [facility] - [place] [address] Phone: [phone] Fax: [phone]  [facility] services reviewed with patient today?: {YES/NO:21197} Patient requests to transfer care to [facility]?: {YES/NO:21197} Reason patient declined to change pharmacies: {US patient preference:27474}  Compliance/Adherence/Medication fill history: Care Gaps: ***  Star-Rating Drugs: ***   Assessment/Plan  Hypertension (BP goal <130/80) -Controlled -Current treatment: Amlodipine 10 mg daily Carvedilol 12.5 mg twice daily   Losartan 100 mg daily   -Medications previously tried: NA   -Current home readings: 140/78 -Denies hypotensive/hypertensive symptoms -Recommended to continue current medication  Hyperlipidemia: (LDL goal < 70) -History of PAD, CAD  -Controlled -Current treatment: Fenofibrate 145 mg daily  Rosuvastatin 40 mg daily  -Medications previously tried: NA  -Educated on Importance of limiting foods high in cholesterol; -STOP Rosuvastatin -STOP Fenofibrate (entry error)  -START Atorvastatin 80 mg daily   Diabetes (A1c goal <7%) -Uncontrolled -Managed by Dr. Honor Junes -Current medications: [name] 10 mg daily: Appropriate, Query effective Novolog 15 units three times daily + 2 units for every 50 units above 150: Appropriate, Query effective  Mounjaro 2.5 mg weekly  Tresiba 45 units daily (0.45 u/kg): Appropriate, Query effective -Medications previously tried: Lantus (Formulary)  -Current home glucose readings  Target 9/12-9/18 11/25-12/8 1/6-1/19  Number of days worn ? 14 days '14 14 14  '$ % of time active ? 70% 85% 88% 73%  Mean Glucose (mg/dL)  171 225 176  GMI  7.4% 8.7% 7.5%  Glycemic Variability (%CV) ?36% 31.4% 46.1% 45.2%  Time above >250 mg/dL <5% 8% 35% 18%  Time above 70-180 mg/dL <25% 35% 21% 23%  Time in range: 70-180 mg/dL >70% 55% 44% 56%  Time below 70 mg/dL <4% 2% 0% 3%  Time below 54 mg/dL <1%  0% 0%  -Dietary Patterns: Dramatic changes to diet, cut out sodas, cut out fried foods, and has cut out late night snacking.    Anxiety (Goal: Maintain stable mood and sleep) -Controlled -Current treatment: Alprazolam 2 mg three times daily as needed - Sleep  -Medications previously tried/failed: NA -PHQ9: 0 -GAD7: 5 -  Renal Transplant  (Goal: prevent rejection of kidney ) -Managed by Dr. Holley Raring  -Controlled -Current treatment  Mycophenolate 500 mg 2 tablets twice daily  Tacrolimus 1 mg 3 capsules twice daily  -Medications previously tried: Myfortic (cost) -Recommended to continue current medication  Tobacco use (Goal  Quit smoking) -Uncontrolled -Previous quit attempts: Chantix (nightmares), nicotine gum (ulcers), nicotine patch (stickiness) -Current treatment  17-18 cigarettes daily  -Patient smokes Within 30 minutes of waking -Patient triggers include: stress and finishing a meal -Continue working to cut down on cigarette use.   Chronic Kidney Disease Stage 3a  -All medications assessed for renal dosing and appropriateness in chronic kidney disease. -Recommended to continue current medication   ***

## 2022-04-20 ENCOUNTER — Other Ambulatory Visit: Payer: Self-pay | Admitting: Family Medicine

## 2022-04-20 ENCOUNTER — Telehealth: Payer: Self-pay

## 2022-04-20 DIAGNOSIS — R52 Pain, unspecified: Secondary | ICD-10-CM

## 2022-04-20 DIAGNOSIS — L905 Scar conditions and fibrosis of skin: Secondary | ICD-10-CM

## 2022-04-20 DIAGNOSIS — E1140 Type 2 diabetes mellitus with diabetic neuropathy, unspecified: Secondary | ICD-10-CM

## 2022-04-20 DIAGNOSIS — M79604 Pain in right leg: Secondary | ICD-10-CM

## 2022-04-20 MED ORDER — DAPAGLIFLOZIN PROPANEDIOL 10 MG PO TABS: 10.0000 mg | ORAL_TABLET | Freq: Every day | ORAL | 3 refills | Status: DC

## 2022-04-20 NOTE — Telephone Encounter (Signed)
Requested medication (s) are due for refill today: yes  Requested medication (s) are on the active medication list: yes  Last refill:  03/24/22 #240  Future visit scheduled: no  Notes to clinic:  med not delegated to NT to RF   Requested Prescriptions  Pending Prescriptions Disp Refills   oxyCODONE (OXY IR/ROXICODONE) 5 MG immediate release tablet 240 tablet 0    Sig: Take 1-2 tablets (5-10 mg total) by mouth every 6 (six) hours as needed for severe pain.     Not Delegated - Analgesics:  Opioid Agonists Failed - 04/20/2022 12:14 PM      Failed - This refill cannot be delegated      Failed - Urine Drug Screen completed in last 360 days      Passed - Valid encounter within last 3 months    Recent Outpatient Visits           2 months ago Urinary hesitancy   [place] Birdie Sons, MD   10 months ago Primary hypertension   [place], Donald E, MD   1 year ago Type 2 diabetes mellitus with diabetic nephropathy, without long-term current use of insulin ([place])   [place], Donald E, MD   1 year ago Type 2 diabetes mellitus with diabetic nephropathy, without long-term current use of insulin ([place])   [place] Birdie Sons, MD   2 years ago Type 2 diabetes mellitus with diabetic nephropathy, with long-term current use of insulin ([place])   [place] [facility] Caryn Section, Kirstie Peri, MD

## 2022-04-20 NOTE — Telephone Encounter (Signed)
Medication Refill - Medication: oxyCODONE (OXY IR/ROXICODONE) 5 MG immediate release tablet   Has the patient contacted their pharmacy? Yes.   (Agent: If no, request that the patient contact the pharmacy for the refill. If patient does not wish to contact the pharmacy document the reason why and proceed with request.) (Agent: If yes, when and what did the pharmacy advise?)  Preferred Pharmacy (with phone number or street name):  [place], [place] - [place]  [address] [place] [address]  Phone: [phone] Fax: [phone]   Has the patient been seen for an appointment in the last year OR does the patient have an upcoming appointment? Yes.    Agent: Please be advised that RX refills may take up to 3 business days. We ask that you follow-up with your pharmacy.

## 2022-04-22 MED ORDER — OXYCODONE HCL 5 MG PO TABS: 5.0000 mg | ORAL_TABLET | Freq: Four times a day (QID) | ORAL | 0 refills | Status: DC | PRN

## 2022-04-27 ENCOUNTER — Ambulatory Visit: Payer: Medicare HMO

## 2022-04-27 DIAGNOSIS — F17200 Nicotine dependence, unspecified, uncomplicated: Secondary | ICD-10-CM

## 2022-04-27 DIAGNOSIS — E782 Mixed hyperlipidemia: Secondary | ICD-10-CM

## 2022-04-27 DIAGNOSIS — Z794 Long term (current) use of insulin: Secondary | ICD-10-CM

## 2022-04-27 MED ORDER — ATORVASTATIN CALCIUM 80 MG PO TABS: 80.0000 mg | ORAL_TABLET | Freq: Every day | ORAL | 1 refills | Status: DC

## 2022-04-27 NOTE — Progress Notes (Signed)
Care Management & Coordination Services Pharmacy Note  04/27/2022 Name:  James Moreno MRN:  [id number] DOB:  May 10, 1965  Summary: Patient presents for follow-up consult.   -Patient continues to struggle with high variability in blood sugar due to inconsistent eating habits. We reviewed rule of 15 for hypoglycemia. He does not feel Darcel Bayley is working well for him but is only on 5 mg weekly. He has been taking this dose for 4-5 weeks and denies any GI side effects so far.   -Patient was unwilling to discuss smoking cessation during today's visit.   Recommendations/Changes made from today's visit: -Will discuss with Dr. Honor Junes about increasing Mounjaro to 7.5 mg weekly.  -DECREASE Tresiba slightly to 43 units daily to minimize nocturnal hypoglycemia. -STOP Rosuvastatin -STOP Fenofibrate (entry error)  -START Atorvastatin 80 mg daily    Follow up plan: [name] follow-up 1 month   Subjective: James Moreno is an 57 y.o. year old male who is a primary patient of Fisher, Kirstie Peri, MD.  The care coordination team was consulted for assistance with disease management and care coordination needs.    Engaged with patient by telephone for follow up visit.  Recent office visits: 02/12/22: Patient presented to Dr. Caryn Section for follow-up. Tamsulosin.   Recent consult visits: 04/05/22: Patient presented to Dr. Honor Junes (endocrinology)  02/26/22: Patient presented to Dr. Holley Raring (nephrology)  01/30/22: Patient presented to Dr. Rockey Situ (cardiology) for follow-up. Carvedilol 18.75 mg twice daily.  11/28/21: Patient presented to Dr. Honor Junes (endocrinology). Ozempic 1 mg weekly.  10/25/21: Patient presented to Dr. Holley Raring (nephrology)   Hospital visits: None in previous 6 months   Objective:  Lab Results  Component Value Date   CREATININE 2.81 (H) 02/12/2022   BUN 36 (H) 02/12/2022   EGFR 26 (L) 02/12/2022   GFRNONAA 39 (L) 09/17/2019   GFRAA 45 (L) 09/17/2019   NA 130 (L) 02/12/2022   K  3.9 02/12/2022   CALCIUM 9.8 02/12/2022   CO2 20 02/12/2022   GLUCOSE 165 (H) 02/12/2022    Lab Results  Component Value Date/Time   HGBA1C 6.9 11/28/2021 12:00 AM   HGBA1C 8.1 02/28/2021 12:00 AM   MICROALBUR 100 12/31/2016 02:13 PM    Last diabetic Eye exam:  Lab Results  Component Value Date/Time   HMDIABEYEEXA Retinopathy (A) 06/14/2021 12:00 AM    Last diabetic Foot exam: No results found for: "HMDIABFOOTEX"   Lab Results  Component Value Date   CHOL 141 06/20/2020   HDL 46 06/20/2020   LDLCALC 53 06/20/2020   TRIG 268 (H) 06/20/2020   CHOLHDL 3.1 06/20/2020       Latest Ref Rng & Units 02/12/2022    2:14 PM 06/20/2020    9:03 AM 09/17/2019    2:17 PM  Hepatic Function  Total Protein 6.0 - 8.5 g/dL   6.5   Albumin 3.8 - 4.9 g/dL 3.9  4.2  4.3   AST 0 - 40 IU/L   9   ALT 0 - 44 IU/L   9   Alk Phosphatase 48 - 121 IU/L   131   Total Bilirubin 0.0 - 1.2 mg/dL   0.6     Lab Results  Component Value Date/Time   TSH 1.34 12/12/2018 12:00 AM   TSH 1.72 06/13/2006 12:00 AM       Latest Ref Rng & Units 12/12/2018   12:00 AM 10/21/2015    1:10 PM 06/21/2013    2:29 PM  CBC  WBC  6.7  8.9  9.7   Hemoglobin 13.5 - 17.5 14.8     16.3  10.1   Hematocrit 41 - 53 43     46.7  28.9   Platelets 150 - 399 182     140  175      This result is from an external source.    No results found for: "VD25OH", "VITAMINB12"  Clinical ASCVD: Yes  The 10-year ASCVD risk score (Arnett DK, et al., 2019) is: 33.1%   Values used to calculate the score:     Age: 57 years     Sex: Male     Is Non-Hispanic African American: No     Diabetic: Yes     Tobacco smoker: Yes     Systolic Blood Pressure: 341 mmHg     Is BP treated: Yes     HDL Cholesterol: 46 mg/dL     Total Cholesterol: 141 mg/dL       03/01/2022    1:00 PM 02/28/2022   10:56 AM 02/12/2022    1:35 PM  Depression screen PHQ 2/9  Decreased Interest 0 0 0  Down, Depressed, Hopeless 1 0 0  PHQ - 2 Score 1 0 0   Altered sleeping  0 0  Tired, decreased energy  1 1  Change in appetite  0 0  Feeling bad or failure about yourself   0 0  Trouble concentrating  0 0  Moving slowly or fidgety/restless  0 0  Suicidal thoughts  0 0  PHQ-9 Score  1 1  Difficult doing work/chores  Not difficult at all Not difficult at all     Social History   Tobacco Use  Smoking Status Every Day   Packs/day: 1.00   Years: 41.00   Total pack years: 41.00   Types: Cigarettes  Smokeless Tobacco Never  Tobacco Comments   since age 57.   BP Readings from Last 3 Encounters:  04/05/22 (!) 186/93  02/12/22 138/70  01/30/22 (!) 150/60   Pulse Readings from Last 3 Encounters:  04/05/22 84  02/12/22 86  01/30/22 95   Wt Readings from Last 3 Encounters:  04/05/22 222 lb (100.7 kg)  02/28/22 216 lb (98 kg)  01/30/22 216 lb (98 kg)   BMI Readings from Last 3 Encounters:  04/05/22 30.96 kg/m  02/28/22 30.13 kg/m  01/30/22 30.13 kg/m    Allergies  Allergen Reactions   No Known Allergies     Medications Reviewed Today     Reviewed by Shelby Mattocks, CMA (Certified Medical Assistant) on 04/05/22 at [place] List Status: <None>   Medication Order Taking? Sig Documenting Provider Last Dose Status Informant  alprazolam (XANAX) 2 MG tablet [id number] Yes TAKE 1 TABLET BY MOUTH THREE TIMES DAILY AS NEEDED FOR ANXIETY Birdie Sons, MD Taking Active   amLODipine (NORVASC) 10 MG tablet [id number] Yes Take 10 mg by mouth daily.  [provider] Taking Active            Med Note Minerva Ends, FELECIA N   Thu Mar 01, 2022 12:51 PM) Taking 10 mg in morning  BD INSULIN SYRINGE U/F 31G X 5/16" 1 ML MISC [id number] Yes  [provider] Taking Active   calcitRIOL (ROCALTROL) 0.25 MCG capsule [id number] Yes Take 1 capsule (0.25 mcg total) by mouth daily. Birdie Sons, MD Taking Active   carvedilol (COREG) 12.5 MG tablet [id number] Yes Take 1.5 tablets (18.75 mg total) by mouth 2 (two) times daily. Rockey Situ,  Kathlene November, MD Taking Active            Med Note Minerva Ends, [facility] N   Thu Mar 01, 2022 12:50 PM) Taking 18.'75mg'$  twice a day.  cinacalcet (SENSIPAR) 30 MG tablet [id number] Yes Take 1 tablet (30 mg total) by mouth daily. Birdie Sons, MD Taking Active            Med Note [facility], [facility] N   Thu Mar 01, 2022  1:21 PM) Medication has not been filled. Will contact provider and pharmacy today  dapagliflozin propanediol ([name]) 10 MG TABS tablet [id number] Yes Take 1 tablet (10 mg total) by mouth daily before breakfast. Patient receives through [name] through Dec 2023 Birdie Sons, MD Taking Active   EDEX 40 MCG injection [id number] Yes 40 mcg by Intracavitary route as needed. [provider] Taking Active            Med Note Minerva Ends, [facility] N   Thu Mar 01, 2022  1:09 PM) Reports not taking  fenofibrate (TRICOR) 145 MG tablet [id number] Yes  [provider] Taking Active            Med Note Minerva Ends, FELECIA N   Thu Mar 01, 2022  1:03 PM) Patient reports not taking  insulin aspart (NOVOLOG) 100 UNIT/ML injection [id number] Yes Inject 15 Units into the skin 3 (three) times daily before meals. [provider] Taking Active Self           Med Note Minerva Ends, [facility] N   Thu Mar 01, 2022 12:42 PM) Reports receiving via [name]. Reports receiving assistance application via mail. Will submit  insulin degludec (TRESIBA FLEXTOUCH) 100 UNIT/ML FlexTouch Pen [id number] Yes Inject 45 Units into the skin daily. [provider] Taking Active            Med Note Minerva Ends, [facility] N   Thu Mar 01, 2022  1:06 PM) Reports taking 40 units  losartan (COZAAR) 100 MG tablet [id number] Yes Take 1 tablet (100 mg total) by mouth at bedtime. Birdie Sons, MD Taking Active   lovastatin (MEVACOR) 40 MG tablet [id number] Yes Take 40 mg by mouth at bedtime. [provider] Taking Active   MOUNJARO 5 MG/0.5ML Pen [id number] Yes Inject 5 mg into the skin once a week. [provider] Taking Active   mycophenolate (CELLCEPT) 500 MG tablet [id number] Yes Take 1,000 mg by mouth 2 (two) times daily. [provider] Taking Active            Med Note Minerva Ends, [facility] N   Thu Mar 01, 2022 12:52 PM) Reports taking taking 2 tabs in morning and 2 tabs at night  naloxone [facility]) nasal spray 4 mg/0.1 mL [id number] Yes Place 1 spray into the nose once. [provider] Taking Active            Med Note Minerva Ends, [facility]IA N   Thu Mar 01, 2022  1:15 PM) Reports having available. Has not needed to use  omeprazole (PRILOSEC) 20 MG capsule [id number] Yes Take 20 mg by mouth daily as needed. [provider] Taking Active            Med Note Minerva Ends, Lorelee New Mar 01, 2022 12:53 PM) Reports taking as needed  oxyCODONE (OXY IR/ROXICODONE) 5 MG immediate release tablet [id number] Yes Take 1-2 tablets (5-10 mg total) by mouth every 6 (six) hours as needed for severe pain. Birdie Sons, MD Taking Active  rosuvastatin (CRESTOR) 40 MG tablet [id number] Yes TAKE 1 TABLET EVERY DAY Gollan, Kathlene November, MD Taking Active   sildenafil (REVATIO) 20 MG tablet [id number] Yes  [provider] Taking Active            Med Note Minerva Ends, [facility] N   Thu Mar 01, 2022  1:06 PM) Reports not taking  tacrolimus (PROGRAF) 1 MG capsule [id number] Yes Take 3 mg by mouth 2 (two) times daily.  [provider] Taking Active Self           Med Note Minerva Ends, [facility] N   Thu Mar 01, 2022 12:53 PM) Reports taking two in the morning and 2 at night  tamsulosin (FLOMAX) 0.4 MG CAPS capsule [id number] Yes Take 1 capsule (0.4 mg total) by mouth daily. Birdie Sons, MD Taking Active   tretinoin (RETIN-A) 0.05 % cream [id number] Yes Apply topically as needed.  [provider] Taking Active   triamcinolone cream (KENALOG) 0.1 % [id number] Yes APPLY DAILY TO INFLAMED BUMPS AS NEEDED [provider] Taking Active   XYOSTED 50 MG/0.5ML Darden Palmer [id number] Yes  [provider] Taking Active            Med Note Minerva Ends, FELECIA N   Thu Mar 01, 2022  1:06 PM) Reports not taking            SDOH:  (Social Determinants of Health) assessments and interventions performed: Yes SDOH Interventions    Flowsheet Row Chronic Care Management from 03/01/2022 in [place] from 02/28/2022 in [place] Office Visit from 02/12/2022 in [place] Management from 06/13/2021 in [place] Management from 05/19/2021 in [place] from 02/27/2021 in [place] Interventions        Food Insecurity Interventions Intervention Not Indicated Intervention Not Indicated -- -- -- Intervention Not Indicated  Housing Interventions Intervention Not Indicated Intervention Not Indicated -- -- -- Intervention Not Indicated  Transportation Interventions Intervention Not Indicated Intervention Not Indicated -- -- -- Intervention Not Indicated  Utilities Interventions Intervention Not Indicated Intervention Not Indicated -- -- -- --  Alcohol Usage Interventions Intervention Not Indicated (Score <7) Intervention Not Indicated (Score <7) -- -- -- --  Depression Interventions/Treatment  PHQ2-9 Score <4 Follow-up Not Indicated -- PHQ2-9 Score <4 Follow-up Not Indicated -- -- --  Financial Strain Interventions Intervention Not Indicated Intervention Not Indicated -- Intervention Not Indicated Other (Comment)  [PAP] Intervention Not Indicated  Physical Activity Interventions Other (Comments)  [Ability to exercise limited d/t chronic diseases. Reports decline in activity tolerance] Patient Refused -- -- -- Intervention Not Indicated  Stress Interventions Other (Comment)  [Reports d/t declining health] Intervention Not Indicated -- -- -- Intervention Not Indicated  Social Connections  Interventions Intervention Not Indicated Intervention Not Indicated -- -- -- Intervention Not Indicated      SDOH Screenings   Food Insecurity: No Food Insecurity (03/01/2022)  Housing: Low Risk  (03/01/2022)  Transportation Needs: No Transportation Needs (03/01/2022)  Utilities: Not At Risk (03/01/2022)  Alcohol Screen: Low Risk  (02/28/2022)  Depression (PHQ2-9): Low Risk  (03/01/2022)  Financial Resource Strain: Medium Risk (03/01/2022)  Physical Activity: Inactive (03/01/2022)  Social Connections: Moderately Integrated (03/01/2022)  Recent Concern: Social Connections - Moderately Isolated (02/28/2022)  Stress: Stress Concern Present (03/01/2022)  Tobacco Use: High Risk (04/05/2022)    Medication Assistance:  Tyler Aas and Novolog obtained through [name] medication assistance program.  Enrollment ends Dec 2023   Wilder Glade obtained through [name] medication assistance program.  Enrollment ends Dec 2023  Medication Access: Within the past 30 days, how often has patient missed a dose of medication? No Is a pillbox or other method used to improve adherence? Yes  Factors that may affect medication adherence? no barriers identified Are meds synced by current pharmacy? No  Are meds delivered by current pharmacy? No  Does patient experience delays in picking up medications due to transportation concerns? No   [facility] Services Reviewed: Is patient disadvantaged to use [facility]?: Yes  Current Rx insurance plan: [employer] Name and location of Current pharmacy:  [facility] [address] [place] [address] Phone: [phone] Fax: [phone]  [facility] ([facility] Only) - [place], [address] [place] [address] Phone: [phone] Fax: [place], [place] [address] [place] [address] Phone: [phone] Fax:  [phone]  [facility] - [place], [place] [address] Phone: [phone] Fax: [phone]  Reason patient declined to change pharmacies: Disadvantaged due to insurance/mail order  Compliance/Adherence/Medication fill history: Care Gaps: HIV  Shingrix  Tdap  Covid   Star-Rating Drugs: Rosuvastatin 40 mg last filled on 01/31/22 for a 90-Day supply via [place] Losartan 100 mg last filled on 01/31/22 for a 90-Day supply via [place]  Assessment/Plan  Hypertension (BP goal <130/80) -Controlled -Current treatment: Amlodipine 10 mg daily Carvedilol 12.5 mg twice daily   Losartan 100 mg daily   -Medications previously tried: NA  -Current home readings: 140/78 -Denies hypotensive/hypertensive symptoms -Recommended to continue current medication  Hyperlipidemia: (LDL goal < 70) -History of PAD, CAD  -Controlled -Current treatment: Fenofibrate 145 mg daily  Rosuvastatin 40 mg daily  -Medications previously tried: NA  -Educated on Importance of limiting foods high in cholesterol; -STOP Rosuvastatin -STOP Fenofibrate (entry error)  -START Atorvastatin 80 mg daily   Diabetes (A1c goal <7%) -Uncontrolled -Managed by Dr. Honor Junes -Current medications: [name] 10 mg daily: Appropriate, Query effective Novolog 15 units three times daily + 2 units for every 50 units above 150: Appropriate, Query effective  Mounjaro 5 mg weekly  Tresiba 45 units daily (0.45 u/kg): Appropriate, Query effective -Medications previously tried: Lantus (Formulary)  -Current home glucose readings  Target 11/25-12/8 1/6-1/19 1/20-2/2  Number of days worn ? 14 days '14 14 14  '$ % of time active ? 70% 88% 73% 74%  Mean Glucose (mg/dL)  225 176 189  GMI  8.7% 7.5% 7.8%  Glycemic Variability (%CV) ?36% 46.1% 45.2% 47.2%  Time above >250 mg/dL <5% 35% 18% 22%  Time above 70-180 mg/dL <25% 21% 23% 29%  Time in  range: 70-180 mg/dL >70% 44% 56% 44%  Time below 70 mg/dL <4% 0% 3% 5%  Time below 54 mg/dL <1% 0% 0% 0%  -Dietary Patterns: Dramatic changes to diet, cut out sodas, cut out fried foods, and has cut out late night snacking.  -Patient continues to struggle with high variability in blood sugar due to inconsistent eating habits. We reviewed rule of 15 for hypoglycemia. He does not feel Darcel Bayley is working well for him but is only on 5 mg weekly. He has been taking this dose for 4-5 weeks and denies any GI side  effects so far.  -Will discuss with Dr. Honor Junes about increasing Mounjaro to 7.5 mg weekly.  -DECREASE Tresiba slightly to 43 units daily to minimize nocturnal hypoglycemia.    Anxiety (Goal: Maintain stable mood and sleep) -Controlled -Current treatment: Alprazolam 2 mg three times daily as needed - Sleep  -Medications previously tried/failed: NA -PHQ9: 0 -GAD7: 5 -Continue current medications   Renal Transplant  (Goal: prevent rejection of kidney ) -Managed by Dr. Holley Raring  -Controlled -Current treatment  Mycophenolate 500 mg 2 tablets twice daily  Tacrolimus 1 mg 3 capsules twice daily  -Medications previously tried: Myfortic (cost) -Recommended to continue current medication  Tobacco use (Goal Quit smoking) -Uncontrolled -Previous quit attempts: Chantix (nightmares), nicotine gum (ulcers), nicotine patch (stickiness) -Current treatment  17-18 cigarettes daily  -Patient smokes Within 30 minutes of waking -Patient triggers include: stress and finishing a meal -Patient was unwilling to discuss smoking cessation during today's visit.  -Continue working to cut down on cigarette use.   Chronic Kidney Disease Stage 3a  -All medications assessed for renal dosing and appropriateness in chronic kidney disease. -Recommended to continue current medication  [name], PharmD, Para [place], [name]  Clinical Pharmacist Practitioner  [facility] [phone]

## 2022-05-01 ENCOUNTER — Other Ambulatory Visit: Payer: Self-pay | Admitting: Family Medicine

## 2022-05-01 ENCOUNTER — Telehealth: Payer: Self-pay

## 2022-05-01 NOTE — Progress Notes (Signed)
Care Coordination Pharmacy Assistant   Name: James Moreno  MRN: [id number] DOB: 10-07-1965  Reason for Encounter: 2024 Renewal Patient Assistance  I received a task from [name], [name] requesting I contact Dr. Sherren Mocha office to inform them that the patient has been taking Mounjaro 5 mg weekly and wanted to know if he could increase his dose to 7.5 mg weekly.  I contacted the patient prior to contacting providers office as I wanted to make sure the patient did not call to already make this request.   After speaking with the patient he advised that he did not call Dr. Sherren Mocha office as he is unable to keep up with the expense of the [facility] and would need to start back on the Ozempic 0.5 mg weekly.  Patient currently receives Ozempic via [employer] patient assistance program, but has not received a notification that he was approved for the 2024 year. I informed the patient that I would call Novo to check status of his application but there is typically a long wait time with Novo so I would not be getting back with him immediately. Patient did advise he has a few weeks left of the Ozempic.  I contacted [name] and spoke with representative who stated that she does not have the [id number] renewal application for the patient. After speaking with [name] he doesn't have patients renewal application, and after speaking with patient he doesn't remember turning it in.  Application has been sent to [name], printed and given to the front desk. Patient stated he will be coming to the office tomorrow to complete. Medications: Outpatient Encounter Medications as of 05/01/2022  Medication Sig Note   alprazolam (XANAX) 2 MG tablet TAKE 1 TABLET BY MOUTH THREE TIMES DAILY AS NEEDED FOR ANXIETY    amLODipine (NORVASC) 10 MG tablet Take 10 mg by mouth daily.  03/01/2022: Taking 10 mg in morning   atorvastatin (LIPITOR) 80 MG tablet Take 1 tablet (80 mg total) by mouth daily.    BD INSULIN SYRINGE U/F  31G X 5/16" 1 ML MISC     calcitRIOL (ROCALTROL) 0.25 MCG capsule Take 1 capsule (0.25 mcg total) by mouth daily.    carvedilol (COREG) 12.5 MG tablet Take 1.5 tablets (18.75 mg total) by mouth 2 (two) times daily. 03/01/2022: Taking 18.2m twice a day.   dapagliflozin propanediol ([name]) 10 MG TABS tablet Take 1 tablet (10 mg total) by mouth daily before breakfast. Patient receives through [name] through Dec 2023    EDEX 40 MCG injection 40 mcg by Intracavitary route as needed. 03/01/2022: Reports not taking   insulin aspart (NOVOLOG) 100 UNIT/ML injection Inject 15 Units into the skin 3 (three) times daily before meals. 03/01/2022: Reports receiving via [employer] Reports receiving assistance application via mail. Will submit   insulin degludec (TRESIBA FLEXTOUCH) 100 UNIT/ML FlexTouch Pen Inject 43 Units into the skin daily.    losartan (COZAAR) 100 MG tablet Take 1 tablet (100 mg total) by mouth at bedtime.    MOUNJARO 5 MG/0.5ML Pen Inject 5 mg into the skin once a week.    mycophenolate (CELLCEPT) 500 MG tablet Take 1,000 mg by mouth 2 (two) times daily. 03/01/2022: Reports taking taking 2 tabs in morning and 2 tabs at night   naloxone (NARCAN) nasal spray 4 mg/0.1 mL Place 1 spray into the nose once. 03/01/2022: Reports having available. Has not needed to use   omeprazole (PRILOSEC) 20 MG capsule Take 20 mg by mouth daily as  needed. 03/01/2022: Reports taking as needed   oxyCODONE (OXY IR/ROXICODONE) 5 MG immediate release tablet Take 1-2 tablets (5-10 mg total) by mouth every 6 (six) hours as needed for severe pain.    sildenafil (REVATIO) 20 MG tablet  03/01/2022: Reports not taking   tacrolimus (PROGRAF) 1 MG capsule Take 3 mg by mouth 2 (two) times daily.  03/01/2022: Reports taking two in the morning and 2 at night   tamsulosin (FLOMAX) 0.4 MG CAPS capsule Take 1 capsule (0.4 mg total) by mouth daily.    tretinoin (RETIN-A) 0.05 % cream Apply topically as needed.     triamcinolone cream  (KENALOG) 0.1 % APPLY DAILY TO INFLAMED BUMPS AS NEEDED    XYOSTED 50 MG/0.5ML SOAJ  03/01/2022: Reports not taking   No facility-administered encounter medications on file as of 05/01/2022.    Lynann Bologna, CPA/CMA Clinical Pharmacist Assistant Phone: [phone]

## 2022-05-18 ENCOUNTER — Other Ambulatory Visit: Payer: Self-pay | Admitting: Family Medicine

## 2022-05-18 DIAGNOSIS — M79604 Pain in right leg: Secondary | ICD-10-CM

## 2022-05-18 DIAGNOSIS — L905 Scar conditions and fibrosis of skin: Secondary | ICD-10-CM

## 2022-05-18 NOTE — Telephone Encounter (Signed)
Requested medication (s) are due for refill today: yes  Requested medication (s) are on the active medication list: yes  Last refill:  04/22/22 #240  Future visit scheduled: no  Notes to clinic:  med not delegated to NT to RF   Requested Prescriptions  Pending Prescriptions Disp Refills   oxyCODONE (OXY IR/ROXICODONE) 5 MG immediate release tablet 240 tablet 0    Sig: Take 1-2 tablets (5-10 mg total) by mouth every 6 (six) hours as needed for severe pain.     Not Delegated - Analgesics:  Opioid Agonists Failed - 05/18/2022 12:42 PM      Failed - This refill cannot be delegated      Failed - Urine Drug Screen completed in last 360 days      Passed - Valid encounter within last 3 months    Recent Outpatient Visits           3 months ago Urinary hesitancy   [place] Birdie Sons, MD   11 months ago Primary hypertension   [place], Donald E, MD   1 year ago Type 2 diabetes mellitus with diabetic nephropathy, without long-term current use of insulin ([place])   [place] Birdie Sons, MD   1 year ago Type 2 diabetes mellitus with diabetic nephropathy, without long-term current use of insulin ([place])   [place] Birdie Sons, MD   2 years ago Type 2 diabetes mellitus with diabetic nephropathy, with long-term current use of insulin ([place])   [place] [facility] Caryn Section, Kirstie Peri, MD

## 2022-05-18 NOTE — Telephone Encounter (Signed)
Medication Refill - Medication: oxyCODONE (OXY IR/ROXICODONE) 5 MG immediate release tablet   Has the patient contacted their pharmacy? No.  Preferred Pharmacy (with phone number or street name):  [facility], [place] Phone: [phone]  Fax: [phone]     Has the patient been seen for an appointment in the last year OR does the patient have an upcoming appointment? Yes.    Agent: Please be advised that RX refills may take up to 3 business days. We ask that you follow-up with your pharmacy.

## 2022-05-21 MED ORDER — OXYCODONE HCL 5 MG PO TABS: 5.0000 mg | ORAL_TABLET | Freq: Four times a day (QID) | ORAL | 0 refills | Status: DC | PRN

## 2022-05-22 ENCOUNTER — Other Ambulatory Visit: Payer: Self-pay | Admitting: Gastroenterology

## 2022-05-23 DIAGNOSIS — E1121 Type 2 diabetes mellitus with diabetic nephropathy: Secondary | ICD-10-CM | POA: Diagnosis not present

## 2022-05-23 NOTE — Telephone Encounter (Signed)
Last office visit 04/05/2022  In office visit plan did not say if needed to continue the medication.  Last refill omeprazole '20mg'$  historical medication

## 2022-05-23 NOTE — Telephone Encounter (Signed)
Called and left a message for call back  

## 2022-05-28 ENCOUNTER — Ambulatory Visit: Payer: Medicare HMO

## 2022-05-28 DIAGNOSIS — Z794 Long term (current) use of insulin: Secondary | ICD-10-CM

## 2022-05-28 DIAGNOSIS — I10 Essential (primary) hypertension: Secondary | ICD-10-CM

## 2022-05-28 MED ORDER — OZEMPIC (0.25 OR 0.5 MG/DOSE) 2 MG/3ML ~~LOC~~ SOPN: 0.5000 mg | PEN_INJECTOR | SUBCUTANEOUS | Status: DC

## 2022-05-28 NOTE — Progress Notes (Signed)
Care Management & Coordination Services Pharmacy Note  05/28/2022 Name:  James Moreno MRN:  [id number] DOB:  Jul 23, 1965  Summary: Patient presents for follow-up consult.   Patient continues to struggle with high variability in blood sugar due to inconsistent eating habits. We reviewed rule of 15 for hypoglycemia. He does not feel Darcel Bayley is working well for him and has gone back to using an old supply of Ozempic. He is struggling with nausea, but thinks it helps him with weight loss and appetite control better than Mounjaro.    Recommendations/Changes made from today's visit: -STOP Mounjaro  -START Ozempic 0.5 mg weekly   -PAP is still in progress for patient, mailed out to patient home today.   Follow up plan: [name] follow-up 1 month   Subjective: James Moreno is an 57 y.o. year old male who is a primary patient of Fisher, Kirstie Peri, MD.  The care coordination team was consulted for assistance with disease management and care coordination needs.    Engaged with patient by telephone for follow up visit.  Recent office visits: 02/12/22: Patient presented to Dr. Caryn Section for follow-up. Tamsulosin.   Recent consult visits: 04/05/22: Patient presented to Dr. Honor Junes (endocrinology)  02/26/22: Patient presented to Dr. Holley Raring (nephrology)  01/30/22: Patient presented to Dr. Rockey Situ (cardiology) for follow-up. Carvedilol 18.75 mg twice daily.  11/28/21: Patient presented to Dr. Honor Junes (endocrinology). Ozempic 1 mg weekly.  10/25/21: Patient presented to Dr. Holley Raring (nephrology)   Hospital visits: None in previous 6 months   Objective:  Lab Results  Component Value Date   CREATININE 2.81 (H) 02/12/2022   BUN 36 (H) 02/12/2022   EGFR 26 (L) 02/12/2022   GFRNONAA 39 (L) 09/17/2019   GFRAA 45 (L) 09/17/2019   NA 130 (L) 02/12/2022   K 3.9 02/12/2022   CALCIUM 9.8 02/12/2022   CO2 20 02/12/2022   GLUCOSE 165 (H) 02/12/2022    Lab Results  Component Value Date/Time   HGBA1C  6.9 11/28/2021 12:00 AM   HGBA1C 8.1 02/28/2021 12:00 AM   MICROALBUR 100 12/31/2016 02:13 PM    Last diabetic Eye exam:  Lab Results  Component Value Date/Time   HMDIABEYEEXA Retinopathy (A) 06/14/2021 12:00 AM    Last diabetic Foot exam: No results found for: "HMDIABFOOTEX"   Lab Results  Component Value Date   CHOL 141 06/20/2020   HDL 46 06/20/2020   LDLCALC 53 06/20/2020   TRIG 268 (H) 06/20/2020   CHOLHDL 3.1 06/20/2020       Latest Ref Rng & Units 02/12/2022    2:14 PM 06/20/2020    9:03 AM 09/17/2019    2:17 PM  Hepatic Function  Total Protein 6.0 - 8.5 g/dL   6.5   Albumin 3.8 - 4.9 g/dL 3.9  4.2  4.3   AST 0 - 40 IU/L   9   ALT 0 - 44 IU/L   9   Alk Phosphatase 48 - 121 IU/L   131   Total Bilirubin 0.0 - 1.2 mg/dL   0.6     Lab Results  Component Value Date/Time   TSH 1.34 12/12/2018 12:00 AM   TSH 1.72 06/13/2006 12:00 AM       Latest Ref Rng & Units 12/12/2018   12:00 AM 10/21/2015    1:10 PM 06/21/2013    2:29 PM  CBC  WBC  6.7     8.9  9.7   Hemoglobin 13.5 - 17.5 14.8     16.3  10.1  Hematocrit 41 - 53 43     46.7  28.9   Platelets 150 - 399 182     140  175      This result is from an external source.    No results found for: "VD25OH", "VITAMINB12"  Clinical ASCVD: Yes  The 10-year ASCVD risk score (Arnett DK, et al., 2019) is: 33.1%   Values used to calculate the score:     Age: 66 years     Sex: Male     Is Non-Hispanic African American: No     Diabetic: Yes     Tobacco smoker: Yes     Systolic Blood Pressure: 99991111 mmHg     Is BP treated: Yes     HDL Cholesterol: 46 mg/dL     Total Cholesterol: 141 mg/dL       03/01/2022    1:00 PM 02/28/2022   10:56 AM 02/12/2022    1:35 PM  Depression screen PHQ 2/9  Decreased Interest 0 0 0  Down, Depressed, Hopeless 1 0 0  PHQ - 2 Score 1 0 0  Altered sleeping  0 0  Tired, decreased energy  1 1  Change in appetite  0 0  Feeling bad or failure about yourself   0 0  Trouble concentrating   0 0  Moving slowly or fidgety/restless  0 0  Suicidal thoughts  0 0  PHQ-9 Score  1 1  Difficult doing work/chores  Not difficult at all Not difficult at all     Social History   Tobacco Use  Smoking Status Every Day   Packs/day: 1.00   Years: 41.00   Total pack years: 41.00   Types: Cigarettes  Smokeless Tobacco Never  Tobacco Comments   since age 57.   BP Readings from Last 3 Encounters:  04/05/22 (!) 186/93  02/12/22 138/70  01/30/22 (!) 150/60   Pulse Readings from Last 3 Encounters:  04/05/22 84  02/12/22 86  01/30/22 95   Wt Readings from Last 3 Encounters:  04/05/22 222 lb (100.7 kg)  02/28/22 216 lb (98 kg)  01/30/22 216 lb (98 kg)   BMI Readings from Last 3 Encounters:  04/05/22 30.96 kg/m  02/28/22 30.13 kg/m  01/30/22 30.13 kg/m    Allergies  Allergen Reactions   No Known Allergies     Medications Reviewed Today     Reviewed by Germaine Pomfret, RPH (Pharmacist) on 04/27/22 at 1356  Med List Status: <None>   Medication Order Taking? Sig Documenting Provider Last Dose Status Informant  alprazolam (XANAX) 2 MG tablet [id number] No TAKE 1 TABLET BY MOUTH THREE TIMES DAILY AS NEEDED FOR ANXIETY Birdie Sons, MD Taking Active   amLODipine (NORVASC) 10 MG tablet [id number] No Take 10 mg by mouth daily.  [provider] Taking Active            Med Note Minerva Ends, FELECIA N   Thu Mar 01, 2022 12:51 PM) Taking 10 mg in morning  BD INSULIN SYRINGE U/F 31G X 5/16" 1 ML MISC [id number] No  [provider] Taking Active   calcitRIOL (ROCALTROL) 0.25 MCG capsule [id number] No Take 1 capsule (0.25 mcg total) by mouth daily. Birdie Sons, MD Taking Active   carvedilol (COREG) 12.5 MG tablet [id number] No Take 1.5 tablets (18.75 mg total) by mouth 2 (two) times daily. Minna Merritts, MD Taking Active            Med Note [facility], FELECIA N  Thu Mar 01, 2022 12:50 PM) Taking 18.'75mg'$  twice a day.  dapagliflozin propanediol ([name]) 10 MG  TABS tablet [id number]  Take 1 tablet (10 mg total) by mouth daily before breakfast. Patient receives through [name] through Dec 2023 Birdie Sons, MD  Active   EDEX 40 MCG injection [id number] No 40 mcg by Intracavitary route as needed. [provider] Taking Active            Med Note Minerva Ends, [facility] N   Thu Mar 01, 2022  1:09 PM) Reports not taking  insulin aspart (NOVOLOG) 100 UNIT/ML injection [id number] No Inject 15 Units into the skin 3 (three) times daily before meals. [provider] Taking Active Self           Med Note Minerva Ends, [facility] N   Thu Mar 01, 2022 12:42 PM) Reports receiving via [name]. Reports receiving assistance application via mail. Will submit  insulin degludec (TRESIBA FLEXTOUCH) 100 UNIT/ML FlexTouch Pen [id number] No Inject 43 Units into the skin daily. [provider] Taking Active            Med Note Michaelle Birks, Cathe Mons A   Fri Apr 27, 2022  1:55 PM)    losartan (COZAAR) 100 MG tablet [id number] No Take 1 tablet (100 mg total) by mouth at bedtime. Birdie Sons, MD Taking Active   MOUNJARO 5 MG/0.5ML Pen [id number] No Inject 5 mg into the skin once a week. [provider] Taking Active   mycophenolate (CELLCEPT) 500 MG tablet [id number] No Take 1,000 mg by mouth 2 (two) times daily. [provider] Taking Active            Med Note Minerva Ends, [facility] N   Thu Mar 01, 2022 12:52 PM) Reports taking taking 2 tabs in morning and 2 tabs at night  naloxone [facility]) nasal spray 4 mg/0.1 mL [id number] No Place 1 spray into the nose once. [provider] Taking Active            Med Note Minerva Ends, FELECIA N   Thu Mar 01, 2022  1:15 PM) Reports having available. Has not needed to use  omeprazole (PRILOSEC) 20 MG capsule [id number] No Take 20 mg by mouth daily as needed. [provider] Taking Active            Med Note Minerva Ends, [place] Mar 01, 2022 12:53 PM) Reports taking as needed  oxyCODONE (OXY IR/ROXICODONE)  5 MG immediate release tablet [id number]  Take 1-2 tablets (5-10 mg total) by mouth every 6 (six) hours as needed for severe pain. Birdie Sons, MD  Active   rosuvastatin (CRESTOR) 40 MG tablet [id number] No TAKE 1 TABLET EVERY DAY Gollan, Kathlene November, MD Taking Active   sildenafil (REVATIO) 20 MG tablet [id number] No  [provider] Taking Active            Med Note Minerva Ends, FELECIA N   Thu Mar 01, 2022  1:06 PM) Reports not taking  tacrolimus (PROGRAF) 1 MG capsule [id number] No Take 3 mg by mouth 2 (two) times daily.  [provider] Taking Active Self           Med Note Minerva Ends, [facility] N   Thu Mar 01, 2022 12:53 PM) Reports taking two in the morning and 2 at night  tamsulosin (FLOMAX) 0.4 MG CAPS capsule [id number] No Take 1 capsule (0.4 mg total) by mouth daily. Birdie Sons, MD Taking Active   tretinoin (RETIN-A) 0.05 %  cream [id number] No Apply topically as needed.  [provider] Taking Active   triamcinolone cream (KENALOG) 0.1 % [id number] No APPLY DAILY TO INFLAMED BUMPS AS NEEDED [provider] Taking Active   XYOSTED 50 MG/0.5ML Darden Palmer [id number] No  [provider] Taking Active            Med Note Minerva Ends, FELECIA N   Thu Mar 01, 2022  1:06 PM) Reports not taking            SDOH:  (Social Determinants of Health) assessments and interventions performed: Yes SDOH Interventions    Flowsheet Row Chronic Care Management from 03/01/2022 in [place] from 02/28/2022 in [place] Office Visit from 02/12/2022 in [place] Management from 06/13/2021 in [place] Management from 05/19/2021 in [place] from 02/27/2021 in [place] Interventions        Food Insecurity Interventions Intervention Not Indicated  Intervention Not Indicated -- -- -- Intervention Not Indicated  Housing Interventions Intervention Not Indicated Intervention Not Indicated -- -- -- Intervention Not Indicated  Transportation Interventions Intervention Not Indicated Intervention Not Indicated -- -- -- Intervention Not Indicated  Utilities Interventions Intervention Not Indicated Intervention Not Indicated -- -- -- --  Alcohol Usage Interventions Intervention Not Indicated (Score <7) Intervention Not Indicated (Score <7) -- -- -- --  Depression Interventions/Treatment  PHQ2-9 Score <4 Follow-up Not Indicated -- PHQ2-9 Score <4 Follow-up Not Indicated -- -- --  Financial Strain Interventions Intervention Not Indicated Intervention Not Indicated -- Intervention Not Indicated Other (Comment)  [PAP] Intervention Not Indicated  Physical Activity Interventions Other (Comments)  [Ability to exercise limited d/t chronic diseases. Reports decline in activity tolerance] Patient Refused -- -- -- Intervention Not Indicated  Stress Interventions Other (Comment)  [Reports d/t declining health] Intervention Not Indicated -- -- -- Intervention Not Indicated  Social Connections Interventions Intervention Not Indicated Intervention Not Indicated -- -- -- Intervention Not Indicated      SDOH Screenings   Food Insecurity: No Food Insecurity (03/01/2022)  Housing: Low Risk  (03/01/2022)  Transportation Needs: No Transportation Needs (03/01/2022)  Utilities: Not At Risk (03/01/2022)  Alcohol Screen: Low Risk  (02/28/2022)  Depression (PHQ2-9): Low Risk  (03/01/2022)  Financial Resource Strain: Medium Risk (03/01/2022)  Physical Activity: Inactive (03/01/2022)  Social Connections: Moderately Integrated (03/01/2022)  Recent Concern: Social Connections - Moderately Isolated (02/28/2022)  Stress: Stress Concern Present (03/01/2022)  Tobacco Use: High Risk (04/05/2022)    Medication Assistance:   Tyler Aas and Novolog obtained through [name] medication  assistance program.  Enrollment ends Dec 2023   Wilder Glade obtained through [name] medication assistance program.  Enrollment ends Dec 2023  Medication Access: Within the past 30 days, how often has patient missed a dose of medication? No Is a pillbox or other method used to improve adherence? Yes  Factors that may affect medication adherence? no barriers identified Are meds synced by current pharmacy? No  Are meds delivered by current pharmacy? No  Does patient experience delays in picking up medications due to transportation concerns? No   [facility] Reviewed: Is patient disadvantaged to use [facility]?: Yes  Current Rx insurance plan: [employer] Name and location of Current pharmacy:  [facility] [address], [place] - [place] [address] Phone: [phone] Fax: [phone]  [facility] ([facility]  Bundle Only) - [place], [place] - [address] [place] [address] Phone: [phone] Fax: [place], [address] [place] [address] Phone: [phone] Fax: [phone]  [place], [place] [address] Phone: [phone] Fax: [phone]  Reason patient declined to change pharmacies: Disadvantaged due to insurance/mail order  Compliance/Adherence/Medication fill history: Care Gaps: HIV  Shingrix  Tdap  Covid   Star-Rating Drugs: Rosuvastatin 40 mg last filled on 01/31/22 for a 90-Day supply via [place] Losartan 100 mg last filled on 01/31/22 for a 90-Day supply via [place]  Assessment/Plan  Hypertension (BP goal <130/80) -Controlled -Current treatment: Amlodipine 10 mg daily Carvedilol 12.5 mg twice daily   Losartan 100 mg daily   -Medications previously tried: NA  -Current home readings: 140/78 -Denies  hypotensive/hypertensive symptoms -Recommended to continue current medication  Hyperlipidemia: (LDL goal < 70) -History of PAD, CAD  -Controlled -Current treatment: Atorvastatin 80 mg daily  -Medications previously tried: NA  -Continue current medications   Diabetes (A1c goal <7%) -Uncontrolled -Managed by Dr. Honor Junes -Current medications: [name] 10 mg daily: Appropriate, Query effective Novolog 15 units three times daily + 2 units for every 50 units above 150: Appropriate, Query effective  Ozempic 0.5 mg weekly  Tresiba 45 units daily (0.45 u/kg): Appropriate, Query effective -Medications previously tried: Lantus (Formulary)  -Current home glucose readings  Target 1/6-1/19 1/20-2/2 2/20-3/4  Number of days worn ? 14 days '14 14 14  '$ % of time active ? 70% 73% 74% 55%  Mean Glucose (mg/dL)  176 189 221  GMI  7.5% 7.8%   Glycemic Variability (%CV) ?36% 45.2% 47.2% 54%  Time above >250 mg/dL <5% 18% 22% 41%  Time above 70-180 mg/dL <25% 23% 29% 14%  Time in range: 70-180 mg/dL >70% 56% 44% 42%  Time below 70 mg/dL <4% 3% 5% 3%  Time below 54 mg/dL <1% 0% 0% 0%  -Dietary Patterns: Dramatic changes to diet, cut out sodas, cut out fried foods, and has cut out late night snacking.   Patient continues to struggle with high variability in blood sugar due to inconsistent eating habits. We reviewed rule of 15 for hypoglycemia. He does not feel Darcel Bayley is working well for him and has gone back to using an old supply of Ozempic. He is struggling with nausea, but thinks it helps him with weight loss and appetite control better than Mounjaro.  -STOP Mounjaro  -START Ozempic 0.5 mg weekly  -PAP is still in progress for patient, mailed out to patient home today.    Anxiety (Goal: Maintain stable mood and sleep) -Controlled -Current treatment: Alprazolam 2 mg three times daily as needed - Sleep  -Medications previously tried/failed: NA -PHQ9: 0 -GAD7: 5 -Continue current medications    Renal Transplant  (Goal: prevent rejection of kidney ) -Managed by Dr. Holley Raring  -Controlled -Current treatment  Mycophenolate 500 mg 2 tablets twice daily  Tacrolimus 1 mg 3 capsules twice daily  -Medications previously tried: Myfortic (cost) -Recommended to continue current medication  Tobacco use (Goal Quit smoking) -Uncontrolled -Previous quit attempts: Chantix (nightmares), nicotine gum (ulcers), nicotine patch (stickiness) -Current treatment  17-18 cigarettes daily  -Patient smokes Within 30 minutes of waking -Patient triggers include: stress and finishing a meal -Patient was unwilling to discuss smoking cessation during today's visit.  -Continue working to cut down  on cigarette use.   Chronic Kidney Disease Stage 3a  -All medications assessed for renal dosing and appropriateness in chronic kidney disease. -Recommended to continue current medication  [name], PharmD, [place], [name]  Clinical Pharmacist Practitioner  [facility] [phone]

## 2022-06-04 DIAGNOSIS — I10 Essential (primary) hypertension: Secondary | ICD-10-CM | POA: Diagnosis not present

## 2022-06-04 DIAGNOSIS — N2581 Secondary hyperparathyroidism of renal origin: Secondary | ICD-10-CM | POA: Diagnosis not present

## 2022-06-04 DIAGNOSIS — R809 Proteinuria, unspecified: Secondary | ICD-10-CM | POA: Diagnosis not present

## 2022-06-04 DIAGNOSIS — Z940 Kidney transplant status: Secondary | ICD-10-CM | POA: Diagnosis not present

## 2022-06-04 DIAGNOSIS — N186 End stage renal disease: Secondary | ICD-10-CM | POA: Diagnosis not present

## 2022-06-15 ENCOUNTER — Telehealth: Payer: Self-pay

## 2022-06-15 NOTE — Telephone Encounter (Unsigned)
Copied from [place] [id number]. Topic: General - Inquiry >> Jun 15, 2022  1:45 PM Marcellus Scott wrote: Reason for [id number] tech from [facility] is calling to f/u on a document that was sent on March 12. Stated includes a medication list and recommendations for opioids.  Stated she will re-fax today. It needs to be filled out by the PCP. Please advise.

## 2022-06-18 ENCOUNTER — Other Ambulatory Visit: Payer: Self-pay | Admitting: Family Medicine

## 2022-06-18 DIAGNOSIS — M79604 Pain in right leg: Secondary | ICD-10-CM

## 2022-06-18 DIAGNOSIS — R52 Pain, unspecified: Secondary | ICD-10-CM

## 2022-06-18 NOTE — Telephone Encounter (Signed)
Patient called to have the oxyCODONE (OXY IR/ROXICODONE) 5 MG immediate release tablet refilled. [place] [place], [place] Phone: [phone]  Fax: [phone]      Please advise patient

## 2022-06-19 NOTE — Telephone Encounter (Signed)
Requested medication (s) are due for refill today: Yes  Requested medication (s) are on the active medication list: Yes  Last refill:  05/21/22  Future visit scheduled: No  Notes to clinic:  Unable to refill per protocol, cannot delegate.      Requested Prescriptions  Pending Prescriptions Disp Refills   oxyCODONE (OXY IR/ROXICODONE) 5 MG immediate release tablet 240 tablet 0    Sig: Take 1-2 tablets (5-10 mg total) by mouth every 6 (six) hours as needed for severe pain.     Not Delegated - Analgesics:  Opioid Agonists Failed - 06/18/2022 10:57 AM      Failed - This refill cannot be delegated      Failed - Urine Drug Screen completed in last 360 days      Failed - Valid encounter within last 3 months    Recent Outpatient Visits           4 months ago Urinary hesitancy   [place] Birdie Sons, MD   1 year ago Primary hypertension   [place], Donald E, MD   1 year ago Type 2 diabetes mellitus with diabetic nephropathy, without long-term current use of insulin ([place])   [place] Birdie Sons, MD   1 year ago Type 2 diabetes mellitus with diabetic nephropathy, without long-term current use of insulin ([place])   [place] Birdie Sons, MD   2 years ago Type 2 diabetes mellitus with diabetic nephropathy, with long-term current use of insulin ([place])   [place] Caryn Section, Kirstie Peri, MD

## 2022-06-20 MED ORDER — OXYCODONE HCL 5 MG PO TABS: 5.0000 mg | ORAL_TABLET | Freq: Four times a day (QID) | ORAL | 0 refills | Status: DC | PRN

## 2022-06-26 ENCOUNTER — Other Ambulatory Visit: Payer: Self-pay | Admitting: Family Medicine

## 2022-07-02 NOTE — Progress Notes (Unsigned)
Care Management & Coordination Services Pharmacy Note  07/02/2022 Name:  James Moreno MRN:  [id number] DOB:  Jun 22, 1965  Summary: Patient presents for follow-up consult.   Patient continues to struggle with high variability in blood sugar due to inconsistent eating habits. We reviewed rule of 15 for hypoglycemia. He does not feel Greggory KeenMounjaro is working well for him and has gone back to using an old supply of Ozempic. He is struggling with nausea, but thinks it helps him with weight loss and appetite control better than Mounjaro.    Recommendations/Changes made from today's visit: -STOP Mounjaro  -START Ozempic 0.5 mg weekly   -PAP is still in progress for patient, mailed out to patient home today.   Follow up plan: [name] follow-up 1 month   Subjective: James JanskyJames R Bohr is an 57 y.o. year old male who is a primary patient of Fisher, Demetrios Isaacsonald E, MD.  The care coordination team was consulted for assistance with disease management and care coordination needs.    Engaged with patient by telephone for follow up visit.  Recent office visits: 02/12/22: Patient presented to Dr. Sherrie MustacheFisher for follow-up. Tamsulosin.   Recent consult visits: 04/05/22: Patient presented to Dr. Gershon Crane'Connell (endocrinology)  02/26/22: Patient presented to Dr. Cherylann RatelLateef (nephrology)  01/30/22: Patient presented to Dr. Mariah MillingGollan (cardiology) for follow-up. Carvedilol 18.75 mg twice daily.  11/28/21: Patient presented to Dr. Gershon Crane'Connell (endocrinology). Ozempic 1 mg weekly.  10/25/21: Patient presented to Dr. Cherylann RatelLateef (nephrology)   Hospital visits: None in previous 6 months   Objective:  Lab Results  Component Value Date   CREATININE 2.81 (H) 02/12/2022   BUN 36 (H) 02/12/2022   EGFR 26 (L) 02/12/2022   GFRNONAA 39 (L) 09/17/2019   GFRAA 45 (L) 09/17/2019   NA 130 (L) 02/12/2022   K 3.9 02/12/2022   CALCIUM 9.8 02/12/2022   CO2 20 02/12/2022   GLUCOSE 165 (H) 02/12/2022    Lab Results  Component Value Date/Time   HGBA1C  6.9 11/28/2021 12:00 AM   HGBA1C 8.1 02/28/2021 12:00 AM   MICROALBUR 100 12/31/2016 02:13 PM    Last diabetic Eye exam:  Lab Results  Component Value Date/Time   HMDIABEYEEXA Retinopathy (A) 06/14/2021 12:00 AM    Last diabetic Foot exam: No results found for: "HMDIABFOOTEX"   Lab Results  Component Value Date   CHOL 141 06/20/2020   HDL 46 06/20/2020   LDLCALC 53 06/20/2020   TRIG 268 (H) 06/20/2020   CHOLHDL 3.1 06/20/2020       Latest Ref Rng & Units 02/12/2022    2:14 PM 06/20/2020    9:03 AM 09/17/2019    2:17 PM  Hepatic Function  Total Protein 6.0 - 8.5 g/dL   6.5   Albumin 3.8 - 4.9 g/dL 3.9  4.2  4.3   AST 0 - 40 IU/L   9   ALT 0 - 44 IU/L   9   Alk Phosphatase 48 - 121 IU/L   131   Total Bilirubin 0.0 - 1.2 mg/dL   0.6     Lab Results  Component Value Date/Time   TSH 1.34 12/12/2018 12:00 AM   TSH 1.72 06/13/2006 12:00 AM       Latest Ref Rng & Units 12/12/2018   12:00 AM 10/21/2015    1:10 PM 06/21/2013    2:29 PM  CBC  WBC  6.7     8.9  9.7   Hemoglobin 13.5 - 17.5 14.8     16.3  10.1  Hematocrit 41 - 53 43     46.7  28.9   Platelets 150 - 399 182     140  175      This result is from an external source.    No results found for: "VD25OH", "VITAMINB12"  Clinical ASCVD: Yes  The 10-year ASCVD risk score (Arnett DK, et al., 2019) is: 26.5%   Values used to calculate the score:     Age: 6 years     Sex: Male     Is Non-Hispanic African American: No     Diabetic: Yes     Tobacco smoker: Yes     Systolic Blood Pressure: 160 mmHg     Is BP treated: Yes     HDL Cholesterol: 46 mg/dL     Total Cholesterol: 141 mg/dL       14/09/8293    6:21 PM 02/28/2022   10:56 AM 02/12/2022    1:35 PM  Depression screen PHQ 2/9  Decreased Interest 0 0 0  Down, Depressed, Hopeless 1 0 0  PHQ - 2 Score 1 0 0  Altered sleeping  0 0  Tired, decreased energy  1 1  Change in appetite  0 0  Feeling bad or failure about yourself   0 0  Trouble concentrating   0 0  Moving slowly or fidgety/restless  0 0  Suicidal thoughts  0 0  PHQ-9 Score  1 1  Difficult doing work/chores  Not difficult at all Not difficult at all     Social History   Tobacco Use  Smoking Status Every Day   Packs/day: 1.00   Years: 41.00   Additional pack years: 0.00   Total pack years: 41.00   Types: Cigarettes  Smokeless Tobacco Never  Tobacco Comments   since age 78.   BP Readings from Last 3 Encounters:  04/05/22 (!) 186/93  02/12/22 138/70  01/30/22 (!) 150/60   Pulse Readings from Last 3 Encounters:  04/05/22 84  02/12/22 86  01/30/22 95   Wt Readings from Last 3 Encounters:  04/05/22 222 lb (100.7 kg)  02/28/22 216 lb (98 kg)  01/30/22 216 lb (98 kg)   BMI Readings from Last 3 Encounters:  04/05/22 30.96 kg/m  02/28/22 30.13 kg/m  01/30/22 30.13 kg/m    Allergies  Allergen Reactions   No Known Allergies     Medications Reviewed Today     Reviewed by Gaspar Cola, RPH (Pharmacist) on 04/27/22 at 1356  Med List Status: <None>   Medication Order Taking? Sig Documenting Provider Last Dose Status Informant  alprazolam (XANAX) 2 MG tablet [id number] No TAKE 1 TABLET BY MOUTH THREE TIMES DAILY AS NEEDED FOR ANXIETY [name], MD Taking Active   amLODipine (NORVASC) 10 MG tablet [id number] No Take 10 mg by mouth daily.  [provider] Taking Active            Med Note Constance Haw, FELECIA N   Thu Mar 01, 2022 12:51 PM) Taking 10 mg in morning  BD INSULIN SYRINGE U/F 31G X 5/16" 1 ML MISC [id number] No  [provider] Taking Active   calcitRIOL (ROCALTROL) 0.25 MCG capsule [id number] No Take 1 capsule (0.25 mcg total) by mouth daily. [name], MD Taking Active   carvedilol (COREG) 12.5 MG tablet [id number] No Take 1.5 tablets (18.75 mg total) by mouth 2 (two) times daily. Antonieta Iba, MD Taking Active  Med Note Constance Haw, FELECIA N   Thu Mar 01, 2022 12:50 PM) Taking 18.75mg  twice a day.   dapagliflozin propanediol ([name]) 10 MG TABS tablet [id number]  Take 1 tablet (10 mg total) by mouth daily before breakfast. Patient receives through [name] through Dec 2023 [name], MD  Active   EDEX 40 MCG injection [id number] No 40 mcg by Intracavitary route as needed. [provider] Taking Active            Med Note Constance Haw, [facility] N   Thu Mar 01, 2022  1:09 PM) Reports not taking  insulin aspart (NOVOLOG) 100 UNIT/ML injection [id number] No Inject 15 Units into the skin 3 (three) times daily before meals. [provider] Taking Active Self           Med Note Constance Haw, [facility] N   Thu Mar 01, 2022 12:42 PM) Reports receiving via [name]. Reports receiving assistance application via mail. Will submit  insulin degludec (TRESIBA FLEXTOUCH) 100 UNIT/ML FlexTouch Pen [id number] No Inject 43 Units into the skin daily. [provider] Taking Active            Med Note Lauretta Chester, Coy Saunas A   Fri Apr 27, 2022  1:55 PM)    losartan (COZAAR) 100 MG tablet [id number] No Take 1 tablet (100 mg total) by mouth at bedtime. [name], MD Taking Active   MOUNJARO 5 MG/0.5ML Pen [id number] No Inject 5 mg into the skin once a week. [provider] Taking Active   mycophenolate (CELLCEPT) 500 MG tablet [id number] No Take 1,000 mg by mouth 2 (two) times daily. [provider] Taking Active            Med Note Constance Haw, [facility] N   Thu Mar 01, 2022 12:52 PM) Reports taking taking 2 tabs in morning and 2 tabs at night  naloxone [facility]) nasal spray 4 mg/0.1 mL [id number] No Place 1 spray into the nose once. [provider] Taking Active            Med Note Constance Haw, FELECIA N   Thu Mar 01, 2022  1:15 PM) Reports having available. Has not needed to use  omeprazole (PRILOSEC) 20 MG capsule [id number] No Take 20 mg by mouth daily as needed. [provider] Taking Active            Med Note Constance Haw, Sarajane Jews Mar 01, 2022 12:53 PM) Reports  taking as needed  oxyCODONE (OXY IR/ROXICODONE) 5 MG immediate release tablet [id number]  Take 1-2 tablets (5-10 mg total) by mouth every 6 (six) hours as needed for severe pain. [name], MD  Active   rosuvastatin (CRESTOR) 40 MG tablet [id number] No TAKE 1 TABLET EVERY DAY Gollan, Tollie Pizza, MD Taking Active   sildenafil (REVATIO) 20 MG tablet [id number] No  [provider] Taking Active            Med Note Constance Haw, FELECIA N   Thu Mar 01, 2022  1:06 PM) Reports not taking  tacrolimus (PROGRAF) 1 MG capsule [id number] No Take 3 mg by mouth 2 (two) times daily.  [provider] Taking Active Self           Med Note Constance Haw, [facility] N   Thu Mar 01, 2022 12:53 PM) Reports taking two in the morning and 2 at night  tamsulosin (FLOMAX) 0.4 MG CAPS capsule [id number] No Take 1 capsule (0.4 mg total) by mouth daily. [name], MD  Taking Active   tretinoin (RETIN-A) 0.05 % cream [id number] No Apply topically as needed.  [provider] Taking Active   triamcinolone cream (KENALOG) 0.1 % [id number] No APPLY DAILY TO INFLAMED BUMPS AS NEEDED [provider] Taking Active   XYOSTED 50 MG/0.5ML Ivory Broad [id number] No  [provider] Taking Active            Med Note Constance Haw, FELECIA N   Thu Mar 01, 2022  1:06 PM) Reports not taking            SDOH:  (Social Determinants of Health) assessments and interventions performed: Yes SDOH Interventions    Flowsheet Row Chronic Care Management from 03/01/2022 in [facility] Clinical Support from 02/28/2022 in [facility] Office Visit from 02/12/2022 in [facility] Chronic Care Management from 06/13/2021 in [facility] Chronic Care Management from 05/19/2021 in [facility] Clinical Support from 02/27/2021 in [facility]  SDOH Interventions        Food Insecurity  Interventions Intervention Not Indicated Intervention Not Indicated -- -- -- Intervention Not Indicated  Housing Interventions Intervention Not Indicated Intervention Not Indicated -- -- -- Intervention Not Indicated  Transportation Interventions Intervention Not Indicated Intervention Not Indicated -- -- -- Intervention Not Indicated  Utilities Interventions Intervention Not Indicated Intervention Not Indicated -- -- -- --  Alcohol Usage Interventions Intervention Not Indicated (Score <7) Intervention Not Indicated (Score <7) -- -- -- --  Depression Interventions/Treatment  PHQ2-9 Score <4 Follow-up Not Indicated -- PHQ2-9 Score <4 Follow-up Not Indicated -- -- --  Financial Strain Interventions Intervention Not Indicated Intervention Not Indicated -- Intervention Not Indicated Other (Comment)  [PAP] Intervention Not Indicated  Physical Activity Interventions Other (Comments)  [Ability to exercise limited d/t chronic diseases. Reports decline in activity tolerance] Patient Refused -- -- -- Intervention Not Indicated  Stress Interventions Other (Comment)  [Reports d/t declining health] Intervention Not Indicated -- -- -- Intervention Not Indicated  Social Connections Interventions Intervention Not Indicated Intervention Not Indicated -- -- -- Intervention Not Indicated      SDOH Screenings   Food Insecurity: No Food Insecurity (03/01/2022)  Housing: Low Risk  (03/01/2022)  Transportation Needs: No Transportation Needs (03/01/2022)  Utilities: Not At Risk (03/01/2022)  Alcohol Screen: Low Risk  (02/28/2022)  Depression (PHQ2-9): Low Risk  (03/01/2022)  Financial Resource Strain: Medium Risk (03/01/2022)  Physical Activity: Inactive (03/01/2022)  Social Connections: Moderately Integrated (03/01/2022)  Recent Concern: Social Connections - Moderately Isolated (02/28/2022)  Stress: Stress Concern Present (03/01/2022)  Tobacco Use: High Risk (04/05/2022)    Medication Assistance:   Evaristo Bury and Novolog  obtained through [name] medication assistance program.  Enrollment ends Dec 2023   [name] obtained through [name] medication assistance program.  Enrollment ends Dec 2023  Medication Access: Within the past 30 days, how often has patient missed a dose of medication? No Is a pillbox or other method used to improve adherence? Yes  Factors that may affect medication adherence? no barriers identified Are meds synced by current pharmacy? No  Are meds delivered by current pharmacy? No  Does patient experience delays in picking up medications due to transportation concerns? No   [facility] Reviewed: Is patient disadvantaged to use [facility]?: Yes  Current Rx insurance plan: [employer] Name and location of Current pharmacy:  [facility] [address], [place] - [address] [place] [address]  Phone: [phone] Fax: [phone]  [facility] ([facility] Bundle Only) - [place], [place] - [address] [place] [address] Phone: [phone] Fax: [phone]  [facility] [phone] [place], [place] - [address] [place] [address] Phone: [phone] Fax: [phone]  [facility] - [place], [place] - [address] [place] [address] Phone: [phone] Fax: [phone]  Reason patient declined to change pharmacies: Disadvantaged due to insurance/mail order  Compliance/Adherence/Medication fill history: Care Gaps: HIV  Shingrix  Tdap  Covid   Star-Rating Drugs: Rosuvastatin 40 mg last filled on 01/31/22 for a 90-Day supply via [facility] Losartan 100 mg last filled on 01/31/22 for a 90-Day supply via [facility]  Assessment/Plan  Hypertension (BP goal <130/80) -Uncontrolled -Current treatment: Amlodipine 10 mg daily Carvedilol 12.5 mg twice daily   Losartan 100 mg daily   -Medications previously tried: NA  -Current  home readings: 140/78 -Denies hypotensive/hypertensive symptoms -Recommended to continue current medication  Hyperlipidemia: (LDL goal < 70) -History of PAD, CAD  -Controlled -Current treatment: Atorvastatin 80 mg daily  -Medications previously tried: NA  -Continue current medications   Diabetes (A1c goal <7%) -Uncontrolled -Managed by Dr. Gershon Crane -Current medications: [name] 10 mg daily: Appropriate, Query effective Novolog 15 units three times daily + 2 units for every 50 units above 150: Appropriate, Query effective  Ozempic 0.5 mg weekly  Tresiba 45 units daily (0.45 u/kg): Appropriate, Query effective -Medications previously tried: Lantus (Formulary)  -Current home glucose readings  Target 1/6-1/19 1/20-2/2 2/20-3/4 3/26 - 4/8   Number of days worn ? 14 days 14 14 14 14   % of time active ? 70% 73% 74% 55% 71%  Mean Glucose (mg/dL)  [phone] 092   GMI  7.5% 7.8%  7.7%  Glycemic Variability (%CV) ?36% 45.2% 47.2% 54% 49%  Time above >250 mg/dL <9% 57% 47% 34% 03%  Time above 70-180 mg/dL <70% 96% 43% 83% 81%  Time in range: 70-180 mg/dL >84% 03% 75% 43% 60%  Time below 70 mg/dL <6% 3% 5% 3% 3%  Time below 54 mg/dL <7% 0% 0% 0% 0%  -Dietary Patterns: Dramatic changes to diet, cut out sodas, cut out fried foods, and has cut out late night snacking.      Anxiety (Goal: Maintain stable mood and sleep) -Controlled -Current treatment: Alprazolam 2 mg three times daily as needed - Sleep  -Medications previously tried/failed: NA -PHQ9: 0 -GAD7: 5 -Continue current medications   Renal Transplant  (Goal: prevent rejection of kidney ) -Managed by Dr. Cherylann Ratel  -Controlled -Current treatment  Mycophenolate 500 mg 2 tablets twice daily  Tacrolimus 1 mg 3 capsules twice daily  -Medications previously tried: Myfortic (cost) -Recommended to continue current medication  Tobacco use (Goal Quit smoking) -Uncontrolled -Previous quit attempts: Chantix (nightmares), nicotine  gum (ulcers), nicotine patch (stickiness) -Current treatment  17-18 cigarettes daily  -Patient smokes Within 30 minutes of waking -Patient triggers include: stress and finishing a meal -Patient was unwilling to discuss smoking cessation during today's visit.  -Continue working to cut down on cigarette use.   Chronic Kidney Disease Stage 3a  -All medications assessed for renal dosing and appropriateness in chronic kidney disease. -Recommended to continue current medication  [name], PharmD, [place]y [place], [name]  Clinical Pharmacist Practitioner  [facility] [phone]

## 2022-07-04 ENCOUNTER — Telehealth: Payer: Self-pay | Admitting: Family Medicine

## 2022-07-04 DIAGNOSIS — R3911 Hesitancy of micturition: Secondary | ICD-10-CM

## 2022-07-04 MED ORDER — TAMSULOSIN HCL 0.4 MG PO CAPS: 0.4000 mg | ORAL_CAPSULE | Freq: Every day | ORAL | 3 refills | Status: DC

## 2022-07-04 NOTE — Telephone Encounter (Signed)
Patient needs refills on Tamsulosin 0.4 mg. Sent to [facility] on [address]

## 2022-07-04 NOTE — Addendum Note (Signed)
Addended by: Hyacinth Meeker on: 07/04/2022 01:42 PM   Modules accepted: Orders

## 2022-07-12 ENCOUNTER — Encounter: Payer: Self-pay | Admitting: Podiatry

## 2022-07-16 ENCOUNTER — Ambulatory Visit: Payer: Medicare HMO | Admitting: Podiatry

## 2022-07-16 ENCOUNTER — Other Ambulatory Visit: Payer: Self-pay | Admitting: Family Medicine

## 2022-07-16 DIAGNOSIS — M79604 Pain in right leg: Secondary | ICD-10-CM

## 2022-07-16 DIAGNOSIS — L905 Scar conditions and fibrosis of skin: Secondary | ICD-10-CM

## 2022-07-16 NOTE — Telephone Encounter (Signed)
Medication Refill - Medication: oxyCODONE (OXY IR/ROXICODONE) 5 MG immediate release tablet   Has the patient contacted their pharmacy? No. (Agent: If no, request that the patient contact the pharmacy for the refill. If patient does not wish to contact the pharmacy document the reason why and proceed with request.) (Agent: If yes, when and what did the pharmacy advise?)  Preferred Pharmacy (with phone number or street name): [facility] [address] [place] - [address] [place] [address] Phone: [phone]  Fax: [phone]  Has the patient been seen for an appointment in the last year OR does the patient have an upcoming appointment? Yes.

## 2022-07-17 ENCOUNTER — Ambulatory Visit: Payer: Medicare HMO

## 2022-07-17 DIAGNOSIS — Z794 Long term (current) use of insulin: Secondary | ICD-10-CM

## 2022-07-17 DIAGNOSIS — E113592 Type 2 diabetes mellitus with proliferative diabetic retinopathy without macular edema, left eye: Secondary | ICD-10-CM | POA: Diagnosis not present

## 2022-07-17 DIAGNOSIS — N1831 Chronic kidney disease, stage 3a: Secondary | ICD-10-CM | POA: Diagnosis not present

## 2022-07-17 MED ORDER — TRESIBA FLEXTOUCH 100 UNIT/ML ~~LOC~~ SOPN: 39.0000 [IU] | PEN_INJECTOR | Freq: Every day | SUBCUTANEOUS | Status: DC

## 2022-07-17 NOTE — Telephone Encounter (Signed)
Requested medications are due for refill today.  Provider to decide  Requested medications are on the active medications list.  yes  Last refill. 06/20/2022 #[id number] 1OX  Future visit scheduled.   yes  Notes to clinic.  Refill not delegated.    Requested Prescriptions  Pending Prescriptions Disp Refills   oxyCODONE (OXY IR/ROXICODONE) 5 MG immediate release tablet 240 tablet 0    Sig: Take 1-2 tablets (5-10 mg total) by mouth every 6 (six) hours as needed for severe pain.     Not Delegated - Analgesics:  Opioid Agonists Failed - 07/16/2022  4:34 PM      Failed - This refill cannot be delegated      Failed - Urine Drug Screen completed in last 360 days      Failed - Valid encounter within last 3 months    Recent Outpatient Visits           5 months ago Urinary hesitancy   [facility] [name], MD   1 year ago Primary hypertension   [facility] [name], MD   1 year ago Type 2 diabetes mellitus with diabetic nephropathy, without long-term current use of insulin (HCC)   [facility] [name], MD   2 years ago Type 2 diabetes mellitus with diabetic nephropathy, without long-term current use of insulin (HCC)   [facility] [name], MD   2 years ago Type 2 diabetes mellitus with diabetic nephropathy, with long-term current use of insulin (HCC)   [facility] [name], MD       Future Appointments             In 2 weeks Fisher, Demetrios Isaacs, MD [facility]

## 2022-07-17 NOTE — Progress Notes (Signed)
Care Management & Coordination Services Pharmacy Note  07/17/2022 Name:  James Moreno MRN:  [id number] DOB:  04/03/1965  Summary: Patient presents for follow-up consult.   -CGM report shows patient continue to note significant glycemic variability, with 7% below 70.   Recommendations/Changes made from today's visit: -DECREASE Tresiba to 39 units daily   Follow up plan: [name] follow-up 1 month   Subjective: James Moreno is an 57 y.o. year old male who is a primary patient of Fisher, Demetrios Isaacs, MD.  The care coordination team was consulted for assistance with disease management and care coordination needs.    Engaged with patient by telephone for follow up visit.  Recent office visits: 02/12/22: Patient presented to Dr. Sherrie Mustache for follow-up. Tamsulosin.   Recent consult visits: 06/04/22: Patient presented to Dr. Cherylann Ratel (nephrology)  04/05/22: Patient presented to Dr. Gershon Crane (endocrinology)  02/26/22: Patient presented to Dr. Cherylann Ratel (nephrology)  01/30/22: Patient presented to Dr. Mariah Milling (cardiology) for follow-up. Carvedilol 18.75 mg twice daily.  11/28/21: Patient presented to Dr. Gershon Crane (endocrinology). Ozempic 1 mg weekly.    Hospital visits: None in previous 6 months   Objective:  Lab Results  Component Value Date   CREATININE 2.81 (H) 02/12/2022   BUN 36 (H) 02/12/2022   EGFR 26 (L) 02/12/2022   GFRNONAA 39 (L) 09/17/2019   GFRAA 45 (L) 09/17/2019   NA 130 (L) 02/12/2022   K 3.9 02/12/2022   CALCIUM 9.8 02/12/2022   CO2 20 02/12/2022   GLUCOSE 165 (H) 02/12/2022    Lab Results  Component Value Date/Time   HGBA1C 6.9 11/28/2021 12:00 AM   HGBA1C 8.1 02/28/2021 12:00 AM   MICROALBUR 100 12/31/2016 02:13 PM    Last diabetic Eye exam:  Lab Results  Component Value Date/Time   HMDIABEYEEXA Retinopathy (A) 06/14/2021 12:00 AM    Last diabetic Foot exam: No results found for: "HMDIABFOOTEX"   Lab Results  Component Value Date   CHOL 141 06/20/2020    HDL 46 06/20/2020   LDLCALC 53 06/20/2020   TRIG 268 (H) 06/20/2020   CHOLHDL 3.1 06/20/2020       Latest Ref Rng & Units 02/12/2022    2:14 PM 06/20/2020    9:03 AM 09/17/2019    2:17 PM  Hepatic Function  Total Protein 6.0 - 8.5 g/dL   6.5   Albumin 3.8 - 4.9 g/dL 3.9  4.2  4.3   AST 0 - 40 IU/L   9   ALT 0 - 44 IU/L   9   Alk Phosphatase 48 - 121 IU/L   131   Total Bilirubin 0.0 - 1.2 mg/dL   0.6     Lab Results  Component Value Date/Time   TSH 1.34 12/12/2018 12:00 AM   TSH 1.72 06/13/2006 12:00 AM       Latest Ref Rng & Units 12/12/2018   12:00 AM 10/21/2015    1:10 PM 06/21/2013    2:29 PM  CBC  WBC  6.7     8.9  9.7   Hemoglobin 13.5 - 17.5 14.8     16.3  10.1   Hematocrit 41 - 53 43     46.7  28.9   Platelets 150 - 399 182     140  175      This result is from an external source.    No results found for: "VD25OH", "VITAMINB12"  Clinical ASCVD: Yes  The 10-year ASCVD risk score (Arnett DK, et al., 2019) is:  26.5%   Values used to calculate the score:     Age: 55 years     Sex: Male     Is Non-Hispanic African American: No     Diabetic: Yes     Tobacco smoker: Yes     Systolic Blood Pressure: 160 mmHg     Is BP treated: Yes     HDL Cholesterol: 46 mg/dL     Total Cholesterol: 141 mg/dL       16/03/958    4:54 PM 02/28/2022   10:56 AM 02/12/2022    1:35 PM  Depression screen PHQ 2/9  Decreased Interest 0 0 0  Down, Depressed, Hopeless 1 0 0  PHQ - 2 Score 1 0 0  Altered sleeping  0 0  Tired, decreased energy  1 1  Change in appetite  0 0  Feeling bad or failure about yourself   0 0  Trouble concentrating  0 0  Moving slowly or fidgety/restless  0 0  Suicidal thoughts  0 0  PHQ-9 Score  1 1  Difficult doing work/chores  Not difficult at all Not difficult at all     Social History   Tobacco Use  Smoking Status Every Day   Packs/day: 1.00   Years: 41.00   Additional pack years: 0.00   Total pack years: 41.00   Types: Cigarettes   Smokeless Tobacco Never  Tobacco Comments   since age 8.   BP Readings from Last 3 Encounters:  04/05/22 (!) 186/93  02/12/22 138/70  01/30/22 (!) 150/60   Pulse Readings from Last 3 Encounters:  04/05/22 84  02/12/22 86  01/30/22 95   Wt Readings from Last 3 Encounters:  04/05/22 222 lb (100.7 kg)  02/28/22 216 lb (98 kg)  01/30/22 216 lb (98 kg)   BMI Readings from Last 3 Encounters:  04/05/22 30.96 kg/m  02/28/22 30.13 kg/m  01/30/22 30.13 kg/m    Allergies  Allergen Reactions   No Known Allergies     Medications Reviewed Today     Reviewed by Gaspar Cola, RPH (Pharmacist) on 04/27/22 at 1356  Med List Status: <None>   Medication Order Taking? Sig Documenting Provider Last Dose Status Informant  alprazolam (XANAX) 2 MG tablet [id number] No TAKE 1 TABLET BY MOUTH THREE TIMES DAILY AS NEEDED FOR ANXIETY [name], MD Taking Active   amLODipine (NORVASC) 10 MG tablet [id number] No Take 10 mg by mouth daily.  [provider] Taking Active            Med Note Constance Haw, FELECIA N   Thu Mar 01, 2022 12:51 PM) Taking 10 mg in morning  BD INSULIN SYRINGE U/F 31G X 5/16" 1 ML MISC [id number] No  [provider] Taking Active   calcitRIOL (ROCALTROL) 0.25 MCG capsule [id number] No Take 1 capsule (0.25 mcg total) by mouth daily. [name], MD Taking Active   carvedilol (COREG) 12.5 MG tablet [id number] No Take 1.5 tablets (18.75 mg total) by mouth 2 (two) times daily. Antonieta Iba, MD Taking Active            Med Note Constance Haw, [facility] N   Thu Mar 01, 2022 12:50 PM) Taking 18.75mg  twice a day.  dapagliflozin propanediol ([name]) 10 MG TABS tablet [id number]  Take 1 tablet (10 mg total) by mouth daily before breakfast. Patient receives through [name] through Dec 2023 [name], MD  Active   EDEX 40 MCG injection [id number] No 40 mcg  by Intracavitary route as needed. [provider] Taking Active            Med Note Constance Haw,  [facility] N   Thu Mar 01, 2022  1:09 PM) Reports not taking  insulin aspart (NOVOLOG) 100 UNIT/ML injection [id number] No Inject 15 Units into the skin 3 (three) times daily before meals. [provider] Taking Active Self           Med Note Constance Haw, [facility] N   Thu Mar 01, 2022 12:42 PM) Reports receiving via [name]. Reports receiving assistance application via mail. Will submit  insulin degludec (TRESIBA FLEXTOUCH) 100 UNIT/ML FlexTouch Pen [id number] No Inject 43 Units into the skin daily. [provider] Taking Active            Med Note Lauretta Chester, Coy Saunas A   Fri Apr 27, 2022  1:55 PM)    losartan (COZAAR) 100 MG tablet [id number] No Take 1 tablet (100 mg total) by mouth at bedtime. [name], MD Taking Active   MOUNJARO 5 MG/0.5ML Pen [id number] No Inject 5 mg into the skin once a week. [provider] Taking Active   mycophenolate (CELLCEPT) 500 MG tablet [id number] No Take 1,000 mg by mouth 2 (two) times daily. [provider] Taking Active            Med Note Constance Haw, [facility] N   Thu Mar 01, 2022 12:52 PM) Reports taking taking 2 tabs in morning and 2 tabs at night  naloxone [facility]) nasal spray 4 mg/0.1 mL [id number] No Place 1 spray into the nose once. [provider] Taking Active            Med Note Constance Haw, FELECIA N   Thu Mar 01, 2022  1:15 PM) Reports having available. Has not needed to use  omeprazole (PRILOSEC) 20 MG capsule [id number] No Take 20 mg by mouth daily as needed. [provider] Taking Active            Med Note Constance Haw, Sarajane Jews Mar 01, 2022 12:53 PM) Reports taking as needed  oxyCODONE (OXY IR/ROXICODONE) 5 MG immediate release tablet [id number]  Take 1-2 tablets (5-10 mg total) by mouth every 6 (six) hours as needed for severe pain. [name], MD  Active   rosuvastatin (CRESTOR) 40 MG tablet [id number] No TAKE 1 TABLET EVERY DAY Gollan, Tollie Pizza, MD Taking Active   sildenafil (REVATIO) 20 MG tablet  [id number] No  [provider] Taking Active            Med Note Constance Haw, FELECIA N   Thu Mar 01, 2022  1:06 PM) Reports not taking  tacrolimus (PROGRAF) 1 MG capsule [id number] No Take 3 mg by mouth 2 (two) times daily.  [provider] Taking Active Self           Med Note Constance Haw, [facility] N   Thu Mar 01, 2022 12:53 PM) Reports taking two in the morning and 2 at night  tamsulosin (FLOMAX) 0.4 MG CAPS capsule [id number] No Take 1 capsule (0.4 mg total) by mouth daily. [name], MD Taking Active   tretinoin (RETIN-A) 0.05 % cream [id number] No Apply topically as needed.  [provider] Taking Active   triamcinolone cream (KENALOG) 0.1 % [id number] No APPLY DAILY TO INFLAMED BUMPS AS NEEDED [provider] Taking Active   XYOSTED 50 MG/0.5ML Ivory Broad [id number] No  [provider] Taking Active  Med Note Constance Haw, FELECIA N   Thu Mar 01, 2022  1:06 PM) Reports not taking            SDOH:  (Social Determinants of Health) assessments and interventions performed: Yes SDOH Interventions    Flowsheet Row Chronic Care Management from 03/01/2022 in [facility] Clinical Support from 02/28/2022 in [facility] Office Visit from 02/12/2022 in [facility] Chronic Care Management from 06/13/2021 in [facility] Chronic Care Management from 05/19/2021 in [facility] Clinical Support from 02/27/2021 in [facility]  SDOH Interventions        Food Insecurity Interventions Intervention Not Indicated Intervention Not Indicated -- -- -- Intervention Not Indicated  Housing Interventions Intervention Not Indicated Intervention Not Indicated -- -- -- Intervention Not Indicated  Transportation Interventions Intervention Not Indicated Intervention Not Indicated -- -- -- Intervention Not Indicated  Utilities  Interventions Intervention Not Indicated Intervention Not Indicated -- -- -- --  Alcohol Usage Interventions Intervention Not Indicated (Score <7) Intervention Not Indicated (Score <7) -- -- -- --  Depression Interventions/Treatment  PHQ2-9 Score <4 Follow-up Not Indicated -- PHQ2-9 Score <4 Follow-up Not Indicated -- -- --  Financial Strain Interventions Intervention Not Indicated Intervention Not Indicated -- Intervention Not Indicated Other (Comment)  [PAP] Intervention Not Indicated  Physical Activity Interventions Other (Comments)  [Ability to exercise limited d/t chronic diseases. Reports decline in activity tolerance] Patient Refused -- -- -- Intervention Not Indicated  Stress Interventions Other (Comment)  [Reports d/t declining health] Intervention Not Indicated -- -- -- Intervention Not Indicated  Social Connections Interventions Intervention Not Indicated Intervention Not Indicated -- -- -- Intervention Not Indicated      SDOH Screenings   Food Insecurity: No Food Insecurity (03/01/2022)  Housing: Low Risk  (03/01/2022)  Transportation Needs: No Transportation Needs (03/01/2022)  Utilities: Not At Risk (03/01/2022)  Alcohol Screen: Low Risk  (02/28/2022)  Depression (PHQ2-9): Low Risk  (03/01/2022)  Financial Resource Strain: Medium Risk (03/01/2022)  Physical Activity: Inactive (03/01/2022)  Social Connections: Moderately Integrated (03/01/2022)  Recent Concern: Social Connections - Moderately Isolated (02/28/2022)  Stress: Stress Concern Present (03/01/2022)  Tobacco Use: High Risk (04/05/2022)    Medication Assistance:   Evaristo Bury and Novolog obtained through [name] medication assistance program.  Enrollment ends Dec 2023   [name] obtained through [name] medication assistance program.  Enrollment ends Dec 2023  Medication Access: Within the past 30 days, how often has patient missed a dose of medication? No Is a pillbox or other method used to improve adherence? Yes  Factors  that may affect medication adherence? no barriers identified Are meds synced by current pharmacy? No  Are meds delivered by current pharmacy? No  Does patient experience delays in picking up medications due to transportation concerns? No   [facility] Reviewed: Is patient disadvantaged to use [facility]?: Yes  Current Rx insurance plan: [employer] Name and location of Current pharmacy:  [facility] [address], [place] - [address] [place] [address] Phone: [phone] Fax: [phone]  [facility] ([facility] Only) - [place], [place] - [address] [place] [address] Phone: [phone] Fax: [phone]  [facility] [phone] [place], [place] - [address] [place] [address] Phone: [phone] Fax: [phone]  [facility] - [place], [place] - [address] [phone] [address] [place] [address] Phone: [phone] Fax: [phone]  Reason patient declined to change pharmacies: Disadvantaged due to insurance/mail order  Compliance/Adherence/Medication fill history: Care Gaps: HIV  Shingrix  Tdap  Covid   Star-Rating Drugs: Rosuvastatin 40 mg last filled on 01/31/22 for a 90-Day supply via [facility] Losartan 100 mg last filled on 01/31/22 for a 90-Day supply via [facility]  Assessment/Plan  Hypertension (BP goal <130/80) -Uncontrolled -Current treatment: Amlodipine 10 mg daily Carvedilol 12.5 mg twice daily   Losartan 100 mg daily   -Medications previously tried: NA  -Current home readings: 140/78 -Denies hypotensive/hypertensive symptoms -Recommended to continue current medication  Hyperlipidemia: (LDL goal < 70) -History of PAD, CAD  -Controlled -Current treatment: Atorvastatin 80 mg daily  -Medications previously tried: NA  -Continue current medications   Diabetes (A1c goal <7%) -Uncontrolled -Managed by  Dr. Gershon Crane -Current medications: [name] 10 mg daily: Appropriate, Query effective Novolog 15 units three times daily + 2 units for every 50 units above 150: Appropriate, Query effective  Ozempic 0.5 mg weekly  Tresiba 43 units daily (0.45 u/kg): Appropriate, Query effective -Medications previously tried: Lantus (Formulary)  -Current home glucose readings  Target 1/20-2/2 2/20-3/4 3/26 - 4/8  4/10-4/23  Number of days worn ? 14 days 14 14 14 14   % of time active ? 70% 74% 55% 71% 87%  Mean Glucose (mg/dL)  952 841 324  401  GMI  7.8%  7.7% 7.7%  Glycemic Variability (%CV) ?36% 47.2% 54% 49% 53.2%  Time above >250 mg/dL <0% 27% 25% 36% 64%  Time above 70-180 mg/dL <40% 34% 74% 25% 95%  Time in range: 70-180 mg/dL >63% 87% 56% 43% 32%  Time below 70 mg/dL <9% 5% 3% 3% 7%  Time below 54 mg/dL <5% 0% 0% 0% 0%  -Dietary Patterns: eating two meals a day. Snacking on sandwich twice daily.  -CGM report shows patient continue to note significant glycemic variability, with 7% below 70.  -DECREASE Tresiba to 39 units daily   Anxiety (Goal: Maintain stable mood and sleep) -Controlled -Current treatment: Alprazolam 2 mg three times daily as needed - Sleep  -Medications previously tried/failed: NA -PHQ9: 0 -GAD7: 5 -Continue current medications   Renal Transplant  (Goal: prevent rejection of kidney ) -Managed by Dr. Cherylann Ratel  -Controlled -Current treatment  Mycophenolate 500 mg 2 tablets twice daily  Tacrolimus 1 mg 3 capsules twice daily  -Medications previously tried: Myfortic (cost) -Recommended to continue current medication  Tobacco use (Goal Quit smoking) -Uncontrolled -Previous quit attempts: Chantix (nightmares), nicotine gum (ulcers), nicotine patch (stickiness) -Current treatment  17-18 cigarettes daily  -Patient smokes Within 30 minutes of waking -Patient triggers include: stress and finishing a meal -Patient was unwilling to discuss smoking cessation during today's  visit.  -Continue working to cut down on cigarette use.   Chronic Kidney Disease Stage 3a  -All medications assessed for renal dosing and appropriateness in chronic kidney disease. -Recommended to continue current medication  [name], PharmD, Patsy Baltimore, [name]  Clinical Pharmacist Practitioner  [facility] [phone]

## 2022-07-18 MED ORDER — OXYCODONE HCL 5 MG PO TABS: 5.0000 mg | ORAL_TABLET | Freq: Four times a day (QID) | ORAL | 0 refills | Status: DC | PRN

## 2022-07-23 ENCOUNTER — Other Ambulatory Visit: Payer: Self-pay | Admitting: Family Medicine

## 2022-07-23 NOTE — Telephone Encounter (Signed)
Please advise 

## 2022-08-06 ENCOUNTER — Ambulatory Visit: Payer: Medicare HMO | Admitting: Family Medicine

## 2022-08-06 ENCOUNTER — Ambulatory Visit: Payer: Medicare HMO | Admitting: Podiatry

## 2022-08-08 DIAGNOSIS — N2581 Secondary hyperparathyroidism of renal origin: Secondary | ICD-10-CM | POA: Diagnosis not present

## 2022-08-08 DIAGNOSIS — E1169 Type 2 diabetes mellitus with other specified complication: Secondary | ICD-10-CM | POA: Diagnosis not present

## 2022-08-08 DIAGNOSIS — I152 Hypertension secondary to endocrine disorders: Secondary | ICD-10-CM | POA: Diagnosis not present

## 2022-08-08 DIAGNOSIS — E785 Hyperlipidemia, unspecified: Secondary | ICD-10-CM | POA: Diagnosis not present

## 2022-08-08 DIAGNOSIS — Z794 Long term (current) use of insulin: Secondary | ICD-10-CM | POA: Diagnosis not present

## 2022-08-08 DIAGNOSIS — E1159 Type 2 diabetes mellitus with other circulatory complications: Secondary | ICD-10-CM | POA: Diagnosis not present

## 2022-08-08 DIAGNOSIS — E1129 Type 2 diabetes mellitus with other diabetic kidney complication: Secondary | ICD-10-CM | POA: Diagnosis not present

## 2022-08-08 LAB — HEMOGLOBIN A1C: Hemoglobin A1C: 7.5

## 2022-08-13 ENCOUNTER — Other Ambulatory Visit: Payer: Self-pay | Admitting: Family Medicine

## 2022-08-13 DIAGNOSIS — M79604 Pain in right leg: Secondary | ICD-10-CM

## 2022-08-13 DIAGNOSIS — L905 Scar conditions and fibrosis of skin: Secondary | ICD-10-CM

## 2022-08-13 NOTE — Telephone Encounter (Signed)
Medication Refill - Medication: Oxycodone 5  Has the patient contacted their pharmacy? Yes.   (Agent: If no, request that the patient contact the pharmacy for the refill. If patient does not wish to contact the pharmacy document the reason why and proceed with request.) (Agent: If yes, when and what did the pharmacy advise?)  Preferred Pharmacy (with phone number or street name): [facility] [address] Has the patient been seen for an appointment in the last year OR does the patient have an upcoming appointment? Yes.    Agent: Please be advised that RX refills may take up to 3 business days. We ask that you follow-up with your pharmacy.

## 2022-08-14 NOTE — Telephone Encounter (Signed)
Requested medication (s) are due for refill today -yes  Requested medication (s) are on the active medication list -yes  Future visit scheduled -yes  Last refill: 07/18/22 #240  Notes to clinic: non delegated Rx  Requested Prescriptions  Pending Prescriptions Disp Refills   oxyCODONE (OXY IR/ROXICODONE) 5 MG immediate release tablet 240 tablet 0    Sig: Take 1-2 tablets (5-10 mg total) by mouth every 6 (six) hours as needed for severe pain.     Not Delegated - Analgesics:  Opioid Agonists Failed - 08/14/2022  8:26 AM      Failed - This refill cannot be delegated      Failed - Urine Drug Screen completed in last 360 days      Failed - Valid encounter within last 3 months    Recent Outpatient Visits           6 months ago Urinary hesitancy   [place] [facility] [name], MD   1 year ago Primary hypertension   [place] [facility] [name], MD   1 year ago Type 2 diabetes mellitus with diabetic nephropathy, without long-term current use of insulin (HCC)   [place] [facility] [name], MD   2 years ago Type 2 diabetes mellitus with diabetic nephropathy, without long-term current use of insulin (HCC)   [place] [facility] [name], MD   2 years ago Type 2 diabetes mellitus with diabetic nephropathy, with long-term current use of insulin (HCC)   [place] [facility] [name], MD       Future Appointments             In 1 week Fisher, Demetrios Isaacs, MD [facility], [facility]               Requested Prescriptions  Pending Prescriptions Disp Refills   oxyCODONE (OXY IR/ROXICODONE) 5 MG immediate release tablet 240 tablet 0    Sig: Take 1-2 tablets (5-10 mg total) by mouth every 6 (six) hours as needed for severe pain.     Not Delegated - Analgesics:  Opioid Agonists Failed - 08/14/2022  8:26 AM      Failed - This refill  cannot be delegated      Failed - Urine Drug Screen completed in last 360 days      Failed - Valid encounter within last 3 months    Recent Outpatient Visits           6 months ago Urinary hesitancy   [place] [facility] [name], MD   1 year ago Primary hypertension   [place] [facility] [name], MD   1 year ago Type 2 diabetes mellitus with diabetic nephropathy, without long-term current use of insulin (HCC)   [place] [facility] [name], MD   2 years ago Type 2 diabetes mellitus with diabetic nephropathy, without long-term current use of insulin (HCC)   [place] [facility] [name], MD   2 years ago Type 2 diabetes mellitus with diabetic nephropathy, with long-term current use of insulin (HCC)    [facility] [name], MD       Future Appointments             In 1 week Fisher, Demetrios Isaacs, MD [facility]

## 2022-08-15 MED ORDER — OXYCODONE HCL 5 MG PO TABS: 5.0000 mg | ORAL_TABLET | Freq: Four times a day (QID) | ORAL | 0 refills | Status: DC | PRN

## 2022-08-21 DIAGNOSIS — E1129 Type 2 diabetes mellitus with other diabetic kidney complication: Secondary | ICD-10-CM | POA: Diagnosis not present

## 2022-08-27 ENCOUNTER — Ambulatory Visit: Payer: Medicare HMO | Admitting: Family Medicine

## 2022-08-27 ENCOUNTER — Ambulatory Visit: Admission: RE | Admit: 2022-08-27 | Payer: Medicare HMO | Source: Ambulatory Visit

## 2022-08-29 ENCOUNTER — Other Ambulatory Visit: Payer: Self-pay

## 2022-08-29 ENCOUNTER — Ambulatory Visit: Admission: RE | Admit: 2022-08-29 | Payer: Medicare HMO | Source: Ambulatory Visit

## 2022-08-29 ENCOUNTER — Other Ambulatory Visit: Payer: Self-pay | Admitting: Family Medicine

## 2022-08-29 DIAGNOSIS — Z87891 Personal history of nicotine dependence: Secondary | ICD-10-CM

## 2022-08-29 DIAGNOSIS — F17210 Nicotine dependence, cigarettes, uncomplicated: Secondary | ICD-10-CM

## 2022-08-29 DIAGNOSIS — Z122 Encounter for screening for malignant neoplasm of respiratory organs: Secondary | ICD-10-CM

## 2022-09-04 ENCOUNTER — Other Ambulatory Visit: Payer: Self-pay

## 2022-09-04 DIAGNOSIS — Z122 Encounter for screening for malignant neoplasm of respiratory organs: Secondary | ICD-10-CM

## 2022-09-04 DIAGNOSIS — F17210 Nicotine dependence, cigarettes, uncomplicated: Secondary | ICD-10-CM

## 2022-09-04 DIAGNOSIS — Z87891 Personal history of nicotine dependence: Secondary | ICD-10-CM

## 2022-09-10 ENCOUNTER — Other Ambulatory Visit: Payer: Self-pay | Admitting: Family Medicine

## 2022-09-10 ENCOUNTER — Ambulatory Visit: Payer: Medicare HMO

## 2022-09-10 DIAGNOSIS — A630 Anogenital (venereal) warts: Secondary | ICD-10-CM | POA: Diagnosis not present

## 2022-09-10 DIAGNOSIS — R52 Pain, unspecified: Secondary | ICD-10-CM

## 2022-09-10 DIAGNOSIS — M79604 Pain in right leg: Secondary | ICD-10-CM

## 2022-09-10 DIAGNOSIS — Z85828 Personal history of other malignant neoplasm of skin: Secondary | ICD-10-CM | POA: Diagnosis not present

## 2022-09-10 DIAGNOSIS — D2262 Melanocytic nevi of left upper limb, including shoulder: Secondary | ICD-10-CM | POA: Diagnosis not present

## 2022-09-10 DIAGNOSIS — D2261 Melanocytic nevi of right upper limb, including shoulder: Secondary | ICD-10-CM | POA: Diagnosis not present

## 2022-09-10 DIAGNOSIS — L821 Other seborrheic keratosis: Secondary | ICD-10-CM | POA: Diagnosis not present

## 2022-09-10 DIAGNOSIS — Z08 Encounter for follow-up examination after completed treatment for malignant neoplasm: Secondary | ICD-10-CM | POA: Diagnosis not present

## 2022-09-10 DIAGNOSIS — D2271 Melanocytic nevi of right lower limb, including hip: Secondary | ICD-10-CM | POA: Diagnosis not present

## 2022-09-10 DIAGNOSIS — D225 Melanocytic nevi of trunk: Secondary | ICD-10-CM | POA: Diagnosis not present

## 2022-09-10 DIAGNOSIS — D2272 Melanocytic nevi of left lower limb, including hip: Secondary | ICD-10-CM | POA: Diagnosis not present

## 2022-09-10 DIAGNOSIS — L738 Other specified follicular disorders: Secondary | ICD-10-CM | POA: Diagnosis not present

## 2022-09-10 DIAGNOSIS — E782 Mixed hyperlipidemia: Secondary | ICD-10-CM

## 2022-09-10 NOTE — Telephone Encounter (Signed)
Medication Refill - Medication: oxyCODONE (OXY IR/ROXICODONE) 5 MG immediate release tablet   Has the patient contacted their pharmacy? No. No, more refills.  (Agent: If no, request that the patient contact the pharmacy for the refill. If patient does not wish to contact the pharmacy document the reason why and proceed with request.)   Preferred Pharmacy (with phone number or street name):  [facility] [address], [place] - [address]  [address] [place] [address]  Phone: [phone] Fax: [phone]  Hours: Not open 24 hours   Has the patient been seen for an appointment in the last year OR does the patient have an upcoming appointment? Yes.    Agent: Please be advised that RX refills may take up to 3 business days. We ask that you follow-up with your pharmacy.

## 2022-09-11 NOTE — Telephone Encounter (Signed)
Requested medication (s) are due for refill today - yes  Requested medication (s) are on the active medication list -yes  Future visit scheduled -yes  Last refill: 08/15/22 #240  Notes to clinic: non delegated Rx  Requested Prescriptions  Pending Prescriptions Disp Refills   oxyCODONE (OXY IR/ROXICODONE) 5 MG immediate release tablet 240 tablet 0    Sig: Take 1-2 tablets (5-10 mg total) by mouth every 6 (six) hours as needed for severe pain.     Not Delegated - Analgesics:  Opioid Agonists Failed - 09/10/2022  3:58 PM      Failed - This refill cannot be delegated      Failed - Urine Drug Screen completed in last 360 days      Failed - Valid encounter within last 3 months    Recent Outpatient Visits           7 months ago Urinary hesitancy   [place] [facility] [name], MD   1 year ago Primary hypertension   [place] [facility] [name], MD   1 year ago Type 2 diabetes mellitus with diabetic nephropathy, without long-term current use of insulin (HCC)   [place] [facility] [name], MD   2 years ago Type 2 diabetes mellitus with diabetic nephropathy, without long-term current use of insulin (HCC)   [place] [facility] [name], MD   2 years ago Type 2 diabetes mellitus with diabetic nephropathy, with long-term current use of insulin (HCC)   [place] [facility] [name], MD       Future Appointments             In 3 days Fisher, Demetrios Isaacs, MD [facility], [facility]               Requested Prescriptions  Pending Prescriptions Disp Refills   oxyCODONE (OXY IR/ROXICODONE) 5 MG immediate release tablet 240 tablet 0    Sig: Take 1-2 tablets (5-10 mg total) by mouth every 6 (six) hours as needed for severe pain.     Not Delegated - Analgesics:  Opioid Agonists Failed - 09/10/2022  3:58 PM      Failed - This refill  cannot be delegated      Failed - Urine Drug Screen completed in last 360 days      Failed - Valid encounter within last 3 months    Recent Outpatient Visits           7 months ago Urinary hesitancy   [place] [facility] [name], MD   1 year ago Primary hypertension   [facility] [name], MD   1 year ago Type 2 diabetes mellitus with diabetic nephropathy, without long-term current use of insulin (HCC)   [place] [facility] [name], MD   2 years ago Type 2 diabetes mellitus with diabetic nephropathy, without long-term current use of insulin (HCC)   [place] [facility] [name], MD   2 years ago Type 2 diabetes mellitus with diabetic nephropathy, with long-term current use of insulin (HCC)    [facility] [name], MD       Future Appointments             In 3 days Fisher, Demetrios Isaacs, MD [facility]

## 2022-09-12 MED ORDER — OXYCODONE HCL 5 MG PO TABS
5.0000 mg | ORAL_TABLET | Freq: Four times a day (QID) | ORAL | 0 refills | Status: DC | PRN
Start: 2022-09-12 — End: 2022-10-08

## 2022-09-14 ENCOUNTER — Ambulatory Visit (INDEPENDENT_AMBULATORY_CARE_PROVIDER_SITE_OTHER): Payer: Medicare HMO | Admitting: Family Medicine

## 2022-09-14 ENCOUNTER — Encounter: Payer: Self-pay | Admitting: Family Medicine

## 2022-09-14 VITALS — BP 122/72 | HR 81 | Temp 97.8°F | Resp 16 | Wt 214.9 lb

## 2022-09-14 DIAGNOSIS — Z794 Long term (current) use of insulin: Secondary | ICD-10-CM | POA: Diagnosis not present

## 2022-09-14 DIAGNOSIS — E1121 Type 2 diabetes mellitus with diabetic nephropathy: Secondary | ICD-10-CM | POA: Diagnosis not present

## 2022-09-14 DIAGNOSIS — R3911 Hesitancy of micturition: Secondary | ICD-10-CM | POA: Diagnosis not present

## 2022-09-14 DIAGNOSIS — R52 Pain, unspecified: Secondary | ICD-10-CM | POA: Diagnosis not present

## 2022-09-14 DIAGNOSIS — L905 Scar conditions and fibrosis of skin: Secondary | ICD-10-CM | POA: Diagnosis not present

## 2022-09-14 DIAGNOSIS — I10 Essential (primary) hypertension: Secondary | ICD-10-CM

## 2022-09-14 NOTE — Progress Notes (Signed)
I,Sulibeya S Dimas,acting as a scribe for Mila Merry, MD.,have documented all relevant documentation on the behalf of Mila Merry, MD,as directed by  Mila Merry, MD    Established patient visit   Patient: James Moreno   DOB: [birth year]   57 y.o. Male  MRN: [id number] Visit Date: 09/14/2022  Today's healthcare provider: Mila Merry, MD   Chief Complaint  Patient presents with   Follow-up   Subjective    HPI  Patient reports he had a kidney transplant. Patient reports he is having difficulty urinating, especially when he gets up in the morning. He was previously prescribed tamsulosin which he states did help for awhile, but sx are getting progressively worse again. He denies any painful urination or blood in his urine. He reports he has an appt with kidney specialist next week.   Follow up for chronic pain  The patient was last seen for this 6 months ago. Changes made at last visit include no changes.  He reports excellent compliance with treatment. He feels that condition is Unchanged. He is not having side effects.   ----------------------------------------------------------------------------------------- Anxiety, Follow-up  He was last seen for anxiety 6 months ago. Changes made at last visit include no changes.   He reports excellent compliance with treatment. He reports excellent tolerance of treatment. He is not having side effects.   He feels his anxiety is mild and Improved since last visit.   GAD-7 Results    10/24/2021    2:44 PM  GAD-7 Generalized Anxiety Disorder Screening Tool  1. Feeling Nervous, Anxious, or on Edge 2  2. Not Being Able to Stop or Control Worrying 0  3. Worrying Too Much About Different Things 0  4. Trouble Relaxing 1  5. Being So Restless it's Hard To Sit Still 1  6. Becoming Easily Annoyed or Irritable 1  7. Feeling Afraid As If Something Awful Might Happen 0  Total GAD-7 Score 5  Difficulty At Work, Home, or Getting   Along With Others? Somewhat difficult    PHQ-9 Scores    09/14/2022   10:39 AM 03/01/2022    1:00 PM 02/28/2022   10:56 AM  PHQ9 SCORE ONLY  PHQ-9 Total Score 0 1 1    ---------------------------------------------------------------------------------------------------   Medications: Outpatient Medications Prior to Visit  Medication Sig   alprazolam (XANAX) 2 MG tablet TAKE 1 TABLET BY MOUTH THREE TIMES DAILY AS NEEDED FOR ANXIETY   amLODipine (NORVASC) 10 MG tablet Take 10 mg by mouth daily.    atorvastatin (LIPITOR) 80 MG tablet Take 1 tablet by mouth once daily   BD INSULIN SYRINGE U/F 31G X 5/16" 1 ML MISC    calcitRIOL (ROCALTROL) 0.25 MCG capsule Take 1 capsule (0.25 mcg total) by mouth daily.   carvedilol (COREG) 12.5 MG tablet Take 1.5 tablets (18.75 mg total) by mouth 2 (two) times daily.   Cetirizine HCl 10 MG CAPS Take 5 mg by mouth daily.   dapagliflozin propanediol ([name]) 10 MG TABS tablet Take 1 tablet (10 mg total) by mouth daily before breakfast. Patient receives through [name] through Dec 2023   insulin aspart (NOVOLOG) 100 UNIT/ML injection Inject 15 Units into the skin 3 (three) times daily before meals.   insulin degludec (TRESIBA FLEXTOUCH) 100 UNIT/ML FlexTouch Pen Inject 39 Units into the skin daily. Patient receives via [name] Patient Assistance through Dec 2023   losartan (COZAAR) 100 MG tablet Take 1 tablet (100 mg total) by mouth at bedtime.   mycophenolate (CELLCEPT)  500 MG tablet Take 1,000 mg by mouth 2 (two) times daily.   naloxone (NARCAN) nasal spray 4 mg/0.1 mL Place 1 spray into the nose once.   omeprazole (PRILOSEC) 20 MG capsule Take 20 mg by mouth daily as needed.   oxyCODONE (OXY IR/ROXICODONE) 5 MG immediate release tablet Take 1-2 tablets (5-10 mg total) by mouth every 6 (six) hours as needed for severe pain.   Semaglutide,0.25 or 0.5MG /DOS, (OZEMPIC, 0.25 OR 0.5 MG/DOSE,) 2 MG/3ML SOPN Inject 0.5 mg into the skin once a week. Patient  receives via [name] Patient Assistance   sildenafil (REVATIO) 20 MG tablet    tacrolimus (PROGRAF) 1 MG capsule Take 3 mg by mouth 2 (two) times daily.    tamsulosin (FLOMAX) 0.4 MG CAPS capsule Take 1 capsule (0.4 mg total) by mouth daily.   tretinoin (RETIN-A) 0.05 % cream Apply topically as needed.    triamcinolone cream (KENALOG) 0.1 % APPLY DAILY TO INFLAMED BUMPS AS NEEDED   XYOSTED 50 MG/0.5ML SOAJ    EDEX 40 MCG injection 40 mcg by Intracavitary route as needed.   No facility-administered medications prior to visit.    Review of Systems  Respiratory:  Negative for chest tightness and shortness of breath.   Cardiovascular:  Negative for chest pain and leg swelling.  Genitourinary:  Positive for difficulty urinating. Negative for hematuria.       Objective    BP 122/72 (BP Location: Left Arm, Patient Position: Sitting, Cuff Size: Large)   Pulse 81   Temp 97.8 F (36.6 C) (Temporal)   Resp 16   Wt 214 lb 14.4 oz (97.5 kg)   SpO2 96%   BMI 29.97 kg/m    Physical Exam  General appearance: Well developed, well nourished male, cooperative and in no acute distress Head: Normocephalic, without obvious abnormality, atraumatic Respiratory: Respirations even and unlabored, normal respiratory rate Extremities: All extremities are intact.  Skin: Skin color, texture, turgor normal. No rashes seen  Psych: Appropriate mood and affect. Neurologic: Mental status: Alert, oriented to person, place, and time, thought content appropriate.   Assessment & Plan     1. Urinary hesitancy Initial improvement with tamsulosin, but getting worse again.  Lab Results  Component Value Date   PSA1 0.2 02/12/2022   PSA1 0.2 06/20/2020   PSA1 0.2 01/27/2019  May increase tamsulosin to 2 QD - Ambulatory referral to Urology  2. Pain in surgical scar Chronic pain well controlled. He was unable to provider urine sample today, was also unable at last visit. Advised to follow up in 2-3  months, but will not continue to refill controlled medications if he does not provide sample for DS by then.   3. Type 2 diabetes mellitus with diabetic nephropathy, with long-term current use of insulin (HCC) Now followed by Dr. Gershon Crane, reviewed outside records.   4. Benign essential hypertension Well controlled.  Continue current medications.       The entirety of the information documented in the History of Present Illness, Review of Systems and Physical Exam were personally obtained by me. Portions of this information were initially documented by the CMA and reviewed by me for thoroughness and accuracy.     Mila Merry, MD  [facility] [phone] (phone) [phone] (fax)  [facility]

## 2022-09-16 ENCOUNTER — Other Ambulatory Visit: Payer: Self-pay | Admitting: Cardiovascular Disease

## 2022-09-24 ENCOUNTER — Encounter: Payer: Self-pay | Admitting: Pharmacist

## 2022-09-24 ENCOUNTER — Telehealth: Payer: Self-pay

## 2022-09-24 ENCOUNTER — Other Ambulatory Visit: Payer: Self-pay

## 2022-09-24 ENCOUNTER — Other Ambulatory Visit: Payer: Self-pay | Admitting: Pharmacist

## 2022-09-24 DIAGNOSIS — E1140 Type 2 diabetes mellitus with diabetic neuropathy, unspecified: Secondary | ICD-10-CM

## 2022-09-24 MED ORDER — DAPAGLIFLOZIN PROPANEDIOL 10 MG PO TABS
10.0000 mg | ORAL_TABLET | Freq: Every day | ORAL | 0 refills | Status: DC
Start: 2022-09-24 — End: 2022-10-23

## 2022-09-24 NOTE — Patient Instructions (Signed)
Goals Addressed             This Visit's Progress    Pharmacy Goals       If you need to get in touch with patient assistance programs in the future regarding refills, you can contact: [employer] at [phone] regarding refills of Ozempic, Evaristo Bury and Novolog - [name] at [phone] regarding refills of [name]   I asked your doctor to send a 30-day supply of your [name] to the pharmacy for you today. If the [name] does not arrive in time from [name], you can follow up with [facility] to pick up this supply through your insurance    (I tried to use that 30-day trial card for you today, but the pharmacy said that it did not go through- may have already been used in the past).    Thank you!   Estelle Grumbles, PharmD, [facility] [phone]

## 2022-09-24 NOTE — Telephone Encounter (Signed)
Copied from CRM [id number]. Topic: General - Other >> Sep 24, 2022 11:41 AM Franchot Heidelberg wrote: Reason for CRM: Pt called to leave a message for Trinna Post and Mickie Bail, says he is out of his [name]. [facility] [address] [place] - [address] [place] [address] Phone: [phone] Fax: [phone]

## 2022-09-24 NOTE — Progress Notes (Signed)
   09/24/2022  Patient ID: James Moreno, male   DOB: May 30, 1965, 57 y.o.   MRN: [id number]  Received message from PCP office requesting outreach as patient has not yet received his [name] from [name] patient assistance program. Note patient previously worked with [name] for enrollment in [place] patient assistance program, but patient reports only has a couple of days of [name] remaining.  Outreach to [name] on behalf of patient today. Speak with representative [name], who is able to confirm patient has active enrollment for [name] assistance through 03/26/2023. Confirms active prescription on file and should be shipped to patient within 10-14 business days.  - Request shipment be expedited as patient only has a couple of days of medication remaining. Representative states that she will place this request, but unable to confirm if will be able to ship sooner.  Collaborate with PCP to request provider send a prescription for 30-day supply of [name] to pharmacy for patient Follow up with [name]. Find patient not eligible for 30-day trial copay card; note limit one use per lifetime   Attempt to provide patient with update, but unable to reach him again. Send information via MyChart message  Patient reported that he receives Ozempic, [name] and Novolog from [name] patient assistance program. Reports that he currently has a good supply of these medications.   Plan:   1) Will send patient a MyChart message with patient assistance program contact information  2) Clinical Pharmacist will outreach to patient by telephone again on 10/31/2022 at 10:00 AM for initial visit.  Estelle Grumbles, PharmD, [facility] [phone]

## 2022-10-08 ENCOUNTER — Other Ambulatory Visit: Payer: Self-pay | Admitting: Family Medicine

## 2022-10-08 DIAGNOSIS — L905 Scar conditions and fibrosis of skin: Secondary | ICD-10-CM

## 2022-10-08 DIAGNOSIS — M79604 Pain in right leg: Secondary | ICD-10-CM

## 2022-10-08 NOTE — Telephone Encounter (Unsigned)
Copied from CRM [id number]. Topic: General - Other >> Oct 08, 2022  4:33 PM Everette C wrote: Reason for CRM: Medication Refill - Medication: oxyCODONE (OXY IR/ROXICODONE) 5 MG immediate release tablet [[id number]]  Has the patient contacted their pharmacy? Yes.   (Agent: If no, request that the patient contact the pharmacy for the refill. If patient does not wish to contact the pharmacy document the reason why and proceed with request.) (Agent: If yes, when and what did the pharmacy advise?)  Preferred Pharmacy (with phone number or street name): [facility] [address] [place] - [address] [place] [address] Phone: [phone] Fax: [phone] Hours: Not open 24 hours   Has the patient been seen for an appointment in the last year OR does the patient have an upcoming appointment? Yes.    Agent: Please be advised that RX refills may take up to 3 business days. We ask that you follow-up with your pharmacy.

## 2022-10-09 NOTE — Telephone Encounter (Signed)
Requested medications are due for refill today.  unsure  Requested medications are on the active medications list.  yes  Last refill. 09/12/2022 #240 0 rf  Future visit scheduled.   yes  Notes to clinic.  Refill not delegated.    Requested Prescriptions  Pending Prescriptions Disp Refills   oxyCODONE (OXY IR/ROXICODONE) 5 MG immediate release tablet 240 tablet 0    Sig: Take 1-2 tablets (5-10 mg total) by mouth every 6 (six) hours as needed for severe pain.     Not Delegated - Analgesics:  Opioid Agonists Failed - 10/08/2022  5:10 PM      Failed - This refill cannot be delegated      Failed - Urine Drug Screen completed in last 360 days      Passed - Valid encounter within last 3 months    Recent Outpatient Visits           3 weeks ago Urinary hesitancy   [place] [facility] [name], MD   7 months ago Urinary hesitancy   [place] [facility] [name], MD   1 year ago Primary hypertension   [place] [facility] [name], MD   1 year ago Type 2 diabetes mellitus with diabetic nephropathy, without long-term current use of insulin (HCC)   [place] [facility] [name], MD   2 years ago Type 2 diabetes mellitus with diabetic nephropathy, without long-term current use of insulin (HCC)    [facility] Sherrie Mustache, Demetrios Isaacs, MD       Future Appointments             In 3 weeks Stoioff, Verna Czech, MD [facility] [place]   In 2 months Fisher, Demetrios Isaacs, MD [facility]

## 2022-10-10 MED ORDER — OXYCODONE HCL 5 MG PO TABS
5.0000 mg | ORAL_TABLET | Freq: Four times a day (QID) | ORAL | 0 refills | Status: DC | PRN
Start: 2022-10-10 — End: 2022-11-08

## 2022-10-12 ENCOUNTER — Telehealth: Payer: Self-pay

## 2022-10-12 NOTE — Telephone Encounter (Signed)
   CCM RN Visit Note   10/12/22 Name: James Moreno MRN: [id number]      DOB: 04-15-1965  Subjective: James Moreno is a 57 y.o. year old male who is a primary care patient of Sherrie Mustache, Demetrios Isaacs, MD. The patient was referred to the Chronic Care Management team for assistance with care management needs subsequent to provider initiation of CCM services and plan of care.      An unsuccessful telephone outreach was attempted today to contact the patient about Chronic Care Management needs.    PLAN: A HIPAA compliant phone message was left for the patient providing contact information and requesting a return call.   [facility]/Chronic Care Management [phone]

## 2022-10-17 ENCOUNTER — Other Ambulatory Visit: Payer: Self-pay | Admitting: Cardiovascular Disease

## 2022-10-18 ENCOUNTER — Telehealth: Payer: Self-pay | Admitting: Family Medicine

## 2022-10-18 ENCOUNTER — Telehealth: Payer: Self-pay

## 2022-10-18 ENCOUNTER — Other Ambulatory Visit: Payer: Self-pay

## 2022-10-18 DIAGNOSIS — E782 Mixed hyperlipidemia: Secondary | ICD-10-CM

## 2022-10-18 DIAGNOSIS — R3911 Hesitancy of micturition: Secondary | ICD-10-CM

## 2022-10-18 MED ORDER — ATORVASTATIN CALCIUM 80 MG PO TABS
80.0000 mg | ORAL_TABLET | Freq: Every day | ORAL | 1 refills | Status: DC
Start: 2022-10-18 — End: 2023-02-22

## 2022-10-18 MED ORDER — TAMSULOSIN HCL 0.4 MG PO CAPS
0.4000 mg | ORAL_CAPSULE | Freq: Every day | ORAL | 1 refills | Status: DC
Start: 2022-10-18 — End: 2023-02-22

## 2022-10-18 NOTE — Telephone Encounter (Signed)
   CCM RN Visit Note   10/18/22 Name: James Moreno MRN: [id number]      DOB: [birth year]  Subjective: James Moreno is a 57 y.o. year old male who is a primary care patient of Sherrie Mustache, Demetrios Isaacs, MD. The patient was referred to the Chronic Care Management team for assistance with care management needs subsequent to provider initiation of CCM services and plan of care.      An unsuccessful outreach was attempted today.   PLAN: A HIPAA compliant phone message was left for the patient providing contact information and requesting a return call.   [facility]Chronic Care Management [phone]

## 2022-10-18 NOTE — Telephone Encounter (Signed)
[facility] faxed refill request for the following medications:   alprazolam (XANAX) 2 MG tablet    tamsulosin (FLOMAX) 0.4 MG CAPS capsule    atorvastatin (LIPITOR) 80 MG tablet   Please advise.

## 2022-10-22 ENCOUNTER — Other Ambulatory Visit: Payer: Self-pay | Admitting: Family Medicine

## 2022-10-22 DIAGNOSIS — Z794 Long term (current) use of insulin: Secondary | ICD-10-CM

## 2022-10-23 NOTE — Telephone Encounter (Signed)
Requested medications are due for refill today.  yes  Requested medications are on the active medications list.  yes  Last refill. 09/24/2022 #30 0  Future visit scheduled.   yes  Notes to clinic.  Abnormal labs - please review for refill.    Requested Prescriptions  Pending Prescriptions Disp Refills   [name] 10 MG TABS tablet [Pharmacy Med Name: [name] 10 MG Oral Tablet] 30 tablet 0    Sig: TAKE 1 TABLET BY MOUTH BEFORE BREAKFAST     Endocrinology:  Diabetes - SGLT2 Inhibitors Failed - 10/22/2022  6:41 PM      Failed - Cr in normal range and within 360 days    Creatinine  Date Value Ref Range Status  06/21/2013 9.88 (H) 0.60 - 1.30 mg/dL Final   Creatinine, Ser  Date Value Ref Range Status  02/12/2022 2.81 (H) 0.76 - 1.27 mg/dL Final         Failed - eGFR in normal range and within 360 days    EGFR (African American)  Date Value Ref Range Status  06/21/2013 6 (L)  Final   GFR calc Af Amer  Date Value Ref Range Status  09/17/2019 45 (L) >59 mL/min/1.73 Final    Comment:    **[facility] currently reports eGFR in compliance with the current**   recommendations of the [employer]. [facility] will   update reporting as new guidelines are published from the NKF-ASN   Task force.    EGFR (Non-African Amer.)  Date Value Ref Range Status  06/21/2013 6 (L)  Final    Comment:    eGFR values <84mL/min/1.73 m2 may be an indication of chronic kidney disease (CKD). Calculated eGFR is useful in patients with stable renal function. The eGFR calculation will not be reliable in acutely ill patients when serum creatinine is changing rapidly. It is not useful in  patients on dialysis. The eGFR calculation may not be applicable to patients at the low and high extremes of body sizes, pregnant women, and vegetarians.    GFR calc non Af Amer  Date Value Ref Range Status  09/17/2019 39 (L) >59 mL/min/1.73 Final   eGFR  Date Value Ref Range Status  02/12/2022 26 (L) >59  mL/min/1.73 Final         Passed - HBA1C is between 0 and 7.9 and within 180 days    Hemoglobin A1C  Date Value Ref Range Status  08/08/2022 7.5  Final    Comment:    Gershon Crane         Passed - Valid encounter within last 6 months    Recent Outpatient Visits           1 month ago Urinary hesitancy   [place] [facility] [name], MD   8 months ago Urinary hesitancy   [place] [facility] [name], MD   1 year ago Primary hypertension   [place] [facility] [name], MD   2 years ago Type 2 diabetes mellitus with diabetic nephropathy, without long-term current use of insulin (HCC)   [place] [facility] [name], MD   2 years ago Type 2 diabetes mellitus with diabetic nephropathy, without long-term current use of insulin (HCC)    [facility] [name], MD       Future Appointments             In 1 week Stoioff, Verna Czech, MD [facility] [place]   In 1 month Fisher, Demetrios Isaacs, MD [facility]

## 2022-10-24 ENCOUNTER — Other Ambulatory Visit: Payer: Self-pay | Admitting: Family Medicine

## 2022-10-24 NOTE — Telephone Encounter (Signed)
Medication Refill - Medication: alprazolam Prudy Feeler) 2 MG tablet   This was submitted on the 29th via fax Clydie Braun says   Has the patient contacted their pharmacy? Yes.   (Agent: If no, request that the patient contact the pharmacy for the refill. If patient does not wish to contact the pharmacy document the reason why and proceed with request.) (Agent: If yes, when and what did the pharmacy advise?)  Preferred Pharmacy (with phone number or street name):  [facility] - [place], [place] - [address]  [address] [place] [address]  Phone: [phone] Fax: [phone]   Has the patient been seen for an appointment in the last year OR does the patient have an upcoming appointment? Yes.    Agent: Please be advised that RX refills may take up to 3 business days. We ask that you follow-up with your pharmacy.

## 2022-10-25 NOTE — Telephone Encounter (Signed)
Requested medication (s) are due for refill today:   Requested medication (s) are on the active medication list: Yes  Last refill:  08/30/22  Future visit scheduled: Yes  Notes to clinic:  Not delegated.    Requested Prescriptions  Pending Prescriptions Disp Refills   alprazolam (XANAX) 2 MG tablet 90 tablet 1    Sig: Take 1 tablet (2 mg total) by mouth 3 (three) times daily as needed. for anxiety     Not Delegated - Psychiatry: Anxiolytics/Hypnotics 2 Failed - 10/24/2022 12:22 PM      Failed - This refill cannot be delegated      Failed - Urine Drug Screen completed in last 360 days      Passed - Patient is not pregnant      Passed - Valid encounter within last 6 months    Recent Outpatient Visits           1 month ago Urinary hesitancy   [place] [facility] [name], MD   8 months ago Urinary hesitancy   [place] [facility] [name], MD   1 year ago Primary hypertension   [place] [facility] [name], MD   2 years ago Type 2 diabetes mellitus with diabetic nephropathy, without long-term current use of insulin (HCC)   [place] [facility] [name], MD   2 years ago Type 2 diabetes mellitus with diabetic nephropathy, without long-term current use of insulin (HCC)    [facility] Sherrie Mustache, Demetrios Isaacs, MD       Future Appointments             In 6 days Stoioff, Verna Czech, MD [facility] [place]   In 1 month Fisher, Demetrios Isaacs, MD [facility]

## 2022-10-26 ENCOUNTER — Other Ambulatory Visit: Payer: Self-pay | Admitting: Family Medicine

## 2022-10-26 ENCOUNTER — Other Ambulatory Visit: Payer: Self-pay | Admitting: Cardiovascular Disease

## 2022-10-26 DIAGNOSIS — M79604 Pain in right leg: Secondary | ICD-10-CM

## 2022-10-26 DIAGNOSIS — L905 Scar conditions and fibrosis of skin: Secondary | ICD-10-CM

## 2022-10-26 MED ORDER — ALPRAZOLAM 2 MG PO TABS: 2.0000 mg | ORAL_TABLET | Freq: Three times a day (TID) | ORAL | 1 refills | Status: DC | PRN

## 2022-10-26 NOTE — Telephone Encounter (Signed)
Requested medication (s) are due for refill today - no  Requested medication (s) are on the active medication list -yes  Future visit scheduled -yes  Last refill: 10/26/22 #90 1RF  Notes to clinic: Duplicate request- non delegated Rx  Requested Prescriptions  Pending Prescriptions Disp Refills   alprazolam (XANAX) 2 MG tablet [Pharmacy Med Name: ALPRAZolam 2 MG Oral Tablet] 90 tablet 0    Sig: TAKE 1 TABLET BY MOUTH THREE TIMES DAILY AS NEEDED FOR ANXIETY     Not Delegated - Psychiatry: Anxiolytics/Hypnotics 2 Failed - 10/26/2022  2:04 PM      Failed - This refill cannot be delegated      Failed - Urine Drug Screen completed in last 360 days      Passed - Patient is not pregnant      Passed - Valid encounter within last 6 months    Recent Outpatient Visits           1 month ago Urinary hesitancy   [place] [facility] [name], MD   8 months ago Urinary hesitancy   [place] [facility] [name], MD   1 year ago Primary hypertension   [place] [facility] [name], MD   2 years ago Type 2 diabetes mellitus with diabetic nephropathy, without long-term current use of insulin (HCC)   [place] [facility] [name], MD   2 years ago Type 2 diabetes mellitus with diabetic nephropathy, without long-term current use of insulin (HCC)   [place] [facility] Sherrie Mustache, Demetrios Isaacs, MD       Future Appointments             In 5 days Stoioff, Verna Czech, MD [facility] [place]   In 1 month Fisher, Demetrios Isaacs, MD [facility]               Requested Prescriptions  Pending Prescriptions Disp Refills   alprazolam Prudy Feeler) 2 MG tablet [Pharmacy Med Name: ALPRAZolam 2 MG Oral Tablet] 90 tablet 0    Sig: TAKE 1 TABLET BY MOUTH THREE TIMES DAILY AS NEEDED FOR ANXIETY     Not Delegated - Psychiatry: Anxiolytics/Hypnotics 2 Failed -  10/26/2022  2:04 PM      Failed - This refill cannot be delegated      Failed - Urine Drug Screen completed in last 360 days      Passed - Patient is not pregnant      Passed - Valid encounter within last 6 months    Recent Outpatient Visits           1 month ago Urinary hesitancy   [place] [facility] [name], MD   8 months ago Urinary hesitancy   [place] [facility] [name], MD   1 year ago Primary hypertension   [place] [facility] [name], MD   2 years ago Type 2 diabetes mellitus with diabetic nephropathy, without long-term current use of insulin (HCC)    [facility] [name], MD   2 years ago Type 2 diabetes mellitus with diabetic nephropathy, without long-term current use of insulin (HCC)    [facility] [name], MD       Future Appointments             In 5 days [place], Lorin Picket  Sal[place] Saner, MD [facility] [place]   In 1 month Fisher, Demetrios Isaacs, MD [facility]

## 2022-10-31 ENCOUNTER — Other Ambulatory Visit: Payer: Medicare HMO | Admitting: Pharmacist

## 2022-10-31 ENCOUNTER — Encounter: Payer: Self-pay | Admitting: Urology

## 2022-10-31 ENCOUNTER — Ambulatory Visit: Payer: Medicare HMO | Admitting: Urology

## 2022-10-31 VITALS — Ht 72.0 in | Wt 214.0 lb

## 2022-10-31 DIAGNOSIS — N5201 Erectile dysfunction due to arterial insufficiency: Secondary | ICD-10-CM | POA: Diagnosis not present

## 2022-10-31 DIAGNOSIS — N529 Male erectile dysfunction, unspecified: Secondary | ICD-10-CM

## 2022-10-31 DIAGNOSIS — R399 Unspecified symptoms and signs involving the genitourinary system: Secondary | ICD-10-CM | POA: Diagnosis not present

## 2022-10-31 DIAGNOSIS — E1129 Type 2 diabetes mellitus with other diabetic kidney complication: Secondary | ICD-10-CM | POA: Diagnosis not present

## 2022-10-31 MED ORDER — SILDENAFIL CITRATE 100 MG PO TABS: ORAL_TABLET | ORAL | 0 refills | Status: DC

## 2022-10-31 NOTE — Progress Notes (Signed)
10/31/2022 Name: James Moreno MRN: [id number] DOB: 01-27-1966  Chief Complaint  Patient presents with   Medication Management   Medication Assistance    James Moreno is a 57 y.o. year old male who presented for a telephone visit.   They were referred to the pharmacist by their PCP for assistance in managing medication access.   Unable to review medications or other medical conditions today as priority is for patient to contact Endocrinology office now to discuss recent blood sugar readings/medication adjustment. Agree to complete medication review during our next telephone appointment  Subjective:  Care Team: Primary Care Provider: [name], MD ; Next Scheduled Visit: 12/17/2022 Endocrinologist Gershon Crane Dorothyann Gibbs, MD; Next Scheduled Visit: 12/19/2022 Urology: Riki Altes, MD; Next Scheduled Visit: Today Nephrologist: Mady Haagensen, MD  Medication Access/Adherence  Current Pharmacy:  [facility] [address] [place] - [address] [place] [address] Phone: [phone] Fax: [phone]  [facility] (ESRD Bundle Only) - [place], [place] - [address] [place] [address] Phone: [phone] Fax: [phone]  [facility] [phone] [place], [place] - [address] [place] [address] Phone: [phone] Fax: [phone]  [facility] - [place], [place] - [address] [place] [address] Phone: [phone] Fax: [phone]   Patient reports affordability concerns with their medications: No  Patient reports access/transportation concerns to their pharmacy: No  Patient reports adherence concerns with their medications:  No     Diabetes:  Current medications:  - Tresiba FlexTouch 25 units daily (reports reduced this dose ~2 weeks ago related to hypoglycemia) - Novolog 10 units before meals three time daily and  reports adjusting per sliding scale from Endocrinologist if blood sugar greater than 120 prior to meal - [name] 10 daily before breakfast - Ozempic 0.5 mg weekly  Patient using Freestyle Libre 2 Continuous Glucose Monitoring (CGM). Uses phone in place of reader device - Connect to patient using Freestyle [employer] [facility] today Date of Download: 10/31/22 - Past 14 days: % Time CGM is active: 87% Average Glucose: 119 mg/dL Glucose Management Indicator: 6.2%  Glucose Variability: 40.2% (goal <36%) Time in Goal:  - Time in range 70-180: 76% - Time above range: 13% - Time low: 11% - Time very low: 0%  Patient reports recent hypoglycemia, which he describes treating with success, but has not yet reported to Endocrinologist  Attributes change in blood sugar control to making significant changes to improve his diet and increase his activity   Current physical activity: Started going to gym to walk on treadmill and workout on machines for ~60 minutes x 3 days/week  Statin therapy: atorvastatin 80 mg daily  Current medication access support:  - Enrolled in Ozempic, Tresiba and Novolog assistance program from [name] and [place] patient assistance from [name]     Objective:  Lab Results  Component Value Date   HGBA1C 7.5 08/08/2022    Lab Results  Component Value Date   CREATININE 2.81 (H) 02/12/2022   BUN 36 (H) 02/12/2022   NA 130 (L) 02/12/2022   K 3.9 02/12/2022   CL 93 (L) 02/12/2022   CO2 20 02/12/2022    Lab Results  Component Value Date   CHOL 141 06/20/2020   HDL 46 06/20/2020   LDLCALC 53 06/20/2020   TRIG 268 (H) 06/20/2020   CHOLHDL 3.1 06/20/2020    Medications Reviewed Today  Reviewed by Manuela Neptune, RPH-[name] (Pharmacist) on 10/31/22 at 2210  Med List Status: <None>   Medication Order Taking? Sig Documenting Provider Last Dose Status Informant  alprazolam (XANAX) 2 MG tablet [id number]  Take 1 tablet (2 mg total) by mouth 3 (three) times daily  as needed. for anxiety [name], MD  Active   amLODipine (NORVASC) 10 MG tablet [id number]  Take 10 mg by mouth daily.  [provider]  Active            Med Note Constance Haw, [facility] N   Thu Mar 01, 2022 12:51 PM) Taking 10 mg in morning  atorvastatin (LIPITOR) 80 MG tablet [id number]  Take 1 tablet (80 mg total) by mouth daily. [name], MD  Active   BD INSULIN SYRINGE U/F 31G X 5/16" 1 ML MISC [id number]   [provider]  Active   calcitRIOL (ROCALTROL) 0.25 MCG capsule [id number]  Take 1 capsule (0.25 mcg total) by mouth daily. [name], MD  Active   carvedilol (COREG) 12.5 MG tablet [id number]  TAKE 1 AND 1/2 TABLETS TWICE DAILY Antonieta Iba, MD  Active   Cetirizine HCl 10 MG CAPS [id number]  Take 5 mg by mouth daily. [provider]  Active   [name] 10 MG TABS tablet [id number] Yes TAKE 1 TABLET BY MOUTH BEFORE BREAKFAST Fisher, Demetrios Isaacs, MD Taking Active   insulin aspart (NOVOLOG) 100 UNIT/ML injection [id number]  Inject 10 Units into the skin 3 (three) times daily before meals. and reports adjusting per sliding scale from Endocrinologist if blood sugar greater than 120 prior to [facility] [provider]  Active Self           Med Note Constance Haw, [employer] N   Thu Mar 01, 2022 12:42 PM) Reports receiving via [name]. Reports receiving assistance application via mail. Will submit  insulin degludec (TRESIBA FLEXTOUCH) 100 UNIT/ML FlexTouch Pen [id number]  Inject 39 Units into the skin daily. Patient receives via [name] Patient Assistance through Dec 2023  Patient taking differently: Inject 25 Units into the skin daily. Patient receives via [name] Patient Assistance through Dec 2023   [name], MD  Active   losartan (COZAAR) 100 MG tablet [id number]  Take 1 tablet (100 mg total) by mouth at bedtime. [name], MD  Active   mycophenolate (CELLCEPT) 500 MG tablet [id number]  Take 1,000 mg by mouth 2 (two) times daily.  [provider]  Active            Med Note Constance Haw, [facility] N   Thu Mar 01, 2022 12:52 PM) Reports taking taking 2 tabs in morning and 2 tabs at night  naloxone [facility]) nasal spray 4 mg/0.1 mL [id number]  Place 1 spray into the nose once. [provider]  Active            Med Note Constance Haw, [facility] N   Thu Mar 01, 2022  1:15 PM) Reports having available. Has not needed to use  omeprazole (PRILOSEC) 20 MG capsule [id number]  Take 20 mg by mouth daily as needed. [provider]  Active            Med Note Constance Haw, [facility] N   Thu Mar 01, 2022 12:53 PM) Reports taking as needed  oxyCODONE (OXY IR/ROXICODONE) 5 MG immediate release tablet [id number]  Take 1-2 tablets (5-10 mg total) by mouth every 6 (six) hours as needed for severe pain. [name], MD  Active   Semaglutide,0.25 or 0.5MG /DOS, (OZEMPIC, 0.25 OR 0.5 MG/DOSE,) 2 MG/3ML SOPN [id number] Yes Inject 0.5 mg into the skin once a week. Patient receives via [name] Patient Assistance Fisher, Demetrios Isaacs, MD Taking Active   sildenafil (REVATIO) 20 MG tablet [id number]   [provider]  Active            Med Note Constance Haw, Sarajane Jews Mar 01, 2022  1:06 PM) Reports not taking  sildenafil (VIAGRA) 100 MG tablet [id number]  Take 1 tab 1 hour prior to intercourse Stoioff, Verna Czech, MD  Active   tacrolimus (PROGRAF) 1 MG capsule [id number]  Take 3 mg by mouth 2 (two) times daily.  [provider]  Active Self           Med Note Constance Haw, [facility] N   Thu Mar 01, 2022 12:53 PM) Reports taking two in the morning and 2 at night  tamsulosin (FLOMAX) 0.4 MG CAPS capsule [id number]  Take 1 capsule (0.4 mg total) by mouth daily. [name], MD  Active   tretinoin (RETIN-A) 0.05 % cream [id number]  Apply topically as needed.  [provider]  Active   triamcinolone cream (KENALOG) 0.1 % [id number]  APPLY DAILY TO INFLAMED BUMPS AS NEEDED [provider]  Active   XYOSTED 50 MG/0.5ML Ivory Broad  [id number]   [provider]  Active            Med Note Constance Haw, [facility] N   Thu Mar 01, 2022  1:06 PM) Reports not taking              Assessment/Plan:   Unable to review medications or other medical conditions today as priority is for patient to contact Endocrinology office now to discuss recent blood sugar readings/medication adjustment. Agree to complete medication review during our next telephone appointment  Recommend patient contact Nephrology office today to schedule follow up appointment  Diabetes: - Reviewed long term cardiovascular and renal outcomes of uncontrolled blood sugar - Reviewed goal A1c, goal fasting, and goal 2 hour post prandial glucose - Review symptoms of and how to manage hypoglycemia  Encourage patient to continue to carry glucose tablets with him - Recommend to continue to use Freestyle Libre 2 CGM to monitor blood sugar/as feedback on dietary choices - Recommend to check glucose with fingerstick check when needed for symptoms and as back up to CGM. Patient to contact office if needed for readings outside of established parameters or symptoms  Encourage patient to contact Endocrinology office today to discuss recent hypoglycemia Also place call to [facility] on behalf of patient to collaborate regarding patient's recent hypoglycemia and current medication regimen   Follow Up Plan: Clinical Pharmacist will follow up with patient by telephone on 12/12/2022 at 9:00 AM   Estelle Grumbles, PharmD, [facility] [phone]

## 2022-10-31 NOTE — Patient Instructions (Signed)
Goals Addressed             This Visit's Progress    Pharmacy Goals       Please remember with Freestyle Libre 2 continuous glucose monitor to scan your sensor at least every 8 hours to capture your data.  Please check your Freestyle Libre 2 App to confirm that your alarms, including low glucose alarm, are turned on.  If you need to get in touch with patient assistance programs in the future regarding refills, you can contact: [employer] at [phone] regarding refills of Ozempic, Tresiba and Novolog - [name] at [phone] regarding refills of [name]     Thank you!   Estelle Grumbles, PharmD, [facility] [phone]

## 2022-10-31 NOTE — Progress Notes (Signed)
I, Maysun Anabel Bene, acting as a scribe for Riki Altes, MD., have documented all relevant documentation on the behalf of Riki Altes, MD, as directed by Riki Altes, MD while in the presence of Riki Altes, MD.  10/31/2022 2:39 PM   James Moreno [birth year] [id number]  Referring provider: [name], MD [address] [place],  [place] [address]  Chief Complaint  Patient presents with   Other    HPI: James Moreno is a 57 y.o. male presents to establish new urologic care.   States he saw Dr. Evelene Croon approximately Fall 2023 for lower urinary tract symptoms and erectile dysfunction.  He was started on tamsulosin and sildenafil 20 mg. Sildenafil has been effective for his ED and he is taking up to 40 mg.  He remains on tamsulosin but is not sure his symptoms were bothersome enough that he needed medication.  He would occasionally note decreased force on caliber of his urinary stream and sensation of incomplete emptying.  History of hypogonadism on testosterone injections, but not interested in restarting at this time. Has had an annual PSA and last PSA November 2023 was 0.2  PSA trend   Prostate Specific Ag, Serum Reflex Criteria  Latest Ref Rng 0.0 - 4.0 ng/mL   01/10/2018 0.1    01/27/2019 0.2  Comment   06/20/2020 0.2    02/12/2022 0.2  Comment      PMH: Past Medical History:  Diagnosis Date   Acute kidney failure, unspecified (HCC)    Anxiety    Diabetes mellitus, type 2 (HCC)    GERD (gastroesophageal reflux disease)    History of arterial disease of lower extremity    History of hepatitis B    Hypertension    Hypothyroidism    Mitral valve disorders(424.0)    Pityriasis 12/27/2014   Primary pulmonary HTN (HCC)    Pt denies ever having.   Tricuspid valve disorders, specified as nonrheumatic     Surgical History: Past Surgical History:  Procedure Laterality Date   APPENDECTOMY     Carotid Doppler Ultrasound  06/14/2009    39% stenosis of bilateral internal carotid artery, bilateral anterograde vertebral flow   ESOPHAGOGASTRODUODENOSCOPY (EGD) WITH PROPOFOL N/A 01/21/2019   Procedure: ESOPHAGOGASTRODUODENOSCOPY (EGD) WITH PROPOFOL;  Surgeon: Toney Reil, MD;  [facility];  Service: Endoscopy;  Laterality: N/A;  Diabetic - insulin   EYE SURGERY     right   HEMORROIDECTOMY     KIDNEY TRANSPLANT Right 2016   MECKEL DIVERTICULUM EXCISION     infancy   Myocardial Perfusion scan  01/17/2009   [facility], non- ischemic. LVEF= 55%   PARS PLANA VITRECTOMY Left 02/09/2015   Procedure: Pan retinal photocoagulation 46962;  Surgeon: Marcelene Butte, MD;  Location: [facility];  Service: Ophthalmology;  Laterality: Left;   REFRACTIVE SURGERY Left    sleep study  01/09/2011   Severe sleep apnea. AHI 72.9/hr. RDI=83.0/hr. Desaturation to 69.0% Emergency CPAP titaration to 14.0cm (01/14/19 resolved after wt loss after kidney transplant.)    Home Medications:  Allergies as of 10/31/2022       Reactions   No Known Allergies         Medication List        Accurate as of October 31, 2022  2:39 PM. If you have any questions, ask your nurse or doctor.          alprazolam 2 MG tablet Commonly known as:  XANAX Take 1 tablet (2 mg total) by mouth 3 (three) times daily as needed. for anxiety   amLODipine 10 MG tablet Commonly known as: NORVASC Take 10 mg by mouth daily.   atorvastatin 80 MG tablet Commonly known as: LIPITOR Take 1 tablet (80 mg total) by mouth daily.   BD Insulin Syringe U/F 31G X 5/16" 1 ML Misc Generic drug: Insulin Syringe-Needle U-100   calcitRIOL 0.25 MCG capsule Commonly known as: ROCALTROL Take 1 capsule (0.25 mcg total) by mouth daily.   carvedilol 12.5 MG tablet Commonly known as: COREG TAKE 1 AND 1/2 TABLETS TWICE DAILY   Cetirizine HCl 10 MG Caps Take 5 mg by mouth daily.   [name] 10 MG Tabs tablet Generic drug: dapagliflozin propanediol TAKE 1  TABLET BY MOUTH BEFORE BREAKFAST   insulin aspart 100 UNIT/ML injection Commonly known as: novoLOG Inject 15 Units into the skin 3 (three) times daily before meals.   losartan 100 MG tablet Commonly known as: COZAAR Take 1 tablet (100 mg total) by mouth at bedtime.   mycophenolate 500 MG tablet Commonly known as: CELLCEPT Take 1,000 mg by mouth 2 (two) times daily.   naloxone 4 MG/0.1ML Liqd nasal spray kit Commonly known as: NARCAN Place 1 spray into the nose once.   omeprazole 20 MG capsule Commonly known as: PRILOSEC Take 20 mg by mouth daily as needed.   oxyCODONE 5 MG immediate release tablet Commonly known as: Oxy IR/ROXICODONE Take 1-2 tablets (5-10 mg total) by mouth every 6 (six) hours as needed for severe pain.   Ozempic (0.25 or 0.5 MG/DOSE) 2 MG/3ML Sopn Generic drug: Semaglutide(0.25 or 0.5MG /DOS) Inject 0.5 mg into the skin once a week. Patient receives via [name] Patient Assistance   sildenafil 100 MG tablet Commonly known as: VIAGRA Take 1 tab 1 hour prior to intercourse Started by: Riki Altes   sildenafil 20 MG tablet Commonly known as: REVATIO   tacrolimus 1 MG capsule Commonly known as: PROGRAF Take 3 mg by mouth 2 (two) times daily.   tamsulosin 0.4 MG Caps capsule Commonly known as: FLOMAX Take 1 capsule (0.4 mg total) by mouth daily.   Evaristo Bury FlexTouch 100 UNIT/ML FlexTouch Pen Generic drug: insulin degludec Inject 39 Units into the skin daily. Patient receives via [name] Patient Assistance through Dec 2023   tretinoin 0.05 % cream Commonly known as: RETIN-A Apply topically as needed.   triamcinolone cream 0.1 % Commonly known as: KENALOG APPLY DAILY TO INFLAMED BUMPS AS NEEDED   Xyosted 50 MG/0.5ML Soaj Generic drug: Testosterone Enanthate        Allergies:  Allergies  Allergen Reactions   No Known Allergies     Family History: Family History  Problem Relation Age of Onset   Hypertension Mother     Hyperlipidemia Mother    Melanoma Father     Social History:  reports that he has been smoking cigarettes. He has a 41 pack-year smoking history. He has never used smokeless tobacco. He reports current alcohol use. He reports that he does not use drugs.   Physical Exam: Ht 6' (1.829 m)   Wt 214 lb (97.1 kg)   BMI 29.02 kg/m   Constitutional:  Alert and oriented, No acute distress. HEENT: [place] AT, moist mucus membranes.  Trachea midline, no masses. Cardiovascular: No clubbing, cyanosis, or edema. Respiratory: Normal respiratory effort, no increased work of breathing. GI: Abdomen is soft, nontender, nondistended, no abdominal masses Skin: No rashes, bruises or suspicious lesions. Neurologic:  Grossly intact, no focal deficits, moving all 4 extremities. Psychiatric: Normal mood and affect.   Assessment & Plan:    1. Lower urinary tract symptoms Some benefit with tamsulosin though not sure his symptoms are bothersome enough that he desires to continue.  We discussed discontinuing and he can restart for worsening lower urinary tract symptoms.  Follow-up annually or as needed for worsening symptoms  2. Erectile dysfunction He had been on sildenafil 100 mg in the past and felt this worked better. He was taking half of a tablet.  Rx sildenafil 100 mg was sent to pharmacy.  [facility] [address], [address] [place], [place] [address] [phone]

## 2022-11-01 ENCOUNTER — Encounter: Payer: Self-pay | Admitting: Urology

## 2022-11-06 NOTE — Telephone Encounter (Signed)
Medication Refill - Medication:  oxyCODONE (OXY IR/ROXICODONE) 5 MG immediate release tablet   Has the patient contacted their pharmacy? No. (Agent: If no, request that the patient contact the pharmacy for the refill. If patient does not wish to contact the pharmacy document the reason why and proceed with request.) (Agent: If yes, when and what did the pharmacy advise?)  Preferred Pharmacy (with phone number or street name): [facility] [address] [place], [place] - [address]  Has the patient been seen for an appointment in the last year OR does the patient have an upcoming appointment? Yes.    Agent: Please be advised that RX refills may take up to 3 business days. We ask that you follow-up with your pharmacy.

## 2022-11-06 NOTE — Telephone Encounter (Signed)
Requested medication (s) are due for refill today: Yes  Requested medication (s) are on the active medication list: Yes  Last refill:  10/10/22  Future visit scheduled: Yes  Notes to clinic:  Unable to refill per protocol, cannot delegate.      Requested Prescriptions  Pending Prescriptions Disp Refills   oxyCODONE (OXY IR/ROXICODONE) 5 MG immediate release tablet 240 tablet 0    Sig: Take 1-2 tablets (5-10 mg total) by mouth every 6 (six) hours as needed for severe pain.     Not Delegated - Analgesics:  Opioid Agonists Failed - 11/06/2022  9:45 AM      Failed - This refill cannot be delegated      Failed - Urine Drug Screen completed in last 360 days      Passed - Valid encounter within last 3 months    Recent Outpatient Visits           1 month ago Urinary hesitancy   [place] [facility] [name], MD   8 months ago Urinary hesitancy   [place] [facility] [name], MD   1 year ago Primary hypertension   [place] [facility] [name], MD   2 years ago Type 2 diabetes mellitus with diabetic nephropathy, without long-term current use of insulin (HCC)   [place] [facility] [name], MD   2 years ago Type 2 diabetes mellitus with diabetic nephropathy, without long-term current use of insulin (HCC)   [place] [facility] Sherrie Mustache, Demetrios Isaacs, MD       Future Appointments             In 1 month Fisher, Demetrios Isaacs, MD [facility]   In 11 months Stoioff, Verna Czech, MD [facility] [place]

## 2022-11-07 ENCOUNTER — Telehealth: Payer: Self-pay | Admitting: Family Medicine

## 2022-11-07 DIAGNOSIS — M79604 Pain in right leg: Secondary | ICD-10-CM

## 2022-11-07 DIAGNOSIS — L905 Scar conditions and fibrosis of skin: Secondary | ICD-10-CM

## 2022-11-07 NOTE — Telephone Encounter (Signed)
Patient came by to ask for refill on Oxycodone IR 5 mg.  [place] Spaniel he is out.    However last rx for this was 10/10/22 for #240.   Since Dr. Sherrie Mustache is out of the office till Friday, I wanted someone else to see this request.  [facility] [address]

## 2022-11-08 NOTE — Telephone Encounter (Signed)
Reviewed chart and medication hx.   Recommend that this be discussed with PCP upon return to office as patient's medication adherence does not match with prescribed 30 day supply.   I will not provide refills at this time.

## 2022-11-08 NOTE — Addendum Note (Signed)
Addended by: Lily Kocher on: 11/08/2022 10:40 AM   Modules accepted: Orders

## 2022-11-09 MED ORDER — OXYCODONE HCL 5 MG PO TABS
5.0000 mg | ORAL_TABLET | Freq: Four times a day (QID) | ORAL | 0 refills | Status: DC | PRN
Start: 2022-11-09 — End: 2022-12-06

## 2022-11-09 NOTE — Telephone Encounter (Signed)
Patient needing refills on Oxycodone IR 5 mg. He is out and is wanting this TODAY. [facility] [address]

## 2022-11-09 NOTE — Telephone Encounter (Signed)
He is due for urine drug screen. Have sent refill to pharmacy, but he needs to come in and provider urine sample before any additional refills will be approved.  He must provide sample in office... cannot bring one from home.

## 2022-11-09 NOTE — Telephone Encounter (Signed)
LVMTCB. CRM created. Ok for [facility] to advise 

## 2022-11-09 NOTE — Addendum Note (Signed)
Addended by: [name] on: 11/09/2022 03:04 PM   Modules accepted: Orders

## 2022-11-12 ENCOUNTER — Telehealth: Payer: Self-pay | Admitting: Pharmacist

## 2022-11-12 ENCOUNTER — Encounter: Payer: Self-pay | Admitting: Pharmacist

## 2022-11-12 NOTE — Telephone Encounter (Signed)
This encounter was created in error - please disregard.

## 2022-11-12 NOTE — Progress Notes (Signed)
   11/12/2022  Patient ID: James Moreno, male   DOB: 12-23-1965, 57 y.o.   MRN: [id number]  Receive a voicemail from patient requesting a call back.  Was unable to reach patient via telephone today and have left HIPAA compliant voicemail asking patient to return my call.   Follow Up Plan: Clinical Pharmacist will follow up with patient by telephone, as previously scheduled, on 12/12/2022 at 9:00 AM    Estelle Grumbles, PharmD, [facility] [phone]

## 2022-11-18 ENCOUNTER — Other Ambulatory Visit: Payer: Self-pay | Admitting: Cardiovascular Disease

## 2022-11-19 DIAGNOSIS — E1129 Type 2 diabetes mellitus with other diabetic kidney complication: Secondary | ICD-10-CM | POA: Diagnosis not present

## 2022-12-06 ENCOUNTER — Other Ambulatory Visit: Payer: Self-pay | Admitting: Family Medicine

## 2022-12-06 DIAGNOSIS — L905 Scar conditions and fibrosis of skin: Secondary | ICD-10-CM

## 2022-12-06 DIAGNOSIS — M79604 Pain in right leg: Secondary | ICD-10-CM

## 2022-12-06 NOTE — Telephone Encounter (Signed)
Medication Refill - Medication:  oxyCODONE (OXY IR/ROXICODONE) 5 MG immediate release tablet   Has the patient contacted their pharmacy? No. (Agent: If no, request that the patient contact the pharmacy for the refill. If patient does not wish to contact the pharmacy document the reason why and proceed with request.) (Agent: If yes, when and what did the pharmacy advise?)  Preferred Pharmacy (with phone number or street name): [facility] [address] [place], [place] - [address]  Has the patient been seen for an appointment in the last year OR does the patient have an upcoming appointment? Yes.    Agent: Please be advised that RX refills may take up to 3 business days. We ask that you follow-up with your pharmacy.

## 2022-12-06 NOTE — Telephone Encounter (Signed)
Requested medication (s) are due for refill today: Due 12/10/22  Requested medication (s) are on the active medication list: yes    Last refill: 11/09/22  #240  0 refills  Future visit scheduled yes 12/17/22  Notes to clinic:Not delegated, please review. Thank you.  Requested Prescriptions  Pending Prescriptions Disp Refills   oxyCODONE (OXY IR/ROXICODONE) 5 MG immediate release tablet 240 tablet 0    Sig: Take 1-2 tablets (5-10 mg total) by mouth every 6 (six) hours as needed for severe pain.     Not Delegated - Analgesics:  Opioid Agonists Failed - 12/06/2022  8:26 AM      Failed - This refill cannot be delegated      Failed - Urine Drug Screen completed in last 360 days      Passed - Valid encounter within last 3 months    Recent Outpatient Visits           2 months ago Urinary hesitancy   [place] [facility] [name], MD   9 months ago Urinary hesitancy   [place] [facility] [name], MD   1 year ago Primary hypertension   [place] [facility] [name], MD   2 years ago Type 2 diabetes mellitus with diabetic nephropathy, without long-term current use of insulin (HCC)   [facility] [name], MD   2 years ago Type 2 diabetes mellitus with diabetic nephropathy, without long-term current use of insulin (HCC)   [place] [facility] Sherrie Mustache, Demetrios Isaacs, MD       Future Appointments             In 1 week Fisher, Demetrios Isaacs, MD [facility]   In 10 months Stoioff, Verna Czech, MD [facility] [place]

## 2022-12-08 MED ORDER — OXYCODONE HCL 5 MG PO TABS
5.0000 mg | ORAL_TABLET | Freq: Four times a day (QID) | ORAL | 0 refills | Status: DC | PRN
Start: 2022-12-08 — End: 2023-01-03

## 2022-12-12 ENCOUNTER — Other Ambulatory Visit: Payer: Medicare HMO | Admitting: Pharmacist

## 2022-12-12 NOTE — Progress Notes (Unsigned)
12/12/2022 Name: James Moreno MRN: [id number] DOB: [birth year]  Chief Complaint  Patient presents with   Medication Assistance   Medication Management    [name] is a 57 y.o. year old male who presented for a telephone visit.   They were referred to the pharmacist by their PCP for assistance in managing medication access.   Unable to review medications or other medical conditions today as patient reports that he in not currently home  Subjective:  Care Team: Primary Care Provider: [name], MD ; Next Scheduled Visit: 12/17/2022 Endocrinologist James Quan, MD; Next Scheduled Visit: 12/19/2022 Urology: James Altes, MD; Next Scheduled Visit Nephrologist: James Haagensen, MD  Medication Access/Adherence  Current Pharmacy:  [facility] [address] [place] - [address] [place] [address] Phone: [phone] Fax: [phone]  [facility] (ESRD Bundle Only) - [place], [place] - [address] [place] [address] Phone: [phone] Fax: [phone]  [facility] [phone] [place] More[place], [place] - [address] [place] [address] Phone: [phone] Fax: [phone]  [facility] - [place], [place] - [address] [place] [address] Phone: [phone] Fax: [phone]   Patient reports affordability concerns with their medications: No  Patient reports access/transportation concerns to their pharmacy: No  Patient reports adherence concerns with their medications:  No       Diabetes:   Current medications:  - Tresiba FlexTouch 18 units daily (reports reduced this dose as directed by Endocrinologist related to hypoglycemia) - Novolog 8 units before meals three time daily and reports adjusting per sliding scale from Endocrinologist if blood sugar greater than 120 prior to meal - [name] 10 daily before  breakfast - Ozempic 1 mg weekly (reports increased ~2 months ago as directed by Endocrinologist)   Patient using Freestyle Libre 2 Continuous Glucose Monitoring (CGM). Uses phone in place of reader device - Connect to patient using Freestyle [employer] today Date of Download: 12/12/22 - Past 14 days: % Time CGM is active: 88% Average Glucose: 137 mg/dL Glucose Management Indicator: 6.6%  Glucose Variability: 38.4% (goal <36%) Time in Goal:  - Time in range 70-180: 79% - Time above range: 17% - Time low: 4% - Time very low: 0%   Reports hypoglycemia has improved, but admits to low blood sugar early this morning, which he attributes to having skipped dinner last night. Reports missed dinner because he was busy working on house renovation  [employer] understanding of how to manage low blood sugar and confirms carries glucose tablets with him   Reports plans to follow up with Endocrinologist to discuss further decreasing his Tresiba dose   Reports continues to work on weight loss; current weight ~195 lbs - Reports latest Ozempic dose increase has helped with appetite control/weight loss  Statin therapy: atorvastatin 80 mg daily   Current medication access support:  - Enrolled in Ozempic, [name] and Novolog assistance program from [name] and [place] patient assistance from [name]    Objective:  Lab Results  Component Value Date   HGBA1C 7.5 08/08/2022    Lab Results  Component Value Date   CREATININE 2.81 (H) 02/12/2022   BUN 36 (H) 02/12/2022   NA 130 (L) 02/12/2022   K 3.9 02/12/2022   CL 93 (L) 02/12/2022   CO2 20 02/12/2022    Lab Results  Component Value Date   CHOL 141 06/20/2020  HDL 46 06/20/2020   LDLCALC 53 06/20/2020   TRIG 268 (H) 06/20/2020   CHOLHDL 3.1 06/20/2020    Outpatient Encounter Medications as of 12/12/2022  Medication Sig Note   [name] 10 MG TABS tablet TAKE 1 TABLET BY MOUTH BEFORE BREAKFAST    insulin aspart (NOVOLOG) 100 UNIT/ML  injection Inject 8 Units into the skin 3 (three) times daily before meals. and reports adjusting per sliding scale from Endocrinologist if blood sugar greater than 120 prior to mea 03/01/2022: Reports receiving via [name]. Reports receiving assistance application via mail. Will submit   insulin degludec (TRESIBA FLEXTOUCH) 100 UNIT/ML FlexTouch Pen Inject 39 Units into the skin daily. Patient receives via [name] Patient Assistance through Dec 2023 (Patient taking differently: Inject 18 Units into the skin daily. Patient receives via [name] Patient Assistance through Dec 2023)    Semaglutide,0.25 or 0.5MG /DOS, (OZEMPIC, 0.25 OR 0.5 MG/DOSE,) 2 MG/3ML SOPN Inject 0.5 mg into the skin once a week. Patient receives via [name] Patient Assistance (Patient taking differently: Inject 1 mg into the skin once a week. Patient receives via [name] Patient Assistance) 12/12/2022: On Wednesdays   alprazolam (XANAX) 2 MG tablet Take 1 tablet (2 mg total) by mouth 3 (three) times daily as needed. for anxiety    amLODipine (NORVASC) 10 MG tablet Take 10 mg by mouth daily.  03/01/2022: Taking 10 mg in morning   atorvastatin (LIPITOR) 80 MG tablet Take 1 tablet (80 mg total) by mouth daily.    BD INSULIN SYRINGE U/F 31G X 5/16" 1 ML MISC     calcitRIOL (ROCALTROL) 0.25 MCG capsule Take 1 capsule (0.25 mcg total) by mouth daily.    carvedilol (COREG) 12.5 MG tablet TAKE 1 AND 1/2 TABLETS TWICE DAILY    Cetirizine HCl 10 MG CAPS Take 5 mg by mouth daily.    losartan (COZAAR) 100 MG tablet Take 1 tablet (100 mg total) by mouth at bedtime.    mycophenolate (CELLCEPT) 500 MG tablet Take 1,000 mg by mouth 2 (two) times daily. 03/01/2022: Reports taking taking 2 tabs in morning and 2 tabs at night   naloxone (NARCAN) nasal spray 4 mg/0.1 mL Place 1 spray into the nose once. 03/01/2022: Reports having available. Has not needed to use   omeprazole (PRILOSEC) 20 MG capsule Take 20 mg by mouth daily as  needed. 03/01/2022: Reports taking as needed   oxyCODONE (OXY IR/ROXICODONE) 5 MG immediate release tablet Take 1-2 tablets (5-10 mg total) by mouth every 6 (six) hours as needed for severe pain.    sildenafil (REVATIO) 20 MG tablet  03/01/2022: Reports not taking   sildenafil (VIAGRA) 100 MG tablet Take 1 tab 1 hour prior to intercourse    tacrolimus (PROGRAF) 1 MG capsule Take 3 mg by mouth 2 (two) times daily.  03/01/2022: Reports taking two in the morning and 2 at night   tamsulosin (FLOMAX) 0.4 MG CAPS capsule Take 1 capsule (0.4 mg total) by mouth daily.    tretinoin (RETIN-A) 0.05 % cream Apply topically as needed.     triamcinolone cream (KENALOG) 0.1 % APPLY DAILY TO INFLAMED BUMPS AS NEEDED    XYOSTED 50 MG/0.5ML SOAJ  03/01/2022: Reports not taking   No facility-administered encounter medications on file as of 12/12/2022.     Assessment/Plan:   Unable to review medications or other medical conditions today as patient reports that he in not currently home/is on the move  Have recommended patient contact Nephrology office to  schedule follow up appointment   Diabetes: - Reviewed long term cardiovascular and renal outcomes of uncontrolled blood sugar - Reviewed goal A1c, goal fasting, and goal 2 hour post prandial glucose - Reviewed dietary modifications including importance of having regular well-balanced meals throughout the day, while controlling carbohydrate portion sizes  Advise patient to avoid skipping meals to reduce risk of hypoglycemia - Review symptoms of and how to manage hypoglycemia             Encourage patient to continue to carry glucose tablets with him - Recommend to continue to use Freestyle Libre 2 CGM to monitor blood sugar/as feedback on dietary choices - Recommend to check glucose with fingerstick check when needed for symptoms and as back up to CGM. Patient to contact office if needed for readings outside of established parameters or symptoms Encourage patient  to contact Endocrinology to discuss recent blood sugar readings and his request to further decrease his Tresiba dose     Follow Up Plan: Clinical Pharmacist will follow up with patient by telephone on 01/16/2023 at 3:30 PM    Estelle Grumbles, PharmD, [facility] [phone]

## 2022-12-13 DIAGNOSIS — R809 Proteinuria, unspecified: Secondary | ICD-10-CM | POA: Diagnosis not present

## 2022-12-13 DIAGNOSIS — Z940 Kidney transplant status: Secondary | ICD-10-CM | POA: Diagnosis not present

## 2022-12-13 DIAGNOSIS — N186 End stage renal disease: Secondary | ICD-10-CM | POA: Diagnosis not present

## 2022-12-13 DIAGNOSIS — N2581 Secondary hyperparathyroidism of renal origin: Secondary | ICD-10-CM | POA: Diagnosis not present

## 2022-12-13 DIAGNOSIS — I10 Essential (primary) hypertension: Secondary | ICD-10-CM | POA: Diagnosis not present

## 2022-12-13 NOTE — Patient Instructions (Signed)
Goals Addressed             This Visit's Progress    Pharmacy Goals       Please remember with Freestyle Libre 2 continuous glucose monitor to scan your sensor at least every 8 hours to capture your data.  Please check your Freestyle Libre 2 App to confirm that your alarms, including low glucose alarm, are turned on.  If you need to get in touch with patient assistance programs in the future regarding refills, you can contact: [employer] at [phone] regarding refills of Ozempic, Tresiba and Novolog - [name] at [phone] regarding refills of [name]     Thank you!   Estelle Grumbles, PharmD, [facility] [phone]

## 2022-12-17 ENCOUNTER — Ambulatory Visit: Payer: Medicare HMO | Admitting: Family Medicine

## 2022-12-17 DIAGNOSIS — F1190 Opioid use, unspecified, uncomplicated: Secondary | ICD-10-CM

## 2022-12-17 DIAGNOSIS — L905 Scar conditions and fibrosis of skin: Secondary | ICD-10-CM

## 2022-12-17 DIAGNOSIS — G8928 Other chronic postprocedural pain: Secondary | ICD-10-CM

## 2022-12-18 DIAGNOSIS — L720 Epidermal cyst: Secondary | ICD-10-CM | POA: Diagnosis not present

## 2022-12-19 ENCOUNTER — Telehealth: Payer: Self-pay

## 2022-12-19 NOTE — Patient Outreach (Signed)
Care Management   Outreach Note  12/19/2022 Name: James Moreno MRN: [id number] DOB: [birth year]  An unsuccessful telephone outreach was attempted today to contact the patient about Care Management needs.     Follow Up Plan:  A HIPAA compliant phone message was left for the patient providing contact information and requesting a return call.    [facility]  [facility] RN Care Manager Direct Dial: [phone] Website: [email or web address]

## 2022-12-21 ENCOUNTER — Other Ambulatory Visit: Payer: Self-pay | Admitting: Family Medicine

## 2022-12-21 NOTE — Telephone Encounter (Signed)
Requested medications are due for refill today.  yes  Requested medications are on the active medications list.  yes  Last refill. 10/26/2022 #90 1 rf  Future visit scheduled.   no  Notes to clinic.  Refill not delegated.    Requested Prescriptions  Pending Prescriptions Disp Refills   alprazolam (XANAX) 2 MG tablet 90 tablet 1    Sig: Take 1 tablet (2 mg total) by mouth 3 (three) times daily as needed. for anxiety     Not Delegated - Psychiatry: Anxiolytics/Hypnotics 2 Failed - 12/21/2022 11:29 AM      Failed - This refill cannot be delegated      Failed - Urine Drug Screen completed in last 360 days      Passed - Patient is not pregnant      Passed - Valid encounter within last 6 months    Recent Outpatient Visits           3 months ago Urinary hesitancy   [facility] [name], MD   10 months ago Urinary hesitancy   [facility] [name], MD   1 year ago Primary hypertension   [facility] [name], MD   2 years ago Type 2 diabetes mellitus with diabetic nephropathy, without long-term current use of insulin (HCC)   [place] [facility] [name], MD   2 years ago Type 2 diabetes mellitus with diabetic nephropathy, without long-term current use of insulin (HCC)   [facility] Sherrie Mustache, Demetrios Isaacs, MD       Future Appointments             In 10 months Stoioff, Verna Czech, MD [facility] [place]

## 2022-12-21 NOTE — Telephone Encounter (Signed)
Requested medication (s) are due for refill today:   Provider to review  Requested medication (s) are on the active medication list:   Yes  Future visit scheduled:   No   Last ordered: 12/21/2022 #90,1 refill  Non delegated duplicate refill request.     Requested Prescriptions  Pending Prescriptions Disp Refills   alprazolam (XANAX) 2 MG tablet [Pharmacy Med Name: ALPRAZolam 2 MG Oral Tablet] 90 tablet 0    Sig: TAKE 1 TABLET BY MOUTH THREE TIMES DAILY AS NEEDED FOR ANXIETY     Not Delegated - Psychiatry: Anxiolytics/Hypnotics 2 Failed - 12/21/2022 11:14 AM      Failed - This refill cannot be delegated      Failed - Urine Drug Screen completed in last 360 days      Passed - Patient is not pregnant      Passed - Valid encounter within last 6 months    Recent Outpatient Visits           3 months ago Urinary hesitancy   [place] [facility] [name], MD   10 months ago Urinary hesitancy   [place] [facility] [name], MD   1 year ago Primary hypertension   [place] [facility] [name], MD   2 years ago Type 2 diabetes mellitus with diabetic nephropathy, without long-term current use of insulin (HCC)   [facility] [facility] [name], MD   2 years ago Type 2 diabetes mellitus with diabetic nephropathy, without long-term current use of insulin (HCC)   [place] [facility] Sherrie Mustache, Demetrios Isaacs, MD       Future Appointments             In 10 months Stoioff, Verna Czech, MD [facility] [place]

## 2022-12-21 NOTE — Telephone Encounter (Signed)
Medication Refill - Medication: alprazolam Prudy Feeler) 2 MG tablet [[id number]]   Has the patient contacted their pharmacy? Yes.   (Agent: If no, request that the patient contact the pharmacy for the refill. If patient does not wish to contact the pharmacy document the reason why and proceed with request.) (Agent: If yes, when and what did the pharmacy advise?)  Preferred Pharmacy (with phone number or street name):  [facility] [place], [place] - [address] Phone: [phone]  Fax: [phone]     Has the patient been seen for an appointment in the last year OR does the patient have an upcoming appointment? Yes.    Agent: Please be advised that RX refills may take up to 3 business days. We ask that you follow-up with your pharmacy.

## 2022-12-25 DIAGNOSIS — Z940 Kidney transplant status: Secondary | ICD-10-CM | POA: Diagnosis not present

## 2022-12-25 DIAGNOSIS — E781 Pure hyperglyceridemia: Secondary | ICD-10-CM | POA: Diagnosis not present

## 2022-12-26 DIAGNOSIS — I152 Hypertension secondary to endocrine disorders: Secondary | ICD-10-CM | POA: Diagnosis not present

## 2022-12-26 DIAGNOSIS — E1159 Type 2 diabetes mellitus with other circulatory complications: Secondary | ICD-10-CM | POA: Diagnosis not present

## 2022-12-26 DIAGNOSIS — E785 Hyperlipidemia, unspecified: Secondary | ICD-10-CM | POA: Diagnosis not present

## 2022-12-26 DIAGNOSIS — N2581 Secondary hyperparathyroidism of renal origin: Secondary | ICD-10-CM | POA: Diagnosis not present

## 2022-12-26 DIAGNOSIS — E1169 Type 2 diabetes mellitus with other specified complication: Secondary | ICD-10-CM | POA: Diagnosis not present

## 2022-12-26 DIAGNOSIS — Z794 Long term (current) use of insulin: Secondary | ICD-10-CM | POA: Diagnosis not present

## 2022-12-26 DIAGNOSIS — E1129 Type 2 diabetes mellitus with other diabetic kidney complication: Secondary | ICD-10-CM | POA: Diagnosis not present

## 2023-01-03 ENCOUNTER — Other Ambulatory Visit: Payer: Self-pay | Admitting: Family Medicine

## 2023-01-03 DIAGNOSIS — M79604 Pain in right leg: Secondary | ICD-10-CM

## 2023-01-03 DIAGNOSIS — N186 End stage renal disease: Secondary | ICD-10-CM | POA: Diagnosis not present

## 2023-01-03 DIAGNOSIS — I10 Essential (primary) hypertension: Secondary | ICD-10-CM | POA: Diagnosis not present

## 2023-01-03 DIAGNOSIS — Z940 Kidney transplant status: Secondary | ICD-10-CM | POA: Diagnosis not present

## 2023-01-03 DIAGNOSIS — L905 Scar conditions and fibrosis of skin: Secondary | ICD-10-CM

## 2023-01-03 DIAGNOSIS — N2581 Secondary hyperparathyroidism of renal origin: Secondary | ICD-10-CM | POA: Diagnosis not present

## 2023-01-03 NOTE — Telephone Encounter (Signed)
Requested medication (s) are due for refill today: yes 01/07/23  Requested medication (s) are on the active medication list: yes   Last refill:  12/08/22 #240 0 refills   Future visit scheduled: no   Notes to clinic:   not delegated per protocol. Do you want to refill Rx?     Requested Prescriptions  Pending Prescriptions Disp Refills   oxyCODONE (OXY IR/ROXICODONE) 5 MG immediate release tablet 240 tablet 0    Sig: Take 1-2 tablets (5-10 mg total) by mouth every 6 (six) hours as needed for severe pain.     Not Delegated - Analgesics:  Opioid Agonists Failed - 01/03/2023  1:47 PM      Failed - This refill cannot be delegated      Failed - Urine Drug Screen completed in last 360 days      Failed - Valid encounter within last 3 months    Recent Outpatient Visits           3 months ago Urinary hesitancy   [place] [name], MD   10 months ago Urinary hesitancy   [place] [facility] [name], MD   1 year ago Primary hypertension   [place] [facility] [name], MD   2 years ago Type 2 diabetes mellitus with diabetic nephropathy, without long-term current use of insulin (HCC)   [place] [facility] [name], MD   2 years ago Type 2 diabetes mellitus with diabetic nephropathy, without long-term current use of insulin (HCC)   [facility] Sherrie Mustache, Demetrios Isaacs, MD       Future Appointments             In 10 months Stoioff, Verna Czech, MD [facility] [place]

## 2023-01-03 NOTE — Telephone Encounter (Signed)
Medication Refill - Medication: oxyCODONE (OXY IR/ROXICODONE) 5 MG immediate release tablet   Has the patient contacted their pharmacy? yes (Agent: If yes, when and what did the pharmacy advise?)contact pcp  Preferred Pharmacy (with phone number or street name):  [facility] [place], [place] - [address] Phone: [phone]  Fax: [phone]     Has the patient been seen for an appointment in the last year OR does the patient have an upcoming appointment? yes  Agent: Please be advised that RX refills may take up to 3 business days. We ask that you follow-up with your pharmacy.

## 2023-01-04 MED ORDER — OXYCODONE HCL 5 MG PO TABS: 5.0000 mg | ORAL_TABLET | Freq: Four times a day (QID) | ORAL | 0 refills | Status: DC | PRN

## 2023-01-16 ENCOUNTER — Telehealth: Payer: Self-pay | Admitting: Pharmacist

## 2023-01-16 ENCOUNTER — Other Ambulatory Visit: Payer: Self-pay | Admitting: Pharmacist

## 2023-01-16 NOTE — Progress Notes (Signed)
Outreach Note  01/16/2023 Name: JAFFET SILSBY MRN: [id number] DOB: [birth year]  Referred by: [name], MD  Was unable to reach patient via telephone today and have left HIPAA compliant voicemail asking patient to return my call.   Follow Up Plan: Will collaborate with Care Guide to outreach to schedule follow up with me  Estelle Grumbles, PharmD, [facility] [phone]

## 2023-01-17 DIAGNOSIS — D23112 Other benign neoplasm of skin of right lower eyelid, including canthus: Secondary | ICD-10-CM | POA: Diagnosis not present

## 2023-01-18 ENCOUNTER — Ambulatory Visit: Payer: Medicare HMO | Admitting: Cardiovascular Disease

## 2023-01-18 ENCOUNTER — Other Ambulatory Visit: Payer: Self-pay | Admitting: Family Medicine

## 2023-01-18 NOTE — Telephone Encounter (Signed)
Requested medications are due for refill today.  yes  Requested medications are on the active medications list.  yes  Last refill. 12/24/2022 #90 0 rf  Future visit scheduled.   no  Notes to clinic.  Refill not delegated.    Requested Prescriptions  Pending Prescriptions Disp Refills   alprazolam (XANAX) 2 MG tablet [Pharmacy Med Name: ALPRAZolam 2 MG Oral Tablet] 90 tablet 0    Sig: TAKE 1 TABLET BY MOUTH THREE TIMES DAILY AS NEEDED FOR ANXIETY     Not Delegated - Psychiatry: Anxiolytics/Hypnotics 2 Failed - 01/18/2023  9:12 AM      Failed - This refill cannot be delegated      Failed - Urine Drug Screen completed in last 360 days      Passed - Patient is not pregnant      Passed - Valid encounter within last 6 months    Recent Outpatient Visits           4 months ago Urinary hesitancy   [place] [facility] [name], MD   11 months ago Urinary hesitancy   [place] [facility] [name], MD   1 year ago Primary hypertension   [place] [facility] [name], MD   2 years ago Type 2 diabetes mellitus with diabetic nephropathy, without long-term current use of insulin (HCC)   [place] [facility] [name], MD   2 years ago Type 2 diabetes mellitus with diabetic nephropathy, without long-term current use of insulin (HCC)   [place] [facility] Sherrie Mustache, Demetrios Isaacs, MD       Future Appointments             In 3 days Gollan, Tollie Pizza, MD [facility] [place]   In 9 months Stoioff, Verna Czech, MD [facility] [place]

## 2023-01-20 NOTE — Progress Notes (Unsigned)
Evaluation Performed:  Follow-up visit  Date:  01/21/2023   ID:  James Moreno, James Moreno Oct 22, 1965, MRN [id number]  Patient Location:  [address] [place] [address]   Provider location:   [facility] [place] office  PCP:  James Limes, MD  Cardiologist:  James Moreno [facility]  Chief Complaint  Patient presents with   12 month follow up     "Doing well." Medications reviewed by the patient verbally.     History of Present Illness:    James Moreno is a 57 y.o. male  past medical history of Diabetes type 2 previously on peritoneal hemodialysis,   kidney transplant April 2016 in [place],  hypertension ,  long smoking history who continues to smoke,  Bilateral carotid disease,  50% LE arterial disease Covid 10/2019 evaluated for chest pain in the past,  presenting for routine followup of his PAD.   Last seen in clinic November 2023 Followed by nephrology, recent deterioration in renal function GFR down to 10 with urine protein creatinine ratio 7.8 On losartan, [name] held by nephrology Considering renal replacement therapy, potential family donor  On amlodipine, carvedilol, losartan Blood pressure relatively well-controlled  Reports having an episode of chest pain last week, concerned it could be angina Trying to cut down on Smoking, on nicotine gum  Hobbies include guitar, in band Rides his motorcycle  Continues on Ozempic, dramatic weight loss over the past 2 years  Labs reviewed A1C 6.2 CR 6.36 Total chol 118 LDL 57  Known carotid disease, moderate disease noted  EKG personally reviewed by myself on todays visit EKG Interpretation Date/Time:  Monday January 21 2023 08:58:32 EDT Ventricular Rate:  87 PR Interval:  176 QRS Duration:  96 QT Interval:  412 QTC Calculation: 495 R Axis:   3  Text Interpretation: Normal sinus rhythm Anteroseptal infarct (cited on or before 21-Oct-2015) ST & T wave  abnormality, consider lateral ischemia When compared with ECG of 21-Dec-2019 11:20, QRS duration has increased ST no longer elevated in Lateral leads T wave inversion now evident in Lateral leads Confirmed by James Moreno [phone]) on 01/21/2023 9:01:11 AM     Other past medical history  previously required iron infusions   history of  chronic discomfort in his legs. Reports having ABIs with Dr. Johny Moreno   Echocardiogram over the past 6 months was essentially normal, normal ejection fraction estimated at greater than 55% . This was done in preparation for kidney transplant listing .    stress test in 2013 showed no ischemia, done at [facility], repeat stress test in the past month or so at [facility]  Did stress test in the past on treadmill, had trouble Also did lexiscan 2015, had severe SOB    Past Medical History:  Diagnosis Date   Acute kidney failure, unspecified (HCC)    Anxiety    Diabetes mellitus, type 2 (HCC)    GERD (gastroesophageal reflux disease)    History of arterial disease of lower extremity    History of hepatitis B    Hypertension    Hypothyroidism    Mitral valve disorders(424.0)    Pityriasis 12/27/2014   Primary pulmonary HTN (HCC)    Pt denies ever having.   Tricuspid valve disorders, specified as nonrheumatic    Past Surgical History:  Procedure Laterality Date   APPENDECTOMY     Carotid Doppler Ultrasound  06/14/2009   39% stenosis of bilateral internal carotid artery,  bilateral anterograde vertebral flow   ESOPHAGOGASTRODUODENOSCOPY (EGD) WITH PROPOFOL N/A 01/21/2019   Procedure: ESOPHAGOGASTRODUODENOSCOPY (EGD) WITH PROPOFOL;  Surgeon: James Reil, MD;  Location: [facility];  Service: Endoscopy;  Laterality: N/A;  Diabetic - insulin   EYE SURGERY     right   HEMORROIDECTOMY     KIDNEY TRANSPLANT Right 2016   MECKEL DIVERTICULUM EXCISION     infancy   Myocardial Perfusion scan  01/17/2009   [facility], non- ischemic.  LVEF= 55%   PARS PLANA VITRECTOMY Left 02/09/2015   Procedure: Pan retinal photocoagulation 56213;  Surgeon: James Butte, MD;  Location: [facility];  Service: Ophthalmology;  Laterality: Left;   REFRACTIVE SURGERY Left    sleep study  01/09/2011   Severe sleep apnea. AHI 72.9/hr. RDI=83.0/hr. Desaturation to 69.0% Emergency CPAP titaration to 14.0cm (01/14/19 resolved after wt loss after kidney transplant.)     Current Meds  Medication Sig   alprazolam (XANAX) 2 MG tablet TAKE 1 TABLET BY MOUTH THREE TIMES DAILY AS NEEDED FOR ANXIETY   amLODipine (NORVASC) 10 MG tablet Take 10 mg by mouth daily.    atorvastatin (LIPITOR) 80 MG tablet Take 1 tablet (80 mg total) by mouth daily.   calcitRIOL (ROCALTROL) 0.25 MCG capsule Take 1 capsule (0.25 mcg total) by mouth daily.   carvedilol (COREG) 12.5 MG tablet TAKE 1 AND 1/2 TABLETS TWICE DAILY   chlorhexidine (PERIDEX) 0.12 % solution SMARTSIG:0.5 Capful(s) By Mouth Twice Daily   cinacalcet (SENSIPAR) 30 MG tablet Take 30 mg by mouth daily.   [name] 10 MG TABS tablet TAKE 1 TABLET BY MOUTH BEFORE BREAKFAST   insulin aspart (NOVOLOG) 100 UNIT/ML injection Inject 8 Units into the skin 3 (three) times daily before meals. and reports adjusting per sliding scale from Endocrinologist if blood sugar greater than 120 prior to mea   insulin degludec (TRESIBA FLEXTOUCH) 100 UNIT/ML FlexTouch Pen Inject 39 Units into the skin daily. Patient receives via [name] Patient Assistance through Dec 2023 (Patient taking differently: Inject 18 Units into the skin daily. Patient receives via [name] Patient Assistance through Dec 2023)   losartan (COZAAR) 100 MG tablet Take 1 tablet (100 mg total) by mouth at bedtime.   mycophenolate (CELLCEPT) 500 MG tablet Take 1,000 mg by mouth 2 (two) times daily.   naloxone (NARCAN) nasal spray 4 mg/0.1 mL Place 1 spray into the nose once.   omeprazole (PRILOSEC) 20 MG capsule Take 20 mg by mouth daily as needed.    oxyCODONE (OXY IR/ROXICODONE) 5 MG immediate release tablet Take 1-2 tablets (5-10 mg total) by mouth every 6 (six) hours as needed for severe pain.   Semaglutide,0.25 or 0.5MG /DOS, (OZEMPIC, 0.25 OR 0.5 MG/DOSE,) 2 MG/3ML SOPN Inject 0.5 mg into the skin once a week. Patient receives via [name] Patient Assistance (Patient taking differently: Inject 1 mg into the skin once a week. Patient receives via [name] Patient Assistance)   sildenafil (VIAGRA) 100 MG tablet Take 1 tab 1 hour prior to intercourse   tacrolimus (PROGRAF) 1 MG capsule Take 3 mg by mouth 2 (two) times daily.    tamsulosin (FLOMAX) 0.4 MG CAPS capsule Take 1 capsule (0.4 mg total) by mouth daily.   tretinoin (RETIN-A) 0.05 % cream Apply topically as needed.    triamcinolone cream (KENALOG) 0.1 % APPLY DAILY TO INFLAMED BUMPS AS NEEDED     Allergies:   No known allergies   Social History   Tobacco Use   Smoking status:  Every Day    Current packs/day: 1.00    Average packs/day: 1 pack/day for 41.0 years (41.0 ttl pk-yrs)    Types: Cigarettes   Smokeless tobacco: Never   Tobacco comments:    since age 32.    Smokes 1/2 PPD; decreased on 12/22/2022.   Vaping Use   Vaping status: Former  Substance Use Topics   Alcohol use: Not Currently    Alcohol/week: 0.0 - 1.0 standard drinks of alcohol    Comment: Excessive alcohol consumption in the past. Quit around 2016. .   Drug use: No     Family Hx: The patient's family history includes Hyperlipidemia in his mother; Hypertension in his mother; Melanoma in his father.  ROS:   Please see the history of present illness.    Review of Systems  Constitutional: Negative.   HENT: Negative.    Respiratory: Negative.    Cardiovascular: Negative.   Gastrointestinal: Negative.   Musculoskeletal: Negative.   Neurological: Negative.   Psychiatric/Behavioral: Negative.    All other systems reviewed and are negative.    Labs/Other Tests and Data Reviewed:    Recent  Labs: 02/12/2022: BUN 36; Creatinine, Ser 2.81; Potassium 3.9; Sodium 130   Recent Lipid Panel Lab Results  Component Value Date/Time   CHOL 141 06/20/2020 09:03 AM   TRIG 268 (H) 06/20/2020 09:03 AM   HDL 46 06/20/2020 09:03 AM   CHOLHDL 3.1 06/20/2020 09:03 AM   LDLCALC 53 06/20/2020 09:03 AM    Wt Readings from Last 3 Encounters:  01/21/23 208 lb (94.3 kg)  10/31/22 214 lb (97.1 kg)  09/14/22 214 lb 14.4 oz (97.5 kg)     Exam:    Vital Signs: Vital signs may also be detailed in the HPI BP 138/60 (BP Location: Left Arm, Patient Position: Sitting, Cuff Size: Normal)   Pulse 87   Ht 5' 11.5" (1.816 m)   Wt 208 lb (94.3 kg)   SpO2 97%   BMI 28.61 kg/m   Constitutional:  oriented to person, place, and time. No distress.  HENT:  Head: Grossly normal Eyes:  no discharge. No scleral icterus.  Neck: No JVD, no carotid bruits  Cardiovascular: Regular rate and rhythm, no murmurs appreciated Pulmonary/Chest: Clear to auscultation bilaterally, no wheezes or rails Abdominal: Soft.  no distension.  no tenderness.  Musculoskeletal: Normal range of motion Neurological:  normal muscle tone. Coordination normal. No atrophy Skin: Skin warm and dry Psychiatric: normal affect, pleasant  ASSESSMENT & PLAN:    PAD (peripheral artery disease) (HCC) Chronic stable claudication symptoms,  Cholesterol at goal, A1c at goal, smoking cessation recommended  Atherosclerosis of artery of extremity with ulceration (HCC) Chronic stable claudication sx,  Recommended smoking cessation as above  Chest pain/angina Abn EKG I and AVL, T wave inversion Angina last week We have ordered a lexiscan myoview  Bilateral carotid artery disease, unspecified type (HCC) Stable moderate dx Smoking cessation recommended, cholesterol at goal Diabetes numbers improved  Mixed hyperlipidemia Cholesterol is at goal on the current lipid regimen. No changes to the medications were made.  Hypertension secondary  to other renal disorders Blood pressure is well controlled on today's visit. No changes made to the medications.  Renal transplant with acute on chronic renal failure Acute change in renal function, CR >6 with GFR 10 Reports that he is looking at his options, potential donor from brother or fianc Thinking about peritoneal versus hemodialysis as a bridge to transplant Discussed both modalities with him, he was previously on  peritoneal.  Some issues with IV fluid production in [place] may limit options  Signed, James Nordmann, MD  01/21/2023 9:01 AM    [facility] [address], [place], [place] [address]

## 2023-01-21 ENCOUNTER — Ambulatory Visit: Payer: Medicare HMO | Attending: Cardiovascular Disease | Admitting: Cardiovascular Disease

## 2023-01-21 ENCOUNTER — Encounter: Payer: Self-pay | Admitting: Cardiovascular Disease

## 2023-01-21 VITALS — BP 138/60 | HR 87 | Ht 71.5 in | Wt 208.0 lb

## 2023-01-21 DIAGNOSIS — I209 Angina pectoris, unspecified: Secondary | ICD-10-CM

## 2023-01-21 DIAGNOSIS — I739 Peripheral vascular disease, unspecified: Secondary | ICD-10-CM

## 2023-01-21 DIAGNOSIS — E785 Hyperlipidemia, unspecified: Secondary | ICD-10-CM | POA: Diagnosis not present

## 2023-01-21 DIAGNOSIS — I779 Disorder of arteries and arterioles, unspecified: Secondary | ICD-10-CM | POA: Diagnosis not present

## 2023-01-21 DIAGNOSIS — I10 Essential (primary) hypertension: Secondary | ICD-10-CM

## 2023-01-21 DIAGNOSIS — Z720 Tobacco use: Secondary | ICD-10-CM

## 2023-01-21 DIAGNOSIS — I25118 Atherosclerotic heart disease of native coronary artery with other forms of angina pectoris: Secondary | ICD-10-CM | POA: Diagnosis not present

## 2023-01-21 DIAGNOSIS — N186 End stage renal disease: Secondary | ICD-10-CM

## 2023-01-21 MED ORDER — CARVEDILOL 12.5 MG PO TABS: 18.7500 mg | ORAL_TABLET | Freq: Two times a day (BID) | ORAL | 3 refills | Status: DC

## 2023-01-21 NOTE — Patient Instructions (Addendum)
Medication Instructions:  No changes  If you need a refill on your cardiac medications before your next appointment, please call your pharmacy.   Lab work: No new labs needed  Testing/Procedures: Your provider has ordered a Lexiscan/ Exercise Myoview Stress test. This will take place at [facility]. Please report to the [facility] medical mall entrance. The volunteers at the first desk will direct you where to go.  [facility] MYOVIEW  Your provider has ordered a Stress Test with nuclear imaging. The purpose of this test is to evaluate the blood supply to your heart muscle. This procedure is referred to as a "Non-Invasive Stress Test." This is because other than having an IV started in your vein, nothing is inserted or "invades" your body. Cardiac stress tests are done to find areas of poor blood flow to the heart by determining the extent of coronary artery disease (CAD). Some patients exercise on a treadmill, which naturally increases the blood flow to your heart, while others who are unable to walk on a treadmill due to physical limitations will have a pharmacologic/chemical stress agent called Lexiscan . This medicine will mimic walking on a treadmill by temporarily increasing your coronary blood flow.   Please note: these test may take anywhere between 2-4 hours to complete  How to prepare for your Myoview test:  Nothing to eat for 6 hours prior to the test No caffeine for 24 hours prior to test No smoking 24 hours prior to test. Your medication may be taken with water.  Hold insulin the morning of test.  Ladies, please do not wear dresses.  Skirts or pants are appropriate. Please wear a short sleeve shirt. No perfume, cologne or lotion. Wear comfortable walking shoes. No heels!   PLEASE NOTIFY THE OFFICE AT LEAST 24 HOURS IN ADVANCE IF YOU ARE UNABLE TO KEEP YOUR APPOINTMENT.  [phone] AND  PLEASE NOTIFY NUCLEAR MEDICINE AT [facility] AT LEAST 24 HOURS IN ADVANCE IF YOU ARE UNABLE TO KEEP YOUR  APPOINTMENT. [phone]   Follow-Up: At [facility], you and your health needs are our priority.  As part of our continuing mission to provide you with exceptional heart care, we have created designated Provider Care Teams.  These Care Teams include your primary Cardiologist (physician) and Advanced Practice Providers (APPs -  Physician Assistants and Nurse Practitioners) who all work together to provide you with the care you need, when you need it.  You will need a follow up appointment in 12 months  Providers on your designated Care Team:   Nicolasa Ducking, NP Eula Listen, PA-C Cadence Fransico Michael, [place]  COVID-19 Vaccine Information can be found at: [email or web address] For questions related to vaccine distribution or appointments, please email [email or web address] or call [phone].

## 2023-01-22 ENCOUNTER — Telehealth: Payer: Self-pay | Admitting: Family Medicine

## 2023-01-22 NOTE — Telephone Encounter (Signed)
LVM  informing patient Novofine tips has arrived and ready for pick up.

## 2023-01-23 ENCOUNTER — Telehealth: Payer: Self-pay

## 2023-01-23 NOTE — Progress Notes (Signed)
Care Coordination Note  01/23/2023 Name: James Moreno MRN: [id number] DOB: 1966/02/10  [name] is a 57 y.o. year old male who is a primary care patient of Sherrie Mustache, Demetrios Isaacs, MD and is actively engaged with the Care Management team. I reached out to Merrilee Jansky by phone today to assist with re-scheduling a follow up visit with the Pharmacist  Follow up plan: Unsuccessful telephone outreach attempt made. A HIPAA compliant phone message was left for the patient providing contact information and requesting a return call.   Penne Lash, RMA Care Guide [facility]  [place], [place] [address] Direct Dial: [phone] [email or web address]

## 2023-01-25 DIAGNOSIS — Z992 Dependence on renal dialysis: Secondary | ICD-10-CM | POA: Diagnosis not present

## 2023-01-25 DIAGNOSIS — N186 End stage renal disease: Secondary | ICD-10-CM | POA: Diagnosis not present

## 2023-01-26 DIAGNOSIS — N186 End stage renal disease: Secondary | ICD-10-CM | POA: Diagnosis not present

## 2023-01-26 DIAGNOSIS — Z992 Dependence on renal dialysis: Secondary | ICD-10-CM | POA: Diagnosis not present

## 2023-01-27 DIAGNOSIS — N186 End stage renal disease: Secondary | ICD-10-CM | POA: Diagnosis not present

## 2023-01-27 DIAGNOSIS — Z992 Dependence on renal dialysis: Secondary | ICD-10-CM | POA: Diagnosis not present

## 2023-01-28 DIAGNOSIS — I10 Essential (primary) hypertension: Secondary | ICD-10-CM | POA: Diagnosis not present

## 2023-01-28 DIAGNOSIS — Z940 Kidney transplant status: Secondary | ICD-10-CM | POA: Diagnosis not present

## 2023-01-28 DIAGNOSIS — N2581 Secondary hyperparathyroidism of renal origin: Secondary | ICD-10-CM | POA: Diagnosis not present

## 2023-01-28 DIAGNOSIS — Z992 Dependence on renal dialysis: Secondary | ICD-10-CM | POA: Diagnosis not present

## 2023-01-28 DIAGNOSIS — N186 End stage renal disease: Secondary | ICD-10-CM | POA: Diagnosis not present

## 2023-01-29 ENCOUNTER — Telehealth (INDEPENDENT_AMBULATORY_CARE_PROVIDER_SITE_OTHER): Payer: Self-pay

## 2023-01-29 DIAGNOSIS — N186 End stage renal disease: Secondary | ICD-10-CM | POA: Diagnosis not present

## 2023-01-29 DIAGNOSIS — Z992 Dependence on renal dialysis: Secondary | ICD-10-CM | POA: Diagnosis not present

## 2023-01-29 NOTE — Telephone Encounter (Signed)
Spoke with the patient and he is scheduled with Dr. Wyn Quaker on 02/04/23 with a 12:30 pm arrival time to the [facility] for a permcath insertion. Pre-procedure instructions were discussed and will be sent to Mychart and mailed.

## 2023-01-30 ENCOUNTER — Other Ambulatory Visit: Payer: Self-pay | Admitting: Family Medicine

## 2023-01-30 DIAGNOSIS — Z992 Dependence on renal dialysis: Secondary | ICD-10-CM | POA: Diagnosis not present

## 2023-01-30 DIAGNOSIS — M79604 Pain in right leg: Secondary | ICD-10-CM

## 2023-01-30 DIAGNOSIS — R52 Pain, unspecified: Secondary | ICD-10-CM

## 2023-01-30 DIAGNOSIS — N186 End stage renal disease: Secondary | ICD-10-CM | POA: Diagnosis not present

## 2023-01-30 NOTE — Telephone Encounter (Signed)
Medication Refill -  Most Recent Primary Care Visit:  Provider: [name]  Department: [facility]  Visit Type: OFFICE VISIT  Date: 09/14/2022  Medication:  oxyCODONE (OXY IR/ROXICODONE) 5 MG immediate release tablet *runs out in 2 days   Has the patient contacted their pharmacy? Yes, patient told me he always has to contact PCP for this medication   Patient's Pharmacy:  [facility] [address], [place] - [address] [place] [address] Phone: [phone] Fax: [phone]   Has the prescription been filled recently?  It shows on my end 10.11.2024?  Is the patient out of the medication? He runs out of medication in 2 days  Has the patient been seen for an appointment in the last year OR does the patient have an upcoming appointment? Yes. Last seen by PCP on 6.21.2024

## 2023-01-31 ENCOUNTER — Other Ambulatory Visit: Payer: Medicare HMO

## 2023-01-31 DIAGNOSIS — Z992 Dependence on renal dialysis: Secondary | ICD-10-CM | POA: Diagnosis not present

## 2023-01-31 DIAGNOSIS — N186 End stage renal disease: Secondary | ICD-10-CM | POA: Diagnosis not present

## 2023-01-31 NOTE — Telephone Encounter (Signed)
Requested medications are due for refill today.  yes  Requested medications are on the active medications list.  yes  Last refill. 01/04/2023 #240 0 rf  Future visit scheduled.   no  Notes to clinic.  Refill not delegated.    Requested Prescriptions  Pending Prescriptions Disp Refills   oxyCODONE (OXY IR/ROXICODONE) 5 MG immediate release tablet 240 tablet 0    Sig: Take 1-2 tablets (5-10 mg total) by mouth every 6 (six) hours as needed for severe pain (pain score 7-10).     Not Delegated - Analgesics:  Opioid Agonists Failed - 01/30/2023  1:49 PM      Failed - This refill cannot be delegated      Failed - Urine Drug Screen completed in last 360 days      Failed - Valid encounter within last 3 months    Recent Outpatient Visits           4 months ago Urinary hesitancy   [facility] [name], MD   11 months ago Urinary hesitancy   [facility] [name], MD   1 year ago Primary hypertension    [facility] [name], MD   2 years ago Type 2 diabetes mellitus with diabetic nephropathy, without long-term current use of insulin (HCC)    [facility] [name], MD   2 years ago Type 2 diabetes mellitus with diabetic nephropathy, without long-term current use of insulin (HCC)    [facility] Sherrie Mustache, Demetrios Isaacs, MD       Future Appointments             In 9 months Stoioff, Verna Czech, MD [facility] [place]

## 2023-02-01 DIAGNOSIS — Z992 Dependence on renal dialysis: Secondary | ICD-10-CM | POA: Diagnosis not present

## 2023-02-01 DIAGNOSIS — N186 End stage renal disease: Secondary | ICD-10-CM | POA: Diagnosis not present

## 2023-02-02 ENCOUNTER — Other Ambulatory Visit: Payer: Self-pay | Admitting: Family Medicine

## 2023-02-02 DIAGNOSIS — Z992 Dependence on renal dialysis: Secondary | ICD-10-CM | POA: Diagnosis not present

## 2023-02-02 DIAGNOSIS — L905 Scar conditions and fibrosis of skin: Secondary | ICD-10-CM

## 2023-02-02 DIAGNOSIS — M79604 Pain in right leg: Secondary | ICD-10-CM

## 2023-02-02 DIAGNOSIS — N186 End stage renal disease: Secondary | ICD-10-CM | POA: Diagnosis not present

## 2023-02-02 DIAGNOSIS — R52 Pain, unspecified: Secondary | ICD-10-CM

## 2023-02-02 MED ORDER — OXYCODONE HCL 5 MG PO TABS: 5.0000 mg | ORAL_TABLET | Freq: Four times a day (QID) | ORAL | 0 refills | Status: DC | PRN

## 2023-02-03 DIAGNOSIS — Z992 Dependence on renal dialysis: Secondary | ICD-10-CM | POA: Diagnosis not present

## 2023-02-03 DIAGNOSIS — N186 End stage renal disease: Secondary | ICD-10-CM | POA: Diagnosis not present

## 2023-02-04 ENCOUNTER — Ambulatory Visit
Admission: RE | Admit: 2023-02-04 | Discharge: 2023-02-04 | Disposition: A | Discharge status: Home / Self Care | Payer: Medicare HMO | Attending: Vascular Surgery | Admitting: Vascular Surgery

## 2023-02-04 ENCOUNTER — Telehealth: Payer: Self-pay | Admitting: Family Medicine

## 2023-02-04 ENCOUNTER — Encounter: Payer: Self-pay | Admitting: Vascular Surgery

## 2023-02-04 ENCOUNTER — Encounter
Admission: RE | Disposition: A | Discharge status: Home / Self Care | Payer: Self-pay | Source: Home / Self Care | Attending: Vascular Surgery

## 2023-02-04 ENCOUNTER — Other Ambulatory Visit: Payer: Self-pay

## 2023-02-04 ENCOUNTER — Other Ambulatory Visit: Payer: Self-pay | Admitting: Family Medicine

## 2023-02-04 DIAGNOSIS — Z992 Dependence on renal dialysis: Secondary | ICD-10-CM | POA: Diagnosis not present

## 2023-02-04 DIAGNOSIS — E1122 Type 2 diabetes mellitus with diabetic chronic kidney disease: Secondary | ICD-10-CM | POA: Insufficient documentation

## 2023-02-04 DIAGNOSIS — Z79899 Other long term (current) drug therapy: Secondary | ICD-10-CM | POA: Insufficient documentation

## 2023-02-04 DIAGNOSIS — I120 Hypertensive chronic kidney disease with stage 5 chronic kidney disease or end stage renal disease: Secondary | ICD-10-CM | POA: Insufficient documentation

## 2023-02-04 DIAGNOSIS — N186 End stage renal disease: Secondary | ICD-10-CM | POA: Diagnosis not present

## 2023-02-04 DIAGNOSIS — Z940 Kidney transplant status: Secondary | ICD-10-CM | POA: Diagnosis not present

## 2023-02-04 DIAGNOSIS — M79604 Pain in right leg: Secondary | ICD-10-CM

## 2023-02-04 DIAGNOSIS — Y839 Surgical procedure, unspecified as the cause of abnormal reaction of the patient, or of later complication, without mention of misadventure at the time of the procedure: Secondary | ICD-10-CM | POA: Diagnosis not present

## 2023-02-04 DIAGNOSIS — T8612 Kidney transplant failure: Secondary | ICD-10-CM | POA: Insufficient documentation

## 2023-02-04 DIAGNOSIS — F17210 Nicotine dependence, cigarettes, uncomplicated: Secondary | ICD-10-CM | POA: Diagnosis not present

## 2023-02-04 DIAGNOSIS — L905 Scar conditions and fibrosis of skin: Secondary | ICD-10-CM

## 2023-02-04 HISTORY — PX: DIALYSIS/PERMA CATHETER INSERTION: CATH118288

## 2023-02-04 LAB — GLUCOSE, CAPILLARY: Glucose-Capillary: 199 mg/dL — ABNORMAL HIGH (ref 70–99)

## 2023-02-04 LAB — POTASSIUM (ARMC VASCULAR LAB ONLY): Potassium (ARMC vascular lab): 4.2 mmol/L (ref 3.5–5.1)

## 2023-02-04 SURGERY — DIALYSIS/PERMA CATHETER INSERTION
Anesthesia: Moderate Sedation

## 2023-02-04 MED ORDER — HEPARIN SODIUM (PORCINE) 10000 UNIT/ML IJ SOLN
INTRAMUSCULAR | Status: AC
  Filled 2023-02-04: qty 1

## 2023-02-04 MED ORDER — HYDROMORPHONE HCL 1 MG/ML IJ SOLN: 1.0000 mg | Freq: Once | INTRAMUSCULAR | Status: DC | PRN

## 2023-02-04 MED ORDER — MIDAZOLAM HCL 2 MG/2ML IJ SOLN
INTRAMUSCULAR | Status: AC
  Filled 2023-02-04: qty 2

## 2023-02-04 MED ORDER — SODIUM CHLORIDE 0.9 % IV SOLN: INTRAVENOUS | Status: DC

## 2023-02-04 MED ORDER — FENTANYL CITRATE (PF) 100 MCG/2ML IJ SOLN
INTRAMUSCULAR | Status: DC | PRN
  Administered 2023-02-04: 50 ug via INTRAVENOUS

## 2023-02-04 MED ORDER — FAMOTIDINE 20 MG PO TABS: 40.0000 mg | ORAL_TABLET | Freq: Once | ORAL | Status: DC | PRN

## 2023-02-04 MED ORDER — CEFAZOLIN SODIUM-DEXTROSE 1-4 GM/50ML-% IV SOLN
1.0000 g | INTRAVENOUS | Status: AC
Start: 2023-02-04 — End: 2023-02-04
  Administered 2023-02-04: 1 g via INTRAVENOUS

## 2023-02-04 MED ORDER — ONDANSETRON HCL 4 MG/2ML IJ SOLN: 4.0000 mg | Freq: Four times a day (QID) | INTRAMUSCULAR | Status: DC | PRN

## 2023-02-04 MED ORDER — HEPARIN SODIUM (PORCINE) 10000 UNIT/ML IJ SOLN
INTRAMUSCULAR | Status: DC | PRN
  Administered 2023-02-04: 10000 [IU]

## 2023-02-04 MED ORDER — METHYLPREDNISOLONE SODIUM SUCC 125 MG IJ SOLR: 125.0000 mg | Freq: Once | INTRAMUSCULAR | Status: DC | PRN

## 2023-02-04 MED ORDER — HEPARIN (PORCINE) IN NACL 1000-0.9 UT/500ML-% IV SOLN
INTRAVENOUS | Status: DC | PRN
  Administered 2023-02-04: 500 mL

## 2023-02-04 MED ORDER — MIDAZOLAM HCL 2 MG/ML PO SYRP
ORAL_SOLUTION | ORAL | Status: AC
  Administered 2023-02-04: 8 mg via ORAL
  Filled 2023-02-04: qty 5

## 2023-02-04 MED ORDER — MIDAZOLAM HCL 2 MG/ML PO SYRP: 8.0000 mg | ORAL_SOLUTION | Freq: Once | ORAL | Status: AC | PRN

## 2023-02-04 MED ORDER — FENTANYL CITRATE PF 50 MCG/ML IJ SOSY
PREFILLED_SYRINGE | INTRAMUSCULAR | Status: AC
  Filled 2023-02-04: qty 1

## 2023-02-04 MED ORDER — DIPHENHYDRAMINE HCL 50 MG/ML IJ SOLN: 50.0000 mg | Freq: Once | INTRAMUSCULAR | Status: DC | PRN

## 2023-02-04 MED ORDER — CEFAZOLIN SODIUM-DEXTROSE 1-4 GM/50ML-% IV SOLN
INTRAVENOUS | Status: AC
Start: 2023-02-04 — End: ?
  Filled 2023-02-04: qty 50

## 2023-02-04 MED ORDER — MIDAZOLAM HCL 2 MG/2ML IJ SOLN
INTRAMUSCULAR | Status: DC | PRN
  Administered 2023-02-04: 2 mg via INTRAVENOUS

## 2023-02-04 MED ORDER — LIDOCAINE-EPINEPHRINE (PF) 1 %-1:200000 IJ SOLN
INTRAMUSCULAR | Status: DC | PRN
  Administered 2023-02-04: 20 mL

## 2023-02-04 SURGICAL SUPPLY — 6 items
ADH SKN CLS APL DERMABOND .7 (GAUZE/BANDAGES/DRESSINGS) ×1
CATH CANNON HEMO 15FR 19 (HEMODIALYSIS SUPPLIES) IMPLANT
COVER PROBE ULTRASOUND 5X96 (MISCELLANEOUS) IMPLANT
DERMABOND ADVANCED .7 DNX12 (GAUZE/BANDAGES/DRESSINGS) IMPLANT
SUT MNCRL AB 4-0 PS2 18 (SUTURE) IMPLANT
SUT PROLENE 0 CT 1 30 (SUTURE) IMPLANT

## 2023-02-04 NOTE — H&P (Signed)
[facility] Admission History & Physical  MRN : [id number]  James Moreno is a 57 y.o. (November 24, 1965) male who presents with chief complaint of No chief complaint on file. Marland Kitchen  History of Present Illness: We are asked to see the patient by his nephrologist Dr. Cherylann Ratel for placement of a PermCath for initiation of dialysis.  The patient has a previous history of renal transplant but is had gradual decline in function and now has a GFR less than 10 and is going to initiate renal replacement therapy.  He is previously done peritoneal dialysis in the past.  No fevers or chills.  No chest pain or shortness of breath  No current facility-administered medications for this encounter.    Past Medical History:  Diagnosis Date   Acute kidney failure, unspecified (HCC)    Anxiety    Diabetes mellitus, type 2 (HCC)    GERD (gastroesophageal reflux disease)    History of arterial disease of lower extremity    History of hepatitis B    Hypertension    Hypothyroidism    Mitral valve disorders(424.0)    Pityriasis 12/27/2014   Primary pulmonary HTN (HCC)    Pt denies ever having.   Tricuspid valve disorders, specified as nonrheumatic     Past Surgical History:  Procedure Laterality Date   APPENDECTOMY     Carotid Doppler Ultrasound  06/14/2009   39% stenosis of bilateral internal carotid artery, bilateral anterograde vertebral flow   ESOPHAGOGASTRODUODENOSCOPY (EGD) WITH PROPOFOL N/A 01/21/2019   Procedure: ESOPHAGOGASTRODUODENOSCOPY (EGD) WITH PROPOFOL;  Surgeon: Toney Reil, MD;  Location: [facility];  Service: Endoscopy;  Laterality: N/A;  Diabetic - insulin   EYE SURGERY     right   HEMORROIDECTOMY     KIDNEY TRANSPLANT Right 2016   MECKEL DIVERTICULUM EXCISION     infancy   Myocardial Perfusion scan  01/17/2009   [facility], non- ischemic. LVEF= 55%   PARS PLANA VITRECTOMY Left 02/09/2015   Procedure: Pan retinal photocoagulation 15176;   Surgeon: Marcelene Butte, MD;  Location: [facility];  Service: Ophthalmology;  Laterality: Left;   REFRACTIVE SURGERY Left    sleep study  01/09/2011   Severe sleep apnea. AHI 72.9/hr. RDI=83.0/hr. Desaturation to 69.0% Emergency CPAP titaration to 14.0cm (01/14/19 resolved after wt loss after kidney transplant.)     Social History   Tobacco Use   Smoking status: Every Day    Current packs/day: 1.00    Average packs/day: 1 pack/day for 41.0 years (41.0 ttl pk-yrs)    Types: Cigarettes   Smokeless tobacco: Never   Tobacco comments:    since age 47.    Smokes 1/2 PPD; decreased on 12/22/2022.   Vaping Use   Vaping status: Former  Substance Use Topics   Alcohol use: Not Currently    Alcohol/week: 0.0 - 1.0 standard drinks of alcohol    Comment: Excessive alcohol consumption in the past. Quit around 2016. .   Drug use: No     Family History  Problem Relation Age of Onset   Hypertension Mother    Hyperlipidemia Mother    Melanoma Father     Allergies  Allergen Reactions   No Known Allergies      REVIEW OF SYSTEMS (Negative unless checked)  Constitutional: [] Weight loss  [] Fever  [] Chills Cardiac: [] Chest pain   [] Chest pressure   [] Palpitations   [] Shortness of breath when laying flat   [] Shortness of breath at rest   [x] Shortness of  breath with exertion. Vascular:  [] Pain in legs with walking   [] Pain in legs at rest   [] Pain in legs when laying flat   [] Claudication   [] Pain in feet when walking  [] Pain in feet at rest  [] Pain in feet when laying flat   [] History of DVT   [] Phlebitis   [] Swelling in legs   [] Varicose veins   [] Non-healing ulcers Pulmonary:   [] Uses home oxygen   [] Productive cough   [] Hemoptysis   [] Wheeze  [] COPD   [] Asthma Neurologic:  [] Dizziness  [] Blackouts   [] Seizures   [] History of stroke   [] History of TIA  [] Aphasia   [] Temporary blindness   [] Dysphagia   [] Weakness or numbness in arms   [] Weakness or numbness in legs Musculoskeletal:   [x] Arthritis   [] Joint swelling   [] Joint pain   [] Low back pain Hematologic:  [] Easy bruising  [] Easy bleeding   [] Hypercoagulable state   [] Anemic  [] Hepatitis Gastrointestinal:  [] Blood in stool   [] Vomiting blood  [x] Gastroesophageal reflux/heartburn   [] Difficulty swallowing. Genitourinary:  [x] Chronic kidney disease   [] Difficult urination  [] Frequent urination  [] Burning with urination   [] Blood in urine Skin:  [] Rashes   [] Ulcers   [] Wounds Psychological:  [x] History of anxiety   []  History of major depression.  Physical Examination  There were no vitals filed for this visit. There is no height or weight on file to calculate BMI. Gen: WD/WN, NAD Head: [place]/AT, No temporalis wasting.  Ear/Nose/Throat: Hearing grossly intact, nares w/o erythema or drainage, oropharynx w/o Erythema/Exudate,  Eyes: Conjunctiva clear, sclera non-icteric Neck: Trachea midline.  No JVD.  Pulmonary:  Good air movement, respirations not labored, no use of accessory muscles.  Cardiac: RRR, normal S1, S2. Vascular:  Vessel Right Left  Radial Palpable Palpable               Musculoskeletal: M/S 5/5 throughout.  Extremities without ischemic changes.  No deformity or atrophy.  Neurologic: Sensation grossly intact in extremities.  Symmetrical.  Speech is fluent. Motor exam as listed above. Psychiatric: Judgment intact, Mood & affect appropriate for pt's clinical situation. Dermatologic: No rashes or ulcers noted.  No cellulitis or open wounds.      CBC Lab Results  Component Value Date   WBC 6.7 12/12/2018   HGB 14.8 12/12/2018   HCT 43 12/12/2018   MCV 92.6 10/21/2015   PLT 182 12/12/2018    BMET    Component Value Date/Time   NA 130 (L) 02/12/2022 1414   NA 132 (L) 06/21/2013 1429   K 3.9 02/12/2022 1414   K 4.1 06/21/2013 1429   CL 93 (L) 02/12/2022 1414   CL 100 06/21/2013 1429   CO2 20 02/12/2022 1414   CO2 21 06/21/2013 1429   GLUCOSE 165 (H) 02/12/2022 1414   GLUCOSE 205 (H)  10/21/2015 1310   GLUCOSE 196 (H) 06/21/2013 1429   BUN 36 (H) 02/12/2022 1414   BUN 55 (H) 06/21/2013 1429   CREATININE 2.81 (H) 02/12/2022 1414   CREATININE 9.88 (H) 06/21/2013 1429   CALCIUM 9.8 02/12/2022 1414   CALCIUM 7.3 (L) 06/21/2013 1429   GFRNONAA 39 (L) 09/17/2019 1417   GFRNONAA 6 (L) 06/21/2013 1429   GFRAA 45 (L) 09/17/2019 1417   GFRAA 6 (L) 06/21/2013 1429   CrCl cannot be calculated (Patient's most recent lab result is older than the maximum 21 days allowed.).  COAG No results found for: "INR", "PROTIME"  Radiology No results found.   Assessment/Plan  1.  ESRD.  Status post renal transplant which is now failing and he needs PermCath placement for initiation of renal replacement therapy.  Risks and benefits are discussed and patient is agreeable to proceed. 2.  Diabetes.  An underlying cause of his renal insufficiency and blood glucose control important in reducing the progression of atherosclerotic disease. Also, involved in wound healing. On appropriate medications. 3.  Hypertension.  An underlying cause of his renal insufficiency and blood pressure control important in reducing the progression of atherosclerotic disease. On appropriate oral medications.    Festus Barren, MD  02/04/2023 12:34 PM

## 2023-02-04 NOTE — Telephone Encounter (Signed)
Pt's significant other is wanting to have this done immediately since last RX was only one weeks worth.

## 2023-02-04 NOTE — Telephone Encounter (Signed)
Pt is needing a written prescription for his pain medication Oxycodone.  They want to come by and pick it up before five.  The patient is in recovery right now at the hospital but should be leaving soon.  CB#  [phone]

## 2023-02-04 NOTE — Op Note (Signed)
OPERATIVE NOTE    PRE-OPERATIVE DIAGNOSIS: 1. ESRD 2.  Previous renal transplant with failure now requiring renal replacement therapy  POST-OPERATIVE DIAGNOSIS: same as above  PROCEDURE: Ultrasound guidance for vascular access to the right internal jugular vein Fluoroscopic guidance for placement of catheter Placement of a 19 cm tip to cuff tunneled hemodialysis catheter via the right internal jugular vein  SURGEON: Festus Barren, MD  ANESTHESIA:  Local with Moderate conscious sedation for approximately 25 minutes using 2 mg of Versed and 75 mcg of Fentanyl  ESTIMATED BLOOD LOSS: 15 cc  FLUORO TIME: less than one minute  CONTRAST: none  FINDING(S): 1.  Patent right internal jugular vein  SPECIMEN(S):  None  INDICATIONS:   James Moreno is a 57 y.o.male who presents with a history of end-stage renal disease.  He has had a previous renal transplant but that is failing and he now requires a new catheter for initiation of renal replacement therapy.  The patient needs long term dialysis access for their ESRD, and a Permcath is necessary.  Risks and benefits are discussed and informed consent is obtained.    DESCRIPTION: After obtaining full informed written consent, the patient was brought back to the vascular suited. The patient's right neck and chest were sterilely prepped and draped in a sterile surgical field was created. Moderate conscious sedation was administered during a face to face encounter with the patient throughout the procedure with my supervision of the RN administering medicines and monitoring the patient's vital signs, pulse oximetry, telemetry and mental status throughout from the start of the procedure until the patient was taken to the recovery room.  The right internal jugular vein was visualized with ultrasound and found to be patent. It was then accessed under direct ultrasound guidance and a permanent image was recorded. A wire was placed. After skin nick and  dilatation, the peel-away sheath was placed over the wire. I then turned my attention to an area under the clavicle. Approximately 1-2 fingerbreadths below the clavicle a small counterincision was created and tunneled from the subclavicular incision to the access site. Using fluoroscopic guidance, a 19 centimeter tip to cuff tunneled hemodialysis catheter was selected, and tunneled from the subclavicular incision to the access site. It was then placed through the peel-away sheath and the peel-away sheath was removed. Using fluoroscopic guidance the catheter tips were parked in the right atrium. The appropriate distal connectors were placed. It withdrew blood well and flushed easily with heparinized saline and a concentrated heparin solution was then placed. It was secured to the chest wall with 2 Prolene sutures. The access incision was closed single 4-0 Monocryl. A 4-0 Monocryl pursestring suture was placed around the exit site. Sterile dressings were placed. The patient tolerated the procedure well and was taken to the recovery room in stable condition.  COMPLICATIONS: None  CONDITION: Stable  Festus Barren, MD 02/04/2023 2:20 PM   This note was created with Dragon Medical transcription system. Any errors in dictation are purely unintentional.

## 2023-02-04 NOTE — Telephone Encounter (Signed)
Pt's caregiver caregiver came by to let provider know he is in hospital to have port placed.

## 2023-02-05 ENCOUNTER — Encounter: Payer: Self-pay | Admitting: Vascular Surgery

## 2023-02-05 DIAGNOSIS — Z992 Dependence on renal dialysis: Secondary | ICD-10-CM | POA: Diagnosis not present

## 2023-02-05 DIAGNOSIS — Z1159 Encounter for screening for other viral diseases: Secondary | ICD-10-CM | POA: Diagnosis not present

## 2023-02-05 DIAGNOSIS — E119 Type 2 diabetes mellitus without complications: Secondary | ICD-10-CM | POA: Diagnosis not present

## 2023-02-05 DIAGNOSIS — N186 End stage renal disease: Secondary | ICD-10-CM | POA: Diagnosis not present

## 2023-02-05 MED ORDER — OXYCODONE HCL 5 MG PO TABS: 5.0000 mg | ORAL_TABLET | Freq: Four times a day (QID) | ORAL | 0 refills | Status: DC | PRN

## 2023-02-05 NOTE — Telephone Encounter (Signed)
Fill request sent to provider.

## 2023-02-06 ENCOUNTER — Telehealth: Payer: Self-pay | Admitting: Family Medicine

## 2023-02-06 DIAGNOSIS — Z992 Dependence on renal dialysis: Secondary | ICD-10-CM | POA: Diagnosis not present

## 2023-02-06 DIAGNOSIS — N186 End stage renal disease: Secondary | ICD-10-CM | POA: Diagnosis not present

## 2023-02-06 NOTE — Telephone Encounter (Signed)
Followed up with patient.Marland KitchenMarland KitchenPatient stated that when his daughter came by to pick up his medicine that someone told her we did not have his medicine. Patient states that he will pick up medicine on 02/07/2023 after his dialysis appointment.

## 2023-02-07 DIAGNOSIS — N186 End stage renal disease: Secondary | ICD-10-CM | POA: Diagnosis not present

## 2023-02-07 DIAGNOSIS — Z992 Dependence on renal dialysis: Secondary | ICD-10-CM | POA: Diagnosis not present

## 2023-02-08 DIAGNOSIS — N186 End stage renal disease: Secondary | ICD-10-CM | POA: Diagnosis not present

## 2023-02-08 DIAGNOSIS — Z992 Dependence on renal dialysis: Secondary | ICD-10-CM | POA: Diagnosis not present

## 2023-02-09 DIAGNOSIS — N186 End stage renal disease: Secondary | ICD-10-CM | POA: Diagnosis not present

## 2023-02-09 DIAGNOSIS — Z992 Dependence on renal dialysis: Secondary | ICD-10-CM | POA: Diagnosis not present

## 2023-02-10 DIAGNOSIS — N186 End stage renal disease: Secondary | ICD-10-CM | POA: Diagnosis not present

## 2023-02-10 DIAGNOSIS — Z992 Dependence on renal dialysis: Secondary | ICD-10-CM | POA: Diagnosis not present

## 2023-02-11 DIAGNOSIS — N186 End stage renal disease: Secondary | ICD-10-CM | POA: Diagnosis not present

## 2023-02-11 DIAGNOSIS — Z992 Dependence on renal dialysis: Secondary | ICD-10-CM | POA: Diagnosis not present

## 2023-02-12 DIAGNOSIS — Z992 Dependence on renal dialysis: Secondary | ICD-10-CM | POA: Diagnosis not present

## 2023-02-12 DIAGNOSIS — N186 End stage renal disease: Secondary | ICD-10-CM | POA: Diagnosis not present

## 2023-02-13 DIAGNOSIS — Z992 Dependence on renal dialysis: Secondary | ICD-10-CM | POA: Diagnosis not present

## 2023-02-13 DIAGNOSIS — N186 End stage renal disease: Secondary | ICD-10-CM | POA: Diagnosis not present

## 2023-02-14 DIAGNOSIS — Z992 Dependence on renal dialysis: Secondary | ICD-10-CM | POA: Diagnosis not present

## 2023-02-14 DIAGNOSIS — N186 End stage renal disease: Secondary | ICD-10-CM | POA: Diagnosis not present

## 2023-02-15 DIAGNOSIS — N186 End stage renal disease: Secondary | ICD-10-CM | POA: Diagnosis not present

## 2023-02-15 DIAGNOSIS — Z992 Dependence on renal dialysis: Secondary | ICD-10-CM | POA: Diagnosis not present

## 2023-02-16 DIAGNOSIS — Z992 Dependence on renal dialysis: Secondary | ICD-10-CM | POA: Diagnosis not present

## 2023-02-16 DIAGNOSIS — N186 End stage renal disease: Secondary | ICD-10-CM | POA: Diagnosis not present

## 2023-02-17 DIAGNOSIS — Z992 Dependence on renal dialysis: Secondary | ICD-10-CM | POA: Diagnosis not present

## 2023-02-17 DIAGNOSIS — E1129 Type 2 diabetes mellitus with other diabetic kidney complication: Secondary | ICD-10-CM | POA: Diagnosis not present

## 2023-02-17 DIAGNOSIS — N186 End stage renal disease: Secondary | ICD-10-CM | POA: Diagnosis not present

## 2023-02-18 ENCOUNTER — Inpatient Hospital Stay: Payer: Medicare HMO

## 2023-02-18 ENCOUNTER — Emergency Department: Payer: Medicare HMO

## 2023-02-18 ENCOUNTER — Encounter: Admission: AD | Disposition: E | Payer: Self-pay | Source: Home / Self Care | Attending: Pulmonary Disease

## 2023-02-18 ENCOUNTER — Ambulatory Visit: Payer: Medicare HMO

## 2023-02-18 ENCOUNTER — Inpatient Hospital Stay
Admission: AD | Admit: 2023-02-18 | Discharge: 2023-02-24 | DRG: 321 | Disposition: E | Payer: Medicare HMO | Attending: Pulmonary Disease | Admitting: Pulmonary Disease

## 2023-02-18 ENCOUNTER — Encounter: Payer: Self-pay | Admitting: Cardiology

## 2023-02-18 DIAGNOSIS — S42001A Fracture of unspecified part of right clavicle, initial encounter for closed fracture: Secondary | ICD-10-CM | POA: Diagnosis not present

## 2023-02-18 DIAGNOSIS — E119 Type 2 diabetes mellitus without complications: Secondary | ICD-10-CM | POA: Diagnosis not present

## 2023-02-18 DIAGNOSIS — R0902 Hypoxemia: Secondary | ICD-10-CM | POA: Diagnosis not present

## 2023-02-18 DIAGNOSIS — N186 End stage renal disease: Secondary | ICD-10-CM

## 2023-02-18 DIAGNOSIS — I420 Dilated cardiomyopathy: Secondary | ICD-10-CM | POA: Diagnosis not present

## 2023-02-18 DIAGNOSIS — E669 Obesity, unspecified: Secondary | ICD-10-CM | POA: Diagnosis present

## 2023-02-18 DIAGNOSIS — E875 Hyperkalemia: Secondary | ICD-10-CM | POA: Diagnosis present

## 2023-02-18 DIAGNOSIS — R570 Cardiogenic shock: Secondary | ICD-10-CM

## 2023-02-18 DIAGNOSIS — J9600 Acute respiratory failure, unspecified whether with hypoxia or hypercapnia: Secondary | ICD-10-CM | POA: Diagnosis not present

## 2023-02-18 DIAGNOSIS — Z83438 Family history of other disorder of lipoprotein metabolism and other lipidemia: Secondary | ICD-10-CM

## 2023-02-18 DIAGNOSIS — I5021 Acute systolic (congestive) heart failure: Secondary | ICD-10-CM | POA: Insufficient documentation

## 2023-02-18 DIAGNOSIS — I2510 Atherosclerotic heart disease of native coronary artery without angina pectoris: Secondary | ICD-10-CM | POA: Diagnosis present

## 2023-02-18 DIAGNOSIS — I2102 ST elevation (STEMI) myocardial infarction involving left anterior descending coronary artery: Secondary | ICD-10-CM | POA: Diagnosis not present

## 2023-02-18 DIAGNOSIS — Z515 Encounter for palliative care: Secondary | ICD-10-CM

## 2023-02-18 DIAGNOSIS — E1122 Type 2 diabetes mellitus with diabetic chronic kidney disease: Secondary | ICD-10-CM | POA: Diagnosis present

## 2023-02-18 DIAGNOSIS — E1121 Type 2 diabetes mellitus with diabetic nephropathy: Secondary | ICD-10-CM | POA: Diagnosis present

## 2023-02-18 DIAGNOSIS — G931 Anoxic brain damage, not elsewhere classified: Secondary | ICD-10-CM

## 2023-02-18 DIAGNOSIS — I462 Cardiac arrest due to underlying cardiac condition: Secondary | ICD-10-CM | POA: Diagnosis present

## 2023-02-18 DIAGNOSIS — Z9911 Dependence on respirator [ventilator] status: Secondary | ICD-10-CM

## 2023-02-18 DIAGNOSIS — Z992 Dependence on renal dialysis: Secondary | ICD-10-CM | POA: Diagnosis not present

## 2023-02-18 DIAGNOSIS — R918 Other nonspecific abnormal finding of lung field: Secondary | ICD-10-CM | POA: Diagnosis not present

## 2023-02-18 DIAGNOSIS — R569 Unspecified convulsions: Secondary | ICD-10-CM | POA: Diagnosis not present

## 2023-02-18 DIAGNOSIS — Z7984 Long term (current) use of oral hypoglycemic drugs: Secondary | ICD-10-CM

## 2023-02-18 DIAGNOSIS — Z8249 Family history of ischemic heart disease and other diseases of the circulatory system: Secondary | ICD-10-CM

## 2023-02-18 DIAGNOSIS — I2109 ST elevation (STEMI) myocardial infarction involving other coronary artery of anterior wall: Secondary | ICD-10-CM | POA: Diagnosis not present

## 2023-02-18 DIAGNOSIS — N2581 Secondary hyperparathyroidism of renal origin: Secondary | ICD-10-CM | POA: Diagnosis not present

## 2023-02-18 DIAGNOSIS — K219 Gastro-esophageal reflux disease without esophagitis: Secondary | ICD-10-CM | POA: Diagnosis present

## 2023-02-18 DIAGNOSIS — D631 Anemia in chronic kidney disease: Secondary | ICD-10-CM | POA: Diagnosis not present

## 2023-02-18 DIAGNOSIS — J9601 Acute respiratory failure with hypoxia: Secondary | ICD-10-CM

## 2023-02-18 DIAGNOSIS — I779 Disorder of arteries and arterioles, unspecified: Secondary | ICD-10-CM | POA: Diagnosis present

## 2023-02-18 DIAGNOSIS — I213 ST elevation (STEMI) myocardial infarction of unspecified site: Secondary | ICD-10-CM

## 2023-02-18 DIAGNOSIS — T8612 Kidney transplant failure: Secondary | ICD-10-CM | POA: Diagnosis present

## 2023-02-18 DIAGNOSIS — G253 Myoclonus: Secondary | ICD-10-CM | POA: Diagnosis present

## 2023-02-18 DIAGNOSIS — E039 Hypothyroidism, unspecified: Secondary | ICD-10-CM

## 2023-02-18 DIAGNOSIS — I499 Cardiac arrhythmia, unspecified: Secondary | ICD-10-CM | POA: Diagnosis not present

## 2023-02-18 DIAGNOSIS — Z452 Encounter for adjustment and management of vascular access device: Secondary | ICD-10-CM | POA: Diagnosis not present

## 2023-02-18 DIAGNOSIS — Z79899 Other long term (current) drug therapy: Secondary | ICD-10-CM

## 2023-02-18 DIAGNOSIS — Z6831 Body mass index (BMI) 31.0-31.9, adult: Secondary | ICD-10-CM

## 2023-02-18 DIAGNOSIS — R7401 Elevation of levels of liver transaminase levels: Secondary | ICD-10-CM | POA: Diagnosis present

## 2023-02-18 DIAGNOSIS — Z66 Do not resuscitate: Secondary | ICD-10-CM | POA: Diagnosis not present

## 2023-02-18 DIAGNOSIS — G9341 Metabolic encephalopathy: Secondary | ICD-10-CM

## 2023-02-18 DIAGNOSIS — R001 Bradycardia, unspecified: Secondary | ICD-10-CM | POA: Diagnosis present

## 2023-02-18 DIAGNOSIS — I132 Hypertensive heart and chronic kidney disease with heart failure and with stage 5 chronic kidney disease, or end stage renal disease: Secondary | ICD-10-CM | POA: Diagnosis present

## 2023-02-18 DIAGNOSIS — R4182 Altered mental status, unspecified: Secondary | ICD-10-CM | POA: Diagnosis not present

## 2023-02-18 DIAGNOSIS — N390 Urinary tract infection, site not specified: Secondary | ICD-10-CM | POA: Diagnosis present

## 2023-02-18 DIAGNOSIS — E871 Hypo-osmolality and hyponatremia: Secondary | ICD-10-CM | POA: Diagnosis present

## 2023-02-18 DIAGNOSIS — F14129 Cocaine abuse with intoxication, unspecified: Secondary | ICD-10-CM | POA: Diagnosis present

## 2023-02-18 DIAGNOSIS — I469 Cardiac arrest, cause unspecified: Secondary | ICD-10-CM | POA: Diagnosis not present

## 2023-02-18 DIAGNOSIS — R000 Tachycardia, unspecified: Secondary | ICD-10-CM | POA: Diagnosis present

## 2023-02-18 DIAGNOSIS — I5023 Acute on chronic systolic (congestive) heart failure: Secondary | ICD-10-CM | POA: Diagnosis not present

## 2023-02-18 DIAGNOSIS — Z79624 Long term (current) use of inhibitors of nucleotide synthesis: Secondary | ICD-10-CM

## 2023-02-18 DIAGNOSIS — R079 Chest pain, unspecified: Secondary | ICD-10-CM | POA: Diagnosis not present

## 2023-02-18 DIAGNOSIS — I10 Essential (primary) hypertension: Secondary | ICD-10-CM | POA: Diagnosis not present

## 2023-02-18 DIAGNOSIS — Y830 Surgical operation with transplant of whole organ as the cause of abnormal reaction of the patient, or of later complication, without mention of misadventure at the time of the procedure: Secondary | ICD-10-CM | POA: Diagnosis present

## 2023-02-18 DIAGNOSIS — Z794 Long term (current) use of insulin: Secondary | ICD-10-CM | POA: Diagnosis not present

## 2023-02-18 DIAGNOSIS — R220 Localized swelling, mass and lump, head: Secondary | ICD-10-CM | POA: Diagnosis not present

## 2023-02-18 DIAGNOSIS — Z4682 Encounter for fitting and adjustment of non-vascular catheter: Secondary | ICD-10-CM | POA: Diagnosis not present

## 2023-02-18 DIAGNOSIS — I6782 Cerebral ischemia: Secondary | ICD-10-CM | POA: Diagnosis not present

## 2023-02-18 DIAGNOSIS — Z7189 Other specified counseling: Secondary | ICD-10-CM | POA: Diagnosis not present

## 2023-02-18 DIAGNOSIS — Z79621 Long term (current) use of calcineurin inhibitor: Secondary | ICD-10-CM

## 2023-02-18 DIAGNOSIS — R140 Abdominal distension (gaseous): Secondary | ICD-10-CM | POA: Diagnosis not present

## 2023-02-18 DIAGNOSIS — Z808 Family history of malignant neoplasm of other organs or systems: Secondary | ICD-10-CM

## 2023-02-18 DIAGNOSIS — Z7985 Long-term (current) use of injectable non-insulin antidiabetic drugs: Secondary | ICD-10-CM

## 2023-02-18 DIAGNOSIS — F17210 Nicotine dependence, cigarettes, uncomplicated: Secondary | ICD-10-CM | POA: Diagnosis present

## 2023-02-18 HISTORY — PX: LEFT HEART CATH AND CORONARY ANGIOGRAPHY: CATH118249

## 2023-02-18 HISTORY — PX: CORONARY/GRAFT ACUTE MI REVASCULARIZATION: CATH118305

## 2023-02-18 LAB — COOXEMETRY PANEL
Carboxyhemoglobin: 1.9 % — ABNORMAL HIGH (ref 0.5–1.5)
Carboxyhemoglobin: 1.2 % (ref 0.5–1.5)
Methemoglobin: <0.7 % (ref 0.0–1.5)
Methemoglobin: <0.7 % (ref 0.0–1.5)
O2 Saturation: 81.2 %
O2 Saturation: 98.8 %
Total hemoglobin: 9.8 g/dL — ABNORMAL LOW (ref 12.0–16.0)
Total hemoglobin: 10.5 g/dL — ABNORMAL LOW (ref 12.0–16.0)
Total oxygen content: 79.5 %
Total oxygen content: 97 %

## 2023-02-18 LAB — URINALYSIS, W/ REFLEX TO CULTURE (INFECTION SUSPECTED)
Bacteria, UA: NONE SEEN
Bilirubin Urine: NEGATIVE
Glucose, UA: >=500 mg/dL — AB
Ketones, ur: NEGATIVE mg/dL
Leukocytes,Ua: NEGATIVE
Nitrite: NEGATIVE
Protein, ur: >=300 mg/dL — AB
Specific Gravity, Urine: 1.026 (ref 1.005–1.030)
pH: 5 (ref 5.0–8.0)

## 2023-02-18 LAB — CBC
HCT: 31.1 % — ABNORMAL LOW (ref 39.0–52.0)
HCT: 31.4 % — ABNORMAL LOW (ref 39.0–52.0)
Hemoglobin: 9.3 g/dL — ABNORMAL LOW (ref 13.0–17.0)
Hemoglobin: 10.1 g/dL — ABNORMAL LOW (ref 13.0–17.0)
MCH: 30.3 pg (ref 26.0–34.0)
MCH: 30.3 pg (ref 26.0–34.0)
MCHC: 29.9 g/dL — ABNORMAL LOW (ref 30.0–36.0)
MCHC: 32.2 g/dL (ref 30.0–36.0)
MCV: 101.3 fL — ABNORMAL HIGH (ref 80.0–100.0)
MCV: 94.3 fL (ref 80.0–100.0)
Platelets: 181 10*3/uL (ref 150–400)
Platelets: 224 10*3/uL (ref 150–400)
RBC: 3.07 MIL/uL — ABNORMAL LOW (ref 4.22–5.81)
RBC: 3.33 MIL/uL — ABNORMAL LOW (ref 4.22–5.81)
RDW: 14.7 % (ref 11.5–15.5)
RDW: 14.6 % (ref 11.5–15.5)
WBC: 11.6 10*3/uL — ABNORMAL HIGH (ref 4.0–10.5)
WBC: 19.8 10*3/uL — ABNORMAL HIGH (ref 4.0–10.5)
nRBC: 0.2 % (ref 0.0–0.2)
nRBC: 0 % (ref 0.0–0.2)

## 2023-02-18 LAB — URINALYSIS, COMPLETE (UACMP) WITH MICROSCOPIC
Bilirubin Urine: NEGATIVE
Glucose, UA: >=500 mg/dL — AB
Ketones, ur: NEGATIVE mg/dL
Leukocytes,Ua: NEGATIVE
Nitrite: NEGATIVE
Protein, ur: >=300 mg/dL — AB
Specific Gravity, Urine: 1.026 (ref 1.005–1.030)
pH: 6 (ref 5.0–8.0)

## 2023-02-18 LAB — URINE DRUG SCREEN, QUALITATIVE (ARMC ONLY)
Amphetamines, Ur Screen: NOT DETECTED
Barbiturates, Ur Screen: NOT DETECTED
Benzodiazepine, Ur Scrn: NOT DETECTED
Cannabinoid 50 Ng, Ur ~~LOC~~: NOT DETECTED
Cocaine Metabolite,Ur ~~LOC~~: POSITIVE — AB
MDMA (Ecstasy)Ur Screen: NOT DETECTED
Methadone Scn, Ur: NOT DETECTED
Opiate, Ur Screen: NOT DETECTED
Phencyclidine (PCP) Ur S: NOT DETECTED
Tricyclic, Ur Screen: NOT DETECTED

## 2023-02-18 LAB — POCT I-STAT 7, (LYTES, BLD GAS, ICA,H+H)
Acid-Base Excess: 0 mmol/L (ref 0.0–2.0)
Bicarbonate: 25.6 mmol/L (ref 20.0–28.0)
Calcium, Ion: 1.34 mmol/L (ref 1.15–1.40)
HCT: 32 % — ABNORMAL LOW (ref 39.0–52.0)
Hemoglobin: 10.9 g/dL — ABNORMAL LOW (ref 13.0–17.0)
O2 Saturation: 100 %
Potassium: 5.1 mmol/L (ref 3.5–5.1)
Sodium: 130 mmol/L — ABNORMAL LOW (ref 135–145)
TCO2: 27 mmol/L (ref 22–32)
pCO2 arterial: 46.4 mm[Hg] (ref 32–48)
pH, Arterial: 7.349 — ABNORMAL LOW (ref 7.35–7.45)
pO2, Arterial: 358 mm[Hg] — ABNORMAL HIGH (ref 83–108)

## 2023-02-18 LAB — BASIC METABOLIC PANEL
Anion gap: 12 (ref 5–15)
Anion gap: 8 (ref 5–15)
Anion gap: 10 (ref 5–15)
Anion gap: 10 (ref 5–15)
BUN: 39 mg/dL — ABNORMAL HIGH (ref 6–20)
BUN: 52 mg/dL — ABNORMAL HIGH (ref 6–20)
BUN: 35 mg/dL — ABNORMAL HIGH (ref 6–20)
BUN: 32 mg/dL — ABNORMAL HIGH (ref 6–20)
CO2: 23 mmol/L (ref 22–32)
CO2: 24 mmol/L (ref 22–32)
CO2: 26 mmol/L (ref 22–32)
CO2: 26 mmol/L (ref 22–32)
Calcium: 9.7 mg/dL (ref 8.9–10.3)
Calcium: 7.7 mg/dL — ABNORMAL LOW (ref 8.9–10.3)
Calcium: 8.1 mg/dL — ABNORMAL LOW (ref 8.9–10.3)
Calcium: 8 mg/dL — ABNORMAL LOW (ref 8.9–10.3)
Chloride: 93 mmol/L — ABNORMAL LOW (ref 98–111)
Chloride: 99 mmol/L (ref 98–111)
Chloride: 98 mmol/L (ref 98–111)
Chloride: 97 mmol/L — ABNORMAL LOW (ref 98–111)
Creatinine, Ser: 4.36 mg/dL — ABNORMAL HIGH (ref 0.61–1.24)
Creatinine, Ser: 4.55 mg/dL — ABNORMAL HIGH (ref 0.61–1.24)
Creatinine, Ser: 2.95 mg/dL — ABNORMAL HIGH (ref 0.61–1.24)
Creatinine, Ser: 2.98 mg/dL — ABNORMAL HIGH (ref 0.61–1.24)
GFR, Estimated: 15 mL/min — ABNORMAL LOW (ref >60)
GFR, Estimated: 14 mL/min — ABNORMAL LOW (ref >60)
GFR, Estimated: 24 mL/min — ABNORMAL LOW (ref >60)
GFR, Estimated: 24 mL/min — ABNORMAL LOW (ref >60)
Glucose, Bld: 621 mg/dL (ref 70–99)
Glucose, Bld: 300 mg/dL — ABNORMAL HIGH (ref 70–99)
Glucose, Bld: 135 mg/dL — ABNORMAL HIGH (ref 70–99)
Glucose, Bld: 128 mg/dL — ABNORMAL HIGH (ref 70–99)
Potassium: 3.4 mmol/L — ABNORMAL LOW (ref 3.5–5.1)
Potassium: 5.4 mmol/L — ABNORMAL HIGH (ref 3.5–5.1)
Potassium: 3.7 mmol/L (ref 3.5–5.1)
Potassium: 3.6 mmol/L (ref 3.5–5.1)
Sodium: 128 mmol/L — ABNORMAL LOW (ref 135–145)
Sodium: 131 mmol/L — ABNORMAL LOW (ref 135–145)
Sodium: 134 mmol/L — ABNORMAL LOW (ref 135–145)
Sodium: 133 mmol/L — ABNORMAL LOW (ref 135–145)

## 2023-02-18 LAB — BRAIN NATRIURETIC PEPTIDE: B Natriuretic Peptide: 1575.2 pg/mL — ABNORMAL HIGH (ref 0.0–100.0)

## 2023-02-18 LAB — GLUCOSE, CAPILLARY
Glucose-Capillary: 108 mg/dL — ABNORMAL HIGH (ref 70–99)
Glucose-Capillary: 128 mg/dL — ABNORMAL HIGH (ref 70–99)
Glucose-Capillary: 150 mg/dL — ABNORMAL HIGH (ref 70–99)
Glucose-Capillary: 155 mg/dL — ABNORMAL HIGH (ref 70–99)
Glucose-Capillary: 177 mg/dL — ABNORMAL HIGH (ref 70–99)
Glucose-Capillary: 196 mg/dL — ABNORMAL HIGH (ref 70–99)
Glucose-Capillary: 216 mg/dL — ABNORMAL HIGH (ref 70–99)
Glucose-Capillary: 266 mg/dL — ABNORMAL HIGH (ref 70–99)
Glucose-Capillary: 304 mg/dL — ABNORMAL HIGH (ref 70–99)
Glucose-Capillary: 341 mg/dL — ABNORMAL HIGH (ref 70–99)

## 2023-02-18 LAB — COMPREHENSIVE METABOLIC PANEL
ALT: 97 U/L — ABNORMAL HIGH (ref 0–44)
AST: 89 U/L — ABNORMAL HIGH (ref 15–41)
Albumin: 2.8 g/dL — ABNORMAL LOW (ref 3.5–5.0)
Alkaline Phosphatase: 124 U/L (ref 38–126)
Anion gap: 10 (ref 5–15)
BUN: 49 mg/dL — ABNORMAL HIGH (ref 6–20)
CO2: 25 mmol/L (ref 22–32)
Calcium: 8.4 mg/dL — ABNORMAL LOW (ref 8.9–10.3)
Chloride: 95 mmol/L — ABNORMAL LOW (ref 98–111)
Creatinine, Ser: 4.86 mg/dL — ABNORMAL HIGH (ref 0.61–1.24)
GFR, Estimated: 13 mL/min — ABNORMAL LOW (ref >60)
Glucose, Bld: 341 mg/dL — ABNORMAL HIGH (ref 70–99)
Potassium: 6.2 mmol/L — ABNORMAL HIGH (ref 3.5–5.1)
Sodium: 130 mmol/L — ABNORMAL LOW (ref 135–145)
Total Bilirubin: 0.6 mg/dL (ref <1.2)
Total Protein: 5 g/dL — ABNORMAL LOW (ref 6.5–8.1)

## 2023-02-18 LAB — MAGNESIUM: Magnesium: 2.1 mg/dL (ref 1.7–2.4)

## 2023-02-18 LAB — LACTIC ACID, PLASMA
Lactic Acid, Venous: 5.3 mmol/L (ref 0.5–1.9)
Lactic Acid, Venous: 1 mmol/L (ref 0.5–1.9)
Lactic Acid, Venous: 1.5 mmol/L (ref 0.5–1.9)

## 2023-02-18 LAB — TROPONIN I (HIGH SENSITIVITY)
Troponin I (High Sensitivity): 382 ng/L (ref <18)
Troponin I (High Sensitivity): 532 ng/L (ref <18)
Troponin I (High Sensitivity): 460 ng/L (ref <18)
Troponin I (High Sensitivity): 483 ng/L (ref <18)

## 2023-02-18 LAB — POCT ACTIVATED CLOTTING TIME: Activated Clotting Time: 337 s

## 2023-02-18 LAB — BLOOD GAS, ARTERIAL
Acid-base deficit: 1.1 mmol/L (ref 0.0–2.0)
Bicarbonate: 25.3 mmol/L (ref 20.0–28.0)
FIO2: 100 %
MECHVT: 500 mL
Mechanical Rate: 20
O2 Saturation: 98.5 %
PEEP: 10 cmH2O
Patient temperature: 37
pCO2 arterial: 48 mm[Hg] (ref 32–48)
pH, Arterial: 7.33 — ABNORMAL LOW (ref 7.35–7.45)
pO2, Arterial: 180 mm[Hg] — ABNORMAL HIGH (ref 83–108)

## 2023-02-18 LAB — HEMOGLOBIN A1C
Hgb A1c MFr Bld: 6.7 % — ABNORMAL HIGH (ref 4.8–5.6)
Mean Plasma Glucose: 145.59 mg/dL

## 2023-02-18 LAB — HEPATITIS B SURFACE ANTIGEN: Hepatitis B Surface Ag: NONREACTIVE

## 2023-02-18 LAB — MRSA NEXT GEN BY PCR, NASAL: MRSA by PCR Next Gen: NOT DETECTED

## 2023-02-18 LAB — BETA-HYDROXYBUTYRIC ACID: Beta-Hydroxybutyric Acid: 0.39 mmol/L — ABNORMAL HIGH (ref 0.05–0.27)

## 2023-02-18 LAB — CBG MONITORING, ED: Glucose-Capillary: 224 mg/dL — ABNORMAL HIGH (ref 70–99)

## 2023-02-18 SURGERY — CORONARY/GRAFT ACUTE MI REVASCULARIZATION
Anesthesia: Moderate Sedation

## 2023-02-18 MED ORDER — HYDRALAZINE HCL 20 MG/ML IJ SOLN: 10.0000 mg | INTRAMUSCULAR | Status: DC | PRN

## 2023-02-18 MED ORDER — FENTANYL CITRATE (PF) 100 MCG/2ML IJ SOLN
INTRAMUSCULAR | Status: AC
  Filled 2023-02-18: qty 2

## 2023-02-18 MED ORDER — EPINEPHRINE HCL 5 MG/250ML IV SOLN IN NS
INTRAVENOUS | Status: AC | PRN
  Administered 2023-02-18: 5 ug/min via INTRAVENOUS

## 2023-02-18 MED ORDER — CHLORHEXIDINE GLUCONATE CLOTH 2 % EX PADS
6.0000 | MEDICATED_PAD | Freq: Every day | CUTANEOUS | Status: DC
  Administered 2023-02-19 – 2023-02-21 (×3): 6 via TOPICAL

## 2023-02-18 MED ORDER — SODIUM CHLORIDE 0.9 % IV SOLN
INTRAVENOUS | Status: AC | PRN
  Administered 2023-02-18: 20 mL/h via INTRAVENOUS

## 2023-02-18 MED ORDER — AMIODARONE HCL 150 MG/3ML IV SOLN
INTRAVENOUS | Status: DC | PRN
  Administered 2023-02-18: 150 mg via INTRAVENOUS

## 2023-02-18 MED ORDER — SODIUM CHLORIDE 0.9 % IV SOLN
INTRAVENOUS | Status: AC | PRN
  Administered 2023-02-18: 1.75 mg/kg/h via INTRAVENOUS

## 2023-02-18 MED ORDER — ALTEPLASE 2 MG IJ SOLR: 2.0000 mg | Freq: Once | INTRAMUSCULAR | Status: DC | PRN

## 2023-02-18 MED ORDER — AMIODARONE HCL IN DEXTROSE 360-4.14 MG/200ML-% IV SOLN
30.0000 mg/h | INTRAVENOUS | Status: DC
  Administered 2023-02-18 (×4): 30 mg/h via INTRAVENOUS
  Filled 2023-02-18 (×2): qty 200

## 2023-02-18 MED ORDER — INSULIN DETEMIR 100 UNIT/ML ~~LOC~~ SOLN
5.0000 [IU] | Freq: Two times a day (BID) | SUBCUTANEOUS | Status: DC
Start: 2023-02-18 — End: 2023-02-19
  Administered 2023-02-18: 5 [IU] via SUBCUTANEOUS
  Filled 2023-02-18: qty 0.05

## 2023-02-18 MED ORDER — ASPIRIN 81 MG PO CHEW: 81.0000 mg | CHEWABLE_TABLET | Freq: Every day | ORAL | Status: DC

## 2023-02-18 MED ORDER — LEVETIRACETAM IN NACL 1500 MG/100ML IV SOLN: 1500.0000 mg | Freq: Two times a day (BID) | INTRAVENOUS | Status: DC

## 2023-02-18 MED ORDER — BIVALIRUDIN TRIFLUOROACETATE 250 MG IV SOLR
INTRAVENOUS | Status: AC
Start: 2023-02-18 — End: ?
  Filled 2023-02-18: qty 250

## 2023-02-18 MED ORDER — HEPARIN (PORCINE) 25000 UT/250ML-% IV SOLN
INTRAVENOUS | Status: AC
  Filled 2023-02-18: qty 250

## 2023-02-18 MED ORDER — PANTOPRAZOLE SODIUM 40 MG IV SOLR
40.0000 mg | Freq: Every day | INTRAVENOUS | Status: DC
  Administered 2023-02-18 – 2023-02-21 (×4): 40 mg via INTRAVENOUS
  Filled 2023-02-18 (×4): qty 10

## 2023-02-18 MED ORDER — MIDAZOLAM HCL 2 MG/2ML IJ SOLN
INTRAMUSCULAR | Status: AC
  Filled 2023-02-18: qty 2

## 2023-02-18 MED ORDER — MIDAZOLAM HCL 2 MG/2ML IJ SOLN
INTRAMUSCULAR | Status: AC
Start: 2023-02-18 — End: ?
  Filled 2023-02-18: qty 2

## 2023-02-18 MED ORDER — MIDAZOLAM HCL 2 MG/2ML IJ SOLN
INTRAMUSCULAR | Status: DC | PRN
  Administered 2023-02-18 (×4): .5 mg via INTRAVENOUS

## 2023-02-18 MED ORDER — SODIUM CHLORIDE 0.9 % IV SOLN: 250.0000 mL | INTRAVENOUS | Status: AC | PRN

## 2023-02-18 MED ORDER — HEPARIN (PORCINE) IN NACL 2000-0.9 UNIT/L-% IV SOLN
INTRAVENOUS | Status: DC | PRN
  Administered 2023-02-18: 1000 mL

## 2023-02-18 MED ORDER — ORAL CARE MOUTH RINSE: 15.0000 mL | OROMUCOSAL | Status: DC | PRN

## 2023-02-18 MED ORDER — INSULIN ASPART 100 UNIT/ML IJ SOLN: 0.0000 [IU] | INTRAMUSCULAR | Status: DC

## 2023-02-18 MED ORDER — INSULIN ASPART 100 UNIT/ML IV SOLN
10.0000 [IU] | Freq: Once | INTRAVENOUS | Status: AC
  Administered 2023-02-18: 10 [IU] via INTRAVENOUS
  Filled 2023-02-18: qty 0.1

## 2023-02-18 MED ORDER — INSULIN ASPART 100 UNIT/ML IJ SOLN: 1.0000 [IU] | INTRAMUSCULAR | Status: DC

## 2023-02-18 MED ORDER — LABETALOL HCL 5 MG/ML IV SOLN: 10.0000 mg | INTRAVENOUS | Status: DC | PRN

## 2023-02-18 MED ORDER — CLOPIDOGREL BISULFATE 75 MG PO TABS: 75.0000 mg | ORAL_TABLET | Freq: Every day | ORAL | Status: DC

## 2023-02-18 MED ORDER — SODIUM CHLORIDE 0.9 % IV SOLN
1.0000 g | INTRAVENOUS | Status: DC
  Administered 2023-02-18: 1 g via INTRAVENOUS
  Filled 2023-02-18 (×2): qty 10

## 2023-02-18 MED ORDER — MIDAZOLAM-SODIUM CHLORIDE 100-0.9 MG/100ML-% IV SOLN
0.0000 mg/h | INTRAVENOUS | Status: DC
  Administered 2023-02-18: 10 mg/h via INTRAVENOUS
  Administered 2023-02-18: 2 mg/h via INTRAVENOUS
  Administered 2023-02-19 – 2023-02-20 (×3): 10 mg/h via INTRAVENOUS
  Administered 2023-02-21: 2 mg/h via INTRAVENOUS
  Filled 2023-02-18 (×5): qty 100

## 2023-02-18 MED ORDER — FENTANYL 2500MCG IN NS 250ML (10MCG/ML) PREMIX INFUSION
50.0000 ug/h | INTRAVENOUS | Status: DC
  Administered 2023-02-18 (×2): 50 ug/h via INTRAVENOUS
  Administered 2023-02-19 – 2023-02-20 (×2): 100 ug/h via INTRAVENOUS
  Filled 2023-02-18 (×3): qty 250

## 2023-02-18 MED ORDER — ACETAMINOPHEN 325 MG PO TABS: 650.0000 mg | ORAL_TABLET | ORAL | Status: DC | PRN

## 2023-02-18 MED ORDER — ONDANSETRON HCL 4 MG/2ML IJ SOLN: 4.0000 mg | Freq: Four times a day (QID) | INTRAMUSCULAR | Status: DC | PRN

## 2023-02-18 MED ORDER — SODIUM CHLORIDE 0.9% FLUSH: 3.0000 mL | INTRAVENOUS | Status: DC | PRN

## 2023-02-18 MED ORDER — INSULIN ASPART 100 UNIT/ML IJ SOLN
10.0000 [IU] | Freq: Once | INTRAMUSCULAR | Status: DC
  Filled 2023-02-18: qty 0.1

## 2023-02-18 MED ORDER — NOREPINEPHRINE BITARTRATE 1 MG/ML IV SOLN
INTRAVENOUS | Status: AC | PRN
  Administered 2023-02-18: 15 ug/min via INTRAVENOUS
  Administered 2023-02-18: 2 ug/min

## 2023-02-18 MED ORDER — BIVALIRUDIN BOLUS VIA INFUSION - CUPID
INTRAVENOUS | Status: DC | PRN
  Administered 2023-02-18: 68.025 mg via INTRAVENOUS

## 2023-02-18 MED ORDER — MIDAZOLAM HCL 2 MG/2ML IJ SOLN: 2.0000 mg | Freq: Once | INTRAMUSCULAR | Status: AC

## 2023-02-18 MED ORDER — ACETAMINOPHEN 325 MG PO TABS
650.0000 mg | ORAL_TABLET | ORAL | Status: DC | PRN
  Administered 2023-02-20 – 2023-02-21 (×3): 650 mg
  Filled 2023-02-18 (×2): qty 2

## 2023-02-18 MED ORDER — CANGRELOR TETRASODIUM 50 MG IV SOLR
INTRAVENOUS | Status: AC
  Filled 2023-02-18: qty 50

## 2023-02-18 MED ORDER — LEVETIRACETAM IN NACL 500 MG/100ML IV SOLN
500.0000 mg | INTRAVENOUS | Status: DC | PRN
  Administered 2023-02-20: 500 mg via INTRAVENOUS

## 2023-02-18 MED ORDER — CHLORHEXIDINE GLUCONATE CLOTH 2 % EX PADS
6.0000 | MEDICATED_PAD | Freq: Every day | CUTANEOUS | Status: DC
  Administered 2023-02-18: 6 via TOPICAL

## 2023-02-18 MED ORDER — SODIUM CHLORIDE 0.9 % WEIGHT BASED INFUSION
1.0000 mL/kg/h | INTRAVENOUS | Status: DC
  Administered 2023-02-18: 1 mL/kg/h via INTRAVENOUS

## 2023-02-18 MED ORDER — MIDAZOLAM-SODIUM CHLORIDE 100-0.9 MG/100ML-% IV SOLN
INTRAVENOUS | Status: AC
  Filled 2023-02-18: qty 100

## 2023-02-18 MED ORDER — POLYETHYLENE GLYCOL 3350 17 G PO PACK
17.0000 g | PACK | Freq: Every day | ORAL | Status: DC
  Administered 2023-02-19 – 2023-02-21 (×3): 17 g
  Filled 2023-02-18 (×3): qty 1

## 2023-02-18 MED ORDER — AMIODARONE HCL IN DEXTROSE 360-4.14 MG/200ML-% IV SOLN: 60.0000 mg/h | INTRAVENOUS | Status: DC

## 2023-02-18 MED ORDER — EPINEPHRINE HCL 5 MG/250ML IV SOLN IN NS
INTRAVENOUS | Status: AC
  Filled 2023-02-18: qty 250

## 2023-02-18 MED ORDER — MIDAZOLAM BOLUS VIA INFUSION
0.0000 mg | INTRAVENOUS | Status: DC | PRN
  Administered 2023-02-18: 2 mg via INTRAVENOUS
  Administered 2023-02-18: 5 mg via INTRAVENOUS
  Administered 2023-02-18: 2 mg via INTRAVENOUS

## 2023-02-18 MED ORDER — NOREPINEPHRINE 16 MG/250ML-% IV SOLN
0.0000 ug/min | INTRAVENOUS | Status: DC
  Administered 2023-02-18: 2 ug/min via INTRAVENOUS
  Administered 2023-02-20: 6 ug/min via INTRAVENOUS
  Filled 2023-02-18 (×3): qty 250

## 2023-02-18 MED ORDER — SODIUM CHLORIDE 0.9 % IV SOLN
INTRAVENOUS | Status: AC | PRN
  Administered 2023-02-18: 4 ug/kg/min via INTRAVENOUS

## 2023-02-18 MED ORDER — LEVETIRACETAM IN NACL 1000 MG/100ML IV SOLN
1000.0000 mg | INTRAVENOUS | Status: DC
  Administered 2023-02-19 – 2023-02-21 (×3): 1000 mg via INTRAVENOUS
  Filled 2023-02-18 (×3): qty 100

## 2023-02-18 MED ORDER — DOCUSATE SODIUM 50 MG/5ML PO LIQD
100.0000 mg | Freq: Two times a day (BID) | ORAL | Status: DC
  Administered 2023-02-18 – 2023-02-21 (×6): 100 mg
  Filled 2023-02-18 (×6): qty 10

## 2023-02-18 MED ORDER — PROPOFOL 1000 MG/100ML IV EMUL
5.0000 ug/kg/min | INTRAVENOUS | Status: DC
  Administered 2023-02-18: 5 ug/kg/min via INTRAVENOUS
  Administered 2023-02-18: 15 ug/kg/min via INTRAVENOUS
  Administered 2023-02-19 – 2023-02-20 (×5): 25 ug/kg/min via INTRAVENOUS
  Administered 2023-02-20: 20 ug/kg/min via INTRAVENOUS
  Filled 2023-02-18 (×7): qty 100

## 2023-02-18 MED ORDER — SODIUM ZIRCONIUM CYCLOSILICATE 5 G PO PACK
10.0000 g | PACK | Freq: Two times a day (BID) | ORAL | Status: DC
  Administered 2023-02-18: 10 g
  Filled 2023-02-18: qty 2

## 2023-02-18 MED ORDER — AMIODARONE LOAD VIA INFUSION
150.0000 mg | Freq: Once | INTRAVENOUS | Status: DC
  Filled 2023-02-18: qty 83.34

## 2023-02-18 MED ORDER — INSULIN REGULAR(HUMAN) IN NACL 100-0.9 UT/100ML-% IV SOLN
INTRAVENOUS | Status: DC
  Administered 2023-02-18: 9 [IU]/h via INTRAVENOUS
  Filled 2023-02-18: qty 100

## 2023-02-18 MED ORDER — TICAGRELOR 90 MG PO TABS
180.0000 mg | ORAL_TABLET | Freq: Once | ORAL | Status: AC
  Administered 2023-02-18: 180 mg
  Filled 2023-02-18: qty 2

## 2023-02-18 MED ORDER — ASPIRIN 81 MG PO CHEW
81.0000 mg | CHEWABLE_TABLET | Freq: Every day | ORAL | Status: DC
Start: 2023-02-19 — End: 2023-02-20
  Administered 2023-02-19 – 2023-02-20 (×2): 81 mg via ORAL
  Filled 2023-02-18 (×2): qty 1

## 2023-02-18 MED ORDER — SODIUM CHLORIDE 0.9 % IV SOLN
4.0000 ug/kg/min | INTRAVENOUS | Status: DC
  Administered 2023-02-18 (×2): 4 ug/kg/min via INTRAVENOUS
  Filled 2023-02-18: qty 50

## 2023-02-18 MED ORDER — SODIUM CHLORIDE 0.9% FLUSH
3.0000 mL | Freq: Two times a day (BID) | INTRAVENOUS | Status: DC
  Administered 2023-02-18 – 2023-02-21 (×6): 3 mL via INTRAVENOUS

## 2023-02-18 MED ORDER — TICAGRELOR 90 MG PO TABS
90.0000 mg | ORAL_TABLET | Freq: Two times a day (BID) | ORAL | Status: DC
  Administered 2023-02-18 – 2023-02-21 (×6): 90 mg
  Filled 2023-02-18 (×6): qty 1

## 2023-02-18 MED ORDER — BIVALIRUDIN TRIFLUOROACETATE 250 MG IV SOLR
INTRAVENOUS | Status: AC
  Filled 2023-02-18: qty 250

## 2023-02-18 MED ORDER — HEPARIN SODIUM (PORCINE) 5000 UNIT/ML IJ SOLN: 5000.0000 [IU] | Freq: Three times a day (TID) | INTRAMUSCULAR | Status: DC

## 2023-02-18 MED ORDER — SODIUM CHLORIDE 0.9% FLUSH
3.0000 mL | Freq: Two times a day (BID) | INTRAVENOUS | Status: DC
  Administered 2023-02-18 – 2023-02-20 (×4): 3 mL via INTRAVENOUS

## 2023-02-18 MED ORDER — SODIUM CHLORIDE 0.9 % IV SOLN
4.0000 ug/kg/min | INTRAVENOUS | Status: DC
  Filled 2023-02-18: qty 50

## 2023-02-18 MED ORDER — FENTANYL BOLUS VIA INFUSION
50.0000 ug | INTRAVENOUS | Status: DC | PRN
  Administered 2023-02-18 (×2): 100 ug via INTRAVENOUS

## 2023-02-18 MED ORDER — HEPARIN (PORCINE) IN NACL 1000-0.9 UT/500ML-% IV SOLN
INTRAVENOUS | Status: DC | PRN
  Administered 2023-02-18: 500 mL

## 2023-02-18 MED ORDER — CANGRELOR BOLUS VIA INFUSION
INTRAVENOUS | Status: DC | PRN
  Administered 2023-02-18: 2721 ug via INTRAVENOUS

## 2023-02-18 MED ORDER — ORAL CARE MOUTH RINSE
15.0000 mL | OROMUCOSAL | Status: DC
  Administered 2023-02-18 – 2023-02-21 (×36): 15 mL via OROMUCOSAL

## 2023-02-18 MED ORDER — DEXTROSE 50 % IV SOLN: 0.0000 mL | INTRAVENOUS | Status: DC | PRN

## 2023-02-18 MED ORDER — HEPARIN SODIUM (PORCINE) 1000 UNIT/ML DIALYSIS: 1000.0000 [IU] | INTRAMUSCULAR | Status: DC | PRN

## 2023-02-18 MED ORDER — LEVETIRACETAM 500 MG/5ML IV SOLN
2000.0000 mg | Freq: Once | INTRAVENOUS | Status: AC
  Administered 2023-02-18: 2000 mg via INTRAVENOUS
  Filled 2023-02-18: qty 20

## 2023-02-18 MED ORDER — IOHEXOL 300 MG/ML  SOLN
INTRAMUSCULAR | Status: DC | PRN
  Administered 2023-02-18: 555 mL

## 2023-02-18 MED ORDER — SODIUM CHLORIDE 0.9 % WEIGHT BASED INFUSION: 1.0000 mL/kg/h | INTRAVENOUS | Status: DC

## 2023-02-18 MED ORDER — AMIODARONE HCL IN DEXTROSE 360-4.14 MG/200ML-% IV SOLN
INTRAVENOUS | Status: AC
  Filled 2023-02-18: qty 200

## 2023-02-18 SURGICAL SUPPLY — 30 items
BALLN SCOREFLEX 2.50X10 (BALLOONS) ×1
BALLN TREK RX 2.25X12 (BALLOONS) ×1
BALLN ~~LOC~~ TREK NEO RX 2.5X8 (BALLOONS) ×1
BALLN ~~LOC~~ TREK NEO RX 3.5X8 (BALLOONS) ×1
BALLOON SCOREFLEX 2.50X10 (BALLOONS) IMPLANT
BALLOON TAKERU 1.5X12 (BALLOONS) IMPLANT
BALLOON TREK RX 2.25X12 (BALLOONS) IMPLANT
BALLOON ~~LOC~~ TREK NEO RX 2.5X8 (BALLOONS) IMPLANT
BALLOON ~~LOC~~ TREK NEO RX 3.5X8 (BALLOONS) IMPLANT
CATH INFINITI 5FR MULTPACK ANG (CATHETERS) IMPLANT
CATH VISTA GUIDE 6FR JL4 (CATHETERS) IMPLANT
CATH VISTA GUIDE 6FR XB3.5 (CATHETERS) IMPLANT
DEVICE CLOSURE MYNXGRIP 6/7F (Vascular Products) IMPLANT
DRAPE BRACHIAL (DRAPES) IMPLANT
GLIDESHEATH SLEND SS 6F .021 (SHEATH) IMPLANT
GUIDEWIRE INQWIRE 1.5J.035X260 (WIRE) IMPLANT
INQWIRE 1.5J .035X260CM (WIRE) ×1
KIT ENCORE 26 ADVANTAGE (KITS) IMPLANT
PACK CARDIAC CATH (CUSTOM PROCEDURE TRAY) ×1 IMPLANT
PROTECTION STATION PRESSURIZED (MISCELLANEOUS) ×1
SET ATX-X65L (MISCELLANEOUS) IMPLANT
SHEATH AVANTI 6FR X 11CM (SHEATH) IMPLANT
SHEATH AVANTI 7FRX11 (SHEATH) IMPLANT
STATION PROTECTION PRESSURIZED (MISCELLANEOUS) IMPLANT
STENT ONYX FRONTIER 3.0X08 (Permanent Stent) IMPLANT
TUBING CIL FLEX 10 FLL-RA (TUBING) IMPLANT
WIRE ASAHI GRAND SLAM 180CM (WIRE) IMPLANT
WIRE ASAHI PROWATER 180CM (WIRE) IMPLANT
WIRE G HI TQ BMW 190 (WIRE) IMPLANT
WIRE GUIDERIGHT .035X150 (WIRE) IMPLANT

## 2023-02-18 NOTE — Code Documentation (Signed)
1610 bicarb adm

## 2023-02-18 NOTE — ED Triage Notes (Signed)
Pt arrived via [facility] from home CPR in progress. 13 min CPR prior to arrival. Pt originally called out for chest pain, upon ems arrival pt was sitting on edge of bed grey but holding own posture. EMS lost pulse at truck. Pt took ASA and x1 SL nitroglycerin before ems arrival.

## 2023-02-18 NOTE — IPAL (Signed)
  Interdisciplinary Goals of Care Family Meeting   Date carried out: 02/12/2023  Location of the meeting: Unit  Member's involved: Physician and Family Member or next of kin  Durable Power of Attorney or acting medical decision maker: Daughter Gracie    Discussion: We discussed goals of care for James Moreno .  With cardiac arrest, STEMI, Cardiogenic shock and signs of anoxic brain injury. Prognosis is guarded. Howere they still would like to continue as full code.   Code status:   Code Status: Full Code   Disposition: Continue current acute care  Time spent for the meeting: 15 minutes    Janann Colonel, MD  02/20/2023, 3:11 PM

## 2023-02-18 NOTE — Code Documentation (Signed)
0641 pulses lost cpr resumed begin compressions

## 2023-02-18 NOTE — Code Documentation (Signed)
4098 1mg  atropine adm

## 2023-02-18 NOTE — Code Documentation (Signed)
Pt arrival CPR in progress

## 2023-02-18 NOTE — Consult Note (Addendum)
NEURO HOSPITALIST CONSULT NOTE   Requestig physician: Dr. Darrold Junker  Reason for Consult: Anoxic brain injury  History obtained from: Family and Chart     HPI:                                                                                                                                          James Moreno is an 57 y.o. male with a PMHx of ESRD s/p renal transplant on HD (last dialysis session was on Saturday), DM2, hepatitis B, HTN and hypothyroidism who presented to the ED via EMS this morning in cardiac arrest. EMS had been called out for shortness of breath. On their arrival, he was confused and gray in appearance, with O2 sats in the 70s on RA. EKG in the ambulance revealed an anterior lateral ST elevation MI and code STEMI was called en route. He then became bradycardic in the 30s followed by loss of pulses and asystole. CPR was started at 6:09 AM which was continued by EMS for 15 minutes, during which he received 2 rounds of epinephrine. He was being ventilated by BVM on arrival to the ED and was receiving active chest compressions. CPR was continued in the ED and ROSC was obtained after a total arrest time of approximately 28 minutes. He underwent cardiac catheterization for his MI and was then brought to the ICU, where myoclonus and rhythmic fasciculations of the face were noted. He was loaded with Keppra 2000 mg and STAT EEG was ordered.   EEG reveals myoclonic seizures occurring every few seconds. CT head is pending.    Past Medical History:  Diagnosis Date   Acute kidney failure, unspecified (HCC)    Anxiety    Diabetes mellitus, type 2 (HCC)    GERD (gastroesophageal reflux disease)    History of arterial disease of lower extremity    History of hepatitis B    Hypertension    Hypothyroidism    Mitral valve disorders(424.0)    Pityriasis 12/27/2014   Primary pulmonary HTN (HCC)    Pt denies ever having.   Tricuspid valve disorders, specified as  nonrheumatic     Past Surgical History:  Procedure Laterality Date   APPENDECTOMY     Carotid Doppler Ultrasound  06/14/2009   39% stenosis of bilateral internal carotid artery, bilateral anterograde vertebral flow   DIALYSIS/PERMA CATHETER INSERTION N/A 02/04/2023   Procedure: DIALYSIS/PERMA CATHETER INSERTION;  Surgeon: Annice Needy, MD;  Location: [facility];  Service: Cardiovascular;  Laterality: N/A;   ESOPHAGOGASTRODUODENOSCOPY (EGD) WITH PROPOFOL N/A 01/21/2019   Procedure: ESOPHAGOGASTRODUODENOSCOPY (EGD) WITH PROPOFOL;  Surgeon: Toney Reil, MD;  Location: [facility];  Service: Endoscopy;  Laterality: N/A;  Diabetic - insulin   EYE SURGERY     right  HEMORROIDECTOMY     KIDNEY TRANSPLANT Right 2016   MECKEL DIVERTICULUM EXCISION     infancy   Myocardial Perfusion scan  01/17/2009   [facility], non- ischemic. LVEF= 55%   PARS PLANA VITRECTOMY Left 02/09/2015   Procedure: Pan retinal photocoagulation 78295;  Surgeon: Marcelene Butte, MD;  Location: [facility];  Service: Ophthalmology;  Laterality: Left;   REFRACTIVE SURGERY Left    sleep study  01/09/2011   Severe sleep apnea. AHI 72.9/hr. RDI=83.0/hr. Desaturation to 69.0% Emergency CPAP titaration to 14.0cm (01/14/19 resolved after wt loss after kidney transplant.)    Family History  Problem Relation Age of Onset   Hypertension Mother    Hyperlipidemia Mother    Melanoma Father            Social History:  reports that he has been smoking cigarettes. He has a 41 pack-year smoking history. He has never used smokeless tobacco. He reports that he does not currently use alcohol. He reports that he does not use drugs.  Allergies  Allergen Reactions   No Known Allergies     MEDICATIONS:                                                                                                                     Prior to Admission:  Medications Prior to Admission  Medication Sig Dispense Refill Last  Dose   alprazolam (XANAX) 2 MG tablet TAKE 1 TABLET BY MOUTH THREE TIMES DAILY AS NEEDED FOR ANXIETY 90 tablet 2    amLODipine (NORVASC) 10 MG tablet Take 10 mg by mouth daily.       atorvastatin (LIPITOR) 80 MG tablet Take 1 tablet (80 mg total) by mouth daily. 90 tablet 1    BD INSULIN SYRINGE U/F 31G X 5/16" 1 ML MISC  (Patient not taking: Reported on 01/21/2023)      calcitRIOL (ROCALTROL) 0.25 MCG capsule Take 1 capsule (0.25 mcg total) by mouth daily. 10 capsule 0    carvedilol (COREG) 12.5 MG tablet Take 1.5 tablets (18.75 mg total) by mouth 2 (two) times daily with a meal. 270 tablet 3    chlorhexidine (PERIDEX) 0.12 % solution SMARTSIG:0.5 Capful(s) By Mouth Twice Daily (Patient not taking: Reported on 02/04/2023)      cinacalcet (SENSIPAR) 30 MG tablet Take 30 mg by mouth daily.      [name] 10 MG TABS tablet TAKE 1 TABLET BY MOUTH BEFORE BREAKFAST 30 tablet 0    insulin aspart (NOVOLOG) 100 UNIT/ML injection Inject 8 Units into the skin 3 (three) times daily before meals. and reports adjusting per sliding scale from Endocrinologist if blood sugar greater than 120 prior to mea      insulin degludec (TRESIBA FLEXTOUCH) 100 UNIT/ML FlexTouch Pen Inject 39 Units into the skin daily. Patient receives via [name] Patient Assistance through Dec 2023 (Patient taking differently: Inject 18 Units into the skin daily. Patient receives via [name] Patient Assistance through Dec 2023)      losartan (COZAAR)  100 MG tablet Take 1 tablet (100 mg total) by mouth at bedtime. 10 tablet 0    mycophenolate (CELLCEPT) 500 MG tablet Take 1,000 mg by mouth 2 (two) times daily.      naloxone (NARCAN) nasal spray 4 mg/0.1 mL Place 1 spray into the nose once.      omeprazole (PRILOSEC) 20 MG capsule Take 20 mg by mouth daily as needed.      oxyCODONE (OXY IR/ROXICODONE) 5 MG immediate release tablet Take 1-2 tablets (5-10 mg total) by mouth every 6 (six) hours as needed for severe pain (pain score 7-10).  240 tablet 0    Semaglutide,0.25 or 0.5MG /DOS, (OZEMPIC, 0.25 OR 0.5 MG/DOSE,) 2 MG/3ML SOPN Inject 0.5 mg into the skin once a week. Patient receives via [name] Patient Assistance (Patient taking differently: Inject 1 mg into the skin once a week. Patient receives via [name] Patient Assistance)      sildenafil (REVATIO) 20 MG tablet  (Patient not taking: Reported on 01/21/2023)      sildenafil (VIAGRA) 100 MG tablet Take 1 tab 1 hour prior to intercourse 90 tablet 0    tacrolimus (PROGRAF) 1 MG capsule Take 3 mg by mouth 2 (two) times daily.   11    tamsulosin (FLOMAX) 0.4 MG CAPS capsule Take 1 capsule (0.4 mg total) by mouth daily. 90 capsule 1    tretinoin (RETIN-A) 0.05 % cream Apply topically as needed.       triamcinolone cream (KENALOG) 0.1 % APPLY DAILY TO INFLAMED BUMPS AS NEEDED      XYOSTED 50 MG/0.5ML SOAJ  (Patient not taking: Reported on 01/21/2023)      Scheduled:  amiodarone  150 mg Intravenous Once   [START ON 02/19/2023] aspirin  81 mg Oral Daily   Chlorhexidine Gluconate Cloth  6 each Topical Daily   [START ON 02/19/2023] Chlorhexidine Gluconate Cloth  6 each Topical Q0600   docusate  100 mg Per Tube BID   insulin aspart  10 Units Intravenous Once   pantoprazole (PROTONIX) IV  40 mg Intravenous Daily   polyethylene glycol  17 g Per Tube Daily   sodium chloride flush  3 mL Intravenous Q12H   sodium chloride flush  3 mL Intravenous Q12H   sodium zirconium cyclosilicate  10 g Per Tube BID   ticagrelor  90 mg Per Tube BID   Continuous:  sodium chloride     sodium chloride     sodium chloride     sodium chloride 1 mL/kg/hr (02/16/2023 1335)   amiodarone 30 mg/hr (02/13/2023 1321)   cefTRIAXone (ROCEPHIN)  IV 1 g (02/15/2023 1349)   fentaNYL infusion INTRAVENOUS 50 mcg/hr (01/25/2023 1049)   heparin     insulin     [START ON 02/19/2023] levETIRAcetam     midazolam 2 mg/hr (02/05/2023 1027)   norepinephrine (LEVOPHED) Adult infusion 2 mcg/min (02/09/2023 1404)    propofol (DIPRIVAN) infusion 5 mcg/kg/min (02/23/2023 1325)     ROS:  Unable to obtain due to unresponsiveness.    Blood pressure (!) 104/39, pulse 87, resp. rate 16, height 5' 10.98" (1.803 m), SpO2 96%.   General Examination:                                                                                                       Physical Exam HEENT- [place]/AT. No meningismus.    Lungs- Intubated Extremities- Noncyanotic  Neurological Examination Mental Status: Intubated and sedated on propofol at a rate of 15, Versed at a rate of 2 and fentanyl at a rate of 50. No eye opening spontaneously or to stimulation. Not responding to voice. No attempts to communicate. No limb withdrawal to noxious stimuli. No posturing noted. Intermittent low amplitude abrupt internal rotation of the legs occurs in synchrony with intermittent facial twitching.  Cranial Nerves: II: No blink to threat. Left pupil 3 mm and unreactive. Right pupil ovoid, approximately 3 x 5 mm and unreactive. III,IV, VI: Eyes will remain open after passive opening by examiner. Eyes are midline and conjugate. Absent oculocephalic reflex. No nystagmus.  V: Absent corneal reflexes bilaterally . VII: Flaccidly symmetric VIII: No response to voice IX,X: Intubated XI: Head is midline XII: Unable to assess Motor/Sensory: BUE: Flaccid tone. No posturing, withdrawal or other movement to noxious.  BLE: Flaccid tone. No posturing, withdrawal or other movement to noxious.  Intermittent low-amplitude myoclonic jerking of the BLE is noted (synchronous and symmetric internal rotation of the legs).  Deep Tendon Reflexes: Hypoactive x 4 (sedated) Cerebellar: Unable to assess Gait: Unable to assess    Lab Results: Basic Metabolic Panel: Recent Labs  Lab 02/23/2023 0645 02/20/2023 0810  NA 128* 130*  K 3.4* 5.1  CL  93*  --   CO2 23  --   GLUCOSE 621*  --   BUN 39*  --   CREATININE 4.36*  --   CALCIUM 9.7  --   MG 2.1  --     CBC: Recent Labs  Lab 02/22/2023 0645 02/23/2023 0810  WBC 11.6*  --   HGB 9.3* 10.9*  HCT 31.1* 32.0*  MCV 101.3*  --   PLT 181  --     Cardiac Enzymes: No results for input(s): "CKTOTAL", "CKMB", "CKMBINDEX", "TROPONINI" in the last 168 hours.  Lipid Panel: No results for input(s): "CHOL", "TRIG", "HDL", "CHOLHDL", "VLDL", "LDLCALC" in the last 168 hours.  Imaging: CARDIAC CATHETERIZATION  Result Date: 02/02/2023   2nd Mrg lesion is 99% stenosed.   1st Mrg lesion is 75% stenosed.   Mid Cx to Dist Cx lesion is 90% stenosed.   Ost LAD to Prox LAD lesion is 99% stenosed.   Mid LAD lesion is 50% stenosed.   Prox RCA lesion is 30% stenosed.   Mid RCA lesion is 20% stenosed.   A drug-eluting stent was successfully placed using a STENT ONYX FRONTIER 3.0X08.   Post intervention, there is a 0% residual stenosis.   There is severe left ventricular systolic dysfunction.   LV end diastolic pressure is mildly elevated.   The left ventricular ejection fraction  is 25-35% by visual estimate. 1.  Cardiac arrest 2.  Anterior STEMI 3.  Successful primary PCI with 3.0 x 8 mm Onyx frontier DES ostial LAD 4.  Residual 99% stenosis OM 2 which appears chronic and collateralized 5.  Severe dilated cardiomyopathy with estimated LV ejection fraction 25% 6.  Guarded prognosis Recommendations 1.  Continue IV cangrelor until patient can take oral platelet inhibitor 2.  Good medical management after extubation     Assessment: 56 year old male presenting in cardiac arrest. Total ACLS time is estimated to be 28 minutes. Myoclonus and rhythmic facial fasciculations noted by ICU team. Anoxic brain injury suspected. He has been loaded with 2000 mg Keppra IV.  - Exam under sedation reveals an unresponsive patient with absent pupillary light reflex, absent oculocephalic reflex, absent corneals, no cough/gag  reflex and no movement of the extremities to any stimuli. Intermittent low-amplitude myoclonic jerking of the face and lower legs is noted.  - CT head is pending.  - EEG: Patient was noted to have myoclonic seizures every few seconds. Additionally there was evidence of severe to profound diffuse encephalopathy. In the setting of cardiac arrest, this EEG pattern is concerning for anoxic/hypoxic brain injury. - Labs: - Elevated transaminases: AST 89, ALT 97.  - Na 130, Mg 2.1, ionized Ca 1.34 - Due to the patient having ESRD, will need to lower his Keppra dosing regiment to 1000 mg every day with supplemental 500 mg IV dose after dialysis sessions.  - Significant comorbidities:  - ST elevation MI, s/p cath - ESRD on HD - Overall impression:  - Probable anoxic brain injury secondary to cardiac arrest - Myoclonus, most likely secondary to anoxic brain injury - Assessment of prognosis will need to be made >/= 72 hours after cardiac arrest, off all sedation   Recommendations: - Due to the patient having ESRD, will need to lower his Keppra dosing regimen to 1000 mg every day with supplemental 500 mg IV dose after dialysis sessions (ordered) - Repeat EEG tomorrow morning - CT head as soon as he is stable enough to be scanned - MRI brain on Thursday morning at 7:00 AM (72 hours after cardiopulmonary resuscitation) - Increasing Versed gtt to a rate of 10 mg/hr - Continue propofol at a rate of 15 - May continue to titrate Versed and/or propofol towards the goal of eliminating all clinical manifestations of myoclonus.  - Obtain ammonia level and track LFTs in case addition of valproic acid to his regimen is needed.  - Supportive care and electrolyte management per CCM  40 minutes spent in the neurological evaluation and management of this critically ill patient  Addendum: - CT head: Limited study due to presence of contrast from cardiac catheterization performed earlier today. Hypodense focus in  the left side of the pons and left thalamus, concerning for infarct versus artifact. Correlation with MRI of the brain recommended.  - Obtain MRI without contrast now. This will also need to be repeated in 72 hours.     Electronically signed: Dr. Caryl Pina 02/16/2023, 10:34 AM

## 2023-02-18 NOTE — Code Documentation (Signed)
8469 intubation airway 7.5 and 23 at the teeth positive color change at 0626

## 2023-02-18 NOTE — Progress Notes (Signed)
Versed gtt increased from 2mg  to 10mg  per MD Lindzen verbal order.

## 2023-02-18 NOTE — Progress Notes (Signed)
[facility], [place] 02/14/2023  Subjective:   LOS: 0  Patient known to our practice from outpatient follow-up for CKD.  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  Patient arrived to emergency room via EMS in cardiac arrest.  He had called out for shortness of breath, confusion and pallor. EMS notes indicate low oxygen saturation upon their arrival.  His last dialysis was Saturday.  EKG obtained by EMS showed anterior lateral STEMI.  He lost pulse and became asystolic.  Resuscitative measures were provided for about 15 minutes.  Patient was intubated in the emergency room. Nephrology consult has been requested for evaluation as patient's potassium level was elevated at 6.2 this morning. When seen, patient had myoclonic jerks concerning for anoxic brain injury.  He was evaluated by neurologist.  He was able to come off pressors-epinephrine and Levophed.  He was sedated with Versed and fentanyl.  He was given Keppra for seizures.  Objective:  Vital signs in last 24 hours:  Pulse Rate:  [0-126] 69 (11/25 0956) Resp:  [0-112] 31 (11/25 0956) BP: (71-170)/(39-121) 135/80 (11/25 0956) SpO2:  [20 %-100 %] 99 % (11/25 1438) FiO2 (%):  [50 %-100 %] 50 % (11/25 1438) Weight:  [99.3 kg] 99.3 kg (11/25 1300)  Weight change:  Filed Weights   02/14/2023 1300  Weight: 99.3 kg    Intake/Output:    Intake/Output Summary (Last 24 hours) at 02/01/2023 1546 Last data filed at 01/30/2023 1413 Gross per 24 hour  Intake 150 ml  Output 25 ml  Net 125 ml     Physical Exam: General: Critically ill-appearing  HEENT ET tube in place  Pulm/lungs Ventilator assisted.  FiO2 60%, PEEP 10  CVS/Heart Tachycardic, irregular  Abdomen:  Soft, nontender,  nondistended  Extremities: Trace edema  Neurologic: Myoclonic activity noted over face and forehead  Skin: Warm  Access: Right IJ tunneled dialysis catheter       Basic Metabolic Panel:  Recent Labs  Lab 02/11/2023 0645 02/09/2023 0810 02/17/2023 1152 01/29/2023 1442  NA 128* 130* 130* 131*  K 3.4* 5.1 6.2* 5.4*  CL 93*  --  95* 99  CO2 23  --  25 24  GLUCOSE 621*  --  341* 300*  BUN 39*  --  49* 52*  CREATININE 4.36*  --  4.86* 4.55*  CALCIUM 9.7  --  8.4* 7.7*  MG 2.1  --   --   --      CBC: Recent Labs  Lab 02/20/2023 0645 02/17/2023 0810 01/29/2023 1152  WBC 11.6*  --  19.8*  HGB 9.3* 10.9* 10.1*  HCT 31.1* 32.0* 31.4*  MCV 101.3*  --  94.3  PLT 181  --  224     No results found for: "HEPBSAG", "HEPBSAB", "HEPBIGM"    Microbiology:  Recent Results (from the past 240 hour(s))  MRSA Next Gen by PCR, Nasal     Status: None   Collection Time: 01/31/2023 10:59 AM   Specimen: Nasal Mucosa; Nasal Swab  Result Value Ref Range Status   MRSA by PCR Next Gen NOT DETECTED NOT DETECTED Final    Comment: (NOTE) The GeneXpert MRSA Assay (FDA approved for NASAL specimens only), is one component of a comprehensive MRSA colonization surveillance program. It is not intended to diagnose MRSA infection  nor to guide or monitor treatment for MRSA infections. Test performance is not FDA approved in patients less than 20 years old. Performed at [facility], [address] [place], [place] [address]     Coagulation Studies: No results for input(s): "LABPROT", "INR" in the last 72 hours.  Urinalysis: Recent Labs    02/11/2023 1051  COLORURINE YELLOW*  YELLOW*  LABSPEC 1.026  1.026  PHURINE 5.0  6.0  GLUCOSEU >=500*  >=500*  HGBUR MODERATE*  MODERATE*  BILIRUBINUR NEGATIVE  NEGATIVE  KETONESUR NEGATIVE  NEGATIVE  PROTEINUR >=300*  >=300*  NITRITE NEGATIVE  NEGATIVE  LEUKOCYTESUR NEGATIVE  NEGATIVE      Imaging: EEG adult  Result Date:  02/17/2023 Charlsie Quest, MD     02/20/2023  1:40 PM Patient Name: SELMA BRYER MRN: [id number] Epilepsy Attending: Charlsie Quest Referring Physician/Provider: Judithe Modest, NP Date: 02/04/2023 Duration: 25.50 mins Patient history: 57 year old male status post cardiac arrest.  EEG evaluate for seizure. Level of alertness: comatose AEDs during EEG study: LEV, versed Technical aspects: This EEG study was done with scalp electrodes positioned according to the 10-20 International system of electrode placement. Electrical activity was reviewed with band pass filter of 1-70Hz , sensitivity of 7 uV/mm, display speed of 14mm/sec with a 60Hz  notched filter applied as appropriate. EEG data were recorded continuously and digitally stored.  Video monitoring was available and reviewed as appropriate. Description: Patient was noted to have episodes of brief sudden eye opening with whole body jerking every few seconds.  Concomitant EEG showed generalized polyspikes consistent with myoclonic seizures.  In between seizures EEG showed generalized background suppression. Hyperventilation and photic stimulation were not performed.   ABNORMALITY -Myoclonic seizure, generalized -Background suppression, generalized IMPRESSION: Patient was noted to have myoclonic seizures every few seconds.  Additionally there was evidence of severe to profound diffuse encephalopathy.  In the setting of cardiac arrest, this EEG pattern is concerning for anoxic/hypoxic brain injury. Harlon Ditty, NP was notified. Charlsie Quest   DG Abd 1 View  Result Date: 01/30/2023 CLINICAL DATA:  57 year old male intubated.  Enteric tube placement. EXAM: ABDOMEN - 1 VIEW COMPARISON:  Portable chest reported separately at the same time. CT Abdomen and Pelvis 10/31/2010. FINDINGS: Portable AP supine view at 1140 hours. Enteric tube terminates in the proximal stomach, side hole is at the level of the GEJ. Paucity of bowel gas in the upper abdomen.  Stable visible lower chest as reported separately. IMPRESSION: Enteric tube placed into the stomach but side hole is at the level of the GEJ. Advance 6 cm to ensure side hole placement within the stomach. Electronically Signed   By: Odessa Fleming M.D.   On: 02/15/2023 11:54   DG Chest Port 1 View  Result Date: 02/05/2023 CLINICAL DATA:  57 year old male intubated. EXAM: PORTABLE CHEST 1 VIEW COMPARISON:  Chest radiographs 06/21/2013, chest CT 08/23/2021 and earlier. FINDINGS: Portable AP supine view at 1138 hours. Endotracheal tube tip at the level the clavicles. Enteric tube courses to the abdomen, tip not included. Right IJ central line, dual lumen dialysis type with catheter tips extending to the lower SVC level. Superimposed left IJ approach single lumen vascular catheter, tip projects at the confluence of the innominate veins. Mediastinal contours remain within normal limits. Coarse bilateral perihilar and interstitial opacity, slight upper lobe predominance. No pneumothorax or pleural effusion identified on this supine view. Chronic right clavicle fracture. IMPRESSION: 1. ETT tip at the level the clavicles. Enteric tube courses to  the abdomen, tip not included. Right IJ dialysis type catheter tips at the lower SVC level. Left IJ single lumen catheter terminates at the confluence of the innominate veins. 2. No pneumothorax on this supine view. Coarse and confluent bilateral perihilar and interstitial opacity is nonspecific. Top differential considerations include acute pulmonary edema, bilateral pneumonia. Electronically Signed   By: Odessa Fleming M.D.   On: 01/27/2023 11:53   CARDIAC CATHETERIZATION  Result Date: 02/03/2023   2nd Mrg lesion is 99% stenosed.   1st Mrg lesion is 75% stenosed.   Mid Cx to Dist Cx lesion is 90% stenosed.   Ost LAD to Prox LAD lesion is 99% stenosed.   Mid LAD lesion is 50% stenosed.   Prox RCA lesion is 30% stenosed.   Mid RCA lesion is 20% stenosed.   A drug-eluting stent was  successfully placed using a STENT ONYX FRONTIER 3.0X08.   Post intervention, there is a 0% residual stenosis.   There is severe left ventricular systolic dysfunction.   LV end diastolic pressure is mildly elevated.   The left ventricular ejection fraction is 25-35% by visual estimate. 1.  Cardiac arrest 2.  Anterior STEMI 3.  Successful primary PCI with 3.0 x 8 mm Onyx frontier DES ostial LAD 4.  Residual 99% stenosis OM 2 which appears chronic and collateralized 5.  Severe dilated cardiomyopathy with estimated LV ejection fraction 25% 6.  Guarded prognosis Recommendations 1.  Continue IV cangrelor until patient can take oral platelet inhibitor 2.  Good medical management after extubation     Medications:    sodium chloride     sodium chloride     sodium chloride     sodium chloride 1 mL/kg/hr (02/10/2023 1335)   amiodarone 30 mg/hr (01/30/2023 1321)   cefTRIAXone (ROCEPHIN)  IV 1 g (02/17/2023 1349)   fentaNYL infusion INTRAVENOUS 50 mcg/hr (02/13/2023 1427)   heparin     insulin 9 Units/hr (02/20/2023 1545)   [START ON 02/19/2023] levETIRAcetam     levETIRAcetam     midazolam 2 mg/hr (01/27/2023 1027)   norepinephrine (LEVOPHED) Adult infusion 2 mcg/min (02/12/2023 1404)   propofol (DIPRIVAN) infusion 5 mcg/kg/min (01/29/2023 1325)    amiodarone  150 mg Intravenous Once   [START ON 02/19/2023] aspirin  81 mg Oral Daily   Chlorhexidine Gluconate Cloth  6 each Topical Daily   [START ON 02/19/2023] Chlorhexidine Gluconate Cloth  6 each Topical Q0600   docusate  100 mg Per Tube BID   pantoprazole (PROTONIX) IV  40 mg Intravenous Daily   polyethylene glycol  17 g Per Tube Daily   sodium chloride flush  3 mL Intravenous Q12H   sodium chloride flush  3 mL Intravenous Q12H   sodium zirconium cyclosilicate  10 g Per Tube BID   ticagrelor  90 mg Per Tube BID   sodium chloride, sodium chloride, acetaminophen, dextrose, fentaNYL, heparin, levETIRAcetam, midazolam, sodium chloride flush, sodium chloride  flush  Assessment/ Plan:  57 y.o. male with  history of end-stage renal disease ,peritoneal dialysis, history of hepatitis B infection, history of positive PPD treated with INH/rifampin in the past, renal transplantation April 2016 which failed recently-hemodialysis restarted, current smoker was admitted on 01/31/2023 for  Principal Problem:   Acute ST elevation myocardial infarction (STEMI) involving left anterior descending (LAD) coronary artery (HCC)  Acute ST elevation myocardial infarction (STEMI) involving left anterior descending (LAD) coronary artery (HCC) [I21.02]  #. ESRD with hyperkalemia Patient has right IJ tunneled dialysis catheter in place.  His last dialysis treatment was on Saturday. We will arrange for emergent hemodialysis today to correct electrolyte abnormalities. Orders placed.  #. Anemia of CKD  Lab Results  Component Value Date   HGB 10.1 (L) 02/04/2023   Low dose EPO with HD if hemoglobin is trending lower.  #. Secondary hyperparathyroidism of renal origin N 25.81   No results found for: "PTH" Lab Results  Component Value Date   PHOS 3.9 02/12/2022   Monitor calcium and phos level during this admission   #. Diabetes type 2 with CKD Hemoglobin A1C (no units)  Date Value  08/08/2022 7.5  Currently managed with NovoLog.  #STEMI Complicated by cardiac arrest, 15 minutes of ACLS, acute respiratory failure requiring ventilator support, possible anoxic brain injury. -Patient is status post emergent cardiac catheterization showing severe ostial LAD stenosis and subtotal occlusion of the mid left circumflex, EF of 20 to 25%.  Difficult PCI to ostial LAD.    LOS: 0 Linsy Ehresman Thedore Mins 11/25/20243:46 PM  [facility] [place], [place] [phone]

## 2023-02-18 NOTE — Code Documentation (Signed)
2130 calcium chloride 10% 1g adm

## 2023-02-18 NOTE — Consult Note (Signed)
[facility] Cardiology  CARDIOLOGY CONSULT NOTE  Patient ID: James Moreno MRN: [id number] DOB/AGE: 1965-09-22 57 y.o.  Admit date: 02/06/2023 Referring Physician Ward Primary Physician Fisher Primary Cardiologist Gollan Reason for Consultation anterior STEMI  HPI: 57 year old gentleman referred for anterior STEMI.  Patient with history of transplant for/2016, with recurrent renal failure on peritoneal dialysis, converted to hemodialysis.  He was in his usual state of health until today, developed shortness of breath and confusion, after EMS arrival, patient became bradycardic and went into asystole, brought to [facility] ED with CPR in progress.  The patient received multiple rounds of ACLS, ECG revealed ST elevation in leads V2 through V4.  Patient eventually hemodynamically stable after intubation, epinephrine and Levophed IV infusion.  Patient underwent emergent cardiac catheterization which revealed 99% stenosis ostial LAD, and chronically appearing 99% stenosis OM 2 with bridging collaterals.  The patient underwent primary PCI receiving 3.0 x 8 mm Onyx frontier drug-eluting stent ostial LAD.  Left ventriculography revealed fairly reduced left ventricular function with estimated LV ejection fraction of 25%.  Review of systems complete and found to be negative unless listed above     Past Medical History:  Diagnosis Date   Acute kidney failure, unspecified (HCC)    Anxiety    Diabetes mellitus, type 2 (HCC)    GERD (gastroesophageal reflux disease)    History of arterial disease of lower extremity    History of hepatitis B    Hypertension    Hypothyroidism    Mitral valve disorders(424.0)    Pityriasis 12/27/2014   Primary pulmonary HTN (HCC)    Pt denies ever having.   Tricuspid valve disorders, specified as nonrheumatic     Past Surgical History:  Procedure Laterality Date   APPENDECTOMY     Carotid Doppler Ultrasound  06/14/2009   39% stenosis of bilateral internal carotid artery,  bilateral anterograde vertebral flow   DIALYSIS/PERMA CATHETER INSERTION N/A 02/04/2023   Procedure: DIALYSIS/PERMA CATHETER INSERTION;  Surgeon: Annice Needy, MD;  Location: [facility] INVASIVE CV LAB;  Service: Cardiovascular;  Laterality: N/A;   ESOPHAGOGASTRODUODENOSCOPY (EGD) WITH PROPOFOL N/A 01/21/2019   Procedure: ESOPHAGOGASTRODUODENOSCOPY (EGD) WITH PROPOFOL;  Surgeon: Toney Reil, MD;  Location: [facility];  Service: Endoscopy;  Laterality: N/A;  Diabetic - insulin   EYE SURGERY     right   HEMORROIDECTOMY     KIDNEY TRANSPLANT Right 2016   MECKEL DIVERTICULUM EXCISION     infancy   Myocardial Perfusion scan  01/17/2009   [facility], non- ischemic. LVEF= 55%   PARS PLANA VITRECTOMY Left 02/09/2015   Procedure: Pan retinal photocoagulation 08657;  Surgeon: Marcelene Butte, MD;  Location: [facility] ORS;  Service: Ophthalmology;  Laterality: Left;   REFRACTIVE SURGERY Left    sleep study  01/09/2011   Severe sleep apnea. AHI 72.9/hr. RDI=83.0/hr. Desaturation to 69.0% Emergency CPAP titaration to 14.0cm (01/14/19 resolved after wt loss after kidney transplant.)    Medications Prior to Admission  Medication Sig Dispense Refill Last Dose   alprazolam (XANAX) 2 MG tablet TAKE 1 TABLET BY MOUTH THREE TIMES DAILY AS NEEDED FOR ANXIETY 90 tablet 2    amLODipine (NORVASC) 10 MG tablet Take 10 mg by mouth daily.       atorvastatin (LIPITOR) 80 MG tablet Take 1 tablet (80 mg total) by mouth daily. 90 tablet 1    BD INSULIN SYRINGE U/F 31G X 5/16" 1 ML MISC  (Patient not taking: Reported on 01/21/2023)      calcitRIOL (ROCALTROL)  0.25 MCG capsule Take 1 capsule (0.25 mcg total) by mouth daily. 10 capsule 0    carvedilol (COREG) 12.5 MG tablet Take 1.5 tablets (18.75 mg total) by mouth 2 (two) times daily with a meal. 270 tablet 3    chlorhexidine (PERIDEX) 0.12 % solution SMARTSIG:0.5 Capful(s) By Mouth Twice Daily (Patient not taking: Reported on 02/04/2023)      cinacalcet  (SENSIPAR) 30 MG tablet Take 30 mg by mouth daily.      [name] 10 MG TABS tablet TAKE 1 TABLET BY MOUTH BEFORE BREAKFAST 30 tablet 0    insulin aspart (NOVOLOG) 100 UNIT/ML injection Inject 8 Units into the skin 3 (three) times daily before meals. and reports adjusting per sliding scale from Endocrinologist if blood sugar greater than 120 prior to mea      insulin degludec (TRESIBA FLEXTOUCH) 100 UNIT/ML FlexTouch Pen Inject 39 Units into the skin daily. Patient receives via [name] Patient Assistance through Dec 2023 (Patient taking differently: Inject 18 Units into the skin daily. Patient receives via [name] Patient Assistance through Dec 2023)      losartan (COZAAR) 100 MG tablet Take 1 tablet (100 mg total) by mouth at bedtime. 10 tablet 0    mycophenolate (CELLCEPT) 500 MG tablet Take 1,000 mg by mouth 2 (two) times daily.      naloxone (NARCAN) nasal spray 4 mg/0.1 mL Place 1 spray into the nose once.      omeprazole (PRILOSEC) 20 MG capsule Take 20 mg by mouth daily as needed.      oxyCODONE (OXY IR/ROXICODONE) 5 MG immediate release tablet Take 1-2 tablets (5-10 mg total) by mouth every 6 (six) hours as needed for severe pain (pain score 7-10). 240 tablet 0    Semaglutide,0.25 or 0.5MG /DOS, (OZEMPIC, 0.25 OR 0.5 MG/DOSE,) 2 MG/3ML SOPN Inject 0.5 mg into the skin once a week. Patient receives via [name] Patient Assistance (Patient taking differently: Inject 1 mg into the skin once a week. Patient receives via [name] Patient Assistance)      sildenafil (REVATIO) 20 MG tablet  (Patient not taking: Reported on 01/21/2023)      sildenafil (VIAGRA) 100 MG tablet Take 1 tab 1 hour prior to intercourse 90 tablet 0    tacrolimus (PROGRAF) 1 MG capsule Take 3 mg by mouth 2 (two) times daily.   11    tamsulosin (FLOMAX) 0.4 MG CAPS capsule Take 1 capsule (0.4 mg total) by mouth daily. 90 capsule 1    tretinoin (RETIN-A) 0.05 % cream Apply topically as needed.       triamcinolone  cream (KENALOG) 0.1 % APPLY DAILY TO INFLAMED BUMPS AS NEEDED      XYOSTED 50 MG/0.5ML SOAJ  (Patient not taking: Reported on 01/21/2023)      Social History   Socioeconomic History   Marital status: Divorced    Spouse name: Not on file   Number of children: 1   Years of education: Not on file   Highest education level: Associate degree: occupational, Scientist, product/process development, or vocational program  Occupational History   Occupation: Surveyor, minerals    Comment: on disability  Tobacco Use   Smoking status: Every Day    Current packs/day: 1.00    Average packs/day: 1 pack/day for 41.0 years (41.0 ttl pk-yrs)    Types: Cigarettes   Smokeless tobacco: Never   Tobacco comments:    since age 27.    Smokes 1/2 PPD; decreased on 12/22/2022.   Vaping Use  Vaping status: Former  Substance and Sexual Activity   Alcohol use: Not Currently    Alcohol/week: 0.0 - 1.0 standard drinks of alcohol    Comment: Excessive alcohol consumption in the past. Quit around 2016. .   Drug use: No   Sexual activity: Not on file  Other Topics Concern   Not on file  Social History Narrative   Not on file   Social Determinants of Health   Financial Resource Strain: Medium Risk (03/01/2022)   Overall Financial Resource Strain (CARDIA)    Difficulty of Paying Living Expenses: Somewhat hard  Food Insecurity: No Food Insecurity (03/01/2022)   Hunger Vital Sign    Worried About Running Out of Food in the Last Year: Never true    Ran Out of Food in the Last Year: Never true  Transportation Needs: No Transportation Needs (03/01/2022)   PRAPARE - Administrator, Civil Service (Medical): No    Lack of Transportation (Non-Medical): No  Physical Activity: Inactive (03/01/2022)   Exercise Vital Sign    Days of Exercise per Week: 0 days    Minutes of Exercise per Session: 0 min  Stress: Stress Concern Present (03/01/2022)   [employer] of Occupational Health - Occupational Stress Questionnaire    Feeling of  Stress : To some extent  Social Connections: Moderately Integrated (03/01/2022)   Social Connection and Isolation Panel [NHANES]    Frequency of Communication with Friends and Family: More than three times a week    Frequency of Social Gatherings with Friends and Family: Once a week    Attends Religious Services: More than 4 times per year    Active Member of [employer] or Organizations: No    Attends Banker Meetings: Never    Marital Status: Living with partner  Recent Concern: Social Connections - Moderately Isolated (02/28/2022)   Social Connection and Isolation Panel [NHANES]    Frequency of Communication with Friends and Family: More than three times a week    Frequency of Social Gatherings with Friends and Family: Once a week    Attends Religious Services: More than 4 times per year    Active Member of [employer] or Organizations: No    Attends Banker Meetings: Never    Marital Status: Divorced  Catering manager Violence: Not At Risk (02/28/2022)   Humiliation, Afraid, Rape, and Kick questionnaire    Fear of Current or Ex-Partner: No    Emotionally Abused: No    Physically Abused: No    Sexually Abused: No    Family History  Problem Relation Age of Onset   Hypertension Mother    Hyperlipidemia Mother    Melanoma Father       Review of systems complete and found to be negative unless listed above      PHYSICAL EXAM  General: Well developed, well nourished, in no acute distress HEENT:  Normocephalic and atramatic Neck:  No JVD.  Lungs: Clear bilaterally to auscultation and percussion. Heart: HRRR . Normal S1 and S2 without gallops or murmurs.  Abdomen: Bowel sounds are positive, abdomen soft and non-tender  Msk:  Back normal, normal gait. Normal strength and tone for age. Extremities: No clubbing, cyanosis or edema.   Neuro: Alert and oriented X 3. Psych:  Good affect, responds appropriately  Labs:   Lab Results  Component Value Date   WBC 11.6  (H) 02/13/2023   HGB 10.9 (L) 02/05/2023   HCT 32.0 (L) 02/13/2023   MCV 101.3 (  H) 02/05/2023   PLT 181 02/07/2023    Recent Labs  Lab 01/31/2023 0645 02/07/2023 0810  NA 128* 130*  K 3.4* 5.1  CL 93*  --   CO2 23  --   BUN 39*  --   CREATININE 4.36*  --   CALCIUM 9.7  --   GLUCOSE 621*  --    Lab Results  Component Value Date   TROPONINI <0.03 10/21/2015    Lab Results  Component Value Date   CHOL 141 06/20/2020   CHOL 103 12/12/2018   CHOL 166 01/10/2018   Lab Results  Component Value Date   HDL 46 06/20/2020   HDL 40 12/12/2018   HDL 30 (L) 01/10/2018   Lab Results  Component Value Date   LDLCALC 53 06/20/2020   LDLCALC 45 12/12/2018   LDLCALC 77 01/10/2018   Lab Results  Component Value Date   TRIG 268 (H) 06/20/2020   TRIG 95 12/12/2018   TRIG 296 (H) 01/10/2018   Lab Results  Component Value Date   CHOLHDL 3.1 06/20/2020   CHOLHDL 5.5 (H) 01/10/2018   CHOLHDL 4.6 03/28/2016   No results found for: "LDLDIRECT"    Radiology: CARDIAC CATHETERIZATION  Result Date: 01/26/2023   2nd Mrg lesion is 99% stenosed.   1st Mrg lesion is 75% stenosed.   Mid Cx to Dist Cx lesion is 90% stenosed.   Ost LAD to Prox LAD lesion is 99% stenosed.   Mid LAD lesion is 50% stenosed.   Prox RCA lesion is 30% stenosed.   Mid RCA lesion is 20% stenosed.   A drug-eluting stent was successfully placed using a STENT ONYX FRONTIER 3.0X08.   Post intervention, there is a 0% residual stenosis.   There is severe left ventricular systolic dysfunction.   LV end diastolic pressure is mildly elevated.   The left ventricular ejection fraction is 25-35% by visual estimate. 1.  Cardiac arrest 2.  Anterior STEMI 3.  Successful primary PCI with 3.0 x 8 mm Onyx frontier DES ostial LAD 4.  Residual 99% stenosis OM 2 which appears chronic and collateralized 5.  Severe dilated cardiomyopathy with estimated LV ejection fraction 25% 6.  Guarded prognosis Recommendations 1.  Continue IV cangrelor until  patient can take oral platelet inhibitor 2.  Good medical management after extubation   PERIPHERAL VASCULAR CATHETERIZATION  Result Date: 02/04/2023 See surgical note for result.   EKG: Sinus rhythm with ST elevation in leads V2 through V4  ASSESSMENT AND PLAN:   1.  Anterior STEMI by ECG, as post successful primary PCI with 3.0 x 8 mm Onyx frontier DES ostial LAD 2.  Severe dilated cardiomyopathy with estimated LV ejection fraction 25% 3.  Cardiac arrest, status post CPR, multiple rounds of ACLS, intubated, currently unresponsive  Recommendations  1.  Continue IV cangrelor the patient able to take oral platelet inhibitor 2.  2D echocardiogram 3.  Gradual tapering of IV epinephrine and Levophed 4.  Eventual good medical management 5.  Neurological evaluation  Signed: Marcina Millard MD,PhD, [facility] 02/20/2023, 10:18 AM

## 2023-02-18 NOTE — Code Documentation (Signed)
1610 Calcium chloride 10 % adm

## 2023-02-18 NOTE — Code Documentation (Signed)
0644 pulse check, positive pulses, hold compressions

## 2023-02-18 NOTE — Code Documentation (Signed)
1610 NS stopped per MD

## 2023-02-18 NOTE — Code Documentation (Signed)
0631 epi adm 1mg 

## 2023-02-18 NOTE — Progress Notes (Signed)
PHARMACY CONSULT NOTE - FOLLOW UP  Pharmacy Consult for Electrolyte Monitoring and Replacement   Recent Labs: Potassium (mmol/L)  Date Value  02/04/2023 5.4 (H)  06/21/2013 4.1   Magnesium (mg/dL)  Date Value  95/28/4132 2.1   Calcium (mg/dL)  Date Value  44/03/270 7.7 (L)   Calcium, Total (mg/dL)  Date Value  53/66/4403 7.3 (L)   Albumin (g/dL)  Date Value  47/42/5956 2.8 (L)  02/12/2022 3.9  06/21/2013 3.1 (L)   Phosphorus (mg/dL)  Date Value  38/75/6433 3.9   Sodium (mmol/L)  Date Value  02/15/2023 131 (L)  02/12/2022 130 (L)  06/21/2013 132 (L)   Corrected Ca: 8.7 mg/dL  Assessment: 57 y.o. male w/ PMH of  HTN, DM, ESRD s/p renal transplant currently on hemodialysis who presents to the emergency department in cardiac arrest. Patient is now on an insulin infusion  Goal of Therapy:  Potassium 4.0 - 5.1 mmol/L Magnesium 2.0 - 2.4 mg/dL All Other Electrolytes WNL  Plan:  ---Lokelma 10 grams per tube x 2 (next dose pending/upcoming) ---recheck BMP every 4 hours while on insulin infusion  Lowella Bandy ,PharmD Clinical Pharmacist 01/26/2023 3:42 PM

## 2023-02-18 NOTE — Procedures (Signed)
Keene placing lines at this time-will do eeg when finished.

## 2023-02-18 NOTE — ED Provider Notes (Signed)
[facility] Provider Note    Event Date/Time   First MD Initiated Contact with Patient 02/12/2023 [phone]     (approximate)   History   Cardiac Arrest   HPI  James Moreno is a 57 y.o. male with history of hypertension, diabetes, end stage renal disease status post renal transplant currently on hemodialysis who presents to the emergency department with EMS in cardiac arrest.  EMS reports they were called out for shortness of breath.  They report that he seemed confused, gray in appearance on their arrival.  Patient sats were in the 70s on room air.  Wife reports he did have dialysis on Saturday.  They immediately got patient to the ambulance.  EMS obtained an EKG that showed anterior lateral ST elevation MI.  Code STEMI activated by EMS at 5:59 AM. He received full dose aspirin and 1 nitroglycerin to EMS.  Patient then became bradycardic in the 30s.  He then lost pulses and was in asystole.  CPR started at 6:09 AM.  EMS reports they continued resuscitative measures for about 15 minutes.  He received 2 rounds of epinephrine with EMS.  Patient being ventilated by BVM on arrival and receiving active chest compressions.  Last EPI 7-8 minutes prior to arrival.  Cardiology at bedside upon patient's arrival.   History provided by EMS, wife.    Past Medical History:  Diagnosis Date   Acute kidney failure, unspecified (HCC)    Anxiety    Diabetes mellitus, type 2 (HCC)    GERD (gastroesophageal reflux disease)    History of arterial disease of lower extremity    History of hepatitis B    Hypertension    Hypothyroidism    Mitral valve disorders(424.0)    Pityriasis 12/27/2014   Primary pulmonary HTN (HCC)    Pt denies ever having.   Tricuspid valve disorders, specified as nonrheumatic     Past Surgical History:  Procedure Laterality Date   APPENDECTOMY     Carotid Doppler Ultrasound  06/14/2009   39% stenosis of bilateral internal carotid artery, bilateral  anterograde vertebral flow   DIALYSIS/PERMA CATHETER INSERTION N/A 02/04/2023   Procedure: DIALYSIS/PERMA CATHETER INSERTION;  Surgeon: Annice Needy, MD;  Location: [facility] INVASIVE CV LAB;  Service: Cardiovascular;  Laterality: N/A;   ESOPHAGOGASTRODUODENOSCOPY (EGD) WITH PROPOFOL N/A 01/21/2019   Procedure: ESOPHAGOGASTRODUODENOSCOPY (EGD) WITH PROPOFOL;  Surgeon: Toney Reil, MD;  Location: [facility];  Service: Endoscopy;  Laterality: N/A;  Diabetic - insulin   EYE SURGERY     right   HEMORROIDECTOMY     KIDNEY TRANSPLANT Right 2016   MECKEL DIVERTICULUM EXCISION     infancy   Myocardial Perfusion scan  01/17/2009   [facility], non- ischemic. LVEF= 55%   PARS PLANA VITRECTOMY Left 02/09/2015   Procedure: Pan retinal photocoagulation 84696;  Surgeon: Marcelene Butte, MD;  Location: [facility] ORS;  Service: Ophthalmology;  Laterality: Left;   REFRACTIVE SURGERY Left    sleep study  01/09/2011   Severe sleep apnea. AHI 72.9/hr. RDI=83.0/hr. Desaturation to 69.0% Emergency CPAP titaration to 14.0cm (01/14/19 resolved after wt loss after kidney transplant.)    MEDICATIONS:  Prior to Admission medications   Medication Sig Start Date End Date Taking? Authorizing Provider  alprazolam Prudy Feeler) 2 MG tablet TAKE 1 TABLET BY MOUTH THREE TIMES DAILY AS NEEDED FOR ANXIETY 01/21/23   [name], MD  amLODipine (NORVASC) 10 MG tablet Take 10 mg by mouth daily.  01/02/19  [provider]  atorvastatin (LIPITOR) 80 MG tablet Take 1 tablet (80 mg total) by mouth daily. 10/18/22   [name], MD  BD INSULIN SYRINGE U/F 31G X 5/16" 1 ML MISC  12/29/18   [provider]  calcitRIOL (ROCALTROL) 0.25 MCG capsule Take 1 capsule (0.25 mcg total) by mouth daily. 04/11/21   [name], MD  carvedilol (COREG) 12.5 MG tablet Take 1.5 tablets (18.75 mg total) by mouth 2 (two) times daily with a meal. 01/21/23   Gollan, Tollie Pizza, MD  chlorhexidine (PERIDEX) 0.12 %  solution SMARTSIG:0.5 Capful(s) By Mouth Twice Daily Patient not taking: Reported on 02/04/2023 12/25/22   [provider]  cinacalcet (SENSIPAR) 30 MG tablet Take 30 mg by mouth daily. 11/22/22   [provider]  [name] 10 MG TABS tablet TAKE 1 TABLET BY MOUTH BEFORE BREAKFAST 10/23/22   [name], MD  insulin aspart (NOVOLOG) 100 UNIT/ML injection Inject 8 Units into the skin 3 (three) times daily before meals. and reports adjusting per sliding scale from Endocrinologist if blood sugar greater than 120 prior to mea    [provider]  insulin degludec (TRESIBA FLEXTOUCH) 100 UNIT/ML FlexTouch Pen Inject 39 Units into the skin daily. Patient receives via [name] Patient Assistance through Dec 2023 Patient taking differently: Inject 18 Units into the skin daily. Patient receives via [name] Patient Assistance through Dec 2023 07/17/22   [name], MD  losartan (COZAAR) 100 MG tablet Take 1 tablet (100 mg total) by mouth at bedtime. 04/11/21   [name], MD  mycophenolate (CELLCEPT) 500 MG tablet Take 1,000 mg by mouth 2 (two) times daily.    [provider]  naloxone [facility]) nasal spray 4 mg/0.1 mL Place 1 spray into the nose once.    [provider]  omeprazole (PRILOSEC) 20 MG capsule Take 20 mg by mouth daily as needed.    [provider]  oxyCODONE (OXY IR/ROXICODONE) 5 MG immediate release tablet Take 1-2 tablets (5-10 mg total) by mouth every 6 (six) hours as needed for severe pain (pain score 7-10). 02/05/23   [name], MD  Semaglutide,0.25 or 0.5MG /DOS, (OZEMPIC, 0.25 OR 0.5 MG/DOSE,) 2 MG/3ML SOPN Inject 0.5 mg into the skin once a week. Patient receives via [name] Patient Assistance Patient taking differently: Inject 1 mg into the skin once a week. Patient receives via [name] Patient Assistance 05/28/22   [name], MD  sildenafil (REVATIO) 20 MG tablet  05/26/21   [provider]   sildenafil (VIAGRA) 100 MG tablet Take 1 tab 1 hour prior to intercourse 10/31/22   Stoioff, Verna Czech, MD  tacrolimus (PROGRAF) 1 MG capsule Take 3 mg by mouth 2 (two) times daily.  01/11/15   [provider]  tamsulosin (FLOMAX) 0.4 MG CAPS capsule Take 1 capsule (0.4 mg total) by mouth daily. 10/18/22   [name], MD  tretinoin (RETIN-A) 0.05 % cream Apply topically as needed.     [provider]  triamcinolone cream (KENALOG) 0.1 % APPLY DAILY TO INFLAMED BUMPS AS NEEDED 10/06/18   [provider]  XYOSTED 50 MG/0.5ML SOAJ  05/26/21   [provider]    Physical Exam   Triage Vital Signs: ED Triage Vitals [02/15/2023 0615]  Encounter Vitals Group     BP      Systolic BP Percentile      Diastolic BP Percentile      Pulse  Resp      Temp      Temp src      SpO2 92 %     Weight      Height 5' 10.98" (1.803 m)     Head Circumference      Peak Flow      Pain Score      Pain Loc      Pain Education      Exclude from Growth Chart     Most recent vital signs: Vitals:   02/23/2023 0615 02/07/2023 0711  SpO2: 92% 96%    CONSTITUTIONAL: GCS 3 HEAD: Normocephalic, atraumatic EYES: Conjunctivae clear, pupils 5 mm and fixed ENT: normal nose; white frothy sputum in posterior oropharynx CARD: No pulses CHEST:   Patient has a dialysis catheter in the right chest wall RESP: Rhonchorous breath sounds with bagging, no spontaneous respirations ABD/GI: Non-distended EXT: No edema SKIN: Skin is gray in appearance, cool to touch NEURO: GCS 3   ED Results / Procedures / Treatments   LABS: (all labs ordered are listed, but only abnormal results are displayed) Labs Reviewed  CBC - Abnormal; Notable for the following components:      Result Value   WBC 11.6 (*)    RBC 3.07 (*)    Hemoglobin 9.3 (*)    HCT 31.1 (*)    MCV 101.3 (*)    MCHC 29.9 (*)    All other components within normal limits  CBG MONITORING, ED - Abnormal; Notable for the  following components:   Glucose-Capillary 224 (*)    All other components within normal limits  BASIC METABOLIC PANEL  LACTIC ACID, PLASMA  MAGNESIUM  BRAIN NATRIURETIC PEPTIDE  TROPONIN I (HIGH SENSITIVITY)     EKG:  EKG Interpretation Date/Time:  Monday February 18 2023 06:37:20 EST Ventricular Rate:  79 PR Interval:    QRS Duration:  100 QT Interval:  444 QTC Calculation: 509 R Axis:   5  Text Interpretation: Atrial fibrillation Ventricular premature complex Probable anteroseptal infarct, recent Prolonged QT interval Confirmed by Rochele Raring [phone]) on 02/01/2023 7:11:08 AM        EMS 12 lead prior to cardiac arrest at 5:47 AM   RADIOLOGY: My personal review and interpretation of imaging:    I have personally reviewed all radiology reports.   No results found.   PROCEDURES:  Critical Care performed: Yes, see critical care procedure note(s)   CRITICAL CARE Performed by: Rochele Raring   Total critical care time: 30 minutes  Critical care time was exclusive of separately billable procedures and treating other patients.  Critical care was necessary to treat or prevent imminent or life-threatening deterioration.  Critical care was time spent personally by me on the following activities: development of treatment plan with patient and/or surrogate as well as nursing, discussions with consultants, evaluation of patient's response to treatment, examination of patient, obtaining history from patient or surrogate, ordering and performing treatments and interventions, ordering and review of laboratory studies, ordering and review of radiographic studies, pulse oximetry and re-evaluation of patient's condition.    INTUBATION Performed by: Rochele Raring  Required items: required blood products, implants, devices, and special equipment available Patient identity confirmed: provided demographic data and hospital-assigned identification number Time out: Immediately  prior to procedure a "time out" was called to verify the correct patient, procedure, equipment, support staff and site/side marked as required.  Indications: Cardiac arrest, respiratory failure  Intubation method: Glidescope Laryngoscopy   Preoxygenation: BVM  Sedatives:  none Paralytic: none  Tube Size: 7.5 cuffed, 23 cm at teeth  Post-procedure assessment: chest rise and ETCO2 monitor Breath sounds: equal and absent over the epigastrium Tube secured with: ETT holder Chest x-ray interpreted by radiologist and me.  Chest x-ray findings: delayed due to patient going emergently to cath lab  Patient tolerated the procedure well with no immediate complications.   Cardiopulmonary Resuscitation (CPR) Procedure Note Directed/Performed by: Rochele Raring I personally directed ancillary staff and/or performed CPR in an effort to regain return of spontaneous circulation and to maintain cardiac, neuro and systemic perfusion.   Marland Kitchen1-3 Lead EKG Interpretation  Performed by: Bettyjean Stefanski, Layla Maw, DO Authorized by: Alyah Boehning, Layla Maw, DO     Interpretation: abnormal     ECG rate:  88   ECG rate assessment: normal     Rhythm: atrial fibrillation     Ectopy: PVCs     Conduction: normal       IMPRESSION / MDM / ASSESSMENT AND PLAN / ED COURSE  I reviewed the triage vital signs and the nursing notes.    Patient here in cardiac arrest.  Found to have anterior lateral STEMI and was hypoxic to the 70s with EMS.  The patient is on the cardiac monitor to evaluate for evidence of arrhythmia and/or significant heart rate changes.   DIFFERENTIAL DIAGNOSIS (includes but not limited to):   STEMI, CHF exacerbation, cardiac arrest 2/2 hypoxia, arrhythmia   Patient's presentation is most consistent with acute presentation with potential threat to life or bodily function.   PLAN: Patient presented to the emergency department with EMS for shortness of breath and found to have acute hypoxia and anterior  lateral STEMI.  Code STEMI activated by EMS.  In route patient became bradycardic and then went into asystole.  Here patient is in asystole.  CPR continued and patient received multiple rounds of epinephrine, bicarb, calcium.  Please see nursing notes.  We obtained ROSC briefly for several minutes and then patient became bradycardic, given 1 mg of IV atropine and went into PEA.  Another round of medications and CPR were administered and we obtained ROSC again.  IV Levophed, IV epinephrine infusions started.  Bedside ultrasound performed by my colleague which showed decreased ejection fraction but no pericardial effusion.    Patient was intubated with 7.5 endotracheal tube.  He did have a large amount of frothy white sputum during intubation and was quite difficult to ventilate with a BVM.  Required 100% FiO2 and PEEP of 10 to keep oxygen saturations in the low 90s.  EKG here in the ED showed anterior lateral ST elevation but also Q waves suggestive that MI has been going on for some time.  Patient had received aspirin with EMS.  Cardiology asked that we hold heparin bolus.  Once patient had his second episode of ROSC and patient's blood pressures stable x 3 patient then taken emergently to the Cath Lab with Dr. Darrold Junker.    Patient had slightly dilated and nonreactive pupils.  No corneal reflex, gag reflex.  GCS 3 throughout time in ED. No sedation or paralytics given in the ED.   MEDICATIONS GIVEN IN ED: Medications  EPINEPHrine NaCl 5-0.9 MG/250ML-% premix infusion (has no administration in time range)  heparin 46962 UT/250ML infusion (has no administration in time range)     ED COURSE: Patient's wife updated with chaplain present.  She is aware of patient's critical condition and current poor neurologic function.   CONSULTS: Cardiology at bedside upon patient's arrival.  Appreciate their help.  Cardiology to take patient emergently to the Cath Lab.   OUTSIDE RECORDS REVIEWED: Reviewed last  nephrology note with Dr. Cherylann Ratel on 01/28/2023.       FINAL CLINICAL IMPRESSION(S) / ED DIAGNOSES   Final diagnoses:  Cardiac arrest (HCC)  Acute respiratory failure with hypoxia (HCC)  ST elevation myocardial infarction (STEMI), unspecified artery (HCC)     Rx / DC Orders   ED Discharge Orders     None        Note:  This document was prepared using Dragon voice recognition software and may include unintentional dictation errors.   Tanisia Yokley, Layla Maw, DO 02/04/2023 [phone]

## 2023-02-18 NOTE — Procedures (Signed)
Central Venous Catheter Insertion Procedure Note  CARPENTER PANNIER  [id number]  26-May-1965  Date:02/13/2023  Time:11:25 AM   Provider Performing:Sabrie Moritz D Elvina Sidle   Procedure: Insertion of Non-tunneled Central Venous Catheter(36556) with US guidance (78295)   Indication(s) Medication administration and Difficult access  Consent Unable to obtain consent due to emergent nature of procedure.  Anesthesia Topical only with 1% lidocaine   Timeout Verified patient identification, verified procedure, site/side was marked, verified correct patient position, special equipment/implants available, medications/allergies/relevant history reviewed, required imaging and test results available.  Sterile Technique Maximal sterile technique including full sterile barrier drape, hand hygiene, sterile gown, sterile gloves, mask, hair covering, sterile ultrasound probe cover (if used).  Procedure Description Area of catheter insertion was cleaned with chlorhexidine and draped in sterile fashion.  With real-time ultrasound guidance a central venous catheter was placed into the left internal jugular vein. Nonpulsatile blood flow and easy flushing noted in all ports.  The catheter was sutured in place and sterile dressing applied.  Complications/Tolerance None; patient tolerated the procedure well. Chest X-ray is ordered to verify placement for internal jugular or subclavian cannulation.   Chest x-ray is not ordered for femoral cannulation.  EBL Minimal  Specimen(s) None   Line secured at the 20 cm mark. BIOPATCH applied to the insertion site.   Harlon Ditty, AGACNP-BC [facility] Prefer epic messenger for cross cover needs If after hours, please call E-link

## 2023-02-18 NOTE — Code Documentation (Signed)
5784 1mg  epi adm

## 2023-02-18 NOTE — Code Documentation (Signed)
Calcium chloride 10% adm 1g @ [phone]

## 2023-02-18 NOTE — Procedures (Signed)
Arterial Catheter Insertion Procedure Note  James Moreno  [id number]  October 20, 1965  Date:01/28/2023  Time:11:26 AM    Provider Performing: Judithe Modest    Procedure: Insertion of Arterial Line (78295) with US guidance (62130)   Indication(s) Blood pressure monitoring and/or need for frequent ABGs  Consent Unable to obtain consent due to emergent nature of procedure.  Anesthesia None   Time Out Verified patient identification, verified procedure, site/side was marked, verified correct patient position, special equipment/implants available, medications/allergies/relevant history reviewed, required imaging and test results available.   Sterile Technique Maximal sterile technique including full sterile barrier drape, hand hygiene, sterile gown, sterile gloves, mask, hair covering, sterile ultrasound probe cover (if used).   Procedure Description Area of catheter insertion was cleaned with chlorhexidine and draped in sterile fashion. With real-time ultrasound guidance an arterial catheter was placed into the left radial artery.  Appropriate arterial tracings confirmed on monitor.     Complications/Tolerance None; patient tolerated the procedure well.   EBL Minimal   Specimen(s) None    BIOPATCH applied to the insertion site.   Harlon Ditty, AGACNP-BC [place] [facility] [facility] Prefer epic messenger for cross cover needs If after hours, please call E-link

## 2023-02-18 NOTE — Progress Notes (Signed)
Eeg done 

## 2023-02-18 NOTE — Code Documentation (Signed)
0630 pulse check PEA no pulses present. Resume CPR

## 2023-02-18 NOTE — Code Documentation (Signed)
Epi 1mg  adm

## 2023-02-18 NOTE — Inpatient Diabetes Management (Signed)
Inpatient Diabetes Program Recommendations  AACE/ADA: New Consensus Statement on Inpatient Glycemic Control (2015)  Target Ranges:  Prepandial:   less than 140 mg/dL      Peak postprandial:   less than 180 mg/dL (1-2 hours)      Critically ill patients:  140 - 180 mg/dL    Latest Reference Range & Units 02/08/2023 06:45  Sodium 135 - 145 mmol/L 128 (L)  Potassium 3.5 - 5.1 mmol/L 3.4 (L)  Chloride 98 - 111 mmol/L 93 (L)  CO2 22 - 32 mmol/L 23  Glucose 70 - 99 mg/dL 528 (HH)  BUN 6 - 20 mg/dL 39 (H)  Creatinine 4.13 - 1.24 mg/dL 2.44 (H)  Calcium 8.9 - 10.3 mg/dL 9.7  Anion gap 5 - 15  12  (HH): Data is critically high (L): Data is abnormally low (H): Data is abnormally high  Latest Reference Range & Units 02/01/2023 06:27 02/23/2023 10:22 01/30/2023 12:02  Glucose-Capillary 70 - 99 mg/dL 010 (H) 272 (H) 536 (H)  (H): Data is abnormally high   Admit with:  Out-of-Hospital Cardiac Arrest due to STEMI and cocaine intoxication  Underwent emergent Cardiac Cath with drug eluting stent to ostial LAD Now exhibiting Myoclonus with concern for anoxic brain injury   History: DM, ESRD  Home DM Meds: [name] 10 mg daily       Novolog 8 units TID with meals       Tresiba 18 units daily       Ozempic 1 mg Qweek  Current Orders: IV Insulin Drip     Due to start IV Insulin Drip this afternoon  Currently Intubated  Will follow   --Will follow patient during hospitalization--  Ambrose Finland RN, MSN, CDCES Diabetes Coordinator Inpatient Glycemic Control Team Team Pager: [phone] (8a-5p)

## 2023-02-18 NOTE — Code Documentation (Signed)
Bicarb adm 

## 2023-02-18 NOTE — Code Documentation (Signed)
1610 NS bolus running, Levophed running at per MD order

## 2023-02-18 NOTE — Progress Notes (Signed)
Advanced ET tube by 2 cm from 25 to 27 @ the lip, per order.

## 2023-02-18 NOTE — Code Documentation (Signed)
0626 pulse check = asystole, resume compressions

## 2023-02-18 NOTE — Code Documentation (Signed)
8469 epi drip at  0648 levo titrated down to  Per MD orders

## 2023-02-18 NOTE — Code Documentation (Signed)
Pt to cath lab pulses still intact

## 2023-02-18 NOTE — Code Documentation (Signed)
Per ems lost pulses at 0609, last epi adm at [facility]

## 2023-02-18 NOTE — Progress Notes (Signed)
Paged out as Stemi-turned to cardiac arrest. Arrived and family was not present-spoke with Dr. Elesa Massed and met with family to notify them of updates. Once Dr ward provided updates I walked them to Cath LAB Neysa Bonito, Daughter and daughters fianc) Offered Compassionate Presence and listening ear. Fianc rightfully nervous as she "felt something was off" and was feeling like she could of done more. Reassured her she did everything right by calling 911. Follow up would be beneficial

## 2023-02-18 NOTE — Progress Notes (Signed)
This is a patient of Dr. Mariah Milling with known history of end-stage renal disease status post kidney transplant with recent progression to end-stage renal disease requiring dialysis, type 2 diabetes, essential hypertension and obesity.  He was seen in our office in October with chest pain concerning for angina.  The patient was scheduled for a Lexiscan Myoview.  However, he presented with out-of-hospital cardiac arrest with CPR for about 15 minutes.  EKG showed anterolateral ST elevation.  Emergent cardiac catheterization was done by Dr. Darrold Junker via the right femoral artery.  It showed severe ostial LAD stenosis and subtotal occlusion of the mid left circumflex which appeared chronic.  PCI and drug-eluting stent placement was performed to the ostial LAD which was difficult due to calcifications.  EF was 20 to 25%..  The patient was noted to have myoclonic activities at the end of the procedure. The patient was loaded with Brilinta earlier today and then cangrelor was stopped after 2 hours. Continue supportive care for now.  Prognosis is guarded given uncertain neurologic status.  In spite of very low EF, the patient is not a candidate for mechanical support devices given his neurologic status. Our team will need to follow on a daily basis.

## 2023-02-18 NOTE — Code Documentation (Signed)
All one step meds ordered per MD present during code see free text

## 2023-02-18 NOTE — Progress Notes (Signed)
RT assisted with patient transport from ICU to CT and back with no complications. Pt transported on Servo-Air.

## 2023-02-18 NOTE — Code Documentation (Signed)
1478 pulse check, positive carotid pulses, brady, hold compressions

## 2023-02-18 NOTE — Progress Notes (Signed)
PHARMACY CONSULT NOTE - FOLLOW UP  Pharmacy Consult for Electrolyte Monitoring and Replacement   Recent Labs: Potassium (mmol/L)  Date Value  01/27/2023 6.2 (H)  06/21/2013 4.1   Magnesium (mg/dL)  Date Value  47/42/5956 2.1   Calcium (mg/dL)  Date Value  38/75/6433 8.4 (L)   Calcium, Total (mg/dL)  Date Value  29/51/8841 7.3 (L)   Albumin (g/dL)  Date Value  66/08/3014 2.8 (L)  02/12/2022 3.9  06/21/2013 3.1 (L)   Phosphorus (mg/dL)  Date Value  04/03/3233 3.9   Sodium (mmol/L)  Date Value  02/16/2023 130 (L)  02/12/2022 130 (L)  06/21/2013 132 (L)    Assessment: 57 y.o. male w/ PMH of  HTN, DM, ESRD s/p renal transplant currently on hemodialysis who presents to the emergency department in cardiac arrest. Patient is now on an insulin infusion  Goal of Therapy:  Potassium 4.0 - 5.1 mmol/L Magnesium 2.0 - 2.4 mg/dL All Other Electrolytes WNL  Plan:  ---10 units IV insulin aspart  x 1 ---Lokelma 10 grams per tube x 2 ---recheck BMP every 4 hours while on insulin infusion  Lowella Bandy ,PharmD Clinical Pharmacist 02/14/2023 2:24 PM

## 2023-02-18 NOTE — Code Documentation (Signed)
2440 epi 1mg  adm

## 2023-02-18 NOTE — Procedures (Signed)
Patient Name: James Moreno  MRN: [id number]  Epilepsy Attending: Charlsie Quest  Referring Physician/Provider: Judithe Modest, NP  Date: 01/25/2023 Duration: 25.50 mins  Patient history: 57 year old male status post cardiac arrest.  EEG evaluate for seizure.  Level of alertness: comatose  AEDs during EEG study: LEV, versed  Technical aspects: This EEG study was done with scalp electrodes positioned according to the 10-20 International system of electrode placement. Electrical activity was reviewed with band pass filter of 1-70Hz , sensitivity of 7 uV/mm, display speed of 71mm/sec with a 60Hz  notched filter applied as appropriate. EEG data were recorded continuously and digitally stored.  Video monitoring was available and reviewed as appropriate.  Description: Patient was noted to have episodes of brief sudden eye opening with whole body jerking every few seconds.  Concomitant EEG showed generalized polyspikes consistent with myoclonic seizures.  In between seizures EEG showed generalized background suppression. Hyperventilation and photic stimulation were not performed.     ABNORMALITY -Myoclonic seizure, generalized -Background suppression, generalized  IMPRESSION: Patient was noted to have myoclonic seizures every few seconds.  Additionally there was evidence of severe to profound diffuse encephalopathy.  In the setting of cardiac arrest, this EEG pattern is concerning for anoxic/hypoxic brain injury.  Harlon Ditty, NP was notified.  Quentez Lober Annabelle Harman

## 2023-02-18 NOTE — H&P (Signed)
NAME:  James Moreno, MRN:  [id number], DOB:  [birth year], LOS: 0 ADMISSION DATE:  02/23/2023, CONSULTATION DATE:  02/19/2023 REFERRING MD:  Dr. Darrold Junker, CHIEF COMPLAINT:  STEMI, Out-of-Hospital Cardiac Arrest   Brief Pt Description / Synopsis:  57 y.o. male with PMHx significant for ESRD on HD admitted with Out-of-Hospital Cardiac Arrest (estimated total time of ACLS about 28 minutes) due to STEMI and cocaine intoxication.  Underwent emergent Cardiac Cath with drug eluting stent to ostial LAD.  Now exhibiting Myoclonus with concern for anoxic brain injury.  History of Present Illness:  James Moreno is a 57 y.o. male with past medical history significant for hypertension, diabetes, end stage renal disease status post renal transplant currently on hemodialysis who presented to [facility] ED with EMS in cardiac arrest.  EMS reports they were called out for shortness of breath.  They report that he seemed confused, gray in appearance on their arrival.  Patient sats were in the 70s on room air.  His spouse reports he did have dialysis on Saturday.  EMS obtained an EKG that showed anterior lateral ST elevation MI.  Code STEMI activated by EMS at 5:59 AM. He received full dose aspirin and 1 nitroglycerin to EMS.  Patient then became bradycardic in the 30s.  He then lost pulses and was in asystole.  CPR started at 6:09 AM.  EMS reports they continued resuscitative measures for about 15 minutes.  He received 2 rounds of epinephrine with EMS.  Patient being ventilated by BVM on arrival and receiving active chest compressions.  He was intubated by ED Provider.  ROSC obtain briefly, then he again became bradycardic, given 1 mg of Atropine, but progressed to PEA requiring another round of ACLS and CPR again.  Cardiology at bedside upon patient's arrival with plans to take him for emergent cardiac cath.  ED Course: Initial Vital Signs: RR 16 (via BVM), Pulse 87, BP 104/39, SpO2 95% via 100% FiO2 via BVM Significant  Labs: Sodium 128, potassium 3.4, chloride 93, glucose 621, BUN 39, creatinine 4.36, bicarb 23, anion gap 12, BNP 1572, high-sensitivity troponin 382, lactic acid 5.3, WBC 11.6, hemoglobin 10.3, hematocrit 31.1, beta hydroxybutyric acid 0.39 ABG postintubation: pH 7.34/pCO2 46/pO2 358/bicarb 25.6 Urine drug screen positive for cocaine Imaging Chest X-ray>>IMPRESSION: 1. ETT tip at the level the clavicles. Enteric tube courses to the abdomen, tip not included. Right IJ dialysis type catheter tips at the lower SVC level. Left IJ single lumen catheter terminates at the confluence of the innominate veins. 2. No pneumothorax on this supine view. Coarse and confluent bilateral perihilar and interstitial opacity is nonspecific. Top differential considerations include acute pulmonary edema, bilateral pneumonia. Medications Administered: Epinephrine and Levophed infusions  Cardiology evaluated the patient at bedside, and he was taken for emergent cardiac catheterization. Cath revealed revealed 99% stenosis ostial LAD, and chronically appearing 99% stenosis OM 2 with bridging collaterals. The patient underwent primary PCI receiving 3.0 x 8 mm Onyx frontier drug-eluting stent ostial LAD. Left ventriculography revealed fairly reduced left ventricular function with estimated LV ejection fraction of 25%.   Post cath he is being admitted to ICU.  Please see "Significant Hospital Events" section below for full detailed hospital course.   Pertinent  Medical History   Past Medical History:  Diagnosis Date   Acute kidney failure, unspecified (HCC)    Anxiety    Diabetes mellitus, type 2 (HCC)    GERD (gastroesophageal reflux disease)    History of arterial disease of lower extremity  History of hepatitis B    Hypertension    Hypothyroidism    Mitral valve disorders(424.0)    Pityriasis 12/27/2014   Primary pulmonary HTN (HCC)    Pt denies ever having.   Tricuspid valve disorders, specified as  nonrheumatic     Micro Data:  11/25: Urine>> 11/25: Blood cultures x2>>  Antimicrobials:   Anti-infectives (From admission, onward)    Start     Dose/Rate Route Frequency Ordered Stop   02/04/2023 1230  cefTRIAXone (ROCEPHIN) 1 g in sodium chloride 0.9 % 100 mL IVPB        1 g 200 mL/hr over 30 Minutes Intravenous Every 24 hours 01/28/2023 1131         Significant Hospital Events: Including procedures, antibiotic start and stop dates in addition to other pertinent events   11/25: Out-of-Hospital Cardiac arrest requiring about 28 minutes of ACLS.  Underwent emergent Cath, stent placed to ostial LAD.  Upon arrival to ICU exhibiting myoclonus concerning for anoxic brain injury.  Neurology and Nephrology consulted.  Interim History / Subjective:  -Pt seen upon arrival to ICU post cath -Noted to have myoclonus and rhythmic fasciculations of the face ~ giving versed, start Keppra, obtain EEG and CT Head, consult Neurology -Critically ill, on Epi and Levophed infusions ~ will place emergent central line and arterial line  Objective   Blood pressure (!) 104/39, pulse 87, resp. rate 16, height 5' 10.98" (1.803 m), SpO2 96%.    Vent Mode: PRVC FiO2 (%):  [100 %] 100 % Set Rate:  [20 bmp] 20 bmp Vt Set:  [500 mL] 500 mL PEEP:  [10 cmH20] 10 cmH20  No intake or output data in the 24 hours ending 02/11/2023 0846 There were no vitals filed for this visit.  Examination: General: Critically ill-appearing male, laying in bed, intubated, with myoclonus, no acute distress HENT: Atraumatic, normocephalic, neck supple, no JVD, orally intubated Lungs: Coarse breath sounds throughout, even, synchronous with the ventilator Cardiovascular: Regular rate and rhythm, S1-S2, no murmurs, rubs, gallops Abdomen: Soft, nontender, nondistended, no guarding or rebound tenderness, bowel sounds positive x 4 Extremities: Normal bulk and tone, no deformities, no edema Neuro: Unresponsive, with active myoclonus and  rhythmic facial fasciculations GU: Foley catheter being placed  Resolved Hospital Problem list     Assessment & Plan:   #Shock: Cardiogenic  #Out-of-Hospital Cardiac Arrest #STEMI #Acute Decompensated HFrEF PMHx: Hypertension S/p Cardiac Cath 11/25: Successful PCI with DES to ostial LAD, Residual 99% stenosis of OM2 which appeared chronic and collateralized, severe dilated cardiomyopathy with estimated LV 25% -Continuous cardiac monitoring -Maintain MAP >65 -Cautious IV fluids -Vasopressors as needed to maintain MAP goal -Trend lactic acid until normalized -Trend HS Troponin until peaked -Echocardiogram pending -Cardiology following, appreciate input -Volume removal with HD -Continue ASA and Brilinta  #Acute Hypoxic Respiratory Failure due to STEMI and Acute Decompensated HFrEF -Full vent support, implement lung protective strategies -Plateau pressures less than 30 cm H20 -Wean FiO2 & PEEP as tolerated to maintain O2 sats >92% -Follow intermittent Chest X-ray & ABG as needed -Spontaneous Breathing Trials when respiratory parameters met and mental status permits -Implement VAP Bundle -Prn Bronchodilators -Volume removal with HD  #ESRD on Hemodialysis #Hyponatremia #Hyperkalemia PMHx: S/p Renal transplant -Monitor I&O's / urinary output -Follow BMP -Ensure adequate renal perfusion -Avoid nephrotoxic agents as able -Replace electrolytes as indicated ~ Pharmacy following for assistance with electrolyte replacement -Consult Nephrology, appreciate input -Renal replacement therapy as per Nephrology -Will give temporizing measures for now until  HD can be initiated (insulin + Lokelma) -Hold Cellcept for now  #Mild Leukocytosis #Questionable UTI -Monitor fever curve -Trend WBC's & Procalcitonin -Follow cultures as above -Continue empiric Ceftriaxone pending cultures & sensitivities  #Diabetes Mellitus Type II #Hypothyroidism -CBG's q4h; Target range of 140 to  180 -Insulin gtt -Follow ICU Hypo/Hyperglycemia protocol -Check beta-hydroxybutyric acid to rule out DKA -Check TSH and thyroid panel  #Acute Metabolic Encephalopathy #Myoclonus, concern for anoxic brain injury #Cocaine intoxication #Sedation needs in setting of mechanical ventilation -Maintain a RASS goal of 0 to -1 -Fentanyl, Versed, and Propofol as needed to maintain RASS goal and to terminate myoclonus -Avoid sedating medications as able -Daily wake up assessment -Keppra load and maintenance dose -STAT EEG -Obtain CT Head when hemodynamically stable enough to travel off unit -Consult Neurology, appreciate input ~ may ultimately require transfer for continuous EEG     Patient is critically ill with multiorgan failure status post out-of-hospital cardiac arrest and STEMI.  Prognosis is extremely guarded with high risk for further decompensation, cardiac arrest and death.  Patient also exhibiting myoclonus concerning for anoxic brain injury.  Given current critical illness superimposed on multiple chronic comorbidities, overall long-term prognosis is extremely poor.  Recommend DNR/DNI status.  Consider palliative care consult to assist with goals of care conversations.    Best Practice (right click and "Reselect all SmartList Selections" daily)   Diet/type: NPO DVT prophylaxis: prophylactic heparin  GI prophylaxis: PPI Lines: Central line, Arterial Line, and yes and it is still needed Foley:  Yes, and it is still needed Code Status:  full code Last date of multidisciplinary goals of care discussion [11/25]  11/25: Pt's fiance', pt's daughter and her fiance' updated in conference room on plan of care.  Labs   CBC: Recent Labs  Lab 01/30/2023 0645 02/06/2023 0810  WBC 11.6*  --   HGB 9.3* 10.9*  HCT 31.1* 32.0*  MCV 101.3*  --   PLT 181  --     Basic Metabolic Panel: Recent Labs  Lab 02/19/2023 0645 02/08/2023 0810  NA 128* 130*  K 3.4* 5.1  CL 93*  --   CO2 23  --    GLUCOSE 621*  --   BUN 39*  --   CREATININE 4.36*  --   CALCIUM 9.7  --   MG 2.1  --    GFR: Estimated Creatinine Clearance: 21.5 mL/min (A) (by C-G formula based on SCr of 4.36 mg/dL (H)). Recent Labs  Lab 02/11/2023 0645  WBC 11.6*  LATICACIDVEN 5.3*    Liver Function Tests: No results for input(s): "AST", "ALT", "ALKPHOS", "BILITOT", "PROT", "ALBUMIN" in the last 168 hours. No results for input(s): "LIPASE", "AMYLASE" in the last 168 hours. No results for input(s): "AMMONIA" in the last 168 hours.  ABG    Component Value Date/Time   PHART 7.349 (L) 02/14/2023 0810   PCO2ART 46.4 02/03/2023 0810   PO2ART 358 (H) 02/03/2023 0810   HCO3 25.6 02/19/2023 0810   TCO2 27 02/23/2023 0810   O2SAT 100 02/19/2023 0810     Coagulation Profile: No results for input(s): "INR", "PROTIME" in the last 168 hours.  Cardiac Enzymes: No results for input(s): "CKTOTAL", "CKMB", "CKMBINDEX", "TROPONINI" in the last 168 hours.  HbA1C: Hemoglobin A1C  Date/Time Value Ref Range Status  08/08/2022 12:00 AM 7.5  Final    Comment:    O'Connell  11/28/2021 12:00 AM 6.9  Final    CBG: Recent Labs  Lab 02/14/2023 0627  GLUCAP 224*  Review of Systems:   Unable to assess due to intubation/sedation/critical illness   Past Medical History:  He,  has a past medical history of Acute kidney failure, unspecified (HCC), Anxiety, Diabetes mellitus, type 2 (HCC), GERD (gastroesophageal reflux disease), History of arterial disease of lower extremity, History of hepatitis B, Hypertension, Hypothyroidism, Mitral valve disorders(424.0), Pityriasis (12/27/2014), Primary pulmonary HTN (HCC), and Tricuspid valve disorders, specified as nonrheumatic.   Surgical History:   Past Surgical History:  Procedure Laterality Date   APPENDECTOMY     Carotid Doppler Ultrasound  06/14/2009   39% stenosis of bilateral internal carotid artery, bilateral anterograde vertebral flow   DIALYSIS/PERMA CATHETER  INSERTION N/A 02/04/2023   Procedure: DIALYSIS/PERMA CATHETER INSERTION;  Surgeon: Annice Needy, MD;  Location: [facility];  Service: Cardiovascular;  Laterality: N/A;   ESOPHAGOGASTRODUODENOSCOPY (EGD) WITH PROPOFOL N/A 01/21/2019   Procedure: ESOPHAGOGASTRODUODENOSCOPY (EGD) WITH PROPOFOL;  Surgeon: Toney Reil, MD;  Location: [facility];  Service: Endoscopy;  Laterality: N/A;  Diabetic - insulin   EYE SURGERY     right   HEMORROIDECTOMY     KIDNEY TRANSPLANT Right 2016   MECKEL DIVERTICULUM EXCISION     infancy   Myocardial Perfusion scan  01/17/2009   [facility], non- ischemic. LVEF= 55%   PARS PLANA VITRECTOMY Left 02/09/2015   Procedure: Pan retinal photocoagulation 91478;  Surgeon: Marcelene Butte, MD;  Location: [facility] OR[facility];  Service: Ophthalmology;  Laterality: Left;   REFRACTIVE SURGERY Left    sleep study  01/09/2011   Severe sleep apnea. AHI 72.9/hr. RDI=83.0/hr. Desaturation to 69.0% Emergency CPAP titaration to 14.0cm (01/14/19 resolved after wt loss after kidney transplant.)     Social History:   reports that he has been smoking cigarettes. He has a 41 pack-year smoking history. He has never used smokeless tobacco. He reports that he does not currently use alcohol. He reports that he does not use drugs.   Family History:  His family history includes Hyperlipidemia in his mother; Hypertension in his mother; Melanoma in his father.   Allergies Allergies  Allergen Reactions   No Known Allergies      Home Medications  Prior to Admission medications   Medication Sig Start Date End Date Taking? Authorizing Provider  alprazolam Prudy Feeler) 2 MG tablet TAKE 1 TABLET BY MOUTH THREE TIMES DAILY AS NEEDED FOR ANXIETY 01/21/23   [name], MD  amLODipine (NORVASC) 10 MG tablet Take 10 mg by mouth daily.  01/02/19   [provider]  atorvastatin (LIPITOR) 80 MG tablet Take 1 tablet (80 mg total) by mouth daily. 10/18/22   [name], MD  BD INSULIN SYRINGE U/F 31G X 5/16" 1 ML MISC  12/29/18   [provider]  calcitRIOL (ROCALTROL) 0.25 MCG capsule Take 1 capsule (0.25 mcg total) by mouth daily. 04/11/21   [name], MD  carvedilol (COREG) 12.5 MG tablet Take 1.5 tablets (18.75 mg total) by mouth 2 (two) times daily with a meal. 01/21/23   Gollan, Tollie Pizza, MD  chlorhexidine (PERIDEX) 0.12 % solution SMARTSIG:0.5 Capful(s) By Mouth Twice Daily Patient not taking: Reported on 02/04/2023 12/25/22   [provider]  cinacalcet (SENSIPAR) 30 MG tablet Take 30 mg by mouth daily. 11/22/22   [provider]  [name] 10 MG TABS tablet TAKE 1 TABLET BY MOUTH BEFORE BREAKFAST 10/23/22   [name], MD  insulin aspart (NOVOLOG) 100 UNIT/ML injection Inject 8 Units into the skin 3 (three) times  daily before meals. and reports adjusting per sliding scale from Endocrinologist if blood sugar greater than 120 prior to mea    [provider]  insulin degludec (TRESIBA FLEXTOUCH) 100 UNIT/ML FlexTouch Pen Inject 39 Units into the skin daily. Patient receives via [name] Patient Assistance through Dec 2023 Patient taking differently: Inject 18 Units into the skin daily. Patient receives via [name] Patient Assistance through Dec 2023 07/17/22   [name], MD  losartan (COZAAR) 100 MG tablet Take 1 tablet (100 mg total) by mouth at bedtime. 04/11/21   [name], MD  mycophenolate (CELLCEPT) 500 MG tablet Take 1,000 mg by mouth 2 (two) times daily.    [provider]  naloxone [facility]) nasal spray 4 mg/0.1 mL Place 1 spray into the nose once.    [provider]  omeprazole (PRILOSEC) 20 MG capsule Take 20 mg by mouth daily as needed.    [provider]  oxyCODONE (OXY IR/ROXICODONE) 5 MG immediate release tablet Take 1-2 tablets (5-10 mg total) by mouth every 6 (six) hours as needed for severe pain (pain score 7-10). 02/05/23   [name], MD   Semaglutide,0.25 or 0.5MG /DOS, (OZEMPIC, 0.25 OR 0.5 MG/DOSE,) 2 MG/3ML SOPN Inject 0.5 mg into the skin once a week. Patient receives via [name] Patient Assistance Patient taking differently: Inject 1 mg into the skin once a week. Patient receives via [name] Patient Assistance 05/28/22   [name], MD  sildenafil (REVATIO) 20 MG tablet  05/26/21   [provider]  sildenafil (VIAGRA) 100 MG tablet Take 1 tab 1 hour prior to intercourse 10/31/22   Stoioff, Verna Czech, MD  tacrolimus (PROGRAF) 1 MG capsule Take 3 mg by mouth 2 (two) times daily.  01/11/15   [provider]  tamsulosin (FLOMAX) 0.4 MG CAPS capsule Take 1 capsule (0.4 mg total) by mouth daily. 10/18/22   [name], MD  tretinoin (RETIN-A) 0.05 % cream Apply topically as needed.     [provider]  triamcinolone cream (KENALOG) 0.1 % APPLY DAILY TO INFLAMED BUMPS AS NEEDED 10/06/18   [provider]  XYOSTED 50 MG/0.5ML SOAJ  05/26/21   [provider]     Critical care time: 65 minutes     Harlon Ditty, AGACNP-BC [facility] Prefer epic messenger for cross cover needs If after hours, please call E-link

## 2023-02-18 NOTE — Code Documentation (Signed)
All one-stop meds ordered per MD present during code see free text

## 2023-02-19 ENCOUNTER — Inpatient Hospital Stay: Payer: Medicare HMO

## 2023-02-19 ENCOUNTER — Ambulatory Visit: Payer: Medicare HMO

## 2023-02-19 DIAGNOSIS — I2102 ST elevation (STEMI) myocardial infarction involving left anterior descending coronary artery: Secondary | ICD-10-CM | POA: Diagnosis not present

## 2023-02-19 DIAGNOSIS — R570 Cardiogenic shock: Secondary | ICD-10-CM | POA: Diagnosis not present

## 2023-02-19 DIAGNOSIS — I469 Cardiac arrest, cause unspecified: Principal | ICD-10-CM

## 2023-02-19 DIAGNOSIS — G9341 Metabolic encephalopathy: Secondary | ICD-10-CM | POA: Diagnosis not present

## 2023-02-19 DIAGNOSIS — G931 Anoxic brain damage, not elsewhere classified: Secondary | ICD-10-CM | POA: Diagnosis not present

## 2023-02-19 DIAGNOSIS — R569 Unspecified convulsions: Secondary | ICD-10-CM | POA: Diagnosis not present

## 2023-02-19 LAB — CBC
HCT: 27.6 % — ABNORMAL LOW (ref 39.0–52.0)
Hemoglobin: 9.2 g/dL — ABNORMAL LOW (ref 13.0–17.0)
MCH: 30.6 pg (ref 26.0–34.0)
MCHC: 33.3 g/dL (ref 30.0–36.0)
MCV: 91.7 fL (ref 80.0–100.0)
Platelets: 177 10*3/uL (ref 150–400)
RBC: 3.01 MIL/uL — ABNORMAL LOW (ref 4.22–5.81)
RDW: 14.7 % (ref 11.5–15.5)
WBC: 19.6 10*3/uL — ABNORMAL HIGH (ref 4.0–10.5)
nRBC: 0 % (ref 0.0–0.2)

## 2023-02-19 LAB — URINE CULTURE: Culture: NO GROWTH

## 2023-02-19 LAB — HEPATIC FUNCTION PANEL
ALT: 64 U/L — ABNORMAL HIGH (ref 0–44)
AST: 35 U/L (ref 15–41)
Albumin: 2.6 g/dL — ABNORMAL LOW (ref 3.5–5.0)
Alkaline Phosphatase: 94 U/L (ref 38–126)
Bilirubin, Direct: <0.1 mg/dL (ref 0.0–0.2)
Total Bilirubin: 0.6 mg/dL (ref <1.2)
Total Protein: 4.8 g/dL — ABNORMAL LOW (ref 6.5–8.1)

## 2023-02-19 LAB — MAGNESIUM: Magnesium: 1.6 mg/dL — ABNORMAL LOW (ref 1.7–2.4)

## 2023-02-19 LAB — GLUCOSE, CAPILLARY
Glucose-Capillary: 149 mg/dL — ABNORMAL HIGH (ref 70–99)
Glucose-Capillary: 160 mg/dL — ABNORMAL HIGH (ref 70–99)
Glucose-Capillary: 165 mg/dL — ABNORMAL HIGH (ref 70–99)
Glucose-Capillary: 167 mg/dL — ABNORMAL HIGH (ref 70–99)
Glucose-Capillary: 158 mg/dL — ABNORMAL HIGH (ref 70–99)
Glucose-Capillary: 142 mg/dL — ABNORMAL HIGH (ref 70–99)
Glucose-Capillary: 196 mg/dL — ABNORMAL HIGH (ref 70–99)

## 2023-02-19 LAB — BASIC METABOLIC PANEL
Anion gap: 12 (ref 5–15)
BUN: 35 mg/dL — ABNORMAL HIGH (ref 6–20)
CO2: 23 mmol/L (ref 22–32)
Calcium: 8 mg/dL — ABNORMAL LOW (ref 8.9–10.3)
Chloride: 96 mmol/L — ABNORMAL LOW (ref 98–111)
Creatinine, Ser: 3.52 mg/dL — ABNORMAL HIGH (ref 0.61–1.24)
GFR, Estimated: 19 mL/min — ABNORMAL LOW (ref >60)
Glucose, Bld: 150 mg/dL — ABNORMAL HIGH (ref 70–99)
Potassium: 4.1 mmol/L (ref 3.5–5.1)
Sodium: 131 mmol/L — ABNORMAL LOW (ref 135–145)

## 2023-02-19 LAB — BLOOD GAS, ARTERIAL
Acid-base deficit: 3.1 mmol/L — ABNORMAL HIGH (ref 0.0–2.0)
Bicarbonate: 22 mmol/L (ref 20.0–28.0)
FIO2: 50 %
MECHVT: 500 mL
Mechanical Rate: 20
O2 Saturation: 99.5 %
PEEP: 10 cmH2O
Patient temperature: 37
pCO2 arterial: 39 mm[Hg] (ref 32–48)
pH, Arterial: 7.36 (ref 7.35–7.45)
pO2, Arterial: 159 mm[Hg] — ABNORMAL HIGH (ref 83–108)

## 2023-02-19 LAB — RENAL FUNCTION PANEL
Albumin: 2.7 g/dL — ABNORMAL LOW (ref 3.5–5.0)
Anion gap: 12 (ref 5–15)
BUN: 36 mg/dL — ABNORMAL HIGH (ref 6–20)
CO2: 23 mmol/L (ref 22–32)
Calcium: 8.1 mg/dL — ABNORMAL LOW (ref 8.9–10.3)
Chloride: 96 mmol/L — ABNORMAL LOW (ref 98–111)
Creatinine, Ser: 3.63 mg/dL — ABNORMAL HIGH (ref 0.61–1.24)
GFR, Estimated: 19 mL/min — ABNORMAL LOW (ref >60)
Glucose, Bld: 161 mg/dL — ABNORMAL HIGH (ref 70–99)
Phosphorus: 5.7 mg/dL — ABNORMAL HIGH (ref 2.5–4.6)
Potassium: 4.3 mmol/L (ref 3.5–5.1)
Sodium: 131 mmol/L — ABNORMAL LOW (ref 135–145)

## 2023-02-19 LAB — TRIGLYCERIDES: Triglycerides: 189 mg/dL — ABNORMAL HIGH (ref <150)

## 2023-02-19 LAB — THYROID PANEL WITH TSH
Free Thyroxine Index: 2.3 (ref 1.2–4.9)
T3 Uptake Ratio: 37 % (ref 24–39)
T4, Total: 6.1 ug/dL (ref 4.5–12.0)
TSH: 1.29 u[IU]/mL (ref 0.450–4.500)

## 2023-02-19 MED ORDER — HEPARIN SODIUM (PORCINE) 1000 UNIT/ML IJ SOLN
INTRAMUSCULAR | Status: AC
  Filled 2023-02-19: qty 10

## 2023-02-19 MED ORDER — PROSOURCE TF20 ENFIT COMPATIBL EN LIQD
60.0000 mL | Freq: Two times a day (BID) | ENTERAL | Status: DC
  Administered 2023-02-20 – 2023-02-21 (×3): 60 mL
  Filled 2023-02-19: qty 60

## 2023-02-19 MED ORDER — FREE WATER
30.0000 mL | Status: DC
  Administered 2023-02-19 – 2023-02-21 (×12): 30 mL

## 2023-02-19 MED ORDER — SODIUM CHLORIDE 0.9 % IV SOLN
1.0000 g | INTRAVENOUS | Status: AC
  Administered 2023-02-19 – 2023-02-20 (×2): 1 g via INTRAVENOUS
  Filled 2023-02-19 (×2): qty 10

## 2023-02-19 MED ORDER — INSULIN ASPART 100 UNIT/ML IJ SOLN
0.0000 [IU] | INTRAMUSCULAR | Status: DC
  Administered 2023-02-19 – 2023-02-20 (×6): 1 [IU] via SUBCUTANEOUS
  Administered 2023-02-20: 2 [IU] via SUBCUTANEOUS
  Administered 2023-02-20: 3 [IU] via SUBCUTANEOUS
  Administered 2023-02-20 (×2): 1 [IU] via SUBCUTANEOUS
  Administered 2023-02-20: 2 [IU] via SUBCUTANEOUS
  Filled 2023-02-19 (×11): qty 1

## 2023-02-19 MED ORDER — MAGNESIUM SULFATE 2 GM/50ML IV SOLN
2.0000 g | Freq: Once | INTRAVENOUS | Status: DC
  Filled 2023-02-19: qty 50

## 2023-02-19 MED ORDER — MAGNESIUM SULFATE IN D5W 1-5 GM/100ML-% IV SOLN
1.0000 g | Freq: Once | INTRAVENOUS | Status: AC
  Administered 2023-02-19: 1 g via INTRAVENOUS
  Filled 2023-02-19: qty 100

## 2023-02-19 MED ORDER — HEPARIN SODIUM (PORCINE) 5000 UNIT/ML IJ SOLN
5000.0000 [IU] | Freq: Three times a day (TID) | INTRAMUSCULAR | Status: DC
  Administered 2023-02-19 – 2023-02-21 (×7): 5000 [IU] via SUBCUTANEOUS
  Filled 2023-02-19 (×7): qty 1

## 2023-02-19 MED ORDER — VITAL 1.5 CAL PO LIQD
1000.0000 mL | ORAL | Status: DC
  Administered 2023-02-19 – 2023-02-21 (×2): 1000 mL

## 2023-02-19 NOTE — Progress Notes (Signed)
[facility], [place] 02/19/23  Subjective:   LOS: 1  Patient known to our practice from outpatient follow-up for CKD.  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  Patient arrived to emergency room via EMS in cardiac arrest.  He had called out for shortness of breath, confusion and pallor. EMS notes indicate low oxygen saturation upon their arrival.  His last dialysis was Saturday.  EKG obtained by EMS showed anterior lateral STEMI.  He lost pulse and became asystolic.  Resuscitative measures were provided for about 15 minutes.  Patient was intubated in the emergency room. Nephrology consult has been requested for evaluation as patient's potassium level was elevated at 6.2 this morning. When seen, patient had myoclonic jerks concerning for anoxic brain injury.  He was evaluated by neurologist.  Patient remains critically ill, ventilator dependent Significant neurological concern of anoxic brain injury. Currently sedated with propofol, fentanyl, Versed. Requiring Levophed for hemodynamic support  Objective:  Vital signs in last 24 hours:  Temp:  [98.1 F (36.7 C)-99.2 F (37.3 C)] 98.6 F (37 C) (11/26 1148) Pulse Rate:  [74-81] 75 (11/26 1800) Resp:  [15-26] 20 (11/26 1800) BP: (101-138)/(56-72) 115/60 (11/26 1800) SpO2:  [96 %-100 %] 98 % (11/26 1800) Arterial Line BP: (109-155)/(44-61) 130/50 (11/26 1800) FiO2 (%):  [40 %-50 %] 40 % (11/26 1618) Weight:  [100.6 kg] 100.6 kg (11/26 0500)  Weight change:  Filed Weights   02/11/2023 1300 02/19/23 0500  Weight: 99.3 kg 100.6 kg    Intake/Output:    Intake/Output Summary (Last 24 hours) at 02/19/2023 2007 Last data filed at 02/19/2023 1800 Gross per 24 hour  Intake 1518.49 ml  Output  815 ml  Net 703.49 ml     Physical Exam: General: Critically ill-appearing  HEENT ET tube in place  Pulm/lungs Ventilator assisted.    CVS/Heart Tachycardic, irregular  Abdomen:  Soft, nontender, nondistended  Extremities: Trace edema  Neurologic: Sedated  Skin: Warm  Access: Right IJ tunneled dialysis catheter       Basic Metabolic Panel:  Recent Labs  Lab 02/02/2023 0645 02/16/2023 0810 02/04/2023 1442 02/22/2023 2013 01/31/2023 2244 02/19/23 0217 02/19/23 0358  NA 128*   < > 131* 134* 133* 131* 131*  K 3.4*   < > 5.4* 3.7 3.6 4.1 4.3  CL 93*   < > 99 98 97* 96* 96*  CO2 23   < > 24 26 26 23 23   GLUCOSE 621*   < > 300* 135* 128* 150* 161*  BUN 39*   < > 52* 35* 32* 35* 36*  CREATININE 4.36*   < > 4.55* 2.95* 2.98* 3.52* 3.63*  CALCIUM 9.7   < > 7.7* 8.1* 8.0* 8.0* 8.1*  MG 2.1  --   --   --   --   --  1.6*  PHOS  --   --   --   --   --   --  5.7*   < > = values in this interval not displayed.     CBC: Recent Labs  Lab 02/13/2023 0645 02/02/2023 0810 02/12/2023 1152 02/19/23 0358  WBC 11.6*  --  19.8* 19.6*  HGB 9.3* 10.9* 10.1* 9.2*  HCT 31.1* 32.0* 31.4* 27.6*  MCV 101.3*  --  94.3 91.7  PLT 181  --  224 177      Lab Results  Component Value Date   HEPBSAG NON REACTIVE 01/27/2023      Microbiology:  Recent Results (from the past 240 hour(s))  Urine Culture     Status: None   Collection Time: 01/31/2023 10:51 AM   Specimen: Urine, Random  Result Value Ref Range Status   Specimen Description   Final    URINE, RANDOM Performed at [facility], [address], [place], [place] [address]    Special Requests   Final    NONE Reflexed from [id number] Performed at [facility], [address] [place], [place] [address]    Culture   Final    NO GROWTH Performed at [facility], [address] [place], [place] [address]    Report Status 02/19/2023 FINAL  Final  MRSA Next Gen by PCR, Nasal     Status: None   Collection Time: 02/01/2023  10:59 AM   Specimen: Nasal Mucosa; Nasal Swab  Result Value Ref Range Status   MRSA by PCR Next Gen NOT DETECTED NOT DETECTED Final    Comment: (NOTE) The GeneXpert MRSA Assay (FDA approved for NASAL specimens only), is one component of a comprehensive MRSA colonization surveillance program. It is not intended to diagnose MRSA infection nor to guide or monitor treatment for MRSA infections. Test performance is not FDA approved in patients less than 73 years old. Performed at [facility], [address] [place], [place] [address]   Culture, blood (Routine X 2) w Reflex to ID Panel     Status: None (Preliminary result)   Collection Time: 02/09/2023  1:12 PM   Specimen: BLOOD RIGHT HAND  Result Value Ref Range Status   Specimen Description BLOOD RIGHT HAND  Final   Special Requests   Final    Blood Culture results may not be optimal due to an excessive volume of blood received in culture bottles   Culture   Final    NO GROWTH < 24 HOURS Performed at [facility], [address] [place], [place] [address]    Report Status PENDING  Incomplete  Culture, blood (Routine X 2) w Reflex to ID Panel     Status: None (Preliminary result)   Collection Time: 02/10/2023  1:22 PM   Specimen: A-Line; Blood  Result Value Ref Range Status   Specimen Description A-LINE  Final   Special Requests BCAV  Final   Culture   Final    NO GROWTH < 24 HOURS Performed at [facility], [address], [place], [place] [address]    Report Status PENDING  Incomplete    Coagulation Studies: No results for input(s): "LABPROT", "INR" in the last 72 hours.  Urinalysis: Recent Labs    02/10/2023 1051  COLORURINE YELLOW*  YELLOW*  LABSPEC 1.026  1.026  PHURINE 5.0  6.0  GLUCOSEU >=500*  >=500*  HGBUR MODERATE*  MODERATE*  BILIRUBINUR NEGATIVE  NEGATIVE  KETONESUR NEGATIVE  NEGATIVE  PROTEINUR >=300*  >=300*  NITRITE NEGATIVE  NEGATIVE  LEUKOCYTESUR NEGATIVE  NEGATIVE       Imaging: EEG adult  Result Date: 02/19/2023 Charlsie Quest, MD     02/19/2023  3:57 PM Patient Name: ARNESH BANDT MRN: [id number] Epilepsy Attending: Charlsie Quest Referring Physician/Provider: Caryl Pina, MD Date: 02/19/2023 Duration: 28.20 mins  Patient history: 57 year old male status post cardiac arrest.  EEG evaluate for seizure.  Level of alertness: comatose  AEDs during EEG  study: LEV, versed, propofol  Technical aspects: This EEG study was done with scalp electrodes positioned according to the 10-20 International system of electrode placement. Electrical activity was reviewed with band pass filter of 1-70Hz , sensitivity of 7 uV/mm, display speed of 7mm/sec with a 60Hz  notched filter applied as appropriate. EEG data were recorded continuously and digitally stored.  Video monitoring was available and reviewed as appropriate.  Description: EEG showed burst suppression with burst of 3-7hz  theta-delta slowing lasting 5-10 seconds alternating with 10-15 seconds of generalized background suppression. Hyperventilation and photic stimulation were not performed.    ABNORMALITY - Burst suppression, generalized  IMPRESSION: This study was suggestive of profound diffuse encephalopathy.  No seizures were noted.  Charlsie Quest   DG Chest Port 1 View  Result Date: 02/19/2023 CLINICAL DATA:  Acute respiratory failure, hypoxia EXAM: PORTABLE CHEST 1 VIEW COMPARISON:  01/25/2023 FINDINGS: Two frontal views of the chest demonstrate stable endotracheal tube, enteric catheter, and right internal jugular dialysis catheter. Left internal jugular catheter tip again projects over the brachiocephalic confluence. Cardiac silhouette is stable. There is persistent pulmonary vascular congestion, likely not significantly changed given differences in patient positioning and technique. No effusion or pneumothorax. No acute bony abnormalities. IMPRESSION: 1. Support devices as above. 2. Pulmonary vascular  congestion, with no significant change in volume status. Electronically Signed   By: Sharlet Salina M.D.   On: 02/19/2023 09:04   MR BRAIN WO CONTRAST  Result Date: 02/19/2023 CLINICAL DATA:  Follow-up examination for stroke. EXAM: MRI HEAD WITHOUT CONTRAST TECHNIQUE: Multiplanar, multiecho pulse sequences of the brain and surrounding structures were obtained without intravenous contrast. COMPARISON:  Prior CT from 02/04/2023. FINDINGS: Brain: Cerebral volume within normal limits. Patchy T2/FLAIR hyperintensity involving the periventricular and deep white matter of both cerebral hemispheres as well as the pons, consistent with chronic small vessel ischemic disease. Remote lacunar infarcts present at the left thalamus and pons. Extensive diffusion signal abnormality seen involving the cortical gray matter of both cerebral hemispheres, slightly worse on the right. Symmetric involvement of the bilateral basal ganglia and thalami. Diffusion signal abnormality seen throughout the cerebellum. Associated T2/FLAIR signal abnormality throughout the areas affected. Findings consistent with widespread anoxic brain injury. No definite superimposed vascular infarct. Multiple scattered punctate foci of susceptibility artifact noted, consistent with small chronic micro hemorrhages, likely hypertensive in nature. Few small foci of susceptibility artifact noted within the left frontoparietal region. No visible acute hemorrhage seen at these locations on prior head CT. No mass lesion or midline shift. No hydrocephalus or extra-axial fluid collection. Pituitary gland and suprasellar region within normal limits. Vascular: Major intracranial vascular flow voids are maintained. Skull and upper cervical spine: Craniocervical junction within normal limits. Decreased T1 signal intensity seen within the visualized bone marrow, nonspecific, but most commonly related to anemia, smoking, or obesity. No scalp soft tissue abnormality.  Sinuses/Orbits: Prior ocular lens replacement on the right. Scattered mucosal thickening present throughout the paranasal sinuses. Trace bilateral mastoid effusions. Patient is intubated. Other: None. IMPRESSION: 1. Findings consistent with widespread anoxic brain injury as detailed above. 2. Underlying chronic microvascular ischemic disease with remote lacunar infarcts at the left thalamus and pons. 3. Few small foci of susceptibility artifact involving the left frontoparietal region. While no visible hemorrhage is seen or these locations on prior head CT, that exam was somewhat limited due to the presence of IV contrast material related to prior cardiac catheterization. Correlation with repeat noncontrast head CT suggested to evaluate for possible trace subarachnoid hemorrhage  as warranted. Electronically Signed   By: Rise Mu M.D.   On: 02/19/2023 06:54   CT HEAD WO CONTRAST ( )  Result Date: 01/31/2023 CLINICAL DATA:  Mental status change, unknown cause. EXAM: CT HEAD WITHOUT CONTRAST TECHNIQUE: Contiguous axial images were obtained from the base of the skull through the vertex without intravenous contrast. RADIATION DOSE REDUCTION: This exam was performed according to the departmental dose-optimization program which includes automated exposure control, adjustment of the mA and/or kV according to patient size and/or use of iterative reconstruction technique. COMPARISON:  Head CT February 17, 2019. FINDINGS: Brain: Although no intravenous contrast was administered to obtain this study, the images are enhance by contrast administered during cardiac catheterization performed earlier today. This evaluation for possible subarachnoid hemorrhage. Limits hypodense focus is seen on the left side of the pons, concerning for infarct versus artifact. A hypodense focus is also seen in the left thalamus. No large acute territorial infarct identified. No hydrocephalus, extra-axial collection or mass  lesion/mass effect. Vascular: Calcified plaques in the bilateral carotid siphons. Skull: Normal. Negative for fracture or focal lesion. Sinuses/Orbits: Fluid within the paranasal sinuses and within the nasopharynx and oropharynx related to presence of endotracheal tube. The orbits are maintained. Other: Soft tissue swelling on the right side of the face may be posttraumatic. IMPRESSION: 1. Limited study due to presence of contrast from cardiac catheterization performed earlier today. 2. Hypodense focus in the left side of the pons and left thalamus, concerning for infarct versus artifact. Correlation with MRI of the brain recommended. 3. Soft tissue swelling on the right side of the face may be posttraumatic. Electronically Signed   By: Baldemar Lenis M.D.   On: 02/17/2023 16:28   EEG adult  Result Date: 02/06/2023 Charlsie Quest, MD     02/08/2023  1:40 PM Patient Name: MARKECE PIZZIMENTI MRN: [id number] Epilepsy Attending: Charlsie Quest Referring Physician/Provider: Judithe Modest, NP Date: 02/07/2023 Duration: 25.50 mins Patient history: 57 year old male status post cardiac arrest.  EEG evaluate for seizure. Level of alertness: comatose AEDs during EEG study: LEV, versed Technical aspects: This EEG study was done with scalp electrodes positioned according to the 10-20 International system of electrode placement. Electrical activity was reviewed with band pass filter of 1-70Hz , sensitivity of 7 uV/mm, display speed of 59mm/sec with a 60Hz  notched filter applied as appropriate. EEG data were recorded continuously and digitally stored.  Video monitoring was available and reviewed as appropriate. Description: Patient was noted to have episodes of brief sudden eye opening with whole body jerking every few seconds.  Concomitant EEG showed generalized polyspikes consistent with myoclonic seizures.  In between seizures EEG showed generalized background suppression. Hyperventilation and photic  stimulation were not performed.   ABNORMALITY -Myoclonic seizure, generalized -Background suppression, generalized IMPRESSION: Patient was noted to have myoclonic seizures every few seconds.  Additionally there was evidence of severe to profound diffuse encephalopathy.  In the setting of cardiac arrest, this EEG pattern is concerning for anoxic/hypoxic brain injury. Harlon Ditty, NP was notified. Charlsie Quest   DG Abd 1 View  Result Date: 02/03/2023 CLINICAL DATA:  57 year old male intubated.  Enteric tube placement. EXAM: ABDOMEN - 1 VIEW COMPARISON:  Portable chest reported separately at the same time. CT Abdomen and Pelvis 10/31/2010. FINDINGS: Portable AP supine view at 1140 hours. Enteric tube terminates in the proximal stomach, side hole is at the level of the GEJ. Paucity of bowel gas in the upper abdomen. Stable visible lower chest  as reported separately. IMPRESSION: Enteric tube placed into the stomach but side hole is at the level of the GEJ. Advance 6 cm to ensure side hole placement within the stomach. Electronically Signed   By: Odessa Fleming M.D.   On: 02/15/2023 11:54   DG Chest Port 1 View  Result Date: 02/15/2023 CLINICAL DATA:  57 year old male intubated. EXAM: PORTABLE CHEST 1 VIEW COMPARISON:  Chest radiographs 06/21/2013, chest CT 08/23/2021 and earlier. FINDINGS: Portable AP supine view at 1138 hours. Endotracheal tube tip at the level the clavicles. Enteric tube courses to the abdomen, tip not included. Right IJ central line, dual lumen dialysis type with catheter tips extending to the lower SVC level. Superimposed left IJ approach single lumen vascular catheter, tip projects at the confluence of the innominate veins. Mediastinal contours remain within normal limits. Coarse bilateral perihilar and interstitial opacity, slight upper lobe predominance. No pneumothorax or pleural effusion identified on this supine view. Chronic right clavicle fracture. IMPRESSION: 1. ETT tip at the  level the clavicles. Enteric tube courses to the abdomen, tip not included. Right IJ dialysis type catheter tips at the lower SVC level. Left IJ single lumen catheter terminates at the confluence of the innominate veins. 2. No pneumothorax on this supine view. Coarse and confluent bilateral perihilar and interstitial opacity is nonspecific. Top differential considerations include acute pulmonary edema, bilateral pneumonia. Electronically Signed   By: Odessa Fleming M.D.   On: 01/31/2023 11:53   CARDIAC CATHETERIZATION  Result Date: 01/26/2023   2nd Mrg lesion is 99% stenosed.   1st Mrg lesion is 75% stenosed.   Mid Cx to Dist Cx lesion is 90% stenosed.   Ost LAD to Prox LAD lesion is 99% stenosed.   Mid LAD lesion is 50% stenosed.   Prox RCA lesion is 30% stenosed.   Mid RCA lesion is 20% stenosed.   A drug-eluting stent was successfully placed using a STENT ONYX FRONTIER [id number].   Post intervention, there is a 0% residual stenosis.   There is severe left ventricular systolic dysfunction.   LV end diastolic pressure is mildly elevated.   The left ventricular ejection fraction is 25-35% by visual estimate. 1.  Cardiac arrest 2.  Anterior STEMI 3.  Successful primary PCI with 3.0 x 8 mm Onyx frontier DES ostial LAD 4.  Residual 99% stenosis OM 2 which appears chronic and collateralized 5.  Severe dilated cardiomyopathy with estimated LV ejection fraction 25% 6.  Guarded prognosis Recommendations 1.  Continue IV cangrelor until patient can take oral platelet inhibitor 2.  Good medical management after extubation     Medications:    sodium chloride     sodium chloride     cefTRIAXone (ROCEPHIN)  IV Stopped (02/19/23 1548)   feeding supplement (VITAL 1.5 CAL) 25 mL/hr at 02/19/23 1800   fentaNYL infusion INTRAVENOUS 100 mcg/hr (02/19/23 1800)   levETIRAcetam Stopped (02/19/23 1154)   levETIRAcetam     midazolam 10 mg/hr (02/19/23 1800)   norepinephrine (LEVOPHED) Adult infusion 2 mcg/min (02/19/23 1800)    propofol (DIPRIVAN) infusion 25 mcg/kg/min (02/19/23 1800)    aspirin  81 mg Oral Daily   Chlorhexidine Gluconate Cloth  6 each Topical Q0600   docusate  100 mg Per Tube BID   [START ON 02/20/2023] feeding supplement (PROSource TF20)  60 mL Per Tube BID   free water  30 mL Per Tube Q4H   heparin injection (subcutaneous)  5,000 Units Subcutaneous Q8H   insulin aspart  0-6 Units Subcutaneous  Q4H   mouth rinse  15 mL Mouth Rinse Q2H   pantoprazole (PROTONIX) IV  40 mg Intravenous Daily   polyethylene glycol  17 g Per Tube Daily   sodium chloride flush  3 mL Intravenous Q12H   sodium chloride flush  3 mL Intravenous Q12H   ticagrelor  90 mg Per Tube BID   sodium chloride, sodium chloride, acetaminophen, alteplase, fentaNYL, heparin, levETIRAcetam, midazolam, mouth rinse, sodium chloride flush, sodium chloride flush  Assessment/ Plan:  57 y.o. male with  history of end-stage renal disease ,peritoneal dialysis, history of hepatitis B infection, history of positive PPD treated with INH/rifampin in the past, renal transplantation April 2016 which failed recently-hemodialysis restarted, current smoker was admitted on 01/31/2023 for  Principal Problem:   Acute ST elevation myocardial infarction (STEMI) involving left anterior descending (LAD) coronary artery (HCC)  Acute ST elevation myocardial infarction (STEMI) involving left anterior descending (LAD) coronary artery (HCC) [I21.02]  #. ESRD with hyperkalemia Patient underwent hemodialysis on Monday. Urine tox screen positive for cocaine. Next hemodialysis depending from discussions about palliative care.   #. Anemia of CKD  Lab Results  Component Value Date   HGB 9.2 (L) 02/19/2023   Low dose EPO with HD if hemoglobin is trending lower.  #. Secondary hyperparathyroidism of renal origin N 25.81   No results found for: "PTH" Lab Results  Component Value Date   PHOS 5.7 (H) 02/19/2023   Monitor calcium and phos level during this  admission   #. Diabetes type 2 with CKD Hemoglobin A1C (no units)  Date Value  08/08/2022 7.5   Hgb A1c MFr Bld (%)  Date Value  02/08/2023 6.7 (H)  Currently managed with NovoLog.  #STEMI Complicated by cardiac arrest, 15 minutes of ACLS, acute respiratory failure requiring ventilator support, possible anoxic brain injury. -Patient is status post emergent cardiac catheterization showing severe ostial LAD stenosis and subtotal occlusion of the mid left circumflex, EF of 20 to 25%.  Difficult PCI to ostial LAD.    LOS: 1 Mikaelah Trostle 11/26/20248:07 PM  [facility] [place], [place] [phone]

## 2023-02-19 NOTE — Progress Notes (Signed)
NAME:  NATHANAEL LOTZ, MRN:  [id number], DOB:  09-04-1965, LOS: 1 ADMISSION DATE:  01/27/2023, CONSULTATION DATE:  02/01/2023 REFERRING MD:  Dr. Darrold Junker, CHIEF COMPLAINT:  STEMI, Out-of-Hospital Cardiac Arrest   Brief Pt Description / Synopsis:  57 y.o. male with PMHx significant for ESRD on HD admitted with Out-of-Hospital Cardiac Arrest (estimated total time of ACLS about 28 minutes) due to STEMI and cocaine intoxication.  Underwent emergent Cardiac Cath with drug eluting stent to ostial LAD.  Now exhibiting Myoclonus with concern for anoxic brain injury.  History of Present Illness:  JAYMON LEDWELL is a 57 y.o. male with past medical history significant for hypertension, diabetes, end stage renal disease status post renal transplant currently on hemodialysis who presented to [facility] ED with EMS in cardiac arrest.  EMS reports they were called out for shortness of breath.  They report that he seemed confused, gray in appearance on their arrival.  Patient sats were in the 70s on room air.  His spouse reports he did have dialysis on Saturday.  EMS obtained an EKG that showed anterior lateral ST elevation MI.  Code STEMI activated by EMS at 5:59 AM. He received full dose aspirin and 1 nitroglycerin to EMS.  Patient then became bradycardic in the 30s.  He then lost pulses and was in asystole.  CPR started at 6:09 AM.  EMS reports they continued resuscitative measures for about 15 minutes.  He received 2 rounds of epinephrine with EMS.  Patient being ventilated by BVM on arrival and receiving active chest compressions.  He was intubated by ED Provider.  ROSC obtain briefly, then he again became bradycardic, given 1 mg of Atropine, but progressed to PEA requiring another round of ACLS and CPR again.  Cardiology at bedside upon patient's arrival with plans to take him for emergent cardiac cath.  ED Course: Initial Vital Signs: RR 16 (via BVM), Pulse 87, BP 104/39, SpO2 95% via 100% FiO2 via BVM Significant  Labs: Sodium 128, potassium 3.4, chloride 93, glucose 621, BUN 39, creatinine 4.36, bicarb 23, anion gap 12, BNP 1572, high-sensitivity troponin 382, lactic acid 5.3, WBC 11.6, hemoglobin 10.3, hematocrit 31.1, beta hydroxybutyric acid 0.39 ABG postintubation: pH 7.34/pCO2 46/pO2 358/bicarb 25.6 Urine drug screen positive for cocaine Imaging Chest X-ray>>IMPRESSION: 1. ETT tip at the level the clavicles. Enteric tube courses to the abdomen, tip not included. Right IJ dialysis type catheter tips at the lower SVC level. Left IJ single lumen catheter terminates at the confluence of the innominate veins. 2. No pneumothorax on this supine view. Coarse and confluent bilateral perihilar and interstitial opacity is nonspecific. Top differential considerations include acute pulmonary edema, bilateral pneumonia. Medications Administered: Epinephrine and Levophed infusions  Cardiology evaluated the patient at bedside, and he was taken for emergent cardiac catheterization. Cath revealed revealed 99% stenosis ostial LAD, and chronically appearing 99% stenosis OM 2 with bridging collaterals. The patient underwent primary PCI receiving 3.0 x 8 mm Onyx frontier drug-eluting stent ostial LAD. Left ventriculography revealed fairly reduced left ventricular function with estimated LV ejection fraction of 25%.   Post cath he is being admitted to ICU.  Please see "Significant Hospital Events" section below for full detailed hospital course.   Pertinent  Medical History   Past Medical History:  Diagnosis Date   Acute kidney failure, unspecified (HCC)    Anxiety    Diabetes mellitus, type 2 (HCC)    GERD (gastroesophageal reflux disease)    History of arterial disease of lower extremity  History of hepatitis B    Hypertension    Hypothyroidism    Mitral valve disorders(424.0)    Pityriasis 12/27/2014   Primary pulmonary HTN (HCC)    Pt denies ever having.   Tricuspid valve disorders, specified as  nonrheumatic     Micro Data:  11/25: Urine>> 11/25: Blood cultures x2>>  Antimicrobials:   Anti-infectives (From admission, onward)    Start     Dose/Rate Route Frequency Ordered Stop   02/16/2023 1230  cefTRIAXone (ROCEPHIN) 1 g in sodium chloride 0.9 % 100 mL IVPB        1 g 200 mL/hr over 30 Minutes Intravenous Every 24 hours 01/27/2023 1131         Significant Hospital Events: Including procedures, antibiotic start and stop dates in addition to other pertinent events   11/25: Out-of-Hospital Cardiac arrest requiring about 28 minutes of ACLS.  Underwent emergent Cath, stent placed to ostial LAD.  Upon arrival to ICU exhibiting myoclonus concerning for anoxic brain injury.  Neurology and Nephrology consulted. 11/26: Neuro exam and MRI Brain concerning for anoxic brain injury. Repeat EEG pending.  Consult Palliative Care to assist with goals of care.  Interim History / Subjective:  -No significant events noted overnight -Afebrile, on Levophed at 5 mcg -Vent requirements weaned to 40% FiO2 and 8 PEEP ~ mental status precludes extubation -MRI Brain obtained last night concerning for widespread anoxic brain injury -Neuro exam extremely poor: no current  brainstem reflexes, but pt is heavily sedated on versed, propofol, and fentanyl due to severe myoclonus yesterday ~ repeat EEG is pending  Objective   Blood pressure 120/60, pulse 76, temperature 98.6 F (37 C), temperature source Axillary, resp. rate 20, height 5' 10.98" (1.803 m), weight 100.6 kg, SpO2 100%. CVP:  [8 mmHg-14 mmHg] 9 mmHg  Vent Mode: PRVC FiO2 (%):  [50 %-100 %] 50 % Set Rate:  [20 bmp] 20 bmp Vt Set:  [500 mL] 500 mL PEEP:  [10 cmH20] 10 cmH20 Plateau Pressure:  [20 cmH20-21 cmH20] 20 cmH20   Intake/Output Summary (Last 24 hours) at 02/19/2023 0732 Last data filed at 02/19/2023 6045 Gross per 24 hour  Intake 2588.57 ml  Output 745 ml  Net 1843.57 ml   Filed Weights   01/30/2023 1300 02/19/23 0500  Weight:  99.3 kg 100.6 kg    Examination: General: Critically ill-appearing male, laying in bed, intubated, sedated, no acute distress HENT: Atraumatic, normocephalic, neck supple, no JVD, orally intubated Lungs: Mechanical breath sounds throughout, even, synchronous with the ventilator (no spontaneous respiratory drive when flipped briefly to PSV) Cardiovascular: Regular rate and rhythm, S1-S2, no murmurs, rubs, gallops Abdomen: Soft, nontender, nondistended, no guarding or rebound tenderness, bowel sounds positive x 4 Extremities: Normal bulk and tone, no deformities, no edema Neuro: Unresponsive, no brainstem reflexes (absent cough/gag and corneal reflexes), is on heavy sedation for myoclonus, left pupil pinpoint and unreactive, right pupil ovoid 3 mm and unreactive GU: Foley catheter in place  Resolved Hospital Problem list     Assessment & Plan:   #Shock: Cardiogenic  #Out-of-Hospital Cardiac Arrest #STEMI #Acute Decompensated HFrEF PMHx: Hypertension S/p Cardiac Cath 11/25: Successful PCI with DES to ostial LAD, Residual 99% stenosis of OM2 which appeared chronic and collateralized, severe dilated cardiomyopathy with estimated LV 25% -Continuous cardiac monitoring -Maintain MAP >65 -Cautious IV fluids -Vasopressors as needed to maintain MAP goal -Lactic acid is normalized (5.3 ~ 1 ~ 1.5) -HS Troponin peaked at 532 -Echocardiogram pending -Cardiology following, appreciate input -Volume  removal with HD -Continue ASA and Brilinta  #Acute Hypoxic Respiratory Failure due to STEMI and Acute Decompensated HFrEF -Full vent support, implement lung protective strategies -Plateau pressures less than 30 cm H20 -Wean FiO2 & PEEP as tolerated to maintain O2 sats >92% -Follow intermittent Chest X-ray & ABG as needed -Spontaneous Breathing Trials when respiratory parameters met and mental status permits -Implement VAP Bundle -Prn Bronchodilators -Volume removal with HD  #ESRD on  Hemodialysis #Hyponatremia #Hyperkalemia ~ RESOLVED PMHx: S/p Renal transplant -Monitor I&O's / urinary output -Follow BMP -Ensure adequate renal perfusion -Avoid nephrotoxic agents as able -Replace electrolytes as indicated ~ Pharmacy following for assistance with electrolyte replacement -Consult Nephrology, appreciate input -Renal replacement therapy as per Nephrology -Hold Cellcept for now  #Mild Leukocytosis #Questionable UTI -Monitor fever curve -Trend WBC's & Procalcitonin -Follow cultures as above -Continue empiric Ceftriaxone pending cultures & sensitivities  #Diabetes Mellitus Type II #Hypothyroidism -CBG's q4h; Target range of 140 to 180 -SSI -Follow ICU Hypo/Hyperglycemia protocol -Check TSH and thyroid panel  #Acute Metabolic Encephalopathy #Myoclonus #Concern for Anoxic Brain Injury #Cocaine intoxication #Sedation needs in setting of mechanical ventilation CT Head 11/26: CT head: Limited study due to presence of contrast from cardiac catheterization performed earlier today. Hypodense focus in the left side of the pons and left thalamus, concerning for infarct versus artifact. Correlation with MRI of the brain recommended.  MRI Brain 11/26: Concerning for widespread anoxic brain injury, remote lacunar infarcts at the left thalamus and pons, few small foci of susceptibility artifact of the left frontoparietal region (no hemorrhage, but exam limited due to presence of IV contrast) -Maintain Normothermia -Maintain a RASS goal of 0 to -1 -Fentanyl, Versed, and Propofol as needed to maintain RASS goal and to terminate myoclonus -Avoid sedating medications as able -Daily wake up assessment -Continue Keppra  -Repeat EEG is pending -Neurology following, appreciate input      Patient is critically ill with multiorgan failure status post out-of-hospital cardiac arrest and STEMI.  Prognosis is extremely guarded with high risk for further decompensation, cardiac arrest  and death.  Patient also exhibiting myoclonus and with concern for anoxic brain injury.  Given current critical illness superimposed on multiple chronic comorbidities, overall long-term prognosis is extremely poor.  Recommend DNR/DNI status.  Consult palliative care consult to assist with goals of care conversations.    Best Practice (right click and "Reselect all SmartList Selections" daily)   Diet/type: NPO, start tube feeds DVT prophylaxis: prophylactic heparin  GI prophylaxis: PPI Lines: Central line, Arterial Line, and yes and it is still needed Foley:  Yes, and it is still needed Code Status:  full code Last date of multidisciplinary goals of care discussion [11/26]  11/26: Pt's fiance' updated at beside, pt's daughter updated via telephone on plan of care.  Labs   CBC: Recent Labs  Lab 01/31/2023 0645 02/16/2023 0810 02/13/2023 1152 02/19/23 0358  WBC 11.6*  --  19.8* 19.6*  HGB 9.3* 10.9* 10.1* 9.2*  HCT 31.1* 32.0* 31.4* 27.6*  MCV 101.3*  --  94.3 91.7  PLT 181  --  224 177    Basic Metabolic Panel: Recent Labs  Lab 02/12/2023 0645 02/02/2023 0810 02/20/2023 1442 02/09/2023 2013 02/06/2023 2244 02/19/23 0217 02/19/23 0358  NA 128*   < > 131* 134* 133* 131* 131*  K 3.4*   < > 5.4* 3.7 3.6 4.1 4.3  CL 93*   < > 99 98 97* 96* 96*  CO2 23   < > 24 26 26  23 23  GLUCOSE 621*   < > 300* 135* 128* 150* 161*  BUN 39*   < > 52* 35* 32* 35* 36*  CREATININE 4.36*   < > 4.55* 2.95* 2.98* 3.52* 3.63*  CALCIUM 9.7   < > 7.7* 8.1* 8.0* 8.0* 8.1*  MG 2.1  --   --   --   --   --  1.6*  PHOS  --   --   --   --   --   --  5.7*   < > = values in this interval not displayed.   GFR: Estimated Creatinine Clearance: 27.1 mL/min (A) (by C-G formula based on SCr of 3.63 mg/dL (H)). Recent Labs  Lab 02/23/2023 0645 02/03/2023 1152 02/23/2023 1310 02/19/23 0358  WBC 11.6* 19.8*  --  19.6*  LATICACIDVEN 5.3* 1.0 1.5  --     Liver Function Tests: Recent Labs  Lab 02/14/2023 1152 02/19/23 0358   AST 89* 35  ALT 97* 64*  ALKPHOS 124 94  BILITOT 0.6 0.6  PROT 5.0* 4.8*  ALBUMIN 2.8* 2.6*  2.7*   No results for input(s): "LIPASE", "AMYLASE" in the last 168 hours. No results for input(s): "AMMONIA" in the last 168 hours.  ABG    Component Value Date/Time   PHART 7.36 02/19/2023 0500   PCO2ART 39 02/19/2023 0500   PO2ART 159 (H) 02/19/2023 0500   HCO3 22.0 02/19/2023 0500   TCO2 27 01/25/2023 0810   ACIDBASEDEF 3.1 (H) 02/19/2023 0500   O2SAT 99.5 02/19/2023 0500     Coagulation Profile: No results for input(s): "INR", "PROTIME" in the last 168 hours.  Cardiac Enzymes: No results for input(s): "CKTOTAL", "CKMB", "CKMBINDEX", "TROPONINI" in the last 168 hours.  HbA1C: Hemoglobin A1C  Date/Time Value Ref Range Status  08/08/2022 12:00 AM 7.5  Final    Comment:    O'Connell  11/28/2021 12:00 AM 6.9  Final   Hgb A1c MFr Bld  Date/Time Value Ref Range Status  02/20/2023 11:52 AM 6.7 (H) 4.8 - 5.6 % Final    Comment:    (NOTE) Pre diabetes:          5.7%-6.4%  Diabetes:              >6.4%  Glycemic control for   <7.0% adults with diabetes     CBG: Recent Labs  Lab 02/16/2023 2128 02/12/2023 2240 02/01/2023 2338 02/19/23 0141 02/19/23 0401  GLUCAP 128* 108* 155* 149* 160*    Review of Systems:   Unable to assess due to intubation/sedation/critical illness   Past Medical History:  He,  has a past medical history of Acute kidney failure, unspecified (HCC), Anxiety, Diabetes mellitus, type 2 (HCC), GERD (gastroesophageal reflux disease), History of arterial disease of lower extremity, History of hepatitis B, Hypertension, Hypothyroidism, Mitral valve disorders(424.0), Pityriasis (12/27/2014), Primary pulmonary HTN (HCC), and Tricuspid valve disorders, specified as nonrheumatic.   Surgical History:   Past Surgical History:  Procedure Laterality Date   APPENDECTOMY     Carotid Doppler Ultrasound  06/14/2009   39% stenosis of bilateral internal carotid  artery, bilateral anterograde vertebral flow   CORONARY/GRAFT ACUTE MI REVASCULARIZATION N/A 02/02/2023   Procedure: Coronary/Graft Acute MI Revascularization;  Surgeon: Marcina Millard, MD;  Location: [facility] INVASIVE CV [facility];  Service: Cardiovascular;  Laterality: N/A;   DIALYSIS/PERMA CATHETER INSERTION N/A 02/04/2023   Procedure: DIALYSIS/PERMA CATHETER INSERTION;  Surgeon: Annice Needy, MD;  Location: [facility] INVASIVE CV LAB;  Service: Cardiovascular;  Laterality: N/A;  ESOPHAGOGASTRODUODENOSCOPY (EGD) WITH PROPOFOL N/A 01/21/2019   Procedure: ESOPHAGOGASTRODUODENOSCOPY (EGD) WITH PROPOFOL;  Surgeon: Toney Reil, MD;  Location: [facility];  Service: Endoscopy;  Laterality: N/A;  Diabetic - insulin   EYE SURGERY     right   HEMORROIDECTOMY     KIDNEY TRANSPLANT Right 2016   LEFT HEART CATH AND CORONARY ANGIOGRAPHY N/A 02/05/2023   Procedure: LEFT HEART CATH AND CORONARY ANGIOGRAPHY;  Surgeon: Marcina Millard, MD;  Location: [facility] INVASIVE CV LAB;  Service: Cardiovascular;  Laterality: N/A;   MECKEL DIVERTICULUM EXCISION     infancy   Myocardial Perfusion scan  01/17/2009   [facility], non- ischemic. LVEF= 55%   PARS PLANA VITRECTOMY Left 02/09/2015   Procedure: Pan retinal photocoagulation 40981;  Surgeon: Marcelene Butte, MD;  Location: [facility] ORS;  Service: Ophthalmology;  Laterality: Left;   REFRACTIVE SURGERY Left    sleep study  01/09/2011   Severe sleep apnea. AHI 72.9/hr. RDI=83.0/hr. Desaturation to 69.0% Emergency CPAP titaration to 14.0cm (01/14/19 resolved after wt loss after kidney transplant.)     Social History:   reports that he has been smoking cigarettes. He has a 41 pack-year smoking history. He has never used smokeless tobacco. He reports that he does not currently use alcohol. He reports that he does not use drugs.   Family History:  His family history includes Hyperlipidemia in his mother; Hypertension in his mother; Melanoma in his  father.   Allergies Allergies  Allergen Reactions   No Known Allergies      Home Medications  Prior to Admission medications   Medication Sig Start Date End Date Taking? Authorizing Provider  alprazolam Prudy Feeler) 2 MG tablet TAKE 1 TABLET BY MOUTH THREE TIMES DAILY AS NEEDED FOR ANXIETY 01/21/23   [name], MD  amLODipine (NORVASC) 10 MG tablet Take 10 mg by mouth daily.  01/02/19   [provider]  atorvastatin (LIPITOR) 80 MG tablet Take 1 tablet (80 mg total) by mouth daily. 10/18/22   [name], MD  BD INSULIN SYRINGE U/F 31G X 5/16" 1 ML MISC  12/29/18   [provider]  calcitRIOL (ROCALTROL) 0.25 MCG capsule Take 1 capsule (0.25 mcg total) by mouth daily. 04/11/21   [name], MD  carvedilol (COREG) 12.5 MG tablet Take 1.5 tablets (18.75 mg total) by mouth 2 (two) times daily with a meal. 01/21/23   Gollan, Tollie Pizza, MD  chlorhexidine (PERIDEX) 0.12 % solution SMARTSIG:0.5 Capful(s) By Mouth Twice Daily Patient not taking: Reported on 02/04/2023 12/25/22   [provider]  cinacalcet (SENSIPAR) 30 MG tablet Take 30 mg by mouth daily. 11/22/22   [provider]  [name] 10 MG TABS tablet TAKE 1 TABLET BY MOUTH BEFORE BREAKFAST 10/23/22   [name], MD  insulin aspart (NOVOLOG) 100 UNIT/ML injection Inject 8 Units into the skin 3 (three) times daily before meals. and reports adjusting per sliding scale from Endocrinologist if blood sugar greater than 120 prior to mea    [provider]  insulin degludec (TRESIBA FLEXTOUCH) 100 UNIT/ML FlexTouch Pen Inject 39 Units into the skin daily. Patient receives via [name] Patient Assistance through Dec 2023 Patient taking differently: Inject 18 Units into the skin daily. Patient receives via [name] Patient Assistance through Dec 2023 07/17/22   [name], MD  losartan (COZAAR) 100 MG tablet Take 1 tablet (100 mg total) by mouth at bedtime. 04/11/21   [name], MD  mycophenolate (CELLCEPT)  500 MG tablet Take 1,000 mg by mouth 2 (two) times daily.    [provider]  naloxone [facility]) nasal spray 4 mg/0.1 mL Place 1 spray into the nose once.    [provider]  omeprazole (PRILOSEC) 20 MG capsule Take 20 mg by mouth daily as needed.    [provider]  oxyCODONE (OXY IR/ROXICODONE) 5 MG immediate release tablet Take 1-2 tablets (5-10 mg total) by mouth every 6 (six) hours as needed for severe pain (pain score 7-10). 02/05/23   [name], MD  Semaglutide,0.25 or 0.5MG /DOS, (OZEMPIC, 0.25 OR 0.5 MG/DOSE,) 2 MG/3ML SOPN Inject 0.5 mg into the skin once a week. Patient receives via [name] Patient Assistance Patient taking differently: Inject 1 mg into the skin once a week. Patient receives via [name] Patient Assistance 05/28/22   [name], MD  sildenafil (REVATIO) 20 MG tablet  05/26/21   [provider]  sildenafil (VIAGRA) 100 MG tablet Take 1 tab 1 hour prior to intercourse 10/31/22   Stoioff, Verna Czech, MD  tacrolimus (PROGRAF) 1 MG capsule Take 3 mg by mouth 2 (two) times daily.  01/11/15   [provider]  tamsulosin (FLOMAX) 0.4 MG CAPS capsule Take 1 capsule (0.4 mg total) by mouth daily. 10/18/22   [name], MD  tretinoin (RETIN-A) 0.05 % cream Apply topically as needed.     [provider]  triamcinolone cream (KENALOG) 0.1 % APPLY DAILY TO INFLAMED BUMPS AS NEEDED 10/06/18   [provider]  XYOSTED 50 MG/0.5ML SOAJ  05/26/21   [provider]     Critical care time: 40 minutes     Harlon Ditty, AGACNP-BC [facility] Prefer epic messenger for cross cover needs If after hours, please call E-link

## 2023-02-19 NOTE — Procedures (Signed)
Patient Name: James Moreno  MRN: [id number]  Epilepsy Attending: Charlsie Quest  Referring Physician/Provider: Caryl Pina, MD  Date: 02/19/2023 Duration: 28.20 mins   Patient history: 57 year old male status post cardiac arrest.  EEG evaluate for seizure.   Level of alertness: comatose   AEDs during EEG study: LEV, versed, propofol   Technical aspects: This EEG study was done with scalp electrodes positioned according to the 10-20 International system of electrode placement. Electrical activity was reviewed with band pass filter of 1-70Hz , sensitivity of 7 uV/mm, display speed of 64mm/sec with a 60Hz  notched filter applied as appropriate. EEG data were recorded continuously and digitally stored.  Video monitoring was available and reviewed as appropriate.   Description: EEG showed burst suppression with burst of 3-7hz  theta-delta slowing lasting 5-10 seconds alternating with 10-15 seconds of generalized background suppression. Hyperventilation and photic stimulation were not performed.      ABNORMALITY - Burst suppression, generalized   IMPRESSION: This study was suggestive of profound diffuse encephalopathy.  No seizures were noted.   [name]

## 2023-02-19 NOTE — Plan of Care (Signed)
  Problem: Safety: Goal: Ability to remain free from injury will improve Outcome: Progressing   Problem: Skin Integrity: Goal: Risk for impaired skin integrity will decrease Outcome: Progressing   Problem: Cardiovascular: Goal: Ability to achieve and maintain adequate cardiovascular perfusion will improve Outcome: Progressing Goal: Vascular access site(s) Level 0-1 will be maintained Outcome: Progressing   Problem: Metabolic: Goal: Ability to maintain appropriate glucose levels will improve Outcome: Progressing   Problem: Skin Integrity: Goal: Risk for impaired skin integrity will decrease Outcome: Progressing   Problem: Tissue Perfusion: Goal: Adequacy of tissue perfusion will improve Outcome: Progressing

## 2023-02-19 NOTE — Progress Notes (Signed)
Pt transported to MRI on the vent and returned to ICU 8 without incident.Pt remains on the vent and is tol well at this time.

## 2023-02-19 NOTE — Progress Notes (Signed)
Subjective: Sedated on propofol, Versed and fentanyl.   Objective: Current vital signs: BP 118/62   Pulse 76   Temp 98.6 F (37 C) (Axillary)   Resp 20   Ht 5' 10.98" (1.803 m)   Wt 100.6 kg   SpO2 98%   BMI 30.95 kg/m  Vital signs in last 24 hours: Temp:  [95.4 F (35.2 C)-99.2 F (37.3 C)] 98.6 F (37 C) (11/26 1148) Pulse Rate:  [67-81] 76 (11/26 1300) Resp:  [6-26] 20 (11/26 1300) BP: (86-138)/(54-72) 118/62 (11/26 1300) SpO2:  [96 %-100 %] 98 % (11/26 1300) Arterial Line BP: (100-155)/(40-61) 126/44 (11/26 1300) FiO2 (%):  [40 %-50 %] 40 % (11/26 1158) Weight:  [100.6 kg] 100.6 kg (11/26 0500)  Intake/Output from previous day: 11/25 0701 - 11/26 0700 In: 2588.6 [I.V.:1993.2; NG/GT:150; IV Piggyback:445.3] Out: 745 [Urine:395; Emesis/NG output:50] Intake/Output this shift: Total I/O In: 553.1 [I.V.:323.1; NG/GT:30; IV Piggyback:200] Out: 80 [Urine:80] Nutritional status:  Diet Order             Diet NPO time specified  Diet effective now                  Physical Exam HEENT- [place]/AT. No meningismus.    Lungs- Intubated Extremities- Noncyanotic   Neurological Examination Mental Status: Intubated and sedated on propofol at a rate of 25, Versed at a rate of 10 and fentanyl at a rate of 100. No eye opening spontaneously or to stimulation. Not responding to voice. No attempts to communicate. No limb withdrawal to noxious stimuli. No posturing noted.   Cranial Nerves: II: No blink to threat. Left pupil 2 mm and unreactive. Right pupil ovoid, approximately 2 x 4 mm and unreactive. III,IV, VI: Eyes will remain open after passive opening by examiner. Eyes are midline and conjugate. Absent oculocephalic reflex. No nystagmus.  V: Absent corneal reflexes bilaterally . VII: Flaccidly symmetric. Low amplitude twitching seen yesterday is now resolved VIII: No response to voice IX,X: Intubated XI: Head is midline XII: Unable to assess Motor/Sensory: BUE: Flaccid  tone. No posturing, withdrawal or other movement to noxious.  BLE: Flaccid tone. No posturing, withdrawal or other movement to noxious.  Low-amplitude myoclonic jerking of the BLE that was seen yesterday is now resolved.   Deep Tendon Reflexes: Hypoactive x 4 (sedated) Cerebellar: Unable to assess Gait: Unable to assess   Lab Results: Results for orders placed or performed during the hospital encounter of 01/30/2023 (from the past 48 hour(s))  CBG monitoring, ED     Status: Abnormal   Collection Time: 02/04/2023  6:27 AM  Result Value Ref Range   Glucose-Capillary 224 (H) 70 - 99 mg/dL    Comment: Glucose reference range applies only to samples taken after fasting for at least 8 hours.  Basic metabolic panel     Status: Abnormal   Collection Time: 01/27/2023  6:45 AM  Result Value Ref Range   Sodium 128 (L) 135 - 145 mmol/L   Potassium 3.4 (L) 3.5 - 5.1 mmol/L   Chloride 93 (L) 98 - 111 mmol/L   CO2 23 22 - 32 mmol/L   Glucose, Bld 621 (HH) 70 - 99 mg/dL    Comment: CRITICAL RESULT CALLED TO, READ BACK BY AND VERIFIED WITH SANDY EDDINS AT 8416 02/02/2023 DAS Glucose reference range applies only to samples taken after fasting for at least 8 hours.    BUN 39 (H) 6 - 20 mg/dL   Creatinine, Ser 6.06 (H) 0.61 - 1.24 mg/dL  Calcium 9.7 8.9 - 10.3 mg/dL   GFR, Estimated 15 (L) >60 mL/min    Comment: (NOTE) Calculated using the CKD-EPI Creatinine Equation (2021)    Anion gap 12 5 - 15    Comment: Performed at [facility], [address] [place], [place] [address]  CBC     Status: Abnormal   Collection Time: 02/22/2023  6:45 AM  Result Value Ref Range   WBC 11.6 (H) 4.0 - 10.5 K/uL   RBC 3.07 (L) 4.22 - 5.81 MIL/uL   Hemoglobin 9.3 (L) 13.0 - 17.0 g/dL   HCT 60.4 (L) 54.0 - 98.1 %   MCV 101.3 (H) 80.0 - 100.0 fL   MCH 30.3 26.0 - 34.0 pg   MCHC 29.9 (L) 30.0 - 36.0 g/dL   RDW 19.1 47.8 - 29.5 %   Platelets 181 150 - 400 K/uL   nRBC 0.2 0.0 - 0.2 %    Comment: Performed at  [facility], [address] [place], [place] [address]  Lactic acid, plasma     Status: Abnormal   Collection Time: 01/28/2023  6:45 AM  Result Value Ref Range   Lactic Acid, Venous 5.3 (HH) 0.5 - 1.9 mmol/L    Comment: CRITICAL RESULT CALLED TO, READ BACK BY AND VERIFIED WITH SANDY EDDINS AT [phone] 01/25/2023 DAS Performed at [facility], [address], [place], [place] [address]   Magnesium     Status: None   Collection Time: 01/25/2023  6:45 AM  Result Value Ref Range   Magnesium 2.1 1.7 - 2.4 mg/dL    Comment: Performed at [facility], [address] [place], [place] [address]  Troponin I (High Sensitivity)     Status: Abnormal   Collection Time: 02/13/2023  6:45 AM  Result Value Ref Range   Troponin I (High Sensitivity) 382 (HH) <18 ng/L    Comment: CRITICAL RESULT CALLED TO, READ BACK BY AND VERIFIED WITH SANDY EDDINS AT 4132 02/16/2023 DAS (NOTE) Elevated high sensitivity troponin I (hsTnI) values and significant  changes across serial measurements may suggest ACS but many other  chronic and acute conditions are known to elevate hsTnI results.  Refer to the "Links" section for chest pain algorithms and additional  guidance. Performed at [facility], [address] [place], [place] [address]   Brain natriuretic peptide     Status: Abnormal   Collection Time: 02/12/2023  6:45 AM  Result Value Ref Range   B Natriuretic Peptide 1,575.2 (H) 0.0 - 100.0 pg/mL    Comment: Performed at [facility], [address] [place], [place] [address]  I-STAT 7, (LYTES, BLD GAS, ICA, H+H)     Status: Abnormal   Collection Time: 02/16/2023  8:10 AM  Result Value Ref Range   pH, Arterial 7.349 (L) 7.35 - 7.45   pCO2 arterial 46.4 32 - 48 mmHg   pO2, Arterial 358 (H) 83 - 108 mmHg   Bicarbonate 25.6 20.0 - 28.0 mmol/L   TCO2 27 22 - 32 mmol/L   O2 Saturation 100 %   Acid-Base Excess 0.0 0.0 - 2.0 mmol/L   Sodium 130 (L) 135 - 145 mmol/L    Potassium 5.1 3.5 - 5.1 mmol/L   Calcium, Ion 1.34 1.15 - 1.40 mmol/L   HCT 32.0 (L) 39.0 - 52.0 %   Hemoglobin 10.9 (L) 13.0 - 17.0 g/dL   Sample type ARTERIAL   POCT Activated clotting time     Status: None  Collection Time: 02/20/2023  8:13 AM  Result Value Ref Range   Activated Clotting Time 337 seconds    Comment: Reference range 74-137 seconds for patients not on anticoagulant therapy.  Glucose, capillary     Status: Abnormal   Collection Time: 02/01/2023 10:22 AM  Result Value Ref Range   Glucose-Capillary 341 (H) 70 - 99 mg/dL    Comment: Glucose reference range applies only to samples taken after fasting for at least 8 hours.  Urinalysis, Complete w Microscopic -Urine, Catheterized     Status: Abnormal   Collection Time: 01/25/2023 10:51 AM  Result Value Ref Range   Color, Urine YELLOW (A) YELLOW   APPearance CLOUDY (A) CLEAR   Specific Gravity, Urine 1.026 1.005 - 1.030   pH 6.0 5.0 - 8.0   Glucose, UA >=500 (A) NEGATIVE mg/dL   Hgb urine dipstick MODERATE (A) NEGATIVE   Bilirubin Urine NEGATIVE NEGATIVE   Ketones, ur NEGATIVE NEGATIVE mg/dL   Protein, ur >=960 (A) NEGATIVE mg/dL   Nitrite NEGATIVE NEGATIVE   Leukocytes,Ua NEGATIVE NEGATIVE   RBC / HPF 11-20 0 - 5 RBC/hpf   WBC, UA 21-50 0 - 5 WBC/hpf   Bacteria, UA RARE (A) NONE SEEN   Squamous Epithelial / HPF 0-5 0 - 5 /HPF   Mucus PRESENT    Hyaline Casts, UA PRESENT     Comment: Performed at [facility], [address] [place], [place] [address]  Urine Drug Screen, Qualitative ([facility] only)     Status: Abnormal   Collection Time: 02/05/2023 10:51 AM  Result Value Ref Range   Tricyclic, Ur Screen NONE DETECTED NONE DETECTED   Amphetamines, Ur Screen NONE DETECTED NONE DETECTED   MDMA (Ecstasy)Ur Screen NONE DETECTED NONE DETECTED   Cocaine Metabolite,Ur [place] POSITIVE (A) NONE DETECTED   Opiate, Ur Screen NONE DETECTED NONE DETECTED   Phencyclidine (PCP) Ur S NONE DETECTED NONE DETECTED   Cannabinoid 50 Ng,  Ur [place] NONE DETECTED NONE DETECTED   Barbiturates, Ur Screen NONE DETECTED NONE DETECTED   Benzodiazepine, Ur Scrn NONE DETECTED NONE DETECTED   Methadone Scn, Ur NONE DETECTED NONE DETECTED    Comment: (NOTE) Tricyclics + metabolites, urine    Cutoff 1000 ng/mL Amphetamines + metabolites, urine  Cutoff 1000 ng/mL MDMA (Ecstasy), urine              Cutoff 500 ng/mL Cocaine Metabolite, urine          Cutoff 300 ng/mL Opiate + metabolites, urine        Cutoff 300 ng/mL Phencyclidine (PCP), urine         Cutoff 25 ng/mL Cannabinoid, urine                 Cutoff 50 ng/mL Barbiturates + metabolites, urine  Cutoff 200 ng/mL Benzodiazepine, urine              Cutoff 200 ng/mL Methadone, urine                   Cutoff 300 ng/mL  The urine drug screen provides only a preliminary, unconfirmed analytical test result and should not be used for non-medical purposes. Clinical consideration and professional judgment should be applied to any positive drug screen result due to possible interfering substances. A more specific alternate chemical method must be used in order to obtain a confirmed analytical result. Gas chromatography / mass spectrometry (GC/MS) is the preferred confirm atory method. Performed at [facility], [address]  [place], [place] [address]   Urinalysis, w/ Reflex to Culture (Infection Suspected) -     Status: Abnormal   Collection Time: 02/16/2023 10:51 AM  Result Value Ref Range   Specimen Source URINE, RANDOM    Color, Urine YELLOW (A) YELLOW   APPearance CLOUDY (A) CLEAR   Specific Gravity, Urine 1.026 1.005 - 1.030   pH 5.0 5.0 - 8.0   Glucose, UA >=500 (A) NEGATIVE mg/dL   Hgb urine dipstick MODERATE (A) NEGATIVE   Bilirubin Urine NEGATIVE NEGATIVE   Ketones, ur NEGATIVE NEGATIVE mg/dL   Protein, ur >=604 (A) NEGATIVE mg/dL   Nitrite NEGATIVE NEGATIVE   Leukocytes,Ua NEGATIVE NEGATIVE   RBC / HPF 11-20 0 - 5 RBC/hpf   WBC, UA 11-20 0 - 5 WBC/hpf     Comment:        Reflex urine culture not performed if WBC <=10, OR if Squamous epithelial cells >5. If Squamous epithelial cells >5 suggest recollection.    Bacteria, UA NONE SEEN NONE SEEN   Squamous Epithelial / HPF 0-5 0 - 5 /HPF   Mucus PRESENT    Non Squamous Epithelial PRESENT (A) NONE SEEN    Comment: Performed at [facility], [address] [place], [place] [address]  MRSA Next Gen by PCR, Nasal     Status: None   Collection Time: 02/17/2023 10:59 AM   Specimen: Nasal Mucosa; Nasal Swab  Result Value Ref Range   MRSA by PCR Next Gen NOT DETECTED NOT DETECTED    Comment: (NOTE) The GeneXpert MRSA Assay (FDA approved for NASAL specimens only), is one component of a comprehensive MRSA colonization surveillance program. It is not intended to diagnose MRSA infection nor to guide or monitor treatment for MRSA infections. Test performance is not FDA approved in patients less than 65 years old. Performed at [facility], [address] [place], [place] [address]   Draw ABG 1 hour after initiation of ventilator     Status: Abnormal   Collection Time: 02/08/2023 11:43 AM  Result Value Ref Range   FIO2 100 %   Delivery systems VENTILATOR    Mode PRESSURE REGULATED VOLUME CONTROL    MECHVT 500 mL   PEEP 10 cm H20   pH, Arterial 7.33 (L) 7.35 - 7.45   pCO2 arterial 48 32 - 48 mmHg   pO2, Arterial 180 (H) 83 - 108 mmHg   Bicarbonate 25.3 20.0 - 28.0 mmol/L   Acid-base deficit 1.1 0.0 - 2.0 mmol/L   O2 Saturation 98.5 %   Patient temperature 37.0    Collection site A-LINE    Mechanical Rate 20     Comment: Performed at [facility], [address] [place], [place] [address]  Troponin I (High Sensitivity)     Status: Abnormal   Collection Time: 02/19/2023 11:52 AM  Result Value Ref Range   Troponin I (High Sensitivity) 532 (HH) <18 ng/L    Comment: CRITICAL VALUE NOTED. VALUE IS CONSISTENT WITH PREVIOUSLY REPORTED/CALLED VALUE MU (NOTE) Elevated  high sensitivity troponin I (hsTnI) values and significant  changes across serial measurements may suggest ACS but many other  chronic and acute conditions are known to elevate hsTnI results.  Refer to the "Links" section for chest pain algorithms and additional  guidance. Performed at [facility], [address] [place], [place] [address]   Lactic acid, plasma     Status: None   Collection Time: 02/15/2023 11:52 AM  Result Value Ref Range  Lactic Acid, Venous 1.0 0.5 - 1.9 mmol/L    Comment: Performed at [facility], [address] [place], [place] [address]  Beta-hydroxybutyric acid     Status: Abnormal   Collection Time: 01/25/2023 11:52 AM  Result Value Ref Range   Beta-Hydroxybutyric Acid 0.39 (H) 0.05 - 0.27 mmol/L    Comment: Performed at [facility], [address] [place], [place] [address]  Comprehensive metabolic panel     Status: Abnormal   Collection Time: 02/17/2023 11:52 AM  Result Value Ref Range   Sodium 130 (L) 135 - 145 mmol/L   Potassium 6.2 (H) 3.5 - 5.1 mmol/L   Chloride 95 (L) 98 - 111 mmol/L   CO2 25 22 - 32 mmol/L   Glucose, Bld 341 (H) 70 - 99 mg/dL    Comment: Glucose reference range applies only to samples taken after fasting for at least 8 hours.   BUN 49 (H) 6 - 20 mg/dL   Creatinine, Ser 4.01 (H) 0.61 - 1.24 mg/dL   Calcium 8.4 (L) 8.9 - 10.3 mg/dL   Total Protein 5.0 (L) 6.5 - 8.1 g/dL   Albumin 2.8 (L) 3.5 - 5.0 g/dL   AST 89 (H) 15 - 41 U/L   ALT 97 (H) 0 - 44 U/L   Alkaline Phosphatase 124 38 - 126 U/L   Total Bilirubin 0.6 <1.2 mg/dL   GFR, Estimated 13 (L) >60 mL/min    Comment: (NOTE) Calculated using the CKD-EPI Creatinine Equation (2021)    Anion gap 10 5 - 15    Comment: Performed at [facility], [address] [place], [place] [address]  CBC     Status: Abnormal   Collection Time: 01/31/2023 11:52 AM  Result Value Ref Range   WBC 19.8 (H) 4.0 - 10.5 K/uL   RBC 3.33 (L) 4.22 - 5.81 MIL/uL    Hemoglobin 10.1 (L) 13.0 - 17.0 g/dL   HCT 36.6 (L) 44.0 - 34.7 %   MCV 94.3 80.0 - 100.0 fL   MCH 30.3 26.0 - 34.0 pg   MCHC 32.2 30.0 - 36.0 g/dL   RDW 42.5 95.6 - 38.7 %   Platelets 224 150 - 400 K/uL   nRBC 0.0 0.0 - 0.2 %    Comment: Performed at [facility], [address] [place], [place] [address]  Hemoglobin A1c     Status: Abnormal   Collection Time: 02/08/2023 11:52 AM  Result Value Ref Range   Hgb A1c MFr Bld 6.7 (H) 4.8 - 5.6 %    Comment: (NOTE) Pre diabetes:          5.7%-6.4%  Diabetes:              >6.4%  Glycemic control for   <7.0% adults with diabetes    Mean Plasma Glucose 145.59 mg/dL    Comment: Performed at [facility], [address] [place], [place] [address]  Glucose, capillary     Status: Abnormal   Collection Time: 01/29/2023 12:02 PM  Result Value Ref Range   Glucose-Capillary 304 (H) 70 - 99 mg/dL    Comment: Glucose reference range applies only to samples taken after fasting for at least 8 hours.  Lactic acid, plasma     Status: None   Collection Time: 01/26/2023  1:10 PM  Result Value Ref Range   Lactic Acid, Venous 1.5 0.5 - 1.9 mmol/L    Comment: Performed at [facility], [address]  [place], [place] [address]  Culture, blood (Routine X 2) w Reflex to ID Panel     Status: None (Preliminary result)   Collection Time: 02/12/2023  1:12 PM   Specimen: BLOOD RIGHT HAND  Result Value Ref Range   Specimen Description BLOOD RIGHT HAND    Special Requests      Blood Culture results may not be optimal due to an excessive volume of blood received in culture bottles   Culture      NO GROWTH < 24 HOURS Performed at [facility], [address] [place], [place] [address]    Report Status PENDING   Culture, blood (Routine X 2) w Reflex to ID Panel     Status: None (Preliminary result)   Collection Time: 02/14/2023  1:22 PM   Specimen: A-Line; Blood  Result Value Ref Range   Specimen Description A-LINE     Special Requests BCAV    Culture      NO GROWTH < 24 HOURS Performed at [facility], [address], [place], [place] [address]    Report Status PENDING   Basic metabolic panel     Status: Abnormal   Collection Time: 02/17/2023  2:42 PM  Result Value Ref Range   Sodium 131 (L) 135 - 145 mmol/L   Potassium 5.4 (H) 3.5 - 5.1 mmol/L   Chloride 99 98 - 111 mmol/L   CO2 24 22 - 32 mmol/L   Glucose, Bld 300 (H) 70 - 99 mg/dL    Comment: Glucose reference range applies only to samples taken after fasting for at least 8 hours.   BUN 52 (H) 6 - 20 mg/dL   Creatinine, Ser 2.95 (H) 0.61 - 1.24 mg/dL   Calcium 7.7 (L) 8.9 - 10.3 mg/dL   GFR, Estimated 14 (L) >60 mL/min    Comment: (NOTE) Calculated using the CKD-EPI Creatinine Equation (2021)    Anion gap 8 5 - 15    Comment: Performed at [facility], [address] [place], [place] [address]  Thyroid Panel With TSH     Status: None   Collection Time: 02/11/2023  2:42 PM  Result Value Ref Range   TSH 1.290 0.450 - 4.500 uIU/mL   T4, Total 6.1 4.5 - 12.0 ug/dL   T3 Uptake Ratio 37 24 - 39 %   Free Thyroxine Index 2.3 1.2 - 4.9    Comment: (NOTE) Performed At: [facility] [address] [place], [place] [id number] Jolene Schimke MD EX:[id number]   Troponin I (High Sensitivity)     Status: Abnormal   Collection Time: 02/02/2023  2:42 PM  Result Value Ref Range   Troponin I (High Sensitivity) 460 (HH) <18 ng/L    Comment: CRITICAL VALUE NOTED. VALUE IS CONSISTENT WITH PREVIOUSLY REPORTED/CALLED VALUE MU (NOTE) Elevated high sensitivity troponin I (hsTnI) values and significant  changes across serial measurements may suggest ACS but many other  chronic and acute conditions are known to elevate hsTnI results.  Refer to the "Links" section for chest pain algorithms and additional  guidance. Performed at [facility], [address] [place], [place] [address]   Cooxemetry Panel (carboxy, met, total  hgb, O2 sat)     Status: Abnormal   Collection Time: 02/14/2023  3:12 PM  Result Value Ref Range   Total hemoglobin 9.8 (L) 12.0 - 16.0 g/dL   O2 Saturation 53.6 %   Carboxyhemoglobin 1.9 (H) 0.5 - 1.5 %   Methemoglobin <0.7 0.0 - 1.5 %  Total oxygen content 79.5 %    Comment: Performed at [facility], [address] [place], [place] [address]  Glucose, capillary     Status: Abnormal   Collection Time: 02/09/2023  3:41 PM  Result Value Ref Range   Glucose-Capillary 266 (H) 70 - 99 mg/dL    Comment: Glucose reference range applies only to samples taken after fasting for at least 8 hours.  Glucose, capillary     Status: Abnormal   Collection Time: 02/04/2023  4:49 PM  Result Value Ref Range   Glucose-Capillary 196 (H) 70 - 99 mg/dL    Comment: Glucose reference range applies only to samples taken after fasting for at least 8 hours.  Troponin I (High Sensitivity)     Status: Abnormal   Collection Time: 01/25/2023  4:55 PM  Result Value Ref Range   Troponin I (High Sensitivity) 483 (HH) <18 ng/L    Comment: CRITICAL VALUE NOTED. VALUE IS CONSISTENT WITH PREVIOUSLY REPORTED/CALLED VALUE SKL (NOTE) Elevated high sensitivity troponin I (hsTnI) values and significant  changes across serial measurements may suggest ACS but many other  chronic and acute conditions are known to elevate hsTnI results.  Refer to the "Links" section for chest pain algorithms and additional  guidance. Performed at [facility], [address] [place], [place] [address]   Glucose, capillary     Status: Abnormal   Collection Time: 02/19/2023  5:52 PM  Result Value Ref Range   Glucose-Capillary 216 (H) 70 - 99 mg/dL    Comment: Glucose reference range applies only to samples taken after fasting for at least 8 hours.  Glucose, capillary     Status: Abnormal   Collection Time: 02/12/2023  6:49 PM  Result Value Ref Range   Glucose-Capillary 177 (H) 70 - 99 mg/dL    Comment: Glucose reference range  applies only to samples taken after fasting for at least 8 hours.  Basic metabolic panel     Status: Abnormal   Collection Time: 02/15/2023  8:13 PM  Result Value Ref Range   Sodium 134 (L) 135 - 145 mmol/L   Potassium 3.7 3.5 - 5.1 mmol/L   Chloride 98 98 - 111 mmol/L   CO2 26 22 - 32 mmol/L   Glucose, Bld 135 (H) 70 - 99 mg/dL    Comment: Glucose reference range applies only to samples taken after fasting for at least 8 hours.   BUN 35 (H) 6 - 20 mg/dL   Creatinine, Ser 4.40 (H) 0.61 - 1.24 mg/dL   Calcium 8.1 (L) 8.9 - 10.3 mg/dL   GFR, Estimated 24 (L) >60 mL/min    Comment: (NOTE) Calculated using the CKD-EPI Creatinine Equation (2021)    Anion gap 10 5 - 15    Comment: Performed at [facility], [address] [place], [place] [address]  Cooxemetry Panel (carboxy, met, total hgb, O2 sat)     Status: Abnormal   Collection Time: 02/12/2023  8:13 PM  Result Value Ref Range   Total hemoglobin 10.5 (L) 12.0 - 16.0 g/dL   O2 Saturation 53.6 %   Carboxyhemoglobin 1.2 0.5 - 1.5 %   Methemoglobin <0.7 0.0 - 1.5 %   Total oxygen content 97.0 %    Comment: Performed at [facility], [address] [place], [place] [address]  Hepatitis B surface antigen     Status: None   Collection Time: 02/04/2023  8:13 PM  Result Value Ref Range   Hepatitis B Surface  Ag NON REACTIVE NON REACTIVE    Comment: Performed at [facility], [address], [place], [place] [address]  Glucose, capillary     Status: Abnormal   Collection Time: 02/19/2023  8:19 PM  Result Value Ref Range   Glucose-Capillary 150 (H) 70 - 99 mg/dL    Comment: Glucose reference range applies only to samples taken after fasting for at least 8 hours.  Glucose, capillary     Status: Abnormal   Collection Time: 02/06/2023  9:28 PM  Result Value Ref Range   Glucose-Capillary 128 (H) 70 - 99 mg/dL    Comment: Glucose reference range applies only to samples taken after fasting for at least 8 hours.  Glucose,  capillary     Status: Abnormal   Collection Time: 01/29/2023 10:40 PM  Result Value Ref Range   Glucose-Capillary 108 (H) 70 - 99 mg/dL    Comment: Glucose reference range applies only to samples taken after fasting for at least 8 hours.  Basic metabolic panel     Status: Abnormal   Collection Time: 02/14/2023 10:44 PM  Result Value Ref Range   Sodium 133 (L) 135 - 145 mmol/L   Potassium 3.6 3.5 - 5.1 mmol/L   Chloride 97 (L) 98 - 111 mmol/L   CO2 26 22 - 32 mmol/L   Glucose, Bld 128 (H) 70 - 99 mg/dL    Comment: Glucose reference range applies only to samples taken after fasting for at least 8 hours.   BUN 32 (H) 6 - 20 mg/dL   Creatinine, Ser 9.93 (H) 0.61 - 1.24 mg/dL   Calcium 8.0 (L) 8.9 - 10.3 mg/dL   GFR, Estimated 24 (L) >60 mL/min    Comment: (NOTE) Calculated using the CKD-EPI Creatinine Equation (2021)    Anion gap 10 5 - 15    Comment: Performed at [facility], [address] [place], [place] [address]  Glucose, capillary     Status: Abnormal   Collection Time: 01/31/2023 11:38 PM  Result Value Ref Range   Glucose-Capillary 155 (H) 70 - 99 mg/dL    Comment: Glucose reference range applies only to samples taken after fasting for at least 8 hours.  Glucose, capillary     Status: Abnormal   Collection Time: 02/19/23  1:41 AM  Result Value Ref Range   Glucose-Capillary 149 (H) 70 - 99 mg/dL    Comment: Glucose reference range applies only to samples taken after fasting for at least 8 hours.  Basic metabolic panel     Status: Abnormal   Collection Time: 02/19/23  2:17 AM  Result Value Ref Range   Sodium 131 (L) 135 - 145 mmol/L   Potassium 4.1 3.5 - 5.1 mmol/L   Chloride 96 (L) 98 - 111 mmol/L   CO2 23 22 - 32 mmol/L   Glucose, Bld 150 (H) 70 - 99 mg/dL    Comment: Glucose reference range applies only to samples taken after fasting for at least 8 hours.   BUN 35 (H) 6 - 20 mg/dL   Creatinine, Ser 7.89 (H) 0.61 - 1.24 mg/dL   Calcium 8.0 (L) 8.9 - 10.3 mg/dL    GFR, Estimated 19 (L) >60 mL/min    Comment: (NOTE) Calculated using the CKD-EPI Creatinine Equation (2021)    Anion gap 12 5 - 15    Comment: Performed at [facility], [address] [place], [place] [address]  CBC     Status: Abnormal   Collection  Time: 02/19/23  3:58 AM  Result Value Ref Range   WBC 19.6 (H) 4.0 - 10.5 K/uL   RBC 3.01 (L) 4.22 - 5.81 MIL/uL   Hemoglobin 9.2 (L) 13.0 - 17.0 g/dL   HCT 62.9 (L) 52.8 - 41.3 %   MCV 91.7 80.0 - 100.0 fL   MCH 30.6 26.0 - 34.0 pg   MCHC 33.3 30.0 - 36.0 g/dL   RDW 24.4 01.0 - 27.2 %   Platelets 177 150 - 400 K/uL   nRBC 0.0 0.0 - 0.2 %    Comment: Performed at [facility], [address] [place], [place] [address]  Renal function panel     Status: Abnormal   Collection Time: 02/19/23  3:58 AM  Result Value Ref Range   Sodium 131 (L) 135 - 145 mmol/L   Potassium 4.3 3.5 - 5.1 mmol/L   Chloride 96 (L) 98 - 111 mmol/L   CO2 23 22 - 32 mmol/L   Glucose, Bld 161 (H) 70 - 99 mg/dL    Comment: Glucose reference range applies only to samples taken after fasting for at least 8 hours.   BUN 36 (H) 6 - 20 mg/dL   Creatinine, Ser 4.03 (H) 0.61 - 1.24 mg/dL   Calcium 8.1 (L) 8.9 - 10.3 mg/dL   Phosphorus 5.7 (H) 2.5 - 4.6 mg/dL   Albumin 2.7 (L) 3.5 - 5.0 g/dL   GFR, Estimated 19 (L) >60 mL/min    Comment: (NOTE) Calculated using the CKD-EPI Creatinine Equation (2021)    Anion gap 12 5 - 15    Comment: Performed at [facility], [address] [place], [place] [address]  Triglycerides     Status: Abnormal   Collection Time: 02/19/23  3:58 AM  Result Value Ref Range   Triglycerides 189 (H) <150 mg/dL    Comment: Performed at [facility], [address] [place], [place] [address]  Hepatic function panel     Status: Abnormal   Collection Time: 02/19/23  3:58 AM  Result Value Ref Range   Total Protein 4.8 (L) 6.5 - 8.1 g/dL   Albumin 2.6 (L) 3.5 - 5.0 g/dL   AST 35 15 - 41 U/L   ALT 64  (H) 0 - 44 U/L   Alkaline Phosphatase 94 38 - 126 U/L   Total Bilirubin 0.6 <1.2 mg/dL   Bilirubin, Direct <7.5 0.0 - 0.2 mg/dL   Indirect Bilirubin NOT CALCULATED 0.3 - 0.9 mg/dL    Comment: Performed at [facility], [address] [place], [place] [address]  Magnesium     Status: Abnormal   Collection Time: 02/19/23  3:58 AM  Result Value Ref Range   Magnesium 1.6 (L) 1.7 - 2.4 mg/dL    Comment: Performed at [facility], [address] [place], [place] [address]  Glucose, capillary     Status: Abnormal   Collection Time: 02/19/23  4:01 AM  Result Value Ref Range   Glucose-Capillary 160 (H) 70 - 99 mg/dL    Comment: Glucose reference range applies only to samples taken after fasting for at least 8 hours.  Blood gas, arterial     Status: Abnormal   Collection Time: 02/19/23  5:00 AM  Result Value Ref Range   FIO2 50 %   Delivery systems VENTILATOR    MECHVT 500 mL   PEEP 10 cm H20   pH, Arterial 7.36 7.35 - 7.45   pCO2 arterial 39 32 - 48 mmHg  pO2, Arterial 159 (H) 83 - 108 mmHg   Bicarbonate 22.0 20.0 - 28.0 mmol/L   Acid-base deficit 3.1 (H) 0.0 - 2.0 mmol/L   O2 Saturation 99.5 %   Patient temperature 37.0    Collection site A-LINE    Mechanical Rate 20     Comment: Performed at [facility], [address] [place], [place] [address]  Glucose, capillary     Status: Abnormal   Collection Time: 02/19/23  7:54 AM  Result Value Ref Range   Glucose-Capillary 165 (H) 70 - 99 mg/dL    Comment: Glucose reference range applies only to samples taken after fasting for at least 8 hours.  Glucose, capillary     Status: Abnormal   Collection Time: 02/19/23 11:09 AM  Result Value Ref Range   Glucose-Capillary 167 (H) 70 - 99 mg/dL    Comment: Glucose reference range applies only to samples taken after fasting for at least 8 hours.    Recent Results (from the past 240 hour(s))  MRSA Next Gen by PCR, Nasal     Status: None   Collection Time:  02/13/2023 10:59 AM   Specimen: Nasal Mucosa; Nasal Swab  Result Value Ref Range Status   MRSA by PCR Next Gen NOT DETECTED NOT DETECTED Final    Comment: (NOTE) The GeneXpert MRSA Assay (FDA approved for NASAL specimens only), is one component of a comprehensive MRSA colonization surveillance program. It is not intended to diagnose MRSA infection nor to guide or monitor treatment for MRSA infections. Test performance is not FDA approved in patients less than 43 years old. Performed at [facility], [address] [place], [place] [address]   Culture, blood (Routine X 2) w Reflex to ID Panel     Status: None (Preliminary result)   Collection Time: 02/04/2023  1:12 PM   Specimen: BLOOD RIGHT HAND  Result Value Ref Range Status   Specimen Description BLOOD RIGHT HAND  Final   Special Requests   Final    Blood Culture results may not be optimal due to an excessive volume of blood received in culture bottles   Culture   Final    NO GROWTH < 24 HOURS Performed at [facility], [address], [place], [place] [address]    Report Status PENDING  Incomplete  Culture, blood (Routine X 2) w Reflex to ID Panel     Status: None (Preliminary result)   Collection Time: 02/13/2023  1:22 PM   Specimen: A-Line; Blood  Result Value Ref Range Status   Specimen Description A-LINE  Final   Special Requests BCAV  Final   Culture   Final    NO GROWTH < 24 HOURS Performed at [facility], [address], [place], [place] [address]    Report Status PENDING  Incomplete    Lipid Panel Recent Labs    02/19/23 0358  TRIG 189*    Studies/Results: DG Chest Port 1 View  Result Date: 02/19/2023 CLINICAL DATA:  Acute respiratory failure, hypoxia EXAM: PORTABLE CHEST 1 VIEW COMPARISON:  01/29/2023 FINDINGS: Two frontal views of the chest demonstrate stable endotracheal tube, enteric catheter, and right internal jugular dialysis catheter. Left internal jugular catheter tip  again projects over the brachiocephalic confluence. Cardiac silhouette is stable. There is persistent pulmonary vascular congestion, likely not significantly changed given differences in patient positioning and technique. No effusion or pneumothorax. No acute bony abnormalities. IMPRESSION: 1. Support devices as above. 2. Pulmonary vascular congestion, with no  significant change in volume status. Electronically Signed   By: Sharlet Salina M.D.   On: 02/19/2023 09:04   MR BRAIN WO CONTRAST  Result Date: 02/19/2023 CLINICAL DATA:  Follow-up examination for stroke. EXAM: MRI HEAD WITHOUT CONTRAST TECHNIQUE: Multiplanar, multiecho pulse sequences of the brain and surrounding structures were obtained without intravenous contrast. COMPARISON:  Prior CT from 02/05/2023. FINDINGS: Brain: Cerebral volume within normal limits. Patchy T2/FLAIR hyperintensity involving the periventricular and deep white matter of both cerebral hemispheres as well as the pons, consistent with chronic small vessel ischemic disease. Remote lacunar infarcts present at the left thalamus and pons. Extensive diffusion signal abnormality seen involving the cortical gray matter of both cerebral hemispheres, slightly worse on the right. Symmetric involvement of the bilateral basal ganglia and thalami. Diffusion signal abnormality seen throughout the cerebellum. Associated T2/FLAIR signal abnormality throughout the areas affected. Findings consistent with widespread anoxic brain injury. No definite superimposed vascular infarct. Multiple scattered punctate foci of susceptibility artifact noted, consistent with small chronic micro hemorrhages, likely hypertensive in nature. Few small foci of susceptibility artifact noted within the left frontoparietal region. No visible acute hemorrhage seen at these locations on prior head CT. No mass lesion or midline shift. No hydrocephalus or extra-axial fluid collection. Pituitary gland and suprasellar region  within normal limits. Vascular: Major intracranial vascular flow voids are maintained. Skull and upper cervical spine: Craniocervical junction within normal limits. Decreased T1 signal intensity seen within the visualized bone marrow, nonspecific, but most commonly related to anemia, smoking, or obesity. No scalp soft tissue abnormality. Sinuses/Orbits: Prior ocular lens replacement on the right. Scattered mucosal thickening present throughout the paranasal sinuses. Trace bilateral mastoid effusions. Patient is intubated. Other: None. IMPRESSION: 1. Findings consistent with widespread anoxic brain injury as detailed above. 2. Underlying chronic microvascular ischemic disease with remote lacunar infarcts at the left thalamus and pons. 3. Few small foci of susceptibility artifact involving the left frontoparietal region. While no visible hemorrhage is seen or these locations on prior head CT, that exam was somewhat limited due to the presence of IV contrast material related to prior cardiac catheterization. Correlation with repeat noncontrast head CT suggested to evaluate for possible trace subarachnoid hemorrhage as warranted. Electronically Signed   By: Rise Mu M.D.   On: 02/19/2023 06:54   CT HEAD WO CONTRAST ( )  Result Date: 01/30/2023 CLINICAL DATA:  Mental status change, unknown cause. EXAM: CT HEAD WITHOUT CONTRAST TECHNIQUE: Contiguous axial images were obtained from the base of the skull through the vertex without intravenous contrast. RADIATION DOSE REDUCTION: This exam was performed according to the departmental dose-optimization program which includes automated exposure control, adjustment of the mA and/or kV according to patient size and/or use of iterative reconstruction technique. COMPARISON:  Head CT February 17, 2019. FINDINGS: Brain: Although no intravenous contrast was administered to obtain this study, the images are enhance by contrast administered during cardiac  catheterization performed earlier today. This evaluation for possible subarachnoid hemorrhage. Limits hypodense focus is seen on the left side of the pons, concerning for infarct versus artifact. A hypodense focus is also seen in the left thalamus. No large acute territorial infarct identified. No hydrocephalus, extra-axial collection or mass lesion/mass effect. Vascular: Calcified plaques in the bilateral carotid siphons. Skull: Normal. Negative for fracture or focal lesion. Sinuses/Orbits: Fluid within the paranasal sinuses and within the nasopharynx and oropharynx related to presence of endotracheal tube. The orbits are maintained. Other: Soft tissue swelling on the right side of the face may be  posttraumatic. IMPRESSION: 1. Limited study due to presence of contrast from cardiac catheterization performed earlier today. 2. Hypodense focus in the left side of the pons and left thalamus, concerning for infarct versus artifact. Correlation with MRI of the brain recommended. 3. Soft tissue swelling on the right side of the face may be posttraumatic. Electronically Signed   By: Baldemar Lenis M.D.   On: 02/14/2023 16:28   EEG adult  Result Date: 02/05/2023 Charlsie Quest, MD     01/25/2023  1:40 PM Patient Name: James Moreno MRN: [id number] Epilepsy Attending: Charlsie Quest Referring Physician/Provider: Judithe Modest, NP Date: 02/16/2023 Duration: 25.50 mins Patient history: 57 year old male status post cardiac arrest.  EEG evaluate for seizure. Level of alertness: comatose AEDs during EEG study: LEV, versed Technical aspects: This EEG study was done with scalp electrodes positioned according to the 10-20 International system of electrode placement. Electrical activity was reviewed with band pass filter of 1-70Hz , sensitivity of 7 uV/mm, display speed of 18mm/sec with a 60Hz  notched filter applied as appropriate. EEG data were recorded continuously and digitally stored.  Video monitoring  was available and reviewed as appropriate. Description: Patient was noted to have episodes of brief sudden eye opening with whole body jerking every few seconds.  Concomitant EEG showed generalized polyspikes consistent with myoclonic seizures.  In between seizures EEG showed generalized background suppression. Hyperventilation and photic stimulation were not performed.   ABNORMALITY -Myoclonic seizure, generalized -Background suppression, generalized IMPRESSION: Patient was noted to have myoclonic seizures every few seconds.  Additionally there was evidence of severe to profound diffuse encephalopathy.  In the setting of cardiac arrest, this EEG pattern is concerning for anoxic/hypoxic brain injury. Harlon Ditty, NP was notified. Charlsie Quest   DG Abd 1 View  Result Date: 02/23/2023 CLINICAL DATA:  57 year old male intubated.  Enteric tube placement. EXAM: ABDOMEN - 1 VIEW COMPARISON:  Portable chest reported separately at the same time. CT Abdomen and Pelvis 10/31/2010. FINDINGS: Portable AP supine view at 1140 hours. Enteric tube terminates in the proximal stomach, side hole is at the level of the GEJ. Paucity of bowel gas in the upper abdomen. Stable visible lower chest as reported separately. IMPRESSION: Enteric tube placed into the stomach but side hole is at the level of the GEJ. Advance 6 cm to ensure side hole placement within the stomach. Electronically Signed   By: Odessa Fleming M.D.   On: 02/07/2023 11:54   DG Chest Port 1 View  Result Date: 02/03/2023 CLINICAL DATA:  57 year old male intubated. EXAM: PORTABLE CHEST 1 VIEW COMPARISON:  Chest radiographs 06/21/2013, chest CT 08/23/2021 and earlier. FINDINGS: Portable AP supine view at 1138 hours. Endotracheal tube tip at the level the clavicles. Enteric tube courses to the abdomen, tip not included. Right IJ central line, dual lumen dialysis type with catheter tips extending to the lower SVC level. Superimposed left IJ approach single lumen  vascular catheter, tip projects at the confluence of the innominate veins. Mediastinal contours remain within normal limits. Coarse bilateral perihilar and interstitial opacity, slight upper lobe predominance. No pneumothorax or pleural effusion identified on this supine view. Chronic right clavicle fracture. IMPRESSION: 1. ETT tip at the level the clavicles. Enteric tube courses to the abdomen, tip not included. Right IJ dialysis type catheter tips at the lower SVC level. Left IJ single lumen catheter terminates at the confluence of the innominate veins. 2. No pneumothorax on this supine view. Coarse and confluent bilateral perihilar and interstitial  opacity is nonspecific. Top differential considerations include acute pulmonary edema, bilateral pneumonia. Electronically Signed   By: Odessa Fleming M.D.   On: 02/20/2023 11:53   CARDIAC CATHETERIZATION  Result Date: 02/04/2023   2nd Mrg lesion is 99% stenosed.   1st Mrg lesion is 75% stenosed.   Mid Cx to Dist Cx lesion is 90% stenosed.   Ost LAD to Prox LAD lesion is 99% stenosed.   Mid LAD lesion is 50% stenosed.   Prox RCA lesion is 30% stenosed.   Mid RCA lesion is 20% stenosed.   A drug-eluting stent was successfully placed using a STENT ONYX FRONTIER 3.0X08.   Post intervention, there is a 0% residual stenosis.   There is severe left ventricular systolic dysfunction.   LV end diastolic pressure is mildly elevated.   The left ventricular ejection fraction is 25-35% by visual estimate. 1.  Cardiac arrest 2.  Anterior STEMI 3.  Successful primary PCI with 3.0 x 8 mm Onyx frontier DES ostial LAD 4.  Residual 99% stenosis OM 2 which appears chronic and collateralized 5.  Severe dilated cardiomyopathy with estimated LV ejection fraction 25% 6.  Guarded prognosis Recommendations 1.  Continue IV cangrelor until patient can take oral platelet inhibitor 2.  Good medical management after extubation    Medications: Scheduled:  aspirin  81 mg Oral Daily   Chlorhexidine  Gluconate Cloth  6 each Topical Q0600   docusate  100 mg Per Tube BID   heparin injection (subcutaneous)  5,000 Units Subcutaneous Q8H   insulin aspart  0-6 Units Subcutaneous Q4H   mouth rinse  15 mL Mouth Rinse Q2H   pantoprazole (PROTONIX) IV  40 mg Intravenous Daily   polyethylene glycol  17 g Per Tube Daily   sodium chloride flush  3 mL Intravenous Q12H   sodium chloride flush  3 mL Intravenous Q12H   ticagrelor  90 mg Per Tube BID   Continuous:  sodium chloride     sodium chloride     cefTRIAXone (ROCEPHIN)  IV     fentaNYL infusion INTRAVENOUS 100 mcg/hr (02/19/23 1459)   levETIRAcetam Stopped (02/19/23 1154)   levETIRAcetam     midazolam 10 mg/hr (02/19/23 1300)   norepinephrine (LEVOPHED) Adult infusion 4 mcg/min (02/19/23 1300)   propofol (DIPRIVAN) infusion 25 mcg/kg/min (02/19/23 1300)    Assessment: 57 year old male who presented in cardiac arrest on Monday. Total ACLS time was estimated to be 28 minutes. Myoclonus and rhythmic facial fasciculations were noted by ICU team. Anoxic brain injury has been confirmed by MRI.   - Exam under sedation reveals an unresponsive patient with absent pupillary light reflex, absent oculocephalic reflex, absent corneals, no cough/gag reflex and no movement of the extremities to any stimuli. The intermittent low-amplitude myoclonic jerking of the face and lower legs that was seen yesterday is now resolved.  - CT head (11/25): Limited study due to presence of contrast from cardiac catheterization performed earlier today. Hypodense focus in the left side of the pons and left thalamus, concerning for infarct versus artifact. Correlation with MRI of the brain recommended.   - MRI brain (11/26 at 0120):  Findings consistent with widespread anoxic brain injury. Underlying chronic microvascular ischemic disease with remote lacunar infarcts at the left thalamus and pons. Few small foci of susceptibility artifact involving the left frontoparietal region.  While no visible hemorrhage is seen or these locations on prior head CT, that exam was somewhat limited due to the presence of IV contrast  material related to prior cardiac catheterization. Correlation with repeat noncontrast head CT suggested to evaluate for possible trace subarachnoid hemorrhage as warranted. - EEGs: - EEG for today (11/26): Burst suppression, generalized. This study is suggestive of profound diffuse encephalopathy.  No seizures are noted. - EEG (11/25): Patient was noted to have myoclonic seizures every few seconds. Additionally there was evidence of severe to profound diffuse encephalopathy. In the setting of cardiac arrest, this EEG pattern is concerning for anoxic/hypoxic brain injury. - Labs: - Elevated transaminases: AST 89, ALT 97.  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  - Obtain ammonia level and track LFTs in case addition of valproic acid to his regimen is needed.  - Supportive care and electrolyte management per CCM    40 minutes spent in the neurological evaluation and management of this critically ill patient       LOS: 1 day   @Electronically  signed: Dr. Caryl Pina 02/19/2023  3:16 PM

## 2023-02-19 NOTE — Progress Notes (Signed)
PHARMACY CONSULT NOTE - FOLLOW UP  Pharmacy Consult for Electrolyte Monitoring and Replacement   Recent Labs: Potassium (mmol/L)  Date Value  02/19/2023 4.3  06/21/2013 4.1   Magnesium (mg/dL)  Date Value  29/56/2130 1.6 (L)   Calcium (mg/dL)  Date Value  86/57/8469 8.1 (L)   Calcium, Total (mg/dL)  Date Value  62/95/2841 7.3 (L)   Albumin (g/dL)  Date Value  32/44/0102 2.7 (L)  02/19/2023 2.6 (L)  02/12/2022 3.9  06/21/2013 3.1 (L)   Phosphorus (mg/dL)  Date Value  72/53/6644 5.7 (H)   Sodium (mmol/L)  Date Value  02/19/2023 131 (L)  02/12/2022 130 (L)  06/21/2013 132 (L)   Corrected Ca: 8.7 mg/dL  Assessment: 57 y.o. male w/ PMH of  HTN, DM, ESRD s/p renal transplant currently on hemodialysis who presents to the emergency department in cardiac arrest. Patient is now on an insulin infusion  Goal of Therapy:  Potassium 4.0 - 5.1 mmol/L Magnesium 2.0 - 2.4 mg/dL All Other Electrolytes WNL  Plan:  ---2 grams IV magnesium sulfate x 1 ---recheck BMP and magnesium in am  Lowella Bandy ,PharmD Clinical Pharmacist 02/19/2023 7:13 AM

## 2023-02-19 NOTE — Progress Notes (Signed)
Eeg done 

## 2023-02-19 NOTE — TOC Initial Note (Signed)
Transition of Care [facility]) - Initial/Assessment Note    Patient Details  Name: James Moreno MRN: [id number] Date of Birth: 26-Jul-1965  Transition of Care [facility]) CM/SW Contact:    Chapman Fitch, RN Phone Number: 02/19/2023, 1:59 PM  Clinical Narrative:                  Patient noted with high risk for readmission score.  Per MD "multiorgan failure with a very high probablity of a very minimal chance of meaningful recovery " Toc to follow at this time        Patient Goals and CMS Choice            Expected Discharge Plan and Services                                              Prior Living Arrangements/Services                       Activities of Daily Living      Permission Sought/Granted                  Emotional Assessment              Admission diagnosis:  Acute ST elevation myocardial infarction (STEMI) involving left anterior descending (LAD) coronary artery (HCC) [I21.02] Patient Active Problem List   Diagnosis Date Noted   Acute ST elevation myocardial infarction (STEMI) involving left anterior descending (LAD) coronary artery (HCC) 02/03/2023   Coronary artery disease of native artery of native heart with stable angina pectoris (HCC) 06/21/2021   Chronic kidney disease, stage 3b (HCC) 06/21/2021   Chronic pain following surgery or procedure 11/15/2020   Proliferative retinopathy of left eye due to diabetes mellitus (HCC) 01/28/2019   Esophageal dysphagia    Benign essential hypertension 12/11/2018   Secondary hyperparathyroidism of renal origin (HCC) 12/11/2018   Severe tobacco use disorder 08/01/2018   Atherosclerosis of artery of extremity with ulceration (HCC) 07/23/2018   Gastroesophageal reflux disease 05/26/2018   PAD (peripheral artery disease) (HCC) 10/30/2017   Chronic ulcer of heel, right, with unspecified severity (HCC) 10/16/2017   Hepatitis B core antibody positive 07/03/2017   Hypertriglyceridemia  07/03/2017   Chronic, continuous use of opioids 03/28/2016   Anxiety 03/28/2016   Pain in surgical scar 11/07/2015   Bulging eyes 01/24/2015   Leg mass 01/24/2015   Renal transplant, status post 01/24/2015   Carotid arterial disease (HCC) 12/27/2014   Compulsive tobacco user syndrome 12/27/2014   Abnormal EKG 04/06/2013   Erectile dysfunction 11/29/2012   Obesity 11/28/2012   Type 2 diabetes mellitus with diabetic nephropathy (HCC) 11/28/2012   Hypertension 12/21/2011   Hyperlipidemia 12/21/2011   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 [place] [address] Phone: [phone] Fax: [phone]  [facility] - [place], [place] - [address] [place] [address] Phone: [phone] Fax: [phone]     Social Determinants of Health (SDOH) Social History: SDOH Screenings   Food Insecurity: Patient Unable To Answer (02/10/2023)  Housing: Patient Unable To Answer (01/25/2023)  Transportation Needs: Patient Unable To Answer (02/13/2023)  Utilities: Patient Unable To Answer (02/05/2023)  Alcohol Screen: Low Risk  (09/14/2022)  Depression (PHQ2-9): Low Risk  (09/14/2022)  Financial Resource Strain: Medium Risk (03/01/2022)  Physical Activity: Inactive (03/01/2022)  Social Connections: Moderately Integrated (03/01/2022)  Recent Concern: Social Connections -  Moderately Isolated (02/28/2022)  Stress: Stress Concern Present (03/01/2022)  Tobacco Use: High Risk (02/04/2023)   SDOH Interventions:     Readmission Risk Interventions     No data to display

## 2023-02-19 NOTE — Progress Notes (Signed)
[facility] was notified of GCS of 3. [facility] to continue to follow at this time. Hospital staff to notify with plans to withdraw of care, brain death testing, goals of care meetings, or changes in status.

## 2023-02-19 NOTE — Plan of Care (Signed)
  Problem: Education: Goal: Knowledge of General Education information will improve Description: Including pain rating scale, medication(s)/side effects and non-pharmacologic comfort measures Outcome: Not Progressing   Problem: Health Behavior/Discharge Planning: Goal: Ability to manage health-related needs will improve Outcome: Not Progressing   Problem: Clinical Measurements: Goal: Ability to maintain clinical measurements within normal limits will improve Outcome: Not Progressing Goal: Will remain free from infection Outcome: Not Progressing Goal: Diagnostic test results will improve Outcome: Not Progressing Goal: Respiratory complications will improve Outcome: Not Progressing Goal: Cardiovascular complication will be avoided Outcome: Not Progressing   Problem: Activity: Goal: Risk for activity intolerance will decrease Outcome: Not Progressing   Problem: Nutrition: Goal: Adequate nutrition will be maintained Outcome: Not Progressing   Problem: Coping: Goal: Level of anxiety will decrease Outcome: Not Progressing   Problem: Elimination: Goal: Will not experience complications related to bowel motility Outcome: Not Progressing Goal: Will not experience complications related to urinary retention Outcome: Not Progressing   Problem: Pain Management: Goal: General experience of comfort will improve Outcome: Not Progressing   Problem: Safety: Goal: Ability to remain free from injury will improve Outcome: Not Progressing   Problem: Skin Integrity: Goal: Risk for impaired skin integrity will decrease Outcome: Not Progressing   Problem: Activity: Goal: Ability to tolerate increased activity will improve Outcome: Not Progressing   Problem: Respiratory: Goal: Ability to maintain a clear airway and adequate ventilation will improve Outcome: Not Progressing   Problem: Role Relationship: Goal: Method of communication will improve Outcome: Not Progressing   Problem:  Education: Goal: Understanding of CV disease, CV risk reduction, and recovery process will improve Outcome: Not Progressing Goal: Individualized Educational Video(s) Outcome: Not Progressing   Problem: Activity: Goal: Ability to return to baseline activity level will improve Outcome: Not Progressing   Problem: Cardiovascular: Goal: Ability to achieve and maintain adequate cardiovascular perfusion will improve Outcome: Not Progressing Goal: Vascular access site(s) Level 0-1 will be maintained Outcome: Not Progressing   Problem: Health Behavior/Discharge Planning: Goal: Ability to safely manage health-related needs after discharge will improve Outcome: Not Progressing   Problem: Education: Goal: Ability to describe self-care measures that may prevent or decrease complications (Diabetes Survival Skills Education) will improve Outcome: Not Progressing Goal: Individualized Educational Video(s) Outcome: Not Progressing   Problem: Coping: Goal: Ability to adjust to condition or change in health will improve Outcome: Not Progressing   Problem: Fluid Volume: Goal: Ability to maintain a balanced intake and output will improve Outcome: Not Progressing   Problem: Health Behavior/Discharge Planning: Goal: Ability to identify and utilize available resources and services will improve Outcome: Not Progressing Goal: Ability to manage health-related needs will improve Outcome: Not Progressing   Problem: Metabolic: Goal: Ability to maintain appropriate glucose levels will improve Outcome: Not Progressing   Problem: Nutritional: Goal: Maintenance of adequate nutrition will improve Outcome: Not Progressing Goal: Progress toward achieving an optimal weight will improve Outcome: Not Progressing   Problem: Skin Integrity: Goal: Risk for impaired skin integrity will decrease Outcome: Not Progressing   Problem: Tissue Perfusion: Goal: Adequacy of tissue perfusion will improve Outcome:  Not Progressing

## 2023-02-19 NOTE — Progress Notes (Signed)
Rounding Note    Patient Name: James Moreno Date of Encounter: 02/19/2023  [facility] Cardiologist: Julien Nordmann, MD   Subjective   Intubated and sedated. BP stable. Concern for anoxic brain injury.  Inpatient Medications    Scheduled Meds:  amiodarone  150 mg Intravenous Once   aspirin  81 mg Oral Daily   Chlorhexidine Gluconate Cloth  6 each Topical Q0600   docusate  100 mg Per Tube BID   insulin aspart  0-6 Units Subcutaneous Q4H   mouth rinse  15 mL Mouth Rinse Q2H   pantoprazole (PROTONIX) IV  40 mg Intravenous Daily   polyethylene glycol  17 g Per Tube Daily   sodium chloride flush  3 mL Intravenous Q12H   sodium chloride flush  3 mL Intravenous Q12H   ticagrelor  90 mg Per Tube BID   Continuous Infusions:  sodium chloride     sodium chloride     amiodarone 30 mg/hr (02/19/23 0626)   cefTRIAXone (ROCEPHIN)  IV Stopped (02/04/2023 1419)   fentaNYL infusion INTRAVENOUS 100 mcg/hr (02/19/23 0626)   levETIRAcetam     levETIRAcetam     magnesium sulfate bolus IVPB 1 g (02/19/23 0841)   midazolam 10 mg/hr (02/19/23 0626)   norepinephrine (LEVOPHED) Adult infusion 6 mcg/min (02/19/23 0626)   propofol (DIPRIVAN) infusion 25 mcg/kg/min (02/19/23 0838)   PRN Meds: sodium chloride, sodium chloride, acetaminophen, alteplase, fentaNYL, heparin, levETIRAcetam, midazolam, mouth rinse, sodium chloride flush, sodium chloride flush   Vital Signs    Vitals:   02/19/23 0645 02/19/23 0655 02/19/23 0700 02/19/23 0800  BP:   120/60 119/60  Pulse: 75 75 76 75  Resp: 20 20 20 20   Temp:    99.2 F (37.3 C)  TempSrc:    Axillary  SpO2: 100% 100% 100% 100%  Weight:      Height:        Intake/Output Summary (Last 24 hours) at 02/19/2023 0931 Last data filed at 02/19/2023 0626 Gross per 24 hour  Intake 2588.57 ml  Output 745 ml  Net 1843.57 ml      02/19/2023    5:00 AM 02/13/2023    1:00 PM 02/04/2023    1:08 PM  Last 3 Weights  Weight (lbs) 221 lb  12.5 oz 218 lb 14.7 oz 200 lb  Weight (kg) 100.6 kg 99.3 kg 90.719 kg      Telemetry    Nsr PVCs - Personally Reviewed  ECG    No new - Personally Reviewed  Physical Exam   GEN: intubated and sedated.   Neck: No JVD Cardiac: RRR, no murmurs, rubs, or gallops.  Respiratory: Clear to auscultation bilaterally. GI: Soft, nontender, non-distended  MS: No edema; No deformity. Neuro:  Nonfocal  Psych: Normal affect   Labs    High Sensitivity Troponin:   Recent Labs  Lab 01/28/2023 0645 02/04/2023 1152 01/30/2023 1442 02/11/2023 1655  TROPONINIHS 382* 532* 460* 483*     Chemistry Recent Labs  Lab 02/06/2023 0645 01/25/2023 0810 01/31/2023 1152 02/22/2023 1442 02/12/2023 2244 02/19/23 0217 02/19/23 0358  NA 128*   < > 130*   < > 133* 131* 131*  K 3.4*   < > 6.2*   < > 3.6 4.1 4.3  CL 93*  --  95*   < > 97* 96* 96*  CO2 23  --  25   < > 26 23 23   GLUCOSE 621*  --  341*   < > 128* 150* 161*  BUN 39*  --  49*   < > 32* 35* 36*  CREATININE 4.36*  --  4.86*   < > 2.98* 3.52* 3.63*  CALCIUM 9.7  --  8.4*   < > 8.0* 8.0* 8.1*  MG 2.1  --   --   --   --   --  1.6*  PROT  --   --  5.0*  --   --   --  4.8*  ALBUMIN  --   --  2.8*  --   --   --  2.6*  2.7*  AST  --   --  89*  --   --   --  35  ALT  --   --  97*  --   --   --  64*  ALKPHOS  --   --  124  --   --   --  94  BILITOT  --   --  0.6  --   --   --  0.6  GFRNONAA 15*  --  13*   < > 24* 19* 19*  ANIONGAP 12  --  10   < > 10 12 12    < > = values in this interval not displayed.    Lipids  Recent Labs  Lab 02/19/23 0358  TRIG 189*    Hematology Recent Labs  Lab 02/23/2023 0645 02/10/2023 0810 01/26/2023 1152 02/19/23 0358  WBC 11.6*  --  19.8* 19.6*  RBC 3.07*  --  3.33* 3.01*  HGB 9.3* 10.9* 10.1* 9.2*  HCT 31.1* 32.0* 31.4* 27.6*  MCV 101.3*  --  94.3 91.7  MCH 30.3  --  30.3 30.6  MCHC 29.9*  --  32.2 33.3  RDW 14.7  --  14.6 14.7  PLT 181  --  224 177   Thyroid  Recent Labs  Lab 02/06/2023 1442  TSH 1.290     BNP Recent Labs  Lab 01/26/2023 0645  BNP 1,575.2*    DDimer No results for input(s): "DDIMER" in the last 168 hours.   Radiology    DG Chest Port 1 View  Result Date: 02/19/2023 CLINICAL DATA:  Acute respiratory failure, hypoxia EXAM: PORTABLE CHEST 1 VIEW COMPARISON:  02/11/2023 FINDINGS: Two frontal views of the chest demonstrate stable endotracheal tube, enteric catheter, and right internal jugular dialysis catheter. Left internal jugular catheter tip again projects over the brachiocephalic confluence. Cardiac silhouette is stable. There is persistent pulmonary vascular congestion, likely not significantly changed given differences in patient positioning and technique. No effusion or pneumothorax. No acute bony abnormalities. IMPRESSION: 1. Support devices as above. 2. Pulmonary vascular congestion, with no significant change in volume status. Electronically Signed   By: Sharlet Salina M.D.   On: 02/19/2023 09:04   MR BRAIN WO CONTRAST  Result Date: 02/19/2023 CLINICAL DATA:  Follow-up examination for stroke. EXAM: MRI HEAD WITHOUT CONTRAST TECHNIQUE: Multiplanar, multiecho pulse sequences of the brain and surrounding structures were obtained without intravenous contrast. COMPARISON:  Prior CT from 02/17/2023. FINDINGS: Brain: Cerebral volume within normal limits. Patchy T2/FLAIR hyperintensity involving the periventricular and deep white matter of both cerebral hemispheres as well as the pons, consistent with chronic small vessel ischemic disease. Remote lacunar infarcts present at the left thalamus and pons. Extensive diffusion signal abnormality seen involving the cortical gray matter of both cerebral hemispheres, slightly worse on the right. Symmetric involvement of the bilateral basal ganglia and thalami. Diffusion signal abnormality seen throughout the cerebellum. Associated T2/FLAIR signal abnormality  throughout the areas affected. Findings consistent with widespread anoxic brain injury.  No definite superimposed vascular infarct. Multiple scattered punctate foci of susceptibility artifact noted, consistent with small chronic micro hemorrhages, likely hypertensive in nature. Few small foci of susceptibility artifact noted within the left frontoparietal region. No visible acute hemorrhage seen at these locations on prior head CT. No mass lesion or midline shift. No hydrocephalus or extra-axial fluid collection. Pituitary gland and suprasellar region within normal limits. Vascular: Major intracranial vascular flow voids are maintained. Skull and upper cervical spine: Craniocervical junction within normal limits. Decreased T1 signal intensity seen within the visualized bone marrow, nonspecific, but most commonly related to anemia, smoking, or obesity. No scalp soft tissue abnormality. Sinuses/Orbits: Prior ocular lens replacement on the right. Scattered mucosal thickening present throughout the paranasal sinuses. Trace bilateral mastoid effusions. Patient is intubated. Other: None. IMPRESSION: 1. Findings consistent with widespread anoxic brain injury as detailed above. 2. Underlying chronic microvascular ischemic disease with remote lacunar infarcts at the left thalamus and pons. 3. Few small foci of susceptibility artifact involving the left frontoparietal region. While no visible hemorrhage is seen or these locations on prior head CT, that exam was somewhat limited due to the presence of IV contrast material related to prior cardiac catheterization. Correlation with repeat noncontrast head CT suggested to evaluate for possible trace subarachnoid hemorrhage as warranted. Electronically Signed   By: Rise Mu M.D.   On: 02/19/2023 06:54   CT HEAD WO CONTRAST ( )  Result Date: 01/31/2023 CLINICAL DATA:  Mental status change, unknown cause. EXAM: CT HEAD WITHOUT CONTRAST TECHNIQUE: Contiguous axial images were obtained from the base of the skull through the vertex without intravenous  contrast. RADIATION DOSE REDUCTION: This exam was performed according to the departmental dose-optimization program which includes automated exposure control, adjustment of the mA and/or kV according to patient size and/or use of iterative reconstruction technique. COMPARISON:  Head CT February 17, 2019. FINDINGS: Brain: Although no intravenous contrast was administered to obtain this study, the images are enhance by contrast administered during cardiac catheterization performed earlier today. This evaluation for possible subarachnoid hemorrhage. Limits hypodense focus is seen on the left side of the pons, concerning for infarct versus artifact. A hypodense focus is also seen in the left thalamus. No large acute territorial infarct identified. No hydrocephalus, extra-axial collection or mass lesion/mass effect. Vascular: Calcified plaques in the bilateral carotid siphons. Skull: Normal. Negative for fracture or focal lesion. Sinuses/Orbits: Fluid within the paranasal sinuses and within the nasopharynx and oropharynx related to presence of endotracheal tube. The orbits are maintained. Other: Soft tissue swelling on the right side of the face may be posttraumatic. IMPRESSION: 1. Limited study due to presence of contrast from cardiac catheterization performed earlier today. 2. Hypodense focus in the left side of the pons and left thalamus, concerning for infarct versus artifact. Correlation with MRI of the brain recommended. 3. Soft tissue swelling on the right side of the face may be posttraumatic. Electronically Signed   By: Baldemar Lenis M.D.   On: 02/01/2023 16:28   EEG adult  Result Date: 02/11/2023 Charlsie Quest, MD     01/26/2023  1:40 PM Patient Name: James Moreno MRN: [id number] Epilepsy Attending: Charlsie Quest Referring Physician/Provider: Judithe Modest, NP Date: 02/13/2023 Duration: 25.50 mins Patient history: 57 year old male status post cardiac arrest.  EEG evaluate for  seizure. Level of alertness: comatose AEDs during EEG study: LEV, versed Technical aspects: This EEG study was done  with scalp electrodes positioned according to the 10-20 International system of electrode placement. Electrical activity was reviewed with band pass filter of 1-70Hz , sensitivity of 7 uV/mm, display speed of 75mm/sec with a 60Hz  notched filter applied as appropriate. EEG data were recorded continuously and digitally stored.  Video monitoring was available and reviewed as appropriate. Description: Patient was noted to have episodes of brief sudden eye opening with whole body jerking every few seconds.  Concomitant EEG showed generalized polyspikes consistent with myoclonic seizures.  In between seizures EEG showed generalized background suppression. Hyperventilation and photic stimulation were not performed.   ABNORMALITY -Myoclonic seizure, generalized -Background suppression, generalized IMPRESSION: Patient was noted to have myoclonic seizures every few seconds.  Additionally there was evidence of severe to profound diffuse encephalopathy.  In the setting of cardiac arrest, this EEG pattern is concerning for anoxic/hypoxic brain injury. Harlon Ditty, NP was notified. Charlsie Quest   DG Abd 1 View  Result Date: 01/27/2023 CLINICAL DATA:  57 year old male intubated.  Enteric tube placement. EXAM: ABDOMEN - 1 VIEW COMPARISON:  Portable chest reported separately at the same time. CT Abdomen and Pelvis 10/31/2010. FINDINGS: Portable AP supine view at 1140 hours. Enteric tube terminates in the proximal stomach, side hole is at the level of the GEJ. Paucity of bowel gas in the upper abdomen. Stable visible lower chest as reported separately. IMPRESSION: Enteric tube placed into the stomach but side hole is at the level of the GEJ. Advance 6 cm to ensure side hole placement within the stomach. Electronically Signed   By: Odessa Fleming M.D.   On: 02/01/2023 11:54   DG Chest Port 1 View  Result Date:  01/30/2023 CLINICAL DATA:  58 year old male intubated. EXAM: PORTABLE CHEST 1 VIEW COMPARISON:  Chest radiographs 06/21/2013, chest CT 08/23/2021 and earlier. FINDINGS: Portable AP supine view at 1138 hours. Endotracheal tube tip at the level the clavicles. Enteric tube courses to the abdomen, tip not included. Right IJ central line, dual lumen dialysis type with catheter tips extending to the lower SVC level. Superimposed left IJ approach single lumen vascular catheter, tip projects at the confluence of the innominate veins. Mediastinal contours remain within normal limits. Coarse bilateral perihilar and interstitial opacity, slight upper lobe predominance. No pneumothorax or pleural effusion identified on this supine view. Chronic right clavicle fracture. IMPRESSION: 1. ETT tip at the level the clavicles. Enteric tube courses to the abdomen, tip not included. Right IJ dialysis type catheter tips at the lower SVC level. Left IJ single lumen catheter terminates at the confluence of the innominate veins. 2. No pneumothorax on this supine view. Coarse and confluent bilateral perihilar and interstitial opacity is nonspecific. Top differential considerations include acute pulmonary edema, bilateral pneumonia. Electronically Signed   By: Odessa Fleming M.D.   On: 02/12/2023 11:53   CARDIAC CATHETERIZATION  Result Date: 02/15/2023   2nd Mrg lesion is 99% stenosed.   1st Mrg lesion is 75% stenosed.   Mid Cx to Dist Cx lesion is 90% stenosed.   Ost LAD to Prox LAD lesion is 99% stenosed.   Mid LAD lesion is 50% stenosed.   Prox RCA lesion is 30% stenosed.   Mid RCA lesion is 20% stenosed.   A drug-eluting stent was successfully placed using a STENT ONYX FRONTIER 3.0X08.   Post intervention, there is a 0% residual stenosis.   There is severe left ventricular systolic dysfunction.   LV end diastolic pressure is mildly elevated.   The left ventricular ejection fraction  is 25-35% by visual estimate. 1.  Cardiac arrest 2.   Anterior STEMI 3.  Successful primary PCI with 3.0 x 8 mm Onyx frontier DES ostial LAD 4.  Residual 99% stenosis OM 2 which appears chronic and collateralized 5.  Severe dilated cardiomyopathy with estimated LV ejection fraction 25% 6.  Guarded prognosis Recommendations 1.  Continue IV cangrelor until patient can take oral platelet inhibitor 2.  Good medical management after extubation    Cardiac Studies   LHC 02/16/2023    2nd Mrg lesion is 99% stenosed.   1st Mrg lesion is 75% stenosed.   Mid Cx to Dist Cx lesion is 90% stenosed.   Ost LAD to Prox LAD lesion is 99% stenosed.   Mid LAD lesion is 50% stenosed.   Prox RCA lesion is 30% stenosed.   Mid RCA lesion is 20% stenosed.   A drug-eluting stent was successfully placed using a STENT ONYX FRONTIER 3.0X08.   Post intervention, there is a 0% residual stenosis.   There is severe left ventricular systolic dysfunction.   LV end diastolic pressure is mildly elevated.   The left ventricular ejection fraction is 25-35% by visual estimate.   1.  Cardiac arrest 2.  Anterior STEMI 3.  Successful primary PCI with 3.0 x 8 mm Onyx frontier DES ostial LAD 4.  Residual 99% stenosis OM 2 which appears chronic and collateralized 5.  Severe dilated cardiomyopathy with estimated LV ejection fraction 25% 6.  Guarded prognosis   Recommendations   1.  Continue IV cangrelor until patient can take oral platelet inhibitor 2.  Good medical management after extubation    Patient Profile     57 y.o. male with h/o transplant in 2016 with recurrent renal failure on PD converted to HD.  Assessment & Plan    STEMI Cardiac Arrest - presented with out of hospital cardiac arrest with CPR for 15 minutes. EKG showed anterolateral STE. - HS trop peak 532 - emergent cath showed subtotal occlusion of the mid left Cx which appeared chronic. He was treated with PCI/DES to the ostial LAD - EF noted to be 20-25% on heart cath - he had myoclonic activities  post-procedure - loaded with Brilinta - started on IV amio for possible ventricular arrhythmias>can d/c - found to be cocaine + - remains intubated and sedated on Levophed - continue Aspirin and Brilinta  Acute hypoxic respiratory failure on Vent support AMS Anoxic brain injury - Brain MRI and EEG showed anoxic brain injury - overall poor prognosis for meaningful recovery - neurology is following - palliative care has been consulted  Chronic systolic heart failure ICM - EF 20-25% by cath - echo pending - appears euvolemic  ESRD on HD - per nephrology  DM2 - per CCM   For questions or updates, please contact [facility] Please consult [email or web address] for contact info under        Signed, Jonh Mcqueary David Stall, PA-C  02/19/2023, 9:31 AM

## 2023-02-19 NOTE — Progress Notes (Signed)
Initial Nutrition Assessment  DOCUMENTATION CODES:   Obesity unspecified  INTERVENTION:   Vital 1.5@55ml /hr- Initiate at 63ml/hr and increase by 49ml/hr q 8 hours until goal rate is reached.   ProSource TF 20- Give 60ml BID via tube, each supplement provides 80kcal and 20g of protein.   Free water flushes 30ml q4 hours to maintain tube patency   Regimen provides 2140kcal/day, 129g/day protein and 1165ml/day of free water.   Pt at high refeed risk; recommend monitor potassium, magnesium and phosphorus labs daily until stable  Daily weights   NUTRITION DIAGNOSIS:   Inadequate oral intake related to inability to eat (pt sedated and ventilated) as evidenced by NPO status.  GOAL:   Provide needs based on ASPEN/SCCM guidelines  MONITOR:   Vent status, Labs, Weight trends, TF tolerance, I & O's, Skin  REASON FOR ASSESSMENT:   Ventilator    ASSESSMENT:   57 y/o male with h/o cocaine use, etoh abuse (in remission), HTN, HLD, DM, anxiety, OSA, PAD, GERD, esophageal dysphagia, ESRD s/p renal transplantation and hepatitis B who is admitted with STEMI, cardiac arrest and ABI.  Pt sedated and ventilated. OGT in place but needed advancement; KUB pending. Will plan to initiate tube feeds today. Pt is likely at refeed risk. Per chart, pt appears fairly weight stable at baseline. Pt with concerns for ABI; family deciding on GOC.    Medications reviewed and include: aspirin, colace, heparin, insulin, protonix, miralax, brilinta, ceftriaxone, levophed, propofol   Labs reviewed: Na 131(L), K 4.3 wnl, BUN 36(H), creat 3.63(H), P 5.7(H), Mg 1.6(L) Wbc- 19.6(H), Hgb 9.2(L), Hct 27.6(L) Cbgs- 167, 165, 160, 149 x 24 hrs  AIC 6.7(H)- 11/25  Patient is currently intubated on ventilator support MV: 10 L/min Temp (24hrs), Avg:97.7 F (36.5 C), Min:95.4 F (35.2 C), Max:99.2 F (37.3 C)  Propofol: 14.9 ml/hr- provides 393kcal/day   MAP- >81mmHg   UOP-   NUTRITION - FOCUSED  PHYSICAL EXAM:  Flowsheet Row Most Recent Value  Orbital Region No depletion  Upper Arm Region No depletion  Thoracic and Lumbar Region No depletion  Buccal Region No depletion  Temple Region No depletion  Clavicle Bone Region No depletion  Clavicle and Acromion Bone Region No depletion  Scapular Bone Region No depletion  Dorsal Hand No depletion  Patellar Region No depletion  Anterior Thigh Region No depletion  Posterior Calf Region No depletion  Edema (RD Assessment) Moderate  Hair Reviewed  Eyes Reviewed  Mouth Reviewed  Skin Reviewed  Nails Reviewed   Diet Order:   Diet Order             Diet NPO time specified  Diet effective now                   EDUCATION NEEDS:   No education needs have been identified at this time  Skin:  Skin Assessment: Reviewed RN Assessment  Last BM:  PTA  Height:   Ht Readings from Last 1 Encounters:  01/31/2023 5' 10.98" (1.803 m)    Weight:   Wt Readings from Last 1 Encounters:  02/19/23 100.6 kg    Ideal Body Weight:  78 kg  BMI:  Body mass index is 30.95 kg/m.  Estimated Nutritional Needs:   Kcal:  2107kcal/day  Protein:  130-150g/day  Fluid:  2.4-2.7L/day  Betsey Holiday MS, RD, LDN Please refer to [facility] for RD and/or RD on-call/weekend/after hours pager

## 2023-02-19 NOTE — Discharge Planning (Signed)
ESTABLISHED HEMODIALYSIS Outpatient Facility [facility]  [address] [place], [place] [address] [phone]  Schedule: TTS 6:00am   Dimas Chyle Dialysis Coordinator II  [facility] Cell: [phone] eFax: [phone] [email or web address]

## 2023-02-19 NOTE — IPAL (Signed)
  Interdisciplinary Goals of Care Family Meeting   Date carried out: 02/19/2023  Location of the meeting: Bedside  Member's involved: Physician, Nurse Practitioner, Bedside Registered Nurse, and Family Member or next of kin    GOALS OF CARE DISCUSSION  The Clinical status was relayed to family in detail- Daughter and Fiance at bedside  Updated and notified of patients medical condition- Patient remains unresponsive and will not open eyes to command.   Patient with increased WOB and using accessory muscles to breathe Explained to family course of therapy and the modalities   Patient with Progressive multiorgan failure with a very high probablity of a very minimal chance of meaningful recovery despite all aggressive and optimal medical therapy.   PATIENT REMAINS FULL CODE  Family understands the situation. Severe Brain Damage due to cocaine and cardiac arrest Multiorgan failure Very poor chance of meaningful recovery Await Neuro assessment and palliative care consultation   Family are satisfied with Plan of action and management. All questions answered  Additional CC time 35 mins   Ambyr Qadri Santiago Glad, M.D.  [facility]  Medical Director [facility] Medical Director [facility] Cardio-Pulmonary Department

## 2023-02-20 ENCOUNTER — Inpatient Hospital Stay
Admit: 2023-02-20 | Discharge: 2023-02-20 | Disposition: A | Discharge status: Home / Self Care | Payer: Medicare HMO | Attending: Cardiology | Admitting: Cardiology

## 2023-02-20 ENCOUNTER — Ambulatory Visit: Payer: Medicare HMO

## 2023-02-20 ENCOUNTER — Other Ambulatory Visit: Payer: Self-pay

## 2023-02-20 ENCOUNTER — Inpatient Hospital Stay: Admit: 2023-02-20 | Payer: Medicare HMO

## 2023-02-20 ENCOUNTER — Other Ambulatory Visit: Payer: Medicare HMO

## 2023-02-20 DIAGNOSIS — I420 Dilated cardiomyopathy: Secondary | ICD-10-CM

## 2023-02-20 DIAGNOSIS — G931 Anoxic brain damage, not elsewhere classified: Secondary | ICD-10-CM

## 2023-02-20 DIAGNOSIS — I213 ST elevation (STEMI) myocardial infarction of unspecified site: Secondary | ICD-10-CM | POA: Diagnosis not present

## 2023-02-20 DIAGNOSIS — Z515 Encounter for palliative care: Secondary | ICD-10-CM | POA: Diagnosis not present

## 2023-02-20 DIAGNOSIS — I2102 ST elevation (STEMI) myocardial infarction involving left anterior descending coronary artery: Secondary | ICD-10-CM | POA: Diagnosis not present

## 2023-02-20 DIAGNOSIS — J9601 Acute respiratory failure with hypoxia: Secondary | ICD-10-CM | POA: Diagnosis not present

## 2023-02-20 DIAGNOSIS — R569 Unspecified convulsions: Secondary | ICD-10-CM

## 2023-02-20 DIAGNOSIS — I469 Cardiac arrest, cause unspecified: Secondary | ICD-10-CM | POA: Diagnosis not present

## 2023-02-20 DIAGNOSIS — Z7189 Other specified counseling: Secondary | ICD-10-CM

## 2023-02-20 LAB — CBC
HCT: 24.4 % — ABNORMAL LOW (ref 39.0–52.0)
Hemoglobin: 8 g/dL — ABNORMAL LOW (ref 13.0–17.0)
MCH: 30.7 pg (ref 26.0–34.0)
MCHC: 32.8 g/dL (ref 30.0–36.0)
MCV: 93.5 fL (ref 80.0–100.0)
Platelets: 152 10*3/uL (ref 150–400)
RBC: 2.61 MIL/uL — ABNORMAL LOW (ref 4.22–5.81)
RDW: 14.9 % (ref 11.5–15.5)
WBC: 13.1 10*3/uL — ABNORMAL HIGH (ref 4.0–10.5)
nRBC: 0 % (ref 0.0–0.2)

## 2023-02-20 LAB — RENAL FUNCTION PANEL
Albumin: 2.3 g/dL — ABNORMAL LOW (ref 3.5–5.0)
Anion gap: 11 (ref 5–15)
BUN: 47 mg/dL — ABNORMAL HIGH (ref 6–20)
CO2: 24 mmol/L (ref 22–32)
Calcium: 7.9 mg/dL — ABNORMAL LOW (ref 8.9–10.3)
Chloride: 97 mmol/L — ABNORMAL LOW (ref 98–111)
Creatinine, Ser: 4.79 mg/dL — ABNORMAL HIGH (ref 0.61–1.24)
GFR, Estimated: 13 mL/min — ABNORMAL LOW (ref >60)
Glucose, Bld: 199 mg/dL — ABNORMAL HIGH (ref 70–99)
Phosphorus: 6.4 mg/dL — ABNORMAL HIGH (ref 2.5–4.6)
Potassium: 4 mmol/L (ref 3.5–5.1)
Sodium: 132 mmol/L — ABNORMAL LOW (ref 135–145)

## 2023-02-20 LAB — GLUCOSE, CAPILLARY
Glucose-Capillary: 172 mg/dL — ABNORMAL HIGH (ref 70–99)
Glucose-Capillary: 173 mg/dL — ABNORMAL HIGH (ref 70–99)
Glucose-Capillary: 195 mg/dL — ABNORMAL HIGH (ref 70–99)
Glucose-Capillary: 210 mg/dL — ABNORMAL HIGH (ref 70–99)
Glucose-Capillary: 212 mg/dL — ABNORMAL HIGH (ref 70–99)
Glucose-Capillary: 254 mg/dL — ABNORMAL HIGH (ref 70–99)

## 2023-02-20 LAB — MAGNESIUM: Magnesium: 2 mg/dL (ref 1.7–2.4)

## 2023-02-20 LAB — LIPOPROTEIN A (LPA): Lipoprotein (a): 49.7 nmol/L — ABNORMAL HIGH (ref <75.0)

## 2023-02-20 LAB — HEPATITIS B SURFACE ANTIBODY, QUANTITATIVE: Hep B S AB Quant (Post): 141 m[IU]/mL

## 2023-02-20 LAB — AMMONIA: Ammonia: 15 umol/L (ref 9–35)

## 2023-02-20 MED ORDER — RENA-VITE PO TABS
1.0000 | ORAL_TABLET | Freq: Every day | ORAL | Status: DC
  Administered 2023-02-20: 1
  Filled 2023-02-20: qty 1

## 2023-02-20 MED ORDER — LEVETIRACETAM IN NACL 500 MG/100ML IV SOLN
500.0000 mg | Freq: Once | INTRAVENOUS | Status: DC
  Filled 2023-02-20: qty 100

## 2023-02-20 MED ORDER — ASPIRIN 81 MG PO CHEW
81.0000 mg | CHEWABLE_TABLET | Freq: Every day | ORAL | Status: DC
  Administered 2023-02-21: 81 mg
  Filled 2023-02-20: qty 1

## 2023-02-20 MED ORDER — HEPARIN SODIUM (PORCINE) 1000 UNIT/ML DIALYSIS: 1000.0000 [IU] | INTRAMUSCULAR | Status: DC | PRN

## 2023-02-20 NOTE — Progress Notes (Signed)
Propofol and Versed off at 1050am per [place] clock. EEG done.

## 2023-02-20 NOTE — Progress Notes (Signed)
Pt completed hemodialysis treatment without issue. Pt goal met.  02/20/23 1530  Vitals  Temp 98.9 F (37.2 C)  BP (!) 144/64  BP Location Right Arm  BP Method Automatic  Patient Position (if appropriate) Lying  Oxygen Therapy  O2 Device Ventilator  During Treatment Monitoring  Intra-Hemodialysis Comments Tx completed  Post Treatment  Dialyzer Clearance Lightly streaked  Hemodialysis Intake (mL) 0 mL  Liters Processed 74  Fluid Removed (mL) 2000 mL  Tolerated HD Treatment Yes  Post-Hemodialysis Comments Pt goal met.  Hemodialysis Catheter Right Internal jugular Double lumen Permanent (Tunneled)  Placement Date/Time: 02/04/23 1405   Serial / Lot #: [id number]  Expiration Date: 12/23/24  Time Out: Correct patient;Correct procedure;Correct site  Maximum sterile barrier precautions: Hand hygiene;Large sterile sheet;Sterile probe cover;Cap;Mask;St...  Site Condition No complications  Blue Lumen Status Heparin locked  Red Lumen Status Heparin locked  Catheter fill solution Heparin 1000 units/ml  Catheter fill volume (Arterial) 2 cc  Catheter fill volume (Venous) 2  Dressing Type Gauze/Drain sponge;Transparent  Dressing Status Antimicrobial disc in place;Clean, Dry, Intact  Interventions New dressing  Drainage Description None  Dressing Change Due 02/27/23  Post treatment catheter status Capped and Clamped

## 2023-02-20 NOTE — Progress Notes (Signed)
   Patient Name: James Moreno Date of Encounter: 02/20/2023 [facility] Cardiologist: Julien Nordmann, MD   Interval Summary  .    Patient seen on AM rounds. Remains intubated and sedated. Daughter remains at the bedside. Remains on norepinephrine infusion.  Vital Signs .    Vitals:   02/20/23 1204 02/20/23 1210 02/20/23 1215 02/20/23 1230  BP: (!) 158/49 (!) 161/51 (!) 160/51 (!) 150/49  Pulse: 78 78 80 78  Resp: 20 20 20 20   Temp: (!) 101.3 F (38.5 C)     TempSrc: Axillary     SpO2:   93% 93%  Weight:      Height:        Intake/Output Summary (Last 24 hours) at 02/20/2023 1249 Last data filed at 02/20/2023 0725 Gross per 24 hour  Intake 1228.25 ml  Output 417 ml  Net 811.25 ml      02/20/2023    5:00 AM 02/19/2023    5:00 AM 02/09/2023    1:00 PM  Last 3 Weights  Weight (lbs) 225 lb 1.4 oz 221 lb 12.5 oz 218 lb 14.7 oz  Weight (kg) 102.1 kg 100.6 kg 99.3 kg      Telemetry/ECG    Sinus rates 60-70 - Personally Reviewed  Physical Exam .   GEN: Intubated and sedated Neck: Unable to assess JVD due to body position and tube holder Cardiac: RRR, no murmurs, rubs, or gallops.  Respiratory: Coarse to auscultation bilaterally. Respirations are vent assisted. GI: Soft, nontender, non-distended  MS: No edema  Assessment & Plan .     STEMI/cardiac arrest -Presented with out-of-hospital cardiac arrest with CPR for 15 minutes -EKG revealed anterolateral STEMI -High-sensitivity troponin peaked at 532 -Emergent left heart catheterization revealed subtotal occlusion of the mid left circumflex which appeared chronic -Treated with successful PCI/DES to the ostial LAD -EF noted to be 20-25% heart catheterization with echocardiogram on 15 -Continue with aspirin and Brilinta -Found to be cocaine positive on UDS -Initially was on IV amiodarone which has since been discontinued -Continued on further vasopressor management of norepinephrine per CCM -Continue  on telemetry monitoring -EKG as needed for changes -Can consider adding statin therapy in the future if he makes meaningful full neurologic recovery currently has been deferred due to mild transaminitis  Acute hypoxic respiratory failure/AMS/anoxic brain injury -Brain MRI and EEG showed anoxic brain injury -Overall poor prognosis for meaningful recovery -Palliative care has been consulted -Neurology continues to follow -Supportive care -Continue management per CCM  Chronic HFrEF/ICM -EF 20 to 25% by cath -Appears euvolemic on exam -Echocardiogram pending with recommendations to follow -Daily weights, strict I's and O's -Escalation of GDMT is limited by shock /blood pressure requiring vasopressors and end-stage renal disease  ESRD on hemodialysis -Per nephrology  Type 2 diabetes -Per CCM For questions or updates, please contact [facility] Please consult [email or web address] for contact info under        Signed, Wynema Garoutte, NP

## 2023-02-20 NOTE — Procedures (Signed)
Patient Name: JAYCIEON VALDERRAMA  MRN: [id number]  Epilepsy Attending: Charlsie Quest  Referring Physician/Provider: Caryl Pina, MD  Date: 02/20/2023 Duration: 34.49 mins   Patient history: 57 year old male status post cardiac arrest.  EEG evaluate for seizure.   Level of alertness: comatose   AEDs during EEG study: LEV   Technical aspects: This EEG study was done with scalp electrodes positioned according to the 10-20 International system of electrode placement. Electrical activity was reviewed with band pass filter of 1-70Hz , sensitivity of 7 uV/mm, display speed of 40mm/sec with a 60Hz  notched filter applied as appropriate. EEG data were recorded continuously and digitally stored.  Video monitoring was available and reviewed as appropriate.   Description: EEG showed continuous generalized background suppression. Hyperventilation and photic stimulation were not performed.     EKG artifact was seen throughout the study   ABNORMALITY - Background suppression, generalized   IMPRESSION: This study was suggestive of profound diffuse encephalopathy.  No seizures were noted.  EEG appears to be worsening compared to earlier EEG today.  Dr Otelia Limes was notified.   Ambriana Selway Annabelle Harman

## 2023-02-20 NOTE — Progress Notes (Signed)
Subjective: Off all sedation except for fentanyl gtt at a rate of 100.   Objective: Current vital signs: BP 125/62   Pulse 83   Temp (!) 101.3 F (38.5 C) (Axillary)   Resp 20   Ht 5' 10.98" (1.803 m)   Wt 102.1 kg   SpO2 97%   BMI 31.41 kg/m  Vital signs in last 24 hours: Temp:  [98.1 F (36.7 C)-101.3 F (38.5 C)] 101.3 F (38.5 C) (11/27 1138) Pulse Rate:  [74-83] 83 (11/27 0800) Resp:  [18-20] 20 (11/27 0800) BP: (103-125)/(50-63) 125/62 (11/27 0800) SpO2:  [93 %-100 %] 97 % (11/27 0800) Arterial Line BP: (112-158)/(43-55) 138/43 (11/27 0800) FiO2 (%):  [40 %] 40 % (11/27 0347) Weight:  [102.1 kg] 102.1 kg (11/27 0500)  Intake/Output from previous day: 11/26 0701 - 11/27 0700 In: 1667 [I.V.:982.8; NG/GT:384.2; IV Piggyback:300] Out: 497 [Urine:497] Intake/Output this shift: Total I/O In: 75.8 [I.V.:40.2; NG/GT:35.6] Out: -  Nutritional status:  Diet Order             Diet NPO time specified  Diet effective now                  Physical Exam HEENT- [place]/AT. No meningismus.    Lungs- Intubated Extremities- Noncyanotic  Neurologic Exam: Examined at 5:00 PM, 6 hours after discontinuation of propofol and Versed.  Mental Status: Intubated and sedated on fentanyl at a rate of 100. No eye opening spontaneously or to stimulation. Not responding to voice. No attempts to communicate. No limb withdrawal to noxious stimuli. No posturing noted.   Cranial Nerves: II: No blink to threat. Left pupil 2 mm and unreactive. Right pupil ovoid, approximately 2 x 4 mm and unreactive. III,IV, VI: Eyes will remain open after passive opening by examiner. Eyes are midline and conjugate. Absent oculocephalic reflex. No nystagmus.  V: Absent corneal reflexes bilaterally . VII: Flaccidly symmetric. No twitching seen.  VIII: No response to voice IX,X: Intubated XI: Head is midline XII: Unable to assess Motor/Sensory: BUE: Flaccid tone. No posturing, withdrawal or other movement to  noxious.  BLE: Flaccid tone. No posturing, withdrawal or other movement to noxious.  No myoclonic jerking seen.   Deep Tendon Reflexes: Hypoactive x 4  Cerebellar: Unable to assess Gait: Unable to assess    Lab Results: Results for orders placed or performed during the hospital encounter of 02/22/2023 (from the past 48 hour(s))  Lactic acid, plasma     Status: None   Collection Time: 02/09/2023  1:10 PM  Result Value Ref Range   Lactic Acid, Venous 1.5 0.5 - 1.9 mmol/L    Comment: Performed at [facility], [address] [place], [place] [address]  Culture, blood (Routine X 2) w Reflex to ID Panel     Status: None (Preliminary result)   Collection Time: 02/23/2023  1:12 PM   Specimen: BLOOD RIGHT HAND  Result Value Ref Range   Specimen Description BLOOD RIGHT HAND    Special Requests      Blood Culture results may not be optimal due to an excessive volume of blood received in culture bottles   Culture      NO GROWTH 2 DAYS Performed at [facility], [address] [place], [place] [address]    Report Status PENDING   Culture, blood (Routine X 2) w Reflex to ID Panel     Status: None (Preliminary result)   Collection Time: 01/26/2023  1:22 PM   Specimen: A-Line; Blood  Result Value Ref Range   Specimen Description A-LINE    Special Requests BCAV    Culture      NO GROWTH 2 DAYS Performed at [facility], [address] [place], [place] [address]    Report Status PENDING   Basic metabolic panel     Status: Abnormal   Collection Time: 02/20/2023  2:42 PM  Result Value Ref Range   Sodium 131 (L) 135 - 145 mmol/L   Potassium 5.4 (H) 3.5 - 5.1 mmol/L   Chloride 99 98 - 111 mmol/L   CO2 24 22 - 32 mmol/L   Glucose, Bld 300 (H) 70 - 99 mg/dL    Comment: Glucose reference range applies only to samples taken after fasting for at least 8 hours.   BUN 52 (H) 6 - 20 mg/dL   Creatinine, Ser 8.46 (H) 0.61 - 1.24 mg/dL   Calcium 7.7 (L) 8.9 - 10.3 mg/dL    GFR, Estimated 14 (L) >60 mL/min    Comment: (NOTE) Calculated using the CKD-EPI Creatinine Equation (2021)    Anion gap 8 5 - 15    Comment: Performed at [facility], [address] [place], [place] [address]  Thyroid Panel With TSH     Status: None   Collection Time: 02/15/2023  2:42 PM  Result Value Ref Range   TSH 1.290 0.450 - 4.500 uIU/mL   T4, Total 6.1 4.5 - 12.0 ug/dL   T3 Uptake Ratio 37 24 - 39 %   Free Thyroxine Index 2.3 1.2 - 4.9    Comment: (NOTE) Performed At: [facility] [address] [place], [place] [id number] Jolene Schimke MD NU:[id number]   Troponin I (High Sensitivity)     Status: Abnormal   Collection Time: 02/13/2023  2:42 PM  Result Value Ref Range   Troponin I (High Sensitivity) 460 (HH) <18 ng/L    Comment: CRITICAL VALUE NOTED. VALUE IS CONSISTENT WITH PREVIOUSLY REPORTED/CALLED VALUE MU (NOTE) Elevated high sensitivity troponin I (hsTnI) values and significant  changes across serial measurements may suggest ACS but many other  chronic and acute conditions are known to elevate hsTnI results.  Refer to the "Links" section for chest pain algorithms and additional  guidance. Performed at [facility], [address] [place], [place] [address]   Cooxemetry Panel (carboxy, met, total hgb, O2 sat)     Status: Abnormal   Collection Time: 02/02/2023  3:12 PM  Result Value Ref Range   Total hemoglobin 9.8 (L) 12.0 - 16.0 g/dL   O2 Saturation 59.5 %   Carboxyhemoglobin 1.9 (H) 0.5 - 1.5 %   Methemoglobin <0.7 0.0 - 1.5 %   Total oxygen content 79.5 %    Comment: Performed at [facility], [address] [place], [place] [address]  Glucose, capillary     Status: Abnormal   Collection Time: 01/27/2023  3:41 PM  Result Value Ref Range   Glucose-Capillary 266 (H) 70 - 99 mg/dL    Comment: Glucose reference range applies only to samples taken after fasting for at least 8 hours.  Glucose, capillary     Status: Abnormal    Collection Time: 02/13/2023  4:49 PM  Result Value Ref Range   Glucose-Capillary 196 (H) 70 - 99 mg/dL    Comment: Glucose reference range applies only to samples taken after fasting for at least 8 hours.  Troponin I (High Sensitivity)     Status: Abnormal   Collection Time: 02/13/2023  4:55 PM  Result Value Ref Range   Troponin I (High Sensitivity) 483 (HH) <18 ng/L    Comment: CRITICAL VALUE NOTED. VALUE IS CONSISTENT WITH PREVIOUSLY REPORTED/CALLED VALUE SKL (NOTE) Elevated high sensitivity troponin I (hsTnI) values and significant  changes across serial measurements may suggest ACS but many other  chronic and acute conditions are known to elevate hsTnI results.  Refer to the "Links" section for chest pain algorithms and additional  guidance. Performed at [facility], [address] [place], [place] [address]   Glucose, capillary     Status: Abnormal   Collection Time: 02/13/2023  5:52 PM  Result Value Ref Range   Glucose-Capillary 216 (H) 70 - 99 mg/dL    Comment: Glucose reference range applies only to samples taken after fasting for at least 8 hours.  Glucose, capillary     Status: Abnormal   Collection Time: 02/19/2023  6:49 PM  Result Value Ref Range   Glucose-Capillary 177 (H) 70 - 99 mg/dL    Comment: Glucose reference range applies only to samples taken after fasting for at least 8 hours.  Basic metabolic panel     Status: Abnormal   Collection Time: 02/06/2023  8:13 PM  Result Value Ref Range   Sodium 134 (L) 135 - 145 mmol/L   Potassium 3.7 3.5 - 5.1 mmol/L   Chloride 98 98 - 111 mmol/L   CO2 26 22 - 32 mmol/L   Glucose, Bld 135 (H) 70 - 99 mg/dL    Comment: Glucose reference range applies only to samples taken after fasting for at least 8 hours.   BUN 35 (H) 6 - 20 mg/dL   Creatinine, Ser 4.43 (H) 0.61 - 1.24 mg/dL   Calcium 8.1 (L) 8.9 - 10.3 mg/dL   GFR, Estimated 24 (L) >60 mL/min    Comment: (NOTE) Calculated using the CKD-EPI Creatinine Equation (2021)     Anion gap 10 5 - 15    Comment: Performed at [facility], [address], [place], [place] [address]  Cooxemetry Panel (carboxy, met, total hgb, O2 sat)     Status: Abnormal   Collection Time: 02/10/2023  8:13 PM  Result Value Ref Range   Total hemoglobin 10.5 (L) 12.0 - 16.0 g/dL   O2 Saturation 86.7 %   Carboxyhemoglobin 1.2 0.5 - 1.5 %   Methemoglobin <0.7 0.0 - 1.5 %   Total oxygen content 97.0 %    Comment: Performed at [facility], [address] [place], [place] [address]  Hepatitis B surface antigen     Status: None   Collection Time: 02/10/2023  8:13 PM  Result Value Ref Range   Hepatitis B Surface Ag NON REACTIVE NON REACTIVE    Comment: Performed at [facility], [address], [place], [place] [address]  Hepatitis B surface antibody,quantitative     Status: None   Collection Time: 02/01/2023  8:13 PM  Result Value Ref Range   Hep B S AB Quant (Post) 141.0 Immunity>10 mIU/mL    Comment: (NOTE)  Status of Immunity                     Anti-HBs Level  ------------------                     -------------- Inconsistent with Immunity                  0.0 - 10.0 Consistent with Immunity                         >  10.0 Performed At: [facility] [address] [place], [place] [id number] Jolene Schimke MD DD:[id number]   Glucose, capillary     Status: Abnormal   Collection Time: 01/28/2023  8:19 PM  Result Value Ref Range   Glucose-Capillary 150 (H) 70 - 99 mg/dL    Comment: Glucose reference range applies only to samples taken after fasting for at least 8 hours.  Glucose, capillary     Status: Abnormal   Collection Time: 02/05/2023  9:28 PM  Result Value Ref Range   Glucose-Capillary 128 (H) 70 - 99 mg/dL    Comment: Glucose reference range applies only to samples taken after fasting for at least 8 hours.  Glucose, capillary     Status: Abnormal   Collection Time: 01/30/2023 10:40 PM  Result Value Ref Range   Glucose-Capillary 108 (H) 70 - 99  mg/dL    Comment: Glucose reference range applies only to samples taken after fasting for at least 8 hours.  Basic metabolic panel     Status: Abnormal   Collection Time: 02/14/2023 10:44 PM  Result Value Ref Range   Sodium 133 (L) 135 - 145 mmol/L   Potassium 3.6 3.5 - 5.1 mmol/L   Chloride 97 (L) 98 - 111 mmol/L   CO2 26 22 - 32 mmol/L   Glucose, Bld 128 (H) 70 - 99 mg/dL    Comment: Glucose reference range applies only to samples taken after fasting for at least 8 hours.   BUN 32 (H) 6 - 20 mg/dL   Creatinine, Ser 2.37 (H) 0.61 - 1.24 mg/dL   Calcium 8.0 (L) 8.9 - 10.3 mg/dL   GFR, Estimated 24 (L) >60 mL/min    Comment: (NOTE) Calculated using the CKD-EPI Creatinine Equation (2021)    Anion gap 10 5 - 15    Comment: Performed at [facility], [address] [place], [place] [address]  Glucose, capillary     Status: Abnormal   Collection Time: 02/01/2023 11:38 PM  Result Value Ref Range   Glucose-Capillary 155 (H) 70 - 99 mg/dL    Comment: Glucose reference range applies only to samples taken after fasting for at least 8 hours.  Glucose, capillary     Status: Abnormal   Collection Time: 02/19/23  1:41 AM  Result Value Ref Range   Glucose-Capillary 149 (H) 70 - 99 mg/dL    Comment: Glucose reference range applies only to samples taken after fasting for at least 8 hours.  Basic metabolic panel     Status: Abnormal   Collection Time: 02/19/23  2:17 AM  Result Value Ref Range   Sodium 131 (L) 135 - 145 mmol/L   Potassium 4.1 3.5 - 5.1 mmol/L   Chloride 96 (L) 98 - 111 mmol/L   CO2 23 22 - 32 mmol/L   Glucose, Bld 150 (H) 70 - 99 mg/dL    Comment: Glucose reference range applies only to samples taken after fasting for at least 8 hours.   BUN 35 (H) 6 - 20 mg/dL   Creatinine, Ser 5.17 (H) 0.61 - 1.24 mg/dL   Calcium 8.0 (L) 8.9 - 10.3 mg/dL   GFR, Estimated 19 (L) >60 mL/min    Comment: (NOTE) Calculated using the CKD-EPI Creatinine Equation (2021)    Anion gap 12 5  - 15    Comment: Performed at [facility], [address], [place], [place] [address]  Lipoprotein A (LPA)     Status: Abnormal   Collection Time: 02/19/23  3:58 AM  Result Value Ref Range   Lipoprotein (a) 49.7 (H) <75.0 nmol/L    Comment: (NOTE) Note:  Values greater than or equal to 75.0 nmol/L may       indicate an independent risk factor for CHD,       but must be evaluated with caution when applied       to non-Caucasian populations due to the       influence of genetic factors on Lp(a) across       ethnicities. Performed At: [facility] [address] [place], [place] [id number] Jolene Schimke MD WU:[id number]   CBC     Status: Abnormal   Collection Time: 02/19/23  3:58 AM  Result Value Ref Range   WBC 19.6 (H) 4.0 - 10.5 K/uL   RBC 3.01 (L) 4.22 - 5.81 MIL/uL   Hemoglobin 9.2 (L) 13.0 - 17.0 g/dL   HCT 95.6 (L) 21.3 - 08.6 %   MCV 91.7 80.0 - 100.0 fL   MCH 30.6 26.0 - 34.0 pg   MCHC 33.3 30.0 - 36.0 g/dL   RDW 57.8 46.9 - 62.9 %   Platelets 177 150 - 400 K/uL   nRBC 0.0 0.0 - 0.2 %    Comment: Performed at [facility], [address] [place], [place] [address]  Renal function panel     Status: Abnormal   Collection Time: 02/19/23  3:58 AM  Result Value Ref Range   Sodium 131 (L) 135 - 145 mmol/L   Potassium 4.3 3.5 - 5.1 mmol/L   Chloride 96 (L) 98 - 111 mmol/L   CO2 23 22 - 32 mmol/L   Glucose, Bld 161 (H) 70 - 99 mg/dL    Comment: Glucose reference range applies only to samples taken after fasting for at least 8 hours.   BUN 36 (H) 6 - 20 mg/dL   Creatinine, Ser 3.24 (H) 0.61 - 1.24 mg/dL   Calcium 8.1 (L) 8.9 - 10.3 mg/dL   Phosphorus 5.7 (H) 2.5 - 4.6 mg/dL   Albumin 2.7 (L) 3.5 - 5.0 g/dL   GFR, Estimated 19 (L) >60 mL/min    Comment: (NOTE) Calculated using the CKD-EPI Creatinine Equation (2021)    Anion gap 12 5 - 15    Comment: Performed at [facility], [address] [place], [place] [address]   Triglycerides     Status: Abnormal   Collection Time: 02/19/23  3:58 AM  Result Value Ref Range   Triglycerides 189 (H) <150 mg/dL    Comment: Performed at [facility], [address] [place], [place] [address]  Hepatic function panel     Status: Abnormal   Collection Time: 02/19/23  3:58 AM  Result Value Ref Range   Total Protein 4.8 (L) 6.5 - 8.1 g/dL   Albumin 2.6 (L) 3.5 - 5.0 g/dL   AST 35 15 - 41 U/L   ALT 64 (H) 0 - 44 U/L   Alkaline Phosphatase 94 38 - 126 U/L   Total Bilirubin 0.6 <1.2 mg/dL   Bilirubin, Direct <6.4 0.0 - 0.2 mg/dL   Indirect Bilirubin NOT CALCULATED 0.3 - 0.9 mg/dL    Comment: Performed at [facility], [address] [place], [place] [address]  Magnesium     Status: Abnormal   Collection Time: 02/19/23  3:58 AM  Result Value Ref Range   Magnesium 1.6 (L) 1.7 - 2.4 mg/dL    Comment: Performed at [facility], [address] [place], [place] [address]  Glucose, capillary     Status: Abnormal   Collection Time: 02/19/23  4:01 AM  Result Value Ref Range   Glucose-Capillary 160 (H) 70 - 99 mg/dL    Comment: Glucose reference range applies only to samples taken after fasting for at least 8 hours.  Blood gas, arterial     Status: Abnormal   Collection Time: 02/19/23  5:00 AM  Result Value Ref Range   FIO2 50 %   Delivery systems VENTILATOR    MECHVT 500 mL   PEEP 10 cm H20   pH, Arterial 7.36 7.35 - 7.45   pCO2 arterial 39 32 - 48 mmHg   pO2, Arterial 159 (H) 83 - 108 mmHg   Bicarbonate 22.0 20.0 - 28.0 mmol/L   Acid-base deficit 3.1 (H) 0.0 - 2.0 mmol/L   O2 Saturation 99.5 %   Patient temperature 37.0    Collection site A-LINE    Mechanical Rate 20     Comment: Performed at [facility], [address] [place], [place] [address]  Glucose, capillary     Status: Abnormal   Collection Time: 02/19/23  7:54 AM  Result Value Ref Range   Glucose-Capillary 165 (H) 70 - 99 mg/dL    Comment: Glucose reference  range applies only to samples taken after fasting for at least 8 hours.  Glucose, capillary     Status: Abnormal   Collection Time: 02/19/23 11:09 AM  Result Value Ref Range   Glucose-Capillary 167 (H) 70 - 99 mg/dL    Comment: Glucose reference range applies only to samples taken after fasting for at least 8 hours.  Glucose, capillary     Status: Abnormal   Collection Time: 02/19/23  3:58 PM  Result Value Ref Range   Glucose-Capillary 158 (H) 70 - 99 mg/dL    Comment: Glucose reference range applies only to samples taken after fasting for at least 8 hours.  Glucose, capillary     Status: Abnormal   Collection Time: 02/19/23  7:35 PM  Result Value Ref Range   Glucose-Capillary 142 (H) 70 - 99 mg/dL    Comment: Glucose reference range applies only to samples taken after fasting for at least 8 hours.  Glucose, capillary     Status: Abnormal   Collection Time: 02/19/23 11:07 PM  Result Value Ref Range   Glucose-Capillary 196 (H) 70 - 99 mg/dL    Comment: Glucose reference range applies only to samples taken after fasting for at least 8 hours.  Glucose, capillary     Status: Abnormal   Collection Time: 02/20/23  3:26 AM  Result Value Ref Range   Glucose-Capillary 195 (H) 70 - 99 mg/dL    Comment: Glucose reference range applies only to samples taken after fasting for at least 8 hours.  CBC     Status: Abnormal   Collection Time: 02/20/23  3:58 AM  Result Value Ref Range   WBC 13.1 (H) 4.0 - 10.5 K/uL   RBC 2.61 (L) 4.22 - 5.81 MIL/uL   Hemoglobin 8.0 (L) 13.0 - 17.0 g/dL   HCT 82.9 (L) 56.2 - 13.0 %   MCV 93.5 80.0 - 100.0 fL   MCH 30.7 26.0 - 34.0 pg   MCHC 32.8 30.0 - 36.0 g/dL   RDW 86.5 78.4 - 69.6 %   Platelets 152 150 - 400 K/uL   nRBC 0.0 0.0 - 0.2 %    Comment: Performed at [facility], [address] [place], [place] [address]  Renal function panel     Status: Abnormal   Collection Time: 02/20/23  3:58 AM  Result Value Ref Range   Sodium 132 (L) 135 - 145  mmol/L   Potassium 4.0 3.5 - 5.1 mmol/L   Chloride 97 (L) 98 - 111 mmol/L   CO2 24 22 - 32 mmol/L   Glucose, Bld 199 (H) 70 - 99 mg/dL    Comment: Glucose reference range applies only to samples taken after fasting for at least 8 hours.   BUN 47 (H) 6 - 20 mg/dL   Creatinine, Ser 6.04 (H) 0.61 - 1.24 mg/dL   Calcium 7.9 (L) 8.9 - 10.3 mg/dL   Phosphorus 6.4 (H) 2.5 - 4.6 mg/dL   Albumin 2.3 (L) 3.5 - 5.0 g/dL   GFR, Estimated 13 (L) >60 mL/min    Comment: (NOTE) Calculated using the CKD-EPI Creatinine Equation (2021)    Anion gap 11 5 - 15    Comment: Performed at [facility], [address], [place], [place] [address]  Magnesium     Status: None   Collection Time: 02/20/23  3:58 AM  Result Value Ref Range   Magnesium 2.0 1.7 - 2.4 mg/dL    Comment: Performed at [facility], [address] [place], [place] [address]  Glucose, capillary     Status: Abnormal   Collection Time: 02/20/23  9:12 AM  Result Value Ref Range   Glucose-Capillary 210 (H) 70 - 99 mg/dL    Comment: Glucose reference range applies only to samples taken after fasting for at least 8 hours.  Glucose, capillary     Status: Abnormal   Collection Time: 02/20/23 11:32 AM  Result Value Ref Range   Glucose-Capillary 172 (H) 70 - 99 mg/dL    Comment: Glucose reference range applies only to samples taken after fasting for at least 8 hours.    Recent Results (from the past 240 hour(s))  Urine Culture     Status: None   Collection Time: 02/22/2023 10:51 AM   Specimen: Urine, Random  Result Value Ref Range Status   Specimen Description   Final    URINE, RANDOM Performed at [facility], [address], [place], [place] [address]    Special Requests   Final    NONE Reflexed from [phone] Performed at [facility], [address], [place], [place] [address]    Culture   Final    NO GROWTH Performed at [facility], [address], [place], [place] [address]    Report  Status 02/19/2023 FINAL  Final  MRSA Next Gen by PCR, Nasal     Status: None   Collection Time: 02/22/2023 10:59 AM   Specimen: Nasal Mucosa; Nasal Swab  Result Value Ref Range Status   MRSA by PCR Next Gen NOT DETECTED NOT DETECTED Final    Comment: (NOTE) The GeneXpert MRSA Assay (FDA approved for NASAL specimens only), is one component of a comprehensive MRSA colonization surveillance program. It is not intended to diagnose MRSA infection nor to guide or monitor treatment for MRSA infections. Test performance is not FDA approved in patients less than 51 years old. Performed at [facility], [address] [place], [place] [address]   Culture, blood (Routine X 2) w Reflex to ID Panel     Status: None (Preliminary result)   Collection Time: 02/06/2023  1:12 PM   Specimen: BLOOD RIGHT HAND  Result Value Ref  Range Status   Specimen Description BLOOD RIGHT HAND  Final   Special Requests   Final    Blood Culture results may not be optimal due to an excessive volume of blood received in culture bottles   Culture   Final    NO GROWTH 2 DAYS Performed at [facility], [address] [place], [place] [address]    Report Status PENDING  Incomplete  Culture, blood (Routine X 2) w Reflex to ID Panel     Status: None (Preliminary result)   Collection Time: 02/12/2023  1:22 PM   Specimen: A-Line; Blood  Result Value Ref Range Status   Specimen Description A-LINE  Final   Special Requests BCAV  Final   Culture   Final    NO GROWTH 2 DAYS Performed at [facility], [address], [place], [place] [address]    Report Status PENDING  Incomplete    Lipid Panel Recent Labs    02/19/23 0358  TRIG 189*    Studies/Results: EEG adult  Result Date: 02/20/2023 Charlsie Quest, MD     02/20/2023 11:59 AM Patient Name: James Moreno MRN: [id number] Epilepsy Attending: Charlsie Quest Referring Physician/Provider: Caryl Pina, MD Date: 02/20/2023 Duration:  34.49 mins Patient history: 57 year old male status post cardiac arrest.  EEG evaluate for seizure.  Level of alertness: comatose  AEDs during EEG study: LEV, versed, propofol  Technical aspects: This EEG study was done with scalp electrodes positioned according to the 10-20 International system of electrode placement. Electrical activity was reviewed with band pass filter of 1-70Hz , sensitivity of 7 uV/mm, display speed of 36mm/sec with a 60Hz  notched filter applied as appropriate. EEG data were recorded continuously and digitally stored.  Video monitoring was available and reviewed as appropriate.  Description: EEG showed burst suppression with burst of 3-7hz  theta-delta slowing lasting 1-3 seconds alternating with 5-7 seconds of generalized background suppression. Hyperventilation and photic stimulation were not performed.    ABNORMALITY - Burst suppression, generalized  IMPRESSION: This study was suggestive of profound diffuse encephalopathy.  No seizures were noted.  Charlsie Quest   DG Abd Portable 1V  Result Date: 02/19/2023 CLINICAL DATA:  Feeding tube placement EXAM: PORTABLE ABDOMEN - 1 VIEW COMPARISON:  Abdominal x-ray 02/17/2023 FINDINGS: Enteric tube tip is in the proximal body of the stomach. There is a small amount of contrast in the stomach. Sidehole is now just below the level of the gastroesophageal junction. IMPRESSION: Enteric tube tip is in the proximal body of the stomach. Electronically Signed   By: Darliss Cheney M.D.   On: 02/19/2023 20:14   EEG adult  Result Date: 02/19/2023 Charlsie Quest, MD     02/19/2023  3:57 PM Patient Name: James Moreno MRN: [id number] Epilepsy Attending: Charlsie Quest Referring Physician/Provider: Caryl Pina, MD Date: 02/19/2023 Duration: 28.20 mins  Patient history: 57 year old male status post cardiac arrest.  EEG evaluate for seizure.  Level of alertness: comatose  AEDs during EEG study: LEV, versed, propofol  Technical aspects: This EEG  study was done with scalp electrodes positioned according to the 10-20 International system of electrode placement. Electrical activity was reviewed with band pass filter of 1-70Hz , sensitivity of 7 uV/mm, display speed of 73mm/sec with a 60Hz  notched filter applied as appropriate. EEG data were recorded continuously and digitally stored.  Video monitoring was available and reviewed as appropriate.  Description: EEG showed burst suppression with burst of 3-7hz  theta-delta slowing lasting 5-10 seconds alternating with  10-15 seconds of generalized background suppression. Hyperventilation and photic stimulation were not performed.    ABNORMALITY - Burst suppression, generalized  IMPRESSION: This study was suggestive of profound diffuse encephalopathy.  No seizures were noted.  Charlsie Quest   DG Chest Port 1 View  Result Date: 02/19/2023 CLINICAL DATA:  Acute respiratory failure, hypoxia EXAM: PORTABLE CHEST 1 VIEW COMPARISON:  01/26/2023 FINDINGS: Two frontal views of the chest demonstrate stable endotracheal tube, enteric catheter, and right internal jugular dialysis catheter. Left internal jugular catheter tip again projects over the brachiocephalic confluence. Cardiac silhouette is stable. There is persistent pulmonary vascular congestion, likely not significantly changed given differences in patient positioning and technique. No effusion or pneumothorax. No acute bony abnormalities. IMPRESSION: 1. Support devices as above. 2. Pulmonary vascular congestion, with no significant change in volume status. Electronically Signed   By: Sharlet Salina M.D.   On: 02/19/2023 09:04   MR BRAIN WO CONTRAST  Result Date: 02/19/2023 CLINICAL DATA:  Follow-up examination for stroke. EXAM: MRI HEAD WITHOUT CONTRAST TECHNIQUE: Multiplanar, multiecho pulse sequences of the brain and surrounding structures were obtained without intravenous contrast. COMPARISON:  Prior CT from 02/19/2023. FINDINGS: Brain: Cerebral volume  within normal limits. Patchy T2/FLAIR hyperintensity involving the periventricular and deep white matter of both cerebral hemispheres as well as the pons, consistent with chronic small vessel ischemic disease. Remote lacunar infarcts present at the left thalamus and pons. Extensive diffusion signal abnormality seen involving the cortical gray matter of both cerebral hemispheres, slightly worse on the right. Symmetric involvement of the bilateral basal ganglia and thalami. Diffusion signal abnormality seen throughout the cerebellum. Associated T2/FLAIR signal abnormality throughout the areas affected. Findings consistent with widespread anoxic brain injury. No definite superimposed vascular infarct. Multiple scattered punctate foci of susceptibility artifact noted, consistent with small chronic micro hemorrhages, likely hypertensive in nature. Few small foci of susceptibility artifact noted within the left frontoparietal region. No visible acute hemorrhage seen at these locations on prior head CT. No mass lesion or midline shift. No hydrocephalus or extra-axial fluid collection. Pituitary gland and suprasellar region within normal limits. Vascular: Major intracranial vascular flow voids are maintained. Skull and upper cervical spine: Craniocervical junction within normal limits. Decreased T1 signal intensity seen within the visualized bone marrow, nonspecific, but most commonly related to anemia, smoking, or obesity. No scalp soft tissue abnormality. Sinuses/Orbits: Prior ocular lens replacement on the right. Scattered mucosal thickening present throughout the paranasal sinuses. Trace bilateral mastoid effusions. Patient is intubated. Other: None. IMPRESSION: 1. Findings consistent with widespread anoxic brain injury as detailed above. 2. Underlying chronic microvascular ischemic disease with remote lacunar infarcts at the left thalamus and pons. 3. Few small foci of susceptibility artifact involving the left  frontoparietal region. While no visible hemorrhage is seen or these locations on prior head CT, that exam was somewhat limited due to the presence of IV contrast material related to prior cardiac catheterization. Correlation with repeat noncontrast head CT suggested to evaluate for possible trace subarachnoid hemorrhage as warranted. Electronically Signed   By: Rise Mu M.D.   On: 02/19/2023 06:54   CT HEAD WO CONTRAST ( )  Result Date: 02/10/2023 CLINICAL DATA:  Mental status change, unknown cause. EXAM: CT HEAD WITHOUT CONTRAST TECHNIQUE: Contiguous axial images were obtained from the base of the skull through the vertex without intravenous contrast. RADIATION DOSE REDUCTION: This exam was performed according to the departmental dose-optimization program which includes automated exposure control, adjustment of the mA and/or kV according to patient  size and/or use of iterative reconstruction technique. COMPARISON:  Head CT February 17, 2019. FINDINGS: Brain: Although no intravenous contrast was administered to obtain this study, the images are enhance by contrast administered during cardiac catheterization performed earlier today. This evaluation for possible subarachnoid hemorrhage. Limits hypodense focus is seen on the left side of the pons, concerning for infarct versus artifact. A hypodense focus is also seen in the left thalamus. No large acute territorial infarct identified. No hydrocephalus, extra-axial collection or mass lesion/mass effect. Vascular: Calcified plaques in the bilateral carotid siphons. Skull: Normal. Negative for fracture or focal lesion. Sinuses/Orbits: Fluid within the paranasal sinuses and within the nasopharynx and oropharynx related to presence of endotracheal tube. The orbits are maintained. Other: Soft tissue swelling on the right side of the face may be posttraumatic. IMPRESSION: 1. Limited study due to presence of contrast from cardiac catheterization performed  earlier today. 2. Hypodense focus in the left side of the pons and left thalamus, concerning for infarct versus artifact. Correlation with MRI of the brain recommended. 3. Soft tissue swelling on the right side of the face may be posttraumatic. Electronically Signed   By: Baldemar Lenis M.D.   On: 02/10/2023 16:28   EEG adult  Result Date: 01/30/2023 Charlsie Quest, MD     01/27/2023  1:40 PM Patient Name: James Moreno MRN: [id number] Epilepsy Attending: Charlsie Quest Referring Physician/Provider: Judithe Modest, NP Date: 02/01/2023 Duration: 25.50 mins Patient history: 57 year old male status post cardiac arrest.  EEG evaluate for seizure. Level of alertness: comatose AEDs during EEG study: LEV, versed Technical aspects: This EEG study was done with scalp electrodes positioned according to the 10-20 International system of electrode placement. Electrical activity was reviewed with band pass filter of 1-70Hz , sensitivity of 7 uV/mm, display speed of 69mm/sec with a 60Hz  notched filter applied as appropriate. EEG data were recorded continuously and digitally stored.  Video monitoring was available and reviewed as appropriate. Description: Patient was noted to have episodes of brief sudden eye opening with whole body jerking every few seconds.  Concomitant EEG showed generalized polyspikes consistent with myoclonic seizures.  In between seizures EEG showed generalized background suppression. Hyperventilation and photic stimulation were not performed.   ABNORMALITY -Myoclonic seizure, generalized -Background suppression, generalized IMPRESSION: Patient was noted to have myoclonic seizures every few seconds.  Additionally there was evidence of severe to profound diffuse encephalopathy.  In the setting of cardiac arrest, this EEG pattern is concerning for anoxic/hypoxic brain injury. Harlon Ditty, NP was notified. Priyanka Annabelle Harman    Medications: Scheduled:  [START ON 02/10/2023]  aspirin  81 mg Per Tube Daily   Chlorhexidine Gluconate Cloth  6 each Topical Q0600   docusate  100 mg Per Tube BID   feeding supplement (PROSource TF20)  60 mL Per Tube BID   free water  30 mL Per Tube Q4H   heparin injection (subcutaneous)  5,000 Units Subcutaneous Q8H   insulin aspart  0-6 Units Subcutaneous Q4H   mouth rinse  15 mL Mouth Rinse Q2H   pantoprazole (PROTONIX) IV  40 mg Intravenous Daily   polyethylene glycol  17 g Per Tube Daily   sodium chloride flush  3 mL Intravenous Q12H   sodium chloride flush  3 mL Intravenous Q12H   ticagrelor  90 mg Per Tube BID   Continuous:  cefTRIAXone (ROCEPHIN)  IV Stopped (02/19/23 1548)   feeding supplement (VITAL 1.5 CAL) 35 mL/hr at 02/20/23 0725   fentaNYL  infusion INTRAVENOUS 100 mcg/hr (02/20/23 0725)   levETIRAcetam 1,000 mg (02/20/23 1135)   levETIRAcetam     midazolam Stopped (02/20/23 1050)   norepinephrine (LEVOPHED) Adult infusion 6 mcg/min (02/20/23 1129)   propofol (DIPRIVAN) infusion Stopped (02/20/23 1051)     Assessment: 57 year old male who presented in cardiac arrest on Monday. Total ACLS time was estimated to be 28 minutes. Myoclonus and rhythmic facial fasciculations were noted by ICU team. Anoxic brain injury has been confirmed by MRI.   - Exam under sedation reveals an unresponsive patient with absent pupillary light reflex, absent oculocephalic reflex and absent corneals. No movement of the extremities to any stimuli. The intermittent low-amplitude myoclonic jerking of the face and lower legs that was seen on Monday was resolved on yesterday's exam and continues to be resolved on this evening's exam.   - CT head (11/25): Limited study due to presence of contrast from cardiac catheterization performed earlier today. Hypodense focus in the left side of the pons and left thalamus, concerning for infarct versus artifact. Correlation with MRI of the brain recommended.   - MRI brain (11/26 at 0120):  Findings consistent  with widespread anoxic brain injury. Underlying chronic microvascular ischemic disease with remote lacunar infarcts at the left thalamus and pons. Few small foci of susceptibility artifact involving the left frontoparietal region. While no visible hemorrhage is seen or these locations on prior head CT, that exam was somewhat limited due to the presence of IV contrast material related to prior cardiac catheterization. Correlation with repeat noncontrast head CT suggested to evaluate for possible trace subarachnoid hemorrhage as warranted. - EEGs: - EEG performed this morning 50 minutes after stopping propofol and Versed: Burst suppression, generalized. This study was suggestive of profound diffuse encephalopathy. No seizures were noted - EEG Tuesday (11/26): Burst suppression, generalized. This study is suggestive of profound diffuse encephalopathy.  No seizures are noted. - EEG (11/25): Patient was noted to have myoclonic seizures every few seconds. Additionally there was evidence of severe to profound diffuse encephalopathy. In the setting of cardiac arrest, this EEG pattern is concerning for anoxic/hypoxic brain injury. - Labs: - Elevated transaminases: AST 35, ALT 64.  - Na 132, Mg 2.0 - Ammonia level is pending - Significant comorbidities:              - ST elevation MI, s/p cath - ESRD on HD - Overall impression:  - Anoxic brain injury secondary to cardiac arrest - Myoclonus, most likely secondary to anoxic brain injury, now resolved     Recommendations: - Due to the patient having ESRD, continue renal Keppra dosing regimen at 1000 mg every day with supplemental 500 mg IV doses after dialysis sessions - Repeat EEG this afternoon at 4 PM after 5 hours off propofol/Versed to ensure no recurrence of electrographic seizure activity.  - Supportive care and electrolyte management per CCM - Assessment of prognosis will need to be made >/= 72 hours after cardiac arrest, after being off all  sedation for 24 hours. If able to keep off sedation, will be able to examine for prognostication tomorrow (Thursday)   Addendum: Repeat EEG at 5:00 PM, 6 hours after discontinuation of Versed and propofol: Background suppression, generalized. This study was suggestive of profound diffuse encephalopathy.  No seizures were noted.  35 minutes spent in the neurological assessment and management of this critically ill patient   LOS: 2 days   @Electronically  signed: Dr. Caryl Pina 02/20/2023  12:07 PM

## 2023-02-20 NOTE — Progress Notes (Signed)
PHARMACY CONSULT NOTE - FOLLOW UP  Pharmacy Consult for Electrolyte Monitoring and Replacement   Recent Labs: Potassium (mmol/L)  Date Value  02/20/2023 4.0  06/21/2013 4.1   Magnesium (mg/dL)  Date Value  16/12/9602 2.0   Calcium (mg/dL)  Date Value  54/11/8117 7.9 (L)   Calcium, Total (mg/dL)  Date Value  14/78/2956 7.3 (L)   Albumin (g/dL)  Date Value  21/30/8657 2.3 (L)  02/12/2022 3.9  06/21/2013 3.1 (L)   Phosphorus (mg/dL)  Date Value  84/69/6295 6.4 (H)   Sodium (mmol/L)  Date Value  02/20/2023 132 (L)  02/12/2022 130 (L)  06/21/2013 132 (L)   Corrected Ca: 8.7 mg/dL  Assessment: 57 y.o. male w/ PMH of  HTN, DM, ESRD s/p renal transplant currently on hemodialysis who presents to the emergency department in cardiac arrest.   Goal of Therapy:  Potassium 4.0 - 5.1 mmol/L Magnesium 2.0 - 2.4 mg/dL All Other Electrolytes WNL  Plan:  ---no electrolyte replacement warranted for today ---recheck BMP and magnesium in am  Lowella Bandy ,PharmD Clinical Pharmacist 02/20/2023 8:05 AM

## 2023-02-20 NOTE — Procedures (Signed)
Patient Name: James Moreno  MRN: [id number]  Epilepsy Attending: Charlsie Quest  Referring Physician/Provider: Caryl Pina, MD  Date: 02/20/2023 Duration: 34.49 mins  Patient history: 57 year old male status post cardiac arrest.  EEG evaluate for seizure.   Level of alertness: comatose   AEDs during EEG study: LEV, versed, propofol   Technical aspects: This EEG study was done with scalp electrodes positioned according to the 10-20 International system of electrode placement. Electrical activity was reviewed with band pass filter of 1-70Hz , sensitivity of 7 uV/mm, display speed of 30mm/sec with a 60Hz  notched filter applied as appropriate. EEG data were recorded continuously and digitally stored.  Video monitoring was available and reviewed as appropriate.   Description: EEG showed burst suppression with burst of 3-7hz  theta-delta slowing lasting 1-3 seconds alternating with 5-7 seconds of generalized background suppression. Hyperventilation and photic stimulation were not performed.      ABNORMALITY - Burst suppression, generalized   IMPRESSION: This study was suggestive of profound diffuse encephalopathy.  No seizures were noted.   Diani Jillson Annabelle Harman

## 2023-02-20 NOTE — Consult Note (Signed)
Consultation Note Date: 02/20/2023   Patient Name: James Moreno  DOB: 1966/02/22  MRN: [id number]  Age / Sex: 57 y.o., male  PCP: [name], MD Referring Physician: Janann Colonel, MD  Reason for Consultation: Establishing goals of care  HPI/Patient Profile: 57 y.o. male  with past medical history of  admitted hypertension, diabetes, end stage renal disease status post renal transplant currently on hemodialysis on 02/02/2023 with cardiac arrest d/t STEMI. Positive for cocaine. Cath revealed revealed 99% stenosis ostial LAD, and chronically appearing 99% stenosis OM 2 with bridging collaterals. The patient underwent primary PCI receiving 3.0 x 8 mm Onyx frontier drug-eluting stent ostial LAD. Left ventriculography revealed fairly reduced left ventricular function with estimated LV ejection fraction of 25%. Neuro exam and MRI concerning for anoxic injury. PMT consulted to discuss GOC.   Clinical Assessment and Goals of Care: I have reviewed medical records including EPIC notes, labs and imaging, received report from RN, assessed the patient and then met with multiple family members to include daughter Berline Lopes  to discuss diagnosis prognosis, GOC, EOL wishes, disposition and options.  I introduced Palliative Medicine as specialized medical care for people living with serious illness. It focuses on providing relief from the symptoms and stress of a serious illness. The goal is to improve quality of life for both the patient and the family.  We discussed a brief life review of the patient. They tell me about patient's life and how he "lived life large".   They tell me about decline in patient's quality of life since having to go back on dialysis - was not tolerating it well. He was trying to "keep going" until his daughter got married but was "getting tired".   We discussed patient's current illness and what it means in the larger context  of patient's on-going co-morbidities.  Natural disease trajectory and expectations at EOL were discussed. We discussed MRI, EEG, and neurological exam with concerns for diffuse anoxic injury. We discussed unlikely for patient to have any meaningful recovery.   I attempted to elicit values and goals of care important to the patient.  Family all agree quality of life important to patient and he would not find current situation acceptable quality of life.   The difference between aggressive medical intervention and comfort care was considered in light of the patient's goals of care.   Family (specifically daughter Berline Lopes) agree to change code status to DNR and not escalate care. They would like to maintain current situation in order to give family opportunity to spend time with him. They also do not want to extubate Thanksgiving day and so will consider extubation the next day.   Discussed with family the importance of continued conversation with family and the medical providers regarding overall plan of care and treatment options, ensuring decisions are within the context of the patient's values and GOCs.    Questions and concerns were addressed. The family was encouraged to call with questions or concerns.    Primary Decision Maker NEXT OF KIN - daughter - Trad Timpe    SUMMARY OF RECOMMENDATIONS   -Code status change to DNR, planning for extubation - unclear when at this time as family would like to spend a little time with him and also do not want to extubate on thanksgiving -Maintain current level of care without escalation  Code Status/Advance Care Planning: DNR     Primary Diagnoses: Present on Admission:  Acute ST elevation myocardial infarction (STEMI) involving left anterior descending (  LAD) coronary artery (HCC)   I have reviewed the medical record, interviewed the patient and family, and examined the patient. The following aspects are pertinent.  Past Medical History:   Diagnosis Date   Acute kidney failure, unspecified (HCC)    Anxiety    Diabetes mellitus, type 2 (HCC)    GERD (gastroesophageal reflux disease)    History of arterial disease of lower extremity    History of hepatitis B    Hypertension    Hypothyroidism    Mitral valve disorders(424.0)    Pityriasis 12/27/2014   Primary pulmonary HTN (HCC)    Pt denies ever having.   Tricuspid valve disorders, specified as nonrheumatic    Social History   Socioeconomic History   Marital status: Divorced    Spouse name: Not on file   Number of children: 1   Years of education: Not on file   Highest education level: Associate degree: occupational, Scientist, product/process development, or vocational program  Occupational History   Occupation: Surveyor, minerals    Comment: on disability  Tobacco Use   Smoking status: Every Day    Current packs/day: 1.00    Average packs/day: 1 pack/day for 41.0 years (41.0 ttl pk-yrs)    Types: Cigarettes   Smokeless tobacco: Never   Tobacco comments:    since age 75.    Smokes 1/2 PPD; decreased on 12/22/2022.   Vaping Use   Vaping status: Former  Substance and Sexual Activity   Alcohol use: Not Currently    Alcohol/week: 0.0 - 1.0 standard drinks of alcohol    Comment: Excessive alcohol consumption in the past. Quit around 2016. .   Drug use: No   Sexual activity: Not on file  Other Topics Concern   Not on file  Social History Narrative   Not on file   Social Determinants of Health   Financial Resource Strain: Medium Risk (03/01/2022)   Overall Financial Resource Strain (CARDIA)    Difficulty of Paying Living Expenses: Somewhat hard  Food Insecurity: Patient Unable To Answer (02/11/2023)   Hunger Vital Sign    Worried About Running Out of Food in the Last Year: Patient unable to answer    Ran Out of Food in the Last Year: Patient unable to answer  Transportation Needs: Patient Unable To Answer (02/03/2023)   PRAPARE - Transportation    Lack of Transportation (Medical):  Patient unable to answer    Lack of Transportation (Non-Medical): Patient unable to answer  Physical Activity: Inactive (03/01/2022)   Exercise Vital Sign    Days of Exercise per Week: 0 days    Minutes of Exercise per Session: 0 min  Stress: Stress Concern Present (03/01/2022)   [employer] of Occupational Health - Occupational Stress Questionnaire    Feeling of Stress : To some extent  Social Connections: Moderately Integrated (03/01/2022)   Social Connection and Isolation Panel [NHANES]    Frequency of Communication with Friends and Family: More than three times a week    Frequency of Social Gatherings with Friends and Family: Once a week    Attends Religious Services: More than 4 times per year    Active Member of [employer] or Organizations: No    Attends Banker Meetings: Never    Marital Status: Living with partner  Recent Concern: Social Connections - Moderately Isolated (02/28/2022)   Social Connection and Isolation Panel [NHANES]    Frequency of Communication with Friends and Family: More than three times a week    Frequency  of Social Gatherings with Friends and Family: Once a week    Attends Religious Services: More than 4 times per year    Active Member of [employer] or Organizations: No    Attends Engineer, structural: Never    Marital Status: Divorced   Family History  Problem Relation Age of Onset   Hypertension Mother    Hyperlipidemia Mother    Melanoma Father    Scheduled Meds:  [START ON 02/11/2023] aspirin  81 mg Per Tube Daily   Chlorhexidine Gluconate Cloth  6 each Topical Q0600   docusate  100 mg Per Tube BID   feeding supplement (PROSource TF20)  60 mL Per Tube BID   free water  30 mL Per Tube Q4H   heparin injection (subcutaneous)  5,000 Units Subcutaneous Q8H   insulin aspart  0-6 Units Subcutaneous Q4H   mouth rinse  15 mL Mouth Rinse Q2H   pantoprazole (PROTONIX) IV  40 mg Intravenous Daily   polyethylene glycol  17 g Per Tube Daily    sodium chloride flush  3 mL Intravenous Q12H   sodium chloride flush  3 mL Intravenous Q12H   ticagrelor  90 mg Per Tube BID   Continuous Infusions:  cefTRIAXone (ROCEPHIN)  IV Stopped (02/19/23 1548)   feeding supplement (VITAL 1.5 CAL) 35 mL/hr at 02/20/23 0725   fentaNYL infusion INTRAVENOUS 100 mcg/hr (02/20/23 0725)   levETIRAcetam 1,000 mg (02/20/23 1135)   levETIRAcetam     midazolam Stopped (02/20/23 1050)   norepinephrine (LEVOPHED) Adult infusion 6 mcg/min (02/20/23 1129)   propofol (DIPRIVAN) infusion Stopped (02/20/23 1051)   PRN Meds:.acetaminophen, alteplase, fentaNYL, heparin, heparin, levETIRAcetam, midazolam, mouth rinse, sodium chloride flush, sodium chloride flush Allergies  Allergen Reactions   No Known Allergies    Review of Systems  Unable to perform ROS: Patient unresponsive    Physical Exam Constitutional:      Appearance: He is ill-appearing.     Comments: unresponsive  Cardiovascular:     Rate and Rhythm: Normal rate.  Pulmonary:     Effort: Pulmonary effort is normal.     Comments: Remains intubated Skin:    General: Skin is warm and dry.  Neurological:     Comments: Pupils nonreactive     Vital Signs: BP (!) 162/50   Pulse 78   Temp (!) 101.3 F (38.5 C) (Axillary)   Resp 20   Ht 5' 10.98" (1.803 m)   Wt 102.1 kg   SpO2 93%   BMI 31.41 kg/m  Pain Scale: 0-10   Pain Score: 0-No pain   SpO2: SpO2: 93 % O2 Device:SpO2: 93 % O2 Flow Rate: .O2 Flow Rate (L/min): 96 L/min  IO: Intake/output summary:  Intake/Output Summary (Last 24 hours) at 02/20/2023 1302 Last data filed at 02/20/2023 0725 Gross per 24 hour  Intake 1189.69 ml  Output 417 ml  Net 772.69 ml    LBM: Last BM Date :  (PTA) Baseline Weight: Weight: 99.3 kg Most recent weight: Weight: 102.1 kg       *Please note that this is a verbal dictation therefore any spelling or grammatical errors are due to the "[employer]agon [employer] One" system interpretation.   Time  Total: 95 minutes Time spent includes: Detailed review of medical records (labs, imaging, vital signs), medically appropriate exam, discussion with treatment team, counseling and educating patient, family and/or staff, documenting clinical information, medication management and coordination of care.    Gerlean Ren, DNP, AGNP-C Palliative Medicine Team [phone]  Pager: [phone]

## 2023-02-20 NOTE — Plan of Care (Signed)
  Problem: Education: Goal: Knowledge of General Education information will improve Description: Including pain rating scale, medication(s)/side effects and non-pharmacologic comfort measures Outcome: Not Progressing   Problem: Health Behavior/Discharge Planning: Goal: Ability to manage health-related needs will improve Outcome: Not Progressing   Problem: Clinical Measurements: Goal: Ability to maintain clinical measurements within normal limits will improve Outcome: Not Progressing Goal: Will remain free from infection Outcome: Not Progressing Goal: Diagnostic test results will improve Outcome: Not Progressing Goal: Respiratory complications will improve Outcome: Not Progressing Goal: Cardiovascular complication will be avoided Outcome: Not Progressing   Problem: Activity: Goal: Risk for activity intolerance will decrease Outcome: Not Progressing   Problem: Nutrition: Goal: Adequate nutrition will be maintained Outcome: Not Progressing   Problem: Coping: Goal: Level of anxiety will decrease Outcome: Not Progressing   Problem: Elimination: Goal: Will not experience complications related to bowel motility Outcome: Not Progressing Goal: Will not experience complications related to urinary retention Outcome: Not Progressing   Problem: Pain Management: Goal: General experience of comfort will improve Outcome: Not Progressing   Problem: Safety: Goal: Ability to remain free from injury will improve Outcome: Not Progressing   Problem: Skin Integrity: Goal: Risk for impaired skin integrity will decrease Outcome: Not Progressing   Problem: Activity: Goal: Ability to tolerate increased activity will improve Outcome: Not Progressing   Problem: Respiratory: Goal: Ability to maintain a clear airway and adequate ventilation will improve Outcome: Not Progressing   Problem: Role Relationship: Goal: Method of communication will improve Outcome: Not Progressing   Problem:  Education: Goal: Understanding of CV disease, CV risk reduction, and recovery process will improve Outcome: Not Progressing Goal: Individualized Educational Video(s) Outcome: Not Progressing   Problem: Activity: Goal: Ability to return to baseline activity level will improve Outcome: Not Progressing   Problem: Cardiovascular: Goal: Ability to achieve and maintain adequate cardiovascular perfusion will improve Outcome: Not Progressing Goal: Vascular access site(s) Level 0-1 will be maintained Outcome: Not Progressing   Problem: Health Behavior/Discharge Planning: Goal: Ability to safely manage health-related needs after discharge will improve Outcome: Not Progressing   Problem: Education: Goal: Ability to describe self-care measures that may prevent or decrease complications (Diabetes Survival Skills Education) will improve Outcome: Not Progressing Goal: Individualized Educational Video(s) Outcome: Not Progressing   Problem: Coping: Goal: Ability to adjust to condition or change in health will improve Outcome: Not Progressing   Problem: Fluid Volume: Goal: Ability to maintain a balanced intake and output will improve Outcome: Not Progressing   Problem: Health Behavior/Discharge Planning: Goal: Ability to identify and utilize available resources and services will improve Outcome: Not Progressing Goal: Ability to manage health-related needs will improve Outcome: Not Progressing   Problem: Metabolic: Goal: Ability to maintain appropriate glucose levels will improve Outcome: Not Progressing   Problem: Nutritional: Goal: Maintenance of adequate nutrition will improve Outcome: Not Progressing Goal: Progress toward achieving an optimal weight will improve Outcome: Not Progressing   Problem: Skin Integrity: Goal: Risk for impaired skin integrity will decrease Outcome: Not Progressing   Problem: Tissue Perfusion: Goal: Adequacy of tissue perfusion will improve Outcome:  Not Progressing

## 2023-02-20 NOTE — Progress Notes (Signed)
Eeg done. Off Propofol and Versed since 10:50am

## 2023-02-20 NOTE — Plan of Care (Signed)
  Problem: Nutrition: Goal: Adequate nutrition will be maintained Outcome: Progressing   Problem: Safety: Goal: Ability to remain free from injury will improve Outcome: Progressing   Problem: Skin Integrity: Goal: Risk for impaired skin integrity will decrease Outcome: Progressing   Problem: Cardiovascular: Goal: Ability to achieve and maintain adequate cardiovascular perfusion will improve Outcome: Progressing   Problem: Nutritional: Goal: Maintenance of adequate nutrition will improve Outcome: Progressing   Problem: Tissue Perfusion: Goal: Adequacy of tissue perfusion will improve Outcome: Progressing   Problem: Elimination: Goal: Will not experience complications related to bowel motility Outcome: Not Progressing

## 2023-02-20 NOTE — Progress Notes (Signed)
NAME:  James Moreno, MRN:  [id number], DOB:  06-05-1965, LOS: 2 ADMISSION DATE:  02/07/2023, CONSULTATION DATE:  02/16/2023 REFERRING MD:  Dr. Darrold Junker, CHIEF COMPLAINT:  STEMI, Out-of-Hospital Cardiac Arrest   Brief Pt Description / Synopsis:  57 y.o. male with PMHx significant for ESRD on HD admitted with Out-of-Hospital Cardiac Arrest (estimated total time of ACLS about 28 minutes) due to STEMI and cocaine intoxication.  Underwent emergent Cardiac Cath with drug eluting stent to ostial LAD.  Now exhibiting Myoclonus with concern for anoxic brain injury.  History of Present Illness:  James Moreno is a 57 y.o. male with past medical history significant for hypertension, diabetes, end stage renal disease status post renal transplant currently on hemodialysis who presented to [facility] ED with EMS in cardiac arrest.  EMS reports they were called out for shortness of breath.  They report that he seemed confused, gray in appearance on their arrival.  Patient sats were in the 70s on room air.  His spouse reports he did have dialysis on Saturday.  EMS obtained an EKG that showed anterior lateral ST elevation MI.  Code STEMI activated by EMS at 5:59 AM. He received full dose aspirin and 1 nitroglycerin to EMS.  Patient then became bradycardic in the 30s.  He then lost pulses and was in asystole.  CPR started at 6:09 AM.  EMS reports they continued resuscitative measures for about 15 minutes.  He received 2 rounds of epinephrine with EMS.  Patient being ventilated by BVM on arrival and receiving active chest compressions.  He was intubated by ED Provider.  ROSC obtain briefly, then he again became bradycardic, given 1 mg of Atropine, but progressed to PEA requiring another round of ACLS and CPR again.  Cardiology at bedside upon patient's arrival with plans to take him for emergent cardiac cath.  ED Course: Initial Vital Signs: RR 16 (via BVM), Pulse 87, BP 104/39, SpO2 95% via 100% FiO2 via BVM Significant  Labs: Sodium 128, potassium 3.4, chloride 93, glucose 621, BUN 39, creatinine 4.36, bicarb 23, anion gap 12, BNP 1572, high-sensitivity troponin 382, lactic acid 5.3, WBC 11.6, hemoglobin 10.3, hematocrit 31.1, beta hydroxybutyric acid 0.39 ABG postintubation: pH 7.34/pCO2 46/pO2 358/bicarb 25.6 Urine drug screen positive for cocaine Imaging Chest X-ray>>IMPRESSION: 1. ETT tip at the level the clavicles. Enteric tube courses to the abdomen, tip not included. Right IJ dialysis type catheter tips at the lower SVC level. Left IJ single lumen catheter terminates at the confluence of the innominate veins. 2. No pneumothorax on this supine view. Coarse and confluent bilateral perihilar and interstitial opacity is nonspecific. Top differential considerations include acute pulmonary edema, bilateral pneumonia. Medications Administered: Epinephrine and Levophed infusions  Cardiology evaluated the patient at bedside, and he was taken for emergent cardiac catheterization. Cath revealed revealed 99% stenosis ostial LAD, and chronically appearing 99% stenosis OM 2 with bridging collaterals. The patient underwent primary PCI receiving 3.0 x 8 mm Onyx frontier drug-eluting stent ostial LAD. Left ventriculography revealed fairly reduced left ventricular function with estimated LV ejection fraction of 25%.   Post cath he is being admitted to ICU.  Please see "Significant Hospital Events" section below for full detailed hospital course.   Pertinent  Medical History   Past Medical History:  Diagnosis Date   Acute kidney failure, unspecified (HCC)    Anxiety    Diabetes mellitus, type 2 (HCC)    GERD (gastroesophageal reflux disease)    History of arterial disease of lower extremity  History of hepatitis B    Hypertension    Hypothyroidism    Mitral valve disorders(424.0)    Pityriasis 12/27/2014   Primary pulmonary HTN (HCC)    Pt denies ever having.   Tricuspid valve disorders, specified as  nonrheumatic     Micro Data:  11/25: Urine>> 11/25: Blood cultures x2>>  Antimicrobials:   Anti-infectives (From admission, onward)    Start     Dose/Rate Route Frequency Ordered Stop   02/19/23 1400  cefTRIAXone (ROCEPHIN) 1 g in sodium chloride 0.9 % 100 mL IVPB        1 g 200 mL/hr over 30 Minutes Intravenous Every 24 hours 02/19/23 1042 02/07/2023 1359   02/12/2023 1230  cefTRIAXone (ROCEPHIN) 1 g in sodium chloride 0.9 % 100 mL IVPB  Status:  Discontinued        1 g 200 mL/hr over 30 Minutes Intravenous Every 24 hours 02/15/2023 1131 02/19/23 1042       Significant Hospital Events: Including procedures, antibiotic start and stop dates in addition to other pertinent events   11/25: Out-of-Hospital Cardiac arrest requiring about 28 minutes of ACLS.  Underwent emergent Cath, stent placed to ostial LAD.  Upon arrival to ICU exhibiting myoclonus concerning for anoxic brain injury.  Neurology and Nephrology consulted. 11/26: Neuro exam and MRI Brain concerning for anoxic brain injury. Repeat EEG pending.  Consult Palliative Care to assist with goals of care. 11/27: Neuro exam remains poor.  Repeat EEG revealed no seizure activity but suggestive of profound diffuse encephalopathy.  Palliative Care Team met with pts family and code status changed to DNR with no escalation of care   Interim History / Subjective:  As outlined above under significant events   Objective   Blood pressure (!) 180/75, pulse 81, temperature (!) 101.3 F (38.5 C), temperature source Axillary, resp. rate (!) 0, height 5' 10.98" (1.803 m), weight 102 kg, SpO2 93%.    Vent Mode: PRVC FiO2 (%):  [40 %] 40 % Set Rate:  [20 bmp] 20 bmp Vt Set:  [500 mL] 500 mL PEEP:  [5 cmH20] 5 cmH20   Intake/Output Summary (Last 24 hours) at 02/20/2023 1444 Last data filed at 02/20/2023 0725 Gross per 24 hour  Intake 1151.11 ml  Output 417 ml  Net 734.11 ml   Filed Weights   02/19/23 0500 02/20/23 0500 02/20/23 1412   Weight: 100.6 kg 102.1 kg 102 kg    Examination: General: Acutely-ill appearing male, NAD mechanically intubated  HENT: Supple, no JVD  Lungs: Clear throughout, even, non labored  Cardiovascular: NSR, s1s2, no m/r/g, 2+ radial/2+ distal pulses, no edema  Abdomen: +BS x4, soft, non distended  Extremities: Normal bulk and tone Neuro: Unresponsive, no brainstem reflexes (absent cough/gag and corneal reflexes), is on heavy sedation for myoclonus, left pupil pinpoint and nonreactive, right pupil ovoid 3 mm and nonreactive GU: Indwelling foley catheter present   Resolved Hospital Problem list     Assessment & Plan:   #Acute Metabolic Encephalopathy #Myoclonus #Concern for Anoxic Brain Injury #Cocaine intoxication #Sedation needs in setting of mechanical ventilation CT Head 11/26: CT head: Limited study due to presence of contrast from cardiac catheterization performed earlier today. Hypodense focus in the left side of the pons and left thalamus, concerning for infarct versus artifact. Correlation with MRI of the brain recommended.  MRI Brain 11/26: Concerning for widespread anoxic brain injury, remote lacunar infarcts at the left thalamus and pons, few small foci of susceptibility artifact of the left frontoparietal  region (no hemorrhage, but exam limited due to presence of IV contrast) - Maintain Normothermia - Maintain a RASS goal of 0 to -1 - PAD protocol to maintain RASS goal and/or to terminate myoclonus: prn fentanyl, versed, and propofol - Avoid sedating medications as able - Daily WUA - Continue keppra  - Repeat EEG 11/27: negative for seizure activity  - Neurology following, appreciate input   #Shock: Cardiogenic  #Out-of-Hospital Cardiac Arrest #STEMI #Acute Decompensated HFrEF PMHx: Hypertension S/p Cardiac Cath 11/25: Successful PCI with DES to ostial LAD, Residual 99% stenosis of OM2 which appeared chronic and collateralized, severe dilated cardiomyopathy with  estimated LV 25% - Continuous telemetry monitoring - Cautious iv fluids and vasopressors as needed to maintain map 65 or higher - Lactic acid is normalized (5.3 ~ 1 ~ 1.5) - HS Troponin peaked at 532 - Echocardiogram pending - Cardiology following, appreciate input - Volume removal with HD - Continue ASA and brilinta  #Acute Hypoxic Respiratory Failure due to STEMI and acute decompensated HFrEF - Full vent support, implement lung protective strategies - Plateau pressures less than 30 cm H20 - Wean FiO2 & PEEP as tolerated to maintain O2 sats >92% - Follow intermittent Chest X-ray & ABG as needed - Spontaneous Breathing Trials when respiratory parameters met and mental status permits - VAP bundle implemented  - Prn bronchodilator therapy  - Volume removal with HD  #ESRD on Hemodialysis #Hyponatremia #Hyperkalemia~resolved  PMHx: S/p Renal transplant - Trend BMP  - Replace electrolytes as indicated  - Strict intake and output  - Replace electrolytes as indicated ~ Pharmacy following for assistance with electrolyte replacement - Hold Cellcept for now - Nephrology consulted appreciate input: HD per recommendations   #Mild leukocytosis~improving likely reactive micro data negative thus far - Trend WBC and monitor fever curve - Follow culture results   #Type II diabetes mellitus  #Hypothyroidism - CBG's q4h - Target range of 140 to 180 - SSI - Follow ICU hypo/hyperglycemia protocol - Thyroid panel within normal limits   Patient is critically ill with multiorgan failure status post out-of-hospital cardiac arrest and STEMI.  Prognosis is extremely guarded with high risk for further decompensation, cardiac arrest and death.  Patient also exhibiting myoclonus and with concern for anoxic brain injury.  Given current critical illness superimposed on multiple chronic comorbidities, overall long-term prognosis is extremely poor.  Recommend DNR/DNI status.  Consult palliative care consult  to assist with goals of care conversations.  Best Practice (right click and "Reselect all SmartList Selections" daily)   Diet/type: Tube feeds DVT prophylaxis: prophylactic heparin  GI prophylaxis: PPI Lines: Central line, Arterial Line, and yes and it is still needed Foley:  Yes, and it is still needed Code Status: DO NOT RESUSCITATE  Last date of multidisciplinary goals of care discussion [02/20/23]  11/27: Updated pts daughter regarding pts condition and current plan of care.  All questions answered  Labs   CBC: Recent Labs  Lab 02/23/2023 0645 02/17/2023 0810 02/05/2023 1152 02/19/23 0358 02/20/23 0358  WBC 11.6*  --  19.8* 19.6* 13.1*  HGB 9.3* 10.9* 10.1* 9.2* 8.0*  HCT 31.1* 32.0* 31.4* 27.6* 24.4*  MCV 101.3*  --  94.3 91.7 93.5  PLT 181  --  224 177 152    Basic Metabolic Panel: Recent Labs  Lab 02/03/2023 0645 02/01/2023 0810 02/13/2023 2013 02/13/2023 2244 02/19/23 0217 02/19/23 0358 02/20/23 0358  NA 128*   < > 134* 133* 131* 131* 132*  K 3.4*   < > 3.7  3.6 4.1 4.3 4.0  CL 93*   < > 98 97* 96* 96* 97*  CO2 23   < > 26 26 23 23 24   GLUCOSE 621*   < > 135* 128* 150* 161* 199*  BUN 39*   < > 35* 32* 35* 36* 47*  CREATININE 4.36*   < > 2.95* 2.98* 3.52* 3.63* 4.79*  CALCIUM 9.7   < > 8.1* 8.0* 8.0* 8.1* 7.9*  MG 2.1  --   --   --   --  1.6* 2.0  PHOS  --   --   --   --   --  5.7* 6.4*   < > = values in this interval not displayed.   GFR: Estimated Creatinine Clearance: 20.7 mL/min (A) (by C-G formula based on SCr of 4.79 mg/dL (H)). Recent Labs  Lab 02/17/2023 0645 02/17/2023 1152 01/30/2023 1310 02/19/23 0358 02/20/23 0358  WBC 11.6* 19.8*  --  19.6* 13.1*  LATICACIDVEN 5.3* 1.0 1.5  --   --     Liver Function Tests: Recent Labs  Lab 02/16/2023 1152 02/19/23 0358 02/20/23 0358  AST 89* 35  --   ALT 97* 64*  --   ALKPHOS 124 94  --   BILITOT 0.6 0.6  --   PROT 5.0* 4.8*  --   ALBUMIN 2.8* 2.6*  2.7* 2.3*   No results for input(s): "LIPASE", "AMYLASE" in  the last 168 hours. No results for input(s): "AMMONIA" in the last 168 hours.  ABG    Component Value Date/Time   PHART 7.36 02/19/2023 0500   PCO2ART 39 02/19/2023 0500   PO2ART 159 (H) 02/19/2023 0500   HCO3 22.0 02/19/2023 0500   TCO2 27 02/03/2023 0810   ACIDBASEDEF 3.1 (H) 02/19/2023 0500   O2SAT 99.5 02/19/2023 0500     Coagulation Profile: No results for input(s): "INR", "PROTIME" in the last 168 hours.  Cardiac Enzymes: No results for input(s): "CKTOTAL", "CKMB", "CKMBINDEX", "TROPONINI" in the last 168 hours.  HbA1C: Hemoglobin A1C  Date/Time Value Ref Range Status  08/08/2022 12:00 AM 7.5  Final    Comment:    O'Connell  11/28/2021 12:00 AM 6.9  Final   Hgb A1c MFr Bld  Date/Time Value Ref Range Status  02/13/2023 11:52 AM 6.7 (H) 4.8 - 5.6 % Final    Comment:    (NOTE) Pre diabetes:          5.7%-6.4%  Diabetes:              >6.4%  Glycemic control for   <7.0% adults with diabetes     CBG: Recent Labs  Lab 02/19/23 1935 02/19/23 2307 02/20/23 0326 02/20/23 0912 02/20/23 1132  GLUCAP 142* 196* 195* 210* 172*    Review of Systems:   Unable to assess due to intubation/sedation/critical illness  Past Medical History:  He,  has a past medical history of Acute kidney failure, unspecified (HCC), Anxiety, Diabetes mellitus, type 2 (HCC), GERD (gastroesophageal reflux disease), History of arterial disease of lower extremity, History of hepatitis B, Hypertension, Hypothyroidism, Mitral valve disorders(424.0), Pityriasis (12/27/2014), Primary pulmonary HTN (HCC), and Tricuspid valve disorders, specified as nonrheumatic.   Surgical History:   Past Surgical History:  Procedure Laterality Date   APPENDECTOMY     Carotid Doppler Ultrasound  06/14/2009   39% stenosis of bilateral internal carotid artery, bilateral anterograde vertebral flow   CORONARY/GRAFT ACUTE MI REVASCULARIZATION N/A 02/15/2023   Procedure: Coronary/Graft Acute MI Revascularization;   Surgeon: Marcina Millard,  MD;  Location: [facility] INVASIVE CV LAB;  Service: Cardiovascular;  Laterality: N/A;   DIALYSIS/PERMA CATHETER INSERTION N/A 02/04/2023   Procedure: DIALYSIS/PERMA CATHETER INSERTION;  Surgeon: Annice Needy, MD;  Location: [facility] INVASIVE CV LAB;  Service: Cardiovascular;  Laterality: N/A;   ESOPHAGOGASTRODUODENOSCOPY (EGD) WITH PROPOFOL N/A 01/21/2019   Procedure: ESOPHAGOGASTRODUODENOSCOPY (EGD) WITH PROPOFOL;  Surgeon: Toney Reil, MD;  Location: [facility];  Service: Endoscopy;  Laterality: N/A;  Diabetic - insulin   EYE SURGERY     right   HEMORROIDECTOMY     KIDNEY TRANSPLANT Right 2016   LEFT HEART CATH AND CORONARY ANGIOGRAPHY N/A 02/17/2023   Procedure: LEFT HEART CATH AND CORONARY ANGIOGRAPHY;  Surgeon: Marcina Millard, MD;  Location: [facility] INVASIVE CV LAB;  Service: Cardiovascular;  Laterality: N/A;   MECKEL DIVERTICULUM EXCISION     infancy   Myocardial Perfusion scan  01/17/2009   [facility], non- ischemic. LVEF= 55%   PARS PLANA VITRECTOMY Left 02/09/2015   Procedure: Pan retinal photocoagulation 16109;  Surgeon: Marcelene Butte, MD;  Location: [facility] ORS;  Service: Ophthalmology;  Laterality: Left;   REFRACTIVE SURGERY Left    sleep study  01/09/2011   Severe sleep apnea. AHI 72.9/hr. RDI=83.0/hr. Desaturation to 69.0% Emergency CPAP titaration to 14.0cm (01/14/19 resolved after wt loss after kidney transplant.)     Social History:   reports that he has been smoking cigarettes. He has a 41 pack-year smoking history. He has never used smokeless tobacco. He reports that he does not currently use alcohol. He reports that he does not use drugs.   Family History:  His family history includes Hyperlipidemia in his mother; Hypertension in his mother; Melanoma in his father.   Allergies Allergies  Allergen Reactions   No Known Allergies      Home Medications  Prior to Admission medications   Medication Sig Start Date End  Date Taking? Authorizing Provider  alprazolam Prudy Feeler) 2 MG tablet TAKE 1 TABLET BY MOUTH THREE TIMES DAILY AS NEEDED FOR ANXIETY 01/21/23   [name], MD  amLODipine (NORVASC) 10 MG tablet Take 10 mg by mouth daily.  01/02/19   [provider]  atorvastatin (LIPITOR) 80 MG tablet Take 1 tablet (80 mg total) by mouth daily. 10/18/22   [name], MD  BD INSULIN SYRINGE U/F 31G X 5/16" 1 ML MISC  12/29/18   [provider]  calcitRIOL (ROCALTROL) 0.25 MCG capsule Take 1 capsule (0.25 mcg total) by mouth daily. 04/11/21   [name], MD  carvedilol (COREG) 12.5 MG tablet Take 1.5 tablets (18.75 mg total) by mouth 2 (two) times daily with a meal. 01/21/23   Gollan, Tollie Pizza, MD  chlorhexidine (PERIDEX) 0.12 % solution SMARTSIG:0.5 Capful(s) By Mouth Twice Daily Patient not taking: Reported on 02/04/2023 12/25/22   [provider]  cinacalcet (SENSIPAR) 30 MG tablet Take 30 mg by mouth daily. 11/22/22   [provider]  [name] 10 MG TABS tablet TAKE 1 TABLET BY MOUTH BEFORE BREAKFAST 10/23/22   [name], MD  insulin aspart (NOVOLOG) 100 UNIT/ML injection Inject 8 Units into the skin 3 (three) times daily before meals. and reports adjusting per sliding scale from Endocrinologist if blood sugar greater than 120 prior to mea    [provider]  insulin degludec (TRESIBA FLEXTOUCH) 100 UNIT/ML FlexTouch Pen Inject 39 Units into the skin daily. Patient receives via [name] Patient Assistance through Dec 2023 Patient taking differently: Inject 18 Units into the  skin daily. Patient receives via [name] Patient Assistance through Dec 2023 07/17/22   [name], MD  losartan (COZAAR) 100 MG tablet Take 1 tablet (100 mg total) by mouth at bedtime. 04/11/21   [name], MD  mycophenolate (CELLCEPT) 500 MG tablet Take 1,000 mg by mouth 2 (two) times daily.    [provider]  naloxone [facility]) nasal spray 4 mg/0.1 mL  Place 1 spray into the nose once.    [provider]  omeprazole (PRILOSEC) 20 MG capsule Take 20 mg by mouth daily as needed.    [provider]  oxyCODONE (OXY IR/ROXICODONE) 5 MG immediate release tablet Take 1-2 tablets (5-10 mg total) by mouth every 6 (six) hours as needed for severe pain (pain score 7-10). 02/05/23   [name], MD  Semaglutide,0.25 or 0.5MG /DOS, (OZEMPIC, 0.25 OR 0.5 MG/DOSE,) 2 MG/3ML SOPN Inject 0.5 mg into the skin once a week. Patient receives via [name] Patient Assistance Patient taking differently: Inject 1 mg into the skin once a week. Patient receives via [name] Patient Assistance 05/28/22   [name], MD  sildenafil (REVATIO) 20 MG tablet  05/26/21   [provider]  sildenafil (VIAGRA) 100 MG tablet Take 1 tab 1 hour prior to intercourse 10/31/22   Stoioff, Verna Czech, MD  tacrolimus (PROGRAF) 1 MG capsule Take 3 mg by mouth 2 (two) times daily.  01/11/15   [provider]  tamsulosin (FLOMAX) 0.4 MG CAPS capsule Take 1 capsule (0.4 mg total) by mouth daily. 10/18/22   [name], MD  tretinoin (RETIN-A) 0.05 % cream Apply topically as needed.     [provider]  triamcinolone cream (KENALOG) 0.1 % APPLY DAILY TO INFLAMED BUMPS AS NEEDED 10/06/18   [provider]  XYOSTED 50 MG/0.5ML SOAJ  05/26/21   [provider]     Critical care time: 42 minutes     Zada Girt, AGNP  Pulmonary/Critical Care Pager [phone] (please enter 7 digits) PCCM Consult Pager [phone] (please enter 7 digits)

## 2023-02-20 NOTE — Progress Notes (Signed)
[facility], [place] 02/20/23  Subjective:   LOS: 2  Patient known to our practice from outpatient follow-up for CKD.  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  Patient arrived to emergency room via EMS in cardiac arrest.  He had called out for shortness of breath, confusion and pallor. EMS notes indicate low oxygen saturation upon their arrival.  His last dialysis was Saturday.  EKG obtained by EMS showed anterior lateral STEMI.  He lost pulse and became asystolic.  Resuscitative measures were provided for about 15 minutes.  Patient was intubated in the emergency room. Nephrology consult has been requested for evaluation as patient's potassium level was elevated at 6.2 this morning. When seen, patient had myoclonic jerks concerning for anoxic brain injury.  He was evaluated by neurologist.  Patient remains critically ill, ventilator dependent Significant neurological concern of anoxic brain injury. Was requiring sedation earlier with propofol, fentanyl, Versed. Requiring Levophed for hemodynamic support  Objective:  Vital signs in last 24 hours:  Temp:  [98.1 F (36.7 C)-101.3 F (38.5 C)] 101.3 F (38.5 C) (11/27 1204) Pulse Rate:  [74-85] 84 (11/27 1445) Resp:  [0-27] 18 (11/27 1445) BP: (103-180)/(49-75) 177/54 (11/27 1515) SpO2:  [91 %-100 %] 93 % (11/27 1445) Arterial Line BP: (112-158)/(43-55) 138/43 (11/27 0800) FiO2 (%):  [40 %] 40 % (11/27 0347) Weight:  [102 kg-102.1 kg] 102 kg (11/27 1412)  Weight change: 2.8 kg Filed Weights   02/19/23 0500 02/20/23 0500 02/20/23 1412  Weight: 100.6 kg 102.1 kg 102 kg    Intake/Output:    Intake/Output Summary (Last 24 hours) at 02/20/2023 1530 Last data filed at 02/20/2023  0725 Gross per 24 hour  Intake 1112.55 ml  Output 417 ml  Net 695.55 ml     Physical Exam: General: Critically ill-appearing  HEENT ET tube in place  Pulm/lungs Ventilator assisted.    CVS/Heart Tachycardic, irregular  Abdomen:  Soft, nontender, nondistended  Extremities: Trace edema  Neurologic: Sedated  Skin: Warm  Access: Right IJ tunneled dialysis catheter       Basic Metabolic Panel:  Recent Labs  Lab 02/23/2023 0645 02/08/2023 0810 02/12/2023 2013 02/13/2023 2244 02/19/23 0217 02/19/23 0358 02/20/23 0358  NA 128*   < > 134* 133* 131* 131* 132*  K 3.4*   < > 3.7 3.6 4.1 4.3 4.0  CL 93*   < > 98 97* 96* 96* 97*  CO2 23   < > 26 26 23 23 24   GLUCOSE 621*   < > 135* 128* 150* 161* 199*  BUN 39*   < > 35* 32* 35* 36* 47*  CREATININE 4.36*   < > 2.95* 2.98* 3.52* 3.63* 4.79*  CALCIUM 9.7   < > 8.1* 8.0* 8.0* 8.1* 7.9*  MG 2.1  --   --   --   --  1.6* 2.0  PHOS  --   --   --   --   --  5.7* 6.4*   < > = values in this interval not displayed.     CBC: Recent Labs  Lab 02/15/2023 0645 02/03/2023 0810 01/27/2023 1152 02/19/23 0358 02/20/23 0358  WBC 11.6*  --  19.8* 19.6* 13.1*  HGB 9.3* 10.9* 10.1* 9.2* 8.0*  HCT 31.1* 32.0* 31.4* 27.6* 24.4*  MCV 101.3*  --  94.3 91.7 93.5  PLT 181  --  224 177 152      Lab Results  Component Value Date   HEPBSAG NON REACTIVE 02/17/2023      Microbiology:  Recent Results (from the past 240 hour(s))  Urine Culture     Status: None   Collection Time: 02/15/2023 10:51 AM   Specimen: Urine, Random  Result Value Ref Range Status   Specimen Description   Final    URINE, RANDOM Performed at [facility], [address], [place], [place] [address]    Special Requests   Final    NONE Reflexed from [id number] Performed at [facility], [address], [place], [place] [address]    Culture   Final    NO GROWTH Performed at [facility], [address], [place], [place] [address]    Report Status  02/19/2023 FINAL  Final  MRSA Next Gen by PCR, Nasal     Status: None   Collection Time: 01/30/2023 10:59 AM   Specimen: Nasal Mucosa; Nasal Swab  Result Value Ref Range Status   MRSA by PCR Next Gen NOT DETECTED NOT DETECTED Final    Comment: (NOTE) The GeneXpert MRSA Assay (FDA approved for NASAL specimens only), is one component of a comprehensive MRSA colonization surveillance program. It is not intended to diagnose MRSA infection nor to guide or monitor treatment for MRSA infections. Test performance is not FDA approved in patients less than 6 years old. Performed at [facility], [address] [place], [place] [address]   Culture, blood (Routine X 2) w Reflex to ID Panel     Status: None (Preliminary result)   Collection Time: 02/04/2023  1:12 PM   Specimen: BLOOD RIGHT HAND  Result Value Ref Range Status   Specimen Description BLOOD RIGHT HAND  Final   Special Requests   Final    Blood Culture results may not be optimal due to an excessive volume of blood received in culture bottles   Culture   Final    NO GROWTH 2 DAYS Performed at [facility], [address] [place], [place] [address]    Report Status PENDING  Incomplete  Culture, blood (Routine X 2) w Reflex to ID Panel     Status: None (Preliminary result)   Collection Time: 02/16/2023  1:22 PM   Specimen: A-Line; Blood  Result Value Ref Range Status   Specimen Description A-LINE  Final   Special Requests BCAV  Final   Culture   Final    NO GROWTH 2 DAYS Performed at [facility], [address] [place], [place] [address]    Report Status PENDING  Incomplete    Coagulation Studies: No results for input(s): "LABPROT", "INR" in the last 72 hours.  Urinalysis: Recent Labs    02/12/2023 1051  COLORURINE YELLOW*  YELLOW*  LABSPEC 1.026  1.026  PHURINE 5.0  6.0  GLUCOSEU >=500*  >=500*  HGBUR MODERATE*  MODERATE*  BILIRUBINUR NEGATIVE  NEGATIVE  KETONESUR NEGATIVE  NEGATIVE   PROTEINUR >=300*  >=300*  NITRITE NEGATIVE  NEGATIVE  LEUKOCYTESUR NEGATIVE  NEGATIVE      Imaging: EEG adult  Result Date: 02/20/2023 Charlsie Quest, MD     02/20/2023 11:59 AM Patient Name: BJORN TETLOW MRN: [id number] Epilepsy Attending: Charlsie Quest Referring Physician/Provider: Caryl Pina, MD Date: 02/20/2023 Duration: 34.49 mins Patient history: 57 year old male status post cardiac arrest.  EEG evaluate for seizure.  Level of  alertness: comatose  AEDs during EEG study: LEV, versed, propofol  Technical aspects: This EEG study was done with scalp electrodes positioned according to the 10-20 International system of electrode placement. Electrical activity was reviewed with band pass filter of 1-70Hz , sensitivity of 7 uV/mm, display speed of 58mm/sec with a 60Hz  notched filter applied as appropriate. EEG data were recorded continuously and digitally stored.  Video monitoring was available and reviewed as appropriate.  Description: EEG showed burst suppression with burst of 3-7hz  theta-delta slowing lasting 1-3 seconds alternating with 5-7 seconds of generalized background suppression. Hyperventilation and photic stimulation were not performed.    ABNORMALITY - Burst suppression, generalized  IMPRESSION: This study was suggestive of profound diffuse encephalopathy.  No seizures were noted.  Charlsie Quest   DG Abd Portable 1V  Result Date: 02/19/2023 CLINICAL DATA:  Feeding tube placement EXAM: PORTABLE ABDOMEN - 1 VIEW COMPARISON:  Abdominal x-ray 02/07/2023 FINDINGS: Enteric tube tip is in the proximal body of the stomach. There is a small amount of contrast in the stomach. Sidehole is now just below the level of the gastroesophageal junction. IMPRESSION: Enteric tube tip is in the proximal body of the stomach. Electronically Signed   By: Darliss Cheney M.D.   On: 02/19/2023 20:14   EEG adult  Result Date: 02/19/2023 Charlsie Quest, MD     02/19/2023  3:57 PM Patient Name:  CHARLES BRINKERHOFF MRN: [id number] Epilepsy Attending: Charlsie Quest Referring Physician/Provider: Caryl Pina, MD Date: 02/19/2023 Duration: 28.20 mins  Patient history: 57 year old male status post cardiac arrest.  EEG evaluate for seizure.  Level of alertness: comatose  AEDs during EEG study: LEV, versed, propofol  Technical aspects: This EEG study was done with scalp electrodes positioned according to the 10-20 International system of electrode placement. Electrical activity was reviewed with band pass filter of 1-70Hz , sensitivity of 7 uV/mm, display speed of 73mm/sec with a 60Hz  notched filter applied as appropriate. EEG data were recorded continuously and digitally stored.  Video monitoring was available and reviewed as appropriate.  Description: EEG showed burst suppression with burst of 3-7hz  theta-delta slowing lasting 5-10 seconds alternating with 10-15 seconds of generalized background suppression. Hyperventilation and photic stimulation were not performed.    ABNORMALITY - Burst suppression, generalized  IMPRESSION: This study was suggestive of profound diffuse encephalopathy.  No seizures were noted.  Charlsie Quest   DG Chest Port 1 View  Result Date: 02/19/2023 CLINICAL DATA:  Acute respiratory failure, hypoxia EXAM: PORTABLE CHEST 1 VIEW COMPARISON:  01/26/2023 FINDINGS: Two frontal views of the chest demonstrate stable endotracheal tube, enteric catheter, and right internal jugular dialysis catheter. Left internal jugular catheter tip again projects over the brachiocephalic confluence. Cardiac silhouette is stable. There is persistent pulmonary vascular congestion, likely not significantly changed given differences in patient positioning and technique. No effusion or pneumothorax. No acute bony abnormalities. IMPRESSION: 1. Support devices as above. 2. Pulmonary vascular congestion, with no significant change in volume status. Electronically Signed   By: Sharlet Salina M.D.   On: 02/19/2023  09:04   MR BRAIN WO CONTRAST  Result Date: 02/19/2023 CLINICAL DATA:  Follow-up examination for stroke. EXAM: MRI HEAD WITHOUT CONTRAST TECHNIQUE: Multiplanar, multiecho pulse sequences of the brain and surrounding structures were obtained without intravenous contrast. COMPARISON:  Prior CT from 02/12/2023. FINDINGS: Brain: Cerebral volume within normal limits. Patchy T2/FLAIR hyperintensity involving the periventricular and deep white matter of both cerebral hemispheres as well as the pons, consistent with chronic small vessel  ischemic disease. Remote lacunar infarcts present at the left thalamus and pons. Extensive diffusion signal abnormality seen involving the cortical gray matter of both cerebral hemispheres, slightly worse on the right. Symmetric involvement of the bilateral basal ganglia and thalami. Diffusion signal abnormality seen throughout the cerebellum. Associated T2/FLAIR signal abnormality throughout the areas affected. Findings consistent with widespread anoxic brain injury. No definite superimposed vascular infarct. Multiple scattered punctate foci of susceptibility artifact noted, consistent with small chronic micro hemorrhages, likely hypertensive in nature. Few small foci of susceptibility artifact noted within the left frontoparietal region. No visible acute hemorrhage seen at these locations on prior head CT. No mass lesion or midline shift. No hydrocephalus or extra-axial fluid collection. Pituitary gland and suprasellar region within normal limits. Vascular: Major intracranial vascular flow voids are maintained. Skull and upper cervical spine: Craniocervical junction within normal limits. Decreased T1 signal intensity seen within the visualized bone marrow, nonspecific, but most commonly related to anemia, smoking, or obesity. No scalp soft tissue abnormality. Sinuses/Orbits: Prior ocular lens replacement on the right. Scattered mucosal thickening present throughout the paranasal  sinuses. Trace bilateral mastoid effusions. Patient is intubated. Other: None. IMPRESSION: 1. Findings consistent with widespread anoxic brain injury as detailed above. 2. Underlying chronic microvascular ischemic disease with remote lacunar infarcts at the left thalamus and pons. 3. Few small foci of susceptibility artifact involving the left frontoparietal region. While no visible hemorrhage is seen or these locations on prior head CT, that exam was somewhat limited due to the presence of IV contrast material related to prior cardiac catheterization. Correlation with repeat noncontrast head CT suggested to evaluate for possible trace subarachnoid hemorrhage as warranted. Electronically Signed   By: Rise Mu M.D.   On: 02/19/2023 06:54     Medications:    cefTRIAXone (ROCEPHIN)  IV Stopped (02/19/23 1548)   feeding supplement (VITAL 1.5 CAL) 35 mL/hr at 02/20/23 0725   fentaNYL infusion INTRAVENOUS 100 mcg/hr (02/20/23 0725)   levETIRAcetam 1,000 mg (02/20/23 1135)   levETIRAcetam     levETIRAcetam     midazolam Stopped (02/20/23 1050)   norepinephrine (LEVOPHED) Adult infusion 3 mcg/min (02/20/23 1508)   propofol (DIPRIVAN) infusion Stopped (02/20/23 1051)    [START ON 02/17/2023] aspirin  81 mg Per Tube Daily   Chlorhexidine Gluconate Cloth  6 each Topical Q0600   docusate  100 mg Per Tube BID   feeding supplement (PROSource TF20)  60 mL Per Tube BID   free water  30 mL Per Tube Q4H   heparin injection (subcutaneous)  5,000 Units Subcutaneous Q8H   insulin aspart  0-6 Units Subcutaneous Q4H   multivitamin  1 tablet Per Tube QHS   mouth rinse  15 mL Mouth Rinse Q2H   pantoprazole (PROTONIX) IV  40 mg Intravenous Daily   polyethylene glycol  17 g Per Tube Daily   sodium chloride flush  3 mL Intravenous Q12H   sodium chloride flush  3 mL Intravenous Q12H   ticagrelor  90 mg Per Tube BID   acetaminophen, alteplase, fentaNYL, heparin, heparin, levETIRAcetam, midazolam, mouth  rinse, sodium chloride flush, sodium chloride flush  Assessment/ Plan:  57 y.o. male with  history of end-stage renal disease ,peritoneal dialysis, history of hepatitis B infection, history of positive PPD treated with INH/rifampin in the past, renal transplantation April 2016 which failed recently-hemodialysis restarted, current smoker was admitted on 02/05/2023 for  Principal Problem:   Acute ST elevation myocardial infarction (STEMI) involving left anterior descending (LAD) coronary artery (HCC)  Active Problems:   Cardiac arrest (HCC)  Acute ST elevation myocardial infarction (STEMI) involving left anterior descending (LAD) coronary artery (HCC) [I21.02]  #. ESRD with hyperkalemia Routine hemodialysis done today. Urine tox screen positive for cocaine. Concerns about anoxic brain injury as reported on CT and by EEG evidence. Neurology team following closely.  #. Anemia of CKD  Lab Results  Component Value Date   HGB 8.0 (L) 02/20/2023   Low dose EPO with HD if hemoglobin is trending lower.  #. Secondary hyperparathyroidism of renal origin N 25.81   No results found for: "PTH" Lab Results  Component Value Date   PHOS 6.4 (H) 02/20/2023   Monitor calcium and phos level during this admission  #Acute respiratory failure Ventilator dependent at present FiO2 30%, PEEP 5. Currently getting tube feeds at 35 cc/h.   #. Diabetes type 2 with CKD Hemoglobin A1C (no units)  Date Value  08/08/2022 7.5   Hgb A1c MFr Bld (%)  Date Value  02/06/2023 6.7 (H)  Currently managed with NovoLog.  #STEMI Complicated by cardiac arrest, 15 minutes of ACLS, acute respiratory failure requiring ventilator support, possible anoxic brain injury. -Patient is status post emergent cardiac catheterization showing severe ostial LAD stenosis and subtotal occlusion of the mid left circumflex, EF of 20 to 25%.  Difficult PCI to ostial LAD.    LOS: 2 Ayiana Winslett Thedore Mins 11/27/20243:30 PM  [facility]  [address] [place], [place] [phone]

## 2023-02-21 ENCOUNTER — Inpatient Hospital Stay
Admit: 2023-02-21 | Discharge: 2023-02-21 | Disposition: A | Discharge status: Home / Self Care | Payer: Medicare HMO | Attending: Cardiology | Admitting: Cardiology

## 2023-02-21 DIAGNOSIS — I5021 Acute systolic (congestive) heart failure: Secondary | ICD-10-CM | POA: Insufficient documentation

## 2023-02-21 DIAGNOSIS — I213 ST elevation (STEMI) myocardial infarction of unspecified site: Secondary | ICD-10-CM | POA: Diagnosis not present

## 2023-02-21 DIAGNOSIS — E039 Hypothyroidism, unspecified: Secondary | ICD-10-CM | POA: Insufficient documentation

## 2023-02-21 DIAGNOSIS — G253 Myoclonus: Secondary | ICD-10-CM | POA: Insufficient documentation

## 2023-02-21 DIAGNOSIS — Z7189 Other specified counseling: Secondary | ICD-10-CM | POA: Diagnosis not present

## 2023-02-21 DIAGNOSIS — I2102 ST elevation (STEMI) myocardial infarction involving left anterior descending coronary artery: Secondary | ICD-10-CM | POA: Diagnosis not present

## 2023-02-21 DIAGNOSIS — R570 Cardiogenic shock: Secondary | ICD-10-CM | POA: Insufficient documentation

## 2023-02-21 DIAGNOSIS — I469 Cardiac arrest, cause unspecified: Secondary | ICD-10-CM | POA: Diagnosis not present

## 2023-02-21 DIAGNOSIS — Z9911 Dependence on respirator [ventilator] status: Secondary | ICD-10-CM

## 2023-02-21 DIAGNOSIS — J9601 Acute respiratory failure with hypoxia: Secondary | ICD-10-CM | POA: Diagnosis not present

## 2023-02-21 DIAGNOSIS — Z66 Do not resuscitate: Secondary | ICD-10-CM

## 2023-02-21 DIAGNOSIS — F14129 Cocaine abuse with intoxication, unspecified: Secondary | ICD-10-CM | POA: Insufficient documentation

## 2023-02-21 DIAGNOSIS — E875 Hyperkalemia: Secondary | ICD-10-CM | POA: Insufficient documentation

## 2023-02-21 DIAGNOSIS — G931 Anoxic brain damage, not elsewhere classified: Secondary | ICD-10-CM | POA: Diagnosis not present

## 2023-02-21 DIAGNOSIS — E871 Hypo-osmolality and hyponatremia: Secondary | ICD-10-CM | POA: Insufficient documentation

## 2023-02-21 LAB — CBC
HCT: 25.6 % — ABNORMAL LOW (ref 39.0–52.0)
Hemoglobin: 8.1 g/dL — ABNORMAL LOW (ref 13.0–17.0)
MCH: 30.5 pg (ref 26.0–34.0)
MCHC: 31.6 g/dL (ref 30.0–36.0)
MCV: 96.2 fL (ref 80.0–100.0)
Platelets: 154 10*3/uL (ref 150–400)
RBC: 2.66 MIL/uL — ABNORMAL LOW (ref 4.22–5.81)
RDW: 14.6 % (ref 11.5–15.5)
WBC: 9.8 10*3/uL (ref 4.0–10.5)
nRBC: 0 % (ref 0.0–0.2)

## 2023-02-21 LAB — RENAL FUNCTION PANEL
Albumin: 2.4 g/dL — ABNORMAL LOW (ref 3.5–5.0)
Anion gap: 12 (ref 5–15)
BUN: 42 mg/dL — ABNORMAL HIGH (ref 6–20)
CO2: 26 mmol/L (ref 22–32)
Calcium: 8.1 mg/dL — ABNORMAL LOW (ref 8.9–10.3)
Chloride: 97 mmol/L — ABNORMAL LOW (ref 98–111)
Creatinine, Ser: 3.99 mg/dL — ABNORMAL HIGH (ref 0.61–1.24)
GFR, Estimated: 17 mL/min — ABNORMAL LOW (ref >60)
Glucose, Bld: 270 mg/dL — ABNORMAL HIGH (ref 70–99)
Phosphorus: 5.9 mg/dL — ABNORMAL HIGH (ref 2.5–4.6)
Potassium: 4.1 mmol/L (ref 3.5–5.1)
Sodium: 135 mmol/L (ref 135–145)

## 2023-02-21 LAB — GLUCOSE, CAPILLARY
Glucose-Capillary: 262 mg/dL — ABNORMAL HIGH (ref 70–99)
Glucose-Capillary: 293 mg/dL — ABNORMAL HIGH (ref 70–99)
Glucose-Capillary: 335 mg/dL — ABNORMAL HIGH (ref 70–99)
Glucose-Capillary: 306 mg/dL — ABNORMAL HIGH (ref 70–99)

## 2023-02-21 LAB — ECHOCARDIOGRAM COMPLETE
AR max vel: 1.59 cm2
AV Area VTI: 1.38 cm2
AV Area mean vel: 1.36 cm2
AV Mean grad: 8 mm[Hg]
AV Peak grad: 14.6 mm[Hg]
Ao pk vel: 1.91 m/s
Area-P 1/2: 5.27 cm2
Calc EF: 33.6 %
Height: 70.984 in
MV VTI: 1.77 cm2
S' Lateral: 5.1 cm
Single Plane A2C EF: 32.1 %
Single Plane A4C EF: 32.1 %
Weight: 3601.43 [oz_av]

## 2023-02-21 LAB — MAGNESIUM: Magnesium: 2.1 mg/dL (ref 1.7–2.4)

## 2023-02-21 MED ORDER — HYDRALAZINE HCL 50 MG PO TABS
25.0000 mg | ORAL_TABLET | Freq: Three times a day (TID) | ORAL | Status: DC
  Administered 2023-02-21: 25 mg
  Filled 2023-02-21: qty 1

## 2023-02-21 MED ORDER — BIOTENE DRY MOUTH MT LIQD: 15.0000 mL | Freq: Three times a day (TID) | OROMUCOSAL | Status: DC

## 2023-02-21 MED ORDER — HYDRALAZINE HCL 20 MG/ML IJ SOLN
10.0000 mg | INTRAMUSCULAR | Status: DC | PRN
  Administered 2023-02-21 (×3): 10 mg via INTRAVENOUS
  Filled 2023-02-21 (×3): qty 1

## 2023-02-21 MED ORDER — HYDRALAZINE HCL 50 MG PO TABS: 25.0000 mg | ORAL_TABLET | Freq: Three times a day (TID) | ORAL | Status: DC

## 2023-02-21 MED ORDER — HYDROMORPHONE BOLUS VIA INFUSION: 1.0000 mg | INTRAVENOUS | Status: DC | PRN

## 2023-02-21 MED ORDER — POLYVINYL ALCOHOL 1.4 % OP SOLN: 1.0000 [drp] | Freq: Four times a day (QID) | OPHTHALMIC | Status: DC | PRN

## 2023-02-21 MED ORDER — LABETALOL HCL 5 MG/ML IV SOLN
20.0000 mg | Freq: Once | INTRAVENOUS | Status: AC
  Administered 2023-02-21: 20 mg via INTRAVENOUS
  Filled 2023-02-21: qty 4

## 2023-02-21 MED ORDER — PIPERACILLIN-TAZOBACTAM 3.375 G IVPB: 3.3750 g | Freq: Three times a day (TID) | INTRAVENOUS | Status: DC

## 2023-02-21 MED ORDER — PIPERACILLIN-TAZOBACTAM IN DEX 2-0.25 GM/50ML IV SOLN
2.2500 g | Freq: Three times a day (TID) | INTRAVENOUS | Status: DC
  Filled 2023-02-21: qty 50

## 2023-02-21 MED ORDER — GLYCOPYRROLATE 0.2 MG/ML IJ SOLN: 0.2000 mg | INTRAMUSCULAR | Status: DC | PRN

## 2023-02-21 MED ORDER — GLYCOPYRROLATE 1 MG PO TABS: 1.0000 mg | ORAL_TABLET | ORAL | Status: DC | PRN

## 2023-02-21 MED ORDER — VANCOMYCIN HCL IN DEXTROSE 1-5 GM/200ML-% IV SOLN: 1000.0000 mg | INTRAVENOUS | Status: DC | PRN

## 2023-02-21 MED ORDER — VANCOMYCIN HCL 1750 MG/350ML IV SOLN
1750.0000 mg | Freq: Once | INTRAVENOUS | Status: DC
  Filled 2023-02-21: qty 350

## 2023-02-21 MED ORDER — HYDROMORPHONE BOLUS VIA INFUSION
1.0000 mg | INTRAVENOUS | Status: DC
  Filled 2023-02-21 (×17): qty 1

## 2023-02-21 MED ORDER — MIDAZOLAM BOLUS VIA INFUSION: 0.0000 mg | INTRAVENOUS | Status: DC | PRN

## 2023-02-21 MED ORDER — INSULIN ASPART 100 UNIT/ML IJ SOLN
0.0000 [IU] | INTRAMUSCULAR | Status: DC
  Administered 2023-02-21: 7 [IU] via SUBCUTANEOUS
  Administered 2023-02-21 (×2): 5 [IU] via SUBCUTANEOUS
  Filled 2023-02-21 (×3): qty 1

## 2023-02-21 MED ORDER — HYDROMORPHONE HCL-NACL 50-0.9 MG/50ML-% IV SOLN
1.0000 mg/h | INTRAVENOUS | Status: DC
  Administered 2023-02-21: 2 mg/h via INTRAVENOUS
  Filled 2023-02-21: qty 50

## 2023-02-21 MED ORDER — HYDROMORPHONE HCL 1 MG/ML IJ SOLN
1.0000 mg | INTRAMUSCULAR | Status: AC
  Administered 2023-02-21: 1 mg via INTRAVENOUS
  Filled 2023-02-21: qty 1

## 2023-02-23 LAB — CULTURE, BLOOD (ROUTINE X 2)
Culture: NO GROWTH
Culture: NO GROWTH

## 2023-02-24 NOTE — Progress Notes (Addendum)
   Patient Name: James Moreno Date of Encounter: 02/12/2023 [facility] Cardiologist: Julien Nordmann, MD   Interval Summary  .    Patient remains intubated and unresponsive.  Sedation held since yesterday, though it appears to have been restarted this morning due to overbreathing of the ventilator.  Family is at the bedside and mentions that they will likely transition to comfort care.  Vital Signs .    Vitals:   02/04/2023 0700 02/20/2023 0701 01/29/2023 0822 02/05/2023 0847  BP: (!) 142/61   (!) 193/60  Pulse: 96 96    Resp: 20 20    Temp:  (!) 101.4 F (38.6 C)    TempSrc:  Axillary    SpO2: 93% 92% 93%   Weight:      Height:        Intake/Output Summary (Last 24 hours) at 01/31/2023 1113 Last data filed at 01/25/2023 0600 Gross per 24 hour  Intake 1521.48 ml  Output 2595 ml  Net -1073.52 ml      02/20/2023    3:50 PM 02/20/2023    2:12 PM 02/20/2023    5:00 AM  Last 3 Weights  Weight (lbs) 220 lb 10.9 oz 224 lb 13.9 oz 225 lb 1.4 oz  Weight (kg) 100.1 kg 102 kg 102.1 kg      Telemetry/ECG    Normal sinus rhythm - Personally Reviewed  Physical Exam .   GEN: Intubated and unresponsive. Neck: Internal jugular central venous catheter present.  Unable to assess JVP. Cardiac: Regular rate and rhythm without murmurs. Respiratory: Clear lungs anteriorly. GI: Soft, nontender, non-distended  MS: Trace pretibial edema bilaterally.  Assessment & Plan .     Anterolateral STEMI: Patient admitted with anterolateral STEMI following cardiac arrest.  He underwent primary PCI to the proximal LAD.  He was also noted to have severe OM disease, thought to be chronic. -Continue aspirin and ticagrelor. -Statin currently being deferred due to mild transaminitis and severe neurologic dysfunction after cardiac arrest.  Consider adding statin if the patient has any meaningful neurologic recovery. -Patient is not a candidate for cardiac rehab given his severe neurologic  dysfunction.  Acute HFrEF: LVEF severely reduced by left ventriculogram immediately following cardiac arrest.  He has mild pretibial edema on exam but otherwise appears euvolemic.  He is net +2 L this admission but was negative about a liter yesterday.  Echocardiogram performed this morning; interpretation is still pending. -Follow-up echocardiogram. -Defer beta-blocker in the setting of acute decompensated heart failure. -Defer ACE inhibitor/ARB and aldosterone antagonist in the setting of ESRD and severe neurologic dysfunction following cardiac arrest.  If neurologic status improves, judicious introduction of ARB could be considered. -Add hydralazine 25 mg 3 times daily for blood pressure control and afterload reduction.  Could consider adding long-acting nitrate as well based on response to hydralazine.  Cardiac arrest and anoxic brain injury: Patient with prolonged cardiac arrest and anoxic brain injury noted on MRI.  EEGs without further seizure activity.  Neurology following, though assessment has been complicated by remittent sedation.  No further arrhythmia noted. -Continue ongoing neurology monitoring. -No role for amiodarone at this time. -Could consider addition of low-dose beta-blocker based on blood pressure and volume status moving forward.  Geoffrey Hynes-stage renal disease: -Per nephrology.  For questions or updates, please contact [facility] Please consult [email or web address] for contact info under [facility].     Signed, Yvonne Kendall, MD

## 2023-02-24 NOTE — Plan of Care (Signed)
  Problem: Education: Goal: Knowledge of General Education information will improve Description: Including pain rating scale, medication(s)/side effects and non-pharmacologic comfort measures Outcome: Not Progressing   Problem: Health Behavior/Discharge Planning: Goal: Ability to manage health-related needs will improve Outcome: Not Progressing   Problem: Clinical Measurements: Goal: Ability to maintain clinical measurements within normal limits will improve Outcome: Not Progressing Goal: Will remain free from infection Outcome: Not Progressing Goal: Diagnostic test results will improve Outcome: Not Progressing Goal: Respiratory complications will improve Outcome: Not Progressing Goal: Cardiovascular complication will be avoided Outcome: Not Progressing   Problem: Activity: Goal: Risk for activity intolerance will decrease Outcome: Not Progressing   Problem: Nutrition: Goal: Adequate nutrition will be maintained Outcome: Not Progressing   Problem: Coping: Goal: Level of anxiety will decrease Outcome: Not Progressing   Problem: Elimination: Goal: Will not experience complications related to bowel motility Outcome: Not Progressing Goal: Will not experience complications related to urinary retention Outcome: Not Progressing   Problem: Pain Management: Goal: General experience of comfort will improve Outcome: Not Progressing   Problem: Safety: Goal: Ability to remain free from injury will improve Outcome: Not Progressing   Problem: Skin Integrity: Goal: Risk for impaired skin integrity will decrease Outcome: Not Progressing   Problem: Activity: Goal: Ability to tolerate increased activity will improve Outcome: Not Progressing   Problem: Respiratory: Goal: Ability to maintain a clear airway and adequate ventilation will improve Outcome: Not Progressing   Problem: Role Relationship: Goal: Method of communication will improve Outcome: Not Progressing   Problem:  Education: Goal: Understanding of CV disease, CV risk reduction, and recovery process will improve Outcome: Not Progressing Goal: Individualized Educational Video(s) Outcome: Not Progressing   Problem: Activity: Goal: Ability to return to baseline activity level will improve Outcome: Not Progressing   Problem: Cardiovascular: Goal: Ability to achieve and maintain adequate cardiovascular perfusion will improve Outcome: Not Progressing Goal: Vascular access site(s) Level 0-1 will be maintained Outcome: Not Progressing   Problem: Health Behavior/Discharge Planning: Goal: Ability to safely manage health-related needs after discharge will improve Outcome: Not Progressing   Problem: Education: Goal: Ability to describe self-care measures that may prevent or decrease complications (Diabetes Survival Skills Education) will improve Outcome: Not Progressing Goal: Individualized Educational Video(s) Outcome: Not Progressing   Problem: Coping: Goal: Ability to adjust to condition or change in health will improve Outcome: Not Progressing   Problem: Fluid Volume: Goal: Ability to maintain a balanced intake and output will improve Outcome: Not Progressing   Problem: Health Behavior/Discharge Planning: Goal: Ability to identify and utilize available resources and services will improve Outcome: Not Progressing Goal: Ability to manage health-related needs will improve Outcome: Not Progressing   Problem: Metabolic: Goal: Ability to maintain appropriate glucose levels will improve Outcome: Not Progressing   Problem: Nutritional: Goal: Maintenance of adequate nutrition will improve Outcome: Not Progressing Goal: Progress toward achieving an optimal weight will improve Outcome: Not Progressing   Problem: Skin Integrity: Goal: Risk for impaired skin integrity will decrease Outcome: Not Progressing   Problem: Tissue Perfusion: Goal: Adequacy of tissue perfusion will improve Outcome:  Not Progressing

## 2023-02-24 NOTE — Progress Notes (Signed)
PHARMACY CONSULT NOTE - FOLLOW UP  Pharmacy Consult for Electrolyte Monitoring and Replacement   Recent Labs: Potassium (mmol/L)  Date Value  02/03/2023 4.1  06/21/2013 4.1   Magnesium (mg/dL)  Date Value  74/25/9563 2.1   Calcium (mg/dL)  Date Value  87/56/4332 8.1 (L)   Calcium, Total (mg/dL)  Date Value  95/18/8416 7.3 (L)   Albumin (g/dL)  Date Value  60/63/0160 2.4 (L)  02/12/2022 3.9  06/21/2013 3.1 (L)   Phosphorus (mg/dL)  Date Value  10/93/2355 5.9 (H)   Sodium (mmol/L)  Date Value  02/04/2023 135  02/12/2022 130 (L)  06/21/2013 132 (L)   Corrected Ca: 8.7 mg/dL  Assessment: 57 y.o. male w/ PMH of  HTN, DM, ESRD s/p renal transplant currently on hemodialysis who presents to the emergency department in cardiac arrest.   Goal of Therapy:  Potassium 4.0 - 5.1 mmol/L Magnesium 2.0 - 2.4 mg/dL All Other Electrolytes WNL  Plan:  ---no electrolyte replacement warranted for today ---recheck BMP and magnesium in am  James Moreno ,PharmD Clinical Pharmacist 01/31/2023 6:38 AM

## 2023-02-24 NOTE — Plan of Care (Addendum)
Still requiring fentanyl for vent management and propofol is now being added back due to overbreathing of the vent.   Unable to obtain a valid Neuroprognostic exam today due to continued sedation. Neuroprognostic exam will need to be performed once off all sedation for a total of 24 hours. If unable to wean off sedation, then prognostication will be more reliant upon his EEG and MRI findings.   Addendum: Palliative met with family today and family stated that they really don't want to continue, saying patient would not want this. They are planning for terminal extubation later today. They do not want any more imaging/eeg/labs   Electronically signed: Dr. Caryl Pina

## 2023-02-24 NOTE — Death Summary Note (Signed)
DEATH SUMMARY   Patient Details  Name: James Moreno MRN: [id number] DOB: 1965-11-01  Admission/Discharge Information   Admit Date:  2023/03/03  Date of Death: Date of Death: 2023/03/06  Time of Death: Time of Death: 03/08/09  Length of Stay: 3  Referring Physician: [name], MD   Reason(s) for Hospitalization  Out of hospital cardiac arrest  Diagnoses  Preliminary cause of death: CAD Secondary Diagnoses (including complications and co-morbidities):  Principal Problem:   Acute ST elevation myocardial infarction (STEMI) involving left anterior descending (LAD) coronary artery [facility]) Active Problems:   Type 2 diabetes mellitus with diabetic nephropathy (HCC)   Carotid arterial disease (HCC)   ESRD on hemodialysis (HCC)   Cardiac arrest (HCC)   Acute respiratory failure with hypoxia (HCC)   ST elevation myocardial infarction (STEMI) (HCC)   Anoxic brain injury (HCC)   Dilated cardiomyopathy (HCC)   Acute HFrEF (heart failure with reduced ejection fraction) (HCC)   On mechanically assisted ventilation (HCC)   Hyponatremia   Hyperkalemia   Hypothyroidism   Cocaine abuse with intoxication (HCC)   Myoclonus   Cardiogenic shock [facility])   Brief Hospital Course (including significant findings, care, treatment, and services provided and events leading to death)  James Moreno is a 57 y.o. year old male  with past medical history significant for hypertension, diabetes, end stage renal disease status post renal transplant currently on hemodialysis who presented to [facility] ED with EMS in cardiac arrest.  EMS reports they were called out for shortness of breath.  They report that he seemed confused, gray in appearance on their arrival.  Patient sats were in the 70s on room air.  His spouse reports he did have dialysis on Saturday.  EMS obtained an EKG that showed anterior lateral ST elevation MI.  Code STEMI activated by EMS at 5:59 AM. He received full dose aspirin and 1 nitroglycerin  to EMS.  Patient then became bradycardic in the 30s.  He then lost pulses and was in asystole.  CPR started at 6:09 AM.  EMS reports they continued resuscitative measures for about 15 minutes.  He received 2 rounds of epinephrine with EMS.  Patient being ventilated by BVM on arrival and receiving active chest compressions.  He was intubated by ED Provider.  ROSC obtain briefly, then he again became bradycardic, given 1 mg of Atropine, but progressed to PEA requiring another round of ACLS and CPR again.  Cardiology at bedside upon patient's arrival with plans to take him for emergent cardiac cath.   ED Course: Initial Vital Signs: RR 16 (via BVM), Pulse 87, BP 104/39, SpO2 95% via 100% FiO2 via BVM Significant Labs: Sodium 128, potassium 3.4, chloride 93, glucose 621, BUN 39, creatinine 4.36, bicarb 23, anion gap 12, BNP 1572, high-sensitivity troponin 382, lactic acid 5.3, WBC 11.6, hemoglobin 10.3, hematocrit 31.1, beta hydroxybutyric acid 0.39 ABG postintubation: pH 7.34/pCO2 46/pO2 358/bicarb 25.6 Urine drug screen positive for cocaine.  Medications Administered: Epinephrine and Levophed infusions   Cardiology evaluated the patient at bedside, and he was taken for emergent cardiac catheterization. Cath revealed revealed 99% stenosis ostial LAD, and chronically appearing 99% stenosis OM 2 with bridging collaterals. The patient underwent primary PCI receiving 3.0 x 8 mm Onyx frontier drug-eluting stent ostial LAD. Left ventriculography revealed fairly reduced left ventricular function with estimated LV ejection fraction of 25%.    Post cath he is being admitted to ICU.  Mar 03, 2023: Out-of-Hospital Cardiac arrest requiring about 28 minutes of ACLS.  Underwent emergent Cath, stent placed to ostial LAD.  Upon arrival to ICU exhibiting myoclonus concerning for anoxic brain injury.  Neurology and Nephrology consulted. 11/26: Neuro exam and MRI Brain concerning for anoxic brain injury. Repeat EEG pending.   Consult Palliative Care to assist with goals of care. 11/27: Neuro exam remains poor.  Repeat EEG revealed no seizure activity but suggestive of profound diffuse encephalopathy.  Palliative Care Team met with pts family and code status changed to DNR with no escalation of care  11/28: Neuro exam remains poor despite weaning off versed/propofol gtts remains on fentanyl gtt @100  mcg/hr.  Now hypertensive sbp 170-190's prn hydralazine ordered for bp management. Patient transitioned to comfort measures only per family request, passing with family bedside.      Pertinent Labs and Studies  Significant Diagnostic Studies ECHOCARDIOGRAM COMPLETE  Result Date: 01/25/2023    ECHOCARDIOGRAM REPORT   Patient Name:   James Moreno Date of Exam: 02/03/2023 Medical Rec #:  [id number]       Height:       71.0 in Accession #:    [id number]      Weight:       220.7 lb Date of Birth:  [birth year]      BSA:          2.199 m Patient Age:    56 years        BP:           144/64 mmHg Patient Gender: M               HR:           101 bpm. Exam Location:  [facility] Procedure: 2D Echo, Cardiac Doppler and Color Doppler Indications:     Acute myocardial infarction, unspecified I21.9  History:         Patient has prior history of Echocardiogram examinations. Risk                  Factors:Diabetes and Hypertension.  Sonographer:     Neysa Bonito Roar Referring Phys:  [id number] Lyn Hollingshead PARASCHOS Diagnosing Phys: Yvonne Kendall MD IMPRESSIONS  1. Left ventricular ejection fraction, by estimation, is 30 to 35%. The left ventricle has moderately decreased function. The left ventricle demonstrates regional wall motion abnormalities (see scoring diagram/findings for description). The left ventricular internal cavity size was mildly to moderately dilated. There is mild left ventricular hypertrophy. Left ventricular diastolic parameters are indeterminate.  2. Pulmonary artery pressure is moderately elevated (RVSP 50 mmHg plus central venous/right  atrial pressure). Right ventricular systolic function is normal. The right ventricular size is normal.  3. Left atrial size was mildly dilated.  4. A small pericardial effusion is present. The pericardial effusion is posterior and lateral to the left ventricle. There is no evidence of cardiac tamponade.  5. The mitral valve is degenerative. Mild mitral valve regurgitation. No evidence of mitral stenosis.  6. The aortic valve was not well visualized. Aortic valve regurgitation is not visualized. No aortic stenosis is present. FINDINGS  Left Ventricle: Left ventricular ejection fraction, by estimation, is 30 to 35%. The left ventricle has moderately decreased function. The left ventricle demonstrates regional wall motion abnormalities. The left ventricular internal cavity size was mildly to moderately dilated. There is mild left ventricular hypertrophy. Left ventricular diastolic parameters are indeterminate.  LV Wall Scoring: The apical septal segment and apex are akinetic. The mid and distal anterior wall, mid anteroseptal segment, apical lateral segment, mid anterolateral segment, mid inferoseptal segment,  and apical inferior segment are hypokinetic. The inferior wall, posterior wall, basal anteroseptal segment, basal anterolateral segment, basal anterior segment, and basal inferoseptal segment are normal. Right Ventricle: Pulmonary artery pressure is moderately elevated (RVSP 50 mmHg plus central venous/right atrial pressure). The right ventricular size is normal. No increase in right ventricular wall thickness. Right ventricular systolic function is normal. Left Atrium: Left atrial size was mildly dilated. Right Atrium: Right atrial size was normal in size. Pericardium: A small pericardial effusion is present. The pericardial effusion is posterior and lateral to the left ventricle. There is no evidence of cardiac tamponade. Mitral Valve: The mitral valve is degenerative in appearance. Mild to moderate mitral  annular calcification. Mild mitral valve regurgitation. No evidence of mitral valve stenosis. MV peak gradient, 8.8 mmHg. The mean mitral valve gradient is 4.0 mmHg. Tricuspid Valve: The tricuspid valve is not well visualized. Tricuspid valve regurgitation is trivial. Aortic Valve: The aortic valve was not well visualized. Aortic valve regurgitation is not visualized. No aortic stenosis is present. Aortic valve mean gradient measures 8.0 mmHg. Aortic valve peak gradient measures 14.6 mmHg. Aortic valve area, by VTI measures 1.38 cm. Pulmonic Valve: The pulmonic valve was not well visualized. Pulmonic valve regurgitation is not visualized. No evidence of pulmonic stenosis. Aorta: The aortic root and ascending aorta are structurally normal, with no evidence of dilitation. Pulmonary Artery: The pulmonary artery is of normal size. Venous: IVC assessment for right atrial pressure unable to be performed due to mechanical ventilation. IAS/Shunts: The interatrial septum was not well visualized.  LEFT VENTRICLE PLAX 2D LVIDd:         6.10 cm      Diastology LVIDs:         5.10 cm      LV e' medial:    8.05 cm/s LV PW:         1.10 cm      LV E/e' medial:  15.5 LV IVS:        1.20 cm      LV e' lateral:   15.10 cm/s LVOT diam:     1.80 cm      LV E/e' lateral: 8.3 LV SV:         45 LV SV Index:   20 LVOT Area:     2.54 cm  LV Volumes (MOD) LV vol d, MOD A2C: 145.0 ml LV vol d, MOD A4C: 144.0 ml LV vol s, MOD A2C: 98.5 ml LV vol s, MOD A4C: 97.8 ml LV SV MOD A2C:     46.5 ml LV SV MOD A4C:     144.0 ml LV SV MOD BP:      49.5 ml RIGHT VENTRICLE RV Basal diam:  3.00 cm RV Mid diam:    2.70 cm RV S prime:     18.60 cm/s TAPSE (M-mode): 2.1 cm LEFT ATRIUM             Index        RIGHT ATRIUM           Index LA diam:        4.70 cm 2.14 cm/m   RA Area:     13.40 cm LA Vol (A2C):   91.1 ml 41.44 ml/m  RA Volume:   26.30 ml  11.96 ml/m LA Vol (A4C):   84.2 ml 38.30 ml/m LA Biplane Vol: 88.1 ml 40.07 ml/m  AORTIC VALVE  PULMONIC VALVE AV Area (Vmax):    1.59 cm      PV Vmax:        1.60 m/s AV Area (Vmean):   1.36 cm      PV Peak grad:   10.2 mmHg AV Area (VTI):     1.38 cm      RVOT Peak grad: 7 mmHg AV Vmax:           191.00 cm/s AV Vmean:          128.000 cm/s AV VTI:            0.325 m AV Peak Grad:      14.6 mmHg AV Mean Grad:      8.0 mmHg LVOT Vmax:         119.00 cm/s LVOT Vmean:        68.200 cm/s LVOT VTI:          0.176 m LVOT/AV VTI ratio: 0.54  AORTA Ao Root diam: 2.30 cm Ao Asc diam:  3.30 cm MITRAL VALVE                TRICUSPID VALVE MV Area (PHT): 5.27 cm     TR Peak grad:   50.4 mmHg MV Area VTI:   1.77 cm     TR Vmax:        355.00 cm/s MV Peak grad:  8.8 mmHg MV Mean grad:  4.0 mmHg     SHUNTS MV Vmax:       1.48 m/s     Systemic VTI:  0.18 m MV Vmean:      96.8 cm/s    Systemic Diam: 1.80 cm MV Decel Time: 144 msec MV E velocity: 125.00 cm/s MV A velocity: 89.20 cm/s MV E/A ratio:  1.40 MV A Prime:    12.0 cm/s Yvonne Kendall MD Electronically signed by Yvonne Kendall MD Signature Date/Time: 02/06/2023/12:11:10 PM    Final    EEG adult  Result Date: 02/20/2023 Charlsie Quest, MD     02/20/2023  6:00 PM Patient Name: James Moreno MRN: [id number] Epilepsy Attending: Charlsie Quest Referring Physician/Provider: Caryl Pina, MD Date: 02/20/2023 Duration: 34.49 mins  Patient history: 57 year old male status post cardiac arrest.  EEG evaluate for seizure.  Level of alertness: comatose  AEDs during EEG study: LEV  Technical aspects: This EEG study was done with scalp electrodes positioned according to the 10-20 International system of electrode placement. Electrical activity was reviewed with band pass filter of 1-70Hz , sensitivity of 7 uV/mm, display speed of 32mm/sec with a 60Hz  notched filter applied as appropriate. EEG data were recorded continuously and digitally stored.  Video monitoring was available and reviewed as appropriate.  Description: EEG showed continuous generalized  background suppression. Hyperventilation and photic stimulation were not performed.   EKG artifact was seen throughout the study  ABNORMALITY - Background suppression, generalized  IMPRESSION: This study was suggestive of profound diffuse encephalopathy.  No seizures were noted. EEG appears to be worsening compared to earlier EEG today.  Dr Otelia Limes was notified.  Charlsie Quest    EEG adult  Result Date: 02/20/2023 Charlsie Quest, MD     02/20/2023 11:59 AM Patient Name: James Moreno MRN: [id number] Epilepsy Attending: Charlsie Quest Referring Physician/Provider: Caryl Pina, MD Date: 02/20/2023 Duration: 34.49 mins Patient history: 57 year old male status post cardiac arrest.  EEG evaluate for seizure.  Level of alertness: comatose  AEDs during EEG study: LEV, versed, propofol  Technical  aspects: This EEG study was done with scalp electrodes positioned according to the 10-20 International system of electrode placement. Electrical activity was reviewed with band pass filter of 1-70Hz , sensitivity of 7 uV/mm, display speed of 28mm/sec with a 60Hz  notched filter applied as appropriate. EEG data were recorded continuously and digitally stored.  Video monitoring was available and reviewed as appropriate.  Description: EEG showed burst suppression with burst of 3-7hz  theta-delta slowing lasting 1-3 seconds alternating with 5-7 seconds of generalized background suppression. Hyperventilation and photic stimulation were not performed.    ABNORMALITY - Burst suppression, generalized  IMPRESSION: This study was suggestive of profound diffuse encephalopathy.  No seizures were noted.  Charlsie Quest   DG Abd Portable 1V  Result Date: 02/19/2023 CLINICAL DATA:  Feeding tube placement EXAM: PORTABLE ABDOMEN - 1 VIEW COMPARISON:  Abdominal x-ray 02/02/2023 FINDINGS: Enteric tube tip is in the proximal body of the stomach. There is a small amount of contrast in the stomach. Sidehole is now just below the  level of the gastroesophageal junction. IMPRESSION: Enteric tube tip is in the proximal body of the stomach. Electronically Signed   By: Darliss Cheney M.D.   On: 02/19/2023 20:14   EEG adult  Result Date: 02/19/2023 Charlsie Quest, MD     02/19/2023  3:57 PM Patient Name: James Moreno MRN: [id number] Epilepsy Attending: Charlsie Quest Referring Physician/Provider: Caryl Pina, MD Date: 02/19/2023 Duration: 28.20 mins  Patient history: 57 year old male status post cardiac arrest.  EEG evaluate for seizure.  Level of alertness: comatose  AEDs during EEG study: LEV, versed, propofol  Technical aspects: This EEG study was done with scalp electrodes positioned according to the 10-20 International system of electrode placement. Electrical activity was reviewed with band pass filter of 1-70Hz , sensitivity of 7 uV/mm, display speed of 12mm/sec with a 60Hz  notched filter applied as appropriate. EEG data were recorded continuously and digitally stored.  Video monitoring was available and reviewed as appropriate.  Description: EEG showed burst suppression with burst of 3-7hz  theta-delta slowing lasting 5-10 seconds alternating with 10-15 seconds of generalized background suppression. Hyperventilation and photic stimulation were not performed.    ABNORMALITY - Burst suppression, generalized  IMPRESSION: This study was suggestive of profound diffuse encephalopathy.  No seizures were noted.  Charlsie Quest   DG Chest Port 1 View  Result Date: 02/19/2023 CLINICAL DATA:  Acute respiratory failure, hypoxia EXAM: PORTABLE CHEST 1 VIEW COMPARISON:  02/11/2023 FINDINGS: Two frontal views of the chest demonstrate stable endotracheal tube, enteric catheter, and right internal jugular dialysis catheter. Left internal jugular catheter tip again projects over the brachiocephalic confluence. Cardiac silhouette is stable. There is persistent pulmonary vascular congestion, likely not significantly changed given differences  in patient positioning and technique. No effusion or pneumothorax. No acute bony abnormalities. IMPRESSION: 1. Support devices as above. 2. Pulmonary vascular congestion, with no significant change in volume status. Electronically Signed   By: Sharlet Salina M.D.   On: 02/19/2023 09:04   MR BRAIN WO CONTRAST  Result Date: 02/19/2023 CLINICAL DATA:  Follow-up examination for stroke. EXAM: MRI HEAD WITHOUT CONTRAST TECHNIQUE: Multiplanar, multiecho pulse sequences of the brain and surrounding structures were obtained without intravenous contrast. COMPARISON:  Prior CT from 02/08/2023. FINDINGS: Brain: Cerebral volume within normal limits. Patchy T2/FLAIR hyperintensity involving the periventricular and deep white matter of both cerebral hemispheres as well as the pons, consistent with chronic small vessel ischemic disease. Remote lacunar infarcts present at the left thalamus and pons.  Extensive diffusion signal abnormality seen involving the cortical gray matter of both cerebral hemispheres, slightly worse on the right. Symmetric involvement of the bilateral basal ganglia and thalami. Diffusion signal abnormality seen throughout the cerebellum. Associated T2/FLAIR signal abnormality throughout the areas affected. Findings consistent with widespread anoxic brain injury. No definite superimposed vascular infarct. Multiple scattered punctate foci of susceptibility artifact noted, consistent with small chronic micro hemorrhages, likely hypertensive in nature. Few small foci of susceptibility artifact noted within the left frontoparietal region. No visible acute hemorrhage seen at these locations on prior head CT. No mass lesion or midline shift. No hydrocephalus or extra-axial fluid collection. Pituitary gland and suprasellar region within normal limits. Vascular: Major intracranial vascular flow voids are maintained. Skull and upper cervical spine: Craniocervical junction within normal limits. Decreased T1 signal  intensity seen within the visualized bone marrow, nonspecific, but most commonly related to anemia, smoking, or obesity. No scalp soft tissue abnormality. Sinuses/Orbits: Prior ocular lens replacement on the right. Scattered mucosal thickening present throughout the paranasal sinuses. Trace bilateral mastoid effusions. Patient is intubated. Other: None. IMPRESSION: 1. Findings consistent with widespread anoxic brain injury as detailed above. 2. Underlying chronic microvascular ischemic disease with remote lacunar infarcts at the left thalamus and pons. 3. Few small foci of susceptibility artifact involving the left frontoparietal region. While no visible hemorrhage is seen or these locations on prior head CT, that exam was somewhat limited due to the presence of IV contrast material related to prior cardiac catheterization. Correlation with repeat noncontrast head CT suggested to evaluate for possible trace subarachnoid hemorrhage as warranted. Electronically Signed   By: Rise Mu M.D.   On: 02/19/2023 06:54   CT HEAD WO CONTRAST ( )  Result Date: 02/02/2023 CLINICAL DATA:  Mental status change, unknown cause. EXAM: CT HEAD WITHOUT CONTRAST TECHNIQUE: Contiguous axial images were obtained from the base of the skull through the vertex without intravenous contrast. RADIATION DOSE REDUCTION: This exam was performed according to the departmental dose-optimization program which includes automated exposure control, adjustment of the mA and/or kV according to patient size and/or use of iterative reconstruction technique. COMPARISON:  Head CT February 17, 2019. FINDINGS: Brain: Although no intravenous contrast was administered to obtain this study, the images are enhance by contrast administered during cardiac catheterization performed earlier today. This evaluation for possible subarachnoid hemorrhage. Limits hypodense focus is seen on the left side of the pons, concerning for infarct versus artifact. A  hypodense focus is also seen in the left thalamus. No large acute territorial infarct identified. No hydrocephalus, extra-axial collection or mass lesion/mass effect. Vascular: Calcified plaques in the bilateral carotid siphons. Skull: Normal. Negative for fracture or focal lesion. Sinuses/Orbits: Fluid within the paranasal sinuses and within the nasopharynx and oropharynx related to presence of endotracheal tube. The orbits are maintained. Other: Soft tissue swelling on the right side of the face may be posttraumatic. IMPRESSION: 1. Limited study due to presence of contrast from cardiac catheterization performed earlier today. 2. Hypodense focus in the left side of the pons and left thalamus, concerning for infarct versus artifact. Correlation with MRI of the brain recommended. 3. Soft tissue swelling on the right side of the face may be posttraumatic. Electronically Signed   By: Baldemar Lenis M.D.   On: 01/29/2023 16:28   EEG adult  Result Date: 01/28/2023 Charlsie Quest, MD     02/09/2023  1:40 PM Patient Name: James Moreno MRN: [id number] Epilepsy Attending: Charlsie Quest Referring Physician/Provider: Elvina Sidle,  Braulio Conte, NP Date: 02/15/2023 Duration: 25.50 mins Patient history: 57 year old male status post cardiac arrest.  EEG evaluate for seizure. Level of alertness: comatose AEDs during EEG study: LEV, versed Technical aspects: This EEG study was done with scalp electrodes positioned according to the 10-20 International system of electrode placement. Electrical activity was reviewed with band pass filter of 1-70Hz , sensitivity of 7 uV/mm, display speed of 38mm/sec with a 60Hz  notched filter applied as appropriate. EEG data were recorded continuously and digitally stored.  Video monitoring was available and reviewed as appropriate. Description: Patient was noted to have episodes of brief sudden eye opening with whole body jerking every few seconds.  Concomitant EEG showed generalized  polyspikes consistent with myoclonic seizures.  In between seizures EEG showed generalized background suppression. Hyperventilation and photic stimulation were not performed.   ABNORMALITY -Myoclonic seizure, generalized -Background suppression, generalized IMPRESSION: Patient was noted to have myoclonic seizures every few seconds.  Additionally there was evidence of severe to profound diffuse encephalopathy.  In the setting of cardiac arrest, this EEG pattern is concerning for anoxic/hypoxic brain injury. Harlon Ditty, NP was notified. Charlsie Quest   DG Abd 1 View  Result Date: 02/14/2023 CLINICAL DATA:  57 year old male intubated.  Enteric tube placement. EXAM: ABDOMEN - 1 VIEW COMPARISON:  Portable chest reported separately at the same time. CT Abdomen and Pelvis 10/31/2010. FINDINGS: Portable AP supine view at 1140 hours. Enteric tube terminates in the proximal stomach, side hole is at the level of the GEJ. Paucity of bowel gas in the upper abdomen. Stable visible lower chest as reported separately. IMPRESSION: Enteric tube placed into the stomach but side hole is at the level of the GEJ. Advance 6 cm to ensure side hole placement within the stomach. Electronically Signed   By: Odessa Fleming M.D.   On: 02/14/2023 11:54   DG Chest Port 1 View  Result Date: 01/29/2023 CLINICAL DATA:  57 year old male intubated. EXAM: PORTABLE CHEST 1 VIEW COMPARISON:  Chest radiographs 06/21/2013, chest CT 08/23/2021 and earlier. FINDINGS: Portable AP supine view at 1138 hours. Endotracheal tube tip at the level the clavicles. Enteric tube courses to the abdomen, tip not included. Right IJ central line, dual lumen dialysis type with catheter tips extending to the lower SVC level. Superimposed left IJ approach single lumen vascular catheter, tip projects at the confluence of the innominate veins. Mediastinal contours remain within normal limits. Coarse bilateral perihilar and interstitial opacity, slight upper lobe  predominance. No pneumothorax or pleural effusion identified on this supine view. Chronic right clavicle fracture. IMPRESSION: 1. ETT tip at the level the clavicles. Enteric tube courses to the abdomen, tip not included. Right IJ dialysis type catheter tips at the lower SVC level. Left IJ single lumen catheter terminates at the confluence of the innominate veins. 2. No pneumothorax on this supine view. Coarse and confluent bilateral perihilar and interstitial opacity is nonspecific. Top differential considerations include acute pulmonary edema, bilateral pneumonia. Electronically Signed   By: Odessa Fleming M.D.   On: 01/25/2023 11:53   CARDIAC CATHETERIZATION  Result Date: 02/03/2023   2nd Mrg lesion is 99% stenosed.   1st Mrg lesion is 75% stenosed.   Mid Cx to Dist Cx lesion is 90% stenosed.   Ost LAD to Prox LAD lesion is 99% stenosed.   Mid LAD lesion is 50% stenosed.   Prox RCA lesion is 30% stenosed.   Mid RCA lesion is 20% stenosed.   A drug-eluting stent was successfully placed using a  STENT ONYX FRONTIER 3.0X08.   Post intervention, there is a 0% residual stenosis.   There is severe left ventricular systolic dysfunction.   LV end diastolic pressure is mildly elevated.   The left ventricular ejection fraction is 25-35% by visual estimate. 1.  Cardiac arrest 2.  Anterior STEMI 3.  Successful primary PCI with 3.0 x 8 mm Onyx frontier DES ostial LAD 4.  Residual 99% stenosis OM 2 which appears chronic and collateralized 5.  Severe dilated cardiomyopathy with estimated LV ejection fraction 25% 6.  Guarded prognosis Recommendations 1.  Continue IV cangrelor until patient can take oral platelet inhibitor 2.  Good medical management after extubation   PERIPHERAL VASCULAR CATHETERIZATION  Result Date: 02/04/2023 See surgical note for result.   Microbiology Recent Results (from the past 240 hour(s))  Urine Culture     Status: None   Collection Time: 02/19/2023 10:51 AM   Specimen: Urine, Random  Result Value  Ref Range Status   Specimen Description   Final    URINE, RANDOM Performed at [facility], [address], [place], [place] [address]    Special Requests   Final    NONE Reflexed from [id number] Performed at [facility], [address], [place], [place] [address]    Culture   Final    NO GROWTH Performed at [facility], [address], [place], [place] [address]    Report Status 02/19/2023 FINAL  Final  MRSA Next Gen by PCR, Nasal     Status: None   Collection Time: 02/10/2023 10:59 AM   Specimen: Nasal Mucosa; Nasal Swab  Result Value Ref Range Status   MRSA by PCR Next Gen NOT DETECTED NOT DETECTED Final    Comment: (NOTE) The GeneXpert MRSA Assay (FDA approved for NASAL specimens only), is one component of a comprehensive MRSA colonization surveillance program. It is not intended to diagnose MRSA infection nor to guide or monitor treatment for MRSA infections. Test performance is not FDA approved in patients less than 19 years old. Performed at [facility], [address] [place], [place] [address]   Culture, blood (Routine X 2) w Reflex to ID Panel     Status: None (Preliminary result)   Collection Time: 02/05/2023  1:12 PM   Specimen: BLOOD RIGHT HAND  Result Value Ref Range Status   Specimen Description BLOOD RIGHT HAND  Final   Special Requests   Final    Blood Culture results may not be optimal due to an excessive volume of blood received in culture bottles   Culture   Final    NO GROWTH 3 DAYS Performed at [facility], [address] [place], [place] [address]    Report Status PENDING  Incomplete  Culture, blood (Routine X 2) w Reflex to ID Panel     Status: None (Preliminary result)   Collection Time: 02/02/2023  1:22 PM   Specimen: A-Line; Blood  Result Value Ref Range Status   Specimen Description A-LINE  Final   Special Requests BCAV  Final   Culture   Final    NO GROWTH 3 DAYS Performed at [facility],  [address], [place], [place] [address]    Report Status PENDING  Incomplete    Lab Basic Metabolic Panel: Recent Labs  Lab 02/04/2023 0645 02/10/2023 0810 02/23/2023 2244 02/19/23 0217 02/19/23 0358 02/20/23 0358 02/14/2023 0424  NA 128*   < > 133* 131* 131* 132* 135  K 3.4*   < >  3.6 4.1 4.3 4.0 4.1  CL 93*   < > 97* 96* 96* 97* 97*  CO2 23   < > 26 23 23 24 26   GLUCOSE 621*   < > 128* 150* 161* 199* 270*  BUN 39*   < > 32* 35* 36* 47* 42*  CREATININE 4.36*   < > 2.98* 3.52* 3.63* 4.79* 3.99*  CALCIUM 9.7   < > 8.0* 8.0* 8.1* 7.9* 8.1*  MG 2.1  --   --   --  1.6* 2.0 2.1  PHOS  --   --   --   --  5.7* 6.4* 5.9*   < > = values in this interval not displayed.   Liver Function Tests: Recent Labs  Lab 02/15/2023 1152 02/19/23 0358 02/20/23 0358 02/02/2023 0424  AST 89* 35  --   --   ALT 97* 64*  --   --   ALKPHOS 124 94  --   --   BILITOT 0.6 0.6  --   --   PROT 5.0* 4.8*  --   --   ALBUMIN 2.8* 2.6*  2.7* 2.3* 2.4*   No results for input(s): "LIPASE", "AMYLASE" in the last 168 hours. Recent Labs  Lab 02/20/23 1520  AMMONIA 15   CBC: Recent Labs  Lab 01/28/2023 0645 02/09/2023 0810 02/14/2023 1152 02/19/23 0358 02/20/23 0358 02/01/2023 0424  WBC 11.6*  --  19.8* 19.6* 13.1* 9.8  HGB 9.3* 10.9* 10.1* 9.2* 8.0* 8.1*  HCT 31.1* 32.0* 31.4* 27.6* 24.4* 25.6*  MCV 101.3*  --  94.3 91.7 93.5 96.2  PLT 181  --  224 177 152 154   Cardiac Enzymes: No results for input(s): "CKTOTAL", "CKMB", "CKMBINDEX", "TROPONINI" in the last 168 hours. Sepsis Labs: Recent Labs  Lab 02/14/2023 0645 01/28/2023 1152 01/25/2023 1310 02/19/23 0358 02/20/23 0358 02/03/2023 0424  WBC 11.6* 19.8*  --  19.6* 13.1* 9.8  LATICACIDVEN 5.3* 1.0 1.5  --   --   --     Procedures/Operations  02/07/2023: ETT placement 02/17/2023: EEG performed 02/19/2023: CVC placement 02/03/2023: Arterial Line placement 02/19/23: EEG placement 02/20/23: EEG placement   Neima Lacross L Rust-Chester 01/29/2023, 8:22  PM  Cheryll Cockayne Rust-Chester, AGACNP-BC Acute Care Nurse Practitioner [facility]   [phone] / [phone] Please see Amion for details.

## 2023-02-24 NOTE — Progress Notes (Signed)
Extubated patient to comfort care per MD order.

## 2023-02-24 NOTE — Progress Notes (Signed)
[facility], [place] 01/30/2023  Subjective:   LOS: 3  Patient known to our practice from outpatient follow-up for CKD.  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  Patient arrived to emergency room via EMS in cardiac arrest.  He had called out for shortness of breath, confusion and pallor. EMS notes indicate low oxygen saturation upon their arrival.  His last dialysis was Saturday.  EKG obtained by EMS showed anterior lateral STEMI.  He lost pulse and became asystolic.  Resuscitative measures were provided for about 15 minutes.  Patient was intubated in the emergency room. Nephrology consult has been requested for evaluation as patient's potassium level was elevated at 6.2 this morning. When seen, patient had myoclonic jerks concerning for anoxic brain injury.  He was evaluated by neurologist.  Patient remains critically ill, ventilator dependent Significant neurological concern of anoxic brain injury. No pressors.  Objective:  Vital signs in last 24 hours:  Temp:  [98.7 F (37.1 C)-101.4 F (38.6 C)] 100.7 F (38.2 C) (11/28 1136) Pulse Rate:  [77-100] 96 (11/28 0701) Resp:  [0-20] 20 (11/28 0701) BP: (121-193)/(49-75) 160/53 (11/28 1150) SpO2:  [92 %-95 %] 93 % (11/28 5643) Arterial Line BP: (135-184)/(42-55) 165/50 (11/28 0701) FiO2 (%):  [30 %-40 %] 40 % (11/28 0822) Weight:  [100.1 kg-102 kg] 100.1 kg (11/27 1550)  Weight change: -0.1 kg Filed Weights   02/20/23 0500 02/20/23 1412 02/20/23 1550  Weight: 102.1 kg 102 kg 100.1 kg    Intake/Output:    Intake/Output Summary (Last 24 hours) at 01/27/2023 1152 Last data filed at 02/22/2023 0600 Gross per 24 hour  Intake 1521.48 ml  Output 2595 ml  Net -1073.52 ml     Physical  Exam: General: Critically ill-appearing  HEENT ET tube in place  Pulm/lungs Ventilator assisted.    CVS/Heart Tachycardic, irregular  Abdomen:  Soft, nontender, nondistended  Extremities: Trace edema  Neurologic: Sedated  Skin: Warm  Access: Right IJ tunneled dialysis catheter       Basic Metabolic Panel:  Recent Labs  Lab 01/26/2023 0645 02/05/2023 0810 02/22/2023 2244 02/19/23 0217 02/19/23 0358 02/20/23 0358 01/26/2023 0424  NA 128*   < > 133* 131* 131* 132* 135  K 3.4*   < > 3.6 4.1 4.3 4.0 4.1  CL 93*   < > 97* 96* 96* 97* 97*  CO2 23   < > 26 23 23 24 26   GLUCOSE 621*   < > 128* 150* 161* 199* 270*  BUN 39*   < > 32* 35* 36* 47* 42*  CREATININE 4.36*   < > 2.98* 3.52* 3.63* 4.79* 3.99*  CALCIUM 9.7   < > 8.0* 8.0* 8.1* 7.9* 8.1*  MG 2.1  --   --   --  1.6* 2.0 2.1  PHOS  --   --   --   --  5.7* 6.4* 5.9*   < > = values in this interval not displayed.     CBC: Recent Labs  Lab 02/09/2023 0645 02/12/2023 0810 02/10/2023 1152 02/19/23 0358 02/20/23 0358 02/03/2023 0424  WBC 11.6*  --  19.8* 19.6* 13.1* 9.8  HGB 9.3* 10.9* 10.1* 9.2* 8.0* 8.1*  HCT 31.1* 32.0* 31.4* 27.6* 24.4* 25.6*  MCV 101.3*  --  94.3 91.7 93.5 96.2  PLT 181  --  224 177 152 154      Lab Results  Component Value Date   HEPBSAG NON REACTIVE 01/30/2023      Microbiology:  Recent Results (from the past 240 hour(s))  Urine Culture     Status: None   Collection Time: 02/19/2023 10:51 AM   Specimen: Urine, Random  Result Value Ref Range Status   Specimen Description   Final    URINE, RANDOM Performed at [facility], [address], [place], [place] [address]    Special Requests   Final    NONE Reflexed from [phone] Performed at [facility], [address], [place], [place] [address]    Culture   Final    NO GROWTH Performed at [facility], [address] [place], [place] [address]    Report Status 02/19/2023 FINAL  Final  MRSA Next Gen by PCR, Nasal      Status: None   Collection Time: 02/03/2023 10:59 AM   Specimen: Nasal Mucosa; Nasal Swab  Result Value Ref Range Status   MRSA by PCR Next Gen NOT DETECTED NOT DETECTED Final    Comment: (NOTE) The GeneXpert MRSA Assay (FDA approved for NASAL specimens only), is one component of a comprehensive MRSA colonization surveillance program. It is not intended to diagnose MRSA infection nor to guide or monitor treatment for MRSA infections. Test performance is not FDA approved in patients less than 80 years old. Performed at [facility], [address] [place], [place] [address]   Culture, blood (Routine X 2) w Reflex to ID Panel     Status: None (Preliminary result)   Collection Time: 02/15/2023  1:12 PM   Specimen: BLOOD RIGHT HAND  Result Value Ref Range Status   Specimen Description BLOOD RIGHT HAND  Final   Special Requests   Final    Blood Culture results may not be optimal due to an excessive volume of blood received in culture bottles   Culture   Final    NO GROWTH 3 DAYS Performed at [facility], [address] [place], [place] [address]    Report Status PENDING  Incomplete  Culture, blood (Routine X 2) w Reflex to ID Panel     Status: None (Preliminary result)   Collection Time: 02/22/2023  1:22 PM   Specimen: A-Line; Blood  Result Value Ref Range Status   Specimen Description A-LINE  Final   Special Requests BCAV  Final   Culture   Final    NO GROWTH 3 DAYS Performed at [facility], [address] [place], [place] [address]    Report Status PENDING  Incomplete    Coagulation Studies: No results for input(s): "LABPROT", "INR" in the last 72 hours.  Urinalysis: No results for input(s): "COLORURINE", "LABSPEC", "PHURINE", "GLUCOSEU", "HGBUR", "BILIRUBINUR", "KETONESUR", "PROTEINUR", "UROBILINOGEN", "NITRITE", "LEUKOCYTESUR" in the last 72 hours.  Invalid input(s): "APPERANCEUR"     Imaging: EEG adult  Result Date: 02/20/2023 Charlsie Quest, MD     02/20/2023  6:00 PM Patient Name: VISHAAN Moreno MRN: [id number] Epilepsy Attending: Charlsie Quest Referring Physician/Provider: Caryl Pina, MD Date: 02/20/2023 Duration: 34.49 mins  Patient history: 57 year old male status post cardiac arrest.  EEG evaluate for seizure.  Level of alertness: comatose  AEDs during EEG study: LEV  Technical aspects: This EEG study was done with scalp electrodes positioned according to the 10-20 International system of electrode placement. Electrical activity was reviewed with band pass filter of 1-70Hz , sensitivity  of 7 uV/mm, display speed of 46mm/sec with a 60Hz  notched filter applied as appropriate. EEG data were recorded continuously and digitally stored.  Video monitoring was available and reviewed as appropriate.  Description: EEG showed continuous generalized background suppression. Hyperventilation and photic stimulation were not performed.   EKG artifact was seen throughout the study  ABNORMALITY - Background suppression, generalized  IMPRESSION: This study was suggestive of profound diffuse encephalopathy.  No seizures were noted. EEG appears to be worsening compared to earlier EEG today.  Dr Otelia Limes was notified.  Charlsie Quest    EEG adult  Result Date: 02/20/2023 Charlsie Quest, MD     02/20/2023 11:59 AM Patient Name: AUTRY ZAREK MRN: [id number] Epilepsy Attending: Charlsie Quest Referring Physician/Provider: Caryl Pina, MD Date: 02/20/2023 Duration: 34.49 mins Patient history: 57 year old male status post cardiac arrest.  EEG evaluate for seizure.  Level of alertness: comatose  AEDs during EEG study: LEV, versed, propofol  Technical aspects: This EEG study was done with scalp electrodes positioned according to the 10-20 International system of electrode placement. Electrical activity was reviewed with band pass filter of 1-70Hz , sensitivity of 7 uV/mm, display speed of 39mm/sec with a 60Hz  notched filter applied as  appropriate. EEG data were recorded continuously and digitally stored.  Video monitoring was available and reviewed as appropriate.  Description: EEG showed burst suppression with burst of 3-7hz  theta-delta slowing lasting 1-3 seconds alternating with 5-7 seconds of generalized background suppression. Hyperventilation and photic stimulation were not performed.    ABNORMALITY - Burst suppression, generalized  IMPRESSION: This study was suggestive of profound diffuse encephalopathy.  No seizures were noted.  Charlsie Quest   DG Abd Portable 1V  Result Date: 02/19/2023 CLINICAL DATA:  Feeding tube placement EXAM: PORTABLE ABDOMEN - 1 VIEW COMPARISON:  Abdominal x-ray 02/17/2023 FINDINGS: Enteric tube tip is in the proximal body of the stomach. There is a small amount of contrast in the stomach. Sidehole is now just below the level of the gastroesophageal junction. IMPRESSION: Enteric tube tip is in the proximal body of the stomach. Electronically Signed   By: Darliss Cheney M.D.   On: 02/19/2023 20:14   EEG adult  Result Date: 02/19/2023 Charlsie Quest, MD     02/19/2023  3:57 PM Patient Name: CALVYN PORTERA MRN: [id number] Epilepsy Attending: Charlsie Quest Referring Physician/Provider: Caryl Pina, MD Date: 02/19/2023 Duration: 28.20 mins  Patient history: 57 year old male status post cardiac arrest.  EEG evaluate for seizure.  Level of alertness: comatose  AEDs during EEG study: LEV, versed, propofol  Technical aspects: This EEG study was done with scalp electrodes positioned according to the 10-20 International system of electrode placement. Electrical activity was reviewed with band pass filter of 1-70Hz , sensitivity of 7 uV/mm, display speed of 60mm/sec with a 60Hz  notched filter applied as appropriate. EEG data were recorded continuously and digitally stored.  Video monitoring was available and reviewed as appropriate.  Description: EEG showed burst suppression with burst of 3-7hz  theta-delta  slowing lasting 5-10 seconds alternating with 10-15 seconds of generalized background suppression. Hyperventilation and photic stimulation were not performed.    ABNORMALITY - Burst suppression, generalized  IMPRESSION: This study was suggestive of profound diffuse encephalopathy.  No seizures were noted.  Priyanka Annabelle Harman     Medications:    feeding supplement (VITAL 1.5 CAL) 55 mL/hr at 01/25/2023 0600   fentaNYL infusion INTRAVENOUS 200 mcg/hr (02/20/2023 1021)   levETIRAcetam Stopped (02/20/23 1147)   levETIRAcetam 500  mg (02/20/23 1736)   midazolam Stopped (02/20/23 1050)   norepinephrine (LEVOPHED) Adult infusion Stopped (02/20/23 1939)   propofol (DIPRIVAN) infusion Stopped (02/20/23 1050)    aspirin  81 mg Per Tube Daily   Chlorhexidine Gluconate Cloth  6 each Topical Q0600   docusate  100 mg Per Tube BID   feeding supplement (PROSource TF20)  60 mL Per Tube BID   free water  30 mL Per Tube Q4H   heparin injection (subcutaneous)  5,000 Units Subcutaneous Q8H   hydrALAZINE  25 mg Per Tube Q8H   insulin aspart  0-9 Units Subcutaneous Q4H   multivitamin  1 tablet Per Tube QHS   mouth rinse  15 mL Mouth Rinse Q2H   pantoprazole (PROTONIX) IV  40 mg Intravenous Daily   polyethylene glycol  17 g Per Tube Daily   sodium chloride flush  3 mL Intravenous Q12H   ticagrelor  90 mg Per Tube BID   acetaminophen, alteplase, fentaNYL, heparin, heparin, hydrALAZINE, levETIRAcetam, midazolam, mouth rinse, sodium chloride flush  Assessment/ Plan:  57 y.o. male with  history of end-stage renal disease ,peritoneal dialysis, history of hepatitis B infection, history of positive PPD treated with INH/rifampin in the past, renal transplantation April 2016 which failed recently-hemodialysis restarted, current smoker was admitted on 02/22/2023 for  Principal Problem:   Acute ST elevation myocardial infarction (STEMI) involving left anterior descending (LAD) coronary artery (HCC) Active Problems:    Cardiac arrest (HCC)   Acute respiratory failure with hypoxia (HCC)   ST elevation myocardial infarction (STEMI) (HCC)   Anoxic brain injury (HCC)   Dilated cardiomyopathy (HCC)  Acute ST elevation myocardial infarction (STEMI) involving left anterior descending (LAD) coronary artery (HCC) [I21.02]  #. ESRD with hyperkalemia Next routine hemodialysis scheduled for Friday Urine tox screen positive for cocaine. Concerns about anoxic brain injury as reported on CT and by EEG evidence. Neurology team following closely.  #. Anemia of CKD  Lab Results  Component Value Date   HGB 8.1 (L) 02/10/2023   Low dose EPO with HD if hemoglobin is trending lower.  #. Secondary hyperparathyroidism of renal origin N 25.81   No results found for: "PTH" Lab Results  Component Value Date   PHOS 5.9 (H) 02/15/2023   Monitor calcium and phos level during this admission  #Acute respiratory failure Ventilator dependent at present FiO2 30%, PEEP 5. Currently getting tube feeds at 35 cc/h.   #. Diabetes type 2 with CKD Hemoglobin A1C (no units)  Date Value  08/08/2022 7.5   Hgb A1c MFr Bld (%)  Date Value  02/03/2023 6.7 (H)  Currently managed with NovoLog.  #STEMI Complicated by cardiac arrest, 15 minutes of ACLS, acute respiratory failure requiring ventilator support, possible anoxic brain injury. -Patient is status post emergent cardiac catheterization showing severe ostial LAD stenosis and subtotal occlusion of the mid left circumflex, EF of 20 to 25%.  Difficult PCI to ostial LAD.    LOS: 3 Shanigua Gibb Thedore Mins 11/28/202411:52 AM  [facility] [place], [place] [phone]

## 2023-02-24 NOTE — Progress Notes (Signed)
Daily Progress Note   Patient Name: James Moreno       Date: 02/16/2023 DOB: Nov 08, 1965  Age: 57 y.o. MRN#: [id number] Attending Physician: Janann Colonel, MD Primary Care Physician: [name], MD Admit Date: 02/17/2023  Reason for Consultation/Follow-up: Establishing goals of care  Subjective: Unresponsive  Length of Stay: 3  Current Medications: Scheduled Meds:   antiseptic oral rinse  15 mL Topical TID   hydrALAZINE  25 mg Per Tube Q8H   HYDROmorphone  1 mg Intravenous Q15 min   mouth rinse  15 mL Mouth Rinse Q2H   sodium chloride flush  3 mL Intravenous Q12H    Continuous Infusions:  HYDROmorphone     levETIRAcetam 1,000 mg (01/30/2023 1157)   levETIRAcetam 500 mg (02/20/23 1736)   midazolam 6 mg/hr (02/06/2023 1624)    PRN Meds: acetaminophen, alteplase, glycopyrrolate **OR** glycopyrrolate **OR** glycopyrrolate, hydrALAZINE, levETIRAcetam, midazolam, mouth rinse, polyvinyl alcohol, sodium chloride flush  Physical Exam Constitutional:      Appearance: He is ill-appearing.     Comments: unresponsive  Pulmonary:     Comments: Remains on vent Skin:    General: Skin is warm and dry.             Vital Signs: BP (!) 214/65   Pulse (!) 105   Temp (!) 100.7 F (38.2 C) (Axillary)   Resp 20   Ht 5' 10.98" (1.803 m)   Wt 100.1 kg   SpO2 93%   BMI 30.79 kg/m  SpO2: SpO2: 93 % O2 Device: O2 Device: Ventilator O2 Flow Rate: O2 Flow Rate (L/min): 96 L/min  Intake/output summary:  Intake/Output Summary (Last 24 hours) at 02/16/2023 1718 Last data filed at 01/30/2023 0600 Gross per 24 hour  Intake 1521.48 ml  Output 595 ml  Net 926.48 ml   LBM: Last BM Date :  (PTA) Baseline Weight: Weight: 99.3 kg Most recent weight: Weight: 100.1 kg  Patient Active  Problem List   Diagnosis Date Noted   Acute respiratory failure with hypoxia (HCC) 02/20/2023   ST elevation myocardial infarction (STEMI) (HCC) 02/20/2023   Anoxic brain injury (HCC) 02/20/2023   Dilated cardiomyopathy (HCC) 02/20/2023   Cardiac arrest (HCC) 02/19/2023   Acute ST elevation myocardial infarction (STEMI) involving left anterior descending (LAD) coronary artery (HCC) 02/22/2023   Coronary artery disease of native artery of native heart with stable angina pectoris (HCC) 06/21/2021   Chronic kidney disease, stage 3b (HCC) 06/21/2021   Chronic pain following surgery or procedure 11/15/2020   Proliferative retinopathy of left eye due to diabetes mellitus (HCC) 01/28/2019   Esophageal dysphagia    Benign essential hypertension 12/11/2018   Secondary hyperparathyroidism of renal origin (HCC) 12/11/2018   Severe tobacco use disorder 08/01/2018   Atherosclerosis of artery of extremity with ulceration (HCC) 07/23/2018   Gastroesophageal reflux disease 05/26/2018   PAD (peripheral artery disease) (HCC) 10/30/2017   Chronic ulcer of heel, right, with unspecified severity (HCC) 10/16/2017   Hepatitis B core antibody positive 07/03/2017   Hypertriglyceridemia 07/03/2017   Chronic, continuous use of opioids 03/28/2016   Anxiety 03/28/2016   Pain in surgical scar 11/07/2015   Bulging eyes 01/24/2015   Leg mass 01/24/2015  Renal transplant, status post 01/24/2015   Carotid arterial disease (HCC) 12/27/2014   Compulsive tobacco user syndrome 12/27/2014   Abnormal EKG 04/06/2013   Erectile dysfunction 11/29/2012   Obesity 11/28/2012   Type 2 diabetes mellitus with diabetic nephropathy (HCC) 11/28/2012   Hypertension 12/21/2011   Hyperlipidemia 12/21/2011   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 10/30/2011   Obstructive apnea 01/09/2011   History of other malignant neoplasm of skin 07/16/2006   Glaucoma 01/13/2006   Episodic paroxysmal anxiety disorder 03/26/1998    Palliative  Care Assessment & Plan   HPI: 57 y.o. male  with past medical history of  admitted hypertension, diabetes, end stage renal disease status post renal transplant currently on hemodialysis on 02/08/2023 with cardiac arrest d/t STEMI. Positive for cocaine. Cath revealed revealed 99% stenosis ostial LAD, and chronically appearing 99% stenosis OM 2 with bridging collaterals. The patient underwent primary PCI receiving 3.0 x 8 mm Onyx frontier drug-eluting stent ostial LAD. Left ventriculography revealed fairly reduced left ventricular function with estimated LV ejection fraction of 25%. Neuro exam and MRI concerning for anoxic injury. PMT consulted to discuss GOC.   Assessment: Follow up today with family multiple times throughout the day.  Family (specifically NOK daughter Berline Lopes) shares after having time to process situation they do not want to continue any longer, they feel continuing would be against patient's wishes. They feel he would like to be freed from ventilator and allowed a natural death. We review no change in neuro exam after 24 hours off of propofol and versed. We review EEG from last night worsening compared to earlier.   Multiple conversations with different family members throughout the day to explain situation.   Spoke with neurology and critical care physicians who agree with plan.   Later in day family share plan to extubate at 8 pm. They understand anticipated death following extubation. We discuss use of medications to promote comfort. They agree. Will place orders now to prepare for extubation at 8 pm.   Recommendations/Plan: Family ready for extubation at 8 pm Dilaudid infusion in preparation for terminal extubation Maintain versed infusion as without it patient is tachypneic Comfort measures only  Goals of Care and Additional Recommendations: Limitations on Scope of Treatment: Full Comfort Care  Code Status: DNR  Prognosis:  Hours - Days  Discharge  Planning: Anticipated Hospital Death  Care plan was discussed with daughter Berline Lopes, multiple other family members, critical care physician, neurologist, ICU RN, respiratory therapist  Thank you for allowing the Palliative Medicine Team to assist in the care of this patient.   Total Time 90 minutes Prolonged Time Billed  yes   Time spent includes: Detailed review of medical records (labs, imaging, vital signs), medically appropriate exam, discussion with treatment team, counseling and educating patient, family and/or staff, documenting clinical information, medication management and coordination of care.     *Please note that this is a verbal dictation therefore any spelling or grammatical errors are due to the "[employer]" system interpretation.  Gerlean Ren, DNP, [facility] Palliative Medicine Team Team Phone # [phone]  Pager [phone]

## 2023-02-24 NOTE — Progress Notes (Signed)
  Echocardiogram 2D Echocardiogram has been performed.  James Moreno 01/28/2023, 8:37 AM

## 2023-02-24 NOTE — Progress Notes (Signed)
Visited pt and family at bedside. Pt was extubated-and passed peacefully surrounded by family. Offered compassionate presence and listening ear. No needs expressed at this time.

## 2023-02-24 NOTE — Progress Notes (Signed)
NAME:  James Moreno, MRN:  [id number], DOB:  03/26/1966, LOS: 3 ADMISSION DATE:  02/06/2023, CONSULTATION DATE:  01/28/2023 REFERRING MD:  Dr. Darrold Junker, CHIEF COMPLAINT:  STEMI, Out-of-Hospital Cardiac Arrest   Brief Pt Description / Synopsis:  57 y.o. male with PMHx significant for ESRD on HD admitted with Out-of-Hospital Cardiac Arrest (estimated total time of ACLS about 28 minutes) due to STEMI and cocaine intoxication.  Underwent emergent Cardiac Cath with drug eluting stent to ostial LAD.  Now exhibiting Myoclonus with concern for anoxic brain injury.  History of Present Illness:  James Moreno is a 57 y.o. male with past medical history significant for hypertension, diabetes, end stage renal disease status post renal transplant currently on hemodialysis who presented to [facility] ED with EMS in cardiac arrest.  EMS reports they were called out for shortness of breath.  They report that he seemed confused, gray in appearance on their arrival.  Patient sats were in the 70s on room air.  His spouse reports he did have dialysis on Saturday.  EMS obtained an EKG that showed anterior lateral ST elevation MI.  Code STEMI activated by EMS at 5:59 AM. He received full dose aspirin and 1 nitroglycerin to EMS.  Patient then became bradycardic in the 30s.  He then lost pulses and was in asystole.  CPR started at 6:09 AM.  EMS reports they continued resuscitative measures for about 15 minutes.  He received 2 rounds of epinephrine with EMS.  Patient being ventilated by BVM on arrival and receiving active chest compressions.  He was intubated by ED Provider.  ROSC obtain briefly, then he again became bradycardic, given 1 mg of Atropine, but progressed to PEA requiring another round of ACLS and CPR again.  Cardiology at bedside upon patient's arrival with plans to take him for emergent cardiac cath.  ED Course: Initial Vital Signs: RR 16 (via BVM), Pulse 87, BP 104/39, SpO2 95% via 100% FiO2 via BVM Significant  Labs: Sodium 128, potassium 3.4, chloride 93, glucose 621, BUN 39, creatinine 4.36, bicarb 23, anion gap 12, BNP 1572, high-sensitivity troponin 382, lactic acid 5.3, WBC 11.6, hemoglobin 10.3, hematocrit 31.1, beta hydroxybutyric acid 0.39 ABG postintubation: pH 7.34/pCO2 46/pO2 358/bicarb 25.6 Urine drug screen positive for cocaine Imaging Chest X-ray>>IMPRESSION: 1. ETT tip at the level the clavicles. Enteric tube courses to the abdomen, tip not included. Right IJ dialysis type catheter tips at the lower SVC level. Left IJ single lumen catheter terminates at the confluence of the innominate veins. 2. No pneumothorax on this supine view. Coarse and confluent bilateral perihilar and interstitial opacity is nonspecific. Top differential considerations include acute pulmonary edema, bilateral pneumonia. Medications Administered: Epinephrine and Levophed infusions  Cardiology evaluated the patient at bedside, and he was taken for emergent cardiac catheterization. Cath revealed revealed 99% stenosis ostial LAD, and chronically appearing 99% stenosis OM 2 with bridging collaterals. The patient underwent primary PCI receiving 3.0 x 8 mm Onyx frontier drug-eluting stent ostial LAD. Left ventriculography revealed fairly reduced left ventricular function with estimated LV ejection fraction of 25%.   Post cath he is being admitted to ICU.  Please see "Significant Hospital Events" section below for full detailed hospital course.   Pertinent  Medical History   Past Medical History:  Diagnosis Date   Acute kidney failure, unspecified (HCC)    Anxiety    Diabetes mellitus, type 2 (HCC)    GERD (gastroesophageal reflux disease)    History of arterial disease of lower extremity  History of hepatitis B    Hypertension    Hypothyroidism    Mitral valve disorders(424.0)    Pityriasis 12/27/2014   Primary pulmonary HTN (HCC)    Pt denies ever having.   Tricuspid valve disorders, specified as  nonrheumatic     Micro Data:  11/25: Urine>> 11/25: Blood cultures x2>>  Antimicrobials:   Anti-infectives (From admission, onward)    Start     Dose/Rate Route Frequency Ordered Stop   02/19/23 1400  cefTRIAXone (ROCEPHIN) 1 g in sodium chloride 0.9 % 100 mL IVPB        1 g 200 mL/hr over 30 Minutes Intravenous Every 24 hours 02/19/23 1042 02/20/23 2357   02/01/2023 1230  cefTRIAXone (ROCEPHIN) 1 g in sodium chloride 0.9 % 100 mL IVPB  Status:  Discontinued        1 g 200 mL/hr over 30 Minutes Intravenous Every 24 hours 02/12/2023 1131 02/19/23 1042       Significant Hospital Events: Including procedures, antibiotic start and stop dates in addition to other pertinent events   11/25: Out-of-Hospital Cardiac arrest requiring about 28 minutes of ACLS.  Underwent emergent Cath, stent placed to ostial LAD.  Upon arrival to ICU exhibiting myoclonus concerning for anoxic brain injury.  Neurology and Nephrology consulted. 11/26: Neuro exam and MRI Brain concerning for anoxic brain injury. Repeat EEG pending.  Consult Palliative Care to assist with goals of care. 11/27: Neuro exam remains poor.  Repeat EEG revealed no seizure activity but suggestive of profound diffuse encephalopathy.  Palliative Care Team met with pts family and code status changed to DNR with no escalation of care  11/28: Neuro exam remains poor despite weaning off versed/propofol gtts remains on fentanyl gtt @100  mcg/hr.  Now hypertensive sbp 170-190's prn hydralazine ordered for bp management   Interim History / Subjective:  As outlined above under significant events   Objective   Blood pressure (!) 193/60, pulse 96, temperature (!) 101.4 F (38.6 C), temperature source Axillary, resp. rate 20, height 5' 10.98" (1.803 m), weight 100.1 kg, SpO2 93%.    Vent Mode: PRVC FiO2 (%):  [30 %-40 %] 40 % Set Rate:  [20 bmp] 20 bmp Vt Set:  [500 mL] 500 mL PEEP:  [5 cmH20] 5 cmH20 Plateau Pressure:  [8 cmH20-16 cmH20] 8 cmH20    Intake/Output Summary (Last 24 hours) at 02/09/2023 2130 Last data filed at 02/12/2023 0600 Gross per 24 hour  Intake 1521.48 ml  Output 2595 ml  Net -1073.52 ml   Filed Weights   02/20/23 0500 02/20/23 1412 02/20/23 1550  Weight: 102.1 kg 102 kg 100.1 kg    Examination: General: Acutely-ill appearing male, NAD mechanically intubated  HENT: Supple, no JVD  Lungs: Clear throughout, even, non labored  Cardiovascular: Sinus tachycardia, s1s2, no m/r/g, 2+ radial/2+ distal pulses, no edema  Abdomen: +BS x4, soft, non distended  Extremities: Normal bulk and tone Neuro: Unresponsive, no brainstem reflexes (absent cough/gag and corneal reflexes), is on heavy sedation for myoclonus, left pupil pinpoint and nonreactive, right pupil ovoid 3 mm and nonreactive GU: Indwelling foley catheter present   Resolved Hospital Problem list     Assessment & Plan:   #Acute Metabolic Encephalopathy #Myoclonus #Concern for Anoxic Brain Injury #Cocaine intoxication #Sedation needs in setting of mechanical ventilation CT Head 11/26: CT head: Limited study due to presence of contrast from cardiac catheterization performed earlier today. Hypodense focus in the left side of the pons and left thalamus, concerning for infarct versus  artifact. Correlation with MRI of the brain recommended.  MRI Brain 11/26: Concerning for widespread anoxic brain injury, remote lacunar infarcts at the left thalamus and pons, few small foci of susceptibility artifact of the left frontoparietal region (no hemorrhage, but exam limited due to presence of IV contrast) - Maintain Normothermia - Maintain a RASS goal of 0 to -1 - PAD protocol to maintain RASS goal and/or to terminate myoclonus: prn fentanyl, versed, and propofol - Avoid sedating medications as able - Daily WUA - Continue keppra  - Repeat EEG x 2 11/28: negative for seizure activity  - Will need repeat MRI Brain  - Neurology following, appreciate inpt: would like  to keep off sedatives if possible for prognostication on 11/28   #Shock: Cardiogenic  #Out-of-Hospital Cardiac Arrest #STEMI #Acute Decompensated HFrEF #HTN  S/p Cardiac Cath 11/25: Successful PCI with DES to ostial LAD, Residual 99% stenosis of OM2 which appeared chronic and collateralized, severe dilated cardiomyopathy with estimated LV 25% - Continuous telemetry monitoring - Cautious iv fluids and vasopressors as needed to maintain map 65 or higher - Lactic acid is normalized (5.3 ~ 1 ~ 1.5) - HS Troponin peaked at 532 - Echocardiogram results pending  - Cardiology following, appreciate input: prn hydralazine for bp control  - Volume removal with HD - Continue ASA and brilinta  #Acute Hypoxic Respiratory Failure due to STEMI and acute decompensated HFrEF - Full vent support, implement lung protective strategies - Plateau pressures less than 30 cm H20 - Wean FiO2 & PEEP as tolerated to maintain O2 sats >92% - Follow intermittent Chest X-ray & ABG as needed - Spontaneous Breathing Trials when respiratory parameters met and mental status permits - VAP bundle implemented  - Prn bronchodilator therapy  - Volume removal with HD  #ESRD on Hemodialysis #Hyponatremia #Hyperkalemia~resolved  PMHx: S/p Renal transplant - Trend BMP  - Replace electrolytes as indicated  - Strict intake and output  - Replace electrolytes as indicated ~ Pharmacy following for assistance with electrolyte replacement - Hold Cellcept for now - Nephrology consulted appreciate input: HD per recommendations   #Mild leukocytosis~improving likely reactive micro data negative thus far - Trend WBC and monitor fever curve - Follow culture results   #Type II diabetes mellitus  #Hypothyroidism - CBG's q4h - Target range of 140 to 180 - SSI - Follow ICU hypo/hyperglycemia protocol - Thyroid panel within normal limits   Patient is critically ill with multiorgan failure status post out-of-hospital cardiac  arrest and STEMI.  Prognosis is extremely guarded with high risk for further decompensation, cardiac arrest and death.  Patient also exhibiting myoclonus and with concern for anoxic brain injury.  Given current critical illness superimposed on multiple chronic comorbidities, overall long-term prognosis is extremely poor.  Recommend DNR/DNI status.  Consult palliative care consult to assist with goals of care conversations.  Best Practice (right click and "Reselect all SmartList Selections" daily)   Diet/type: Tube feeds DVT prophylaxis: prophylactic heparin  GI prophylaxis: PPI Lines: Central line, Arterial Line, and yes and it is still needed Foley:  Yes, and it is still needed Code Status: DO NOT RESUSCITATE  Last date of multidisciplinary goals of care discussion [02/05/2023]  11/28: Will update pts daughter when she arrives at bedside  Labs   CBC: Recent Labs  Lab 02/05/2023 0645 01/27/2023 0810 02/01/2023 1152 02/19/23 0358 02/20/23 0358 01/25/2023 0424  WBC 11.6*  --  19.8* 19.6* 13.1* 9.8  HGB 9.3* 10.9* 10.1* 9.2* 8.0* 8.1*  HCT 31.1* 32.0* 31.4*  27.6* 24.4* 25.6*  MCV 101.3*  --  94.3 91.7 93.5 96.2  PLT 181  --  224 177 152 154    Basic Metabolic Panel: Recent Labs  Lab 02/17/2023 0645 01/25/2023 0810 02/15/2023 2244 02/19/23 0217 02/19/23 0358 02/20/23 0358 01/25/2023 0424  NA 128*   < > 133* 131* 131* 132* 135  K 3.4*   < > 3.6 4.1 4.3 4.0 4.1  CL 93*   < > 97* 96* 96* 97* 97*  CO2 23   < > 26 23 23 24 26   GLUCOSE 621*   < > 128* 150* 161* 199* 270*  BUN 39*   < > 32* 35* 36* 47* 42*  CREATININE 4.36*   < > 2.98* 3.52* 3.63* 4.79* 3.99*  CALCIUM 9.7   < > 8.0* 8.0* 8.1* 7.9* 8.1*  MG 2.1  --   --   --  1.6* 2.0 2.1  PHOS  --   --   --   --  5.7* 6.4* 5.9*   < > = values in this interval not displayed.   GFR: Estimated Creatinine Clearance: 24.6 mL/min (A) (by C-G formula based on SCr of 3.99 mg/dL (H)). Recent Labs  Lab 01/25/2023 0645 01/30/2023 1152 02/06/2023 1310  02/19/23 0358 02/20/23 0358 02/08/2023 0424  WBC 11.6* 19.8*  --  19.6* 13.1* 9.8  LATICACIDVEN 5.3* 1.0 1.5  --   --   --     Liver Function Tests: Recent Labs  Lab 02/17/2023 1152 02/19/23 0358 02/20/23 0358 02/13/2023 0424  AST 89* 35  --   --   ALT 97* 64*  --   --   ALKPHOS 124 94  --   --   BILITOT 0.6 0.6  --   --   PROT 5.0* 4.8*  --   --   ALBUMIN 2.8* 2.6*  2.7* 2.3* 2.4*   No results for input(s): "LIPASE", "AMYLASE" in the last 168 hours. Recent Labs  Lab 02/20/23 1520  AMMONIA 15    ABG    Component Value Date/Time   PHART 7.36 02/19/2023 0500   PCO2ART 39 02/19/2023 0500   PO2ART 159 (H) 02/19/2023 0500   HCO3 22.0 02/19/2023 0500   TCO2 27 02/23/2023 0810   ACIDBASEDEF 3.1 (H) 02/19/2023 0500   O2SAT 99.5 02/19/2023 0500     Coagulation Profile: No results for input(s): "INR", "PROTIME" in the last 168 hours.  Cardiac Enzymes: No results for input(s): "CKTOTAL", "CKMB", "CKMBINDEX", "TROPONINI" in the last 168 hours.  HbA1C: Hemoglobin A1C  Date/Time Value Ref Range Status  08/08/2022 12:00 AM 7.5  Final    Comment:    O'Connell  11/28/2021 12:00 AM 6.9  Final   Hgb A1c MFr Bld  Date/Time Value Ref Range Status  02/17/2023 11:52 AM 6.7 (H) 4.8 - 5.6 % Final    Comment:    (NOTE) Pre diabetes:          5.7%-6.4%  Diabetes:              >6.4%  Glycemic control for   <7.0% adults with diabetes     CBG: Recent Labs  Lab 02/20/23 1517 02/20/23 1933 02/20/23 2338 02/10/2023 0330 01/25/2023 0727  GLUCAP 173* 212* 254* 262* 293*    Review of Systems:   Unable to assess due to intubation/sedation/critical illness  Past Medical History:  He,  has a past medical history of Acute kidney failure, unspecified (HCC), Anxiety, Diabetes mellitus, type 2 (HCC), GERD (gastroesophageal reflux  disease), History of arterial disease of lower extremity, History of hepatitis B, Hypertension, Hypothyroidism, Mitral valve disorders(424.0), Pityriasis  (12/27/2014), Primary pulmonary HTN (HCC), and Tricuspid valve disorders, specified as nonrheumatic.   Surgical History:   Past Surgical History:  Procedure Laterality Date   APPENDECTOMY     Carotid Doppler Ultrasound  06/14/2009   39% stenosis of bilateral internal carotid artery, bilateral anterograde vertebral flow   CORONARY/GRAFT ACUTE MI REVASCULARIZATION N/A 02/15/2023   Procedure: Coronary/Graft Acute MI Revascularization;  Surgeon: Marcina Millard, MD;  Location: [facility] INVASIVE CV LAB;  Service: Cardiovascular;  Laterality: N/A;   DIALYSIS/PERMA CATHETER INSERTION N/A 02/04/2023   Procedure: DIALYSIS/PERMA CATHETER INSERTION;  Surgeon: Annice Needy, MD;  Location: [facility] INVASIVE CV LAB;  Service: Cardiovascular;  Laterality: N/A;   ESOPHAGOGASTRODUODENOSCOPY (EGD) WITH PROPOFOL N/A 01/21/2019   Procedure: ESOPHAGOGASTRODUODENOSCOPY (EGD) WITH PROPOFOL;  Surgeon: Toney Reil, MD;  Location: [facility];  Service: Endoscopy;  Laterality: N/A;  Diabetic - insulin   EYE SURGERY     right   HEMORROIDECTOMY     KIDNEY TRANSPLANT Right 2016   LEFT HEART CATH AND CORONARY ANGIOGRAPHY N/A 02/05/2023   Procedure: LEFT HEART CATH AND CORONARY ANGIOGRAPHY;  Surgeon: Marcina Millard, MD;  Location: [facility] INVASIVE CV LAB;  Service: Cardiovascular;  Laterality: N/A;   MECKEL DIVERTICULUM EXCISION     infancy   Myocardial Perfusion scan  01/17/2009   [facility], non- ischemic. LVEF= 55%   PARS PLANA VITRECTOMY Left 02/09/2015   Procedure: Pan retinal photocoagulation 40981;  Surgeon: Marcelene Butte, MD;  Location: [facility] ORS;  Service: Ophthalmology;  Laterality: Left;   REFRACTIVE SURGERY Left    sleep study  01/09/2011   Severe sleep apnea. AHI 72.9/hr. RDI=83.0/hr. Desaturation to 69.0% Emergency CPAP titaration to 14.0cm (01/14/19 resolved after wt loss after kidney transplant.)     Social History:   reports that he has been smoking cigarettes. He has a 41  pack-year smoking history. He has never used smokeless tobacco. He reports that he does not currently use alcohol. He reports that he does not use drugs.   Family History:  His family history includes Hyperlipidemia in his mother; Hypertension in his mother; Melanoma in his father.   Allergies Allergies  Allergen Reactions   No Known Allergies      Home Medications  Prior to Admission medications   Medication Sig Start Date End Date Taking? Authorizing Provider  alprazolam Prudy Feeler) 2 MG tablet TAKE 1 TABLET BY MOUTH THREE TIMES DAILY AS NEEDED FOR ANXIETY 01/21/23   [name], MD  amLODipine (NORVASC) 10 MG tablet Take 10 mg by mouth daily.  01/02/19   [provider]  atorvastatin (LIPITOR) 80 MG tablet Take 1 tablet (80 mg total) by mouth daily. 10/18/22   [name], MD  BD INSULIN SYRINGE U/F 31G X 5/16" 1 ML MISC  12/29/18   [provider]  calcitRIOL (ROCALTROL) 0.25 MCG capsule Take 1 capsule (0.25 mcg total) by mouth daily. 04/11/21   [name], MD  carvedilol (COREG) 12.5 MG tablet Take 1.5 tablets (18.75 mg total) by mouth 2 (two) times daily with a meal. 01/21/23   Gollan, Tollie Pizza, MD  chlorhexidine (PERIDEX) 0.12 % solution SMARTSIG:0.5 Capful(s) By Mouth Twice Daily Patient not taking: Reported on 02/04/2023 12/25/22   [provider]  cinacalcet (SENSIPAR) 30 MG tablet Take 30 mg by mouth daily. 11/22/22   [provider]  [name] 10 MG TABS tablet TAKE 1  TABLET BY MOUTH BEFORE BREAKFAST 10/23/22   [name], MD  insulin aspart (NOVOLOG) 100 UNIT/ML injection Inject 8 Units into the skin 3 (three) times daily before meals. and reports adjusting per sliding scale from Endocrinologist if blood sugar greater than 120 prior to mea    [provider]  insulin degludec (TRESIBA FLEXTOUCH) 100 UNIT/ML FlexTouch Pen Inject 39 Units into the skin daily. Patient receives via [name] Patient Assistance through Dec  2023 Patient taking differently: Inject 18 Units into the skin daily. Patient receives via [name] Patient Assistance through Dec 2023 07/17/22   [name], MD  losartan (COZAAR) 100 MG tablet Take 1 tablet (100 mg total) by mouth at bedtime. 04/11/21   [name], MD  mycophenolate (CELLCEPT) 500 MG tablet Take 1,000 mg by mouth 2 (two) times daily.    [provider]  naloxone [facility]) nasal spray 4 mg/0.1 mL Place 1 spray into the nose once.    [provider]  omeprazole (PRILOSEC) 20 MG capsule Take 20 mg by mouth daily as needed.    [provider]  oxyCODONE (OXY IR/ROXICODONE) 5 MG immediate release tablet Take 1-2 tablets (5-10 mg total) by mouth every 6 (six) hours as needed for severe pain (pain score 7-10). 02/05/23   [name], MD  Semaglutide,0.25 or 0.5MG /DOS, (OZEMPIC, 0.25 OR 0.5 MG/DOSE,) 2 MG/3ML SOPN Inject 0.5 mg into the skin once a week. Patient receives via [name] Patient Assistance Patient taking differently: Inject 1 mg into the skin once a week. Patient receives via [name] Patient Assistance 05/28/22   [name], MD  sildenafil (REVATIO) 20 MG tablet  05/26/21   [provider]  sildenafil (VIAGRA) 100 MG tablet Take 1 tab 1 hour prior to intercourse 10/31/22   Stoioff, Verna Czech, MD  tacrolimus (PROGRAF) 1 MG capsule Take 3 mg by mouth 2 (two) times daily.  01/11/15   [provider]  tamsulosin (FLOMAX) 0.4 MG CAPS capsule Take 1 capsule (0.4 mg total) by mouth daily. 10/18/22   [name], MD  tretinoin (RETIN-A) 0.05 % cream Apply topically as needed.     [provider]  triamcinolone cream (KENALOG) 0.1 % APPLY DAILY TO INFLAMED BUMPS AS NEEDED 10/06/18   [provider]  XYOSTED 50 MG/0.5ML SOAJ  05/26/21   [provider]     Critical care time: 42 minutes     Zada Girt, AGNP  Pulmonary/Critical Care Pager [phone] (please enter 7 digits) PCCM  Consult Pager [phone] (please enter 7 digits)

## 2023-02-24 DEATH — deceased

## 2023-02-25 ENCOUNTER — Other Ambulatory Visit: Payer: Medicare HMO

## 2023-02-27 ENCOUNTER — Other Ambulatory Visit: Payer: Self-pay | Admitting: Pharmacist

## 2023-10-31 ENCOUNTER — Ambulatory Visit: Payer: Medicare HMO | Admitting: Urology
